# Patient Record
Sex: Male | Born: 1945
Health system: Southern US, Community
[De-identification: ages and names within clinical notes are randomized; demographics above are authoritative.]

## PROBLEM LIST (undated history)

## (undated) DIAGNOSIS — I499 Cardiac arrhythmia, unspecified: Secondary | ICD-10-CM

## (undated) DIAGNOSIS — C801 Malignant (primary) neoplasm, unspecified: Secondary | ICD-10-CM

## (undated) DIAGNOSIS — I350 Nonrheumatic aortic (valve) stenosis: Secondary | ICD-10-CM

## (undated) DIAGNOSIS — Z720 Tobacco use: Secondary | ICD-10-CM

## (undated) DIAGNOSIS — I739 Peripheral vascular disease, unspecified: Secondary | ICD-10-CM

## (undated) DIAGNOSIS — M1990 Unspecified osteoarthritis, unspecified site: Secondary | ICD-10-CM

## (undated) DIAGNOSIS — R011 Cardiac murmur, unspecified: Secondary | ICD-10-CM

## (undated) DIAGNOSIS — I714 Abdominal aortic aneurysm, without rupture, unspecified: Secondary | ICD-10-CM

## (undated) DIAGNOSIS — I480 Paroxysmal atrial fibrillation: Secondary | ICD-10-CM

## (undated) HISTORY — PX: CERVICAL SPINE SURGERY: SHX589

## (undated) HISTORY — DX: Tobacco use: Z72.0

## (undated) HISTORY — DX: Paroxysmal atrial fibrillation: I48.0

## (undated) HISTORY — PX: BACK SURGERY: SHX140

## (undated) HISTORY — PX: EYE SURGERY: SHX253

## (undated) HISTORY — DX: Nonrheumatic aortic (valve) stenosis: I35.0

## (undated) HISTORY — PX: RETINAL DETACHMENT SURGERY: SHX105

## (undated) HISTORY — DX: Peripheral vascular disease, unspecified: I73.9

## (undated) HISTORY — PX: LEG SURGERY: SHX1003

## (undated) HISTORY — DX: Abdominal aortic aneurysm, without rupture, unspecified: I71.40

## (undated) NOTE — *Deleted (*Deleted)
Up and walked and tolerated well 

---

## 2004-04-04 ENCOUNTER — Ambulatory Visit (HOSPITAL_COMMUNITY): Admission: RE | Admit: 2004-04-04 | Discharge: 2004-04-05 | Payer: Self-pay | Admitting: Ophthalmology

## 2004-05-10 ENCOUNTER — Ambulatory Visit (HOSPITAL_COMMUNITY): Admission: RE | Admit: 2004-05-10 | Discharge: 2004-05-10 | Payer: Self-pay | Admitting: Neurosurgery

## 2009-07-14 ENCOUNTER — Ambulatory Visit: Payer: Self-pay | Admitting: Family Medicine

## 2009-07-14 DIAGNOSIS — R9431 Abnormal electrocardiogram [ECG] [EKG]: Secondary | ICD-10-CM | POA: Insufficient documentation

## 2009-08-15 ENCOUNTER — Telehealth (INDEPENDENT_AMBULATORY_CARE_PROVIDER_SITE_OTHER): Payer: Self-pay | Admitting: *Deleted

## 2009-08-16 ENCOUNTER — Encounter (HOSPITAL_COMMUNITY): Admission: RE | Admit: 2009-08-16 | Discharge: 2009-10-10 | Payer: Self-pay | Admitting: Family Medicine

## 2009-08-16 ENCOUNTER — Ambulatory Visit: Payer: Self-pay | Admitting: Internal Medicine

## 2009-08-16 ENCOUNTER — Ambulatory Visit: Payer: Self-pay

## 2010-09-03 LAB — CONVERTED CEMR LAB
BUN: 10 mg/dL (ref 6–23)
CO2: 31 meq/L (ref 19–32)
Calcium: 9 mg/dL (ref 8.4–10.5)
Chloride: 107 meq/L (ref 96–112)
Cholesterol: 190 mg/dL (ref 0–200)
Creatinine, Ser: 1 mg/dL (ref 0.4–1.5)
GFR calc non Af Amer: 79.6 mL/min (ref >60)
Glucose, Bld: 104 mg/dL — ABNORMAL HIGH (ref 70–99)
HDL: 38.8 mg/dL — ABNORMAL LOW (ref >39.00)
LDL Cholesterol: 136 mg/dL — ABNORMAL HIGH (ref 0–99)
PSA: 0.97 ng/mL (ref 0.10–4.00)
Potassium: 4.9 meq/L (ref 3.5–5.1)
Sodium: 142 meq/L (ref 135–145)
Total CHOL/HDL Ratio: 5
Triglycerides: 74 mg/dL (ref 0.0–149.0)
VLDL: 14.8 mg/dL (ref 0.0–40.0)

## 2010-09-06 NOTE — Progress Notes (Signed)
Summary: Nuclear Pre-Procedure  Phone Note Outgoing Call Call back at [facility] Phone [phone]   Call placed by: Stanton Kidney, EMT-P,  August 15, 2009 2:17 PM Action Taken: Phone Call Completed Summary of Call: Left message with information on Myoview Information Sheet (see scanned document for details).      Nuclear Med Background Indications for Stress Test: Evaluation for Ischemia, Abnormal EKG        Nuclear Pre-Procedure Cardiac Risk Factors: Smoker Height (in): 74.34

## 2010-09-06 NOTE — Assessment & Plan Note (Signed)
Summary: Cardiology Nuclear Study  Nuclear Med Background Indications for Stress Test: Evaluation for Ischemia, Abnormal EKG     Symptoms: Chest Pressure, Palpitations  Symptoms Comments: Last Chest pressure yesterday.   Nuclear Pre-Procedure Cardiac Risk Factors: Smoker Caffeine/Decaff Intake: None NPO After: 12:00 AM Lungs: Clear IV 0.9% NS with Angio Cath: 20g     IV Site: (R) AC IV Started by: Irean Hong RN Chest Size (in) 44     Height (in): 75.5 Weight (lb): 218 BMI: 26.99  Nuclear Med Study 1 or 2 day study:  1 day     Stress Test Type:  Stress Reading MD:  Arvilla Meres, MD     Referring MD:  Evelena Peat Resting Radionuclide:  Technetium 82m Tetrofosmin     Resting Radionuclide Dose:  10.9 mCi  Stress Radionuclide:  Technetium 68m Tetrofosmin     Stress Radionuclide Dose:  33.0 mCi   Stress Protocol Exercise Time (min):  6:00 min     Max HR:  148 bpm     Predicted Max HR:  155 bpm  Max Systolic BP: 198 mm Hg     Percent Max HR:  95.48 %     METS: 7.0 Rate Pressure Product:  16109    Stress Test Technologist:  Irean Hong RN     Nuclear Technologist:  [name] CNMT  Rest Procedure  Myocardial perfusion imaging was performed at rest 45 minutes following the intravenous administration of Myoview Technetium 25m Tetrofosmin.  Stress Procedure  The patient exercised for six minutes.  The patient stopped due to DOE, marked fatigue, and denied any chest pain.  There were no significant ST-T wave changes, rare PVC,PJC.  Myoview was injected at peak exercise and myocardial perfusion imaging was performed after a brief delay.  QPS Raw Data Images:  Normal; no motion artifact; normal heart/lung ratio. Stress Images:  Mildly decreased uptake in inferior wall Rest Images:  Mildly decreased uptake in inferior wall Subtraction (SDS):  Mild fixed defect in inferior wall likely diaphragmatic attenutation Transient Ischemic Dilatation:  .92  (Normal <1.22)  Lung/Heart Ratio:  .31  (Normal <0.45)  Quantitative Gated Spect Images QGS EDV:  120 ml QGS ESV:  44 ml QGS EF:  63 % QGS cine images:  normal  Findings Normal nuclear study      Overall Impression  Exercise Capacity: Fair exercise capacity. BP Response: Hypertensive blood pressure response. Clinical Symptoms: There is dyspnea. ECG Impression: No significant ST segment change suggestive of ischemia. Overall Impression: Normal stress nuclear study. Overall Impression Comments: Mildly decreased uptake in inferior wall likely diaphragmatic attenuation. No ischemia.  Appended Document: Cardiology Nuclear Study pt notified of neg stress test.  He is doing well at this time and aymptomatic.

## 2010-12-22 NOTE — Op Note (Signed)
Cory [name], Cory [name]                 ACCOUNT NO.:  [id number]   MEDICAL RECORD NO.:  [id number]          PATIENT TYPE:  OIB   LOCATION:  2889                         FACILITY:  [facility]   PHYSICIAN:  Coletta Memos, M.D.     DATE OF BIRTH:  06-Feb-1946   DATE OF PROCEDURE:  05/10/2004  DATE OF DISCHARGE:                                 OPERATIVE REPORT   PREOPERATIVE DIAGNOSES:  1.  Cervical spondylosis, C5-6, C6-7.  2.  Cervical radiculopathy, C7, C6 left side.  3.  Neck pain.   POSTOPERATIVE DIAGNOSES:  1.  Cervical spondylosis, C5-6, C6-7.  2.  Cervical radiculopathy, C7, C6 left side.  3.  Neck pain.   OPERATION PERFORMED:  Anterior cervical decompression, C5-6, C6-7; anterior  arthrodesis, C5 to C7 with 6 mm allograft between C5 and C6, 8 mm allograft  between C6 and 7.  Anterior instrumentation, 34 mm Vectra plate.   SURGEON:  Coletta Memos, M.D.   ASSISTANT:  Clydene Fake, M.D.   ANESTHESIA:  General.   COMPLICATIONS:  None.   INDICATIONS FOR PROCEDURE:  Cory [name] is a 65 year old who presented with  weakness in the left triceps.  MRI showed significant foraminal stenosis on  the left side at 5-6 and 6-7.  I recommended that he undergo a two-level  anterior cervical decompression.  The patient agreed.   DESCRIPTION OF PROCEDURE:  Cory [name] was brought to the operating room,  intubated, and placed under general anesthetic without difficulty.  He was  positioned on a horseshoe head rest and 10 pounds of tractions applied via  chin strap.  His neck was prepped and he was draped in a sterile fashion.  I  infiltrated 2 mL 0.5% lidocaine, starting at the midline, extending to the  medial border of the left sternocleidomastoid just below the thyroid  cartilage.  I opened the skin with a #10 blade and I took this initial  incision down to the level of the platysma.  I dissected rostrally and  caudally above the platysma.  I opened the platysma in a horizontal fashion  and  then dissected caudally inferior to the platysma.  I cauterized and  ligated some veins which were overlying my exposure.  Then using a peanut  and hand held retractor, I was able to identify the anterior cervical spine.  He had an impressive large osteophyte at C5-6 which I did take down using a  Leksell rongeur.  I then reflected the longus coli muscles bilaterally from  C5 to C7.  I placed self-retaining Caspar retractor and then placed two  distraction pins, one in C5, the other in C6.  I used a 15 blade and opened  the disk space and then using curettes, pituitary rongeurs and Kerrison  punches, proceeded with a progressive diskectomy.  After the disk was  removed, I used a high speed drill to drill down what were fairly large  osteophytes on the right and left sides.  After I had fully decompressed  both nerve roots and sewed the takeoffs easily, I then placed a 6 mm piece  of bone into the disk space after shaping the edges of the disk space to fit  the bone.  I then removed the distraction pin from C5 and retractor blades.  I reset the apparatus to expose C6-7 and I then placed distraction pin in  C7.  I distracted the disk space and again using a 15 blade, opened the disk  space and removed some of the superficial disk material.  However, there was  significant osteophyte off of the inferior aspect of C6 and using a curette,  I was able to remove that, exposing more of the disk space as a whole.  I  did use the drill extensively at C6-7 level as there was a large amount of  bony encroachment on the neural foramen at 6-7 especially on the left side.  The right side it was present also but I was able to fully decompress that  nerve root as I did on the left side. I did leave overlying soft tissue as  it appeared to contain a fair number of epidural veins and I was not able to  detect any pressure on the nerve.  Dr. Phoebe Perch assisted with the  decompression.  I then placed an 8 mm piece  of bone between C6-7.  I removed  the distraction pins.  I then removed the weight from the neck.  A plate was  then sized and placed, two screws in C5, two in C6, two in C7 without  difficulty.  Each screw was placed first by drilling a hole and using self-  tapping screws.  I irrigated the wound.  Controlled some bleeding with  Gelfoam.  X-ray showed the plate, screws and plug to be in good position.  I  then closed the wound with Dr. Doreen Beam assistance.  I reapproximated the  platysma and subcutaneous tissues.  Dermabond was used for a sterile  dressing.  The patient tolerated the procedure well.       KC/MEDQ  D:  05/10/2004  T:  05/10/2004  Job:  [id number]

## 2013-04-01 ENCOUNTER — Ambulatory Visit (INDEPENDENT_AMBULATORY_CARE_PROVIDER_SITE_OTHER): Payer: Self-pay | Admitting: Family Medicine

## 2013-04-01 ENCOUNTER — Encounter: Payer: Self-pay | Admitting: Family Medicine

## 2013-04-01 VITALS — BP 142/82 | HR 81 | Temp 97.9°F | Wt 219.0 lb

## 2013-04-01 DIAGNOSIS — Z23 Encounter for immunization: Secondary | ICD-10-CM

## 2013-04-01 DIAGNOSIS — R0789 Other chest pain: Secondary | ICD-10-CM

## 2013-04-01 DIAGNOSIS — R1013 Epigastric pain: Secondary | ICD-10-CM

## 2013-04-01 LAB — CBC WITH DIFFERENTIAL/PLATELET
Basophils Absolute: 0 10*3/uL (ref 0.0–0.1)
Basophils Relative: 0.5 % (ref 0.0–3.0)
Eosinophils Absolute: 0.2 10*3/uL (ref 0.0–0.7)
Eosinophils Relative: 1.9 % (ref 0.0–5.0)
HCT: 48.1 % (ref 39.0–52.0)
Hemoglobin: 16.5 g/dL (ref 13.0–17.0)
Lymphocytes Relative: 26.9 % (ref 12.0–46.0)
Lymphs Abs: 2.5 10*3/uL (ref 0.7–4.0)
MCHC: 34.3 g/dL (ref 30.0–36.0)
MCV: 94 fl (ref 78.0–100.0)
Monocytes Absolute: 0.7 10*3/uL (ref 0.1–1.0)
Monocytes Relative: 7.5 % (ref 3.0–12.0)
Neutro Abs: 5.8 10*3/uL (ref 1.4–7.7)
Neutrophils Relative %: 63.2 % (ref 43.0–77.0)
Platelets: 216 10*3/uL (ref 150.0–400.0)
RBC: 5.12 Mil/uL (ref 4.22–5.81)
RDW: 13.5 % (ref 11.5–14.6)
WBC: 9.2 10*3/uL (ref 4.5–10.5)

## 2013-04-01 NOTE — Addendum Note (Signed)
Addended by: Thomasena Edis on: 04/01/2013 01:11 PM   Modules accepted: Orders

## 2013-04-01 NOTE — Progress Notes (Signed)
  Subjective:    Patient ID: Cory [name], male    DOB: 1945-09-12, 67 y.o.   MRN: [id number]  HPI Patient not been seen in several years. Is a one pack per day cigarette smoker seen with approximately 2 week history of some vague epigastric discomfort Symptoms are intermittent. He describes some intermittent gaseous discomfort and intermittent burping. No clear exacerbating factors. Not related activity. He has occasional chest pressure but not related to activity. Nuclear stress test 2011 which was normal. No cardiac history. No dysphagia  He relates normal appetite and no relation of symptoms to eating. No stool changes. No nausea or vomiting. No weight changes. Denies any typical GERD symptoms. No cough. No pleuritic pain.  Patient has history of low HDL. No history of diabetes. No history of hypertension. No family history of premature CAD.  No past medical history on file. No past surgical history on file.  reports that he has been smoking.  He does not have any smokeless tobacco history on file. He reports that  drinks alcohol. He reports that he does not use illicit drugs. family history is not on file. No Known Allergies    Review of Systems  Constitutional: Negative for fever, chills, appetite change, fatigue and unexpected weight change.  HENT: Negative for trouble swallowing.   Respiratory: Negative for cough and shortness of breath.   Cardiovascular: Negative for leg swelling.  Gastrointestinal: Positive for abdominal pain. Negative for nausea, vomiting, diarrhea and blood in stool.  Endocrine: Negative for polydipsia and polyuria.  Genitourinary: Negative for dysuria.  Neurological: Negative for dizziness.       Objective:   Physical Exam  Constitutional: He appears well-developed and well-nourished. No distress.  HENT:  Mouth/Throat: Oropharynx is clear and moist.  Cardiovascular: Normal rate and regular rhythm.   Pulmonary/Chest: Effort normal and breath sounds  normal. No respiratory distress. He has no wheezes. He has no rales.  Abdominal: Soft. Bowel sounds are normal. He exhibits no distension and no mass. There is no tenderness. There is no rebound and no guarding.  Genitourinary:  No inguinal hernia  Musculoskeletal: He exhibits no edema.          Assessment & Plan:  Patient presents with vague epigastric discomfort. No clear exacerbating factors. He has not had any dyspnea or exertional-type symptoms but at high risk for CAD. Nuclear stress test 2011 normal. Symptoms are not typical for GERD. Check EKG. Check lab work. Abdominal ultrasound. Consider upper GI if all above normal.  EKG reveals sinus rhythm with no acute changes

## 2013-04-01 NOTE — Patient Instructions (Addendum)
Smoking Cessation Quitting smoking is important to your health and has many advantages. However, it is not always easy to quit since nicotine is a very addictive drug. Often times, people try 3 times or more before being able to quit. This document explains the best ways for you to prepare to quit smoking. Quitting takes hard work and a lot of effort, but you can do it. ADVANTAGES OF QUITTING SMOKING  You will live longer, feel better, and live better.  Your body will feel the impact of quitting smoking almost immediately.  Within 20 minutes, blood pressure decreases. Your pulse returns to its normal level.  After 8 hours, carbon monoxide levels in the blood return to normal. Your oxygen level increases.  After 24 hours, the chance of having a heart attack starts to decrease. Your breath, hair, and body stop smelling like smoke.  After 48 hours, damaged nerve endings begin to recover. Your sense of taste and smell improve.  After 72 hours, the body is virtually free of nicotine. Your bronchial tubes relax and breathing becomes easier.  After 2 to 12 weeks, lungs can hold more air. Exercise becomes easier and circulation improves.  The risk of having a heart attack, stroke, cancer, or lung disease is greatly reduced.  After 1 year, the risk of coronary heart disease is cut in half.  After 5 years, the risk of stroke falls to the same as a nonsmoker.  After 10 years, the risk of lung cancer is cut in half and the risk of other cancers decreases significantly.  After 15 years, the risk of coronary heart disease drops, usually to the level of a nonsmoker.  If you are pregnant, quitting smoking will improve your chances of having a healthy baby.  The people you live with, especially any children, will be healthier.  You will have extra money to spend on things other than cigarettes. QUESTIONS TO THINK ABOUT BEFORE ATTEMPTING TO QUIT You may want to talk about your answers with  your caregiver.  Why do you want to quit?  If you tried to quit in the past, what helped and what did not?  What will be the most difficult situations for you after you quit? How will you plan to handle them?  Who can help you through the tough times? Your family? Friends? A caregiver?  What pleasures do you get from smoking? What ways can you still get pleasure if you quit? Here are some questions to ask your caregiver:  How can you help me to be successful at quitting?  What medicine do you think would be best for me and how should I take it?  What should I do if I need more help?  What is smoking withdrawal like? How can I get information on withdrawal? GET READY  Set a quit date.  Change your environment by getting rid of all cigarettes, ashtrays, matches, and lighters in your home, car, or work. Do not let people smoke in your home.  Review your past attempts to quit. Think about what worked and what did not. GET SUPPORT AND ENCOURAGEMENT You have a better chance of being successful if you have help. You can get support in many ways.  Tell your family, friends, and co-workers that you are going to quit and need their support. Ask them not to smoke around you.  Get individual, group, or telephone counseling and support. Programs are available at [employer] and health centers. Call your local health  department for information about programs in your area.  Spiritual beliefs and practices may help some smokers quit.  Download a "quit meter" on your computer to keep track of quit statistics, such as how long you have gone without smoking, cigarettes not smoked, and money saved.  Get a self-help book about quitting smoking and staying off of tobacco. LEARN NEW SKILLS AND BEHAVIORS  Distract yourself from urges to smoke. Talk to someone, go for a walk, or occupy your time with a task.  Change your normal routine. Take a different route to work. Drink tea instead of  coffee. Eat breakfast in a different place.  Reduce your stress. Take a hot bath, exercise, or read a book.  Plan something enjoyable to do every day. Reward yourself for not smoking.  Explore interactive web-based programs that specialize in helping you quit. GET MEDICINE AND USE IT CORRECTLY Medicines can help you stop smoking and decrease the urge to smoke. Combining medicine with the above behavioral methods and support can greatly increase your chances of successfully quitting smoking.  Nicotine replacement therapy helps deliver nicotine to your body without the negative effects and risks of smoking. Nicotine replacement therapy includes nicotine gum, lozenges, inhalers, nasal sprays, and skin patches. Some may be available over-the-counter and others require a prescription.  Antidepressant medicine helps people abstain from smoking, but how this works is unknown. This medicine is available by prescription.  Nicotinic receptor partial agonist medicine simulates the effect of nicotine in your brain. This medicine is available by prescription. Ask your caregiver for advice about which medicines to use and how to use them based on your health history. Your caregiver will tell you what side effects to look out for if you choose to be on a medicine or therapy. Carefully read the information on the package. Do not use any other product containing nicotine while using a nicotine replacement product.  RELAPSE OR DIFFICULT SITUATIONS Most relapses occur within the first 3 months after quitting. Do not be discouraged if you start smoking again. Remember, most people try several times before finally quitting. You may have symptoms of withdrawal because your body is used to nicotine. You may crave cigarettes, be irritable, feel very hungry, cough often, get headaches, or have difficulty concentrating. The withdrawal symptoms are only temporary. They are strongest when you first quit, but they will go away  within 10 14 days. To reduce the chances of relapse, try to:  Avoid drinking alcohol. Drinking lowers your chances of successfully quitting.  Reduce the amount of caffeine you consume. Once you quit smoking, the amount of caffeine in your body increases and can give you symptoms, such as a rapid heartbeat, sweating, and anxiety.  Avoid smokers because they can make you want to smoke.  Do not let weight gain distract you. Many smokers will gain weight when they quit, usually less than 10 pounds. Eat a healthy diet and stay active. You can always lose the weight gained after you quit.  Find ways to improve your mood other than smoking. FOR MORE INFORMATION  www.smokefree.gov  Document Released: 07/17/2001 Document Revised: 01/22/2012 Document Reviewed: 11/01/2011 [facility] Patient Information 2014 [place], [place].  Followup promptly for exertional chest pain, increased shortness of breath, or worsening abdominal pain

## 2013-04-02 LAB — HEPATIC FUNCTION PANEL
ALT: 18 U/L (ref 0–53)
AST: 17 U/L (ref 0–37)
Albumin: 3.9 g/dL (ref 3.5–5.2)
Alkaline Phosphatase: 39 U/L (ref 39–117)
Bilirubin, Direct: 0.1 mg/dL (ref 0.0–0.3)
Total Bilirubin: 0.5 mg/dL (ref 0.3–1.2)
Total Protein: 6.8 g/dL (ref 6.0–8.3)

## 2013-04-02 LAB — BASIC METABOLIC PANEL
BUN: 12 mg/dL (ref 6–23)
CO2: 25 mEq/L (ref 19–32)
Calcium: 8.7 mg/dL (ref 8.4–10.5)
Chloride: 104 mEq/L (ref 96–112)
Creatinine, Ser: 0.9 mg/dL (ref 0.4–1.5)
GFR: 87.77 mL/min (ref >60.00)
Glucose, Bld: 94 mg/dL (ref 70–99)
Potassium: 3.7 mEq/L (ref 3.5–5.1)
Sodium: 137 mEq/L (ref 135–145)

## 2013-04-02 LAB — LIPASE: Lipase: 28 U/L (ref 11.0–59.0)

## 2013-04-09 ENCOUNTER — Telehealth: Payer: Self-pay

## 2013-04-09 ENCOUNTER — Ambulatory Visit
Admission: RE | Admit: 2013-04-09 | Discharge: 2013-04-09 | Disposition: A | Discharge status: Home / Self Care | Payer: Medicare Other | Source: Ambulatory Visit | Attending: Family Medicine | Admitting: Family Medicine

## 2013-04-09 DIAGNOSIS — R1013 Epigastric pain: Secondary | ICD-10-CM

## 2013-04-09 NOTE — Telephone Encounter (Signed)
Pt stated that he is doing fine and he hasn't been having any pain.

## 2014-04-01 ENCOUNTER — Ambulatory Visit (INDEPENDENT_AMBULATORY_CARE_PROVIDER_SITE_OTHER): Payer: Commercial Managed Care - HMO | Admitting: Family Medicine

## 2014-04-01 ENCOUNTER — Encounter: Payer: Self-pay | Admitting: Family Medicine

## 2014-04-01 VITALS — BP 138/84 | HR 74 | Temp 98.0°F | Wt 224.0 lb

## 2014-04-01 DIAGNOSIS — M170 Bilateral primary osteoarthritis of knee: Secondary | ICD-10-CM

## 2014-04-01 DIAGNOSIS — M1611 Unilateral primary osteoarthritis, right hip: Secondary | ICD-10-CM

## 2014-04-01 DIAGNOSIS — M1710 Unilateral primary osteoarthritis, unspecified knee: Secondary | ICD-10-CM

## 2014-04-01 DIAGNOSIS — M1610 Unilateral primary osteoarthritis, unspecified hip: Secondary | ICD-10-CM

## 2014-04-01 MED ORDER — MELOXICAM 15 MG PO TABS: 15.0000 mg | ORAL_TABLET | Freq: Every day | ORAL | Status: DC

## 2014-04-01 NOTE — Progress Notes (Signed)
   Subjective:    Patient ID: Cory [name], male    DOB: 26-Sep-1945, 68 y.o.   MRN: [id number]  Hip Pain   Knee Pain    Progressive right hip and bilateral knee pains over the past several months. His pain is worse at night. He has early morning stiffness which improves through the day. He's taken Tylenol without much relief. He has recently been taking lots of ibuprofen which does help somewhat. No recent injury. He has not seen any knee effusions. No fevers or chills. No recent injury. Pain is moderate at night Denies any significant upper extremity arthritic pains  No past medical history on file. No past surgical history on file.  reports that he has been smoking.  He does not have any smokeless tobacco history on file. He reports that he drinks alcohol. He reports that he does not use illicit drugs. family history is not on file. No Known Allergies    Review of Systems  Constitutional: Negative for fever and chills.  Musculoskeletal: Positive for arthralgias. Negative for back pain and myalgias.  Skin: Negative for rash.  Hematological: Negative for adenopathy.       Objective:   Physical Exam  Constitutional: He appears well-developed and well-nourished. No distress.  Neck: Neck supple. No thyromegaly present.  Cardiovascular: Normal rate and regular rhythm.   Pulmonary/Chest: Effort normal and breath sounds normal. No respiratory distress. He has no wheezes. He has no rales.  Musculoskeletal: He exhibits no edema.  He has fairly good range of motion right hip as well as both knees. has mild crepitus with flexion and extension of both knees. No right lateral hip tenderness.          Assessment & Plan:  Probable osteoarthritis involving right hip and both knees. We discussed appropriate exercises as tolerated such as cycling. Cautious trial of meloxicam 15 mg once daily. We reviewed possible side effects. Consider x-rays right hip and knee if not improving over the  next few weeks. Also discussed other possible alternatives such as tramadol which he will consider if not getting adequate relief with meloxicam

## 2014-04-01 NOTE — Patient Instructions (Signed)

## 2014-04-01 NOTE — Progress Notes (Signed)
Pre visit review using our clinic review tool, if applicable. No additional management support is needed unless otherwise documented below in the visit note. 

## 2014-06-11 ENCOUNTER — Ambulatory Visit (INDEPENDENT_AMBULATORY_CARE_PROVIDER_SITE_OTHER): Payer: Commercial Managed Care - HMO | Admitting: Family Medicine

## 2014-06-11 ENCOUNTER — Encounter: Payer: Self-pay | Admitting: Family Medicine

## 2014-06-11 DIAGNOSIS — M7071 Other bursitis of hip, right hip: Secondary | ICD-10-CM

## 2014-06-11 MED ORDER — METHYLPREDNISOLONE ACETATE 40 MG/ML IJ SUSP
20.0000 mg | Freq: Once | INTRAMUSCULAR | Status: AC
  Administered 2014-06-11: 20 mg via INTRA_ARTICULAR

## 2014-06-11 NOTE — Progress Notes (Signed)
   Subjective:    Patient ID: Cory [name], male    DOB: [birth year], 68 y.o.   MRN: [id number]  HPI Patient seen with some persistent right lateral hip pains. No recent fall. Has been taking meloxicam past couple months without much improvement. Heat helps temporarily. He denies any lumbar back pain. Pain radiates slightly toward the mid aspect of the thigh laterally. Has never had any rashes. No numbness or weakness lower extremity. No right medial thigh pain.  No past medical history on file. No past surgical history on file.  reports that he has been smoking.  He does not have any smokeless tobacco history on file. He reports that he drinks alcohol. He reports that he does not use illicit drugs. family history is not on file. No Known Allergies    Review of Systems  Constitutional: Negative for fever and chills.       Objective:   Physical Exam  Constitutional: He appears well-developed and well-nourished.  Cardiovascular: Normal rate and regular rhythm.   Musculoskeletal:  He continues to have good range of motion right hip. There some tenderness localized over the greater trochanteric bursa region. Straight leg raise is negative. Deep tendon reflexes are 2+ knee and ankle bilaterally. No strength deficits lower extremity          Assessment & Plan:  Right greater trochanteric bursitis. He has not responded to non-steroidals. Recommend icing and discussed risk and benefits of corticosteroid injection and patient consented.  Right lateral hip prepped with betadine.  Using sterile technique, injected 40 mg depomedrol and 2 cc plain xylocaine using 25 gauge 1 inch needle into bursa region.  Pt tolerated well.

## 2014-06-11 NOTE — Patient Instructions (Signed)
Hip Bursitis Bursitis is a swelling and soreness (inflammation) of a fluid-filled sac (bursa). This sac overlies and protects the joints.  CAUSES   Injury.  Overuse of the muscles surrounding the joint.  Arthritis.  Gout.  Infection.  Cold weather.  Inadequate warm-up and conditioning prior to activities. The cause may not be known.  SYMPTOMS   Mild to severe irritation.  Tenderness and swelling over the outside of the hip.  Pain with motion of the hip.  If the bursa becomes infected, a fever may be present. Redness, tenderness, and warmth will develop over the hip. Symptoms usually lessen in 3 to 4 weeks with treatment, but can come back. TREATMENT If conservative treatment does not work, your caregiver may advise draining the bursa and injecting cortisone into the area. This may speed up the healing process. This may also be used as an initial treatment of choice. HOME CARE INSTRUCTIONS   Apply ice to the affected area for 15-20 minutes every 3 to 4 hours while awake for the first 2 days. Put the ice in a plastic bag and place a towel between the bag of ice and your skin.  Rest the painful joint as much as possible, but continue to put the joint through a normal range of motion at least 4 times per day. When the pain lessens, begin normal, slow movements and usual activities to help prevent stiffness of the hip.  Only take over-the-counter or prescription medicines for pain, discomfort, or fever as directed by your caregiver.  Use crutches to limit weight bearing on the hip joint, if advised.  Elevate your painful hip to reduce swelling. Use pillows for propping and cushioning your legs and hips.  Gentle massage may provide comfort and decrease swelling. SEEK IMMEDIATE MEDICAL CARE IF:   Your pain increases even during treatment, or you are not improving.  You have a fever.  You have heat and inflammation over the involved bursa.  You have any other questions or  concerns. MAKE SURE YOU:   Understand these instructions.  Will watch your condition.  Will get help right away if you are not doing well or get worse. Document Released: 01/12/2002 Document Revised: 10/15/2011 Document Reviewed: 08/11/2008 [employer] Patient Information 2015 [employer]. This information is not intended to replace advice given to you by your health care provider. Make sure you discuss any questions you have with your health care provider.  

## 2014-06-11 NOTE — Progress Notes (Signed)
Pre visit review using our clinic review tool, if applicable. No additional management support is needed unless otherwise documented below in the visit note. 

## 2014-06-14 ENCOUNTER — Telehealth: Payer: Self-pay | Admitting: Family Medicine

## 2014-06-14 NOTE — Telephone Encounter (Signed)
emmi mailed  °

## 2014-08-09 ENCOUNTER — Ambulatory Visit (INDEPENDENT_AMBULATORY_CARE_PROVIDER_SITE_OTHER): Payer: Commercial Managed Care - HMO | Admitting: Family Medicine

## 2014-08-09 ENCOUNTER — Encounter: Payer: Self-pay | Admitting: Family Medicine

## 2014-08-09 ENCOUNTER — Ambulatory Visit (INDEPENDENT_AMBULATORY_CARE_PROVIDER_SITE_OTHER): Payer: Commercial Managed Care - HMO

## 2014-08-09 VITALS — BP 142/90 | HR 88 | Temp 98.2°F | Wt 226.0 lb

## 2014-08-09 DIAGNOSIS — Z23 Encounter for immunization: Secondary | ICD-10-CM

## 2014-08-09 DIAGNOSIS — H6692 Otitis media, unspecified, left ear: Secondary | ICD-10-CM

## 2014-08-09 MED ORDER — AMOXICILLIN 875 MG PO TABS: 875.0000 mg | ORAL_TABLET | Freq: Two times a day (BID) | ORAL | Status: DC

## 2014-08-09 NOTE — Progress Notes (Signed)
Pre visit review using our clinic review tool, if applicable. No additional management support is needed unless otherwise documented below in the visit note. 

## 2014-08-09 NOTE — Patient Instructions (Signed)

## 2014-08-09 NOTE — Progress Notes (Signed)
   Subjective:    Patient ID: Cory [name], male    DOB: 1946-05-25, 69 y.o.   MRN: [id number]  HPI Patient seen with left earache. Started about yesterday. He has not had any fever or dizziness. No hearing changes. Slightly better today. He's had some sensation of pressure and fullness of left ear. No right ear symptoms. Denies any recent nasal congestive symptoms   Review of Systems  Constitutional: Negative for fever and chills.  HENT: Positive for ear pain. Negative for congestion.        Objective:   Physical Exam  Constitutional: He appears well-developed and well-nourished.  HENT:  Right Ear: External ear normal.  Mouth/Throat: Oropharynx is clear and moist.  Left eardrum is slightly retracted and moderately erythematous  Neck: Neck supple.  Cardiovascular: Normal rate and regular rhythm.   Pulmonary/Chest: Effort normal and breath sounds normal.  Lymphadenopathy:    He has no cervical adenopathy.          Assessment & Plan:  Left otalgia. Possible early left suppurative otitis. Amoxicillin 875 mg twice daily for 10 days. Follow-up as needed if symptoms persist

## 2015-01-10 ENCOUNTER — Telehealth: Payer: Self-pay | Admitting: Internal Medicine

## 2015-01-10 ENCOUNTER — Encounter: Payer: Self-pay | Admitting: Family Medicine

## 2015-01-10 ENCOUNTER — Ambulatory Visit (INDEPENDENT_AMBULATORY_CARE_PROVIDER_SITE_OTHER): Payer: Commercial Managed Care - HMO | Admitting: Family Medicine

## 2015-01-10 VITALS — BP 136/88 | HR 79 | Temp 98.2°F | Ht 75.5 in | Wt 224.8 lb

## 2015-01-10 DIAGNOSIS — R9431 Abnormal electrocardiogram [ECG] [EKG]: Secondary | ICD-10-CM

## 2015-01-10 DIAGNOSIS — R002 Palpitations: Secondary | ICD-10-CM | POA: Diagnosis not present

## 2015-01-10 DIAGNOSIS — J988 Other specified respiratory disorders: Secondary | ICD-10-CM

## 2015-01-10 MED ORDER — DOXYCYCLINE HYCLATE 100 MG PO TABS: 100.0000 mg | ORAL_TABLET | Freq: Two times a day (BID) | ORAL | Status: DC

## 2015-01-10 NOTE — Progress Notes (Signed)
HPI:  Racing heart: - episode of palpitations/felt heart racing 2 days ago - symptoms lasted for 10-15 minutes after working out in the heat ( without CP, chest pressure, nausea, jaw pain, arm pain or associated SOB ) but feels more fatigued since -no episodes of palpitations or heart racing since -hx of intermittent palpitations remotely -started a few weeks ago with a "cold" but has not gotten better -other symptoms: cough, congestion, drainage -denies: SOB, CP, DOE, fevers, wheezing, swelling of legs, travel, hx of COPD -smoker  ROS: See pertinent positives and negatives per HPI.   No past medical history on file.  No past surgical history on file.  No family history on file.  History   Social History  . Marital Status: Married    Spouse Name: N/A  . Number of Children: N/A  . Years of Education: N/A   Social History Main Topics  . Smoking status: Current Every Day Smoker  . Smokeless tobacco: Not on file  . Alcohol Use: Yes  . Drug Use: No  . Sexual Activity: Not on file   Other Topics Concern  . None   Social History Narrative     Current outpatient prescriptions:  .  doxycycline (VIBRA-TABS) 100 MG tablet, Take 1 tablet (100 mg total) by mouth 2 (two) times daily., Disp: 20 tablet, Rfl: 0 .  loratadine (CLARITIN) 10 MG tablet, Take 10 mg by mouth as needed for allergies., Disp: , Rfl:   EXAM:  Filed Vitals:   01/10/15 1549  BP: 136/88  Pulse: 79  Temp: 98.2 F (36.8 C)    Body mass index is 27.72 kg/(m^2).  GENERAL: vitals reviewed and listed above, alert, oriented, appears well hydrated and in no acute distress  HEENT: atraumatic, conjunttiva clear, no obvious abnormalities on inspection of external nose and ears, normal appearance of ear canals and TMs, thick nasal congestion, mild post oropharyngeal erythema with PND, no tonsillar edema or exudate, no sinus TTP  NECK: no obvious masses on inspection  LUNGS: rhoncherous breath sound L base, a  little better after coughing  CV: HRRR, I/VI SEM, no peripheral edema  MS: moves all extremities without noticeable abnormality  PSYCH: pleasant and cooperative, no obvious depression or anxiety  ASSESSMENT AND PLAN:  Discussed the following assessment and plan:  Respiratory infection - Plan: DG Chest 2 View  Palpitations - Plan: EKG 12-Lead  Abnormal EKG  -start doxy for the resp symptoms and concern for possibly resp infection, CXR pending -discussed his EKG and symptoms with Dr. Rayann Heman (cards on call doc) and he reviewed this and advised feels unlikely ischemic- he graciously has offered to get pt in to see one of their providers in the next week and said his staff will call pt but to have pt call them if has not received a call - relayed this to pt -return and ED precautions -follow up with PCP in 3-4 weeks -Patient advised to return or notify a doctor immediately if symptoms worsen or persist or new concerns arise.  Patient Instructions  BEFORE YOU LEAVE: -EKG -xray sheet -follow up with your doctor in 3-4 weeks  Go get the chest xray  Take the antibiotic as instructed  Please quit smoking  Follow up as instructed and sooner if worsening or new concerns or not improving  The cardiologist will be calling you about an appointment - if you have not heard from them in the next 4 days, please call their office per the number provided.  Cory [name] R.

## 2015-01-10 NOTE — Progress Notes (Signed)
Pre visit review using our clinic review tool, if applicable. No additional management support is needed unless otherwise documented below in the visit note. 

## 2015-01-10 NOTE — Patient Instructions (Addendum)
BEFORE YOU LEAVE: -EKG -xray sheet -follow up with your doctor in 3-4 weeks  Go get the chest xray  Take the antibiotic as instructed  Please quit smoking  Follow up as instructed and sooner if worsening or new concerns or not improving  The cardiologist will be calling you about an appointment - if you have not heard from them in the next 4 days, please call their office per the number provided.

## 2015-01-10 NOTE — Telephone Encounter (Signed)
Patient seen by PCP (Dr Maudie Mercury) for "pounding heart beat" and fatigue.   I have spoken with Dr Maudie Mercury who would like for him to be seen this week.  Please add to [facility] later this week.

## 2015-01-11 ENCOUNTER — Ambulatory Visit (INDEPENDENT_AMBULATORY_CARE_PROVIDER_SITE_OTHER)
Admission: RE | Admit: 2015-01-11 | Discharge: 2015-01-11 | Disposition: A | Discharge status: Home / Self Care | Payer: Commercial Managed Care - HMO | Source: Ambulatory Visit | Attending: Family Medicine | Admitting: Family Medicine

## 2015-01-11 DIAGNOSIS — R05 Cough: Secondary | ICD-10-CM | POA: Diagnosis not present

## 2015-01-11 DIAGNOSIS — J988 Other specified respiratory disorders: Secondary | ICD-10-CM | POA: Diagnosis not present

## 2015-01-14 ENCOUNTER — Encounter: Payer: Self-pay | Admitting: Cardiology

## 2015-01-14 ENCOUNTER — Ambulatory Visit (INDEPENDENT_AMBULATORY_CARE_PROVIDER_SITE_OTHER): Payer: Commercial Managed Care - HMO | Admitting: Cardiology

## 2015-01-14 VITALS — BP 146/100 | HR 69 | Ht 76.0 in | Wt 223.9 lb

## 2015-01-14 DIAGNOSIS — R9431 Abnormal electrocardiogram [ECG] [EKG]: Secondary | ICD-10-CM | POA: Diagnosis not present

## 2015-01-14 DIAGNOSIS — Z13220 Encounter for screening for lipoid disorders: Secondary | ICD-10-CM | POA: Diagnosis not present

## 2015-01-14 MED ORDER — VARENICLINE TARTRATE 0.5 MG X 11 & 1 MG X 42 PO MISC: ORAL | Status: DC

## 2015-01-14 NOTE — Progress Notes (Signed)
Cardiology Office Note   Date:  01/14/2015   ID:  Cory [name], DOB [birth year], MRN [id number]  PCP:  Cory Post, MD  Cardiologist:   Cory Breeding, MD   Chief Complaint  Patient presents with  . Palpitations      History of Present Illness: Cory [name] is a 69 y.o. male who presents for evaluation of an abnormal EKG and palpitations. He has had an abnormal EKG with poor R-wave progression in the past. I did see a previous stress test in 2011 demonstrated no evidence of ischemia or infarct. He had a preserved ejection fraction. He does have significant cardiovascular risk factors with tobacco abuse. He really came to see his primary provider because he had palpitations. This happened when he was working in the yard. His heart started racing and it lasted for about 15 minutes. He did not have any irregular beats. He did not have any presyncope or syncope. He has not had shortness of breath, PND or orthopnea. He has not had any chest pressure, neck or arm discomfort. He's had no weight gain or edema.   Past Medical History  Diagnosis Date  . Tobacco abuse     Past Surgical History  Procedure Laterality Date  . Retinal detachment surgery    . Cervical spine surgery    . Leg surgery      Trauma     Current Outpatient Prescriptions  Medication Sig Dispense Refill  . doxycycline (VIBRA-TABS) 100 MG tablet Take 1 tablet (100 mg total) by mouth 2 (two) times daily. 20 tablet 0  . loratadine (CLARITIN) 10 MG tablet Take 10 mg by mouth as needed for allergies.    Marland Kitchen varenicline (CHANTIX STARTING MONTH PAK) 0.5 MG X 11 & 1 MG X 42 tablet Take one 0.5 mg tablet by mouth once daily for 3 days, then increase to one 0.5 mg tablet twice daily for 4 days, then increase to one 1 mg tablet twice daily. 53 tablet 0   No current facility-administered medications for this visit.    Allergies:   Review of patient's allergies indicates no known allergies.    Social History:  The  patient  reports that he has been smoking Cigarettes.  He has a 50 pack-year smoking history. He does not have any smokeless tobacco history on file. He reports that he drinks alcohol. He reports that he does not use illicit drugs.   Family History:  The patient's family history includes Alcoholism in his father; Breast cancer in his sister; Ovarian cancer in his mother and sister.    ROS:  Please see the history of present illness.   Otherwise, review of systems are positive for none.   All other systems are reviewed and negative.    PHYSICAL EXAM: VS:  BP 146/100 mmHg  Pulse 69  Ht 6\' 4"  (1.93 m)  Wt 223 lb 14.4 oz (101.56 kg)  BMI 27.27 kg/m2 , BMI Body mass index is 27.27 kg/(m^2). GENERAL:  Well appearing HEENT:  Pupils equal round and reactive, fundi not visualized, oral mucosa unremarkable NECK:  No jugular venous distention, waveform within normal limits, carotid upstroke brisk and symmetric, no bruits, no thyromegaly LYMPHATICS:  No cervical, inguinal adenopathy LUNGS:  Clear to auscultation bilaterally BACK:  No CVA tenderness CHEST:  Unremarkable HEART:  PMI not displaced or sustained,S1 and S2 within normal limits, no S3, no S4, no clicks, no rubs, no murmurs ABD:  Flat, positive bowel sounds normal in frequency  in pitch, no bruits, no rebound, no guarding, no midline pulsatile mass, no hepatomegaly, no splenomegaly EXT:  2 plus pulses throughout, no edema, no cyanosis no clubbing SKIN:  No rashes no nodules NEURO:  Cranial nerves II through XII grossly intact, motor grossly intact throughout PSYCH:  Cognitively intact, oriented to person place and time    EKG:  EKG is ordered today. The ekg ordered today demonstrates sinus rhythm, rate 69, leftward axis, poor anterior R wave progression probable old anteroseptal infarct, no acute ST-T wave changes.   Recent Labs: No results found for requested labs within last 365 days.    Lipid Panel    Component Value Date/Time     CHOL 190 07/14/2009 0000   TRIG 74.0 07/14/2009 0000   HDL 38.80* 07/14/2009 0000   CHOLHDL 5 07/14/2009 0000   VLDL 14.8 07/14/2009 0000   LDLCALC 136* 07/14/2009 0000      Wt Readings from Last 3 Encounters:  01/14/15 223 lb 14.4 oz (101.56 kg)  01/10/15 224 lb 12.8 oz (101.969 kg)  08/09/14 226 lb (102.513 kg)      Other studies Reviewed: Additional studies/ records that were reviewed today include: Old EKG. Review of the above records demonstrates:  Please see elsewhere in the note.     ASSESSMENT AND PLAN:  ABNORMAL EKG:  The patient's EKG suggests an old anteroseptal MI. However, there is very poor R-wave progression previously. He does have significant risk factors however. He's not having any ongoing symptoms.I will bring the patient back for a POET (Plain Old Exercise Test). This will allow me to screen for obstructive coronary disease, risk stratify and very importantly provide a prescription for exercise.  TOBACCO:  He does want to try Chantix.  We discussed all of the potential side effects and in particular I reviewed the [employer].  He has no depression.  He understands and wishes to try this medication.  HTN: His blood pressure is elevated. He's not been told he had this before. He will likely need some therapy but I will assess this at the time of his treadmill.  RISK REDUCTION:  The patient will have a fasting lipid profile when he returns.     Current medicines are reviewed at length with the patient today.  The patient does not have concerns regarding medicines.  The following changes have been made:  no change  Labs/ tests ordered today include:   Orders Placed This Encounter  Procedures  . Lipid Profile  . Exercise Tolerance Test  . EKG 12-Lead     Disposition:   FU with me as needed    Signed, Cory Breeding, MD  01/14/2015 5:19 PM    [place]

## 2015-01-14 NOTE — Patient Instructions (Addendum)
Your physician recommends that you schedule a follow-up appointment As Needed  Your physician has requested that you have an exercise tolerance test. For further information please visit [email or web address]. Please also follow instruction sheet, as given.  Your physician has recommended you make the following change in your medication: Start Chantix Starter pack follow instructions  Your physician recommends that you return for lab work Fasting lipids

## 2015-02-10 ENCOUNTER — Telehealth (HOSPITAL_COMMUNITY): Payer: Self-pay

## 2015-02-10 NOTE — Telephone Encounter (Signed)
Encounter complete. 

## 2015-02-15 ENCOUNTER — Ambulatory Visit (HOSPITAL_COMMUNITY)
Admission: RE | Admit: 2015-02-15 | Discharge: 2015-02-15 | Disposition: A | Discharge status: Home / Self Care | Payer: Commercial Managed Care - HMO | Source: Ambulatory Visit | Attending: Cardiology | Admitting: Cardiology

## 2015-02-15 DIAGNOSIS — R9431 Abnormal electrocardiogram [ECG] [EKG]: Secondary | ICD-10-CM

## 2015-02-15 DIAGNOSIS — Z13220 Encounter for screening for lipoid disorders: Secondary | ICD-10-CM | POA: Diagnosis not present

## 2015-02-15 LAB — EXERCISE TOLERANCE TEST
Estimated workload: 7.5 METS
Exercise duration (min): 6 min
Exercise duration (sec): 21 s
MPHR: 150 {beats}/min
Peak HR: 131 {beats}/min
Percent HR: 87 %
RPE: 15
Rest HR: 65 {beats}/min

## 2015-02-16 LAB — LIPID PANEL
Cholesterol: 168 mg/dL (ref 0–200)
HDL: 43 mg/dL (ref >=40)
LDL Cholesterol: 104 mg/dL — ABNORMAL HIGH (ref 0–99)
Total CHOL/HDL Ratio: 3.9 Ratio
Triglycerides: 104 mg/dL (ref <150)
VLDL: 21 mg/dL (ref 0–40)

## 2015-03-07 ENCOUNTER — Telehealth: Payer: Self-pay

## 2015-03-07 NOTE — Telephone Encounter (Signed)
Pt walked in today c/o heart palpitations starting around 1pm, pt denies SOB, chest pain and dizziness. Per Dr. Elease Hashimoto ok for pt to make an appointment for tomorrow. Pt scheduled to see Dr. Elease Hashimoto at 9:30 in the morning.

## 2015-03-08 ENCOUNTER — Ambulatory Visit (INDEPENDENT_AMBULATORY_CARE_PROVIDER_SITE_OTHER): Payer: Commercial Managed Care - HMO | Admitting: Family Medicine

## 2015-03-08 ENCOUNTER — Encounter: Payer: Self-pay | Admitting: Family Medicine

## 2015-03-08 VITALS — BP 130/90 | HR 73 | Temp 97.5°F | Wt 217.0 lb

## 2015-03-08 DIAGNOSIS — R000 Tachycardia, unspecified: Secondary | ICD-10-CM | POA: Diagnosis not present

## 2015-03-08 LAB — TSH: TSH: 0.77 u[IU]/mL (ref 0.35–4.50)

## 2015-03-08 MED ORDER — METOPROLOL TARTRATE 25 MG PO TABS: ORAL_TABLET | ORAL | Status: DC

## 2015-03-08 NOTE — Progress Notes (Signed)
Pre visit review using our clinic review tool, if applicable. No additional management support is needed unless otherwise documented below in the visit note. 

## 2015-03-08 NOTE — Progress Notes (Signed)
   Subjective:    Patient ID: Cory [name], male    DOB: May 11, 1946, 69 y.o.   MRN: [id number]  HPI Patient seen with episode yesterday lasting about 2 hours of rapid heart rate associated with some lightheadedness but no syncope. No associated dyspnea. Recent history is that he just had exercise tolerance test in early July which was unremarkable. He's had somewhat frequent similar episodes in the past of presumed tachycardia usually last about 30 minutes but yesterday's episode was unusual in that it lasted up to 2 hours. No significant caffeine use. No clear triggers. No alleviating factors. Patient does smoke. No recent thyroid functions.  Past Medical History  Diagnosis Date  . Tobacco abuse    Past Surgical History  Procedure Laterality Date  . Retinal detachment surgery    . Cervical spine surgery    . Leg surgery      Trauma    reports that he has been smoking Cigarettes.  He has a 50 pack-year smoking history. He does not have any smokeless tobacco history on file. He reports that he drinks alcohol. He reports that he does not use illicit drugs. family history includes Alcoholism in his father; Breast cancer in his sister; Ovarian cancer in his mother and sister. No Known Allergies    Review of Systems  Constitutional: Negative for fever, chills and fatigue.  Eyes: Negative for visual disturbance.  Respiratory: Negative for cough, chest tightness and shortness of breath.   Cardiovascular: Positive for palpitations. Negative for chest pain and leg swelling.  Gastrointestinal: Negative for abdominal pain.  Neurological: Positive for light-headedness. Negative for dizziness, syncope, weakness and headaches.  Psychiatric/Behavioral: Negative for confusion.       Objective:   Physical Exam  Constitutional: He is oriented to person, place, and time. He appears well-developed and well-nourished.  HENT:  Right Ear: External ear normal.  Left Ear: External ear normal.    Mouth/Throat: Oropharynx is clear and moist.  Eyes: Pupils are equal, round, and reactive to light.  Neck: Neck supple. No thyromegaly present.  Cardiovascular: Normal rate and regular rhythm.  Exam reveals no gallop and no friction rub.   Pulmonary/Chest: Effort normal and breath sounds normal. No respiratory distress. He has no wheezes. He has no rales.  Musculoskeletal: He exhibits no edema.  Neurological: He is alert and oriented to person, place, and time.          Assessment & Plan:  Palpitations/tachycardia based on history- roughly 2 hour episode yesterday. ? Transient atrial fibrillation versus PSVT versus other. Repeat EKG today. Check TSH.  Set up event monitor. Consider low-dose beta blocker when necessary for recurrent episodes until further evaluation.  EKG sinus rhythm with rate around 57 with no sniffed changes compared with prior tracing in July. Set up evaluation as above. Wrote prescription for Lopressor 25 mg 1 every 12 hours as needed for any recurrent tachycardia

## 2015-03-08 NOTE — Patient Instructions (Signed)
Nonspecific Tachycardia  Tachycardia is a faster than normal heartbeat (more than 100 beats per minute). In adults, the heart normally beats between 60 and 100 times a minute. A fast heartbeat may be a normal response to exercise or stress. It does not necessarily mean that something is wrong. However, sometimes when your heart beats too fast it may not be able to pump enough blood to the rest of your body. This can result in chest pain, shortness of breath, dizziness, and even fainting. Nonspecific tachycardia means that the specific cause or pattern of your tachycardia is unknown.  CAUSES   Tachycardia may be harmless or it may be due to a more serious underlying cause. Possible causes of tachycardia include:  · Exercise or exertion.  · Fever.  · Pain or injury.  · Infection.  · Loss of body fluids (dehydration).  · Overactive thyroid.  · Lack of red blood cells (anemia).  · Anxiety and stress.  · Alcohol.  · Caffeine.  · Tobacco products.  · Diet pills.  · Illegal drugs.  · Heart disease.  SYMPTOMS  · Rapid or irregular heartbeat (palpitations).  · Suddenly feeling your heart beating (cardiac awareness).  · Dizziness.  · Tiredness (fatigue).  · Shortness of breath.  · Chest pain.  · Nausea.  · Fainting.  DIAGNOSIS   Your caregiver will perform a physical exam and take your medical history. In some cases, a heart specialist (cardiologist) may be consulted. Your caregiver may also order:  · Blood tests.  · Electrocardiography. This test records the electrical activity of your heart.  · A heart monitoring test.  TREATMENT   Treatment will depend on the likely cause of your tachycardia. The goal is to treat the underlying cause of your tachycardia. Treatment methods may include:  · Replacement of fluids or blood through an intravenous (IV) tube for moderate to severe dehydration or anemia.  · New medicines or changes in your current medicines.  · Diet and lifestyle changes.  · Treatment for certain  infections.  · Stress relief or relaxation methods.  HOME CARE INSTRUCTIONS   · Rest.  · Drink enough fluids to keep your urine clear or pale yellow.  · Do not smoke.  · Avoid:  ¨ Caffeine.  ¨ Tobacco.  ¨ Alcohol.  ¨ Chocolate.  ¨ Stimulants such as over-the-counter diet pills or pills that help you stay awake.  ¨ Situations that cause anxiety or stress.  ¨ Illegal drugs such as marijuana, phencyclidine (PCP), and cocaine.  · Only take medicine as directed by your caregiver.  · Keep all follow-up appointments as directed by your caregiver.  SEEK IMMEDIATE MEDICAL CARE IF:   · You have pain in your chest, upper arms, jaw, or neck.  · You become weak, dizzy, or feel faint.  · You have palpitations that will not go away.  · You vomit, have diarrhea, or pass blood in your stool.  · Your skin is cool, pale, and wet.  · You have a fever that will not go away with rest, fluids, and medicine.  MAKE SURE YOU:   · Understand these instructions.  · Will watch your condition.  · Will get help right away if you are not doing well or get worse.  Document Released: 08/30/2004 Document Revised: 10/15/2011 Document Reviewed: 07/03/2011  [employer]® Patient Information ©2015 [employer]. This information is not intended to replace advice given to you by your health care provider. Make sure you discuss any questions   you have with your health care provider.

## 2015-03-21 ENCOUNTER — Ambulatory Visit (INDEPENDENT_AMBULATORY_CARE_PROVIDER_SITE_OTHER): Payer: Commercial Managed Care - HMO

## 2015-03-21 DIAGNOSIS — R000 Tachycardia, unspecified: Secondary | ICD-10-CM

## 2015-04-13 ENCOUNTER — Telehealth: Payer: Self-pay | Admitting: Internal Medicine

## 2015-04-13 NOTE — Telephone Encounter (Signed)
[employer] called by monitoring company that [name] had new onset atrial fibrillation. They called him and he noted abnormal irregular rhythm. I called in follow-up and he said the symptoms lasted 3 hours. He is  Now in normal regular rhythm on his exam. He feels ok. We discussed that he will likely need a medication to help with rate control and likely to start an anticoagulant. He will call Dr. Rosezella [place] office in the morning and I will relay this message as well. I also discussed warning signs to call 911 or come to the ER( persistent tachycardia with lightheadedness, low blood pressure or any other concerns).   Jules Husbands, MD

## 2015-04-18 ENCOUNTER — Ambulatory Visit: Payer: Commercial Managed Care - HMO | Admitting: Nurse Practitioner

## 2015-04-21 ENCOUNTER — Telehealth: Payer: Self-pay | Admitting: Cardiology

## 2015-04-21 ENCOUNTER — Ambulatory Visit (INDEPENDENT_AMBULATORY_CARE_PROVIDER_SITE_OTHER): Payer: Commercial Managed Care - HMO | Admitting: Physician Assistant

## 2015-04-21 ENCOUNTER — Encounter: Payer: Self-pay | Admitting: Physician Assistant

## 2015-04-21 VITALS — BP 148/96 | HR 66 | Ht 76.0 in | Wt 219.4 lb

## 2015-04-21 DIAGNOSIS — R9431 Abnormal electrocardiogram [ECG] [EKG]: Secondary | ICD-10-CM

## 2015-04-21 DIAGNOSIS — I4891 Unspecified atrial fibrillation: Secondary | ICD-10-CM

## 2015-04-21 MED ORDER — NEBIVOLOL HCL 5 MG PO TABS: 5.0000 mg | ORAL_TABLET | Freq: Every day | ORAL | Status: DC

## 2015-04-21 MED ORDER — NICOTINE 14 MG/24HR TD PT24: 14.0000 mg | MEDICATED_PATCH | Freq: Every day | TRANSDERMAL | Status: DC

## 2015-04-21 NOTE — Patient Instructions (Addendum)
Medication Instructions:  Your physician has recommended you make the following change in your medication:  1.  START Bystolic 5 mg take 1 tablet every day. 2.  Nicoderm Patch has been called in. Use it as directed.   Labwork: None ordered  Testing/Procedures: Your physician has requested that you have an echocardiogram. Echocardiography is a painless test that uses sound waves to create images of your heart. It provides your doctor with information about the size and shape of your heart and how well your heart's chambers and valves are working. This procedure takes approximately one hour. There are no restrictions for this procedure.    Follow-Up: Your physician recommends that you schedule a follow-up appointment in [place] DR. [place].   Any Other Special Instructions Will Be Listed Below (If Applicable).  Echocardiogram An echocardiogram, or echocardiography, uses sound waves (ultrasound) to produce an image of your heart. The echocardiogram is simple, painless, obtained within a short period of time, and offers valuable information to your health care provider. The images from an echocardiogram can provide information such as:  Evidence of coronary artery disease (CAD).  Heart size.  Heart muscle function.  Heart valve function.  Aneurysm detection.  Evidence of a past heart attack.  Fluid buildup around the heart.  Heart muscle thickening.  Assess heart valve function. LET [facility] CARE PROVIDER KNOW ABOUT:  Any allergies you have.  All medicines you are taking, including vitamins, herbs, eye drops, creams, and over-the-counter medicines.  Previous problems you or members of your family have had with the use of anesthetics.  Any blood disorders you have.  Previous surgeries you have had.  Medical conditions you have.  Possibility of pregnancy, if this applies. BEFORE THE PROCEDURE  No special preparation is needed. Eat  and drink normally.  PROCEDURE   In order to produce an image of your heart, gel will be applied to your chest and a wand-like tool (transducer) will be moved over your chest. The gel will help transmit the sound waves from the transducer. The sound waves will harmlessly bounce off your heart to allow the heart images to be captured in real-time motion. These images will then be recorded.  You may need an IV to receive a medicine that improves the quality of the pictures. AFTER THE PROCEDURE You may return to your normal schedule including diet, activities, and medicines, unless your health care provider tells you otherwise. Document Released: 07/20/2000 Document Revised: 12/07/2013 Document Reviewed: 03/30/2013 [facility] Patient Information 2015 [place], [place]. This information is not intended to replace advice given to you by your health care provider. Make sure you discuss any questions you have with your health care provider.

## 2015-04-21 NOTE — Progress Notes (Signed)
Cardiology Office Note    Date:  04/21/2015   ID:  TAARIQ LEITZ, DOB 02-10-1946, MRN [id number]  PCP:  [name], MD  Cardiologist:  Dr. Percival Spanish      History of Present Illness: RODNEY YERA is a 69 y.o. male with a history of tobacco abuse and palpaitions. He was seen by Dr. Percival Spanish on 01/2015 for evaluation of palpiations and get was set up with a 30 day cardiac monitor. He returns to clinic today for follow up.  Review of cardiac monitor revealed that Mr. Fishbaugh has new onset atrial fibrillation. Only two episodes were recorded. Patient was called during these episodes and he did report sx of tachypalpitations.His longest episode lasted ~3 hours. He was given a Rx for metroprolol to take as needed for papliations which he did take one time and his symptoms resolved.  No CP or SOB. He did not have any presyncope or syncope. He has not had shortness of breath, PND or orthopnea. He has not had any chest pressure, neck or arm discomfort. He's had no weight gain or edema. Patient drinks about 2 alcoholic beverages a night about 4-5 times per week. He is still smoking. Of note he recently underwent exercise stress testing in 02/2015 which was negative for ischemia.    Studies:   - GXT (02/2015):  Negative exercise stress test.     Recent Labs/Images:   Recent Labs  02/15/15 0927 03/08/15 1024  TSH  --  0.77  LDLCALC 104*  --   HDL 43  --      No results found.   Wt Readings from Last 3 Encounters:  04/21/15 219 lb 6.4 oz (99.519 kg)  03/08/15 217 lb (98.431 kg)  01/14/15 223 lb 14.4 oz (101.56 kg)     Past Medical History  Diagnosis Date  . Tobacco abuse     Current Outpatient Prescriptions  Medication Sig Dispense Refill  . loratadine (CLARITIN) 10 MG tablet Take 10 mg by mouth as needed for allergies.    . metoprolol tartrate (LOPRESSOR) 25 MG tablet Take 25 mg by mouth as needed (Take as 1 tablet as needed for tachycardia).    . nebivolol (BYSTOLIC) 5  MG tablet Take 1 tablet (5 mg total) by mouth daily. 30 tablet 5  . nicotine (NICODERM CQ) 14 mg/24hr patch Place 1 patch (14 mg total) onto the skin daily. 28 patch 3   No current facility-administered medications for this visit.     Allergies:   Review of patient's allergies indicates no known allergies.   Social History:  The patient  reports that he has been smoking Cigarettes.  He has a 50 pack-year smoking history. He does not have any smokeless tobacco history on file. He reports that he drinks alcohol. He reports that he does not use illicit drugs.   Family History:  The patient's family history includes Alcoholism in his father; Breast cancer in his sister; Ovarian cancer in his mother and sister.   ROS:  Please see the history of present illness.   All other systems reviewed and negative.    PHYSICAL EXAM: VS:  BP 148/96 mmHg  Pulse 66  Ht 6\' 4"  (1.93 m)  Wt 219 lb 6.4 oz (99.519 kg)  BMI 26.72 kg/m2 Well nourished, well developed, in no acute distress HEENT: normal Neck: no JVD Cardiac:  normal S1, S2; RRR; no murmur Lungs:  clear to auscultation bilaterally, no wheezing, rhonchi or rales Abd: soft, nontender, no hepatomegaly  Ext: no edema Skin: warm and dry Neuro:  CNs 2-12 intact, no focal abnormalities noted  EKG:  HR 66 NSR.    ASSESSMENT AND PLAN:  SALEEM COCCIA is a 69 y.o. male with a history of tobacco abuse and palpaitions. He was seen by Dr. Percival Spanish on 01/2015 for evaluation of palpiations and get was set up with a 30 day cardiac monitor. He returns to clinic today for follow up.  New onset PAF- today he is in NSR.  -- Recent cardiac event monitor reveals that he has had two episodes of atrial fib with RVR. Patient was called and was symptomatic with tachypalpiations. He took a metoprolol during his last episode which helped.  -- CHADSVASC score of 1 for age. No HTN, DM, CHF, Vasc dz or previous stroke. Will not start long term AC now  -- Will order 2D  ECHO. Recent normal GXT.  -- Will start Bystolic 5mg  qd -- We discussed that drinking can cause or worsen afib. He will try to cut back.   Tobacco abuse- He got chantix from Dr. Percival Spanish last visit but when he went to pick up the Rx it was over 300$. He called his insurance company who said he can get it cheaper at [facility]. He plans to go pick this up soon. Currently smoking ~ 1PPD -- He is willing to try a nicotine patch. Will have this called in   Disposition:   FU with Dr. Percival Spanish in 6 weeks or first available    Signed, Joen Laura, MHS 04/21/2015 7:20 PM    [address], [place], [place]  [address] Phone: [phone]; Fax: [phone]

## 2015-04-21 NOTE — Telephone Encounter (Signed)
PT WAS IN TODAY AND WAS TO HAVE NEBIVOLOL AND A NICATINE PATCH CALLED IN, CHANGED ON FORM TODAY TO [facility]-WE CALLED IT INTO [facility], HE NEEDS IT CALLED TO [place], PT NUMBER IF ANY PROBLEM [phone]

## 2015-04-22 MED ORDER — NEBIVOLOL HCL 5 MG PO TABS: 5.0000 mg | ORAL_TABLET | Freq: Every day | ORAL | Status: DC

## 2015-04-22 MED ORDER — NICOTINE 14 MG/24HR TD PT24: 14.0000 mg | MEDICATED_PATCH | Freq: Every day | TRANSDERMAL | Status: DC

## 2015-04-22 NOTE — Telephone Encounter (Signed)
Follow up      Pt was seen yesterday.  New presc was called in to old pharmacy.  Please call in presc for bystolic and nicotine patch to [place] at [facility].  Pt states he called yesterday, but presc were not called in to new pharmacy

## 2015-04-22 NOTE — Telephone Encounter (Signed)
Medications have been refilled.  Patient notified.

## 2015-04-28 ENCOUNTER — Ambulatory Visit (HOSPITAL_COMMUNITY): Payer: Commercial Managed Care - HMO | Attending: Cardiology

## 2015-04-28 ENCOUNTER — Other Ambulatory Visit: Payer: Self-pay

## 2015-04-28 DIAGNOSIS — I517 Cardiomegaly: Secondary | ICD-10-CM | POA: Diagnosis not present

## 2015-04-28 DIAGNOSIS — I350 Nonrheumatic aortic (valve) stenosis: Secondary | ICD-10-CM | POA: Diagnosis not present

## 2015-04-28 DIAGNOSIS — F17200 Nicotine dependence, unspecified, uncomplicated: Secondary | ICD-10-CM | POA: Insufficient documentation

## 2015-04-28 DIAGNOSIS — I77810 Thoracic aortic ectasia: Secondary | ICD-10-CM | POA: Diagnosis not present

## 2015-04-28 DIAGNOSIS — I4891 Unspecified atrial fibrillation: Secondary | ICD-10-CM | POA: Diagnosis not present

## 2015-04-28 DIAGNOSIS — I059 Rheumatic mitral valve disease, unspecified: Secondary | ICD-10-CM | POA: Insufficient documentation

## 2015-04-29 ENCOUNTER — Encounter: Payer: Self-pay | Admitting: Physician Assistant

## 2015-04-29 ENCOUNTER — Telehealth: Payer: Self-pay | Admitting: Cardiology

## 2015-04-29 DIAGNOSIS — I350 Nonrheumatic aortic (valve) stenosis: Secondary | ICD-10-CM | POA: Insufficient documentation

## 2015-04-29 NOTE — Telephone Encounter (Signed)
New Message  Pt returning Pam's phone call concerning echo. Please call back and discuss.

## 2015-04-29 NOTE — Telephone Encounter (Signed)
Called patient back about his echo results. Patient verbalized understanding.  Notes Recorded by Charlie Pitter, PA-C on 04/29/2015 at 11:51 AM I am covering Katie Thompson's box for now. Please let patient know echo overall looked good - normal EF, normal RV as well. He had mild diasotlic dysfunction and mild aortic stenosis and this will be followed periodically by his primary cardiologist. Melina Copa PA-C

## 2015-05-03 ENCOUNTER — Encounter: Payer: Self-pay | Admitting: Cardiology

## 2015-06-07 ENCOUNTER — Encounter: Payer: Self-pay | Admitting: Cardiology

## 2015-06-07 ENCOUNTER — Ambulatory Visit (INDEPENDENT_AMBULATORY_CARE_PROVIDER_SITE_OTHER): Payer: Commercial Managed Care - HMO | Admitting: Cardiology

## 2015-06-07 VITALS — BP 144/98 | HR 58 | Ht 76.0 in | Wt 222.0 lb

## 2015-06-07 DIAGNOSIS — I4891 Unspecified atrial fibrillation: Secondary | ICD-10-CM

## 2015-06-07 DIAGNOSIS — I480 Paroxysmal atrial fibrillation: Secondary | ICD-10-CM

## 2015-06-07 MED ORDER — METOPROLOL SUCCINATE ER 25 MG PO TB24: 25.0000 mg | ORAL_TABLET | Freq: Every day | ORAL | Status: DC

## 2015-06-07 NOTE — Progress Notes (Signed)
Cardiology Office Note   Date:  06/07/2015   ID:  Cory [name], DOB 1945/11/01, MRN [id number]  PCP:  [name], MD  Cardiologist:   [name], MD   Chief Complaint  Patient presents with  . Follow-up    no dizziness  . Chest Pain    no chest pain  . Edema    pt states he has no swelling in legs   . Shortness of Breath    no SOB      History of Present Illness: Cory [name] is a 69 y.o. male who presents for evaluation of  Palpitations.  Stress testing suggested no ischemia. However, a monitor demonstrated paroxysmal atrial fibrillation. This was during the summer. He was seen back and started on a beta blocker. He had a low thromboembolic risk and was not started on anticoagulation. Of note he's not had further palpitations since that time. He did have an echo which demonstrated some mild diastolic dysfunction and some mild aortic stenosis. He's otherwise done well. He denies any palpitations, presyncope or syncope. He's had no shortness of breath, PND or orthopnea. He's had no chest pressure, neck or arm discomfort. He's had no weight gain or edema.   Past Medical History  Diagnosis Date  . Tobacco abuse   . Aortic stenosis     a. Mild by echo 04/2015.    Past Surgical History  Procedure Laterality Date  . Retinal detachment surgery    . Cervical spine surgery    . Leg surgery      Trauma     Current Outpatient Prescriptions  Medication Sig Dispense Refill  . loratadine (CLARITIN) 10 MG tablet Take 10 mg by mouth as needed for allergies.    . metoprolol tartrate (LOPRESSOR) 25 MG tablet Take 25 mg by mouth as needed (Take as 1 tablet as needed for tachycardia).    . nebivolol (BYSTOLIC) 5 MG tablet Take 1 tablet (5 mg total) by mouth daily. 30 tablet 5  . nicotine (NICODERM CQ) 14 mg/24hr patch Place 1 patch (14 mg total) onto the skin daily. 28 patch 3   No current facility-administered medications for this visit.    Allergies:   Review of  patient's allergies indicates no known allergies.    ROS:  Please see the history of present illness.   Otherwise, review of systems are positive for none.   All other systems are reviewed and negative.    PHYSICAL EXAM: VS:  BP 144/98 mmHg  Pulse 58  Ht 6\' 4"  (1.93 m)  Wt 222 lb (100.699 kg)  BMI 27.03 kg/m2 , BMI Body mass index is 27.03 kg/(m^2). GENERAL:  Well appearing NECK:  No jugular venous distention, waveform within normal limits, carotid upstroke brisk and symmetric, no bruits, no thyromegaly LUNGS:  Clear to auscultation bilaterally CHEST:  Unremarkable HEART:  PMI not displaced or sustained,S1 and S2 within normal limits, no S3, no S4, no clicks, no rubs, no murmurs ABD:  Flat, positive bowel sounds normal in frequency in pitch, no bruits, no rebound, no guarding, no midline pulsatile mass, no hepatomegaly, no splenomegaly EXT:  2 plus pulses throughout, no edema, no cyanosis no clubbing    EKG:  EKG is not ordered today.    Recent Labs: 03/08/2015: TSH 0.77    Lipid Panel    Component Value Date/Time   CHOL 168 02/15/2015 0927   TRIG 104 02/15/2015 0927   HDL 43 02/15/2015 0927   CHOLHDL 3.9  02/15/2015 0927   VLDL 21 02/15/2015 0927   LDLCALC 104* 02/15/2015 0927      Wt Readings from Last 3 Encounters:  06/07/15 222 lb (100.699 kg)  04/21/15 219 lb 6.4 oz (99.519 kg)  03/08/15 217 lb (98.431 kg)      Other studies Reviewed: Additional studies/ records that were reviewed today include: Old EKG. Review of the above records demonstrates:  Please see elsewhere in the note.     ASSESSMENT AND PLAN:  ATRIAL FIB:  He's having very few paroxysms. Cory [name] has a CHA2DS2 - VASc score of 1.  He does not need anticoagulation.  I will switch him to Toprol-XL for cost. He's not having paroxysms any longer. 11 over these increase  TOBACCO:  He could not afford the Chantix.  He will continue to work to try to get this.  He also understands the  association between alcohol and A. fib.  HTN: The blood pressure is at target. No change in medications is indicated. We will continue with therapeutic lifestyle changes (TLC).  AS:  This is very mild. We'll follow this clinically.    Current medicines are reviewed at length with the patient today.  The patient does not have concerns regarding medicines.  The following changes have been made:  no change  Labs/ tests ordered today include:   No orders of the defined types were placed in this encounter.     Disposition:   FU with me in six months.     Signed, [name], MD  06/07/2015 11:09 AM    [place]

## 2015-06-07 NOTE — Patient Instructions (Signed)
Your physician wants you to follow-up in: 6 Months. You will receive a reminder letter in the mail two months in advance. If you don't receive a letter, please call our office to schedule the follow-up appointment.  Your physician has recommended you make the following change in your medication: STOP Bystolic and START Toprol XL 25 mg daily

## 2015-06-09 ENCOUNTER — Ambulatory Visit: Payer: Commercial Managed Care - HMO | Admitting: Cardiology

## 2016-01-20 ENCOUNTER — Other Ambulatory Visit: Payer: Self-pay | Admitting: *Deleted

## 2016-01-20 MED ORDER — METOPROLOL SUCCINATE ER 25 MG PO TB24: 25.0000 mg | ORAL_TABLET | Freq: Every day | ORAL | Status: DC

## 2016-01-21 ENCOUNTER — Ambulatory Visit (INDEPENDENT_AMBULATORY_CARE_PROVIDER_SITE_OTHER): Payer: Commercial Managed Care - HMO | Admitting: Physician Assistant

## 2016-01-21 VITALS — BP 124/74 | HR 51 | Temp 98.2°F | Resp 15 | Ht 76.0 in | Wt 231.0 lb

## 2016-01-21 DIAGNOSIS — Z720 Tobacco use: Secondary | ICD-10-CM

## 2016-01-21 DIAGNOSIS — F17200 Nicotine dependence, unspecified, uncomplicated: Secondary | ICD-10-CM

## 2016-01-21 DIAGNOSIS — J209 Acute bronchitis, unspecified: Secondary | ICD-10-CM | POA: Diagnosis not present

## 2016-01-21 MED ORDER — GUAIFENESIN ER 1200 MG PO TB12: 1.0000 | ORAL_TABLET | Freq: Two times a day (BID) | ORAL | Status: DC

## 2016-01-21 MED ORDER — AZITHROMYCIN 250 MG PO TABS: ORAL_TABLET | ORAL | Status: DC

## 2016-01-21 NOTE — Patient Instructions (Signed)
     IF you received an x-ray today, you will receive an invoice from [facility]. Please contact [facility] at [phone] with questions or concerns regarding your invoice.   IF you received labwork today, you will receive an invoice from [facility]. Please contact [facility] at [phone] with questions or concerns regarding your invoice.   Our billing staff will not be able to assist you with questions regarding bills from these companies.  You will be contacted with the lab results as soon as they are available. The fastest way to get your results is to activate your My Chart account. Instructions are located on the last page of this paperwork. If you have not heard from us regarding the results in 2 weeks, please contact this office.      

## 2016-01-21 NOTE — Progress Notes (Signed)
   Cory [name]  MRN: [id number] DOB: [birth year]  Subjective:  Pt presents to clinic with cold symptoms for about 10 days - started in the chest and now it is also affecting his sinuses.  He has h/o allergies but is unsure if that is what this is.  He smokes about a ppd but has no known lung diseases.  He has clear sputum and clear rhinorrhea - he is having no SOB or wheezing.  Home treatment - Cold preps, flonase Wife was sick with similar symptoms but she is better  Patient Active Problem List   Diagnosis Date Noted  . Atrial fibrillation, transient ([place]) 06/07/2015  . Aortic stenosis   . ABNORMAL ELECTROCARDIOGRAM 07/14/2009    Current Outpatient Prescriptions on File Prior to Visit  Medication Sig Dispense Refill  . loratadine (CLARITIN) 10 MG tablet Take 10 mg by mouth as needed for allergies.    . metoprolol succinate (TOPROL XL) 25 MG 24 hr tablet Take 1 tablet (25 mg total) by mouth daily. 30 tablet 4  . nicotine (NICODERM CQ) 14 mg/24hr patch Place 1 patch (14 mg total) onto the skin daily. 28 patch 3   No current facility-administered medications on file prior to visit.    No Known Allergies  Review of Systems  Constitutional: Negative for fever and chills.  HENT: Positive for congestion, postnasal drip and rhinorrhea (clear).   Respiratory: Positive for cough. Negative for shortness of breath and wheezing.        No h/o asthma, smoke <1ppd  Gastrointestinal: Negative.   Musculoskeletal: Negative for myalgias.  Allergic/Immunologic: Positive for environmental allergies.   Objective:  BP 124/74 mmHg  Pulse 51  Temp(Src) 98.2 F (36.8 C) (Oral)  Resp 15  Ht 6\' 4"  (1.93 m)  Wt 231 lb (104.781 kg)  BMI 28.13 kg/m2  SpO2 95%  Physical Exam  Constitutional: He is oriented to person, place, and time and well-developed, well-nourished, and in no distress.  HENT:  Head: Normocephalic and atraumatic.  Right Ear: Hearing, tympanic membrane, external ear and ear  canal normal.  Left Ear: Hearing, tympanic membrane, external ear and ear canal normal.  Nose: Nose normal.  Mouth/Throat: Uvula is midline, oropharynx is clear and moist and mucous membranes are normal.  Eyes: Conjunctivae are normal.  Neck: Normal range of motion.  Cardiovascular: Normal rate, regular rhythm and normal heart sounds.   Pulmonary/Chest: Effort normal and breath sounds normal. He has no wheezes.  Lymphadenopathy:       Head (right side): No tonsillar adenopathy present.       Head (left side): No tonsillar adenopathy present.    He has no cervical adenopathy.       Right: No supraclavicular adenopathy present.       Left: No supraclavicular adenopathy present.  Neurological: He is alert and oriented to person, place, and time. Gait normal.  Skin: Skin is warm and dry.  Psychiatric: Mood, memory, affect and judgment normal.    Assessment and Plan :  Smoker  Acute bronchitis, unspecified organism - Plan: Guaifenesin (MUCINEX MAXIMUM STRENGTH) 1200 MG TB12, azithromycin (ZITHROMAX Z-PAK) 250 MG tablet   Pt should decrease smoking and try to quit we will cover for atypical bronchitis due to his smoking - there is some component of allergies that he will continue to treat with his Flonase.  Windell Hummingbird PA-C  [facility] 01/21/2016 1:16 PM

## 2016-01-25 ENCOUNTER — Encounter: Payer: Self-pay | Admitting: Family Medicine

## 2016-01-25 ENCOUNTER — Ambulatory Visit (INDEPENDENT_AMBULATORY_CARE_PROVIDER_SITE_OTHER): Payer: Commercial Managed Care - HMO | Admitting: Family Medicine

## 2016-01-25 VITALS — BP 140/90 | HR 67 | Temp 98.2°F | Ht 76.0 in | Wt 228.0 lb

## 2016-01-25 DIAGNOSIS — R229 Localized swelling, mass and lump, unspecified: Secondary | ICD-10-CM

## 2016-01-25 DIAGNOSIS — J069 Acute upper respiratory infection, unspecified: Secondary | ICD-10-CM

## 2016-01-25 DIAGNOSIS — B9789 Other viral agents as the cause of diseases classified elsewhere: Principal | ICD-10-CM

## 2016-01-25 DIAGNOSIS — L98491 Non-pressure chronic ulcer of skin of other sites limited to breakdown of skin: Secondary | ICD-10-CM | POA: Diagnosis not present

## 2016-01-25 NOTE — Progress Notes (Signed)
   Subjective:    Patient ID: Cory [name], male    DOB: 07-05-1946, 70 y.o.   MRN: [id number]  HPI Patient seen for the following issues  URI symptoms for the past 2 weeks. Initially developed sore throat which improved after a few days. Subsequently, developed clear nasal discharge and cough. No fevers or chills. No hemoptysis. Appetite stable. Went to urgent care this past Saturday and prescribed Zithromax Took all but one pill but had abdominal cramps and nausea. Overall, feels better. No fever. No dyspnea. Does still smoke  Skin nodules present for the past year or so. Location is right side of nose and large ulcerative and nodular lesion right anterior chest wall. Slowly growing in size. Denies past history of skin cancer.  Past Medical History  Diagnosis Date  . Tobacco abuse   . Aortic stenosis     a. Mild by echo 04/2015.   Past Surgical History  Procedure Laterality Date  . Retinal detachment surgery    . Cervical spine surgery    . Leg surgery      Trauma    reports that he has been smoking Cigarettes.  He has a 50 pack-year smoking history. He does not have any smokeless tobacco history on file. He reports that he drinks alcohol. He reports that he does not use illicit drugs. family history includes Alcoholism in his father; Breast cancer in his sister; Ovarian cancer in his mother and sister. Allergies  Allergen Reactions  . Azithromycin Diarrhea  . Mucinex [Guaifenesin Er] Diarrhea      Review of Systems  Constitutional: Negative for fever, chills, appetite change and unexpected weight change.  HENT: Positive for congestion and rhinorrhea. Negative for sore throat.   Respiratory: Positive for cough. Negative for shortness of breath and wheezing.   Cardiovascular: Negative for chest pain.  Gastrointestinal: Negative for abdominal pain.  Hematological: Negative for adenopathy.       Objective:   Physical Exam  Constitutional: He appears  well-developed and well-nourished.  HENT:  Right Ear: External ear normal.  Left Ear: External ear normal.  Mouth/Throat: Oropharynx is clear and moist.  Neck: Neck supple.  Cardiovascular: Normal rate and regular rhythm.   Pulmonary/Chest: Effort normal. No respiratory distress. He has no wheezes. He has no rales.  Somewhat diminished breath sounds throughout but clear  Lymphadenopathy:    He has no cervical adenopathy.  Skin:  Patient has nodular skin lesion right side of nose. Approximately 6-7 mm diameter. Superficial telangiectasias. Umbilication of the center.  Much larger 1.5 x 2 cm ulcerative lesion right anterior chest wall. He has some nodular border          Assessment & Plan:  #1 probable viral URI with cough. Recent treatment with Zithromax. No clear indication for additional antibiotics at this time. Continue over-the-counter Mucinex. Follow-up promptly for any fever or increased shortness of breath  #2 skin nodule right side of nose. Suspect basal cell carcinoma. Set up referral to dermatology  #3 ulcerative and nodular lesion right anterior chest wall. Differential is basal cell carcinoma versus squamous cell carcinoma. Set up dermatology referral  [name] MD [facility]

## 2016-01-25 NOTE — Patient Instructions (Signed)

## 2016-01-25 NOTE — Progress Notes (Signed)
Pre visit review using our clinic review tool, if applicable. No additional management support is needed unless otherwise documented below in the visit note. 

## 2016-03-04 ENCOUNTER — Encounter: Payer: Self-pay | Admitting: Cardiology

## 2016-03-04 NOTE — Progress Notes (Signed)
Cardiology Office Note   Date:  03/05/2016   ID:  ELIAKIM PUZON, DOB [birth year], MRN [id number]  PCP:  [name], MD  Cardiologist:   [name], MD   Chief Complaint  Patient presents with  . Atrial Fibrillation      History of Present Illness: Cory [name] is a 70 y.o. male who presents for evaluation of palpitations.  Stress testing suggested no ischemia. However, a monitor demonstrated paroxysmal atrial fibrillation. This was during the summer of last year. He was seen back and started on a beta blocker. He had a low thromboembolic risk and was not started on anticoagulation.  At the last office appt in Nov I switched to Toprol.  He returns for follow up.  Since I saw him he rarely has palpitations. These might last for about 5 seconds at a time. He's had no sustained episodes. He denies any other cardiovascular symptoms. He has no chest pressure, neck or arm discomfort. He has no shortness of breath, PND or orthopnea.   Past Medical History:  Diagnosis Date  . Aortic stenosis    a. Mild by echo 04/2015.  Marland Kitchen PAF (paroxysmal atrial fibrillation) ([place])   . Tobacco abuse     Past Surgical History:  Procedure Laterality Date  . CERVICAL SPINE SURGERY    . LEG SURGERY     Trauma  . RETINAL DETACHMENT SURGERY       Current Outpatient Prescriptions  Medication Sig Dispense Refill  . loratadine (CLARITIN) 10 MG tablet Take 10 mg by mouth as needed for allergies.    . metoprolol succinate (TOPROL XL) 25 MG 24 hr tablet Take 1 tablet (25 mg total) by mouth daily. 30 tablet 4   No current facility-administered medications for this visit.     Allergies:   Azithromycin and Mucinex [guaifenesin er]    ROS:  Please see the history of present illness.   Otherwise, review of systems are positive for none.   All other systems are reviewed and negative.    PHYSICAL EXAM: VS:  BP (!) 152/90   Pulse (!) 56   Ht 6\' 4"  (1.93 m)   Wt 230 lb 6.4 oz (104.5 kg)   BMI  28.05 kg/m  , BMI Body mass index is 28.05 kg/m. GENERAL:  Well appearing NECK:  No jugular venous distention, waveform within normal limits, carotid upstroke brisk and symmetric, no bruits, no thyromegaly LUNGS:  Clear to auscultation bilaterally CHEST:  Unremarkable HEART:  PMI not displaced or sustained,S1 and S2 within normal limits, no S3, no S4, no clicks, no rubs, no murmurs ABD:  Flat, positive bowel sounds normal in frequency in pitch, no bruits, no rebound, no guarding, no midline pulsatile mass, no hepatomegaly, no splenomegaly EXT:  2 plus pulses throughout, no edema, no cyanosis no clubbing    EKG:  EKG is ordered today. Sinus bradycardia, rate 56, axis within normal limits, intervals within normal limits, no acute ST-T wave changes.  Recent Labs: 03/08/2015: TSH 0.77    Lipid Panel    Component Value Date/Time   CHOL 168 02/15/2015 0927   TRIG 104 02/15/2015 0927   HDL 43 02/15/2015 0927   CHOLHDL 3.9 02/15/2015 0927   VLDL 21 02/15/2015 0927   LDLCALC 104 (H) 02/15/2015 0927      Wt Readings from Last 3 Encounters:  03/05/16 230 lb 6.4 oz (104.5 kg)  01/25/16 228 lb (103.4 kg)  01/21/16 231 lb (104.8 kg)  Other studies Reviewed: Additional studies/ records that were reviewed today include: Old EKG. Review of the above records demonstrates:  Please see elsewhere in the note.     ASSESSMENT AND PLAN:  ATRIAL FIB:  He's having very few paroxysms. Mr. RANNY HERTENSTEIN has a CHA2DS2 - VASc score of 1.  He does not need anticoagulation.  He will continue on the meds as listed.  TOBACCO:  We again talked about this.  He was unable to afford the Chantix.  We talked about alternative strategies.   HTN: The blood pressure is mildly elevated.  However, he does not have a diagnosis of this.  He will keep a BP diary. No change in medications is indicated. We will continue with therapeutic lifestyle changes (TLC).  AS:  This is very mild. We'll follow this  clinically.    Current medicines are reviewed at length with the patient today.  The patient does not have concerns regarding medicines.  The following changes have been made:  no change  Labs/ tests ordered today include:   Orders Placed This Encounter  Procedures  . EKG 12-Lead     Disposition:   FU with me in 12 months.     Signed, [name], MD  03/05/2016 11:28 AM    [facility]

## 2016-03-05 ENCOUNTER — Ambulatory Visit (INDEPENDENT_AMBULATORY_CARE_PROVIDER_SITE_OTHER): Payer: Commercial Managed Care - HMO | Admitting: Cardiology

## 2016-03-05 ENCOUNTER — Encounter: Payer: Self-pay | Admitting: Cardiology

## 2016-03-05 VITALS — BP 152/90 | HR 56 | Ht 76.0 in | Wt 230.4 lb

## 2016-03-05 DIAGNOSIS — I480 Paroxysmal atrial fibrillation: Secondary | ICD-10-CM | POA: Diagnosis not present

## 2016-03-05 DIAGNOSIS — I4891 Unspecified atrial fibrillation: Secondary | ICD-10-CM

## 2016-03-05 NOTE — Patient Instructions (Signed)
Medication Instructions:  Continue current medications  Labwork: NONE  Testing/Procedures: NONE  Follow-Up: Your physician wants you to follow-up in: 1 Year. You will receive a reminder letter in the mail two months in advance. If you don't receive a letter, please call our office to schedule the follow-up appointment.   Any Other Special Instructions Will Be Listed Below (If Applicable).   If you need a refill on your cardiac medications before your next appointment, please call your pharmacy.   

## 2016-03-07 DIAGNOSIS — C44519 Basal cell carcinoma of skin of other part of trunk: Secondary | ICD-10-CM | POA: Diagnosis not present

## 2016-03-07 DIAGNOSIS — L821 Other seborrheic keratosis: Secondary | ICD-10-CM | POA: Diagnosis not present

## 2016-03-07 DIAGNOSIS — L570 Actinic keratosis: Secondary | ICD-10-CM | POA: Diagnosis not present

## 2016-03-07 DIAGNOSIS — L814 Other melanin hyperpigmentation: Secondary | ICD-10-CM | POA: Diagnosis not present

## 2016-03-07 DIAGNOSIS — C4441 Basal cell carcinoma of skin of scalp and neck: Secondary | ICD-10-CM | POA: Diagnosis not present

## 2016-03-07 DIAGNOSIS — D485 Neoplasm of uncertain behavior of skin: Secondary | ICD-10-CM | POA: Diagnosis not present

## 2016-03-07 DIAGNOSIS — C44319 Basal cell carcinoma of skin of other parts of face: Secondary | ICD-10-CM | POA: Diagnosis not present

## 2016-03-26 ENCOUNTER — Encounter: Payer: Self-pay | Admitting: Family Medicine

## 2016-04-12 ENCOUNTER — Encounter: Payer: Self-pay | Admitting: Family Medicine

## 2016-04-12 ENCOUNTER — Ambulatory Visit (INDEPENDENT_AMBULATORY_CARE_PROVIDER_SITE_OTHER): Payer: Commercial Managed Care - HMO | Admitting: Family Medicine

## 2016-04-12 VITALS — BP 136/84 | HR 68 | Temp 98.3°F | Ht 76.0 in | Wt 217.0 lb

## 2016-04-12 DIAGNOSIS — J020 Streptococcal pharyngitis: Secondary | ICD-10-CM

## 2016-04-12 LAB — POCT RAPID STREP A (OFFICE): RAPID STREP A SCREEN: NEGATIVE

## 2016-04-12 NOTE — Progress Notes (Signed)
HPI:  URI: -started: yesterday, has grandson bday party tomorrow -symptoms:nasal congestion, sore throat, cough -denies:fever, SOB, NVD, tooth pain -has tried: nothing -sick contacts/travel/risks: no reported flu, strep or tick exposure -Hx of: allergies  ROS: See pertinent positives and negatives per HPI.  Past Medical History:  Diagnosis Date  . Aortic stenosis    a. Mild by echo 04/2015.  Marland Kitchen PAF (paroxysmal atrial fibrillation) ([place])   . Tobacco abuse     Past Surgical History:  Procedure Laterality Date  . CERVICAL SPINE SURGERY    . LEG SURGERY     Trauma  . RETINAL DETACHMENT SURGERY      Family History  Problem Relation Age of Onset  . Ovarian cancer Mother   . Alcoholism Father   . Breast cancer Sister   . Ovarian cancer Sister     Social History   Social History  . Marital status: Married    Spouse name: N/A  . Number of children: 5  . Years of education: N/A   Social History Main Topics  . Smoking status: Current Every Day Smoker    Packs/day: 1.00    Years: 50.00    Types: Cigarettes  . Smokeless tobacco: None  . Alcohol use 0.0 oz/week  . Drug use: No  . Sexual activity: Not Asked   Other Topics Concern  . None   Social History Narrative   Scores reading tests.  Lives with wife.       Current Outpatient Prescriptions:  .  loratadine (CLARITIN) 10 MG tablet, Take 10 mg by mouth as needed for allergies., Disp: , Rfl:  .  metoprolol succinate (TOPROL XL) 25 MG 24 hr tablet, Take 1 tablet (25 mg total) by mouth daily., Disp: 30 tablet, Rfl: 4  EXAM:  Vitals:   04/12/16 0937  BP: 136/84  Pulse: 68  Temp: 98.3 F (36.8 C)    Body mass index is 26.41 kg/m.  GENERAL: vitals reviewed and listed above, alert, oriented, appears well hydrated and in no acute distress  HEENT: atraumatic, conjunttiva clear, no obvious abnormalities on inspection of external nose and ears, normal appearance of ear canals and TMs, clear nasal congestion,  mild post oropharyngeal erythema with PND, no tonsillar edema or exudate, no sinus TTP  NECK: no obvious masses on inspection  LUNGS: clear to auscultation bilaterally, no wheezes, rales or rhonchi, good air movement  CV: HRRR, SEM, no peripheral edema  MS: moves all extremities without noticeable abnormality  PSYCH: pleasant and cooperative, no obvious depression or anxiety  ASSESSMENT AND PLAN:  Discussed the following assessment and plan:  Streptococcal sore throat - Plan: POC Rapid Strep A  -given HPI and exam findings today, a serious infection or illness is unlikely. We discussed potential etiologies, with VURI being most likely, and advised supportive care and monitoring. Rapid strep per his request - though strep unlikely per hx and exam findings.We discussed treatment side effects, likely course, antibiotic misuse, transmission, and signs of developing a serious illness. -BP better on recheck, advised to quit smoking, advised healthy diet and regular exercise and PCP follow up -of course, we advised to return or notify a doctor immediately if symptoms worsen or persist or new concerns arise.    Patient Instructions  INSTRUCTIONS FOR UPPER RESPIRATORY INFECTION:  -plenty of rest and fluids  -nasal saline wash 2-3 times daily (use prepackaged nasal saline or bottled/distilled water if making your own)   -can use AFRIN nasal spray for drainage and nasal  congestion - but do NOT use longer then 3-4 days  -can use tylenol (in no history of liver disease) or ibuprofen (if no history of kidney disease, bowel bleeding or significant heart disease) as directed for aches and sorethroat  -if you are taking a cough medication - use only as directed, may also try a teaspoon of honey to coat the throat and throat lozenges.  -for sore throat, salt water gargles can help  -follow up if you have fevers, facial pain, tooth pain, difficulty breathing or are worsening or symptoms persist  longer then expected  Upper Respiratory Infection, Adult An upper respiratory infection (URI) is also known as the common cold. It is often caused by a type of germ (virus). Colds are easily spread (contagious). You can pass it to others by kissing, coughing, sneezing, or drinking out of the same glass. Usually, you get better in 1 to 3  weeks.  However, the cough can last for even longer. HOME CARE   Only take medicine as told by your doctor. Follow instructions provided above.  Drink enough water and fluids to keep your pee (urine) clear or pale yellow.  Get plenty of rest.  Return to work when your temperature is < 100 for 24 hours or as told by your doctor. You may use a face mask and wash your hands to stop your cold from spreading. GET HELP RIGHT AWAY IF:   After the first few days, you feel you are getting worse.  You have questions about your medicine.  You have chills, shortness of breath, or red spit (mucus).  You have pain in the face for more then 1-2 days, especially when you bend forward.  You have a fever, puffy (swollen) neck, pain when you swallow, or white spots in the back of your throat.  You have a bad headache, ear pain, sinus pain, or chest pain.  You have a high-pitched whistling sound when you breathe in and out (wheezing).  You cough up blood.  You have sore muscles or a stiff neck. MAKE SURE YOU:   Understand these instructions.  Will watch your condition.  Will get help right away if you are not doing well or get worse. Document Released: 01/09/2008 Document Revised: 10/15/2011 Document Reviewed: 10/28/2013 [facility] Patient Information 2015 [place], [place]. This information is not intended to replace advice given to you by your health care provider. Make sure you discuss any questions you have with your health care provider.    Colin Benton R., DO

## 2016-04-12 NOTE — Patient Instructions (Addendum)
INSTRUCTIONS FOR UPPER RESPIRATORY INFECTION:  -plenty of rest and fluids  -nasal saline wash 2-3 times daily (use prepackaged nasal saline or bottled/distilled water if making your own)   -can use AFRIN nasal spray for drainage and nasal congestion - but do NOT use longer then 3-4 days  -can use tylenol (in no history of liver disease) or ibuprofen (if no history of kidney disease, bowel bleeding or significant heart disease) as directed for aches and sorethroat  -if you are taking a cough medication - use only as directed, may also try a teaspoon of honey to coat the throat and throat lozenges.  -for sore throat, salt water gargles can help  -follow up if you have fevers, facial pain, tooth pain, difficulty breathing or are worsening or symptoms persist longer then expected  Upper Respiratory Infection, Adult An upper respiratory infection (URI) is also known as the common cold. It is often caused by a type of germ (virus). Colds are easily spread (contagious). You can pass it to others by kissing, coughing, sneezing, or drinking out of the same glass. Usually, you get better in 1 to 3  weeks.  However, the cough can last for even longer. HOME CARE   Only take medicine as told by your doctor. Follow instructions provided above.  Drink enough water and fluids to keep your pee (urine) clear or pale yellow.  Get plenty of rest.  Return to work when your temperature is < 100 for 24 hours or as told by your doctor. You may use a face mask and wash your hands to stop your cold from spreading. GET HELP RIGHT AWAY IF:   After the first few days, you feel you are getting worse.  You have questions about your medicine.  You have chills, shortness of breath, or red spit (mucus).  You have pain in the face for more then 1-2 days, especially when you bend forward.  You have a fever, puffy (swollen) neck, pain when you swallow, or white spots in the back of your throat.  You have a bad  headache, ear pain, sinus pain, or chest pain.  You have a high-pitched whistling sound when you breathe in and out (wheezing).  You cough up blood.  You have sore muscles or a stiff neck. MAKE SURE YOU:   Understand these instructions.  Will watch your condition.  Will get help right away if you are not doing well or get worse. Document Released: 01/09/2008 Document Revised: 10/15/2011 Document Reviewed: 10/28/2013 [facility] Patient Information 2015 [place], [place]. This information is not intended to replace advice given to you by your health care provider. Make sure you discuss any questions you have with your health care provider.

## 2016-05-16 DIAGNOSIS — C44311 Basal cell carcinoma of skin of nose: Secondary | ICD-10-CM | POA: Diagnosis not present

## 2016-05-16 DIAGNOSIS — C44519 Basal cell carcinoma of skin of other part of trunk: Secondary | ICD-10-CM | POA: Diagnosis not present

## 2016-06-22 ENCOUNTER — Other Ambulatory Visit: Payer: Self-pay | Admitting: Cardiology

## 2016-06-22 NOTE — Telephone Encounter (Signed)
Rx(s) sent to pharmacy electronically.  

## 2016-08-08 DIAGNOSIS — C44519 Basal cell carcinoma of skin of other part of trunk: Secondary | ICD-10-CM | POA: Diagnosis not present

## 2016-08-16 ENCOUNTER — Ambulatory Visit (INDEPENDENT_AMBULATORY_CARE_PROVIDER_SITE_OTHER): Payer: Commercial Managed Care - HMO

## 2016-08-16 DIAGNOSIS — Z23 Encounter for immunization: Secondary | ICD-10-CM | POA: Diagnosis not present

## 2016-08-16 NOTE — Progress Notes (Signed)
Patient had his flu injection 08/16/2016 at 1.20pm given by Rosealee Albee

## 2016-09-11 DIAGNOSIS — L814 Other melanin hyperpigmentation: Secondary | ICD-10-CM | POA: Diagnosis not present

## 2016-09-11 DIAGNOSIS — D1801 Hemangioma of skin and subcutaneous tissue: Secondary | ICD-10-CM | POA: Diagnosis not present

## 2016-09-11 DIAGNOSIS — Z85828 Personal history of other malignant neoplasm of skin: Secondary | ICD-10-CM | POA: Diagnosis not present

## 2016-09-11 DIAGNOSIS — C44509 Unspecified malignant neoplasm of skin of other part of trunk: Secondary | ICD-10-CM | POA: Diagnosis not present

## 2016-09-11 DIAGNOSIS — L821 Other seborrheic keratosis: Secondary | ICD-10-CM | POA: Diagnosis not present

## 2016-09-11 DIAGNOSIS — L570 Actinic keratosis: Secondary | ICD-10-CM | POA: Diagnosis not present

## 2016-09-11 DIAGNOSIS — D171 Benign lipomatous neoplasm of skin and subcutaneous tissue of trunk: Secondary | ICD-10-CM | POA: Diagnosis not present

## 2016-09-11 DIAGNOSIS — D485 Neoplasm of uncertain behavior of skin: Secondary | ICD-10-CM | POA: Diagnosis not present

## 2016-12-10 ENCOUNTER — Ambulatory Visit (INDEPENDENT_AMBULATORY_CARE_PROVIDER_SITE_OTHER): Payer: Medicare HMO | Admitting: Urgent Care

## 2016-12-10 ENCOUNTER — Encounter: Payer: Self-pay | Admitting: Urgent Care

## 2016-12-10 VITALS — BP 160/80 | HR 56 | Temp 98.3°F | Resp 16 | Ht 75.0 in | Wt 225.8 lb

## 2016-12-10 DIAGNOSIS — I10 Essential (primary) hypertension: Secondary | ICD-10-CM

## 2016-12-10 DIAGNOSIS — L239 Allergic contact dermatitis, unspecified cause: Secondary | ICD-10-CM

## 2016-12-10 DIAGNOSIS — W57XXXA Bitten or stung by nonvenomous insect and other nonvenomous arthropods, initial encounter: Secondary | ICD-10-CM | POA: Diagnosis not present

## 2016-12-10 DIAGNOSIS — I480 Paroxysmal atrial fibrillation: Secondary | ICD-10-CM | POA: Diagnosis not present

## 2016-12-10 DIAGNOSIS — R030 Elevated blood-pressure reading, without diagnosis of hypertension: Secondary | ICD-10-CM | POA: Diagnosis not present

## 2016-12-10 DIAGNOSIS — Z9109 Other allergy status, other than to drugs and biological substances: Secondary | ICD-10-CM

## 2016-12-10 MED ORDER — CETIRIZINE HCL 10 MG PO TABS: 10.0000 mg | ORAL_TABLET | Freq: Every day | ORAL | 11 refills | Status: AC

## 2016-12-10 MED ORDER — RANITIDINE HCL 150 MG PO TABS: 150.0000 mg | ORAL_TABLET | Freq: Two times a day (BID) | ORAL | 0 refills | Status: DC

## 2016-12-10 MED ORDER — TRIAMCINOLONE ACETONIDE 0.1 % EX CREA: 1.0000 "application " | TOPICAL_CREAM | Freq: Two times a day (BID) | CUTANEOUS | 0 refills | Status: DC

## 2016-12-10 NOTE — Progress Notes (Signed)
  MRN: [id number] DOB: 12-31-1945  Subjective:   Cory [name] is a 71 y.o. male presenting for chief complaint of Tick bites (on back and thighs)  Reports 4 day history of suffering multiple tick bites while doing yardwork. Patient reports that he has removed them all, had several on his leg and back. All bites have been very itchy and red. Denies fever, headache, sore throat, cough, chest pain, heart racing, palpitations, n/v, abdominal pain, confusion. Regarding his BP, patient reports that he takes metoprolol but has not had consistent follow up for his BP. He also reports having had congestion, scratchy throat. He is not currently taking anything for allergies. Has longstanding history of smoking, 1ppd for ~50 years.  Cory [name] has a current medication list which includes the following prescription(s): loratadine and metoprolol succinate. Also is allergic to azithromycin and mucinex [guaifenesin er].  Cory [name]  has a past medical history of Aortic stenosis; PAF (paroxysmal atrial fibrillation) ([place]); and Tobacco abuse. Also  has a past surgical history that includes Retinal detachment surgery; Cervical spine surgery; and Leg Surgery.  Objective:   Vitals: BP (!) 160/80   Pulse (!) 56   Temp 98.3 F (36.8 C) (Oral)   Resp 16   Ht 6\' 3"  (1.905 m)   Wt 225 lb 12.8 oz (102.4 kg)   SpO2 95%   BMI 28.22 kg/m   BP Readings from Last 3 Encounters:  12/10/16 (!) 160/80  04/12/16 136/84  03/05/16 (!) 152/90   Physical Exam  Constitutional: He is oriented to person, place, and time. He appears well-developed and well-nourished.  HENT:  TM's intact bilaterally, no effusions or erythema. Nasal turbinates boggy and violaceous, nasal passages patent. No sinus tenderness. Moderate post-nasal drainage, mucous membranes moist.  Eyes: EOM are normal. Pupils are equal, round, and reactive to light. Right eye exhibits no discharge. Left eye exhibits no discharge. No scleral icterus.  Neck: Normal range  of motion. Neck supple.  Cardiovascular: Normal rate, regular rhythm and intact distal pulses.  Exam reveals no gallop and no friction rub.   No murmur heard. Pulmonary/Chest: No respiratory distress. He has no wheezes. He has no rales.  Musculoskeletal: He exhibits no edema.  Lymphadenopathy:    He has no cervical adenopathy.  Neurological: He is alert and oriented to person, place, and time.  Skin: Skin is warm and dry. Rash noted.     Psychiatric: He has a normal mood and affect.    Assessment and Plan :   1. Allergic dermatitis 2. Tick bite, initial encounter - Start Zyrtec with Zantac, use topical Kenalog as well. Return-to-clinic precautions discussed, patient verbalized understanding.   3. Environmental allergies - Start Zyrtec as above.   4. Elevated blood pressure reading 5. Essential hypertension - Recheck at home once itching from dermatitis is improved. RTC if BP remains >785'Y systolic.  6. PAF (paroxysmal atrial fibrillation) ([facility]) - Stable, monitor.  Cory Eagles, PA-C [facility] [phone] 12/10/2016  4:30 PM

## 2016-12-10 NOTE — Patient Instructions (Addendum)
Please make sure you check your blood pressure at home over the next week. I expect that once your itching and discomfort settles down, your blood pressure may improve. However, if the top number is consistently greater than 150, you need to come back for a recheck on your blood pressure.   Tick Bite Information, Adult Ticks are insects that draw blood for food. Most ticks live in shrubs and grassy areas. They climb onto people and animals that brush against the leaves and grasses that they rest on. Then they bite, attaching themselves to the skin. Most ticks are harmless, but some ticks carry germs that can spread to a person through a bite and cause a disease. To reduce your risk of getting a disease from a tick bite, it is important to take steps to prevent tick bites. It is also important to check for ticks after being outdoors. If you find that a tick has attached to you, watch for symptoms of disease. How can I prevent tick bites? Take these steps to help prevent tick bites when you are outdoors in an area where ticks are found:  Use insect repellent that has DEET (20% or higher), picaridin, or IR3535 in it. Use it on:  Skin that is showing.  The top of your boots.  Your pant legs.  Your sleeve cuffs.  For repellent products that contain permethrin, follow product instructions. Use these products on:  Clothing.  Gear.  Boots.  Tents.  Wear protective clothing. Long sleeves and long pants offer the best protection from ticks.  Wear light-colored clothing so you can see ticks more easily.  Tuck your pant legs into your socks.  If you go walking on a trail, stay in the middle of the trail so your skin, hair, and clothing do not touch the bushes.  Avoid walking through areas with long grass.  Check for ticks on your clothing, hair, and skin often while you are outside, and check again before you go inside. Make sure to check the places that ticks attach themselves most often.  These places include the scalp, neck, armpits, waist, groin, and joint areas. Ticks that carry a disease called Lyme disease have to be attached to the skin for 24-48 hours. Checking for ticks every day will lessen your risk of this and other diseases.  When you come indoors, wash your clothes and take a shower or a bath right away. Dry your clothes in a dryer on high heat for at least 60 minutes. This will kill any ticks in your clothes. What is the proper way to remove a tick? If you find a tick on your body, remove it as soon as possible. Removing a tick sooner rather than later can prevent germs from passing from the tick to your body. To remove a tick that is crawling on your skin but has not bitten:  Go outdoors and brush the tick off.  Remove the tick with tape or a lint roller. To remove a tick that is attached to your skin:  Wash your hands.  If you have latex gloves, put them on.  Use tweezers, curved forceps, or a tick-removal tool to gently grasp the tick as close to your skin and the tick's head as possible.  Gently pull with steady, upward pressure until the tick lets go. When removing the tick:  Take care to keep the tick's head attached to its body.  Do not twist or jerk the tick. This can make the tick's  head or mouth break off.  Do not squeeze or crush the tick's body. This could force disease-carrying fluids from the tick into your body. Do not try to remove a tick with heat, alcohol, petroleum jelly, or fingernail polish. Using these methods can cause the tick to salivate and regurgitate into your bloodstream, increasing your risk of getting a disease. What should I do after removing a tick?  Clean the bite area with soap and water, rubbing alcohol, or an iodine scrub.  If an antiseptic cream or ointment is available, apply a small amount to the bite site.  Wash and disinfect any instruments that you used to remove the tick. How should I dispose of a tick? To  dispose of a live tick, use one of these methods:  Place it in rubbing alcohol.  Place it in a sealed bag or container.  Wrap it tightly in tape.  Flush it down the toilet. Contact a health care provider if:  You have symptoms of a disease after a tick bite. Symptoms of a tick-borne disease can occur from moments after the tick bites to up to 30 days after a tick is removed. Symptoms include:  Muscle, joint, or bone pain.  Difficulty walking or moving your legs.  Numbness in the legs.  Paralysis.  Red rash around the tick bite area that is shaped like a target or a "bull's-eye."  Redness and swelling in the area of the tick bite.  Fever.  Repeated vomiting.  Diarrhea.  Weight loss.  Tender, swollen lymph glands.  Shortness of breath.  Cough.  Pain in the abdomen.  Headache.  Abnormal tiredness.  A change in your level of consciousness.  Confusion. Get help right away if:  You are not able to remove a tick.  A part of a tick breaks off and gets stuck in your skin.  Your symptoms get worse. Summary  Ticks may carry germs that can spread to a person through a bite and cause disease.  Wear protective clothing and use insect repellent to prevent tick bites. Follow product instructions.  If you find a tick on your body, remove it as soon as possible. If the tick is attached, do not try to remove with heat, alcohol, petroleum jelly, or fingernail polish.  Remove the attached tick using tweezers, curved forceps, or a tick-removal tool. Gently pull with steady, upward pressure until the tick lets go. Do not twist or jerk the tick. Do not squeeze or crush the tick's body.  If you have symptoms after being bitten by a tick, contact a health care provider. This information is not intended to replace advice given to you by your health care provider. Make sure you discuss any questions you have with your health care provider. Document Released: 07/20/2000 Document  Revised: 05/04/2016 Document Reviewed: 05/04/2016 Elsevier Interactive Patient Education  2017 [place] Dermatitis Dermatitis is redness, soreness, and swelling (inflammation) of the skin. Contact dermatitis is a reaction to certain substances that touch the skin. There are two types of contact dermatitis:  Irritant contact dermatitis. This type is caused by something that irritates your skin, such as dry hands from washing them too much. This type does not require previous exposure to the substance for a reaction to occur. This type is more common.  Allergic contact dermatitis. This type is caused by a substance that you are allergic to, such as a nickel allergy or poison ivy. This type only occurs if you  have been exposed to the substance (allergen) before. Upon a repeat exposure, your body reacts to the substance. This type is less common. What are the causes? Many different substances can cause contact dermatitis. Irritant contact dermatitis is most commonly caused by exposure to:  Makeup.  Soaps.  Detergents.  Bleaches.  Acids.  Metal salts, such as nickel. Allergic contact dermatitis is most commonly caused by exposure to:  Poisonous plants.  Chemicals.  Jewelry.  Latex.  Medicines.  Preservatives in products, such as clothing. What increases the risk? This condition is more likely to develop in:  People who have jobs that expose them to irritants or allergens.  People who have certain medical conditions, such as asthma or eczema. What are the signs or symptoms? Symptoms of this condition may occur anywhere on your body where the irritant has touched you or is touched by you. Symptoms include:  Dryness or flaking.  Redness.  Cracks.  Itching.  Pain or a burning feeling.  Blisters.  Drainage of small amounts of blood or clear fluid from skin cracks. With allergic contact dermatitis, there may also be swelling in areas such as the  eyelids, mouth, or genitals. How is this diagnosed? This condition is diagnosed with a medical history and physical exam. A patch skin test may be performed to help determine the cause. If the condition is related to your job, you may need to see an occupational medicine specialist. How is this treated? Treatment for this condition includes figuring out what caused the reaction and protecting your skin from further contact. Treatment may also include:  Steroid creams or ointments. Oral steroid medicines may be needed in more severe cases.  Antibiotics or antibacterial ointments, if a skin infection is present.  Antihistamine lotion or an antihistamine taken by mouth to ease itching.  A bandage (dressing). Follow these instructions at home: [place] your skin as needed.  Apply cool compresses to the affected areas.  Try taking a bath with:  Epsom salts. Follow the instructions on the packaging. You can get these at your local pharmacy or grocery store.  Baking soda. Pour a small amount into the bath as directed by your health care provider.  Colloidal oatmeal. Follow the instructions on the packaging. You can get this at your local pharmacy or grocery store.  Try applying baking soda paste to your skin. Stir water into baking soda until it reaches a paste-like consistency.  Do not scratch your skin.  Bathe less frequently, such as every other day.  Bathe in lukewarm water. Avoid using hot water. Medicines   Take or apply over-the-counter and prescription medicines only as told by your health care provider.  If you were prescribed an antibiotic medicine, take or apply your antibiotic as told by your health care provider. Do not stop using the antibiotic even if your condition starts to improve. General instructions   Keep all follow-up visits as told by your health care provider. This is important.  Avoid the substance that caused your reaction. If you do not  know what caused it, keep a journal to try to track what caused it. Write down:  What you eat.  What cosmetic products you use.  What you drink.  What you wear in the affected area. This includes jewelry.  If you were given a dressing, take care of it as told by your health care provider. This includes when to change and remove it. Contact a health care provider if:  Your condition does not improve with treatment.  Your condition gets worse.  You have signs of infection such as swelling, tenderness, redness, soreness, or warmth in the affected area.  You have a fever.  You have new symptoms. Get help right away if:  You have a severe headache, neck pain, or neck stiffness.  You vomit.  You feel very sleepy.  You notice red streaks coming from the affected area.  Your bone or joint underneath the affected area becomes painful after the skin has healed.  The affected area turns darker.  You have difficulty breathing. This information is not intended to replace advice given to you by your health care provider. Make sure you discuss any questions you have with your health care provider. Document Released: 07/20/2000 Document Revised: 12/29/2015 Document Reviewed: 12/08/2014 Elsevier Interactive Patient Education  2017 [employer].    IF you received an x-ray today, you will receive an invoice from [facility]. Please contact [facility] at [phone] with questions or concerns regarding your invoice.   IF you received labwork today, you will receive an invoice from [facility]. Please contact [facility] at [phone] with questions or concerns regarding your invoice.   Our billing staff will not be able to assist you with questions regarding bills from these companies.  You will be contacted with the lab results as soon as they are available. The fastest way to get your results is to activate your My Chart account. Instructions are located on the last  page of this paperwork. If you have not heard from [place] regarding the results in 2 weeks, please contact this office.

## 2016-12-21 DIAGNOSIS — R062 Wheezing: Secondary | ICD-10-CM | POA: Diagnosis not present

## 2016-12-21 DIAGNOSIS — J069 Acute upper respiratory infection, unspecified: Secondary | ICD-10-CM | POA: Diagnosis not present

## 2016-12-21 DIAGNOSIS — J208 Acute bronchitis due to other specified organisms: Secondary | ICD-10-CM | POA: Diagnosis not present

## 2016-12-21 DIAGNOSIS — R05 Cough: Secondary | ICD-10-CM | POA: Diagnosis not present

## 2017-02-01 DIAGNOSIS — C44529 Squamous cell carcinoma of skin of other part of trunk: Secondary | ICD-10-CM | POA: Diagnosis not present

## 2017-03-04 DIAGNOSIS — H2512 Age-related nuclear cataract, left eye: Secondary | ICD-10-CM | POA: Diagnosis not present

## 2017-03-04 DIAGNOSIS — H33001 Unspecified retinal detachment with retinal break, right eye: Secondary | ICD-10-CM | POA: Diagnosis not present

## 2017-03-04 DIAGNOSIS — H524 Presbyopia: Secondary | ICD-10-CM | POA: Diagnosis not present

## 2017-04-01 DIAGNOSIS — D485 Neoplasm of uncertain behavior of skin: Secondary | ICD-10-CM | POA: Diagnosis not present

## 2017-04-01 DIAGNOSIS — Z85828 Personal history of other malignant neoplasm of skin: Secondary | ICD-10-CM | POA: Diagnosis not present

## 2017-04-01 DIAGNOSIS — C44712 Basal cell carcinoma of skin of right lower limb, including hip: Secondary | ICD-10-CM | POA: Diagnosis not present

## 2017-04-01 DIAGNOSIS — D1801 Hemangioma of skin and subcutaneous tissue: Secondary | ICD-10-CM | POA: Diagnosis not present

## 2017-04-01 DIAGNOSIS — L814 Other melanin hyperpigmentation: Secondary | ICD-10-CM | POA: Diagnosis not present

## 2017-04-01 DIAGNOSIS — L821 Other seborrheic keratosis: Secondary | ICD-10-CM | POA: Diagnosis not present

## 2017-06-04 NOTE — Progress Notes (Signed)
Subjective:   Cory [name] is a 71 y.o. male who presents for Medicare Annual/Subsequent preventive examination.  The Patient was informed that the wellness visit is to identify future health risk and educate and initiate measures that can reduce risk for increased disease through the lifespan.    Annual Wellness Assessment  Reports health as  More grandchildren this year No 6 From over 1 year to 20   Has apt with Cardiology 1st of dec   Preventive Screening -Counseling & Management  Medicare Annual Preventive Care Visit - Subsequent Last OV / Needs to make an apt and schedule   Current every day smoker; w 50 pack years AAA completed 04/2013  Educated Regarding LDCT   ETOH; drinks more at the beach More in the summer'; Beer,wine and liquor For men ; recommendation for 2 per day Goes with ETOH   Tobacco Still smoke; .5 pack per day Trying to cut back;   PSA 07/2009 Health Maintenance Due  Topic Date Due  . Hepatitis C Screening  May 18, 1946  . COLONOSCOPY  02/12/1994  . TETANUS/TDAP  08/06/2013  . PNA vac Low Risk Adult (2 of 2 - PCV13) 04/01/2014  . INFLUENZA VACCINE  03/06/2017   Will consider cologuard  Will take the prevnar today  Will take flu vaccine today  Needs Tdap  And educated; will postpone    VS reviewed;   Diet  Generally eats 3 meals Breakfast usually has egg on a muffin with avocado  Lunch; usually eat light lunch/  Supper - had  Meat and couple of vegetables  Fish; tuna; grouper  BMI 28   Exercise Has a huge yard and gardens Counselling psychologist - no   Stressors: none Sleep patterns: sleeps so so due to joint pains  Functional changes from this year to last year, just can't do as much  Pain generalize OA Generally knees, shoulders and hips  Takes ibuprofen and tylenol    Cardiac Risk Factors Addressed Hyperlipidemia - chol/hdl 3.9, chol 160, hdl 43 ; trig 104 Diabetes- neg   Advanced Directives no and declines today    Patient Care Team: [name], MD as PCP - General   Cardiac Risk Factors include: advanced age (>10men, >41 women);male gender;smoking/ tobacco exposure     Objective:    Vitals: BP 134/84   Pulse 64   Ht 6\' 3"  (1.905 m)   Wt 226 lb (102.5 kg)   SpO2 98%   BMI 28.25 kg/m   Body mass index is 28.25 kg/m.  Tobacco History  Smoking Status  . Current Every Day Smoker  . Packs/day: 1.00  . Years: 50.00  . Types: Cigarettes  Smokeless Tobacco  . Never Used     Ready to quit: Not Answered Counseling given: Yes   Past Medical History:  Diagnosis Date  . Aortic stenosis    a. Mild by echo 04/2015.  Marland Kitchen PAF (paroxysmal atrial fibrillation) ([place])   . Tobacco abuse    Past Surgical History:  Procedure Laterality Date  . CERVICAL SPINE SURGERY    . LEG SURGERY     Trauma  . RETINAL DETACHMENT SURGERY     Family History  Problem Relation Age of Onset  . Ovarian cancer Mother   . Alcoholism Father   . Breast cancer Sister   . Ovarian cancer Sister    History  Sexual Activity  . Sexual activity: Not on file    Outpatient Encounter Prescriptions as of  06/05/2017  Medication Sig  . cetirizine (ZYRTEC) 10 MG tablet Take 1 tablet (10 mg total) by mouth daily.  Marland Kitchen loratadine (CLARITIN) 10 MG tablet Take 10 mg by mouth as needed for allergies.  . metoprolol succinate (TOPROL-XL) 25 MG 24 hr tablet Take 1 tablet (25 mg total) by mouth daily.  Marland Kitchen triamcinolone cream (KENALOG) 0.1 % Apply 1 application topically 2 (two) times daily.  . ranitidine (ZANTAC) 150 MG tablet Take 1 tablet (150 mg total) by mouth 2 (two) times daily. (Patient not taking: Reported on 06/05/2017)   No facility-administered encounter medications on file as of 06/05/2017.     Activities of Daily Living In your present state of health, do you have any difficulty performing the following activities: 06/05/2017  Hearing? N  Vision? N  Difficulty concentrating or making decisions? N  Walking  or climbing stairs? N  Dressing or bathing? N  Doing errands, shopping? N  Preparing Food and eating ? N  Using the Toilet? N  In the past six months, have you accidently leaked urine? N  Do you have problems with loss of bowel control? N  Managing your Medications? N  Managing your Finances? N  Housekeeping or managing your Housekeeping? N  Some recent data might be hidden    Patient Care Team: [name], MD as PCP - General   Assessment:     Exercise Activities and Dietary recommendations Current Exercise Habits: Home exercise routine, Time (Minutes): > 60, Frequency (Times/Week): 6, Weekly Exercise (Minutes/Week): 0, Intensity: Mild  Goals    . Quit smoking / using tobacco          Smoking;  Educated to avoid secondary smoke Smoking cessation in [place]: [place] at [phone] -day Smoking cessation in [place]: Evening; [phone] Smoking cessation at [facility]: [phone] Smoking cessation at [facility]: [phone] Classes offered a couple of times a month;  Will work with the patient as far as registration and location  Meds may help; chatix (Varenicline); Zyban (Bupropion SR); Nicotine Replacement (gum; lozenges; patches; etc.)   30 pack yr smoking hx: Educated regarding LDCT; To discuss with MD at next fup.  [place] helpline to help you quit  1-800-QUIT-NOW 619-592-3998).        Fall Risk Fall Risk  06/05/2017 12/10/2016 01/21/2016 06/11/2014  Falls in the past year? No No No No   Depression Screen PHQ 2/9 Scores 06/05/2017 12/10/2016 01/21/2016 06/11/2014  PHQ - 2 Score 0 0 0 0    Cognitive Function     Ad8 score reviewed for issues:  Issues making decisions:  Less interest in hobbies / activities:  Repeats questions, stories (family complaining):  Trouble using ordinary gadgets (microwave, computer, phone):  Forgets the month or year:   Mismanaging finances:   Remembering appts:  Daily problems with thinking and/or  memory: Ad8 score is=0       Immunization History  Administered Date(s) Administered  . Influenza Whole 07/14/2009  . Influenza, High Dose Seasonal PF 08/16/2016, 06/05/2017  . Influenza,inj,Quad PF,6+ Mos 08/09/2014  . Influenza-Unspecified 09/23/2015  . Pneumococcal Conjugate-13 06/05/2017  . Pneumococcal Polysaccharide-23 07/14/2009, 04/01/2013  . Td 08/07/2003   Screening Tests Health Maintenance  Topic Date Due  . Hepatitis C Screening  January 05, 1946  . COLONOSCOPY  02/12/1994  . TETANUS/TDAP  08/06/2013  . PNA vac Low Risk Adult (2 of 2 - PCV13) 04/01/2014  . INFLUENZA VACCINE  03/06/2017      Plan:     PCP Notes  Health Maintenance Flu vaccine given today Prevnar 13 given today  Will consider cologuard Will consider LDCT    Abnormal Screens  None   Referrals  none  Patient concerns; Still trying to quit smoking   Nurse Concerns; Limit ETOH to 2 drinks per day    Next PCP apt To fup with dr. Elease Hashimoto for annual PE and labs      I have personally reviewed and noted the following in the patient's chart:   . Medical and social history . Use of alcohol, tobacco or illicit drugs  . Current medications and supplements . Functional ability and status . Nutritional status . Physical activity . Advanced directives . List of other physicians . Hospitalizations, surgeries, and ER visits in previous 12 months . Vitals . Screenings to include cognitive, depression, and falls . Referrals and appointments  In addition, I have reviewed and discussed with patient certain preventive protocols, quality metrics, and best practice recommendations. A written personalized care plan for preventive services as well as general preventive health recommendations were provided to patient.     Wynetta Fines, RN  06/05/2017

## 2017-06-05 ENCOUNTER — Ambulatory Visit (INDEPENDENT_AMBULATORY_CARE_PROVIDER_SITE_OTHER): Payer: Medicare HMO

## 2017-06-05 VITALS — BP 134/84 | HR 64 | Ht 75.0 in | Wt 226.0 lb

## 2017-06-05 DIAGNOSIS — Z0000 Encounter for general adult medical examination without abnormal findings: Secondary | ICD-10-CM | POA: Diagnosis not present

## 2017-06-05 DIAGNOSIS — Z23 Encounter for immunization: Secondary | ICD-10-CM | POA: Diagnosis not present

## 2017-06-05 DIAGNOSIS — Z1159 Encounter for screening for other viral diseases: Secondary | ICD-10-CM

## 2017-06-05 NOTE — Patient Instructions (Addendum)
Mr. Beckmann , Thank you for taking time to come for your Medicare Wellness Visit. I appreciate your ongoing commitment to your health goals. Please review the following plan we discussed and let me know if I can assist you in the future.   To see Dr. Elease Hashimoto soon for annual labs and physical Can discuss the LDCT and the cologuard with him  Discussed LDCT and he will consider   MOHl procedure to be done Nov 7th; Good luck   Seeing cardiology Dec   Medicare now request all "baby boomers" test for possible exposure to Hepatitis C. Many may have been exposed due to dental work, tatoo's, vaccinations when young. The Hepatitis C virus is dormant for many years and then sometimes will cause liver cancer. If you gave blood in the past 15 years, you were most likely checked for Hep C. If you rec'd blood; you may want to consider testing or if you are high risk for any other reason.    Colonoscopy DUE  Medicare Part B will cover the CologuardTM test once every three years for beneficiaries who meet all of the following criteria:  Age 73 to 73 years,  Asymptomatic (no signs or symptoms of colorectal disease including but not limited to lower gastrointestinal pain, blood in stool, positive guaiac fecal occult blood test or fecal immunochemical test), and  At average risk of developing colorectal cancer (no personal history of adenomatous polyps, colorectal cancer, or inflammatory bowel disease, including Crohn's Disease and ulcerative colitis; no family history of colorectal cancers or adenomatous polyps, familial adenomatous polyposis, or hereditary nonpolyposis colorectal cancer).  A Tetanus is recommended every 10 years. Medicare covers a tetanus if you have a cut or wound; otherwise, there may be a charge. If you had not had a tetanus with pertusses, known as the Tdap, you can take this anytime.   Shingrix is a vaccine for the prevention of Shingles in Adults 50 and older.  If you are on  Medicare, you can request a prescription from your doctor to be filled at a pharmacy.  Please check with your benefits regarding applicable copays or out of pocket expenses.  The Shingrix is given in 2 vaccines approx 8 weeks apart. You must receive the 2nd dose prior to 6 months from receipt of the first.    These are the goals we discussed: Goals    . Quit smoking / using tobacco          Smoking;  Educated to avoid secondary smoke Smoking cessation in [place]: [place] at [phone] -[phone] Smoking cessation in [place]: Evening; [phone] Smoking cessation at [facility]: [phone] Smoking cessation at [facility]: [phone] Classes offered a couple of times a month;  Will work with the patient as far as registration and location  Meds may help; chatix (Varenicline); Zyban (Bupropion SR); Nicotine Replacement (gum; lozenges; patches; etc.)   30 pack yr smoking hx: Educated regarding LDCT; To discuss with MD at next fup.  [place] helpline to help you quit  1-800-QUIT-NOW (319)295-8552).         This is a list of the screening recommended for you and due dates:  Health Maintenance  Topic Date Due  .  Hepatitis C: One time screening is recommended by Center for Disease Control  (CDC) for  adults born from [birth year] through 1965.   [birth year]  . Colon Cancer Screening  07/73/1995  . Tetanus Vaccine  01/73/2015  . Pneumonia vaccines (2 of 2 - PCV13) 08/73/2015  .  Flu Shot  03/06/2017        Fall Prevention in the Home Falls can cause injuries. They can happen to people of all ages. There are many things you can do to make your home safe and to help prevent falls. What can I do on the outside of my home?  Regularly fix the edges of walkways and driveways and fix any cracks.  Remove anything that might make you trip as you walk through a door, such as a raised step or threshold.  Trim any bushes or trees on the path to your home.  Use bright outdoor  lighting.  Clear any walking paths of anything that might make someone trip, such as rocks or tools.  Regularly check to see if handrails are loose or broken. Make sure that both sides of any steps have handrails.  Any raised decks and porches should have guardrails on the edges.  Have any leaves, snow, or ice cleared regularly.  Use sand or salt on walking paths during winter.  Clean up any spills in your garage right away. This includes oil or grease spills. What can I do in the bathroom?  Use night lights.  Install grab bars by the toilet and in the tub and shower. Do not use towel bars as grab bars.  Use non-skid mats or decals in the tub or shower.  If you need to sit down in the shower, use a plastic, non-slip stool.  Keep the floor dry. Clean up any water that spills on the floor as soon as it happens.  Remove soap buildup in the tub or shower regularly.  Attach bath mats securely with double-sided non-slip rug tape.  Do not have throw rugs and other things on the floor that can make you trip. What can I do in the bedroom?  Use night lights.  Make sure that you have a light by your bed that is easy to reach.  Do not use any sheets or blankets that are too big for your bed. They should not hang down onto the floor.  Have a firm chair that has side arms. You can use this for support while you get dressed.  Do not have throw rugs and other things on the floor that can make you trip. What can I do in the kitchen?  Clean up any spills right away.  Avoid walking on wet floors.  Keep items that you use a lot in easy-to-reach places.  If you need to reach something above you, use a strong step stool that has a grab bar.  Keep electrical cords out of the way.  Do not use floor polish or wax that makes floors slippery. If you must use wax, use non-skid floor wax.  Do not have throw rugs and other things on the floor that can make you trip. What can I do with my  stairs?  Do not leave any items on the stairs.  Make sure that there are handrails on both sides of the stairs and use them. Fix handrails that are broken or loose. Make sure that handrails are as long as the stairways.  Check any carpeting to make sure that it is firmly attached to the stairs. Fix any carpet that is loose or worn.  Avoid having throw rugs at the top or bottom of the stairs. If you do have throw rugs, attach them to the floor with carpet tape.  Make sure that you have a light switch at the top  of the stairs and the bottom of the stairs. If you do not have them, ask someone to add them for you. What else can I do to help prevent falls?  Wear shoes that: ? Do not have high heels. ? Have rubber bottoms. ? Are comfortable and fit you well. ? Are closed at the toe. Do not wear sandals.  If you use a stepladder: ? Make sure that it is fully opened. Do not climb a closed stepladder. ? Make sure that both sides of the stepladder are locked into place. ? Ask someone to hold it for you, if possible.  Clearly mark and make sure that you can see: ? Any grab bars or handrails. ? First and last steps. ? Where the edge of each step is.  Use tools that help you move around (mobility aids) if they are needed. These include: ? Canes. ? Walkers. ? Scooters. ? Crutches.  Turn on the lights when you go into a dark area. Replace any light bulbs as soon as they burn out.  Set up your furniture so you have a clear path. Avoid moving your furniture around.  If any of your floors are uneven, fix them.  If there are any pets around you, be aware of where they are.  Review your medicines with your doctor. Some medicines can make you feel dizzy. This can increase your chance of falling. Ask your doctor what other things that you can do to help prevent falls. This information is not intended to replace advice given to you by your health care provider. Make sure you discuss any  questions you have with your health care provider. Document Released: 05/19/2009 Document Revised: 12/29/2015 Document Reviewed: 08/27/2014 Elsevier Interactive Patient Education  2018 [place] Maintenance, Male A healthy lifestyle and preventive care is important for your health and wellness. Ask your health care provider about what schedule of regular examinations is right for you. What should I know about weight and diet? Eat a Healthy Diet  Eat plenty of vegetables, fruits, whole grains, low-fat dairy products, and lean protein.  Do not eat a lot of foods high in solid fats, added sugars, or salt.  Maintain a Healthy Weight Regular exercise can help you achieve or maintain a healthy weight. You should:  Do at least 150 minutes of exercise each week. The exercise should increase your heart rate and make you sweat (moderate-intensity exercise).  Do strength-training exercises at least twice a week.  Watch Your Levels of Cholesterol and Blood Lipids  Have your blood tested for lipids and cholesterol every 5 years starting at 73 years of age. If you are at high risk for heart disease, you should start having your blood tested when you are 73 years old. You may need to have your cholesterol levels checked more often if: ? Your lipid or cholesterol levels are high. ? You are older than 73 years of age. ? You are at high risk for heart disease.  What should I know about cancer screening? Many types of cancers can be detected early and may often be prevented. Lung Cancer  You should be screened every year for lung cancer if: ? You are a current smoker who has smoked for at least 30 years. ? You are a former smoker who has quit within the past 15 years.  Talk to your health care provider about your screening options, when you should start screening, and how often you should be screened.  Colorectal Cancer  Routine colorectal cancer screening usually begins at 73  years of age and should be repeated every 5-10 years until you are 73 years old. You may need to be screened more often if early forms of precancerous polyps or small growths are found. Your health care provider may recommend screening at an earlier age if you have risk factors for colon cancer.  Your health care provider may recommend using home test kits to check for hidden blood in the stool.  A small camera at the end of a tube can be used to examine your colon (sigmoidoscopy or colonoscopy). This checks for the earliest forms of colorectal cancer.  Prostate and Testicular Cancer  Depending on your age and overall health, your health care provider may do certain tests to screen for prostate and testicular cancer.  Talk to your health care provider about any symptoms or concerns you have about testicular or prostate cancer.  Skin Cancer  Check your skin from head to toe regularly.  Tell your health care provider about any new moles or changes in moles, especially if: ? There is a change in a mole's size, shape, or color. ? You have a mole that is larger than a pencil eraser.  Always use sunscreen. Apply sunscreen liberally and repeat throughout the day.  Protect yourself by wearing long sleeves, pants, a wide-brimmed hat, and sunglasses when outside.  What should I know about heart disease, diabetes, and high blood pressure?  If you are 60-83 years of age, have your blood pressure checked every 3-5 years. If you are 56 years of age or older, have your blood pressure checked every year. You should have your blood pressure measured twice-once when you are at a hospital or clinic, and once when you are not at a hospital or clinic. Record the average of the two measurements. To check your blood pressure when you are not at a hospital or clinic, you can use: ? An automated blood pressure machine at a pharmacy. ? A home blood pressure monitor.  Talk to your health care provider about your  target blood pressure.  If you are between 29-51 years old, ask your health care provider if you should take aspirin to prevent heart disease.  Have regular diabetes screenings by checking your fasting blood sugar level. ? If you are at a normal weight and have a low risk for diabetes, have this test once every three years after the age of 3. ? If you are overweight and have a high risk for diabetes, consider being tested at a younger age or more often.  A one-time screening for abdominal aortic aneurysm (AAA) by ultrasound is recommended for men aged 63-75 years who are current or former smokers. What should I know about preventing infection? Hepatitis B If you have a higher risk for hepatitis B, you should be screened for this virus. Talk with your health care provider to find out if you are at risk for hepatitis B infection. Hepatitis C Blood testing is recommended for:  Everyone born from [birth year] through 1967.  Anyone with known risk factors for hepatitis C.  Sexually Transmitted Diseases (STDs)  You should be screened each year for STDs including gonorrhea and chlamydia if: ? You are sexually active and are younger than 73 years of age. ? You are older than 73 years of age and your health care provider tells you that you are at risk for this type of infection. ? Your sexual activity has  changed since you were last screened and you are at an increased risk for chlamydia or gonorrhea. Ask your health care provider if you are at risk.  Talk with your health care provider about whether you are at high risk of being infected with HIV. Your health care provider may recommend a prescription medicine to help prevent HIV infection.  What else can I do?  Schedule regular health, dental, and eye exams.  Stay current with your vaccines (immunizations).  Do not use any tobacco products, such as cigarettes, chewing tobacco, and e-cigarettes. If you need help quitting, ask your health care  provider.  Limit alcohol intake to no more than 2 drinks per day. One drink equals 12 ounces of beer, 5 ounces of wine, or 1 ounces of hard liquor.  Do not use street drugs.  Do not share needles.  Ask your health care provider for help if you need support or information about quitting drugs.  Tell your health care provider if you often feel depressed.  Tell your health care provider if you have ever been abused or do not feel safe at home. This information is not intended to replace advice given to you by your health care provider. Make sure you discuss any questions you have with your health care provider. Document Released: 01/19/2008 Document Revised: 03/21/2016 Document Reviewed: 04/26/2015 Elsevier Interactive Patient Education  2018 [employer].   Steps to Quit Smoking Smoking tobacco can be bad for your health. It can also affect almost every organ in your body. Smoking puts you and people around you at risk for many serious long-lasting (chronic) diseases. Quitting smoking is hard, but it is one of the best things that you can do for your health. It is never too late to quit. What are the benefits of quitting smoking? When you quit smoking, you lower your risk for getting serious diseases and conditions. They can include:  Lung cancer or lung disease.  Heart disease.  Stroke.  Heart attack.  Not being able to have children (infertility).  Weak bones (osteoporosis) and broken bones (fractures).  If you have coughing, wheezing, and shortness of breath, those symptoms may get better when you quit. You may also get sick less often. If you are pregnant, quitting smoking can help to lower your chances of having a baby of low birth weight. What can I do to help me quit smoking? Talk with your doctor about what can help you quit smoking. Some things you can do (strategies) include:  Quitting smoking totally, instead of slowly cutting back how much you smoke over a period of  time.  Going to in-person counseling. You are more likely to quit if you go to many counseling sessions.  Using resources and support systems, such as: ? Database administrator with a Social worker. ? Phone quitlines. ? Careers information officer. ? Support groups or group counseling. ? Text messaging programs. ? Mobile phone apps or applications.  Taking medicines. Some of these medicines may have nicotine in them. If you are pregnant or breastfeeding, do not take any medicines to quit smoking unless your doctor says it is okay. Talk with your doctor about counseling or other things that can help you.  Talk with your doctor about using more than one strategy at the same time, such as taking medicines while you are also going to in-person counseling. This can help make quitting easier. What things can I do to make it easier to quit? Quitting smoking might feel very hard  at first, but there is a lot that you can do to make it easier. Take these steps:  Talk to your family and friends. Ask them to support and encourage you.  Call phone quitlines, reach out to support groups, or work with a Social worker.  Ask people who smoke to not smoke around you.  Avoid places that make you want (trigger) to smoke, such as: ? Bars. ? Parties. ? Smoke-break areas at work.  Spend time with people who do not smoke.  Lower the stress in your life. Stress can make you want to smoke. Try these things to help your stress: ? Getting regular exercise. ? Deep-breathing exercises. ? Yoga. ? Meditating. ? Doing a body scan. To do this, close your eyes, focus on one area of your body at a time from head to toe, and notice which parts of your body are tense. Try to relax the muscles in those areas.  Download or buy apps on your mobile phone or tablet that can help you stick to your quit plan. There are many free apps, such as QuitGuide from the [employer] Office manager for Disease Control and Prevention). You can find more support from  smokefree.gov and other websites.  This information is not intended to replace advice given to you by your health care provider. Make sure you discuss any questions you have with your health care provider. Document Released: 05/19/2009 Document Revised: 03/20/2016 Document Reviewed: 12/07/2014 Elsevier Interactive Patient Education  2018 [employer].

## 2017-06-12 DIAGNOSIS — C44712 Basal cell carcinoma of skin of right lower limb, including hip: Secondary | ICD-10-CM | POA: Diagnosis not present

## 2017-06-18 ENCOUNTER — Other Ambulatory Visit: Payer: Self-pay | Admitting: Cardiology

## 2017-06-18 NOTE — Telephone Encounter (Signed)
REFILL 

## 2017-07-07 NOTE — Progress Notes (Signed)
Cardiology Office Note   Date:  07/08/2017   ID:  Cory [name], DOB June 13, 1946, MRN [id number]  PCP:  [name], MD  Cardiologist:   [name], MD    Chief Complaint  Patient presents with  . Atrial Fibrillation      History of Present Illness: Cory [name] is a 71 y.o. male who presents for evaluation of palpitations.  Stress testing suggested no ischemia. However, a monitor demonstrated paroxysmal atrial fibrillation. This was during the summer of 2016. He returns for follow up.  He has done well since I last saw him.  The patient denies any new symptoms such as chest discomfort, neck or arm discomfort. There has been no new shortness of breath, PND or orthopnea. There have been no reported palpitations, presyncope or syncope.  He is active in the yard and with tennis and walking.  He does not feel any atrial fib.  He still smokes.    Past Medical History:  Diagnosis Date  . Aortic stenosis    a. Mild by echo 04/2015.  Marland Kitchen PAF (paroxysmal atrial fibrillation) ([place])   . Tobacco abuse     Past Surgical History:  Procedure Laterality Date  . CERVICAL SPINE SURGERY    . LEG SURGERY     Trauma  . RETINAL DETACHMENT SURGERY       Current Outpatient Medications  Medication Sig Dispense Refill  . cetirizine (ZYRTEC) 10 MG tablet Take 1 tablet (10 mg total) by mouth daily. 30 tablet 11  . loratadine (CLARITIN) 10 MG tablet Take 10 mg by mouth as needed for allergies.    . metoprolol succinate (TOPROL-XL) 25 MG 24 hr tablet Take 1 tablet (25 mg total) daily by mouth. KEEP OV. 90 tablet 0  . ranitidine (ZANTAC) 150 MG tablet Take 1 tablet (150 mg total) by mouth 2 (two) times daily. 60 tablet 0  . triamcinolone cream (KENALOG) 0.1 % Apply 1 application topically 2 (two) times daily. 30 g 0  . varenicline (CHANTIX CONTINUING MONTH PAK) 1 MG tablet Take 1 tablet (1 mg total) by mouth 2 (two) times daily. 60 tablet 3  . varenicline (CHANTIX STARTING MONTH PAK) 0.5 MG  X 11 & 1 MG X 42 tablet Take one 0.5 mg tablet by mouth once daily for 3 days, then increase to one 0.5 mg tablet twice daily for 4 days, then increase to one 1 mg tablet twice daily. 53 tablet 0   No current facility-administered medications for this visit.     Allergies:   Azithromycin and Mucinex [guaifenesin er]    ROS:  Please see the history of present illness.   Otherwise, review of systems are positive for none.   All other systems are reviewed and negative.    PHYSICAL EXAM: VS:  BP (!) 177/89   Pulse (!) 49   Ht 6\' 4"  (1.93 m)   Wt 226 lb (102.5 kg)   BMI 27.51 kg/m  , BMI Body mass index is 27.51 kg/m.  GENERAL:  Well appearing NECK:  No jugular venous distention, waveform within normal limits, carotid upstroke brisk and symmetric, no bruits, no thyromegaly LUNGS:  Clear to auscultation bilaterally CHEST:  Unremarkable HEART:  PMI not displaced or sustained,S1 and S2 within normal limits, no S3, no S4, no clicks, no rubs, 2/6 apical systolic murmur radiating slightly at the aortic outflow tract, no diastolic murmurs ABD:  Flat, positive bowel sounds normal in frequency in pitch, no bruits, no  rebound, no guarding, no midline pulsatile mass, no hepatomegaly, no splenomegaly EXT:  2 plus pulses throughout, no edema, no cyanosis no clubbing  EKG:  EKG is  ordered today. Sinus bradycardia, rate 49, axis within normal limits, intervals within normal limits, no acute ST-T wave changes.  Recent Labs: No results found for requested labs within last 8760 hours.    Lipid Panel    Component Value Date/Time   CHOL 168 02/15/2015 0927   TRIG 104 02/15/2015 0927   HDL 43 02/15/2015 0927   CHOLHDL 3.9 02/15/2015 0927   VLDL 21 02/15/2015 0927   LDLCALC 104 (H) 02/15/2015 0927      Wt Readings from Last 3 Encounters:  07/08/17 226 lb (102.5 kg)  06/05/17 226 lb (102.5 kg)  12/10/16 225 lb 12.8 oz (102.4 kg)      Other studies Reviewed: Additional studies/ records that  were reviewed today include:   None Review of the above records demonstrates:      ASSESSMENT AND PLAN:  ATRIAL FIB:      He's having very few paroxysms. [name] has a CHA2DS2 - VASc score of 1.  He does understand that he is age 67 she has asked to we will talk about anticoagulation either before that if he has increasing evidence or at least after that if there is continued evidence of the fibrillation.  He prefer not to take anticoagulation until that time.  TOBACCO:  We again talked about this.  He agrees to try Chantix.  HTN: The blood pressure is elevated today but he says this is very unusual he is going to keep a 2-week blood pressure diary and we talked about specifics of this.  He understands that if he has a diagnosis of high blood pressure I will be suggesting an anticoagulant now as he is score would be 2.    AS:  This is again mild by exam.  No change in therapy.  No further imaging at this point.     Current medicines are reviewed at length with the patient today.  The patient does not have concerns regarding medicines.  The following changes have been made:  As above  Labs/ tests ordered today include:    Orders Placed This Encounter  Procedures  . EKG 12-Lead     Disposition:   FU with me in 6 months.   (Or sooner based on on the results of the BP diary.)     Signed, [name], MD  07/08/2017 9:46 AM    [place]

## 2017-07-08 ENCOUNTER — Ambulatory Visit: Payer: Medicare HMO | Admitting: Cardiology

## 2017-07-08 ENCOUNTER — Encounter: Payer: Self-pay | Admitting: Cardiology

## 2017-07-08 VITALS — BP 177/89 | HR 49 | Ht 76.0 in | Wt 226.0 lb

## 2017-07-08 DIAGNOSIS — I4891 Unspecified atrial fibrillation: Secondary | ICD-10-CM

## 2017-07-08 DIAGNOSIS — I10 Essential (primary) hypertension: Secondary | ICD-10-CM

## 2017-07-08 DIAGNOSIS — Z720 Tobacco use: Secondary | ICD-10-CM

## 2017-07-08 MED ORDER — VARENICLINE TARTRATE 1 MG PO TABS: 1.0000 mg | ORAL_TABLET | Freq: Two times a day (BID) | ORAL | 3 refills | Status: DC

## 2017-07-08 MED ORDER — VARENICLINE TARTRATE 0.5 MG X 11 & 1 MG X 42 PO MISC: ORAL | 0 refills | Status: DC

## 2017-07-08 NOTE — Patient Instructions (Signed)
Medication Instructions:  START- Chantix  If you need a refill on your cardiac medications before your next appointment, please call your pharmacy.  Labwork: None Ordered   Testing/Procedures: None Ordered  Keep a daily blood pressure diary  Follow-Up: Your physician wants you to follow-up in: 6 Months. You should receive a reminder letter in the mail two months in advance. If you do not receive a letter, please call our office [phone].    Thank you for choosing [facility] at [facility]!!

## 2017-08-02 ENCOUNTER — Encounter: Payer: Self-pay | Admitting: Family Medicine

## 2017-08-02 ENCOUNTER — Ambulatory Visit: Payer: Medicare HMO | Admitting: Family Medicine

## 2017-08-02 ENCOUNTER — Other Ambulatory Visit: Payer: Self-pay

## 2017-08-02 VITALS — BP 142/90 | HR 61 | Temp 98.1°F | Ht 77.0 in | Wt 226.0 lb

## 2017-08-02 DIAGNOSIS — J019 Acute sinusitis, unspecified: Secondary | ICD-10-CM | POA: Diagnosis not present

## 2017-08-02 MED ORDER — AMOXICILLIN-POT CLAVULANATE 875-125 MG PO TABS: 1.0000 | ORAL_TABLET | Freq: Two times a day (BID) | ORAL | 0 refills | Status: DC

## 2017-08-02 MED ORDER — FLUTICASONE PROPIONATE 50 MCG/ACT NA SUSP: 2.0000 | Freq: Every day | NASAL | 6 refills | Status: DC

## 2017-08-02 NOTE — Patient Instructions (Addendum)
   IF you received an x-ray today, you will receive an invoice from [facility]. Please contact  Radiology at [phone] with questions or concerns regarding your invoice.   IF you received labwork today, you will receive an invoice from [facility]. Please contact [facility] at [phone] with questions or concerns regarding your invoice.   Our billing staff will not be able to assist you with questions regarding bills from these companies.  You will be contacted with the lab results as soon as they are available. The fastest way to get your results is to activate your My Chart account. Instructions are located on the last page of this paperwork. If you have not heard from us regarding the results in 2 weeks, please contact this office.      Sinusitis, Adult Sinusitis is soreness and inflammation of your sinuses. Sinuses are hollow spaces in the bones around your face. Your sinuses are located:  Around your eyes.  In the middle of your forehead.  Behind your nose.  In your cheekbones.  Your sinuses and nasal passages are lined with a stringy fluid (mucus). Mucus normally drains out of your sinuses. When your nasal tissues become inflamed or swollen, the mucus can become trapped or blocked so air cannot flow through your sinuses. This allows bacteria, viruses, and funguses to grow, which leads to infection. Sinusitis can develop quickly and last for 7?10 days (acute) or for more than 12 weeks (chronic). Sinusitis often develops after a cold. What are the causes? This condition is caused by anything that creates swelling in the sinuses or stops mucus from draining, including:  Allergies.  Asthma.  Bacterial or viral infection.  Abnormally shaped bones between the nasal passages.  Nasal growths that contain mucus (nasal polyps).  Narrow sinus openings.  Pollutants, such as chemicals or irritants in the air.  A foreign object stuck in the nose.  A fungal  infection. This is rare.  What increases the risk? The following factors may make you more likely to develop this condition:  Having allergies or asthma.  Having had a recent cold or respiratory tract infection.  Having structural deformities or blockages in your nose or sinuses.  Having a weak immune system.  Doing a lot of swimming or diving.  Overusing nasal sprays.  Smoking.  What are the signs or symptoms? The main symptoms of this condition are pain and a feeling of pressure around the affected sinuses. Other symptoms include:  Upper toothache.  Earache.  Headache.  Bad breath.  Decreased sense of smell and taste.  A cough that may get worse at night.  Fatigue.  Fever.  Thick drainage from your nose. The drainage is often green and it may contain pus (purulent).  Stuffy nose or congestion.  Postnasal drip. This is when extra mucus collects in the throat or back of the nose.  Swelling and warmth over the affected sinuses.  Sore throat.  Sensitivity to light.  How is this diagnosed? This condition is diagnosed based on symptoms, a medical history, and a physical exam. To find out if your condition is acute or chronic, your health care provider may:  Look in your nose for signs of nasal polyps.  Tap over the affected sinus to check for signs of infection.  View the inside of your sinuses using an imaging device that has a light attached (endoscope).  If your health care provider suspects that you have chronic sinusitis, you may also:  Be tested for allergies.    Have a sample of mucus taken from your nose (nasal culture) and checked for bacteria.  Have a mucus sample examined to see if your sinusitis is related to an allergy.  If your sinusitis does not respond to treatment and it lasts longer than 8 weeks, you may have an MRI or CT scan to check your sinuses. These scans also help to determine how severe your infection is. In rare cases, a bone  biopsy may be done to rule out more serious types of fungal sinus disease. How is this treated? Treatment for sinusitis depends on the cause and whether your condition is chronic or acute. If a virus is causing your sinusitis, your symptoms will go away on their own within 10 days. You may be given medicines to relieve your symptoms, including:  Topical nasal decongestants. They shrink swollen nasal passages and let mucus drain from your sinuses.  Antihistamines. These drugs block inflammation that is triggered by allergies. This can help to ease swelling in your nose and sinuses.  Topical nasal corticosteroids. These are nasal sprays that ease inflammation and swelling in your nose and sinuses.  Nasal saline washes. These rinses can help to get rid of thick mucus in your nose.  If your condition is caused by bacteria, you will be given an antibiotic medicine. If your condition is caused by a fungus, you will be given an antifungal medicine. Surgery may be needed to correct underlying conditions, such as narrow nasal passages. Surgery may also be needed to remove polyps. Follow these instructions at home: Medicines  Take, use, or apply over-the-counter and prescription medicines only as told by your health care provider. These may include nasal sprays.  If you were prescribed an antibiotic medicine, take it as told by your health care provider. Do not stop taking the antibiotic even if you start to feel better. Hydrate and Humidify  Drink enough water to keep your urine clear or pale yellow. Staying hydrated will help to thin your mucus.  Use a cool mist humidifier to keep the humidity level in your home above 50%.  Inhale steam for 10-15 minutes, 3-4 times a day or as told by your health care provider. You can do this in the bathroom while a hot shower is running.  Limit your exposure to cool or dry air. Rest  Rest as much as possible.  Sleep with your head raised  (elevated).  Make sure to get enough sleep each night. General instructions  Apply a warm, moist washcloth to your face 3-4 times a day or as told by your health care provider. This will help with discomfort.  Wash your hands often with soap and water to reduce your exposure to viruses and other germs. If soap and water are not available, use hand sanitizer.  Do not smoke. Avoid being around people who are smoking (secondhand smoke).  Keep all follow-up visits as told by your health care provider. This is important. Contact a health care provider if:  You have a fever.  Your symptoms get worse.  Your symptoms do not improve within 10 days. Get help right away if:  You have a severe headache.  You have persistent vomiting.  You have pain or swelling around your face or eyes.  You have vision problems.  You develop confusion.  Your neck is stiff.  You have trouble breathing. This information is not intended to replace advice given to you by your health care provider. Make sure you discuss any questions you   have with your health care provider. Document Released: 07/23/2005 Document Revised: 03/18/2016 Document Reviewed: 05/18/2015 Elsevier Interactive Patient Education  2018 [employer]  

## 2017-08-02 NOTE — Progress Notes (Signed)
12/28/201812:23 PM  Cory [name] Apr 09, 1946, 71 y.o. male [id number]  Chief Complaint  Patient presents with  . URI    COUGHING, CONGESTION    HPI:   Patient is a 72 y.o. male who presents today for about a month of nasal congestion, sinus pressure, rhinorrhea, cough, that has not resolved with aggressive OTC treatment. Denies any fever or chills. Denies any sob. Patient is a current smoker.  Depression screen [facility] 2/9 08/02/2017 06/05/2017 12/10/2016  Decreased Interest 0 0 0  Down, Depressed, Hopeless 0 0 0  PHQ - 2 Score 0 0 0    Allergies  Allergen Reactions  . Azithromycin Diarrhea  . Mucinex [Guaifenesin Er]     TAKES MUCINEX WITH NO PROBLEMS     Prior to Admission medications   Medication Sig Start Date End Date Taking? Authorizing Provider  cetirizine (ZYRTEC) 10 MG tablet Take 1 tablet (10 mg total) by mouth daily. 12/10/16  Yes Jaynee Eagles, PA-C  metoprolol succinate (TOPROL-XL) 25 MG 24 hr tablet Take 1 tablet (25 mg total) daily by mouth. KEEP OV. 06/18/17  Yes [name], MD  varenicline (CHANTIX CONTINUING MONTH PAK) 1 MG tablet Take 1 tablet (1 mg total) by mouth 2 (two) times daily. 07/08/17  Yes [name], MD  varenicline (CHANTIX STARTING MONTH PAK) 0.5 MG X 11 & 1 MG X 42 tablet Take one 0.5 mg tablet by mouth once daily for 3 days, then increase to one 0.5 mg tablet twice daily for 4 days, then increase to one 1 mg tablet twice daily. 07/08/17  Yes [name], MD    Past Medical History:  Diagnosis Date  . Aortic stenosis    a. Mild by echo 04/2015.  Marland Kitchen PAF (paroxysmal atrial fibrillation) ([place])   . Tobacco abuse     Past Surgical History:  Procedure Laterality Date  . CERVICAL SPINE SURGERY    . LEG SURGERY     Trauma  . RETINAL DETACHMENT SURGERY      Social History   Tobacco Use  . Smoking status: Current Every Day Smoker    Packs/day: 1.00    Years: 50.00    Pack years: 50.00    Types: Cigarettes  . Smokeless tobacco:  Never Used  Substance Use Topics  . Alcohol use: Yes    Alcohol/week: 0.0 oz    Comment: 4 to 5 times a week; beer, wine and liquor     Family History  Problem Relation Age of Onset  . Ovarian cancer Mother   . Alcoholism Father   . Breast cancer Sister   . Ovarian cancer Sister     ROS Per hpi  OBJECTIVE:  Blood pressure (!) 142/90, pulse 61, temperature 98.1 F (36.7 C), temperature source Oral, height 6\' 5"  (1.956 m), weight 226 lb (102.5 kg), SpO2 95 %.  Physical Exam  Constitutional: He is oriented to person, place, and time and well-developed, well-nourished, and in no distress.  HENT:  Head: Normocephalic and atraumatic.  Right Ear: Hearing, tympanic membrane, external ear and ear canal normal.  Left Ear: Hearing, tympanic membrane, external ear and ear canal normal.  Nose: Right sinus exhibits maxillary sinus tenderness. Left sinus exhibits maxillary sinus tenderness.  Mouth/Throat: Oropharynx is clear and moist. No oropharyngeal exudate.  Eyes: Conjunctivae and EOM are normal. Pupils are equal, round, and reactive to light.  Neck: Neck supple.  Cardiovascular: Normal rate and regular rhythm. Exam reveals no gallop and no friction rub.  No murmur  heard. Pulmonary/Chest: Effort normal and breath sounds normal. He has no wheezes. He has no rales.  Lymphadenopathy:    He has no cervical adenopathy.  Neurological: He is alert and oriented to person, place, and time. Gait normal.  Skin: Skin is warm and dry.    ASSESSMENT and PLAN  1. Acute non-recurrent sinusitis, unspecified location Discussed supportive measures, new meds r/se/b and RTC precautions. Patient educational handout given. - amoxicillin-clavulanate (AUGMENTIN) 875-125 MG tablet; Take 1 tablet by mouth 2 (two) times daily. - fluticasone (FLONASE) 50 MCG/ACT nasal spray; Place 2 sprays into both nostrils daily.  Return if symptoms worsen or fail to improve.    Rutherford Guys, MD Primary Care at  [facility] [place], [place] [address] Ph.  [phone] Fax [phone]

## 2017-08-28 ENCOUNTER — Encounter: Payer: Self-pay | Admitting: Family Medicine

## 2017-08-28 ENCOUNTER — Ambulatory Visit (INDEPENDENT_AMBULATORY_CARE_PROVIDER_SITE_OTHER): Payer: Medicare HMO | Admitting: Family Medicine

## 2017-08-28 VITALS — BP 140/90 | HR 60 | Temp 98.5°F | Ht 77.0 in | Wt 227.0 lb

## 2017-08-28 DIAGNOSIS — J0191 Acute recurrent sinusitis, unspecified: Secondary | ICD-10-CM | POA: Diagnosis not present

## 2017-08-28 MED ORDER — PREDNISONE 10 MG PO TABS: ORAL_TABLET | ORAL | 0 refills | Status: DC

## 2017-08-28 MED ORDER — AMOXICILLIN-POT CLAVULANATE 875-125 MG PO TABS: 1.0000 | ORAL_TABLET | Freq: Two times a day (BID) | ORAL | 0 refills | Status: DC

## 2017-08-28 NOTE — Progress Notes (Signed)
Subjective:     Patient ID: Cory [name], male   DOB: 09-Oct-1945, 72 y.o.   MRN: [id number]  HPI Patient seen with some recurrent sinus congestion and cough over the past several weeks. He went to urgent care and was prescribed Augmentin and did have some initial improvement but symptoms seemed to relapse after he finished this. He's had some bloody nasal discharge from the left side. No headaches. No fever. Only minimal facial discomfort. Using Flonase regularly.  Also tried some saline irrigation  Past Medical History:  Diagnosis Date  . Aortic stenosis    a. Mild by echo 04/2015.  Marland Kitchen PAF (paroxysmal atrial fibrillation) ([place])   . Tobacco abuse    Past Surgical History:  Procedure Laterality Date  . CERVICAL SPINE SURGERY    . LEG SURGERY     Trauma  . RETINAL DETACHMENT SURGERY      reports that he has been smoking cigarettes.  He has a 50.00 pack-year smoking history. he has never used smokeless tobacco. He reports that he drinks alcohol. He reports that he does not use drugs. family history includes Alcoholism in his father; Breast cancer in his sister; Ovarian cancer in his mother and sister. Allergies  Allergen Reactions  . Azithromycin Diarrhea  . Mucinex [Guaifenesin Er]     TAKES MUCINEX WITH NO PROBLEMS      Review of Systems  Constitutional: Positive for fatigue. Negative for chills and fever.  HENT: Positive for congestion and sinus pressure. Negative for sore throat.   Respiratory: Positive for cough.        Objective:   Physical Exam  Constitutional: He appears well-developed and well-nourished.  HENT:  Right Ear: External ear normal.  Left Ear: External ear normal.  Mouth/Throat: Oropharynx is clear and moist.  Thick yellow nasal mucus left naris  Cardiovascular: Normal rate and regular rhythm.       Assessment:     Probable recurrent acute sinusitis. May have component of allergies as well    Plan:     -We decided on another go round with  Augmentin -Continue saline nasal irrigation Continue with Flonase- -Prednisone taper over the next week  -Consider limited maxillofacial CT of sinuses if not improved in 2 weeks   [name] MD [facility]

## 2017-08-28 NOTE — Patient Instructions (Signed)
Touch base in 2 weeks if no better Stay well hydrated Continue with Flonase and nasal saline for now.

## 2017-09-18 ENCOUNTER — Other Ambulatory Visit: Payer: Self-pay | Admitting: Cardiology

## 2017-10-11 ENCOUNTER — Ambulatory Visit (INDEPENDENT_AMBULATORY_CARE_PROVIDER_SITE_OTHER): Payer: Medicare HMO | Admitting: Family Medicine

## 2017-10-11 ENCOUNTER — Ambulatory Visit: Payer: Medicare HMO | Admitting: Physician Assistant

## 2017-10-11 ENCOUNTER — Encounter: Payer: Self-pay | Admitting: Family Medicine

## 2017-10-11 VITALS — BP 138/82 | HR 53 | Temp 98.0°F | Wt 234.0 lb

## 2017-10-11 DIAGNOSIS — R05 Cough: Secondary | ICD-10-CM

## 2017-10-11 DIAGNOSIS — J0101 Acute recurrent maxillary sinusitis: Secondary | ICD-10-CM

## 2017-10-11 DIAGNOSIS — R059 Cough, unspecified: Secondary | ICD-10-CM

## 2017-10-11 DIAGNOSIS — F17210 Nicotine dependence, cigarettes, uncomplicated: Secondary | ICD-10-CM

## 2017-10-11 MED ORDER — DOXYCYCLINE HYCLATE 100 MG PO TABS: 100.0000 mg | ORAL_TABLET | Freq: Two times a day (BID) | ORAL | 0 refills | Status: AC

## 2017-10-11 MED ORDER — PREDNISONE 10 MG PO TABS: ORAL_TABLET | ORAL | 0 refills | Status: DC

## 2017-10-11 MED ORDER — BENZONATATE 100 MG PO CAPS: 100.0000 mg | ORAL_CAPSULE | Freq: Two times a day (BID) | ORAL | 0 refills | Status: DC | PRN

## 2017-10-11 NOTE — Patient Instructions (Addendum)

## 2017-10-11 NOTE — Progress Notes (Signed)
Subjective:    Patient ID: Cory [name], male    DOB: Feb 22, 1946, 72 y.o.   MRN: [id number]  No chief complaint on file.   HPI Patient was seen today for ongoing concern.  Patient endorses chest congestion with increased sputum production, head congestion times 2 weeks as well as coughing at night, scratchy throat, and rhinorrhea.  Patient has tried Mucinex, Flonase, Zyrtec.  Patient endorses similar symptoms on 1/23 and on 08/02/17 when he was diagnosed with sinus infection.  Patient finished a course of Augmentin and a prednisone taper in January.  Patient was to notify clinic if no improvement in symptoms for possible limited maxillofacial CT of sinuses.  Patient endorses current everyday smoking, 1 pack/day.  Patient is thought about quitting.  Patient attempted to obtain Chantix, however is having difficulty getting his insurance to pay for it.  Past Medical History:  Diagnosis Date  . Aortic stenosis    a. Mild by echo 04/2015.  Marland Kitchen PAF (paroxysmal atrial fibrillation) ([place])   . Tobacco abuse     Allergies  Allergen Reactions  . Azithromycin Diarrhea  . Mucinex [Guaifenesin Er]     TAKES MUCINEX WITH NO PROBLEMS     ROS General: Denies fever, chills, night sweats, changes in weight, changes in appetite HEENT: Denies headaches, ear pain, changes in vision,  + head congestion, scratchy throat, rhinorrhea CV: Denies CP, palpitations, SOB, orthopnea Pulm: Denies SOB, cough, wheezing  + chest congestion GI: Denies abdominal pain, nausea, vomiting, diarrhea, constipation GU: Denies dysuria, hematuria, frequency, vaginal discharge Msk: Denies muscle cramps, joint pains Neuro: Denies weakness, numbness, tingling Skin: Denies rashes, bruising Psych: Denies depression, anxiety, hallucinations     Objective:    Blood pressure 138/82, pulse (!) 53, temperature 98 F (36.7 C), temperature source Oral, weight 234 lb (106.1 kg), SpO2 97 %.   Gen. Pleasant, well-nourished, in no  distress, normal affect   HEENT: [place]/AT, face symmetric, no scleral icterus, PERRLA, nares patent without drainage, pharynx without erythema or exudate.  Mild TTP of maxillary sinuses bilaterally.  TMs normal bilaterally.  No cervical lymphadenopathy. Lungs: no accessory muscle use, CTAB, no wheezes or rales Cardiovascular: RRR, no m/r/g, no peripheral edema Abdomen: BS present, soft, NT/ND Neuro:  A&Ox3, CN II-XII intact, normal gait   Wt Readings from Last 3 Encounters:  10/11/17 234 lb (106.1 kg)  08/28/17 227 lb (103 kg)  08/02/17 226 lb (102.5 kg)    Lab Results  Component Value Date   WBC 9.2 04/01/2013   HGB 16.5 04/01/2013   HCT 48.1 04/01/2013   PLT 216.0 04/01/2013   GLUCOSE 94 04/01/2013   CHOL 168 02/15/2015   TRIG 104 02/15/2015   HDL 43 02/15/2015   LDLCALC 104 (H) 02/15/2015   ALT 18 04/01/2013   AST 17 04/01/2013   NA 137 04/01/2013   K 3.7 04/01/2013   CL 104 04/01/2013   CREATININE 0.9 04/01/2013   BUN 12 04/01/2013   CO2 25 04/01/2013   TSH 0.77 03/08/2015   PSA 0.97 07/14/2009    Assessment/Plan:  Acute recurrent maxillary sinusitis  -given handout - Plan: doxycycline (VIBRA-TABS) 100 MG tablet, predniSONE (DELTASONE) 10 MG tablet, Ambulatory referral to ENT  Cough -Also likely component of COPD contributing to chest congestion symptoms. -Reviewed CXR from 01/11/15, mention of probable emphysema. -Doxycycline and prednisone. -Rx for Tessalon Perles sent to pharmacy given patient's prior history of retinal detachment status post surgery.  Nicotine dependence -Discussed smoking cessation > 3 min, <10  min. -pt has been trying to get chantix, but having difficulty with his insurance company. -Suggested cutting down -We will reassess at each visit  F/u prn  Grier Mitts, MD

## 2017-10-14 ENCOUNTER — Ambulatory Visit: Payer: Medicare HMO | Admitting: Family Medicine

## 2017-10-29 DIAGNOSIS — T7840XA Allergy, unspecified, initial encounter: Secondary | ICD-10-CM | POA: Insufficient documentation

## 2017-10-29 DIAGNOSIS — J0101 Acute recurrent maxillary sinusitis: Secondary | ICD-10-CM | POA: Diagnosis not present

## 2017-10-29 DIAGNOSIS — J329 Chronic sinusitis, unspecified: Secondary | ICD-10-CM | POA: Insufficient documentation

## 2017-11-04 DIAGNOSIS — J329 Chronic sinusitis, unspecified: Secondary | ICD-10-CM | POA: Diagnosis not present

## 2017-11-04 DIAGNOSIS — J0191 Acute recurrent sinusitis, unspecified: Secondary | ICD-10-CM | POA: Diagnosis not present

## 2017-12-12 ENCOUNTER — Other Ambulatory Visit: Payer: Self-pay | Admitting: Cardiology

## 2017-12-13 NOTE — Telephone Encounter (Signed)
Rx(s) sent to pharmacy electronically.  

## 2017-12-17 ENCOUNTER — Encounter: Payer: Self-pay | Admitting: Family Medicine

## 2017-12-17 ENCOUNTER — Ambulatory Visit (HOSPITAL_COMMUNITY)
Admission: RE | Admit: 2017-12-17 | Discharge: 2017-12-17 | Disposition: A | Discharge status: Home / Self Care | Payer: Medicare HMO | Source: Ambulatory Visit | Attending: Cardiovascular Disease | Admitting: Cardiovascular Disease

## 2017-12-17 ENCOUNTER — Ambulatory Visit (INDEPENDENT_AMBULATORY_CARE_PROVIDER_SITE_OTHER): Payer: Medicare HMO | Admitting: Family Medicine

## 2017-12-17 VITALS — BP 140/90 | HR 52 | Temp 97.9°F | Wt 230.3 lb

## 2017-12-17 DIAGNOSIS — M7061 Trochanteric bursitis, right hip: Secondary | ICD-10-CM | POA: Diagnosis not present

## 2017-12-17 DIAGNOSIS — R600 Localized edema: Secondary | ICD-10-CM | POA: Diagnosis not present

## 2017-12-17 DIAGNOSIS — R2242 Localized swelling, mass and lump, left lower limb: Secondary | ICD-10-CM

## 2017-12-17 MED ORDER — METHYLPREDNISOLONE ACETATE 40 MG/ML IJ SUSP
40.0000 mg | Freq: Once | INTRAMUSCULAR | Status: AC
  Administered 2017-12-17: 40 mg via INTRA_ARTICULAR

## 2017-12-17 NOTE — Patient Instructions (Signed)
Trochanteric Bursitis Trochanteric bursitis is a condition that causes hip pain. Trochanteric bursitis happens when fluid-filled sacs (bursae) in the hip get irritated. Normally these sacs absorb shock and help strong bands of tissue (tendons) in your hip glide smoothly over each other and over your hip bones. What are the causes? This condition results from increased friction between the hip bones and the tendons that go over them. This condition can happen if you:  Have weak hips.  Use your hip muscles too much (overuse).  Get hit in the hip.  What increases the risk? This condition is more likely to develop in:  Women.  Adults who are middle-aged or older.  People with arthritis or a spinal condition.  People with weak buttocks muscles (gluteal muscles).  People who have one leg that is shorter than the other.  People who participate in certain kinds of athletic activities, such as: ? Running sports, especially long-distance running. ? Contact sports, like football or martial arts. ? Sports in which falls may occur, like skiing.  What are the signs or symptoms? The main symptom of this condition is pain and tenderness over the point of your hip. The pain may be:  Sharp and intense.  Dull and achy.  Felt on the outside of your thigh.  It may increase when you:  Lie on your side.  Walk or run.  Go up on stairs.  Sit.  Stand up after sitting.  Stand for long periods of time.  How is this diagnosed? This condition may be diagnosed based on:  Your symptoms.  Your medical history.  A physical exam.  Imaging tests, such as: ? X-rays to check your bones. ? An MRI or ultrasound to check your tendons and muscles.  During your physical exam, your health care provider will check the movement and strength of your hip. He or she may press on the point of your hip to check for pain. How is this treated? This condition may be treated by:  Resting.  Reducing  your activity.  Avoiding activities that cause pain.  Using crutches, a cane, or a walker to decrease the strain on your hip.  Taking medicine to help with swelling.  Having medicine injected into the bursae to help with swelling.  Using ice, heat, and massage therapy for pain relief.  Physical therapy exercises for strength and flexibility.  Surgery (rare).  Follow these instructions at home: Activity  Rest.  Avoid activities that cause pain.  Return to your normal activities as told by your health care provider. Ask your health care provider what activities are safe for you. Managing pain, stiffness, and swelling  Take over-the-counter and prescription medicines only as told by your health care provider.  If directed, apply heat to the injured area as told by your health care provider. ? Place a towel between your skin and the heat source. ? Leave the heat on for 20-30 minutes. ? Remove the heat if your skin turns bright red. This is especially important if you are unable to feel pain, heat, or cold. You may have a greater risk of getting burned.  If directed, apply ice to the injured area: ? Put ice in a plastic bag. ? Place a towel between your skin and the bag. ? Leave the ice on for 20 minutes, 2-3 times a day. General instructions  If the affected leg is one that you use for driving, ask your health care provider when it is safe to drive.    Use crutches, a cane, or a walker as told by your health care provider.  If one of your legs is shorter than the other, get fitted for a shoe insert.  Lose weight if you are overweight. How is this prevented?  Wear supportive footwear that is appropriate for your sport.  If you have hip pain, start any new exercise or sport slowly.  Maintain physical fitness, including: ? Strength. ? Flexibility. Contact a health care provider if:  Your pain does not improve with 2-4 weeks. Get help right away if:  You develop  severe pain.  You have a fever.  You develop increased redness over your hip.  You have a change in your bowel function or bladder function.  You cannot control the muscles in your feet. This information is not intended to replace advice given to you by your health care provider. Make sure you discuss any questions you have with your health care provider. Document Released: 08/30/2004 Document Revised: 03/28/2016 Document Reviewed: 07/08/2015 Elsevier Interactive Patient Education  2018 [employer].  We are setting up venous doppler of leg. Go immediately for ER for any chest pain or sudden shortness of breath.

## 2017-12-17 NOTE — Progress Notes (Signed)
Subjective:     Patient ID: Cory [name], male   DOB: 1946/04/13, 72 y.o.   MRN: [id number]  HPI Patient seen for the following issues  New problem of left lower extremity swelling mostly foot and lower leg for the past week. Denies any injury. No associated pain. He denies any chest pain or shortness of breath. No history of DVT. No family history of DVT. Does still smoke about one half pack cigarettes per day. Edema is fairly continuous throughout the day. No pain with ambulation.  No claudication symptoms.  Chronic problems include history of aortic stenosis and atrial fibrillation.  Second problem is right lateral hip pain. He had flareup previously and had steroid injection for bursitis which helped tremendously. His recent pains are worse at night. Denies any low back pain. No medial or anterior hip pain. Denies lower extremity numbness or weakness.  Past Medical History:  Diagnosis Date  . Aortic stenosis    a. Mild by echo 04/2015.  Marland Kitchen PAF (paroxysmal atrial fibrillation) ([place])   . Tobacco abuse    Past Surgical History:  Procedure Laterality Date  . CERVICAL SPINE SURGERY    . LEG SURGERY     Trauma  . RETINAL DETACHMENT SURGERY      reports that he has been smoking cigarettes.  He has a 50.00 pack-year smoking history. He has never used smokeless tobacco. He reports that he drinks alcohol. He reports that he does not use drugs. family history includes Alcoholism in his father; Breast cancer in his sister; Ovarian cancer in his mother and sister. Allergies  Allergen Reactions  . Azithromycin Diarrhea  . Mucinex [Guaifenesin Er]     TAKES MUCINEX WITH NO PROBLEMS      Review of Systems  Constitutional: Negative for chills, fever and unexpected weight change.  Respiratory: Negative for cough and shortness of breath.   Cardiovascular: Positive for leg swelling. Negative for chest pain.  Musculoskeletal: Negative for back pain.  Neurological: Negative for weakness and  numbness.  Hematological: Negative for adenopathy.       Objective:   Physical Exam  Constitutional: He is oriented to person, place, and time. He appears well-developed and well-nourished.  Cardiovascular: Normal rate.  Pulmonary/Chest: Effort normal and breath sounds normal.  Musculoskeletal: He exhibits edema.  Full range of motion right hip. He has some tenderness over the greater trochanteric bursa on the right side. No right leg edema.  He has some mild edema involving left foot ankle and leg compared to the right side. Capillary refill slightly slower on the left compared to the right but both feet are warm to touch.  Neurological: He is alert and oriented to person, place, and time.       Assessment:     #1 asymmetric left lower extremity edema compared to the right. Rule out acute DVT  #2 right lateral hip pain. Suspect trochanteric bursitis    Plan:     -Setup venous Doppler left lower extremity to further assess. This was scheduled for 2:30 PM today -Go to ER immediately for any chest pain or sudden dyspnea -Patient requesting steroid injection right lateral hip which helped previously was bursa pain. We discussed risks and benefits include risk of bruising, infection and patient consented. Prepped right lateral hip region over the bursa with Betadine. Using 25-gauge 1 inch needle injected 40 mg of Depo-Medrol and 2 mL of plain Xylocaine and patient tolerated well  [name] MD [facility]

## 2017-12-17 NOTE — Addendum Note (Signed)
Addended by: Westley Hummer B on: 12/17/2017 12:15 PM   Modules accepted: Orders

## 2018-03-03 DIAGNOSIS — H33001 Unspecified retinal detachment with retinal break, right eye: Secondary | ICD-10-CM | POA: Diagnosis not present

## 2018-03-03 DIAGNOSIS — H5213 Myopia, bilateral: Secondary | ICD-10-CM | POA: Diagnosis not present

## 2018-03-03 DIAGNOSIS — H2512 Age-related nuclear cataract, left eye: Secondary | ICD-10-CM | POA: Diagnosis not present

## 2018-03-17 ENCOUNTER — Other Ambulatory Visit: Payer: Self-pay | Admitting: Cardiology

## 2018-03-20 ENCOUNTER — Other Ambulatory Visit: Payer: Self-pay | Admitting: Family Medicine

## 2018-04-29 ENCOUNTER — Ambulatory Visit (INDEPENDENT_AMBULATORY_CARE_PROVIDER_SITE_OTHER): Payer: Medicare HMO | Admitting: Family Medicine

## 2018-04-29 ENCOUNTER — Encounter: Payer: Self-pay | Admitting: Family Medicine

## 2018-04-29 ENCOUNTER — Ambulatory Visit (INDEPENDENT_AMBULATORY_CARE_PROVIDER_SITE_OTHER): Payer: Medicare HMO

## 2018-04-29 ENCOUNTER — Other Ambulatory Visit: Payer: Self-pay

## 2018-04-29 VITALS — BP 136/94 | HR 60 | Temp 98.3°F | Ht 77.0 in | Wt 231.0 lb

## 2018-04-29 DIAGNOSIS — Z23 Encounter for immunization: Secondary | ICD-10-CM

## 2018-04-29 DIAGNOSIS — E785 Hyperlipidemia, unspecified: Secondary | ICD-10-CM

## 2018-04-29 DIAGNOSIS — M25551 Pain in right hip: Secondary | ICD-10-CM | POA: Diagnosis not present

## 2018-04-29 DIAGNOSIS — M1611 Unilateral primary osteoarthritis, right hip: Secondary | ICD-10-CM | POA: Diagnosis not present

## 2018-04-29 LAB — LIPID PANEL
Cholesterol: 160 mg/dL (ref 0–200)
HDL: 43.6 mg/dL (ref >39.00)
LDL Cholesterol: 79 mg/dL (ref 0–99)
NONHDL: 116.47
Total CHOL/HDL Ratio: 4
Triglycerides: 189 mg/dL — ABNORMAL HIGH (ref 0.0–149.0)
VLDL: 37.8 mg/dL (ref 0.0–40.0)

## 2018-04-29 NOTE — Progress Notes (Signed)
  Subjective:     Patient ID: Cory [name], male   DOB: [birth year], 72 y.o.   MRN: [id number]  HPI Patient seen for the following issues  Progressive right hip pain for several years.  Pain especially worse at night and after prolonged periods of sitting.  He has some stiffness.  Pain is mostly anterior and medial hip.  No recent injury.  Denies left hip pain.  Pain starting to be more severe at night.  Minimal relief with over-the-counter medications. No low back pain.  Patient requesting lipid panel.  History of mild hyperlipidemia in the past.  Only current medication is low-dose metoprolol. Ongoing nicotine use.  No recent chest pains Past history of transient atrial fibrillation.  No recent palpitations.  Past Medical History:  Diagnosis Date  . Aortic stenosis    a. Mild by echo 04/2015.  Marland Kitchen PAF (paroxysmal atrial fibrillation) ([place])   . Tobacco abuse    Past Surgical History:  Procedure Laterality Date  . CERVICAL SPINE SURGERY    . LEG SURGERY     Trauma  . RETINAL DETACHMENT SURGERY      reports that he has been smoking cigarettes. He has a 50.00 pack-year smoking history. He has never used smokeless tobacco. He reports that he drinks alcohol. He reports that he does not use drugs. family history includes Alcoholism in his father; Breast cancer in his sister; Ovarian cancer in his mother and sister. Allergies  Allergen Reactions  . Azithromycin Diarrhea  . Mucinex [Guaifenesin Er]     TAKES MUCINEX WITH NO PROBLEMS      Review of Systems  Constitutional: Negative for appetite change, fatigue and unexpected weight change.  Eyes: Negative for visual disturbance.  Respiratory: Negative for cough, chest tightness and shortness of breath.   Cardiovascular: Negative for chest pain, palpitations and leg swelling.  Gastrointestinal: Negative for abdominal pain.  Musculoskeletal: Positive for arthralgias. Negative for back pain.  Neurological: Negative for dizziness,  syncope, weakness, light-headedness and headaches.       Objective:   Physical Exam  Constitutional: He is oriented to person, place, and time. He appears well-developed and well-nourished.  HENT:  Right Ear: External ear normal.  Left Ear: External ear normal.  Mouth/Throat: Oropharynx is clear and moist.  Eyes: Pupils are equal, round, and reactive to light.  Neck: Neck supple. No thyromegaly present.  Cardiovascular: Normal rate and regular rhythm.  Pulmonary/Chest: Effort normal and breath sounds normal. No respiratory distress. He has no wheezes. He has no rales.  Musculoskeletal: He exhibits no edema.  Patient has somewhat restricted range of motion right hip with internal and external rotation and pain with rotation in either direction.  No leg edema  Neurological: He is alert and oriented to person, place, and time.       Assessment:     #1 several year history of progressive right hip pain.  Suspect primary osteoarthritis  #2.  History of mild hyperlipidemia-requesting repeat lipids    Plan:     -X-ray of right hip- does show some degenerative changes - but not severe and very little change comparing right to left. -Check fasting lipids -Flu vaccine given -consider sports medicine referral -have recommended he set up CPE.  [name] MD  Primary Care at [facility]

## 2018-05-05 NOTE — Progress Notes (Signed)
[facility] [address] [place], [place] [address] Phone: [phone] Subjective:    I'm seeing this patient by the request  of:  [name], MD   CC: Right hip pain  HCW:[id number]  Cory [name] is a 72 y.o. male coming in with complaint of bilateral hip pain, right worse than left. Pain is over the greater trochanter and radiates into the right glute. Walking a lot or standing in one spot increases his pain. Does bother him at night. Has used ice and IBU. Pain is low he states on pain scale. Dull in nature but can be sharp. Pain can radiate down to his knee on the right side.    Patient did have x-rays of the hip done.  Was independently visualized by me.  Moderate degenerative changes noted of the hip bilaterally  Past Medical History:  Diagnosis Date  . Aortic stenosis    a. Mild by echo 04/2015.  Marland Kitchen PAF (paroxysmal atrial fibrillation) ([place])   . Tobacco abuse    Past Surgical History:  Procedure Laterality Date  . CERVICAL SPINE SURGERY    . LEG SURGERY     Trauma  . RETINAL DETACHMENT SURGERY     Social History   Socioeconomic History  . Marital status: Married    Spouse name: Not on file  . Number of children: 5  . Years of education: Not on file  . Highest education level: Not on file  Occupational History  . Not on file  Social Needs  . Financial resource strain: Not on file  . Food insecurity:    Worry: Not on file    Inability: Not on file  . Transportation needs:    Medical: Not on file    Non-medical: Not on file  Tobacco Use  . Smoking status: Current Every Day Smoker    Packs/day: 1.00    Years: 50.00    Pack years: 50.00    Types: Cigarettes  . Smokeless tobacco: Never Used  Substance and Sexual Activity  . Alcohol use: Yes    Alcohol/week: 0.0 standard drinks    Comment: 4 to 5 times a week; beer, wine and liquor   . Drug use: No  . Sexual activity: Not on file  Lifestyle  . Physical activity:   Days per week: Not on file    Minutes per session: Not on file  . Stress: Not on file  Relationships  . Social connections:    Talks on phone: Not on file    Gets together: Not on file    Attends religious service: Not on file    Active member of club or organization: Not on file    Attends meetings of clubs or organizations: Not on file    Relationship status: Not on file  Other Topics Concern  . Not on file  Social History Narrative   Scores reading tests.  Lives with wife.     Allergies  Allergen Reactions  . Azithromycin Diarrhea  . Mucinex [Guaifenesin Er]     TAKES MUCINEX WITH NO PROBLEMS    Family History  Problem Relation Age of Onset  . Ovarian cancer Mother   . Alcoholism Father   . Breast cancer Sister   . Ovarian cancer Sister      Current Outpatient Medications (Cardiovascular):  .  metoprolol succinate (TOPROL-XL) 25 MG 24 hr tablet, TAKE 1 TABLET BY MOUTH ONCE DAILY (PLEASE  MAKE  APPOINTMENT  FOR  REFILLS)  Current Outpatient Medications (Respiratory):  .  cetirizine (ZYRTEC) 10 MG tablet, Take 1 tablet (10 mg total) by mouth daily. .  fluticasone (FLONASE) 50 MCG/ACT nasal spray, Place 2 sprays into both nostrils daily.       Past medical history, social, surgical and family history all reviewed in electronic medical record.  No pertanent information unless stated regarding to the chief complaint.   Review of Systems:  No headache, visual changes, nausea, vomiting, diarrhea, constipation, dizziness, abdominal pain, skin rash, fevers, chills, night sweats, weight loss, swollen lymph nodes, body aches, joint swelling, muscle aches, chest pain, shortness of breath, mood changes.   Objective  Blood pressure (!) 132/98, pulse 64, height 6\' 5"  (1.956 m), weight 231 lb (104.8 kg), SpO2 96 %.   General: No apparent distress alert and oriented x3 mood and affect normal, dressed appropriately.  HEENT: Pupils equal, extraocular movements intact    Respiratory: Patient's speak in full sentences and does not appear short of breath  Cardiovascular: No lower extremity edema, non tender, no erythema  Skin: Warm dry intact with no signs of infection or rash on extremities or on axial skeleton.  Abdomen: Soft nontender  Neuro: Cranial nerves II through XII are intact, neurovascularly intact in all extremities with 2+ DTRs and 2+ pulses.  Lymph: No lymphadenopathy of posterior or anterior cervical chain or axillae bilaterally.  Gait normal with good balance and coordination.  MSK:  Non tender with full range of motion and good stability and symmetric strength and tone of shoulders, elbows, wrist, , knee and ankles bilaterally.  Mild arthritic changes of multiple joints   Back exam does show loss of lordosis.  Patient has some very minimal tenderness to palpation of the paraspinal musculature at L5-S1.  Severe tightness of the piriformis bilaterally with a positive Corky Sox on the right side.  Severe tenderness over the piriformis on the right side.  Involuntary guarding noted.  Negative straight leg test.  [id number]; 15 additional minutes spent for Therapeutic exercises as stated in above notes.  This included exercises focusing on stretching, strengthening, with significant focus on eccentric aspects.   Long term goals include an improvement in range of motion, strength, endurance as well as avoiding reinjury. Patient's frequency would include in 1-2 times a day, 3-5 times a week for a duration of 6-12 weeks. Low back exercises that included:  Pelvic tilt/bracing instruction to focus on control of the pelvic girdle and lower abdominal muscles  Glute strengthening exercises, focusing on proper firing of the glutes without engaging the low back muscles Proper stretching techniques for maximum relief for the hamstrings, hip flexors, low back and some rotation where tolerated   Proper technique shown and discussed handout in great detail with ATC.  All  questions were discussed and answered.      Impression and Recommendations:     This case required medical decision making of moderate complexity. The above documentation has been reviewed and is accurate and complete Lyndal Pulley, DO       Note: This dictation was prepared with Dragon dictation along with smaller phrase technology. Any transcriptional errors that result from this process are unintentional.

## 2018-05-06 ENCOUNTER — Ambulatory Visit: Payer: Medicare HMO | Admitting: Family Medicine

## 2018-05-06 ENCOUNTER — Encounter: Payer: Self-pay | Admitting: Family Medicine

## 2018-05-06 DIAGNOSIS — G5701 Lesion of sciatic nerve, right lower limb: Secondary | ICD-10-CM

## 2018-05-06 NOTE — Assessment & Plan Note (Signed)
Piriformis syndrome on the right side.  Discussed icing regimen and home exercise.  Discussed which activities of doing which wants to avoid.  Patient will do more manual massage.  Icing.  Patient declined medications.  Follow-up again in 4 weeks

## 2018-05-06 NOTE — Patient Instructions (Addendum)
Good to see you  Piriformis syndrome Ice 20 minutes 2 times daily. Usually after activity and before bed. Exercises 3 times a week.  Over the counter get  Vitamin D 2000 IU daily  Tart cherry extract any dose at night  See me again in 4-6 weeks

## 2018-05-15 NOTE — Progress Notes (Signed)
Cardiology Office Note   Date:  05/16/2018   ID:  [name], DOB [birth year], MRN [id number]  PCP:  [name], MD  Cardiologist:   [name], MD    Chief Complaint  Patient presents with  . Atrial Fibrillation      History of Present Illness: Cory [name] is a 72 y.o. male who presents for evaluation of PAF.  He is done well since I last saw him.  He does rarely have palpitations lasting for a few seconds and he thinks is probably his atrial fibrillation.  He stays active.  He is active in his yard and does a little tennis and does some walking. The patient denies any new symptoms such as chest discomfort, neck or arm discomfort. There has been no new shortness of breath, PND or orthopnea. There have been no reported palpitations, presyncope or syncope.   Past Medical History:  Diagnosis Date  . Aortic stenosis    a. Mild by echo 04/2015.  Marland Kitchen PAF (paroxysmal atrial fibrillation) ([place])   . Tobacco abuse     Past Surgical History:  Procedure Laterality Date  . CERVICAL SPINE SURGERY    . LEG SURGERY     Trauma  . RETINAL DETACHMENT SURGERY       Current Outpatient Medications  Medication Sig Dispense Refill  . cetirizine (ZYRTEC) 10 MG tablet Take 1 tablet (10 mg total) by mouth daily. 30 tablet 11  . fluticasone (FLONASE) 50 MCG/ACT nasal spray Place 2 sprays into both nostrils daily. 16 g 6  . diltiazem (CARDIZEM CD) 120 MG 24 hr capsule Take 1 capsule (120 mg total) by mouth daily. 90 capsule 3  . rivaroxaban (XARELTO) 20 MG TABS tablet Take 1 tablet (20 mg total) by mouth daily with supper. 30 tablet 11   No current facility-administered medications for this visit.     Allergies:   Azithromycin and Mucinex [guaifenesin er]    ROS:  Please see the history of present illness.   Otherwise, review of systems are positive for none.   All other systems are reviewed and negative.    PHYSICAL EXAM: VS:  BP 136/74   Pulse 73   Ht 6\' 5"  (1.956 m)    Wt 231 lb (104.8 kg)   SpO2 94%   BMI 27.39 kg/m  , BMI Body mass index is 27.39 kg/m.  GENERAL:  Well appearing NECK:  No jugular venous distention, waveform within normal limits, carotid upstroke brisk and symmetric, no bruits, no thyromegaly LUNGS:  Clear to auscultation bilaterally CHEST:  Unremarkable HEART:  PMI not displaced or sustained,S1 and S2 within normal limits, no S3, no S4, no clicks, no rubs, 2 out of 6 apical systolic murmur radiating slightly at the aortic outflow tract, no diastolic murmurs ABD:  Flat, positive bowel sounds normal in frequency in pitch, no bruits, no rebound, no guarding, no midline pulsatile mass, no hepatomegaly, no splenomegaly EXT:  2 plus pulses throughout, no edema, no cyanosis no clubbing   EKG:  EKG is  ordered today. Sinus bradycardia, rate 73, axis within normal limits, intervals within normal limits, no acute ST-T wave changes.  Recent Labs: No results found for requested labs within last 8760 hours.    Lipid Panel    Component Value Date/Time   CHOL 160 04/29/2018 1133   TRIG 189.0 (H) 04/29/2018 1133   HDL 43.60 04/29/2018 1133   CHOLHDL 4 04/29/2018 1133   VLDL 37.8 04/29/2018 1133  [place] 79 04/29/2018 1133      Wt Readings from Last 3 Encounters:  05/16/18 231 lb (104.8 kg)  05/06/18 231 lb (104.8 kg)  04/29/18 231 lb (104.8 kg)      Other studies Reviewed: Additional studies/ records that were reviewed today include:   None Review of the above records demonstrates:  None   ASSESSMENT AND PLAN:  ATRIAL FIB:      He's having very few paroxysms. [name] has a CHA2DS2 - VASc score of 3 almost.  I am going to see that he has hypertension which moves his score above 2 and next year it will be 3 as he increases his age.  Its time to start blood thinner I had a long conversation with him about this.  He has no contraindications to Xarelto.  I will check a CBC and basic metabolic profile.  He will start this  medication.   TOBACCO: We talked again about this.  He is cutting down.  He does not want to take Chantix.  HTN:    He is going to stop his metoprolol.  I am going to start Cardizem 120 mg daily.  This could control any atrial fibrillation or palpitations plus control his blood pressure.  I did review his blood pressure diary and he is typically above the 140s and in the 881 range systolic in the mornings.   AS: This was again mild by exam.  No change in therapy.  No further imaging.   Current medicines are reviewed at length with the patient today.  The patient does not have concerns regarding medicines.  The following changes have been made:  As above  Labs/ tests ordered today include:    Orders Placed This Encounter  Procedures  . CBC  . Basic Metabolic Panel (BMET)  . EKG 12-Lead     Disposition:   FU with me in six months.      Signed, [name], MD  05/16/2018 2:34 PM    [place]

## 2018-05-16 ENCOUNTER — Ambulatory Visit: Payer: Medicare HMO | Admitting: Cardiology

## 2018-05-16 ENCOUNTER — Encounter: Payer: Self-pay | Admitting: Cardiology

## 2018-05-16 VITALS — BP 136/74 | HR 73 | Ht 77.0 in | Wt 231.0 lb

## 2018-05-16 DIAGNOSIS — I4891 Unspecified atrial fibrillation: Secondary | ICD-10-CM | POA: Diagnosis not present

## 2018-05-16 DIAGNOSIS — Z79899 Other long term (current) drug therapy: Secondary | ICD-10-CM | POA: Diagnosis not present

## 2018-05-16 DIAGNOSIS — I10 Essential (primary) hypertension: Secondary | ICD-10-CM | POA: Diagnosis not present

## 2018-05-16 DIAGNOSIS — I350 Nonrheumatic aortic (valve) stenosis: Secondary | ICD-10-CM

## 2018-05-16 MED ORDER — DILTIAZEM HCL ER COATED BEADS 120 MG PO CP24: 120.0000 mg | ORAL_CAPSULE | Freq: Every day | ORAL | 3 refills | Status: DC

## 2018-05-16 MED ORDER — RIVAROXABAN 20 MG PO TABS: 20.0000 mg | ORAL_TABLET | Freq: Every day | ORAL | 11 refills | Status: DC

## 2018-05-16 NOTE — Patient Instructions (Signed)
Medication Instructions:  START- Xarelto 20 mg daily STOP- Metoprolol START- Cardizem 120 mg daily  If you need a refill on your cardiac medications before your next appointment, please call your pharmacy.  Labwork: CBC and BMP Today HERE IN OUR OFFICE AT [facility]  Take the provided lab slips with you to the lab for your blood draw.   You will NOT need to fast   If you have labs (blood work) drawn today and your tests are completely normal, you will receive your results only by: Marland Kitchen MyChart Message (if you have MyChart) OR . A paper copy in the mail If you have any lab test that is abnormal or we need to change your treatment, we will call you to review the results.  Testing/Procedures: None Ordered  Follow-Up: You will need a follow up appointment in 6 Months.  Please call our office 2 months in advance([phone]) to schedule the appointment.  You may see  DR Percival Spanish or one of the following Advanced Practice Providers on your designated Care Team:   . Rosaria Ferries, PA-C . Jory Sims, DNP, ANP  At [facility], you and your health needs are our priority.  As part of our continuing mission to provide you with exceptional heart care, we have created designated Provider Care Teams.  These Care Teams include your primary Cardiologist (physician) and Advanced Practice Providers (APPs -  Physician Assistants and Nurse Practitioners) who all work together to provide you with the care you need, when you need it.   Thank you for choosing [facility] at [facility]!!

## 2018-05-17 LAB — BASIC METABOLIC PANEL
BUN / CREAT RATIO: 14 (ref 10–24)
BUN: 13 mg/dL (ref 8–27)
CO2: 24 mmol/L (ref 20–29)
CREATININE: 0.92 mg/dL (ref 0.76–1.27)
Calcium: 9.3 mg/dL (ref 8.6–10.2)
Chloride: 104 mmol/L (ref 96–106)
GFR calc non Af Amer: 82 mL/min/{1.73_m2} (ref >59)
GFR, EST AFRICAN AMERICAN: 94 mL/min/{1.73_m2} (ref >59)
Glucose: 111 mg/dL — ABNORMAL HIGH (ref 65–99)
Potassium: 3.8 mmol/L (ref 3.5–5.2)
Sodium: 141 mmol/L (ref 134–144)

## 2018-05-17 LAB — CBC
Hematocrit: 44.8 % (ref 37.5–51.0)
Hemoglobin: 15.7 g/dL (ref 13.0–17.7)
MCH: 32.6 pg (ref 26.6–33.0)
MCHC: 35 g/dL (ref 31.5–35.7)
MCV: 93 fL (ref 79–97)
RBC: 4.82 x10E6/uL (ref 4.14–5.80)
RDW: 12 % — ABNORMAL LOW (ref 12.3–15.4)
WBC: 6.6 10*3/uL (ref 3.4–10.8)

## 2018-06-06 ENCOUNTER — Ambulatory Visit: Payer: Medicare HMO

## 2018-06-09 NOTE — Progress Notes (Signed)
[name] [place], [place] [address] Phone: [phone] Subjective:    I Cory [name] am serving as a Education administrator for Dr. Hulan Saas.   CC: Right hip pain follow-up  YQM:[id number]  [name] is a 72 y.o. male coming in with complaint of hip pain. States his hip is doing better. Patient was found to have piriformis syndrome.  States that he is 50 to 60% better.  Not as much severe spasm.  Feels that the exercises have been helpful.  Denies any new symptoms.     Past Medical History:  Diagnosis Date  . Aortic stenosis    a. Mild by echo 04/2015.  Marland Kitchen PAF (paroxysmal atrial fibrillation) ([place])   . Tobacco abuse    Past Surgical History:  Procedure Laterality Date  . CERVICAL SPINE SURGERY    . LEG SURGERY     Trauma  . RETINAL DETACHMENT SURGERY     Social History   Socioeconomic History  . Marital status: Married    Spouse name: Not on file  . Number of children: 5  . Years of education: Not on file  . Highest education level: Not on file  Occupational History  . Not on file  Social Needs  . Financial resource strain: Not on file  . Food insecurity:    Worry: Not on file    Inability: Not on file  . Transportation needs:    Medical: Not on file    Non-medical: Not on file  Tobacco Use  . Smoking status: Current Every Day Smoker    Packs/day: 1.00    Years: 50.00    Pack years: 50.00    Types: Cigarettes  . Smokeless tobacco: Never Used  Substance and Sexual Activity  . Alcohol use: Yes    Alcohol/week: 0.0 standard drinks    Comment: 4 to 5 times a week; beer, wine and liquor   . Drug use: No  . Sexual activity: Not on file  Lifestyle  . Physical activity:    Days per week: Not on file    Minutes per session: Not on file  . Stress: Not on file  Relationships  . Social connections:    Talks on phone: Not on file    Gets together: Not on file    Attends religious service: Not on file    Active member of  club or organization: Not on file    Attends meetings of clubs or organizations: Not on file    Relationship status: Not on file  Other Topics Concern  . Not on file  Social History Narrative   Scores reading tests.  Lives with wife.     Allergies  Allergen Reactions  . Azithromycin Diarrhea  . Mucinex [Guaifenesin Er]     TAKES MUCINEX WITH NO PROBLEMS    Family History  Problem Relation Age of Onset  . Ovarian cancer Mother   . Alcoholism Father   . Breast cancer Sister   . Ovarian cancer Sister      Current Outpatient Medications (Cardiovascular):  .  diltiazem (CARDIZEM CD) 120 MG 24 hr capsule, Take 1 capsule (120 mg total) by mouth daily.  Current Outpatient Medications (Respiratory):  .  cetirizine (ZYRTEC) 10 MG tablet, Take 1 tablet (10 mg total) by mouth daily. .  fluticasone (FLONASE) 50 MCG/ACT nasal spray, Place 2 sprays into both nostrils daily.   Current Outpatient Medications (Hematological):  .  rivaroxaban (XARELTO) 20 MG  TABS tablet, Take 1 tablet (20 mg total) by mouth daily with supper.     Past medical history, social, surgical and family history all reviewed in electronic medical record.  No pertanent information unless stated regarding to the chief complaint.   Review of Systems:  No headache, visual changes, nausea, vomiting, diarrhea, constipation, dizziness, abdominal pain, skin rash, fevers, chills, night sweats, weight loss, swollen lymph nodes, body aches, joint swelling,  chest pain, shortness of breath, mood changes.  Positive muscle aches  Objective  Blood pressure 140/82, pulse 70, height 6\' 5"  (1.956 m), weight 230 lb (104.3 kg), SpO2 96 %.   General: No apparent distress alert and oriented x3 mood and affect normal, dressed appropriately.  HEENT: Pupils equal, extraocular movements intact  Respiratory: Patient's speak in full sentences and does not appear short of breath  Cardiovascular: No lower extremity edema, non tender, no  erythema  Skin: Warm dry intact with no signs of infection or rash on extremities or on axial skeleton.  Abdomen: Soft nontender  Neuro: Cranial nerves II through XII are intact, neurovascularly intact in all extremities with 2+ DTRs and 2+ pulses.  Lymph: No lymphadenopathy of posterior or anterior cervical chain or axillae bilaterally.  Gait normal with good balance and coordination.  MSK: Mild tender with full range of motion and good stability and symmetric strength and tone of shoulders, elbows, wrist, , knee and ankles bilaterally.  Mild arthritic changes of multiple joints Right hip exam shows the patient still moderately tender to palpation in the piriformis area.  Significant tightness with Corky Sox but less radicular symptoms.  Patient has tightness of the hamstring but negative straight leg test.  Minimal discomfort in the paraspinal musculature lumbar spine.  Gait is fairly unremarkable.   Impression and Recommendations:      The above documentation has been reviewed and is accurate and complete Cory Pulley, DO       Note: This dictation was prepared with Dragon dictation along with smaller phrase technology. Any transcriptional errors that result from this process are unintentional.

## 2018-06-10 ENCOUNTER — Encounter: Payer: Self-pay | Admitting: Family Medicine

## 2018-06-10 ENCOUNTER — Ambulatory Visit: Payer: Medicare HMO | Admitting: Family Medicine

## 2018-06-10 DIAGNOSIS — G5701 Lesion of sciatic nerve, right lower limb: Secondary | ICD-10-CM | POA: Diagnosis not present

## 2018-06-10 NOTE — Patient Instructions (Signed)
You are doing great  Keep it up  Stay active Continue the exercises and the vitamins See me again in 6-8 weeks if not perfect

## 2018-06-10 NOTE — Assessment & Plan Note (Signed)
Piriformis syndrome is improving.  We discussed different things such as formal physical therapy, patient wants to continue the conservative therapy at this time and follow-up with me again in 6 to 8 weeks.

## 2018-07-23 ENCOUNTER — Ambulatory Visit: Payer: Medicare HMO | Admitting: Family Medicine

## 2018-11-21 ENCOUNTER — Telehealth: Payer: Self-pay | Admitting: Cardiology

## 2018-11-21 NOTE — Telephone Encounter (Signed)
lmsg for patient to call back.  He can schedule appt (6 month recall) as virtual next week or at his convenience or an office visit in August./dc/04.17.2020

## 2018-11-24 ENCOUNTER — Telehealth: Payer: Self-pay | Admitting: *Deleted

## 2018-11-24 NOTE — Telephone Encounter (Signed)
Message left to schedule awv

## 2018-12-01 ENCOUNTER — Telehealth: Payer: Self-pay | Admitting: *Deleted

## 2018-12-01 NOTE — Telephone Encounter (Signed)
12/01/18 LMOM @ 0856 am, re: appointment.

## 2018-12-12 ENCOUNTER — Telehealth: Payer: Self-pay | Admitting: Cardiology

## 2018-12-12 NOTE — Telephone Encounter (Signed)
LVM to call and schedule recall appt.

## 2019-02-25 DIAGNOSIS — L814 Other melanin hyperpigmentation: Secondary | ICD-10-CM | POA: Diagnosis not present

## 2019-02-25 DIAGNOSIS — L821 Other seborrheic keratosis: Secondary | ICD-10-CM | POA: Diagnosis not present

## 2019-02-25 DIAGNOSIS — C4441 Basal cell carcinoma of skin of scalp and neck: Secondary | ICD-10-CM | POA: Diagnosis not present

## 2019-02-25 DIAGNOSIS — D485 Neoplasm of uncertain behavior of skin: Secondary | ICD-10-CM | POA: Diagnosis not present

## 2019-02-25 DIAGNOSIS — Z85828 Personal history of other malignant neoplasm of skin: Secondary | ICD-10-CM | POA: Diagnosis not present

## 2019-02-25 DIAGNOSIS — D229 Melanocytic nevi, unspecified: Secondary | ICD-10-CM | POA: Diagnosis not present

## 2019-02-25 DIAGNOSIS — D1801 Hemangioma of skin and subcutaneous tissue: Secondary | ICD-10-CM | POA: Diagnosis not present

## 2019-02-25 DIAGNOSIS — L570 Actinic keratosis: Secondary | ICD-10-CM | POA: Diagnosis not present

## 2019-02-25 DIAGNOSIS — L819 Disorder of pigmentation, unspecified: Secondary | ICD-10-CM | POA: Diagnosis not present

## 2019-03-10 DIAGNOSIS — H524 Presbyopia: Secondary | ICD-10-CM | POA: Diagnosis not present

## 2019-03-10 DIAGNOSIS — H2512 Age-related nuclear cataract, left eye: Secondary | ICD-10-CM | POA: Diagnosis not present

## 2019-04-06 DIAGNOSIS — C4441 Basal cell carcinoma of skin of scalp and neck: Secondary | ICD-10-CM | POA: Diagnosis not present

## 2019-04-22 DIAGNOSIS — H25812 Combined forms of age-related cataract, left eye: Secondary | ICD-10-CM | POA: Diagnosis not present

## 2019-04-22 DIAGNOSIS — H2512 Age-related nuclear cataract, left eye: Secondary | ICD-10-CM | POA: Diagnosis not present

## 2019-05-07 NOTE — Progress Notes (Signed)
Cardiology Office Note   Date:  05/08/2019   ID:  Cory [name], DOB [birth year], MRN [id number]  PCP:  [name], MD  Cardiologist:   [name], MD    Chief Complaint  Patient presents with  . Atrial Fibrillation      History of Present Illness: Cory [name] is a 73 y.o. male who presents for evaluation of PAF. At the last office visit I started him on Xarelto.  I took him off of metoprolol and started him on Cardizem last year.  Since I last saw him he has done well.  He does feel palpitations.  I stopped his beta-blocker because I was trying to see if Cardizem could improve his blood pressure and control his atrial fib.  However, he says that he had about 3 episodes in the last month.  Last week he had atrial fibrillation lasting about an hour.  He felt fairly symptomatic with this.  He is not had any presyncope or syncope.  He has not had any chest pressure, neck or arm discomfort.  Is had no weight gain or edema.  He unfortunately is still smoking cigarettes.  He is very active still in his yard walking. .   Past Medical History:  Diagnosis Date  . Aortic stenosis    a. Mild by echo 04/2015.  Marland Kitchen PAF (paroxysmal atrial fibrillation) ([place])   . Tobacco abuse     Past Surgical History:  Procedure Laterality Date  . CERVICAL SPINE SURGERY    . LEG SURGERY     Trauma  . RETINAL DETACHMENT SURGERY       Current Outpatient Medications  Medication Sig Dispense Refill  . cetirizine (ZYRTEC) 10 MG tablet Take 1 tablet (10 mg total) by mouth daily. 30 tablet 11  . diltiazem (CARDIZEM CD) 120 MG 24 hr capsule Take 1 capsule (120 mg total) by mouth daily. 90 capsule 3  . fluticasone (FLONASE) 50 MCG/ACT nasal spray Place 2 sprays into both nostrils daily. 16 g 6  . metoprolol tartrate (LOPRESSOR) 25 MG tablet Take 1 tablet (25 mg total) by mouth 2 (two) times daily as needed. 30 tablet 11  . rivaroxaban (XARELTO) 20 MG TABS tablet Take 1 tablet (20 mg total) by mouth  daily with supper. 30 tablet 11   No current facility-administered medications for this visit.     Allergies:   Azithromycin; Mucinex [guaifenesin er]; and 2,4-d dimethylamine (amisol)    ROS:  Please see the history of present illness.   Otherwise, review of systems are positive for none.   All other systems are reviewed and negative.    PHYSICAL EXAM: VS:  BP (!) 151/95   Pulse 69   Temp (!) 97 F (36.1 C)   Ht 6\' 4"  (1.93 m)   Wt 223 lb 6.4 oz (101.3 kg)   SpO2 95%   BMI 27.19 kg/m  , BMI Body mass index is 27.19 kg/m.  GENERAL:  Well appearing NECK:  No jugular venous distention, waveform within normal limits, carotid upstroke brisk and symmetric, no bruits, no thyromegaly LUNGS:  Clear to auscultation bilaterally CHEST:  Unremarkable HEART:  PMI not displaced or sustained,S1 and S2 within normal limits, no S3, no S4, no clicks, no rubs, 3 out of 6 apical early peaking systolic murmur, no diastolic murmurs ABD:  Flat, positive bowel sounds normal in frequency in pitch, no bruits, no rebound, no guarding, no midline pulsatile mass, no hepatomegaly, no splenomegaly EXT:  2 plus pulses throughout, no edema, no cyanosis no clubbing   EKG:  EKG is  ordered today. Sinus bradycardia, rate 69, axis within normal limits, intervals within normal limits, no acute ST-T wave changes.  Recent Labs: 05/16/2018: BUN 13; Creatinine, Ser 0.92; Hemoglobin 15.7; Platelets CANCELED; Potassium 3.8; Sodium 141    Lipid Panel    Component Value Date/Time   CHOL 160 04/29/2018 1133   TRIG 189.0 (H) 04/29/2018 1133   HDL 43.60 04/29/2018 1133   CHOLHDL 4 04/29/2018 1133   VLDL 37.8 04/29/2018 1133   LDLCALC 79 04/29/2018 1133      Wt Readings from Last 3 Encounters:  05/08/19 223 lb 6.4 oz (101.3 kg)  06/10/18 230 lb (104.3 kg)  05/16/18 231 lb (104.8 kg)      Other studies Reviewed: Additional studies/ records that were reviewed today include:   Labs Review of the above records  demonstrates:  See below   ASSESSMENT AND PLAN:  ATRIAL FIB:      He's having very few paroxysms. Cory [name] has a CHA2DS2 - VASc score of 3 almost.  Because he is having some increased palpitations.  Metoprolol 25 mg to take as a pill in pocket approach.  We will continue with anticoagulation.  I will check a CBC.  TOBACCO: He has not wanted to take Chantix.  We talked about this again today.   HTN:    Blood pressure is slightly elevated but this is unusual.  He says is usually well controlled and he is keeping a blood pressure diary.  No change in therapy otherwise.   AS: This is mild by exam.  No change in therapy.  We will follow this clinically.   Current medicines are reviewed at length with the patient today.  The patient does not have concerns regarding medicines.  The following changes have been made:  As above  Labs/ tests ordered today include:    Orders Placed This Encounter  Procedures  . CBC  . EKG 12-Lead     Disposition:   FU with me in 12 months.      Signed, [name], MD  05/08/2019 11:20 AM    [facility]

## 2019-05-08 ENCOUNTER — Other Ambulatory Visit: Payer: Self-pay

## 2019-05-08 ENCOUNTER — Encounter: Payer: Self-pay | Admitting: Cardiology

## 2019-05-08 ENCOUNTER — Other Ambulatory Visit: Payer: Self-pay | Admitting: Cardiology

## 2019-05-08 ENCOUNTER — Ambulatory Visit (INDEPENDENT_AMBULATORY_CARE_PROVIDER_SITE_OTHER): Payer: Medicare HMO | Admitting: Cardiology

## 2019-05-08 VITALS — BP 151/95 | HR 69 | Temp 97.0°F | Ht 76.0 in | Wt 223.4 lb

## 2019-05-08 DIAGNOSIS — I350 Nonrheumatic aortic (valve) stenosis: Secondary | ICD-10-CM | POA: Diagnosis not present

## 2019-05-08 DIAGNOSIS — I10 Essential (primary) hypertension: Secondary | ICD-10-CM | POA: Diagnosis not present

## 2019-05-08 DIAGNOSIS — I480 Paroxysmal atrial fibrillation: Secondary | ICD-10-CM | POA: Diagnosis not present

## 2019-05-08 DIAGNOSIS — Z720 Tobacco use: Secondary | ICD-10-CM | POA: Diagnosis not present

## 2019-05-08 DIAGNOSIS — R002 Palpitations: Secondary | ICD-10-CM

## 2019-05-08 MED ORDER — METOPROLOL TARTRATE 25 MG PO TABS: 25.0000 mg | ORAL_TABLET | Freq: Two times a day (BID) | ORAL | 11 refills | Status: DC | PRN

## 2019-05-08 NOTE — Patient Instructions (Signed)
Medication Instructions:  Take 25mg  Metoprolol 2 times daily as needed  If you need a refill on your cardiac medications before your next appointment, please call your pharmacy.   Lab work: CBC If you have labs (blood work) drawn today and your tests are completely normal, you will receive your results only by: [place] (if you have MyChart) OR A paper copy in the mail If you have any lab test that is abnormal or we need to change your treatment, we will call you to review the results.  Testing/Procedures: NONE  Follow-Up: At [facility], you and your health needs are our priority.  As part of our continuing mission to provide you with exceptional heart care, we have created designated Provider Care Teams.  These Care Teams include your primary Cardiologist (physician) and Advanced Practice Providers (APPs -  Physician Assistants and Nurse Practitioners) who all work together to provide you with the care you need, when you need it. You will need a follow up appointment in 12 months.  Please call our office 2 months in advance to schedule this appointment.  You may see Dr. Percival Spanish or one of the following Advanced Practice Providers on your designated Care Team:   Rosaria Ferries, PA-C Jory Sims, DNP, ANP

## 2019-05-09 LAB — CBC
Hematocrit: 49.1 % (ref 37.5–51.0)
Hemoglobin: 16.2 g/dL (ref 13.0–17.7)
MCH: 32.2 pg (ref 26.6–33.0)
MCHC: 33 g/dL (ref 31.5–35.7)
MCV: 98 fL — ABNORMAL HIGH (ref 79–97)
RBC: 5.03 x10E6/uL (ref 4.14–5.80)
RDW: 12 % (ref 11.6–15.4)
WBC: 6.7 10*3/uL (ref 3.4–10.8)

## 2019-08-28 ENCOUNTER — Ambulatory Visit: Payer: Medicare HMO | Attending: Internal Medicine

## 2019-08-28 DIAGNOSIS — Z23 Encounter for immunization: Secondary | ICD-10-CM

## 2019-08-28 NOTE — Progress Notes (Signed)
   Covid-19 Vaccination Clinic  Name:  Cory [name]    MRN: [id number] DOB: 1946/01/07  08/28/2019  [name] was observed post Covid-19 immunization for 15 minutes without incidence. He was provided with Vaccine Information Sheet and instruction to access the V-Safe system.   [name] was instructed to call 911 with any severe reactions post vaccine: Marland Kitchen Difficulty breathing  . Swelling of your face and throat  . A fast heartbeat  . A bad rash all over your body  . Dizziness and weakness    Immunizations Administered    Name Date Dose VIS Date Route   Pfizer COVID-19 Vaccine 08/28/2019  8:41 AM 0.3 mL 07/17/2019 Intramuscular   Manufacturer: [place]   Lot: [id number]   [place]: [id number]

## 2019-09-18 ENCOUNTER — Ambulatory Visit: Payer: Medicare HMO | Attending: Internal Medicine

## 2019-09-18 DIAGNOSIS — Z23 Encounter for immunization: Secondary | ICD-10-CM

## 2019-09-18 NOTE — Progress Notes (Signed)
   Covid-19 Vaccination Clinic  Name:  Cory [name]    MRN: [id number] DOB: February 04, 1946  09/18/2019  [name] was observed post Covid-19 immunization for 15 minutes without incidence. He was provided with Vaccine Information Sheet and instruction to access the V-Safe system.   [name] was instructed to call 911 with any severe reactions post vaccine: Marland Kitchen Difficulty breathing  . Swelling of your face and throat  . A fast heartbeat  . A bad rash all over your body  . Dizziness and weakness    Immunizations Administered    Name Date Dose VIS Date Route   Pfizer COVID-19 Vaccine 09/18/2019  9:36 AM 0.3 mL 07/17/2019 Intramuscular   Manufacturer: [place]   Lot: [id number]   [place]: [id number]

## 2019-09-22 ENCOUNTER — Ambulatory Visit: Payer: Medicare HMO

## 2019-11-26 DIAGNOSIS — Z961 Presence of intraocular lens: Secondary | ICD-10-CM | POA: Diagnosis not present

## 2020-02-06 ENCOUNTER — Other Ambulatory Visit: Payer: Self-pay | Admitting: Cardiology

## 2020-02-17 ENCOUNTER — Ambulatory Visit (INDEPENDENT_AMBULATORY_CARE_PROVIDER_SITE_OTHER): Payer: Medicare HMO | Admitting: Family Medicine

## 2020-02-17 ENCOUNTER — Other Ambulatory Visit: Payer: Self-pay

## 2020-02-17 VITALS — BP 128/74 | HR 64 | Temp 98.0°F | Wt 221.0 lb

## 2020-02-17 DIAGNOSIS — I4891 Unspecified atrial fibrillation: Secondary | ICD-10-CM

## 2020-02-17 DIAGNOSIS — M79605 Pain in left leg: Secondary | ICD-10-CM

## 2020-02-17 NOTE — Progress Notes (Signed)
Established Patient Office Visit  Subjective:  Patient ID: Cory [name], male    DOB: [birth year]  Age: 74 y.o. MRN: [id number]  CC: No chief complaint on file.   HPI Cory [name] presents for left leg pain for the past 3 weeks.  He thinks he may have injured this with playing golf.  He noticed some soreness that day.  He has pain mostly left lateral leg along the mid to distal aspect and somewhat more laterally.  He does not recall specific injury.  He states his pain is worse after prolonged ambulation.  He does not have any known history of peripheral vascular disease but has longstanding history of nicotine use.  Has not noted any color changes of the leg or foot.  He has tried some over-the-counter things without much improvement.  He tried topical-capsaicin cream-without improvement  He has history of atrial fibrillation and hypertension.  He remains on diltiazem and has metoprolol to use as needed.  He is supposed to be on Xarelto but took himself off.  Denies recent chest pain.   Past Medical History:  Diagnosis Date  . Aortic stenosis    a. Mild by echo 04/2015.  Marland Kitchen PAF (paroxysmal atrial fibrillation) ([place])   . Tobacco abuse     Past Surgical History:  Procedure Laterality Date  . CERVICAL SPINE SURGERY    . LEG SURGERY     Trauma  . RETINAL DETACHMENT SURGERY      Family History  Problem Relation Age of Onset  . Ovarian cancer Mother   . Alcoholism Father   . Breast cancer Sister   . Ovarian cancer Sister     Social History   Socioeconomic History  . Marital status: Married    Spouse name: Not on file  . Number of children: 5  . Years of education: Not on file  . Highest education level: Not on file  Occupational History  . Not on file  Tobacco Use  . Smoking status: Current Every Day Smoker    Packs/day: 1.00    Years: 50.00    Pack years: 50.00    Types: Cigarettes  . Smokeless tobacco: Never Used  Vaping Use  . Vaping Use: Never used  Substance  and Sexual Activity  . Alcohol use: Yes    Alcohol/week: 0.0 standard drinks    Comment: 4 to 5 times a week; beer, wine and liquor   . Drug use: No  . Sexual activity: Not on file  Other Topics Concern  . Not on file  Social History Narrative   Scores reading tests.  Lives with wife.     Social Determinants of Health   Financial Resource Strain:   . Difficulty of Paying Living Expenses:   Food Insecurity:   . Worried About Charity fundraiser in the Last Year:   . Arboriculturist in the Last Year:   Transportation Needs:   . Film/video editor (Medical):   Marland Kitchen Lack of Transportation (Non-Medical):   Physical Activity:   . Days of Exercise per Week:   . Minutes of Exercise per Session:   Stress:   . Feeling of Stress :   Social Connections:   . Frequency of Communication with Friends and Family:   . Frequency of Social Gatherings with Friends and Family:   . Attends Religious Services:   . Active Member of Clubs or Organizations:   . Attends Archivist Meetings:   .  Marital Status:   Intimate Partner Violence:   . Fear of Current or Ex-Partner:   . Emotionally Abused:   Marland Kitchen Physically Abused:   . Sexually Abused:     Outpatient Medications Prior to Visit  Medication Sig Dispense Refill  . cetirizine (ZYRTEC) 10 MG tablet Take 1 tablet (10 mg total) by mouth daily. 30 tablet 11  . diltiazem (CARDIZEM CD) 120 MG 24 hr capsule Take 1 capsule (120 mg total) by mouth daily. 90 capsule 3  . diltiazem (TIAZAC) 120 MG 24 hr capsule Take 1 capsule by mouth once daily 90 capsule 1  . fluticasone (FLONASE) 50 MCG/ACT nasal spray Place 2 sprays into both nostrils daily. 16 g 6  . metoprolol tartrate (LOPRESSOR) 25 MG tablet Take 1 tablet (25 mg total) by mouth 2 (two) times daily as needed. 30 tablet 11  . rivaroxaban (XARELTO) 20 MG TABS tablet Take 1 tablet (20 mg total) by mouth daily with supper. 30 tablet 11   No facility-administered medications prior to visit.     Allergies  Allergen Reactions  . Azithromycin Diarrhea  . Mucinex [Guaifenesin Er]     TAKES MUCINEX WITH NO PROBLEMS   . 2,4-D Dimethylamine (Amisol) Other (See Comments) and Itching    ROS Review of Systems  Constitutional: Negative for fatigue.  Eyes: Negative for visual disturbance.  Respiratory: Negative for cough, chest tightness and shortness of breath.   Cardiovascular: Negative for chest pain, palpitations and leg swelling.  Endocrine: Negative for polydipsia and polyuria.  Neurological: Negative for dizziness, syncope, weakness, light-headedness and headaches.      Objective:    Physical Exam Constitutional:      Appearance: He is well-developed.  HENT:     Right Ear: External ear normal.     Left Ear: External ear normal.  Eyes:     Pupils: Pupils are equal, round, and reactive to light.  Neck:     Thyroid: No thyromegaly.  Cardiovascular:     Rate and Rhythm: Normal rate and regular rhythm.     Comments: Feet reveal palpable posterior tibial on the right but not on the left.  Cannot palpate dorsalis pedis on either foot.  He has delayed capillary refill left foot compared to the right.  He has full strength with dorsiflexion plantarflexion.  He has little bit of mild left lateral calf tenderness.  He has mild pain with dorsiflexion on the left against resistance but not plantarflexion.  No Achilles tenderness.  Mild retrocalcaneal tenderness. Pulmonary:     Effort: Pulmonary effort is normal. No respiratory distress.     Breath sounds: Normal breath sounds. No wheezing or rales.  Musculoskeletal:     Cervical back: Neck supple.  Neurological:     Mental Status: He is alert and oriented to person, place, and time.     BP 128/74 (BP Location: Left Arm, Patient Position: Sitting, Cuff Size: Normal)   Pulse 64   Temp 98 F (36.7 C) (Oral)   Wt 221 lb (100.2 kg)   SpO2 94%   BMI 26.90 kg/m  Wt Readings from Last 3 Encounters:  02/17/20 221 lb (100.2  kg)  05/08/19 223 lb 6.4 oz (101.3 kg)  06/10/18 230 lb (104.3 kg)     Health Maintenance Due  Topic Date Due  . Hepatitis C Screening  Never done  . TETANUS/TDAP  08/06/2013    There are no preventive care reminders to display for this patient.  Lab Results  Component Value  Date   TSH 0.77 03/08/2015   Lab Results  Component Value Date   WBC 6.7 05/08/2019   HGB 16.2 05/08/2019   HCT 49.1 05/08/2019   MCV 98 (H) 05/08/2019   PLT CANCELED 05/08/2019   Lab Results  Component Value Date   NA 141 05/16/2018   K 3.8 05/16/2018   CO2 24 05/16/2018   GLUCOSE 111 (H) 05/16/2018   BUN 13 05/16/2018   CREATININE 0.92 05/16/2018   BILITOT 0.5 04/01/2013   ALKPHOS 39 04/01/2013   AST 17 04/01/2013   ALT 18 04/01/2013   PROT 6.8 04/01/2013   ALBUMIN 3.9 04/01/2013   CALCIUM 9.3 05/16/2018   GFR 87.77 04/01/2013   Lab Results  Component Value Date   CHOL 160 04/29/2018   Lab Results  Component Value Date   HDL 43.60 04/29/2018   Lab Results  Component Value Date   LDLCALC 79 04/29/2018   Lab Results  Component Value Date   TRIG 189.0 (H) 04/29/2018   Lab Results  Component Value Date   CHOLHDL 4 04/29/2018   No results found for: HGBA1C    Assessment & Plan:   Leg/calf pain.  No clinical suspicion for DVT.  May have component of muscle strain but also concern for possible claudication.  He has some definite findings on exam that suggest some peripheral vascular disease left greater than right  -Set up ultrasound ABI lower extremities -Consider over-the-counter Voltaren gel to use 3-4 times daily to left calf muscle.  -Strongly encouraged to stop smoking -We discussed getting back on Xarelto but he is very reluctant.  He is aware of increased stroke risk with not taking anticoagulation.  Fortunately, he appears to be in sinus rhythm today. -Recommend setting up complete physical.  No orders of the defined types were placed in this  encounter.   Follow-up: No follow-ups on file.    Carolann Littler, MD

## 2020-02-17 NOTE — Patient Instructions (Signed)
Try over the counter Voltaren gel and use 3-4 times daily to left calf.  I will set up arterial doppler study of lower extremity.   Try to quit smoking.

## 2020-03-10 ENCOUNTER — Other Ambulatory Visit: Payer: Self-pay | Admitting: Family Medicine

## 2020-03-10 DIAGNOSIS — R0989 Other specified symptoms and signs involving the circulatory and respiratory systems: Secondary | ICD-10-CM

## 2020-03-10 DIAGNOSIS — I739 Peripheral vascular disease, unspecified: Secondary | ICD-10-CM

## 2020-03-10 DIAGNOSIS — M79605 Pain in left leg: Secondary | ICD-10-CM

## 2020-03-22 ENCOUNTER — Other Ambulatory Visit: Payer: Self-pay

## 2020-03-22 ENCOUNTER — Ambulatory Visit (HOSPITAL_COMMUNITY)
Admission: RE | Admit: 2020-03-22 | Discharge: 2020-03-22 | Disposition: A | Discharge status: Home / Self Care | Payer: Medicare HMO | Source: Ambulatory Visit | Attending: Cardiovascular Disease | Admitting: Cardiovascular Disease

## 2020-03-22 DIAGNOSIS — R0989 Other specified symptoms and signs involving the circulatory and respiratory systems: Secondary | ICD-10-CM | POA: Diagnosis not present

## 2020-03-22 DIAGNOSIS — M79605 Pain in left leg: Secondary | ICD-10-CM | POA: Diagnosis not present

## 2020-03-28 ENCOUNTER — Encounter: Payer: Self-pay | Admitting: Family Medicine

## 2020-04-01 ENCOUNTER — Other Ambulatory Visit: Payer: Self-pay

## 2020-04-01 ENCOUNTER — Ambulatory Visit (HOSPITAL_COMMUNITY)
Admission: RE | Admit: 2020-04-01 | Discharge: 2020-04-01 | Disposition: A | Discharge status: Home / Self Care | Payer: Medicare HMO | Source: Ambulatory Visit | Attending: Internal Medicine | Admitting: Internal Medicine

## 2020-04-01 DIAGNOSIS — R0989 Other specified symptoms and signs involving the circulatory and respiratory systems: Secondary | ICD-10-CM | POA: Diagnosis not present

## 2020-04-02 NOTE — Addendum Note (Signed)
Addended by: [name] on: 04/02/2020 01:54 PM   Modules accepted: Orders

## 2020-04-06 ENCOUNTER — Encounter: Payer: Self-pay | Admitting: Family Medicine

## 2020-04-13 ENCOUNTER — Encounter: Payer: Self-pay | Admitting: Family Medicine

## 2020-04-15 NOTE — Telephone Encounter (Signed)
Please advise 

## 2020-04-25 ENCOUNTER — Ambulatory Visit (INDEPENDENT_AMBULATORY_CARE_PROVIDER_SITE_OTHER): Payer: Medicare HMO | Admitting: Family Medicine

## 2020-04-25 ENCOUNTER — Other Ambulatory Visit: Payer: Self-pay

## 2020-04-25 ENCOUNTER — Encounter: Payer: Self-pay | Admitting: Family Medicine

## 2020-04-25 VITALS — BP 120/82 | HR 74 | Temp 98.2°F | Ht 76.0 in | Wt 223.0 lb

## 2020-04-25 DIAGNOSIS — S91302A Unspecified open wound, left foot, initial encounter: Secondary | ICD-10-CM

## 2020-04-25 DIAGNOSIS — I739 Peripheral vascular disease, unspecified: Secondary | ICD-10-CM | POA: Diagnosis not present

## 2020-04-25 DIAGNOSIS — I714 Abdominal aortic aneurysm, without rupture, unspecified: Secondary | ICD-10-CM | POA: Insufficient documentation

## 2020-04-25 MED ORDER — ROSUVASTATIN CALCIUM 10 MG PO TABS: 10.0000 mg | ORAL_TABLET | Freq: Every day | ORAL | 3 refills | Status: DC

## 2020-04-25 MED ORDER — CEPHALEXIN 500 MG PO CAPS: 500.0000 mg | ORAL_CAPSULE | Freq: Four times a day (QID) | ORAL | 0 refills | Status: DC

## 2020-04-25 NOTE — Patient Instructions (Signed)

## 2020-04-25 NOTE — Progress Notes (Signed)
Established Patient Office Visit  Subjective:  Patient ID: Cory [name], male    DOB: 12-29-1945  Age: 74 y.o. MRN: [id number]  CC:  Chief Complaint  Patient presents with  . Leg Pain    HPI Cory [name] presents for follow-up regarding recent left lower extremity claudication symptoms and abnormal Doppler studies. His left lower extremity claudication symptoms are unchanged.  He has some achy pain in the left calf with just ambulating about 25 yards.  His Doppler studies revealed 3.9 x 3.7 cm abdominal aortic aneurysm.  He had aneurysmal dilatation of the right common iliac artery 4.4 x 4.5 cm.  Recent eye ABI on the right 1.23 and 0.52 on the left.  Long history of smoking.  He has history of hypertension.  History of atrial fibrillation maintained on Xarelto.  Currently not on statin.  Most recent LDL on record 29  Recently noticed a small split in the skin left lateral heel region.  Mild surrounding erythema.  No drainage.  Has very dry skin.  No history of diabetes  Past Medical History:  Diagnosis Date  . Aortic stenosis    a. Mild by echo 04/2015.  Marland Kitchen PAF (paroxysmal atrial fibrillation) ([place])   . Tobacco abuse     Past Surgical History:  Procedure Laterality Date  . CERVICAL SPINE SURGERY    . LEG SURGERY     Trauma  . RETINAL DETACHMENT SURGERY      Family History  Problem Relation Age of Onset  . Ovarian cancer Mother   . Alcoholism Father   . Breast cancer Sister   . Ovarian cancer Sister     Social History   Socioeconomic History  . Marital status: Married    Spouse name: Not on file  . Number of children: 5  . Years of education: Not on file  . Highest education level: Not on file  Occupational History  . Not on file  Tobacco Use  . Smoking status: Current Every Day Smoker    Packs/day: 1.00    Years: 50.00    Pack years: 50.00    Types: Cigarettes  . Smokeless tobacco: Never Used  Vaping Use  . Vaping Use: Never used  Substance and Sexual  Activity  . Alcohol use: Yes    Alcohol/week: 0.0 standard drinks    Comment: 4 to 5 times a week; beer, wine and liquor   . Drug use: No  . Sexual activity: Not on file  Other Topics Concern  . Not on file  Social History Narrative   Scores reading tests.  Lives with wife.     Social Determinants of Health   Financial Resource Strain:   . Difficulty of Paying Living Expenses: Not on file  Food Insecurity:   . Worried About Charity fundraiser in the Last Year: Not on file  . Ran Out of Food in the Last Year: Not on file  Transportation Needs:   . Lack of Transportation (Medical): Not on file  . Lack of Transportation (Non-Medical): Not on file  Physical Activity:   . Days of Exercise per Week: Not on file  . Minutes of Exercise per Session: Not on file  Stress:   . Feeling of Stress : Not on file  Social Connections:   . Frequency of Communication with Friends and Family: Not on file  . Frequency of Social Gatherings with Friends and Family: Not on file  . Attends Religious Services: Not on file  .  Active Member of Clubs or Organizations: Not on file  . Attends Archivist Meetings: Not on file  . Marital Status: Not on file  Intimate Partner Violence:   . Fear of Current or Ex-Partner: Not on file  . Emotionally Abused: Not on file  . Physically Abused: Not on file  . Sexually Abused: Not on file    Outpatient Medications Prior to Visit  Medication Sig Dispense Refill  . cetirizine (ZYRTEC) 10 MG tablet Take 1 tablet (10 mg total) by mouth daily. 30 tablet 11  . diltiazem (TIAZAC) 120 MG 24 hr capsule Take 1 capsule by mouth once daily 90 capsule 1  . fluticasone (FLONASE) 50 MCG/ACT nasal spray Place 2 sprays into both nostrils daily. 16 g 6  . rivaroxaban (XARELTO) 20 MG TABS tablet Take 1 tablet (20 mg total) by mouth daily with supper. 30 tablet 11  . diltiazem (CARDIZEM CD) 120 MG 24 hr capsule Take 1 capsule (120 mg total) by mouth daily. 90 capsule 3    . metoprolol tartrate (LOPRESSOR) 25 MG tablet Take 1 tablet (25 mg total) by mouth 2 (two) times daily as needed. 30 tablet 11   No facility-administered medications prior to visit.    Allergies  Allergen Reactions  . Azithromycin Diarrhea  . Mucinex [Guaifenesin Er]     TAKES MUCINEX WITH NO PROBLEMS   . 2,4-D Dimethylamine (Amisol) Other (See Comments) and Itching    ROS Review of Systems  Constitutional: Negative for chills and fever.  Respiratory: Negative for cough.   Cardiovascular: Negative for chest pain.  Gastrointestinal: Negative for abdominal pain.  Neurological: Negative for dizziness.      Objective:    Physical Exam Vitals reviewed.  Constitutional:      Appearance: Normal appearance.  Cardiovascular:     Rate and Rhythm: Normal rate.  Skin:    Comments: Very small linear fissure left lateral heel.  No purulent drainage.  No necrosis.  This is very superficial.  He has a area about 1 cm diameter surrounding of erythema  Left foot is slightly cool to touch.  Slow capillary refill.  Difficulty palpating dorsalis pedis or posterior tibial pulse.  No necrosis.  Neurological:     Mental Status: He is alert.     BP 120/82   Pulse 74   Temp 98.2 F (36.8 C) (Oral)   Ht 6\' 4"  (1.93 m)   Wt 223 lb (101.2 kg)   SpO2 95%   BMI 27.14 kg/m  Wt Readings from Last 3 Encounters:  04/25/20 223 lb (101.2 kg)  02/17/20 221 lb (100.2 kg)  05/08/19 223 lb 6.4 oz (101.3 kg)     Health Maintenance Due  Topic Date Due  . Hepatitis C Screening  Never done  . TETANUS/TDAP  08/06/2013  . INFLUENZA VACCINE  03/06/2020    There are no preventive care reminders to display for this patient.  Lab Results  Component Value Date   TSH 0.77 03/08/2015   Lab Results  Component Value Date   WBC 6.7 05/08/2019   HGB 16.2 05/08/2019   HCT 49.1 05/08/2019   MCV 98 (H) 05/08/2019   PLT CANCELED 05/08/2019   Lab Results  Component Value Date   NA 141 05/16/2018    K 3.8 05/16/2018   CO2 24 05/16/2018   GLUCOSE 111 (H) 05/16/2018   BUN 13 05/16/2018   CREATININE 0.92 05/16/2018   BILITOT 0.5 04/01/2013   ALKPHOS 39 04/01/2013   AST  17 04/01/2013   ALT 18 04/01/2013   PROT 6.8 04/01/2013   ALBUMIN 3.9 04/01/2013   CALCIUM 9.3 05/16/2018   GFR 87.77 04/01/2013   Lab Results  Component Value Date   CHOL 160 04/29/2018   Lab Results  Component Value Date   HDL 43.60 04/29/2018   Lab Results  Component Value Date   LDLCALC 79 04/29/2018   Lab Results  Component Value Date   TRIG 189.0 (H) 04/29/2018   Lab Results  Component Value Date   CHOLHDL 4 04/29/2018   No results found for: HGBA1C    Assessment & Plan:   #1 peripheral vascular disease with involvement left lower extremity with pending vascular consultation.  Claudication symptoms on the left with minimal activity -Pending referral to vascular surgery -Strongly advised no smoking -Advised statin use with Crestor 10 mg daily  #2 very small superficial split in the skin left lateral heel with possibly very early cellulitis changes.  High risk for infection and poor healing with his poor vascular flow. -Start Keflex 500 mg 4 times daily for 7 days.  Watch for increased erythema or drainage  #3 abdominal aortic aneurysm and right common iliac artery aneurysm per recent lower extremity Doppler -Vascular consult as above  Meds ordered this encounter  Medications  . cephALEXin (KEFLEX) 500 MG capsule    Sig: Take 1 capsule (500 mg total) by mouth 4 (four) times daily.    Dispense:  28 capsule    Refill:  0  . rosuvastatin (CRESTOR) 10 MG tablet    Sig: Take 1 tablet (10 mg total) by mouth daily.    Dispense:  90 tablet    Refill:  3    Follow-up: Return in about 2 months (around 06/25/2020).    Carolann Littler, MD

## 2020-05-04 ENCOUNTER — Telehealth: Payer: Self-pay | Admitting: Family Medicine

## 2020-05-04 NOTE — Telephone Encounter (Signed)
Left message for patient to schedule Annual Wellness Visit.  Please schedule with Nurse Health Advisor Shannon Crews, RN at [facility]  

## 2020-05-12 ENCOUNTER — Telehealth: Payer: Self-pay | Admitting: Family Medicine

## 2020-05-12 DIAGNOSIS — Z720 Tobacco use: Secondary | ICD-10-CM | POA: Insufficient documentation

## 2020-05-12 DIAGNOSIS — Z7189 Other specified counseling: Secondary | ICD-10-CM | POA: Insufficient documentation

## 2020-05-12 NOTE — Progress Notes (Signed)
Cardiology Office Note   Date:  05/13/2020   ID:  Cory [name], DOB [birth year], MRN [id number]  PCP:  [name], MD  Cardiologist:   [name], MD    Chief Complaint  Patient presents with  . Leg Pain      History of Present Illness: Cory [name] is a 74 y.o. male who presents for evaluation of PAF.  Since I last saw him he has had some leg pain.  He had lower extremity Dopplers demonstrating reduced blood flow and aneurysmal dilatation and is having referral to [facility].  He has not had any cardiac complaints except one episode of 6 hours probable atrial fibrillation.  He took some metoprolol and it went away.  He did not present to the emergency room.  Otherwise been okay.  He still smoking cigarettes.  He denies any chest pressure, neck or arm discomfort.  He does not feel his atrial fibrillation.  He has had no palpitations, presyncope or syncope.  He has had no problems taking the blood thinner.    Past Medical History:  Diagnosis Date  . Aortic stenosis    a. Mild by echo 04/2015.  Marland Kitchen PAF (paroxysmal atrial fibrillation) ([place])   . Tobacco abuse     Past Surgical History:  Procedure Laterality Date  . CERVICAL SPINE SURGERY    . LEG SURGERY     Trauma  . RETINAL DETACHMENT SURGERY       Current Outpatient Medications  Medication Sig Dispense Refill  . cetirizine (ZYRTEC) 10 MG tablet Take 1 tablet (10 mg total) by mouth daily. 30 tablet 11  . diltiazem (TIAZAC) 120 MG 24 hr capsule Take 1 capsule by mouth once daily 90 capsule 1  . fluticasone (FLONASE) 50 MCG/ACT nasal spray Place 2 sprays into both nostrils daily. 16 g 6  . rivaroxaban (XARELTO) 20 MG TABS tablet Take 1 tablet (20 mg total) by mouth daily with supper. 30 tablet 11  . rosuvastatin (CRESTOR) 10 MG tablet Take 1 tablet (10 mg total) by mouth daily. 90 tablet 3  . diltiazem (CARDIZEM CD) 120 MG 24 hr capsule Take 1 capsule (120 mg total) by mouth daily. 90 capsule 3  . metoprolol tartrate  (LOPRESSOR) 25 MG tablet Take 1 tablet (25 mg total) by mouth 2 (two) times daily as needed. 30 tablet 11   No current facility-administered medications for this visit.    Allergies:   Azithromycin; Mucinex [guaifenesin er]; and 2,4-d dimethylamine (amisol)    ROS:  Please see the history of present illness.   Otherwise, review of systems are positive for none.   All other systems are reviewed and negative.    PHYSICAL EXAM: VS:  BP 130/81   Pulse 65   Temp (!) 97 F (36.1 C)   Ht 5\' 4"  (1.626 m)   Wt 223 lb 9.6 oz (101.4 kg)   SpO2 94%   BMI 38.38 kg/m  , BMI Body mass index is 38.38 kg/m.  GENERAL:  Well appearing NECK:  No jugular venous distention, waveform within normal limits, carotid upstroke brisk and symmetric, no bruits, no thyromegaly LUNGS:  Clear to auscultation bilaterally CHEST:  Unremarkable HEART:  PMI not displaced or sustained,S1 and S2 within normal limits, no S3, no S4, no clicks, no rubs, 3 out of 6 apical systolic murmur early to mid peaking and radiating out the aortic outflow tract, no diastolic murmurs ABD:  Flat, positive bowel sounds normal in frequency in  pitch, no bruits, no rebound, no guarding, no midline pulsatile mass, no hepatomegaly, no splenomegaly EXT:  2 plus pulses dorsalis pedis and posterior tibialis bilateral lower, no edema, no cyanosis no clubbing   EKG:  EKG is   ordered today. Sinus bradycardia, rate 65, left axis deviation, left anterior fascicular block, intervals within normal limits, no acute ST-T wave changes.  Recent Labs: No results found for requested labs within last 8760 hours.    Lipid Panel    Component Value Date/Time   CHOL 160 04/29/2018 1133   TRIG 189.0 (H) 04/29/2018 1133   HDL 43.60 04/29/2018 1133   CHOLHDL 4 04/29/2018 1133   VLDL 37.8 04/29/2018 1133   LDLCALC 79 04/29/2018 1133      Wt Readings from Last 3 Encounters:  05/13/20 223 lb 9.6 oz (101.4 kg)  04/25/20 223 lb (101.2 kg)  02/17/20 221  lb (100.2 kg)      Other studies Reviewed: Additional studies/ records that were reviewed today include:   Labs and PV studies Review of the above records demonstrates:  See below   ASSESSMENT AND PLAN:  ATRIAL FIB:      He's having very few paroxysms. Mr. IDUS RATHKE has a CHA2DS2 - VASc score of 3 almost.  He tolerates anticoagulation and has had paroxysms.  No change in therapy.   TOBACCO:  He has not wanted to take Chantix.  We talked again about the need to stop smoking.   PVD: He has an abdominal aortic aneurysm 3.9 x 3.7 cm.  There is right common iliac artery aneurysm 4.4 x 4.4.  He is having follow-up with [facility] I pointed out again the importance of stopping smoking  HTN:   Controlled.  He will continue the meds as listed.   AS:   It has been over 5 years since this was checked and I will repeat an echocardiogram.   DYSLIPIDEMIA:  Burchette, Alinda Sierras, MD just started a statin and I told him in the computer to have a lipid profile liver enzymes in 7 weeks.  The goal will be an LDL less than 70.  COVID EDUCATION: He has been vaccinated.  We talked about the booster.  Current medicines are reviewed at length with the patient today.  The patient does not have concerns regarding medicines.  The following changes have been made: None.  Labs/ tests ordered today include:    Orders Placed This Encounter  Procedures  . Lipid panel  . Hepatic function panel  . EKG 12-Lead  . ECHOCARDIOGRAM COMPLETE     Disposition:   FU with me in 12 months.      Signed, [name], MD  05/13/2020 11:36 AM    [facility]

## 2020-05-12 NOTE — Progress Notes (Signed)
  Chronic Care Management   Outreach Note  05/12/2020 Name: Cory [name] MRN: [id number] DOB: 12/24/1945  Referred by: [name], MD Reason for referral : No chief complaint on file.   An unsuccessful telephone outreach was attempted today. The patient was referred to the pharmacist for assistance with care management and care coordination.   Follow Up Plan:   Carley Perdue [employer] Scheduler

## 2020-05-13 ENCOUNTER — Other Ambulatory Visit: Payer: Self-pay

## 2020-05-13 ENCOUNTER — Ambulatory Visit: Payer: Medicare HMO | Admitting: Cardiology

## 2020-05-13 ENCOUNTER — Encounter: Payer: Self-pay | Admitting: Cardiology

## 2020-05-13 VITALS — BP 130/81 | HR 65 | Temp 97.0°F | Ht 64.0 in | Wt 223.6 lb

## 2020-05-13 DIAGNOSIS — Z720 Tobacco use: Secondary | ICD-10-CM | POA: Diagnosis not present

## 2020-05-13 DIAGNOSIS — I350 Nonrheumatic aortic (valve) stenosis: Secondary | ICD-10-CM | POA: Diagnosis not present

## 2020-05-13 DIAGNOSIS — Z7189 Other specified counseling: Secondary | ICD-10-CM | POA: Diagnosis not present

## 2020-05-13 DIAGNOSIS — I10 Essential (primary) hypertension: Secondary | ICD-10-CM

## 2020-05-13 DIAGNOSIS — I4891 Unspecified atrial fibrillation: Secondary | ICD-10-CM

## 2020-05-13 DIAGNOSIS — E785 Hyperlipidemia, unspecified: Secondary | ICD-10-CM | POA: Diagnosis not present

## 2020-05-13 MED ORDER — RIVAROXABAN 20 MG PO TABS
20.0000 mg | ORAL_TABLET | Freq: Every day | ORAL | 11 refills | Status: DC
Start: 2020-05-13 — End: 2021-05-30

## 2020-05-13 NOTE — Patient Instructions (Signed)
Medication Instructions:  Your physician recommends that you continue on your current medications as directed. Please refer to the Current Medication list given to you today.  Xarelto was refilled today  *If you need a refill on your cardiac medications before your next appointment, please call your pharmacy*   Lab Work: FASTING lipid panel/liver function test in 7 week (approx: Nov. 22 - Dec. 3) If you have labs (blood work) drawn today and your tests are completely normal, you will receive your results only by:  [place] (if you have MyChart) OR  A paper copy in the mail If you have any lab test that is abnormal or we need to change your treatment, we will call you to review the results.   Testing/Procedures: Echocardiogram @ [address]   Follow-Up: At [facility], you and your health needs are our priority.  As part of our continuing mission to provide you with exceptional heart care, we have created designated Provider Care Teams.  These Care Teams include your primary Cardiologist (physician) and Advanced Practice Providers (APPs -  Physician Assistants and Nurse Practitioners) who all work together to provide you with the care you need, when you need it.  We recommend signing up for the patient portal called "MyChart".  Sign up information is provided on this After Visit Summary.  MyChart is used to connect with patients for Virtual Visits (Telemedicine).  Patients are able to view lab/test results, encounter notes, upcoming appointments, etc.  Non-urgent messages can be sent to your provider as well.   To learn more about what you can do with MyChart, go to [email or web address].    Your next appointment:   12 month(s)  The format for your next appointment:   In Person  Provider:   You may see Dr. Percival Spanish or one of the following Advanced Practice Providers on your designated Care Team:    Rosaria Ferries, PA-C  Jory Sims, DNP,  ANP    Other Instructions

## 2020-05-18 ENCOUNTER — Telehealth: Payer: Self-pay | Admitting: Family Medicine

## 2020-05-18 NOTE — Progress Notes (Signed)
°  Chronic Care Management   Outreach Note  05/18/2020 Name: Cory [name] MRN: [id number] DOB: 04-Feb-1946  Referred by: [name], MD Reason for referral : No chief complaint on file.   A second unsuccessful telephone outreach was attempted today. The patient was referred to pharmacist for assistance with care management and care coordination.  Follow Up Plan:   Carley Perdue [employer] Scheduler

## 2020-05-26 ENCOUNTER — Other Ambulatory Visit: Payer: Self-pay

## 2020-05-26 ENCOUNTER — Ambulatory Visit (INDEPENDENT_AMBULATORY_CARE_PROVIDER_SITE_OTHER): Payer: Medicare HMO | Admitting: Vascular Surgery

## 2020-05-26 ENCOUNTER — Encounter: Payer: Self-pay | Admitting: Vascular Surgery

## 2020-05-26 VITALS — BP 132/82 | HR 67 | Temp 98.7°F | Resp 20 | Ht 76.0 in | Wt 222.0 lb

## 2020-05-26 DIAGNOSIS — I723 Aneurysm of iliac artery: Secondary | ICD-10-CM

## 2020-05-26 DIAGNOSIS — I714 Abdominal aortic aneurysm, without rupture, unspecified: Secondary | ICD-10-CM

## 2020-05-26 DIAGNOSIS — I724 Aneurysm of artery of lower extremity: Secondary | ICD-10-CM

## 2020-05-26 DIAGNOSIS — I739 Peripheral vascular disease, unspecified: Secondary | ICD-10-CM

## 2020-05-26 NOTE — Progress Notes (Signed)
He saw his primary care physician in July of this year for complaints of pain in his left leg with walking.  He gets a cramping sensation in her.  He was placed on Keflex about a month ago for some cellulitis in his left lateral heel over a crack.  This healed.  He has no abdominal or back pain.  He has no history of coronary artery disease.  He has no prior history of stroke.  He has no family history of aneurysm.  He is on Xarelto for atrial fibrillation.  He is also on a statin.  Current Outpatient Medications on File Prior to Visit  Medication Sig Dispense Refill  . cetirizine (ZYRTEC) 10 MG tablet Take 1 tablet (10 mg total) by mouth daily. 30 tablet 11  . diltiazem (TIAZAC) 120 MG 24 hr capsule Take 1 capsule by mouth once daily 90 capsule 1  . fluticasone (FLONASE) 50 MCG/ACT nasal spray Place 2 sprays into both nostrils daily. 16 g 6  . rivaroxaban (XARELTO) 20 MG TABS tablet Take 1 tablet (20 mg total) by mouth daily with supper. 30 tablet 11  . rosuvastatin (CRESTOR) 10 MG tablet Take 1 tablet (10 mg total) by mouth daily. 90 tablet 3  . diltiazem (CARDIZEM CD) 120 MG 24 hr capsule Take 1 capsule (120 mg total) by mouth daily. 90 capsule 3  . metoprolol tartrate (LOPRESSOR) 25 MG tablet Take 1 tablet (25 mg total) by mouth 2 (two) times daily as needed. 30 tablet 11   No current facility-administered medications on file prior to visit.   Past Medical History:  Diagnosis Date  . Aortic stenosis    a. Mild by echo 04/2015.  Marland Kitchen PAF (paroxysmal atrial fibrillation) ([place])   . Tobacco abuse     Past Surgical History:  Procedure Laterality Date  . CERVICAL SPINE SURGERY    . LEG SURGERY     Trauma  . RETINAL DETACHMENT SURGERY     Physical exam:  Vitals:   05/26/20 1238  BP: 132/82  Pulse: 67  Resp: 20  Temp: 98.7 F (37.1 C)  SpO2: 95%  Weight: 222 lb (100.7 kg)  Height: 6\' 4"  (1.93 m)    Extremities: 2-3+ bilateral femoral pulses absent popliteal 2+ right posterior  tibial absent dorsalis pedis bilaterally absent left posterior tibial  Skin: Intact no ulceration  Neuro: Symmetric upper extremity and lower extremity motor strength 5/5  Cardiac: Regular rate and rhythm  Abdomen: Soft nontender nondistended vaguely palpable aortic pulsation  Data: He had bilateral ABIs performed March 22, 2020.  I reviewed and interpreted those results today.  Right side was normal triphasic greater than 1.  Left side was 0.52 monophasic with a toe pressure of 50.  He also have lower extremity and aortic duplex performed on April 01, 2000.  This showed aortic aneurysm 3.9 cm diameter.  There was also a 4.5 cm right common iliac aneurysm.  There was a greater than 50% stenosis of the left external iliac artery and occlusion of the left popliteal artery which was also aneurysmal at 2.1 cm.  Of note he had an ultrasound of his abdomen performed in 2014 which showed no aneurysm.  Assessment: Aortic and iliac and left popliteal aneurysms with left popliteal artery occlusion and claudication symptoms.  Plan: Patient will be scheduled for a CT angiogram of the abdomen and pelvis with lower extremity runoff and see me back in 2 weeks.  We will make further decisions regarding his treatment plan  after the CT scan is performed.  [name], MD [facility] Office: [phone]

## 2020-05-31 ENCOUNTER — Ambulatory Visit (HOSPITAL_COMMUNITY): Payer: Medicare HMO | Attending: Cardiovascular Disease

## 2020-05-31 ENCOUNTER — Ambulatory Visit
Admission: RE | Admit: 2020-05-31 | Discharge: 2020-05-31 | Disposition: A | Discharge status: Home / Self Care | Payer: Medicare HMO | Source: Ambulatory Visit | Attending: Surgery | Admitting: Surgery

## 2020-05-31 ENCOUNTER — Other Ambulatory Visit: Payer: Self-pay

## 2020-05-31 DIAGNOSIS — I70202 Unspecified atherosclerosis of native arteries of extremities, left leg: Secondary | ICD-10-CM | POA: Diagnosis not present

## 2020-05-31 DIAGNOSIS — I714 Abdominal aortic aneurysm, without rupture, unspecified: Secondary | ICD-10-CM

## 2020-05-31 DIAGNOSIS — I4891 Unspecified atrial fibrillation: Secondary | ICD-10-CM | POA: Insufficient documentation

## 2020-05-31 DIAGNOSIS — N400 Enlarged prostate without lower urinary tract symptoms: Secondary | ICD-10-CM | POA: Diagnosis not present

## 2020-05-31 DIAGNOSIS — I350 Nonrheumatic aortic (valve) stenosis: Secondary | ICD-10-CM

## 2020-05-31 DIAGNOSIS — I723 Aneurysm of iliac artery: Secondary | ICD-10-CM | POA: Diagnosis not present

## 2020-05-31 LAB — ECHOCARDIOGRAM COMPLETE
AR max vel: 3.14 cm2
AV Area VTI: 3.79 cm2
AV Area mean vel: 2.84 cm2
AV Mean grad: 22.3 mmHg
AV Peak grad: 39.9 mmHg
Ao pk vel: 3.16 m/s
Area-P 1/2: 2.09 cm2
S' Lateral: 2.5 cm

## 2020-05-31 MED ORDER — IOPAMIDOL (ISOVUE-370) INJECTION 76%
125.0000 mL | Freq: Once | INTRAVENOUS | Status: AC | PRN
  Administered 2020-05-31: 125 mL via INTRAVENOUS

## 2020-06-01 ENCOUNTER — Other Ambulatory Visit: Payer: Self-pay | Admitting: Cardiology

## 2020-06-01 DIAGNOSIS — R002 Palpitations: Secondary | ICD-10-CM

## 2020-06-06 ENCOUNTER — Telehealth: Payer: Self-pay | Admitting: Cardiology

## 2020-06-06 DIAGNOSIS — I350 Nonrheumatic aortic (valve) stenosis: Secondary | ICD-10-CM

## 2020-06-06 NOTE — Telephone Encounter (Signed)
  Patient is calling to speak with the nurse regarding his echo results

## 2020-06-06 NOTE — Telephone Encounter (Signed)
Pt advised his Echo results and agreed to CT in one year. Will call back if he develops any further questions or problems.

## 2020-06-08 ENCOUNTER — Telehealth: Payer: Self-pay | Admitting: Family Medicine

## 2020-06-08 NOTE — Progress Notes (Signed)
°  Chronic Care Management   Outreach Note  06/08/2020 Name: Cory [name] MRN: [id number] DOB: 25-Apr-1946  Referred by: [name], MD Reason for referral : No chief complaint on file.   Third unsuccessful telephone outreach was attempted today. The patient was referred to the pharmacist for assistance with care management and care coordination.   Follow Up Plan:   Carley Perdue [employer] Scheduler

## 2020-06-09 ENCOUNTER — Other Ambulatory Visit: Payer: Self-pay

## 2020-06-09 ENCOUNTER — Ambulatory Visit: Payer: Medicare HMO | Admitting: Vascular Surgery

## 2020-06-09 ENCOUNTER — Telehealth: Payer: Self-pay | Admitting: Cardiology

## 2020-06-09 ENCOUNTER — Encounter: Payer: Self-pay | Admitting: Vascular Surgery

## 2020-06-09 VITALS — BP 132/85 | HR 67 | Temp 98.5°F | Resp 20 | Ht 76.0 in | Wt 224.0 lb

## 2020-06-09 DIAGNOSIS — I714 Abdominal aortic aneurysm, without rupture, unspecified: Secondary | ICD-10-CM

## 2020-06-09 DIAGNOSIS — I70212 Atherosclerosis of native arteries of extremities with intermittent claudication, left leg: Secondary | ICD-10-CM

## 2020-06-09 DIAGNOSIS — I723 Aneurysm of iliac artery: Secondary | ICD-10-CM | POA: Diagnosis not present

## 2020-06-09 NOTE — H&P (View-Only) (Signed)
Patient is a 74 year old male who returns for follow-up today.  He was last seen May 26, 2020.  At that time his primary complaint was left leg claudication.  His claudication occurs at approximately 1/2-1 block.  Left side ABI was 0.5.  Right side ABI was normal.  He also had a duplex exam which showed iliac and aortic aneurysms.  Ultrasound also showed occlusion of the left popliteal aneurysm.  He is on Xarelto for atrial fibrillation.  He is also on a statin.  Past Medical History:  Diagnosis Date  . Aortic stenosis    a. Mild by echo 04/2015.  Marland Kitchen PAF (paroxysmal atrial fibrillation) ([place])   . Tobacco abuse     Past Surgical History:  Procedure Laterality Date  . CERVICAL SPINE SURGERY    . LEG SURGERY     Trauma  . RETINAL DETACHMENT SURGERY     Social History   Socioeconomic History  . Marital status: Married    Spouse name: Not on file  . Number of children: 5  . Years of education: Not on file  . Highest education level: Not on file  Occupational History  . Not on file  Tobacco Use  . Smoking status: Current Every Day Smoker    Packs/day: 1.00    Years: 50.00    Pack years: 50.00    Types: Cigarettes  . Smokeless tobacco: Never Used  Vaping Use  . Vaping Use: Never used  Substance and Sexual Activity  . Alcohol use: Yes    Alcohol/week: 0.0 standard drinks    Comment: 4 to 5 times a week; beer, wine and liquor   . Drug use: No  . Sexual activity: Not on file  Other Topics Concern  . Not on file  Social History Narrative   Scores reading tests.  Lives with wife.     Social Determinants of Health   Financial Resource Strain:   . Difficulty of Paying Living Expenses: Not on file  Food Insecurity:   . Worried About Charity fundraiser in the Last Year: Not on file  . Ran Out of Food in the Last Year: Not on file  Transportation Needs:   . Lack of Transportation (Medical): Not on file  . Lack of Transportation (Non-Medical): Not on file  Physical Activity:    . Days of Exercise per Week: Not on file  . Minutes of Exercise per Session: Not on file  Stress:   . Feeling of Stress : Not on file  Social Connections:   . Frequency of Communication with Friends and Family: Not on file  . Frequency of Social Gatherings with Friends and Family: Not on file  . Attends Religious Services: Not on file  . Active Member of Clubs or Organizations: Not on file  . Attends Archivist Meetings: Not on file  . Marital Status: Not on file  Intimate Partner Violence:   . Fear of Current or Ex-Partner: Not on file  . Emotionally Abused: Not on file  . Physically Abused: Not on file  . Sexually Abused: Not on file    Review of systems: He has no shortness of breath.  He has no chest pain.  Physical exam:  Vitals:   06/09/20 1516  BP: 132/85  Pulse: 67  Resp: 20  Temp: 98.5 F (36.9 C)  SpO2: 94%  Weight: 224 lb (101.6 kg)  Height: 6\' 4"  (1.93 m)    Extremities: No open wounds, no pedal pulses left  foot 2+ right posterior tibial pulse.  Data: I reviewed the patient's recent CT angiogram of the abdomen and pelvis with runoff.  This shows a 3.8 cm abdominal aortic aneurysm.  There are 2 right renal arteries and a single left renal artery.  The aneurysm neck is about 2.8 cm diameter and extends over several centimeters.  There is a 4.5 cm right common iliac aneurysm and a 3.3 cm right internal iliac artery aneurysm.  Left iliac system is mild to moderately tortuous but patent with no evidence of aneurysm.  On the runoff the patient's right lower extremity is intact with posterior tibial peroneal runoff no popliteal aneurysm.  On the left side there is a 4 cm occlusion of the left popliteal artery with runoff via the posterior tibial and peroneal arteries.  Assessment: #1 right common and internal iliac artery aneurysm of size to warrant repair infrarenal abdominal aortic aneurysm 4 cm diameter would be repaired simultaneously.  This will require  staged repair with coil embolization of the right internal iliac artery followed by aneurysm stent graft repair.  We will make measurements of his CT and schedule this in the near future.  Risk benefits possible complications and procedure details were discussed the patient today he understands agrees to proceed.  We will schedule the coil embolization for November 19.  We will then proceed with a Gore Excluder stent graft repair on December 6.  We will touch base with Dr. Percival Spanish preoperatively to make sure he needs no further cardiac testing.  He saw him 2 weeks ago.  We will need to stop his Xarelto perioperatively.  2.  Left popliteal aneurysm occlusion.  This will not be amenable to percutaneous revascularization due to the lengthy occlusion and chances of distal embolization.  He will require a left above-knee to below-knee popliteal bypass and we will schedule this electively after he has recovered from his aneurysm repair.  Plan: See above  [name], MD [facility] [place] Office: [phone]

## 2020-06-09 NOTE — Progress Notes (Signed)
Patient is a 74 year old male who returns for follow-up today.  He was last seen May 26, 2020.  At that time his primary complaint was left leg claudication.  His claudication occurs at approximately 1/2-1 block.  Left side ABI was 0.5.  Right side ABI was normal.  He also had a duplex exam which showed iliac and aortic aneurysms.  Ultrasound also showed occlusion of the left popliteal aneurysm.  He is on Xarelto for atrial fibrillation.  He is also on a statin.  Past Medical History:  Diagnosis Date  . Aortic stenosis    a. Mild by echo 04/2015.  Marland Kitchen PAF (paroxysmal atrial fibrillation) ([place])   . Tobacco abuse     Past Surgical History:  Procedure Laterality Date  . CERVICAL SPINE SURGERY    . LEG SURGERY     Trauma  . RETINAL DETACHMENT SURGERY     Social History   Socioeconomic History  . Marital status: Married    Spouse name: Not on file  . Number of children: 5  . Years of education: Not on file  . Highest education level: Not on file  Occupational History  . Not on file  Tobacco Use  . Smoking status: Current Every Day Smoker    Packs/day: 1.00    Years: 50.00    Pack years: 50.00    Types: Cigarettes  . Smokeless tobacco: Never Used  Vaping Use  . Vaping Use: Never used  Substance and Sexual Activity  . Alcohol use: Yes    Alcohol/week: 0.0 standard drinks    Comment: 4 to 5 times a week; beer, wine and liquor   . Drug use: No  . Sexual activity: Not on file  Other Topics Concern  . Not on file  Social History Narrative   Scores reading tests.  Lives with wife.     Social Determinants of Health   Financial Resource Strain:   . Difficulty of Paying Living Expenses: Not on file  Food Insecurity:   . Worried About Charity fundraiser in the Last Year: Not on file  . Ran Out of Food in the Last Year: Not on file  Transportation Needs:   . Lack of Transportation (Medical): Not on file  . Lack of Transportation (Non-Medical): Not on file  Physical Activity:    . Days of Exercise per Week: Not on file  . Minutes of Exercise per Session: Not on file  Stress:   . Feeling of Stress : Not on file  Social Connections:   . Frequency of Communication with Friends and Family: Not on file  . Frequency of Social Gatherings with Friends and Family: Not on file  . Attends Religious Services: Not on file  . Active Member of Clubs or Organizations: Not on file  . Attends Archivist Meetings: Not on file  . Marital Status: Not on file  Intimate Partner Violence:   . Fear of Current or Ex-Partner: Not on file  . Emotionally Abused: Not on file  . Physically Abused: Not on file  . Sexually Abused: Not on file    Review of systems: He has no shortness of breath.  He has no chest pain.  Physical exam:  Vitals:   06/09/20 1516  BP: 132/85  Pulse: 67  Resp: 20  Temp: 98.5 F (36.9 C)  SpO2: 94%  Weight: 224 lb (101.6 kg)  Height: 6\' 4"  (1.93 m)    Extremities: No open wounds, no pedal pulses left  foot 2+ right posterior tibial pulse.  Data: I reviewed the patient's recent CT angiogram of the abdomen and pelvis with runoff.  This shows a 3.8 cm abdominal aortic aneurysm.  There are 2 right renal arteries and a single left renal artery.  The aneurysm neck is about 2.8 cm diameter and extends over several centimeters.  There is a 4.5 cm right common iliac aneurysm and a 3.3 cm right internal iliac artery aneurysm.  Left iliac system is mild to moderately tortuous but patent with no evidence of aneurysm.  On the runoff the patient's right lower extremity is intact with posterior tibial peroneal runoff no popliteal aneurysm.  On the left side there is a 4 cm occlusion of the left popliteal artery with runoff via the posterior tibial and peroneal arteries.  Assessment: #1 right common and internal iliac artery aneurysm of size to warrant repair infrarenal abdominal aortic aneurysm 4 cm diameter would be repaired simultaneously.  This will require  staged repair with coil embolization of the right internal iliac artery followed by aneurysm stent graft repair.  We will make measurements of his CT and schedule this in the near future.  Risk benefits possible complications and procedure details were discussed the patient today he understands agrees to proceed.  We will schedule the coil embolization for November 19.  We will then proceed with a Gore Excluder stent graft repair on December 6.  We will touch base with Dr. Percival Spanish preoperatively to make sure he needs no further cardiac testing.  He saw him 2 weeks ago.  We will need to stop his Xarelto perioperatively.  2.  Left popliteal aneurysm occlusion.  This will not be amenable to percutaneous revascularization due to the lengthy occlusion and chances of distal embolization.  He will require a left above-knee to below-knee popliteal bypass and we will schedule this electively after he has recovered from his aneurysm repair.  Plan: See above  [name], MD [facility] [employer] Office: [phone]

## 2020-06-09 NOTE — Telephone Encounter (Signed)
I do not suggest an ischemia work up and he can hold Xarelto for the procedure.

## 2020-06-09 NOTE — Telephone Encounter (Signed)
   [facility] Pre-operative Risk Assessment    Request for surgical clearance:  1. What type of surgery is being performed? Coil Embolization of the Right Internal IIliac &  EVAR  2. When is this surgery scheduled? 06/24/20   3. What type of clearance is required (medical clearance vs. Pharmacy clearance to hold med vs. Both)? Medical clearance  4. Are there any medications that need to be held prior to surgery and how long? Lyn with [facility] states she is unsure.  5. Practice name and name of physician performing surgery? [facility]  Dr. Oneida Alar   6. What is your office phone number? [phone]    7.   What is your office fax number? [phone] (ATTN: Kia)   8.   Anesthesia type (None, local, MAC, general) ? Not sure   Cory [name] 06/09/2020, 3:38 PM  _________________________________________________________________

## 2020-06-09 NOTE — Telephone Encounter (Signed)
Left a voicemail for the patient to call back and speak to the on-call preop APP of the day 

## 2020-06-09 NOTE — Telephone Encounter (Signed)
Dr. Percival Spanish to review.  Cory [name] is pending vascular surgery.  He denies any recent exertional chest pain or shortness of breath.  He frequently works in the yard without any issue.  He also can climb up 2 flight of stairs or walk 2 blocks away from his home and back without any exertional symptoms.  He is able to accomplish at least 4 METS of activity.  However given the high risk nature of the surgery, will defer to MD to see if any ischemic work-up is needed.  Dr. Percival Spanish, please send a response to P CV [facility] PREOP  Clinical pharmacist to review Xarelto

## 2020-06-09 NOTE — Telephone Encounter (Signed)
Patient with diagnosis of afib on Xarelto for anticoagulation.    Procedure: Coil Embolization of the Right Internal IIliac &  EVAR Date of procedure: 06/24/20    CHA2DS2-VASc Score = 4  This indicates a 4.8% annual risk of stroke. The patient's score is based upon: CHF History: 0 HTN History: 1 Diabetes History: 0 Stroke History: 0 Vascular Disease History: 1 Age Score: 2 Gender Score: 0      Patient has not had a scr drawn within the last year. Will need a BMP done prior to giving clearance as this will be used to determine how many days he needs to hold.

## 2020-06-10 ENCOUNTER — Other Ambulatory Visit: Payer: Self-pay

## 2020-06-10 ENCOUNTER — Telehealth: Payer: Self-pay

## 2020-06-10 DIAGNOSIS — I714 Abdominal aortic aneurysm, without rupture, unspecified: Secondary | ICD-10-CM

## 2020-06-10 NOTE — Telephone Encounter (Signed)
Pre-op covering staff, can you please let patient know that per Dr. Percival Spanish he is OK for surgery but he needs to come in for a BMET so that Pharmacy can give recommendations for how long he can hold Xarelto? Can you please help arrange BMET?   Thank you!

## 2020-06-10 NOTE — Telephone Encounter (Signed)
Update: Dr. Percival Spanish said OK to hold Xarelto so prep-op covering staff I think it is OK if we don't get BMET. I spoke with [name] in the cath lab who states they usually hold Xarelto for 48 hours for this procedure. We will just plan to check BMET in short stay.  Thank you!

## 2020-06-10 NOTE — Telephone Encounter (Signed)
   Primary Cardiologist: Dr. Percival Spanish  Chart reviewed as part of pre-operative protocol coverage. Patient was last seen by Dr. Percival Spanish on 05/13/2020 at which time he was doing well. Patient was contacted on 06/09/2020 for further pre-op evaluation. He was doing well at that time from a cardiac standpoint. He denied any exertional chest pain or shortness of breath and was able to complete >4.0 METS. Dr. Percival Spanish did not feel like any additional ischemic evaluation was needed piror to procedure.  Spoke with [name] in the cath lab who stated Xarelto is usually held for 48 hours prior to procedure. Per Dr. Percival Spanish, [place] to hold Xarelto. This should be restarted as soon as able following procedure.  I will route this recommendation to the requesting party via Epic fax function and remove from pre-op pool.  Please call with questions.  Darreld Mclean, PA-C 06/10/2020, 10:35 AM

## 2020-06-10 NOTE — Telephone Encounter (Signed)
Opened in error

## 2020-06-22 ENCOUNTER — Other Ambulatory Visit (HOSPITAL_COMMUNITY)
Admission: RE | Admit: 2020-06-22 | Discharge: 2020-06-22 | Disposition: A | Discharge status: Home / Self Care | Payer: Medicare HMO | Source: Ambulatory Visit | Attending: Vascular Surgery | Admitting: Vascular Surgery

## 2020-06-22 DIAGNOSIS — Z803 Family history of malignant neoplasm of breast: Secondary | ICD-10-CM | POA: Diagnosis not present

## 2020-06-22 DIAGNOSIS — R58 Hemorrhage, not elsewhere classified: Secondary | ICD-10-CM | POA: Diagnosis not present

## 2020-06-22 DIAGNOSIS — D62 Acute posthemorrhagic anemia: Secondary | ICD-10-CM | POA: Diagnosis not present

## 2020-06-22 DIAGNOSIS — S7011XA Contusion of right thigh, initial encounter: Secondary | ICD-10-CM | POA: Diagnosis not present

## 2020-06-22 DIAGNOSIS — Z881 Allergy status to other antibiotic agents status: Secondary | ICD-10-CM | POA: Diagnosis not present

## 2020-06-22 DIAGNOSIS — R52 Pain, unspecified: Secondary | ICD-10-CM | POA: Diagnosis not present

## 2020-06-22 DIAGNOSIS — I97638 Postprocedural hematoma of a circulatory system organ or structure following other circulatory system procedure: Secondary | ICD-10-CM | POA: Diagnosis not present

## 2020-06-22 DIAGNOSIS — Z23 Encounter for immunization: Secondary | ICD-10-CM | POA: Diagnosis present

## 2020-06-22 DIAGNOSIS — I739 Peripheral vascular disease, unspecified: Secondary | ICD-10-CM | POA: Diagnosis present

## 2020-06-22 DIAGNOSIS — I10 Essential (primary) hypertension: Secondary | ICD-10-CM | POA: Diagnosis not present

## 2020-06-22 DIAGNOSIS — Z811 Family history of alcohol abuse and dependence: Secondary | ICD-10-CM | POA: Diagnosis not present

## 2020-06-22 DIAGNOSIS — T82838A Hemorrhage of vascular prosthetic devices, implants and grafts, initial encounter: Secondary | ICD-10-CM | POA: Diagnosis not present

## 2020-06-22 DIAGNOSIS — Z20822 Contact with and (suspected) exposure to covid-19: Secondary | ICD-10-CM | POA: Diagnosis present

## 2020-06-22 DIAGNOSIS — Z8041 Family history of malignant neoplasm of ovary: Secondary | ICD-10-CM | POA: Diagnosis not present

## 2020-06-22 DIAGNOSIS — I723 Aneurysm of iliac artery: Secondary | ICD-10-CM | POA: Diagnosis present

## 2020-06-22 DIAGNOSIS — R609 Edema, unspecified: Secondary | ICD-10-CM | POA: Diagnosis not present

## 2020-06-22 DIAGNOSIS — Z7901 Long term (current) use of anticoagulants: Secondary | ICD-10-CM | POA: Diagnosis not present

## 2020-06-22 DIAGNOSIS — I959 Hypotension, unspecified: Secondary | ICD-10-CM | POA: Diagnosis not present

## 2020-06-22 DIAGNOSIS — Y838 Other surgical procedures as the cause of abnormal reaction of the patient, or of later complication, without mention of misadventure at the time of the procedure: Secondary | ICD-10-CM | POA: Diagnosis present

## 2020-06-22 DIAGNOSIS — Z01812 Encounter for preprocedural laboratory examination: Secondary | ICD-10-CM | POA: Insufficient documentation

## 2020-06-22 DIAGNOSIS — I350 Nonrheumatic aortic (valve) stenosis: Secondary | ICD-10-CM | POA: Diagnosis present

## 2020-06-22 DIAGNOSIS — I724 Aneurysm of artery of lower extremity: Secondary | ICD-10-CM | POA: Diagnosis not present

## 2020-06-22 DIAGNOSIS — Z79899 Other long term (current) drug therapy: Secondary | ICD-10-CM | POA: Diagnosis not present

## 2020-06-22 DIAGNOSIS — I97621 Postprocedural hematoma of a circulatory system organ or structure following other procedure: Secondary | ICD-10-CM | POA: Diagnosis present

## 2020-06-22 DIAGNOSIS — I97630 Postprocedural hematoma of a circulatory system organ or structure following a cardiac catheterization: Secondary | ICD-10-CM | POA: Diagnosis not present

## 2020-06-22 DIAGNOSIS — Z888 Allergy status to other drugs, medicaments and biological substances status: Secondary | ICD-10-CM | POA: Diagnosis not present

## 2020-06-22 DIAGNOSIS — F17210 Nicotine dependence, cigarettes, uncomplicated: Secondary | ICD-10-CM | POA: Diagnosis present

## 2020-06-22 DIAGNOSIS — I480 Paroxysmal atrial fibrillation: Secondary | ICD-10-CM | POA: Diagnosis present

## 2020-06-22 LAB — SARS CORONAVIRUS 2 (TAT 6-24 HRS): SARS Coronavirus 2: NEGATIVE

## 2020-06-24 ENCOUNTER — Ambulatory Visit (HOSPITAL_COMMUNITY)
Admission: RE | Admit: 2020-06-24 | Discharge: 2020-06-24 | Disposition: A | Discharge status: Home / Self Care | Payer: Medicare HMO | Source: Home / Self Care | Attending: Vascular Surgery | Admitting: Vascular Surgery

## 2020-06-24 ENCOUNTER — Other Ambulatory Visit: Payer: Self-pay | Admitting: *Deleted

## 2020-06-24 ENCOUNTER — Encounter (HOSPITAL_COMMUNITY)
Admission: RE | Disposition: A | Discharge status: Home / Self Care | Payer: Self-pay | Source: Home / Self Care | Attending: Vascular Surgery

## 2020-06-24 ENCOUNTER — Other Ambulatory Visit: Payer: Self-pay

## 2020-06-24 DIAGNOSIS — I4891 Unspecified atrial fibrillation: Secondary | ICD-10-CM | POA: Insufficient documentation

## 2020-06-24 DIAGNOSIS — I714 Abdominal aortic aneurysm, without rupture, unspecified: Secondary | ICD-10-CM

## 2020-06-24 DIAGNOSIS — I723 Aneurysm of iliac artery: Secondary | ICD-10-CM | POA: Insufficient documentation

## 2020-06-24 DIAGNOSIS — F17210 Nicotine dependence, cigarettes, uncomplicated: Secondary | ICD-10-CM | POA: Insufficient documentation

## 2020-06-24 DIAGNOSIS — Z7901 Long term (current) use of anticoagulants: Secondary | ICD-10-CM | POA: Insufficient documentation

## 2020-06-24 DIAGNOSIS — I724 Aneurysm of artery of lower extremity: Secondary | ICD-10-CM | POA: Diagnosis not present

## 2020-06-24 HISTORY — PX: ABDOMINAL AORTOGRAM W/LOWER EXTREMITY: CATH118223

## 2020-06-24 HISTORY — PX: EMBOLIZATION: CATH118239

## 2020-06-24 LAB — CBC
HCT: 48.1 % (ref 39.0–52.0)
Hemoglobin: 16 g/dL (ref 13.0–17.0)
MCH: 32.1 pg (ref 26.0–34.0)
MCHC: 33.3 g/dL (ref 30.0–36.0)
MCV: 96.6 fL (ref 80.0–100.0)
Platelets: 153 10*3/uL (ref 150–400)
RBC: 4.98 MIL/uL (ref 4.22–5.81)
RDW: 13 % (ref 11.5–15.5)
WBC: 8 10*3/uL (ref 4.0–10.5)
nRBC: 0 % (ref 0.0–0.2)

## 2020-06-24 LAB — BASIC METABOLIC PANEL
Anion gap: 7 (ref 5–15)
BUN: 11 mg/dL (ref 8–23)
CO2: 26 mmol/L (ref 22–32)
Calcium: 8.5 mg/dL — ABNORMAL LOW (ref 8.9–10.3)
Chloride: 106 mmol/L (ref 98–111)
Creatinine, Ser: 0.92 mg/dL (ref 0.61–1.24)
GFR, Estimated: >60 mL/min (ref >60)
Glucose, Bld: 101 mg/dL — ABNORMAL HIGH (ref 70–99)
Potassium: 4.2 mmol/L (ref 3.5–5.1)
Sodium: 139 mmol/L (ref 135–145)

## 2020-06-24 SURGERY — EMBOLIZATION
Anesthesia: LOCAL | Laterality: Right

## 2020-06-24 MED ORDER — ACETAMINOPHEN 325 MG PO TABS: 650.0000 mg | ORAL_TABLET | ORAL | Status: DC | PRN

## 2020-06-24 MED ORDER — LIDOCAINE HCL (PF) 1 % IJ SOLN
INTRAMUSCULAR | Status: AC
  Filled 2020-06-24: qty 30

## 2020-06-24 MED ORDER — SODIUM CHLORIDE 0.9 % IV SOLN: INTRAVENOUS | Status: DC

## 2020-06-24 MED ORDER — MORPHINE SULFATE (PF) 2 MG/ML IV SOLN: 2.0000 mg | INTRAVENOUS | Status: DC | PRN

## 2020-06-24 MED ORDER — SODIUM CHLORIDE 0.9% FLUSH: 3.0000 mL | INTRAVENOUS | Status: DC | PRN

## 2020-06-24 MED ORDER — ONDANSETRON HCL 4 MG/2ML IJ SOLN: 4.0000 mg | Freq: Four times a day (QID) | INTRAMUSCULAR | Status: DC | PRN

## 2020-06-24 MED ORDER — OXYCODONE HCL 5 MG PO TABS: 5.0000 mg | ORAL_TABLET | ORAL | Status: DC | PRN

## 2020-06-24 MED ORDER — HEPARIN (PORCINE) IN NACL 1000-0.9 UT/500ML-% IV SOLN
INTRAVENOUS | Status: DC | PRN
  Administered 2020-06-24 (×2): 500 mL

## 2020-06-24 MED ORDER — SODIUM CHLORIDE 0.9% FLUSH: 3.0000 mL | Freq: Two times a day (BID) | INTRAVENOUS | Status: DC

## 2020-06-24 MED ORDER — LIDOCAINE HCL (PF) 1 % IJ SOLN
INTRAMUSCULAR | Status: DC | PRN
  Administered 2020-06-24: 18 mL

## 2020-06-24 MED ORDER — IODIXANOL 320 MG/ML IV SOLN
INTRAVENOUS | Status: DC | PRN
  Administered 2020-06-24: 3 mL

## 2020-06-24 MED ORDER — SODIUM CHLORIDE 0.9 % IV SOLN: 250.0000 mL | INTRAVENOUS | Status: DC | PRN

## 2020-06-24 MED ORDER — LABETALOL HCL 5 MG/ML IV SOLN: 10.0000 mg | INTRAVENOUS | Status: DC | PRN

## 2020-06-24 MED ORDER — HYDRALAZINE HCL 20 MG/ML IJ SOLN: 5.0000 mg | INTRAMUSCULAR | Status: DC | PRN

## 2020-06-24 MED ORDER — HEPARIN (PORCINE) IN NACL 1000-0.9 UT/500ML-% IV SOLN
INTRAVENOUS | Status: AC
  Filled 2020-06-24: qty 1000

## 2020-06-24 SURGICAL SUPPLY — 17 items
CATH ANGIO 5F BER2 65CM (CATHETERS) ×3 IMPLANT
CATH ANGIO 5F PIGTAIL 65CM (CATHETERS) ×3 IMPLANT
CATH CROSS OVER TEMPO 5F (CATHETERS) ×3 IMPLANT
CATH STRAIGHT 5FR 65CM (CATHETERS) ×3 IMPLANT
COIL EMBL 14X103.2 LOOP (Embolic) ×24 IMPLANT
COIL NESTER 14X10 (Embolic) ×36 IMPLANT
COIL NESTER 14X12 (Embolic) ×21 IMPLANT
COIL NESTER 14X8 (Embolic) ×27 IMPLANT
GUIDEWIRE ANGLED .035X150CM (WIRE) ×3 IMPLANT
KIT PV (KITS) ×3 IMPLANT
SHEATH PINNACLE 6F 10CM (SHEATH) ×3 IMPLANT
SHEATH PINNACLE 7F 10CM (SHEATH) ×3 IMPLANT
SHEATH PROBE COVER 6X72 (BAG) ×3 IMPLANT
TRANSDUCER W/STOPCOCK (MISCELLANEOUS) ×3 IMPLANT
TRAY PV CATH (CUSTOM PROCEDURE TRAY) ×3 IMPLANT
WIRE BENTSON .035X145CM (WIRE) ×3 IMPLANT
WIRE HITORQ VERSACORE ST 145CM (WIRE) ×3 IMPLANT

## 2020-06-24 NOTE — Discharge Instructions (Signed)
MAY RESUME [place]; STOP XARELTO 3 DAYS BEFORE STENT GRAFT    Femoral Site Care This sheet gives you information about how to care for yourself after your procedure. Your health care provider may also give you more specific instructions. If you have problems or questions, contact your health care provider. What can I expect after the procedure? After the procedure, it is common to have:  Bruising that usually fades within 1-2 weeks.  Tenderness at the site. Follow these instructions at home: Wound care  Follow instructions from your health care provider about how to take care of your insertion site. Make sure you: ? Wash your hands with soap and water before you change your bandage (dressing). If soap and water are not available, use hand sanitizer. ? Change your dressing as told by your health care provider. ? Leave stitches (sutures), skin glue, or adhesive strips in place. These skin closures may need to stay in place for 2 weeks or longer. If adhesive strip edges start to loosen and curl up, you may trim the loose edges. Do not remove adhesive strips completely unless your health care provider tells you to do that.  Do not take baths, swim, or use a hot tub until your health care provider approves.  You may shower 24-48 hours after the procedure or as told by your health care provider. ? Gently wash the site with plain soap and water. ? Pat the area dry with a clean towel. ? Do not rub the site. This may cause bleeding.  Do not apply powder or lotion to the site. Keep the site clean and dry.  Check your femoral site every day for signs of infection. Check for: ? Redness, swelling, or pain. ? Fluid or blood. ? Warmth. ? Pus or a bad smell. Activity  For the first 2-3 days after your procedure, or as long as directed: ? Avoid climbing stairs as much as possible. ? Do not squat.  Do not lift anything that is heavier than 10 lb (4.5 kg), or the limit that you are  told, until your health care provider says that it is safe.  Rest as directed. ? Avoid sitting for a long time without moving. Get up to take short walks every 1-2 hours.  Do not drive for 24 hours if you were given a medicine to help you relax (sedative). General instructions  Take over-the-counter and prescription medicines only as told by your health care provider.  Keep all follow-up visits as told by your health care provider. This is important. Contact a health care provider if you have:  A fever or chills.  You have redness, swelling, or pain around your insertion site. Get help right away if:  The catheter insertion area swells very fast.  You pass out.  You suddenly start to sweat or your skin gets clammy.  The catheter insertion area is bleeding, and the bleeding does not stop when you hold steady pressure on the area.  The area near or just beyond the catheter insertion site becomes pale, cool, tingly, or numb. These symptoms may represent a serious problem that is an emergency. Do not wait to see if the symptoms will go away. Get medical help right away. Call your local emergency services (911 in the [place] Do not drive yourself to the hospital. Summary  After the procedure, it is common to have bruising that usually fades within 1-2 weeks.  Check your femoral site every day for signs of infection.  Do not lift anything that is heavier than 10 lb (4.5 kg), or the limit that you are told, until your health care provider says that it is safe. This information is not intended to replace advice given to you by your health care provider. Make sure you discuss any questions you have with your health care provider. Document Revised: 08/05/2017 Document Reviewed: 08/05/2017 Elsevier Patient Education  2020 [employer].

## 2020-06-24 NOTE — Progress Notes (Signed)
Called Dr Oneida Alar to ask about client resuming his xarelto and per Dr Oneida Alar client may resume xarelto tomorrow and advise client to stop xarelto 3 days before stent graft

## 2020-06-24 NOTE — Op Note (Addendum)
Procedure: Ultrasound right groin, first order catheterization right internal iliac artery, coil embolization of right internal iliac artery aneurysm  Preoperative diagnosis: Right internal iliac artery aneurysm  Postoperative diagnosis: Same  Anesthesia: Local  Operative findings: Coil embolization right internal iliac artery Nester coils 27 and a combination of 810 and 12's  Operative details: After team informed consent, patient was taken the [place] lab.  The patient was placed in supine position angio table.  Both groins were prepped and draped in usual sterile fashion.  Local anesthesia was infiltrated over the right common femoral artery.  Introducer needle was used to cannulate right common femoral artery under ultrasound guidance.  Care was taken to stick above the femoral bifurcation and below the inguinal ligament.  3 5 versa core wire was threaded up in the abdominal aorta and fluoroscopic guidance.  Next a 5 French sheath placed over guidewire in the right common femoral artery.  This was thoroughly flushed heparinized saline.  Contrast angiogram was then obtained retrograde through the right femoral sheath.  This was to determine the iliac bifurcation level.  This was marked for roadmapping.  Next an 035 90 degree Berenstein catheter was brought up on the field and advanced over the wire to selectively catheterize the right internal iliac artery.  I was able to get an 035 angled Glidewire to engage into the internal iliac artery on several occasions but could not get enough purchase to advance the catheter and sheath over this.  Therefore the 5 French sheath was swapped out over guidewire for a 6 and half [place] Oscor articulating sheath.  This was advanced up just above the iliac bifurcation and then articulated did engage the right internal iliac artery.  I was then able to advance an 035 angled Glidewire deep into the secondary branches of the internal iliac artery and then advance a 5 French  straight catheter over this.  I proceeded at this point to coil embolize the right internal iliac artery using a combination of 1210 and 8 Nester coils.  We filled this all the way up to within about a centimeter of the origin of the internal iliac artery.  Contrast angiogram showed minimal filling of the aneurysm and no filling of the distal branches at this portion of the case.  The Oscor sheath was straightened under fluoroscopy and removed and swapped out for a 7 [place] short sheath.  This was thoroughly flushed heparinized saline.  The patient tired procedure well and there were no complications.  Patient taken the holding area in stable condition.  Sheath will be removed when ACT is less than 175.  [name], MD [facility] [place] Office: [phone]

## 2020-06-24 NOTE — Interval H&P Note (Signed)
History and Physical Interval Note:  06/24/2020 1:15 PM  Cory [name]  has presented today for surgery, with the diagnosis of Iliac artery aneurysm, claudication.  The various methods of treatment have been discussed with the patient and family. After consideration of risks, benefits and other options for treatment, the patient has consented to  Procedure(s): EMBOLIZATION (Right) ABDOMINAL AORTOGRAM W/LOWER EXTREMITY (Bilateral) as a surgical intervention.  The patient's history has been reviewed, patient examined, no change in status, stable for surgery.  I have reviewed the patient's chart and labs.  Questions were answered to the patient's satisfaction.     [name]

## 2020-06-24 NOTE — Progress Notes (Signed)
61fr sheath removed from right femoral artery at 1523 hrs using manual pressure, held until hemostasis achieved at 1550 hrs.  Tegaderm and gauze dressing applied.  [facility] specialist RT-(R)

## 2020-06-25 ENCOUNTER — Encounter (HOSPITAL_COMMUNITY)
Admission: EM | Disposition: A | Discharge status: Home / Self Care | Payer: Self-pay | Source: Home / Self Care | Attending: Vascular Surgery

## 2020-06-25 ENCOUNTER — Inpatient Hospital Stay (HOSPITAL_COMMUNITY)
Admission: EM | Admit: 2020-06-25 | Discharge: 2020-06-28 | DRG: 271 | Disposition: A | Discharge status: Home / Self Care | Payer: Medicare HMO | Attending: Vascular Surgery | Admitting: Vascular Surgery

## 2020-06-25 ENCOUNTER — Emergency Department (HOSPITAL_COMMUNITY): Payer: Medicare HMO | Admitting: Certified Registered Nurse Anesthetist

## 2020-06-25 ENCOUNTER — Encounter (HOSPITAL_COMMUNITY): Payer: Self-pay

## 2020-06-25 ENCOUNTER — Other Ambulatory Visit: Payer: Self-pay

## 2020-06-25 DIAGNOSIS — T82838A Hemorrhage of vascular prosthetic devices, implants and grafts, initial encounter: Secondary | ICD-10-CM | POA: Diagnosis not present

## 2020-06-25 DIAGNOSIS — Z79899 Other long term (current) drug therapy: Secondary | ICD-10-CM

## 2020-06-25 DIAGNOSIS — F17210 Nicotine dependence, cigarettes, uncomplicated: Secondary | ICD-10-CM | POA: Diagnosis present

## 2020-06-25 DIAGNOSIS — I350 Nonrheumatic aortic (valve) stenosis: Secondary | ICD-10-CM | POA: Diagnosis present

## 2020-06-25 DIAGNOSIS — Z888 Allergy status to other drugs, medicaments and biological substances status: Secondary | ICD-10-CM

## 2020-06-25 DIAGNOSIS — I97638 Postprocedural hematoma of a circulatory system organ or structure following other circulatory system procedure: Secondary | ICD-10-CM | POA: Diagnosis not present

## 2020-06-25 DIAGNOSIS — I480 Paroxysmal atrial fibrillation: Secondary | ICD-10-CM | POA: Diagnosis present

## 2020-06-25 DIAGNOSIS — R58 Hemorrhage, not elsewhere classified: Secondary | ICD-10-CM | POA: Diagnosis not present

## 2020-06-25 DIAGNOSIS — Z8041 Family history of malignant neoplasm of ovary: Secondary | ICD-10-CM | POA: Diagnosis not present

## 2020-06-25 DIAGNOSIS — I959 Hypotension, unspecified: Secondary | ICD-10-CM | POA: Diagnosis not present

## 2020-06-25 DIAGNOSIS — S7011XA Contusion of right thigh, initial encounter: Secondary | ICD-10-CM

## 2020-06-25 DIAGNOSIS — R52 Pain, unspecified: Secondary | ICD-10-CM | POA: Diagnosis not present

## 2020-06-25 DIAGNOSIS — I739 Peripheral vascular disease, unspecified: Secondary | ICD-10-CM

## 2020-06-25 DIAGNOSIS — Y838 Other surgical procedures as the cause of abnormal reaction of the patient, or of later complication, without mention of misadventure at the time of the procedure: Secondary | ICD-10-CM | POA: Diagnosis present

## 2020-06-25 DIAGNOSIS — Z20822 Contact with and (suspected) exposure to covid-19: Secondary | ICD-10-CM | POA: Diagnosis present

## 2020-06-25 DIAGNOSIS — D62 Acute posthemorrhagic anemia: Secondary | ICD-10-CM | POA: Diagnosis not present

## 2020-06-25 DIAGNOSIS — Z803 Family history of malignant neoplasm of breast: Secondary | ICD-10-CM

## 2020-06-25 DIAGNOSIS — Z811 Family history of alcohol abuse and dependence: Secondary | ICD-10-CM

## 2020-06-25 DIAGNOSIS — Z881 Allergy status to other antibiotic agents status: Secondary | ICD-10-CM | POA: Diagnosis not present

## 2020-06-25 DIAGNOSIS — Z23 Encounter for immunization: Secondary | ICD-10-CM

## 2020-06-25 DIAGNOSIS — Z7901 Long term (current) use of anticoagulants: Secondary | ICD-10-CM | POA: Diagnosis not present

## 2020-06-25 DIAGNOSIS — R609 Edema, unspecified: Secondary | ICD-10-CM | POA: Diagnosis not present

## 2020-06-25 DIAGNOSIS — I97621 Postprocedural hematoma of a circulatory system organ or structure following other procedure: Secondary | ICD-10-CM | POA: Diagnosis present

## 2020-06-25 DIAGNOSIS — I723 Aneurysm of iliac artery: Principal | ICD-10-CM | POA: Diagnosis present

## 2020-06-25 DIAGNOSIS — I97630 Postprocedural hematoma of a circulatory system organ or structure following a cardiac catheterization: Secondary | ICD-10-CM | POA: Diagnosis not present

## 2020-06-25 DIAGNOSIS — I10 Essential (primary) hypertension: Secondary | ICD-10-CM | POA: Diagnosis not present

## 2020-06-25 HISTORY — PX: FEMORAL ARTERY EXPLORATION: SHX5160

## 2020-06-25 LAB — I-STAT CHEM 8, ED
BUN: 10 mg/dL (ref 8–23)
Calcium, Ion: 1.13 mmol/L — ABNORMAL LOW (ref 1.15–1.40)
Chloride: 104 mmol/L (ref 98–111)
Creatinine, Ser: 0.8 mg/dL (ref 0.61–1.24)
Glucose, Bld: 107 mg/dL — ABNORMAL HIGH (ref 70–99)
HCT: 33 % — ABNORMAL LOW (ref 39.0–52.0)
Hemoglobin: 11.2 g/dL — ABNORMAL LOW (ref 13.0–17.0)
Potassium: 3.6 mmol/L (ref 3.5–5.1)
Sodium: 139 mmol/L (ref 135–145)
TCO2: 25 mmol/L (ref 22–32)

## 2020-06-25 LAB — ABO/RH: ABO/RH(D): A POS

## 2020-06-25 LAB — PREPARE RBC (CROSSMATCH)

## 2020-06-25 LAB — CBC
HCT: 39.2 % (ref 39.0–52.0)
Hemoglobin: 12.7 g/dL — ABNORMAL LOW (ref 13.0–17.0)
MCH: 32.6 pg (ref 26.0–34.0)
MCHC: 32.4 g/dL (ref 30.0–36.0)
MCV: 100.8 fL — ABNORMAL HIGH (ref 80.0–100.0)
Platelets: 163 10*3/uL (ref 150–400)
RBC: 3.89 MIL/uL — ABNORMAL LOW (ref 4.22–5.81)
RDW: 13.1 % (ref 11.5–15.5)
WBC: 12 10*3/uL — ABNORMAL HIGH (ref 4.0–10.5)
nRBC: 0 % (ref 0.0–0.2)

## 2020-06-25 LAB — BASIC METABOLIC PANEL
Anion gap: 9 (ref 5–15)
BUN: 9 mg/dL (ref 8–23)
CO2: 25 mmol/L (ref 22–32)
Calcium: 8.1 mg/dL — ABNORMAL LOW (ref 8.9–10.3)
Chloride: 103 mmol/L (ref 98–111)
Creatinine, Ser: 1.09 mg/dL (ref 0.61–1.24)
GFR, Estimated: >60 mL/min (ref >60)
Glucose, Bld: 127 mg/dL — ABNORMAL HIGH (ref 70–99)
Potassium: 3.4 mmol/L — ABNORMAL LOW (ref 3.5–5.1)
Sodium: 137 mmol/L (ref 135–145)

## 2020-06-25 LAB — PROTIME-INR
INR: 1.1 (ref 0.8–1.2)
Prothrombin Time: 14.1 seconds (ref 11.4–15.2)

## 2020-06-25 LAB — APTT: aPTT: 29 seconds (ref 24–36)

## 2020-06-25 LAB — RESPIRATORY PANEL BY RT PCR (FLU A&B, COVID)
Influenza A by PCR: NEGATIVE
Influenza B by PCR: NEGATIVE
SARS Coronavirus 2 by RT PCR: NEGATIVE

## 2020-06-25 SURGERY — EXPLORATION, ARTERY, FEMORAL
Anesthesia: General | Site: Groin | Laterality: Right

## 2020-06-25 MED ORDER — ACETAMINOPHEN 325 MG PO TABS
325.0000 mg | ORAL_TABLET | ORAL | Status: DC | PRN
  Administered 2020-06-27 (×2): 650 mg via ORAL
  Filled 2020-06-25 (×2): qty 2

## 2020-06-25 MED ORDER — DOCUSATE SODIUM 100 MG PO CAPS
100.0000 mg | ORAL_CAPSULE | Freq: Every day | ORAL | Status: DC
  Administered 2020-06-26 – 2020-06-28 (×3): 100 mg via ORAL
  Filled 2020-06-25 (×3): qty 1

## 2020-06-25 MED ORDER — METOPROLOL TARTRATE 25 MG PO TABS: 25.0000 mg | ORAL_TABLET | Freq: Two times a day (BID) | ORAL | Status: DC | PRN

## 2020-06-25 MED ORDER — DEXAMETHASONE SODIUM PHOSPHATE 10 MG/ML IJ SOLN
INTRAMUSCULAR | Status: DC | PRN
  Administered 2020-06-25: 4 mg via INTRAVENOUS

## 2020-06-25 MED ORDER — METOPROLOL TARTRATE 5 MG/5ML IV SOLN: 2.0000 mg | INTRAVENOUS | Status: DC | PRN

## 2020-06-25 MED ORDER — FENTANYL CITRATE (PF) 100 MCG/2ML IJ SOLN: 25.0000 ug | INTRAMUSCULAR | Status: DC | PRN

## 2020-06-25 MED ORDER — OXYCODONE HCL 5 MG PO TABS: 5.0000 mg | ORAL_TABLET | Freq: Once | ORAL | Status: DC | PRN

## 2020-06-25 MED ORDER — ONDANSETRON HCL 4 MG/2ML IJ SOLN: 4.0000 mg | Freq: Four times a day (QID) | INTRAMUSCULAR | Status: DC | PRN

## 2020-06-25 MED ORDER — OXYCODONE-ACETAMINOPHEN 5-325 MG PO TABS
1.0000 | ORAL_TABLET | ORAL | Status: DC | PRN
  Administered 2020-06-25: 2 via ORAL
  Filled 2020-06-25: qty 2

## 2020-06-25 MED ORDER — SODIUM CHLORIDE 0.9 % IV SOLN: INTRAVENOUS | Status: DC

## 2020-06-25 MED ORDER — SODIUM CHLORIDE 0.9 % IV SOLN: 500.0000 mL | Freq: Once | INTRAVENOUS | Status: DC | PRN

## 2020-06-25 MED ORDER — PROTAMINE SULFATE 10 MG/ML IV SOLN
INTRAVENOUS | Status: DC | PRN
  Administered 2020-06-25: 40 mg via INTRAVENOUS

## 2020-06-25 MED ORDER — ALUM & MAG HYDROXIDE-SIMETH 200-200-20 MG/5ML PO SUSP: 15.0000 mL | ORAL | Status: DC | PRN

## 2020-06-25 MED ORDER — ROCURONIUM BROMIDE 10 MG/ML (PF) SYRINGE
PREFILLED_SYRINGE | INTRAVENOUS | Status: DC | PRN
  Administered 2020-06-25: 20 mg via INTRAVENOUS
  Administered 2020-06-25: 40 mg via INTRAVENOUS

## 2020-06-25 MED ORDER — FENTANYL CITRATE (PF) 250 MCG/5ML IJ SOLN
INTRAMUSCULAR | Status: AC
  Filled 2020-06-25: qty 5

## 2020-06-25 MED ORDER — MAGNESIUM SULFATE 2 GM/50ML IV SOLN: 2.0000 g | Freq: Every day | INTRAVENOUS | Status: DC | PRN

## 2020-06-25 MED ORDER — POLYETHYLENE GLYCOL 3350 17 G PO PACK: 17.0000 g | PACK | Freq: Every day | ORAL | Status: DC | PRN

## 2020-06-25 MED ORDER — ETOMIDATE 2 MG/ML IV SOLN
INTRAVENOUS | Status: DC | PRN
  Administered 2020-06-25: 20 mg via INTRAVENOUS

## 2020-06-25 MED ORDER — FENTANYL CITRATE (PF) 250 MCG/5ML IJ SOLN
INTRAMUSCULAR | Status: DC | PRN
  Administered 2020-06-25: 100 ug via INTRAVENOUS
  Administered 2020-06-25 (×2): 50 ug via INTRAVENOUS

## 2020-06-25 MED ORDER — POTASSIUM CHLORIDE CRYS ER 20 MEQ PO TBCR: 20.0000 meq | EXTENDED_RELEASE_TABLET | Freq: Every day | ORAL | Status: DC | PRN

## 2020-06-25 MED ORDER — LACTATED RINGERS IV SOLN: INTRAVENOUS | Status: DC | PRN

## 2020-06-25 MED ORDER — ACETAMINOPHEN 650 MG RE SUPP: 325.0000 mg | RECTAL | Status: DC | PRN

## 2020-06-25 MED ORDER — THROMBIN (RECOMBINANT) 20000 UNITS EX SOLR
CUTANEOUS | Status: AC
  Filled 2020-06-25: qty 20000

## 2020-06-25 MED ORDER — OXYCODONE HCL 5 MG/5ML PO SOLN: 5.0000 mg | Freq: Once | ORAL | Status: DC | PRN

## 2020-06-25 MED ORDER — GUAIFENESIN-DM 100-10 MG/5ML PO SYRP
15.0000 mL | ORAL_SOLUTION | ORAL | Status: DC | PRN
  Administered 2020-06-25: 15 mL via ORAL
  Filled 2020-06-25: qty 15

## 2020-06-25 MED ORDER — SODIUM CHLORIDE 0.9 % IV SOLN
INTRAVENOUS | Status: AC
  Filled 2020-06-25: qty 1.2

## 2020-06-25 MED ORDER — SODIUM CHLORIDE 0.9 % IV SOLN: Freq: Once | INTRAVENOUS | Status: AC

## 2020-06-25 MED ORDER — PANTOPRAZOLE SODIUM 40 MG PO TBEC
40.0000 mg | DELAYED_RELEASE_TABLET | Freq: Every day | ORAL | Status: DC
  Administered 2020-06-25 – 2020-06-28 (×4): 40 mg via ORAL
  Filled 2020-06-25 (×4): qty 1

## 2020-06-25 MED ORDER — PHENOL 1.4 % MT LIQD: 1.0000 | OROMUCOSAL | Status: DC | PRN

## 2020-06-25 MED ORDER — LORATADINE 10 MG PO TABS
10.0000 mg | ORAL_TABLET | Freq: Every day | ORAL | Status: DC
  Administered 2020-06-26 – 2020-06-28 (×3): 10 mg via ORAL
  Filled 2020-06-25 (×3): qty 1

## 2020-06-25 MED ORDER — VITAMIN D 25 MCG (1000 UNIT) PO TABS
1000.0000 [IU] | ORAL_TABLET | Freq: Every day | ORAL | Status: DC
  Administered 2020-06-26 – 2020-06-28 (×3): 1000 [IU] via ORAL
  Filled 2020-06-25 (×4): qty 1

## 2020-06-25 MED ORDER — LABETALOL HCL 5 MG/ML IV SOLN: 10.0000 mg | INTRAVENOUS | Status: DC | PRN

## 2020-06-25 MED ORDER — 0.9 % SODIUM CHLORIDE (POUR BTL) OPTIME
TOPICAL | Status: DC | PRN
  Administered 2020-06-25: 1000 mL

## 2020-06-25 MED ORDER — HEPARIN SODIUM (PORCINE) 1000 UNIT/ML IJ SOLN
INTRAMUSCULAR | Status: DC | PRN
  Administered 2020-06-25: 8000 [IU] via INTRAVENOUS

## 2020-06-25 MED ORDER — CEFAZOLIN SODIUM-DEXTROSE 2-4 GM/100ML-% IV SOLN
2.0000 g | INTRAVENOUS | Status: AC
  Administered 2020-06-25: 2 g via INTRAVENOUS
  Filled 2020-06-25: qty 100

## 2020-06-25 MED ORDER — FLUTICASONE PROPIONATE 50 MCG/ACT NA SUSP
2.0000 | Freq: Every day | NASAL | Status: DC | PRN
  Filled 2020-06-25: qty 16

## 2020-06-25 MED ORDER — SODIUM CHLORIDE 0.9 % IV SOLN
INTRAVENOUS | Status: DC | PRN
  Administered 2020-06-25: 500 mL

## 2020-06-25 MED ORDER — SUCCINYLCHOLINE CHLORIDE 200 MG/10ML IV SOSY
PREFILLED_SYRINGE | INTRAVENOUS | Status: DC | PRN
  Administered 2020-06-25: 140 mg via INTRAVENOUS

## 2020-06-25 MED ORDER — BISACODYL 10 MG RE SUPP: 10.0000 mg | Freq: Every day | RECTAL | Status: DC | PRN

## 2020-06-25 MED ORDER — MORPHINE SULFATE (PF) 2 MG/ML IV SOLN: 2.0000 mg | INTRAVENOUS | Status: DC | PRN

## 2020-06-25 MED ORDER — DILTIAZEM HCL ER COATED BEADS 120 MG PO CP24
120.0000 mg | ORAL_CAPSULE | Freq: Every day | ORAL | Status: DC
  Administered 2020-06-26 – 2020-06-28 (×3): 120 mg via ORAL
  Filled 2020-06-25 (×3): qty 1

## 2020-06-25 MED ORDER — ROSUVASTATIN CALCIUM 5 MG PO TABS
10.0000 mg | ORAL_TABLET | Freq: Every evening | ORAL | Status: DC
  Administered 2020-06-26: 10 mg via ORAL
  Filled 2020-06-25: qty 2

## 2020-06-25 MED ORDER — HYDRALAZINE HCL 20 MG/ML IJ SOLN: 5.0000 mg | INTRAMUSCULAR | Status: DC | PRN

## 2020-06-25 MED ORDER — PROPOFOL 10 MG/ML IV BOLUS
INTRAVENOUS | Status: DC | PRN
  Administered 2020-06-25: 40 mg via INTRAVENOUS

## 2020-06-25 MED ORDER — CEFAZOLIN SODIUM-DEXTROSE 2-4 GM/100ML-% IV SOLN
2.0000 g | Freq: Three times a day (TID) | INTRAVENOUS | Status: AC
  Administered 2020-06-25 – 2020-06-26 (×2): 2 g via INTRAVENOUS
  Filled 2020-06-25 (×2): qty 100

## 2020-06-25 MED ORDER — SODIUM CHLORIDE 0.9% IV SOLUTION: Freq: Once | INTRAVENOUS | Status: DC

## 2020-06-25 MED ORDER — PHENYLEPHRINE 40 MCG/ML (10ML) SYRINGE FOR IV PUSH (FOR BLOOD PRESSURE SUPPORT)
PREFILLED_SYRINGE | INTRAVENOUS | Status: DC | PRN
  Administered 2020-06-25 (×3): 40 ug via INTRAVENOUS

## 2020-06-25 MED ORDER — PAPAVERINE HCL 30 MG/ML IJ SOLN
INTRAMUSCULAR | Status: AC
  Filled 2020-06-25: qty 2

## 2020-06-25 MED ORDER — ONDANSETRON HCL 4 MG/2ML IJ SOLN
INTRAMUSCULAR | Status: DC | PRN
  Administered 2020-06-25: 4 mg via INTRAVENOUS

## 2020-06-25 MED ORDER — PHENYLEPHRINE HCL-NACL 10-0.9 MG/250ML-% IV SOLN
INTRAVENOUS | Status: DC | PRN
  Administered 2020-06-25: 15 ug/min via INTRAVENOUS

## 2020-06-25 SURGICAL SUPPLY — 62 items
BANDAGE ESMARK 6X9 LF (GAUZE/BANDAGES/DRESSINGS) IMPLANT
BNDG ELASTIC 4X5.8 VLCR STR LF (GAUZE/BANDAGES/DRESSINGS) IMPLANT
BNDG ESMARK 6X9 LF (GAUZE/BANDAGES/DRESSINGS)
CANISTER SUCT 3000ML PPV (MISCELLANEOUS) ×2 IMPLANT
CANNULA VESSEL 3MM 2 BLNT TIP (CANNULA) ×4 IMPLANT
CLIP VESOCCLUDE MED 24/CT (CLIP) ×4 IMPLANT
CLIP VESOCCLUDE SM WIDE 24/CT (CLIP) ×4 IMPLANT
COVER WAND RF STERILE (DRAPES) ×2 IMPLANT
CUFF TOURN SGL QUICK 24 (TOURNIQUET CUFF)
CUFF TOURN SGL QUICK 34 (TOURNIQUET CUFF)
CUFF TOURN SGL QUICK 42 (TOURNIQUET CUFF) IMPLANT
CUFF TRNQT CYL 24X4X16.5-23 (TOURNIQUET CUFF) IMPLANT
CUFF TRNQT CYL 34X4.125X (TOURNIQUET CUFF) IMPLANT
DERMABOND ADVANCED (GAUZE/BANDAGES/DRESSINGS) ×1
DERMABOND ADVANCED .7 DNX12 (GAUZE/BANDAGES/DRESSINGS) ×1 IMPLANT
DRAIN CHANNEL 15F RND FF W/TCR (WOUND CARE) IMPLANT
DRAIN PENROSE 3/4X12 (DRAIN) IMPLANT
DRAPE X-RAY CASS 24X20 (DRAPES) IMPLANT
ELECT REM PT RETURN 9FT ADLT (ELECTROSURGICAL) ×2
ELECTRODE REM PT RTRN 9FT ADLT (ELECTROSURGICAL) ×1 IMPLANT
EVACUATOR SILICONE 100CC (DRAIN) ×2 IMPLANT
GLOVE BIO SURGEON STRL SZ 6.5 (GLOVE) ×8 IMPLANT
GLOVE BIO SURGEON STRL SZ7.5 (GLOVE) ×2 IMPLANT
GLOVE BIOGEL PI IND STRL 6.5 (GLOVE) ×1 IMPLANT
GLOVE BIOGEL PI IND STRL 7.0 (GLOVE) ×1 IMPLANT
GLOVE BIOGEL PI IND STRL 7.5 (GLOVE) ×1 IMPLANT
GLOVE BIOGEL PI IND STRL 8 (GLOVE) ×1 IMPLANT
GLOVE BIOGEL PI INDICATOR 6.5 (GLOVE) ×1
GLOVE BIOGEL PI INDICATOR 7.0 (GLOVE) ×1
GLOVE BIOGEL PI INDICATOR 7.5 (GLOVE) ×1
GLOVE BIOGEL PI INDICATOR 8 (GLOVE) ×1
GOWN STRL REUS W/ TWL LRG LVL3 (GOWN DISPOSABLE) ×3 IMPLANT
GOWN STRL REUS W/TWL LRG LVL3 (GOWN DISPOSABLE) ×6
KIT BASIN OR (CUSTOM PROCEDURE TRAY) ×2 IMPLANT
KIT TURNOVER KIT B (KITS) ×2 IMPLANT
MARKER GRAFT CORONARY BYPASS (MISCELLANEOUS) IMPLANT
NS IRRIG 1000ML POUR BTL (IV SOLUTION) ×4 IMPLANT
PACK PERIPHERAL VASCULAR (CUSTOM PROCEDURE TRAY) ×2 IMPLANT
PAD ARMBOARD 7.5X6 YLW CONV (MISCELLANEOUS) ×4 IMPLANT
SET COLLECT BLD 21X3/4 12 (NEEDLE) IMPLANT
SPONGE GAUZE 2X2 8PLY STRL LF (GAUZE/BANDAGES/DRESSINGS) ×2 IMPLANT
SPONGE INTESTINAL PEANUT (DISPOSABLE) ×2 IMPLANT
SPONGE SURGIFOAM ABS GEL 100 (HEMOSTASIS) IMPLANT
STAPLER VISISTAT (STAPLE) IMPLANT
STOPCOCK 4 WAY LG BORE MALE ST (IV SETS) IMPLANT
SUT ETHILON 3 0 PS 1 (SUTURE) ×2 IMPLANT
SUT PROLENE 5 0 C 1 24 (SUTURE) ×8 IMPLANT
SUT PROLENE 6 0 BV (SUTURE) ×4 IMPLANT
SUT PROLENE 7 0 BV 1 (SUTURE) IMPLANT
SUT SILK 2 0 PERMA HAND 18 BK (SUTURE) ×2 IMPLANT
SUT SILK 3 0 (SUTURE)
SUT SILK 3-0 18XBRD TIE 12 (SUTURE) IMPLANT
SUT VIC AB 2-0 CTB1 (SUTURE) ×6 IMPLANT
SUT VIC AB 3-0 SH 27 (SUTURE) ×4
SUT VIC AB 3-0 SH 27X BRD (SUTURE) ×2 IMPLANT
SUT VICRYL 4-0 PS2 18IN ABS (SUTURE) ×4 IMPLANT
TAPE CLOTH SURG 4X10 WHT LF (GAUZE/BANDAGES/DRESSINGS) ×2 IMPLANT
TOWEL GREEN STERILE (TOWEL DISPOSABLE) ×2 IMPLANT
TRAY FOLEY MTR SLVR 16FR STAT (SET/KITS/TRAYS/PACK) ×2 IMPLANT
TUBING EXTENTION W/L.L. (IV SETS) IMPLANT
UNDERPAD 30X36 HEAVY ABSORB (UNDERPADS AND DIAPERS) ×2 IMPLANT
WATER STERILE IRR 1000ML POUR (IV SOLUTION) ×2 IMPLANT

## 2020-06-25 NOTE — Op Note (Signed)
    NAME: Cory [name]    MRN: [id number] DOB: [birth year]    DATE OF OPERATION: 06/25/2020  PREOP DIAGNOSIS:    Bleeding from right groin secondary to catheterization yesterday  POSTOP DIAGNOSIS:    Same  PROCEDURE:    Evacuation hematoma right groin and repair of right common femoral artery  SURGEON: Judeth Cornfield. Scot Dock, MD  ASSIST: Liana Crocker, PA  ANESTHESIA: General  EBL: Minimal  INDICATIONS:    [name] is a 74 y.o. male who underwent catheterization yesterday via right femoral approach for coil embolization of an internal iliac artery.  The patient went home yesterday.  This morning he heard a pop in his groin and developed a large hematoma.  He was brought by EMS to the hospital.  He was taken to the operating room for repair of the artery.  FINDINGS:   The artery had an anterior stick which was actively bleeding and this was repaired with 5-0 Prolene.  A large hematoma was evacuated.  TECHNIQUE:   The patient was taken to the operating room and received a general anesthetic.  The lower abdomen and groin were prepped and draped in usual sterile fashion.  A longitudinal incision was made in the right groin.  The dissection was carried down to the common femoral artery above the bleeding.  Once the bleeding was encountered digital pressure was maintained.  I was able to then get proximal control of the common femoral, superficial femoral, and deep femoral arteries.  Once this was controlled and the patient had been heparinized the vessels were clamped.  The area was clearly defined and repaired with a 5-0 Prolene.  A large hematoma was evacuated.  Jackson-Pratt drain was placed.  Irrigation was performed and hemostasis obtained.  The wound was closed with a deep layer of 2-0 Vicryl, 2 layers of 3-0 Vicryl, and a skin layer of 4-0 Vicryl.  Patient tolerated procedure well was transferred to recovery room in stable condition.  All needle and sponge counts were  correct.  Given the complexity of the case a first assistant was necessary in order to expedient the procedure and safely perform the technical aspects of the operation.  Deitra Mayo, MD, FACS [facility]  DATE OF DICTATION:   06/25/2020

## 2020-06-25 NOTE — Transfer of Care (Signed)
Immediate Anesthesia Transfer of Care Note  Patient: Cory [name]  Procedure(s) Performed: RIGHT GROIN EXPLORATION Evacuation of Hematoma  WITH REPAIR OF RIGHT Common FEMORAL ARTERY. (Right Groin)  Patient Location: PACU  Anesthesia Type:General  Level of Consciousness: awake, alert  and oriented  Airway & Oxygen Therapy: Patient Spontanous Breathing and Patient connected to nasal cannula oxygen  Post-op Assessment: Report given to RN and Post -op Vital signs reviewed and stable  Post vital signs: Reviewed and stable  Last Vitals:  Vitals Value Taken Time  BP 102/50   Temp    Pulse 81 06/25/20 1416  Resp 18 06/25/20 1416  SpO2 97 % 06/25/20 1416  Vitals shown include unvalidated device data.  Last Pain:  Vitals:   06/25/20 1117  TempSrc:   PainSc: 6          Complications: No complications documented.

## 2020-06-25 NOTE — ED Notes (Signed)
Procedural consent obtained from pt & witnessed by this RN, on paper form at bedside.

## 2020-06-25 NOTE — ED Triage Notes (Signed)
Pt BIB [facility] d/t Rt groin swelling in the same area he had vascular surgery yesterday for an embolization of the ileac artery (per pt). EMS reports pt was sitting at his desk & crossed his legs then felt a sudden sharp pain in his Rt groin then he saw his Rt groin start to swell. EMS arrived & said his groin was actively swelling & the first BP was 90/60 @ 0945, then 80/50 @ 1000, then after 250 ml of NS it was 100/60 @ 1010. Upon arrival to ED pt is A/Ox4, verbal-able to make needs known and his BP was 122/108 after 600 ml of NS.

## 2020-06-25 NOTE — ED Notes (Signed)
Ceftriaxone at bedside ready to be started.

## 2020-06-25 NOTE — Progress Notes (Signed)
Patient arrived to 4E from PACU, pt oriented to unit, call bell within reach, bed in lowest position.

## 2020-06-25 NOTE — ED Notes (Signed)
EDP at bedside  

## 2020-06-25 NOTE — Anesthesia Preprocedure Evaluation (Addendum)
Anesthesia Evaluation  Patient identified by MRN, date of birth, ID band Patient awake    Reviewed: Allergy & Precautions, H&P , NPO status , Patient's Chart, lab work & pertinent test results  Airway Mallampati: II   Neck ROM: full    Dental   Pulmonary Current Smoker,    breath sounds clear to auscultation       Cardiovascular hypertension, + Peripheral Vascular Disease  + dysrhythmias Atrial Fibrillation + Valvular Problems/Murmurs AS  Rhythm:regular Rate:Normal  TTE (05/2020): moderate AS. Mean PG 22 mmHg. Peak 48mmHg.   Neuro/Psych    GI/Hepatic   Endo/Other    Renal/GU      Musculoskeletal   Abdominal   Peds  Hematology   Anesthesia Other Findings   Reproductive/Obstetrics                            Anesthesia Physical Anesthesia Plan  ASA: III and emergent  Anesthesia Plan: General   Post-op Pain Management:    Induction: Intravenous, Cricoid pressure planned and Rapid sequence  PONV Risk Score and Plan: 1 and Ondansetron, Dexamethasone, Midazolam and Treatment may vary due to age or medical condition  Airway Management Planned: Oral ETT  Additional Equipment:   Intra-op Plan:   Post-operative Plan: Extubation in OR  Informed Consent: I have reviewed the patients History and Physical, chart, labs and discussed the procedure including the risks, benefits and alternatives for the proposed anesthesia with the patient or authorized representative who has indicated his/her understanding and acceptance.       Plan Discussed with: CRNA, Anesthesiologist and Surgeon  Anesthesia Plan Comments:         Anesthesia Quick Evaluation

## 2020-06-25 NOTE — ED Notes (Signed)
Pressure applied to Rt groin area, EDP using doppler to check bilateral lower extremity pulses.

## 2020-06-25 NOTE — ED Notes (Signed)
Vascular PA at bedside  

## 2020-06-25 NOTE — Consult Note (Addendum)
Hospital Consult    Reason for Consult:  Expanding hematoma right groin Requesting Physician:  ER MRN #:  [id number]  History of Present Illness: This is a 74 y.o. male who presents with expanding hematoma right groin.  He is s/p angiogram with coil embolization of the right internal iliac artery by Dr. Oneida Alar yesterday.  He states that this morning, he felt a pop in his right groin and felt pain and swelling in his groin.  He has presented to the ER for evaluation via EMS.  His blood pressures are soft with systolic in the 98'Y.  He does have an ace wrap in place around thigh hematoma.   He last ate at 0930.  He is on Xarelto but did not take his dose this am.  His last dose was on 11/15.    Past Medical History:  Diagnosis Date  . Aortic stenosis    a. Mild by echo 04/2015.  Marland Kitchen PAF (paroxysmal atrial fibrillation) ([place])   . Tobacco abuse     Past Surgical History:  Procedure Laterality Date  . CERVICAL SPINE SURGERY    . LEG SURGERY     Trauma  . RETINAL DETACHMENT SURGERY      Allergies  Allergen Reactions  . Azithromycin Diarrhea  . 2,4-D Dimethylamine (Amisol) Other (See Comments) and Itching    Prior to Admission medications   Medication Sig Start Date End Date Taking? Authorizing Provider  acetaminophen (TYLENOL) 500 MG tablet Take 500-1,000 mg by mouth every 6 (six) hours as needed (for pain.).    [provider]  cetirizine (ZYRTEC) 10 MG tablet Take 1 tablet (10 mg total) by mouth daily. Patient taking differently: Take 10 mg by mouth daily as needed for allergies.  12/10/16   Jaynee Eagles, PA-C  cholecalciferol (VITAMIN D) 25 MCG (1000 UNIT) tablet Take 1,000 Units by mouth daily.    [provider]  diltiazem (TIAZAC) 120 MG 24 hr capsule Take 1 capsule by mouth once daily Patient taking differently: Take 120 mg by mouth daily.  02/08/20   [name], MD  fluticasone (FLONASE) 50 MCG/ACT nasal spray Place 2 sprays into both nostrils  daily. Patient taking differently: Place 2 sprays into both nostrils daily as needed for allergies.  08/02/17   Rutherford Guys, MD  metoprolol tartrate (LOPRESSOR) 25 MG tablet Take 1 tablet by mouth twice daily as needed Patient taking differently: Take 25 mg by mouth 2 (two) times daily as needed (afib).  06/01/20   [name], MD  rivaroxaban (XARELTO) 20 MG TABS tablet Take 1 tablet (20 mg total) by mouth daily with supper. 05/13/20   [name], MD  rosuvastatin (CRESTOR) 10 MG tablet Take 1 tablet (10 mg total) by mouth daily. Patient taking differently: Take 10 mg by mouth every evening.  04/25/20   Burchette, Alinda Sierras, MD    Social History   Socioeconomic History  . Marital status: Married    Spouse name: Not on file  . Number of children: 5  . Years of education: Not on file  . Highest education level: Not on file  Occupational History  . Not on file  Tobacco Use  . Smoking status: Current Every Day Smoker    Packs/day: 1.00    Years: 50.00    Pack years: 50.00    Types: Cigarettes  . Smokeless tobacco: Never Used  Vaping Use  . Vaping Use: Never used  Substance and Sexual Activity  . Alcohol use: Yes  Alcohol/week: 0.0 standard drinks    Comment: 4 to 5 times a week; beer, wine and liquor   . Drug use: No  . Sexual activity: Not on file  Other Topics Concern  . Not on file  Social History Narrative   Scores reading tests.  Lives with wife.     Social Determinants of Health   Financial Resource Strain:   . Difficulty of Paying Living Expenses: Not on file  Food Insecurity:   . Worried About Charity fundraiser in the Last Year: Not on file  . Ran Out of Food in the Last Year: Not on file  Transportation Needs:   . Lack of Transportation (Medical): Not on file  . Lack of Transportation (Non-Medical): Not on file  Physical Activity:   . Days of Exercise per Week: Not on file  . Minutes of Exercise per Session: Not on file  Stress:   . Feeling of  Stress : Not on file  Social Connections:   . Frequency of Communication with Friends and Family: Not on file  . Frequency of Social Gatherings with Friends and Family: Not on file  . Attends Religious Services: Not on file  . Active Member of Clubs or Organizations: Not on file  . Attends Archivist Meetings: Not on file  . Marital Status: Not on file  Intimate Partner Violence:   . Fear of Current or Ex-Partner: Not on file  . Emotionally Abused: Not on file  . Physically Abused: Not on file  . Sexually Abused: Not on file     Family History  Problem Relation Age of Onset  . Ovarian cancer Mother   . Alcoholism Father   . Breast cancer Sister   . Ovarian cancer Sister     ROS:  Please see HPI  Physical Examination  Vitals:   06/25/20 1045 06/25/20 1050  BP: (!) 81/62 (!) 88/73  Pulse: 86 85  Resp: (!) 21 16  Temp:    SpO2: 96% 98%   Body mass index is 26.78 kg/m.  General:  WDWN in NAD Gait: Not observed HENT: WNL, normocephalic Pulmonary: normal non-labored breathing Cardiac: regular Abdomen:  soft, NT Skin: without rashes Vascular Exam/Pulses: + brisk right PT doppler signal is heard.  Extremities: without ischemic changes, without Gangrene , without cellulitis; without open wounds; right femoral pulse is palpated.  There is swelling in the right groin and thigh.   Musculoskeletal: no muscle wasting or atrophy  Neurologic: A&O X 3; speech is fluent/normal Psychiatric:  The pt has Normal affect.   CBC    Component Value Date/Time   WBC 8.0 06/24/2020 0849   RBC 4.98 06/24/2020 0849   HGB 16.0 06/24/2020 0849   HGB 16.2 05/08/2019 1115   HCT 48.1 06/24/2020 0849   HCT 49.1 05/08/2019 1115   PLT 153 06/24/2020 0849   PLT CANCELED 05/08/2019 1115   MCV 96.6 06/24/2020 0849   MCV 98 (H) 05/08/2019 1115   MCH 32.1 06/24/2020 0849   MCHC 33.3 06/24/2020 0849   RDW 13.0 06/24/2020 0849   RDW 12.0 05/08/2019 1115   LYMPHSABS 2.5 04/01/2013  1248   MONOABS 0.7 04/01/2013 1248   EOSABS 0.2 04/01/2013 1248   BASOSABS 0.0 04/01/2013 1248    BMET    Component Value Date/Time   NA 139 06/24/2020 1000   NA 141 05/16/2018 1425   K 4.2 06/24/2020 1000   CL 106 06/24/2020 1000   CO2 26 06/24/2020 1000  GLUCOSE 101 (H) 06/24/2020 1000   BUN 11 06/24/2020 1000   BUN 13 05/16/2018 1425   CREATININE 0.92 06/24/2020 1000   CALCIUM 8.5 (L) 06/24/2020 1000   GFRNONAA >60 06/24/2020 1000   GFRAA 94 05/16/2018 1425    COAGS: No results found for: INR, PROTIME   Non-Invasive Vascular Imaging:   None today   ASSESSMENT/PLAN: This is a 74 y.o. male s/p angiogram with coil embolization of the right internal iliac artery by Dr. Oneida Alar yesterday who presents with pain and swelling in right groin.   -will take emergently to the OR for right groin exploration and right femoral artery repair.  Pt has not had his Xarelto since 11/15.   -he has been typed and crossed and will have 2 units of PRBC's available.   -covid test pending.  -last ate at 0930-discussed with anesthesia and will proceed emergently.   Leontine Locket, PA-C [facility] [phone]  I have interviewed the patient and examined the patient. I agree with the findings by the PA.  Gae Gallop, MD [phone]

## 2020-06-25 NOTE — ED Notes (Signed)
Blood consent was obtained from pt & witnessed by this RN electronically.

## 2020-06-25 NOTE — Anesthesia Procedure Notes (Signed)
Arterial Line Insertion Start/End11/20/2021 12:56 PM, 06/25/2020 12:58 PM Performed by: Albertha Ghee, MD, anesthesiologist  Patient location: OR. Preanesthetic checklist: patient identified, IV checked, site marked, risks and benefits discussed, surgical consent, monitors and equipment checked, pre-op evaluation, timeout performed and anesthesia consent Patient sedated Left, radial was placed Catheter size: 20 G Hand hygiene performed , maximum sterile barriers used  and Seldinger technique used Allen's test indicative of satisfactory collateral circulation Attempts: 1 Procedure performed without using ultrasound guided technique. Following insertion, Biopatch and dressing applied. Post procedure assessment: normal  Patient tolerated the procedure well with no immediate complications.

## 2020-06-25 NOTE — ED Provider Notes (Signed)
[facility] EMERGENCY DEPARTMENT Provider Note   CSN: [id number] Arrival date & time: 06/25/20  1017     History Chief Complaint  Patient presents with  . Groin swelling  . Bleeding Aneursym    Cory [name] is a 74 y.o. male.  Patient has right-sided groin swelling and bruising that is getting worse.  Sudden onset this morning.  Recent endovascular repair of iliac aneurysm.  Hypotensive with EMS given IV fluids with mild response.  Patient was pale diaphoretic per EMS but that is also improved.  Patient is no longer anticoagulated since his surgery, he was told he could restart these but did not.  He has pain is moderate location the swelling.        Past Medical History:  Diagnosis Date  . Aortic stenosis    a. Mild by echo 04/2015.  Marland Kitchen PAF (paroxysmal atrial fibrillation) ([place])   . Tobacco abuse     Patient Active Problem List   Diagnosis Date Noted  . PAD (peripheral artery disease) ([place]) 06/25/2020  . Educated about COVID-19 virus infection 05/12/2020  . Tobacco abuse 05/12/2020  . Peripheral vascular disease ([place]) 04/25/2020  . Abdominal aortic aneurysm (AAA) ([place]) 04/25/2020  . Essential hypertension 05/16/2018  . Medication management 05/16/2018  . Piriformis syndrome of right side 05/06/2018  . Allergy 10/29/2017  . Recurrent sinusitis 10/29/2017  . Atrial fibrillation, transient ([place]) 06/07/2015  . Aortic stenosis   . ABNORMAL ELECTROCARDIOGRAM 07/14/2009    Past Surgical History:  Procedure Laterality Date  . CERVICAL SPINE SURGERY    . LEG SURGERY     Trauma  . RETINAL DETACHMENT SURGERY         Family History  Problem Relation Age of Onset  . Ovarian cancer Mother   . Alcoholism Father   . Breast cancer Sister   . Ovarian cancer Sister     Social History   Tobacco Use  . Smoking status: Current Every Day Smoker    Packs/day: 1.00    Years: 50.00    Pack years: 50.00    Types: Cigarettes  . Smokeless tobacco:  Never Used  Vaping Use  . Vaping Use: Never used  Substance Use Topics  . Alcohol use: Yes    Alcohol/week: 0.0 standard drinks    Comment: 4 to 5 times a week; beer, wine and liquor   . Drug use: No    Home Medications Prior to Admission medications   Medication Sig Start Date End Date Taking? Authorizing Provider  acetaminophen (TYLENOL) 500 MG tablet Take 500-1,000 mg by mouth every 6 (six) hours as needed (for pain.).   Yes [provider]  cetirizine (ZYRTEC) 10 MG tablet Take 1 tablet (10 mg total) by mouth daily. Patient taking differently: Take 10 mg by mouth daily as needed for allergies.  12/10/16  Yes Jaynee Eagles, PA-C  cholecalciferol (VITAMIN D) 25 MCG (1000 UNIT) tablet Take 1,000 Units by mouth daily.   Yes [provider]  diltiazem (TIAZAC) 120 MG 24 hr capsule Take 1 capsule by mouth once daily Patient taking differently: Take 120 mg by mouth daily.  02/08/20  Yes [name], MD  fluticasone (FLONASE) 50 MCG/ACT nasal spray Place 2 sprays into both nostrils daily. Patient taking differently: Place 2 sprays into both nostrils daily as needed for allergies.  08/02/17  Yes Rutherford Guys, MD  metoprolol tartrate (LOPRESSOR) 25 MG tablet Take 1 tablet by mouth twice daily as needed Patient taking differently:  Take 25 mg by mouth 2 (two) times daily as needed (afib).  06/01/20  Yes [name], MD  rivaroxaban (XARELTO) 20 MG TABS tablet Take 1 tablet (20 mg total) by mouth daily with supper. 05/13/20  Yes [name], MD  rosuvastatin (CRESTOR) 10 MG tablet Take 1 tablet (10 mg total) by mouth daily. Patient taking differently: Take 10 mg by mouth every evening.  04/25/20  Yes Burchette, Alinda Sierras, MD    Allergies    Azithromycin and 2,4-d dimethylamine (amisol)  Review of Systems   Review of Systems  Constitutional: Negative for chills and fever.  HENT: Negative for congestion and rhinorrhea.   Respiratory: Negative for cough and shortness of  breath.   Cardiovascular: Negative for chest pain and palpitations.  Gastrointestinal: Negative for diarrhea, nausea and vomiting.  Genitourinary: Negative for difficulty urinating and dysuria.  Musculoskeletal: Positive for myalgias. Negative for arthralgias and back pain.  Skin: Positive for color change and wound. Negative for rash.  Neurological: Negative for light-headedness and headaches.    Physical Exam Updated Vital Signs BP 108/61 (BP Location: Left Arm)   Pulse 65   Temp 97.7 F (36.5 C) (Oral)   Resp 12   Ht 6\' 4"  (1.93 m)   Wt 99.8 kg   SpO2 94%   BMI 26.78 kg/m   Physical Exam Vitals and nursing note reviewed. Exam conducted with a chaperone present.  Constitutional:      General: He is not in acute distress.    Appearance: Normal appearance.  HENT:     Head: Normocephalic and atraumatic.     Nose: No rhinorrhea.  Eyes:     General:        Right eye: No discharge.        Left eye: No discharge.     Conjunctiva/sclera: Conjunctivae normal.  Cardiovascular:     Rate and Rhythm: Normal rate and regular rhythm.  Pulmonary:     Effort: Pulmonary effort is normal.     Breath sounds: No stridor.  Abdominal:     General: Abdomen is flat. There is no distension.     Palpations: Abdomen is soft.  Musculoskeletal:        General: Swelling and tenderness present. No deformity or signs of injury.     Comments: Arterial puncture site clean dry and intact, this is in the right groin, large tender tense hematoma of the proximal medial thigh, nonpulsatile, dorsalis pedal unable to Doppler posterior tibialis able to Doppler with biphasic wave.  Skin:    General: Skin is warm and dry.  Neurological:     General: No focal deficit present.     Mental Status: He is alert. Mental status is at baseline.     Motor: No weakness.  Psychiatric:        Mood and Affect: Mood normal.        Behavior: Behavior normal.        Thought Content: Thought content normal.     ED  Results / Procedures / Treatments   Labs (all labs ordered are listed, but only abnormal results are displayed) Labs Reviewed  CBC - Abnormal; Notable for the following components:      Result Value   WBC 12.0 (*)    RBC 3.89 (*)    Hemoglobin 12.7 (*)    MCV 100.8 (*)    All other components within normal limits  BASIC METABOLIC PANEL - Abnormal; Notable for the following components:   Potassium 3.4 (*)  Glucose, Bld 127 (*)    Calcium 8.1 (*)    All other components within normal limits  CBC - Abnormal; Notable for the following components:   WBC 13.4 (*)    RBC 3.12 (*)    Hemoglobin 10.1 (*)    HCT 30.8 (*)    All other components within normal limits  I-STAT CHEM 8, ED - Abnormal; Notable for the following components:   Glucose, Bld 107 (*)    Calcium, Ion 1.13 (*)    Hemoglobin 11.2 (*)    HCT 33.0 (*)    All other components within normal limits  RESPIRATORY PANEL BY RT PCR (FLU A&B, COVID)  APTT  PROTIME-INR  BASIC METABOLIC PANEL  LIPID PANEL  HEPATIC FUNCTION PANEL  TYPE AND SCREEN  PREPARE RBC (CROSSMATCH)  ABO/RH    EKG None  Radiology PERIPHERAL VASCULAR CATHETERIZATION  Result Date: 06/24/2020 Procedure: Ultrasound right groin, first order catheterization right internal iliac artery, coil embolization of right internal iliac artery aneurysm Preoperative diagnosis: Right internal iliac artery aneurysm Postoperative diagnosis: Same Anesthesia: Local Operative findings: Coil embolization right internal iliac artery Nester coils 27 and a combination of 810 and 12's Operative details: After team informed consent, patient was taken the [place] lab.  The patient was placed in supine position angio table.  Both groins were prepped and draped in usual sterile fashion.  Local anesthesia was infiltrated over the right common femoral artery.  Introducer needle was used to cannulate right common femoral artery under ultrasound guidance.  Care was taken to stick above the  femoral bifurcation and below the inguinal ligament.  3 5 versa core wire was threaded up in the abdominal aorta and fluoroscopic guidance.  Next a 5 French sheath placed over guidewire in the right common femoral artery.  This was thoroughly flushed heparinized saline.  Contrast angiogram was then obtained retrograde through the right femoral sheath.  This was to determine the iliac bifurcation level.  This was marked for roadmapping.  Next an 035 90 degree Berenstein catheter was brought up on the field and advanced over the wire to selectively catheterize the right internal iliac artery.  I was able to get an 035 angled Glidewire to engage into the internal iliac artery on several occasions but could not get enough purchase to advance the catheter and sheath over this.  Therefore the 5 French sheath was swapped out over guidewire for a 6 and half [place] Oscor articulating sheath.  This was advanced up just above the iliac bifurcation and then articulated did engage the right internal iliac artery.  I was then able to advance an 035 angled Glidewire deep into the secondary branches of the internal iliac artery and then advance a 5 French straight catheter over this.  I proceeded at this point to coil embolize the right internal iliac artery using a combination of 1210 and 8 Nester coils.  We filled this all the way up to within about a centimeter of the origin of the internal iliac artery.  Contrast angiogram showed minimal filling of the aneurysm and no filling of the distal branches at this portion of the case.  The Oscor sheath was straightened under fluoroscopy and removed and swapped out for a 7 [place] short sheath.  This was thoroughly flushed heparinized saline.  The patient tired procedure well and there were no complications.  Patient taken the holding area in stable condition.  Sheath will be removed when ACT is less than 175. [name], MD  [facility] Office:  [phone]    Procedures .Critical Care Performed by: Breck Coons, MD Authorized by: Breck Coons, MD   Critical care provider statement:    Critical care time (minutes):  60   Critical care was necessary to treat or prevent imminent or life-threatening deterioration of the following conditions:  Circulatory failure   Critical care was time spent personally by me on the following activities:  Discussions with consultants, evaluation of patient's response to treatment, examination of patient, ordering and performing treatments and interventions, ordering and review of laboratory studies, ordering and review of radiographic studies, pulse oximetry, re-evaluation of patient's condition, obtaining history from patient or surrogate, review of old charts, blood draw for specimens and development of treatment plan with patient or surrogate   (including critical care time)  Medications Ordered in ED Medications  0.9 %  sodium chloride infusion (Manually program via Guardrails IV Fluids) ( Intravenous MAR Unhold 06/25/20 1538)  0.9 %  sodium chloride infusion ( Intravenous New Bag/Given 06/25/20 1950)  loratadine (CLARITIN) tablet 10 mg (has no administration in time range)  cholecalciferol (VITAMIN D3) tablet 1,000 Units (has no administration in time range)  diltiazem (CARDIZEM CD) 24 hr capsule 120 mg (has no administration in time range)  fluticasone (FLONASE) 50 MCG/ACT nasal spray 2 spray (has no administration in time range)  metoprolol tartrate (LOPRESSOR) tablet 25 mg (has no administration in time range)  rosuvastatin (CRESTOR) tablet 10 mg (has no administration in time range)  0.9 %  sodium chloride infusion (has no administration in time range)  magnesium sulfate IVPB 2 g 50 mL (has no administration in time range)  potassium chloride SA (KLOR-CON) CR tablet 20-40 mEq (has no administration in time range)  acetaminophen (TYLENOL) tablet 325-650 mg (has no administration in time  range)    Or  acetaminophen (TYLENOL) suppository 325-650 mg (has no administration in time range)  docusate sodium (COLACE) capsule 100 mg (has no administration in time range)  ondansetron (ZOFRAN) injection 4 mg (has no administration in time range)  alum & mag hydroxide-simeth (MAALOX/MYLANTA) 200-200-20 MG/5ML suspension 15-30 mL (has no administration in time range)  pantoprazole (PROTONIX) EC tablet 40 mg (40 mg Oral Given 06/25/20 1800)  labetalol (NORMODYNE) injection 10 mg (has no administration in time range)  hydrALAZINE (APRESOLINE) injection 5 mg (has no administration in time range)  guaiFENesin-dextromethorphan (ROBITUSSIN DM) 100-10 MG/5ML syrup 15 mL (15 mLs Oral Given 06/25/20 2208)  phenol (CHLORASEPTIC) mouth spray 1 spray (has no administration in time range)  oxyCODONE-acetaminophen (PERCOCET/ROXICET) 5-325 MG per tablet 1-2 tablet (2 tablets Oral Given 06/25/20 2208)  morphine 2 MG/ML injection 2 mg (has no administration in time range)  polyethylene glycol (MIRALAX / GLYCOLAX) packet 17 g (has no administration in time range)  bisacodyl (DULCOLAX) suppository 10 mg (has no administration in time range)  metoprolol tartrate (LOPRESSOR) injection 2-5 mg (has no administration in time range)  ceFAZolin (ANCEF) IVPB 2g/100 mL premix (2 g Intravenous New Bag/Given 06/25/20 1234)  0.9 %  sodium chloride infusion ( Intravenous Stopped 06/25/20 1137)  ceFAZolin (ANCEF) IVPB 2g/100 mL premix (2 g Intravenous New Bag/Given 06/26/20 0009)    ED Course  I have reviewed the triage vital signs and the nursing notes.  Pertinent labs & imaging results that were available during my care of the patient were reviewed by me and considered in my medical decision making (see chart for details).    MDM Rules/Calculators/A&P  Patient arrived via EMS with concern for arterial bleed into soft tissue of the thigh, initial BP 80/50.  Pressure dressing is immediately  applied by myself and team.  Vascular surgery is emergently consulted, they come to bedside recommend operative repair of likely complicated endovascular repair from a day or so ago.  He is on no anticoagulation needs no reversal.  Patient is typed and screened for blood as he was initially hypotensive but is remained stable with blood pressures in the 90s.  I asked the vascular surgery team if they would like me to transfuse blood at this point, they said no not in place he becomes hypotensive or symptomatic again.  Patient agrees this plan.  Laboratory studies sent and reviewed by myself show hemoglobin 12.7 mild leukocytosis, otherwise unremarkable.  The patient will be admitted to the vascular surgery team.  For the remainder this patient's care please see inpatient team notes.  I will intervene as needed while the patient remains in the emergency department.  CRITICAL CARE Performed by: Breck Coons   Total critical care time: 60 minutes  Critical care time was exclusive of separately billable procedures and treating other patients.  Critical care was necessary to treat or prevent imminent or life-threatening deterioration.  Critical care was time spent personally by me on the following activities: development of treatment plan with patient and/or surrogate as well as nursing, discussions with consultants, evaluation of patient's response to treatment, examination of patient, obtaining history from patient or surrogate, ordering and performing treatments and interventions, ordering and review of laboratory studies, ordering and review of radiographic studies, pulse oximetry and re-evaluation of patient's condition.   Final Clinical Impression(s) / ED Diagnoses Final diagnoses:  Thigh hematoma, right, initial encounter  Postoperative hematoma involving circulatory system following other circulatory system procedure    Rx / DC Orders ED Discharge Orders    None       Breck Coons,  MD 06/26/20 [phone]

## 2020-06-25 NOTE — ED Notes (Signed)
Dr Dixon at bedside.

## 2020-06-25 NOTE — Anesthesia Procedure Notes (Signed)
Procedure Name: Intubation Date/Time: 06/25/2020 12:51 PM Performed by: Wilburn Cornelia, CRNA Pre-anesthesia Checklist: Patient identified, Emergency Drugs available, Suction available, Patient being monitored and Timeout performed Patient Re-evaluated:Patient Re-evaluated prior to induction Oxygen Delivery Method: Circle system utilized Preoxygenation: Pre-oxygenation with 100% oxygen Induction Type: IV induction, Rapid sequence and Cricoid Pressure applied Laryngoscope Size: Mac and 4 Grade View: Grade I Tube type: Oral Tube size: 7.5 mm Number of attempts: 1 Airway Equipment and Method: Stylet Placement Confirmation: ETT inserted through vocal cords under direct vision,  positive ETCO2,  CO2 detector and breath sounds checked- equal and bilateral Secured at: 23 cm Tube secured with: Tape Dental Injury: Teeth and Oropharynx as per pre-operative assessment

## 2020-06-26 LAB — CBC
HCT: 30.8 % — ABNORMAL LOW (ref 39.0–52.0)
Hemoglobin: 10.1 g/dL — ABNORMAL LOW (ref 13.0–17.0)
MCH: 32.4 pg (ref 26.0–34.0)
MCHC: 32.8 g/dL (ref 30.0–36.0)
MCV: 98.7 fL (ref 80.0–100.0)
Platelets: 151 10*3/uL (ref 150–400)
RBC: 3.12 MIL/uL — ABNORMAL LOW (ref 4.22–5.81)
RDW: 13 % (ref 11.5–15.5)
WBC: 13.4 10*3/uL — ABNORMAL HIGH (ref 4.0–10.5)
nRBC: 0 % (ref 0.0–0.2)

## 2020-06-26 LAB — BASIC METABOLIC PANEL
Anion gap: 4 — ABNORMAL LOW (ref 5–15)
BUN: 7 mg/dL — ABNORMAL LOW (ref 8–23)
CO2: 24 mmol/L (ref 22–32)
Calcium: 7.9 mg/dL — ABNORMAL LOW (ref 8.9–10.3)
Chloride: 107 mmol/L (ref 98–111)
Creatinine, Ser: 0.78 mg/dL (ref 0.61–1.24)
GFR, Estimated: >60 mL/min (ref >60)
Glucose, Bld: 143 mg/dL — ABNORMAL HIGH (ref 70–99)
Potassium: 4.2 mmol/L (ref 3.5–5.1)
Sodium: 135 mmol/L (ref 135–145)

## 2020-06-26 LAB — POCT I-STAT 7, (LYTES, BLD GAS, ICA,H+H)
Acid-base deficit: 2 mmol/L (ref 0.0–2.0)
Bicarbonate: 24.2 mmol/L (ref 20.0–28.0)
Calcium, Ion: 1.22 mmol/L (ref 1.15–1.40)
HCT: 31 % — ABNORMAL LOW (ref 39.0–52.0)
Hemoglobin: 10.5 g/dL — ABNORMAL LOW (ref 13.0–17.0)
O2 Saturation: 96 %
Patient temperature: 36.9
Potassium: 4 mmol/L (ref 3.5–5.1)
Sodium: 139 mmol/L (ref 135–145)
TCO2: 26 mmol/L (ref 22–32)
pCO2 arterial: 47.9 mmHg (ref 32.0–48.0)
pH, Arterial: 7.31 — ABNORMAL LOW (ref 7.350–7.450)
pO2, Arterial: 87 mmHg (ref 83.0–108.0)

## 2020-06-26 LAB — LIPID PANEL
Cholesterol: 108 mg/dL (ref 0–200)
HDL: 49 mg/dL (ref >40)
LDL Cholesterol: 50 mg/dL (ref 0–99)
Total CHOL/HDL Ratio: 2.2 RATIO
Triglycerides: 44 mg/dL (ref <150)
VLDL: 9 mg/dL (ref 0–40)

## 2020-06-26 LAB — HEPATIC FUNCTION PANEL
ALT: 13 U/L (ref 0–44)
AST: 10 U/L — ABNORMAL LOW (ref 15–41)
Albumin: 2.4 g/dL — ABNORMAL LOW (ref 3.5–5.0)
Alkaline Phosphatase: 24 U/L — ABNORMAL LOW (ref 38–126)
Bilirubin, Direct: 0.1 mg/dL (ref 0.0–0.2)
Indirect Bilirubin: 0.2 mg/dL — ABNORMAL LOW (ref 0.3–0.9)
Total Bilirubin: 0.3 mg/dL (ref 0.3–1.2)
Total Protein: 5 g/dL — ABNORMAL LOW (ref 6.5–8.1)

## 2020-06-26 NOTE — Progress Notes (Addendum)
  Progress Note    06/26/2020 7:12 AM 1 Day Post-Op  Subjective:  Feels better.   Afebrile HR 60's-70's NSR 54'Y-503'T systolic 46% RA  Vitals:   06/26/20 0000 06/26/20 0400  BP: 100/73 108/61  Pulse: 89 65  Resp: 18 12  Temp: (!) 97.4 F (36.3 C) 97.7 F (36.5 C)  SpO2: 96% 94%    Physical Exam: Cardiac:  regular Lungs:  Non labored Incisions:  Right groin incision is clean and dry with ecchymosis down onto the right thigh.   Extremities:  Thigh must softer this morning.  +doppler signals right DP/PT/peroneal.    CBC    Component Value Date/Time   WBC 13.4 (H) 06/26/2020 0540   RBC 3.12 (L) 06/26/2020 0540   HGB 10.1 (L) 06/26/2020 0540   HGB 16.2 05/08/2019 1115   HCT 30.8 (L) 06/26/2020 0540   HCT 49.1 05/08/2019 1115   PLT 151 06/26/2020 0540   PLT CANCELED 05/08/2019 1115   MCV 98.7 06/26/2020 0540   MCV 98 (H) 05/08/2019 1115   MCH 32.4 06/26/2020 0540   MCHC 32.8 06/26/2020 0540   RDW 13.0 06/26/2020 0540   RDW 12.0 05/08/2019 1115   LYMPHSABS 2.5 04/01/2013 1248   MONOABS 0.7 04/01/2013 1248   EOSABS 0.2 04/01/2013 1248   BASOSABS 0.0 04/01/2013 1248    BMET    Component Value Date/Time   NA 135 06/26/2020 0540   NA 141 05/16/2018 1425   K 4.2 06/26/2020 0540   CL 107 06/26/2020 0540   CO2 24 06/26/2020 0540   GLUCOSE 143 (H) 06/26/2020 0540   BUN 7 (L) 06/26/2020 0540   BUN 13 05/16/2018 1425   CREATININE 0.78 06/26/2020 0540   CALCIUM 7.9 (L) 06/26/2020 0540   GFRNONAA >60 06/26/2020 0540   GFRAA 94 05/16/2018 1425    INR    Component Value Date/Time   INR 1.1 06/25/2020 1030     Intake/Output Summary (Last 24 hours) at 06/26/2020 5681 Last data filed at 06/26/2020 0438 Gross per 24 hour  Intake 2002.5 ml  Output 1860 ml  Net 142.5 ml     Assessment:  74 y.o. male is s/p:  Evacuation hematoma right groin and repair of right common femoral artery  1 Day Post-Op  Plan: -pt doing well this morning.  He has +doppler  signals right AT/PT/peroneal. -JP put out 110cc since surgery.  There is about 15cc in the drain this morning.  May need to leave another day but will discuss with Dr. Scot Dock.  -DVT prophylaxis:  Pt on Xarelto at home.  He has not had this since 11/15.  He has hx of PAF.  He is in NSR this morning.  May need to hold another day but I will also discuss this with Dr. Scot Dock.  SCD's for DVT prophylaxis for now.  -acute blood loss anemia is stable and pt is tolerating.  He did not require transfusion.   Leontine Locket, PA-C [facility] [phone] 06/26/2020 7:12 AM  I have interviewed the patient and examined the patient. I agree with the findings by the PA.  Hopefully home tomorrow if we can get the JP out.  The wife does not feel comfortable with him going home with the drain.  Gae Gallop, MD [phone]

## 2020-06-26 NOTE — Evaluation (Signed)
Physical Therapy Evaluation Patient Details Name: Cory [name] MRN: [id number] DOB: 11-Sep-1945 Today's Date: 06/26/2020   History of Present Illness  RAYNALD [name] is a 74 y.o. M who underwent catheterization 11/20 via right femoral approach for coil embolization of internal iliac artery. Discharged home and developed large hematoma. Readmitted for evacuation of right groin hematoma and repair of right common femoral artery 11/20. PMHx:HTN, Afib, PVD,   Clinical Impression  Prior to admission, pt lives with his spouse and is independent. Presents with decreased functional mobility secondary to pain and gait abnormalities. Pt able to participate in seated therapeutic exercises and ambulated 200 feet with no assistive device at a supervision level. Don't anticipate need for PT follow up. Will continue to follow acutely to promote mobility.    Follow Up Recommendations No PT follow up    Equipment Recommendations  None recommended by PT    Recommendations for Other Services       Precautions / Restrictions Precautions Precautions: Fall;Other (comment) Precaution Comments: drain Restrictions Weight Bearing Restrictions: No      Mobility  Bed Mobility               General bed mobility comments: in recliner pre and post session    Transfers Overall transfer level: Modified independent               General transfer comment: no physical assist required  Ambulation/Gait Ambulation/Gait assistance: Supervision Gait Distance (Feet): 200 Feet Assistive device: None Gait Pattern/deviations: Step-through pattern;Decreased stance time - right;Antalgic Gait velocity: decreased   General Gait Details: Mildly antalgic gait which increased with distance, supervision for safety, no gross imbalance  Stairs            Wheelchair Mobility    Modified Rankin (Stroke Patients Only)       Balance Overall balance assessment: Mild deficits observed, not formally  tested                                           Pertinent Vitals/Pain Pain Assessment: Faces Faces Pain Scale: Hurts little more Pain Location: Hip Pain Descriptors / Indicators: Grimacing;Guarding;Discomfort Pain Intervention(s): Limited activity within patient's tolerance;Monitored during session    Home Living Family/patient expects to be discharged to:: Private residence Living Arrangements: Spouse/significant other Available Help at Discharge: Family;Available 24 hours/day Type of Home: House Home Access: Stairs to enter   [employer] of Steps: 1 Home Layout: Two level;Full bath on main level Home Equipment: Grab bars - tub/shower      Prior Function Level of Independence: Independent               Hand Dominance   Dominant Hand: Right    Extremity/Trunk Assessment   Upper Extremity Assessment Upper Extremity Assessment: Overall WFL for tasks assessed    Lower Extremity Assessment Lower Extremity Assessment: RLE deficits/detail;LLE deficits/detail RLE Deficits / Details: s/p hematoma evac, at least 3/5 strength LLE Deficits / Details: 5/5 strength    Cervical / Trunk Assessment Cervical / Trunk Assessment: Normal  Communication   Communication: No difficulties  Cognition Arousal/Alertness: Awake/alert Behavior During Therapy: WFL for tasks assessed/performed Overall Cognitive Status: Within Functional Limits for tasks assessed  General Comments General comments (skin integrity, edema, etc.): VSS on RA    Exercises General Exercises - Lower Extremity Long Arc Quad: Both;10 reps;Seated Hip Flexion/Marching: Both;10 reps;Seated   Assessment/Plan    PT Assessment Patient needs continued PT services  PT Problem List Decreased strength;Decreased activity tolerance;Decreased balance;Decreased mobility;Pain       PT Treatment Interventions DME instruction;Gait  training;Stair training;Functional mobility training;Therapeutic activities;Therapeutic exercise;Balance training;Patient/family education    PT Goals (Current goals can be found in the Care Plan section)  Acute Rehab PT Goals Patient Stated Goal: to go home PT Goal Formulation: With patient Time For Goal Achievement: 07/10/20 Potential to Achieve Goals: Good    Frequency Min 3X/week   Barriers to discharge        Co-evaluation               AM-PAC PT "6 Clicks" Mobility  Outcome Measure Help needed turning from your back to your side while in a flat bed without using bedrails?: None Help needed moving from lying on your back to sitting on the side of a flat bed without using bedrails?: None Help needed moving to and from a bed to a chair (including a wheelchair)?: None Help needed standing up from a chair using your arms (e.g., wheelchair or bedside chair)?: None Help needed to walk in hospital room?: None Help needed climbing 3-5 steps with a railing? : A Little 6 Click Score: 23    End of Session   Activity Tolerance: Patient tolerated treatment well Patient left: in chair;with call bell/phone within reach Nurse Communication: Mobility status PT Visit Diagnosis: Pain;Difficulty in walking, not elsewhere classified (R26.2) Pain - Right/Left: Right Pain - part of body: Leg    Time: 7121-9758 PT Time Calculation (min) (ACUTE ONLY): 12 min   Charges:   PT Evaluation $PT Eval Moderate Complexity: 1 Mod          Wyona Almas, PT, DPT Acute Rehabilitation Services Pager [phone] Office [phone]   Deno Etienne 06/26/2020, 12:02 PM

## 2020-06-26 NOTE — Evaluation (Signed)
Occupational Therapy Evaluation Patient Details Name: Cory [name] MRN: [id number] DOB: 11-29-1945 Today's Date: 06/26/2020    History of Present Illness Cory [name] is a 74 y.o. male who underwent catheterization yesterday with bleeding after catheterization and emergency repair Evacuation hematoma right groin and repair of right common femoral artery PMHx:HTN, Afib, PVD,    Clinical Impression   Pt PTA: Pt recently admitted for cath and went home for a day and felt discomfort and pain in R groin and came to hospital. Pt reports independence prior  With ADL and mobility. Pt currently with pain in R groin area, but pt continues to be modified independence with ADL and mobility in room with no AD. No physical assist required today. HR 99 BPM with exertion; BP 103/64 after exertion. O2 >90% throughout session. Pt does not require continued OT skilled services. OT signing off. Thank you for this referral.    Follow Up Recommendations  No OT follow up    Equipment Recommendations  None recommended by OT    Recommendations for Other Services       Precautions / Restrictions Precautions Precautions: Fall;Other (comment) Precaution Comments: drain Restrictions Weight Bearing Restrictions: No      Mobility Bed Mobility               General bed mobility comments: in recliner pre and post session    Transfers Overall transfer level: Modified independent               General transfer comment: no physical assist required; managing IV pole well    Balance Overall balance assessment: Modified Independent                                         ADL either performed or assessed with clinical judgement   ADL Overall ADL's : Modified independent;At baseline                                             Vision Baseline Vision/History: Wears glasses Wears Glasses: At all times;Reading only Patient Visual Report: No change from  baseline Vision Assessment?: Yes;No apparent visual deficits     Perception     Praxis      Pertinent Vitals/Pain       Hand Dominance Right   Extremity/Trunk Assessment Upper Extremity Assessment Upper Extremity Assessment: Overall WFL for tasks assessed   Lower Extremity Assessment Lower Extremity Assessment: Generalized weakness;RLE deficits/detail RLE Deficits / Details: s/p hematoma evac   Cervical / Trunk Assessment Cervical / Trunk Assessment: Normal   Communication Communication Communication: No difficulties   Cognition Arousal/Alertness: Awake/alert Behavior During Therapy: WFL for tasks assessed/performed Overall Cognitive Status: Within Functional Limits for tasks assessed                                     General Comments  HR 99 BPM with exertion; BP 103/64 after exertion. O2 >90% throughout session    Exercises     Shoulder Instructions      Home Living Family/patient expects to be discharged to:: Private residence Living Arrangements: Spouse/significant other Available Help at Discharge: Family;Available 24 hours/day Type of Home: House Home Access: Stairs to enter  Entrance Stairs-Number of Steps: 1   Home Layout: Two level;Full bath on main level Alternate Level Stairs-Number of Steps: flight   Bathroom Shower/Tub: Occupational psychologist: Handicapped height     Home Equipment: Grab bars - tub/shower          Prior Functioning/Environment Level of Independence: Independent                 OT Problem List: Decreased activity tolerance;Decreased strength;Pain      OT Treatment/Interventions:      OT Goals(Current goals can be found in the care plan section) Acute Rehab OT Goals Patient Stated Goal: to go home OT Goal Formulation: All assessment and education complete, DC therapy Potential to Achieve Goals: Good  OT Frequency:     Barriers to D/C:            Co-evaluation               AM-PAC OT "6 Clicks" Daily Activity     Outcome Measure Help from another person eating meals?: None Help from another person taking care of personal grooming?: None Help from another person toileting, which includes using toliet, bedpan, or urinal?: None Help from another person bathing (including washing, rinsing, drying)?: None Help from another person to put on and taking off regular upper body clothing?: None Help from another person to put on and taking off regular lower body clothing?: None 6 Click Score: 24   End of Session Nurse Communication: Mobility status  Activity Tolerance: Patient tolerated treatment well;Patient limited by pain Patient left: in chair;with call bell/phone within reach  OT Visit Diagnosis: Pain;Muscle weakness (generalized) (M62.81) Pain - Right/Left: Right Pain - part of body: Leg                Time: 3785-8850 OT Time Calculation (min): 21 min Charges:  OT General Charges $OT Visit: 1 Visit OT Evaluation $OT Eval Moderate Complexity: 1 Mod  [name], OTR/L Acute Rehabilitation Services Pager: [phone] Office: [place] 06/26/2020, 10:56 AM

## 2020-06-26 NOTE — Progress Notes (Addendum)
Dressing to right thigh with moderate amount of drainage on gauze. Changed this AM. Assisted patient out of bed to chair.  Hurbert Duran, Bettina Gavia RN

## 2020-06-26 NOTE — Progress Notes (Signed)
PHARMACIST LIPID MONITORING   Cory [name] is a 74 y.o. male admitted on 06/25/2020 with bleeding after catheterization and emergency repair.  Pharmacy has been consulted to optimize lipid-lowering therapy with the indication of secondary prevention for clinical ASCVD.  Recent Labs:  Lipid Panel (last 6 months):   Lab Results  Component Value Date   CHOL 108 06/26/2020   TRIG 44 06/26/2020   HDL 49 06/26/2020   CHOLHDL 2.2 06/26/2020   VLDL 9 06/26/2020   LDLCALC 50 06/26/2020    Hepatic function panel (last 6 months):   Lab Results  Component Value Date   AST 10 (L) 06/26/2020   ALT 13 06/26/2020   ALKPHOS 24 (L) 06/26/2020   BILITOT 0.3 06/26/2020   BILIDIR 0.1 06/26/2020   IBILI 0.2 (L) 06/26/2020    SCr (since admission):   Serum creatinine: 0.78 mg/dL 06/26/20 0540 Estimated creatinine clearance: 96.4 mL/min  Current lipid-lowering therapy: rosuvastatin 10mg  once daily Previous lipid-lowering therapies (if applicable): N/a Documented or reported allergies or intolerances to lipid-lowering therapies (if applicable): N/a  Assessment: LDL 50, at goal.  Recommendation per protocol:  Continue current lipid-lowering therapy.  Follow-up with:  Primary care provider - [name], MD  Follow-up labs after discharge:    Liver function panel and lipid panel in 8-12 weeks then annually  Plan: Continue rosuvastatin 10mg  daily Follow-up with PCP after discharge  Mercy Riding, PharmD PGY1 [place] Resident Please refer to [facility] for unit-specific pharmacist

## 2020-06-26 NOTE — Progress Notes (Addendum)
Thigh dressing at JP drain moderately saturated with bloody drainage, dressing changed. JP drain remains sutured to skin. Emptied JP drain  67ml returned.  Total JP output for today since 7am- 134ml.  Will monitor patient. Avanelle Pixley, Bettina Gavia RN

## 2020-06-26 NOTE — Progress Notes (Signed)
Thigh dressing at JP drain changed. Moderate amount of drainage on dressing. JP drain sutured to skin. Jamerion Cabello, Bettina Gavia RN

## 2020-06-27 ENCOUNTER — Encounter (HOSPITAL_COMMUNITY): Payer: Self-pay | Admitting: Vascular Surgery

## 2020-06-27 MED ORDER — INFLUENZA VAC A&B SA ADJ QUAD 0.5 ML IM PRSY
0.5000 mL | PREFILLED_SYRINGE | Freq: Once | INTRAMUSCULAR | Status: AC
  Administered 2020-06-27: 0.5 mL via INTRAMUSCULAR
  Filled 2020-06-27: qty 0.5

## 2020-06-27 MED ORDER — ROSUVASTATIN CALCIUM 20 MG PO TABS
20.0000 mg | ORAL_TABLET | Freq: Every evening | ORAL | Status: DC
  Administered 2020-06-27: 20 mg via ORAL
  Filled 2020-06-27: qty 1

## 2020-06-27 NOTE — Progress Notes (Signed)
Physical Therapy Treatment Patient Details Name: Cory [name] MRN: [id number] DOB: Jun 14, 1946 Today's Date: 06/27/2020 Physical Therapy Discharge Patient Details Name: Cory [name] MRN: [id number] DOB: 05/23/1946 Today's Date: 06/27/2020 Time: 4975-3005 PT Time Calculation (min) (ACUTE ONLY): 11 min  Patient discharged from PT services secondary to goals met and no further PT needs identified.  Please see latest therapy progress note for current level of functioning and progress toward goals.    Progress and discharge plan discussed with patient and/or caregiver: Patient/Caregiver agrees with plan  GP     Cory [name] 06/27/2020, 3:37 PM    History of Present Illness Cory [name] is a 74 y.o. M who underwent catheterization 11/20 via right femoral approach for coil embolization of internal iliac artery. Discharged home and developed large hematoma. Readmitted for evacuation of right groin hematoma and repair of right common femoral artery 11/20. PMHx:HTN, Afib, PVD,     PT Comments    Pt appears back to baseline.  He has a mild antalgic pattern that he is able to self correct.  Pt denies need for stair training and has two rails at home so this should prove not to be an issue.  Pt performed LE exercises post session and educated to continue exercises 3x daily and ambulate the hall on RN station.  Will inform supervising PT of need for d/c from caseload as goals are met and patient no longer presents with a need for acute PT follow up.    Follow Up Recommendations  No PT follow up     Equipment Recommendations  None recommended by PT    Recommendations for Other Services       Precautions / Restrictions Precautions Precautions: Fall;Other (comment) Precaution Comments: drain Restrictions Weight Bearing Restrictions: No    Mobility  Bed Mobility Overal bed mobility: Modified Independent                Transfers Overall transfer level: Modified  independent Equipment used: None             General transfer comment: no physical assist required  Ambulation/Gait Ambulation/Gait assistance: Independent Gait Distance (Feet): 200 Feet Assistive device: None Gait Pattern/deviations: Step-through pattern;Antalgic Gait velocity: decreased   General Gait Details: Reports pain in L foot as gt progress but no gross LOB and self correct painful gt pattern.   Stairs Stairs:  (refused stair training but likely will be fine with negotiation as he has two rails to hold at home.)           Wheelchair Mobility    Modified Rankin (Stroke Patients Only)       Balance Overall balance assessment: Mild deficits observed, not formally tested                                          Cognition Arousal/Alertness: Awake/alert Behavior During Therapy: WFL for tasks assessed/performed Overall Cognitive Status: Within Functional Limits for tasks assessed                                        Exercises General Exercises - Lower Extremity Ankle Circles/Pumps: AROM;Both;10 reps;Supine Quad Sets: AROM;Both;10 reps;Supine Heel Slides: AROM;Both;10 reps;Supine Straight Leg Raises: Both;5 reps;Supine    General Comments        Pertinent Vitals/Pain  Pain Assessment: Faces Faces Pain Scale: Hurts a little bit Pain Location: all over from being in bed. Pain Descriptors / Indicators: Grimacing;Guarding;Discomfort Pain Intervention(s): Monitored during session;Repositioned    Home Living                      Prior Function            PT Goals (current goals can now be found in the care plan section) Acute Rehab PT Goals Patient Stated Goal: to go home Potential to Achieve Goals: Good Progress towards PT goals: Goals met/education completed, patient discharged from PT    Frequency    Min 3X/week      PT Plan Current plan remains appropriate    Co-evaluation               AM-PAC PT "6 Clicks" Mobility   Outcome Measure  Help needed turning from your back to your side while in a flat bed without using bedrails?: None Help needed moving from lying on your back to sitting on the side of a flat bed without using bedrails?: None Help needed moving to and from a bed to a chair (including a wheelchair)?: None Help needed standing up from a chair using your arms (e.g., wheelchair or bedside chair)?: None Help needed to walk in hospital room?: None Help needed climbing 3-5 steps with a railing? : None 6 Click Score: 24    End of Session   Activity Tolerance: Patient tolerated treatment well Patient left: in bed;with call bell/phone within reach Nurse Communication: Mobility status (informed nursing of d/c from caseload and for patient to ambulate halls.) PT Visit Diagnosis: Pain;Difficulty in walking, not elsewhere classified (R26.2) Pain - Right/Left: Right Pain - part of body: Leg     Time: 0012-3935 PT Time Calculation (min) (ACUTE ONLY): 11 min  Charges:  $Gait Training: 8-22 mins                     Erasmo Leventhal , PTA Acute Rehabilitation Services Pager [phone] Office [phone]     Cory [name] 06/27/2020, 3:35 PM

## 2020-06-27 NOTE — Progress Notes (Signed)
PHARMACIST LIPID MONITORING   Cory [name] is a 74 y.o. male admitted on 06/25/2020 with PVD.  Pharmacy has been consulted to optimize lipid-lowering therapy with the indication of secondary prevention for clinical ASCVD.  Recent Labs:  Lipid Panel (last 6 months):   Lab Results  Component Value Date   CHOL 108 06/26/2020   TRIG 44 06/26/2020   HDL 49 06/26/2020   CHOLHDL 2.2 06/26/2020   VLDL 9 06/26/2020   LDLCALC 50 06/26/2020    Hepatic function panel (last 6 months):   Lab Results  Component Value Date   AST 10 (L) 06/26/2020   ALT 13 06/26/2020   ALKPHOS 24 (L) 06/26/2020   BILITOT 0.3 06/26/2020   BILIDIR 0.1 06/26/2020   IBILI 0.2 (L) 06/26/2020    SCr (since admission):   Serum creatinine: 0.78 mg/dL 06/26/20 0540 Estimated creatinine clearance: 96.4 mL/min  Current lipid-lowering therapy: crestor 10mg /day Previous lipid-lowering therapies (if applicable): none noted Documented or reported allergies or intolerances to lipid-lowering therapies (if applicable): none  Assessment:  Patient agrees with changes to lipid-lowering therapy  Recommendation per protocol:  Increase intensity or dose of current statin.  Follow-up with:  Primary care provider - [name], MD  Follow-up labs after discharge:    Liver function panel and lipid panel in 8-12 weeks then annually  Plan: -Increase crestor to 20mg /d  Hildred Laser, PharmD Clinical Pharmacist **Pharmacist phone directory can now be found on [email or web address] ([email or web address]).  Listed under [place].

## 2020-06-27 NOTE — Progress Notes (Signed)
   VASCULAR SURGERY ASSESSMENT & PLAN:   POD 2 REPAIR OF RIGHT COMMON FEMORAL ARTERY AND EVACUATION OF LARGE HEMATOMA: The patient continues to have moderate output from the [place].  I think this is from that large pocket that was evacuated.  However I think we do need to continue the JP for now.  The wife does not feel comfortable with the patient going home with a JP so we will keep him another day.  Okay to mobilize.   SUBJECTIVE:   No specific complaints.  PHYSICAL EXAM:   Vitals:   06/26/20 1650 06/26/20 2026 06/27/20 0019 06/27/20 0408  BP: 111/67 117/73 139/80 121/81  Pulse: 71 71 75   Resp: 15 18 17 18   Temp: 98.2 F (36.8 C) 98.2 F (36.8 C) 97.6 F (36.4 C) 97.8 F (36.6 C)  TempSrc: Oral Oral Oral Oral  SpO2: 93% 94% 94% 95%  Weight:      Height:       His incision looks fine. 125 cc out his JP last shift.  LABS:   Lab Results  Component Value Date   WBC 13.4 (H) 06/26/2020   HGB 10.1 (L) 06/26/2020   HCT 30.8 (L) 06/26/2020   MCV 98.7 06/26/2020   PLT 151 06/26/2020   Lab Results  Component Value Date   CREATININE 0.78 06/26/2020   Lab Results  Component Value Date   INR 1.1 06/25/2020    PROBLEM LIST:    Active Problems:   PAD (peripheral artery disease) (HCC)   CURRENT MEDS:   . sodium chloride   Intravenous Once  . cholecalciferol  1,000 Units Oral Daily  . diltiazem  120 mg Oral Daily  . docusate sodium  100 mg Oral Daily  . loratadine  10 mg Oral Daily  . pantoprazole  40 mg Oral Daily  . rosuvastatin  10 mg Oral QPM    Deitra Mayo Office: [phone] 06/27/2020

## 2020-06-28 MED ORDER — OXYCODONE-ACETAMINOPHEN 5-325 MG PO TABS
1.0000 | ORAL_TABLET | ORAL | 0 refills | Status: DC | PRN
Start: 2020-06-28 — End: 2020-08-29

## 2020-06-28 MED ORDER — ROSUVASTATIN CALCIUM 20 MG PO TABS
20.0000 mg | ORAL_TABLET | Freq: Every evening | ORAL | Status: DC
Start: 2020-06-28 — End: 2020-11-11

## 2020-06-28 NOTE — Discharge Summary (Signed)
Discharge Summary  Patient ID: Cory [name] [id number] 74 y.o. Mar 24, 1946  Admit date: 06/25/2020  Discharge date and time: No discharge date for patient encounter.   Admitting Physician: Angelia Mould, MD   Discharge Physician: Deitra Mayo, MD  Admission Diagnoses: PAD (peripheral artery disease) [facility]) [I73.9]  Discharge Diagnoses: peripheral artery disease, hematoma of right groin  Admission Condition: fair  Discharged Condition: good  Indication for Admission: expanding hematoma of the right groin due to bleeding from right common femoral artery secondary to catheterization performed day prior for coil embolization of the right internal iliac artery  Hospital Course: 74 y.o. male who presented to the ED on 06/25/20 with expanding hematoma of the right groin. He was 1 day s/p angiogram with coil embolization of the right internal iliac artery by Dr. Oneida Alar. He had gone home and experienced a popping sensation in the groin and that's when he noticed increased swelling. He presented with hypotension. He was taken emergently to the OR for right groin exploration and right femoral artery repair by Dr. Scot Dock. He tolerated the procedure well and was taken to the PACU in stable condition. He remained stable in PACU and was transferred to the floor in stable condition  POD#1 he continued to do well post op. Right groin incision clean, dry and intact. Ecchymosis of the thigh present. Right lower extremity well perfused with AT/ PT doppler signals. JP drain with significant output. He did not require transfusion post operatively. Hemodynamically stable. Felt he would benefit from continued observation for another day or two due to significant drain output. Tolerating ambulation and diet  POD#2 pain well controlled. Right groin incision clean, dry and intact. Soft with ecchymosis present. No signs of bleeding. JP still with significant output. Encouraged continued  mobilization. Remained hemodynamically stable  POD#3 right lower extremity remained well perfused. Right groin incision intact. No swelling of right thigh. Ecchymosis stable. JP drain output greatly decreased so this was taken out. Patient tolerating ambulation. Patient otherwise stable for discharge home. PDMP was reviewed and pain medication was sent to patients pharmacy. His Crestor was increased from 10 mg to 20 mg per pharmacy recommendation and his new prescription was sent to his pharmacy. He will otherwise resume his normal home medications. He was instructed to re start his Xarelto 06/29/20. He has follow up with Dr. Oneida Alar in 1-2 weeks for incision check and to discuss further timing of patients EVAR  Consults: None  Treatments: surgery: Repair of right common femoral artery and evacuation of hematoma of right groin   Disposition: Home There are no questions and answers to display.        Patient Instructions:  Allergies as of 06/28/2020      Reactions   Azithromycin Diarrhea   2,4-d Dimethylamine (amisol) Other (See Comments), Itching      Medication List    TAKE these medications   acetaminophen 500 MG tablet Commonly known as: TYLENOL Take 500-1,000 mg by mouth every 6 (six) hours as needed (for pain.).   cetirizine 10 MG tablet Commonly known as: ZYRTEC Take 1 tablet (10 mg total) by mouth daily. What changed:   when to take this  reasons to take this   cholecalciferol 25 MCG (1000 UNIT) tablet Commonly known as: VITAMIN D Take 1,000 Units by mouth daily.   diltiazem 120 MG 24 hr capsule Commonly known as: TIAZAC Take 1 capsule by mouth once daily   fluticasone 50 MCG/ACT nasal spray Commonly known as: FLONASE Place 2  sprays into both nostrils daily. What changed:   when to take this  reasons to take this   metoprolol tartrate 25 MG tablet Commonly known as: LOPRESSOR Take 1 tablet by mouth twice daily as needed What changed: reasons to take  this   oxyCODONE-acetaminophen 5-325 MG tablet Commonly known as: PERCOCET/ROXICET Take 1-2 tablets by mouth every 4 (four) hours as needed for moderate pain.   rivaroxaban 20 MG Tabs tablet Commonly known as: Xarelto Take 1 tablet (20 mg total) by mouth daily with supper.   rosuvastatin 20 MG tablet Commonly known as: CRESTOR Take 1 tablet (20 mg total) by mouth every evening. What changed:   medication strength  how much to take  when to take this      Activity: activity as tolerated, no driving while on analgesics and no heavy lifting for 6 weeks Diet: regular diet Wound Care: keep wound clean and dry  Follow-up with Dr. Oneida Alar in 1-2 weeks.  SignedKaroline Caldwell, PA-C 06/28/2020 12:33 PM [facility] Office: [phone]

## 2020-06-28 NOTE — Anesthesia Postprocedure Evaluation (Signed)
Anesthesia Post Note  Patient: Cory [name]  Procedure(s) Performed: RIGHT GROIN EXPLORATION Evacuation of Hematoma  WITH REPAIR OF RIGHT Common FEMORAL ARTERY. (Right Groin)     Patient location during evaluation: PACU Anesthesia Type: General Level of consciousness: awake and alert Pain management: pain level controlled Vital Signs Assessment: post-procedure vital signs reviewed and stable Respiratory status: spontaneous breathing, nonlabored ventilation, respiratory function stable and patient connected to nasal cannula oxygen Cardiovascular status: blood pressure returned to baseline and stable Postop Assessment: no apparent nausea or vomiting Anesthetic complications: no   No complications documented.  Last Vitals:  Vitals:   06/28/20 0807 06/28/20 1100  BP: 137/70 (!) 110/57  Pulse: 74 79  Resp: 18 20  Temp: 36.6 C 36.4 C  SpO2: 95% 95%    Last Pain:  Vitals:   06/28/20 1100  TempSrc: Oral  PainSc:                  [place]

## 2020-06-28 NOTE — Progress Notes (Signed)
Discharge instructions given to patient. IV removed, clean and intact. JP drain removed per order. Medications and wound care reviewed. All questions answered. Pt escorted home with wife.  Arletta Bale, RN

## 2020-06-28 NOTE — Discharge Instructions (Signed)
[facility]  Discharge instructions  Lower Extremity Arterial Surgery  Please refer to the following instruction for your post-procedure care. Your surgeon or physician assistant will discuss any changes with you.  Activity  You are encouraged to walk as much as you can. You can slowly return to normal activities during the month after your surgery. Avoid strenuous activity and heavy lifting until your doctor tells you it's OK. Avoid activities such as vacuuming or swinging a golf club. Do not drive until your doctor give the OK and you are no longer taking prescription pain medications. It is also normal to have difficulty with sleep habits, eating and bowel movement after surgery. These will go away with time.  Bathing/Showering  Shower daily after you go home. Do not soak in a bathtub, hot tub, or swim until the incision heals completely.  Incision Care  Clean your incision with mild soap and water. Shower every day. Pat the area dry with a clean towel. You do not need a bandage unless otherwise instructed. Do not apply any ointments or creams to your incision. If you have open wounds you will be instructed how to care for them or a visiting nurse may be arranged for you. If you have staples or sutures along your incision they will be removed at your post-op appointment. You may have skin glue on your incision. Do not peel it off. It will come off on its own in about one week.  Wash the groin wound with soap and water daily and pat dry. (No tub bath-only shower)  Then put a dry gauze or washcloth in the groin to keep this area dry to help prevent wound infection.  Do this daily and as needed.  Do not use Vaseline or neosporin on your incisions.  Only use soap and water on your incisions and then protect and keep dry.  Diet  Resume your normal diet. There are no special food restrictions following this procedure. A low fat/ low cholesterol diet is  recommended for all patients with vascular disease. In order to heal from your surgery, it is CRITICAL to get adequate nutrition. Your body requires vitamins, minerals, and protein. Vegetables are the best source of vitamins and minerals. Vegetables also provide the perfect balance of protein. Processed food has little nutritional value, so try to avoid this.  Medications  Resume taking all your medications unless your doctor or physician assistant tells you not to. If your incision is causing pain, you may take over-the-counter pain relievers such as acetaminophen (Tylenol). If you were prescribed a stronger pain medication, please aware these medication can cause nausea and constipation. Prevent nausea by taking the medication with a snack or meal. Avoid constipation by drinking plenty of fluids and eating foods with high amount of fiber, such as fruits, vegetables, and grains. Take Colace 100 mg (an over-the-counter stool softener) twice a day as needed for constipation.   Do not take Tylenol if you are taking prescription pain medications.  Follow Up  Our office will schedule a follow up appointment 2-3 weeks following discharge.  Please call us immediately for any of the following conditions  Severe or worsening pain in your legs or feet while at rest or while walking Increase pain, redness, warmth, or drainage (pus) from your incision site(s)  Fever of 101 degree or higher  The swelling in your leg with the bypass suddenly worsens and becomes more painful than when you were in the hospital  If you have been instructed to feel your graft pulse then you should do so every day. If you can no longer feel this pulse, call the office immediately. Not all patients are given this instruction.   Leg swelling is common after leg bypass surgery.  The swelling should improve over a few months following surgery. To improve the swelling, you may elevate your legs above the level of your heart  while you are sitting or resting. Your surgeon or physician assistant may ask you to apply an ACE wrap or wear compression (TED) stockings to help to reduce swelling.  Reduce your risk of vascular disease  Stop smoking. If you would like help call [facility] at 1-800-QUIT-NOW 717-651-5042) or [facility] at (269)414-3897.   Manage your cholesterol  Maintain a desired weight  Control your diabetes weight  Control your diabetes  Keep your blood pressure down   If you have any questions, please call the office at [phone]

## 2020-06-28 NOTE — Progress Notes (Signed)
  Progress Note    06/28/2020 7:46 AM 3 Days Post-Op  Subjective:  No complaints this morning. Eager to go home   Vitals:   06/27/20 2331 06/28/20 0359  BP: 136/74 123/77  Pulse:    Resp: 19 18  Temp: 98.2 F (36.8 C) 98.2 F (36.8 C)  SpO2: 95% 94%   Physical Exam: Cardiac:  regular Lungs: non labored Incisions:  Right groin incision is clean dry and intact. Ecchymosis present around incision and extending into proximal right thigh and scrotum. Right thigh/groin soft. JP drain with <10 cc serosanguinous output Extremities:  Well perfused and warm. Doppler DP and PT signals bilaterally Neurologic: alert and oriented  CBC    Component Value Date/Time   WBC 13.4 (H) 06/26/2020 0540   RBC 3.12 (L) 06/26/2020 0540   HGB 10.1 (L) 06/26/2020 0540   HGB 16.2 05/08/2019 1115   HCT 30.8 (L) 06/26/2020 0540   HCT 49.1 05/08/2019 1115   PLT 151 06/26/2020 0540   PLT CANCELED 05/08/2019 1115   MCV 98.7 06/26/2020 0540   MCV 98 (H) 05/08/2019 1115   MCH 32.4 06/26/2020 0540   MCHC 32.8 06/26/2020 0540   RDW 13.0 06/26/2020 0540   RDW 12.0 05/08/2019 1115   LYMPHSABS 2.5 04/01/2013 1248   MONOABS 0.7 04/01/2013 1248   EOSABS 0.2 04/01/2013 1248   BASOSABS 0.0 04/01/2013 1248    BMET    Component Value Date/Time   NA 135 06/26/2020 0540   NA 141 05/16/2018 1425   K 4.2 06/26/2020 0540   CL 107 06/26/2020 0540   CO2 24 06/26/2020 0540   GLUCOSE 143 (H) 06/26/2020 0540   BUN 7 (L) 06/26/2020 0540   BUN 13 05/16/2018 1425   CREATININE 0.78 06/26/2020 0540   CALCIUM 7.9 (L) 06/26/2020 0540   GFRNONAA >60 06/26/2020 0540   GFRAA 94 05/16/2018 1425    INR    Component Value Date/Time   INR 1.1 06/25/2020 1030     Intake/Output Summary (Last 24 hours) at 06/28/2020 0746 Last data filed at 06/28/2020 0720 Gross per 24 hour  Intake 240 ml  Output 375 ml  Net -135 ml     Assessment/Plan:  74 y.o. male is s/p repair of right common femoral artery and  evacuation of large hematoma following catheterization for coil embolization of right internal iliac artery 3 Days Post-Op. JP output had significantly decreased. 30cc in 24 hours.Will discontinue JP drain. Mobilize this morning. Patient is tentatively scheduled for EVAR on 07/11/20. Will defer to Dr. Scot Dock for timing of this. He is otherwise stable for  discharge home later today  DVT prophylaxis: Heparin    Karoline Caldwell, PA-C [facility] [phone] 06/28/2020 7:46 AM

## 2020-06-29 LAB — TYPE AND SCREEN
ABO/RH(D): A POS
Antibody Screen: NEGATIVE
Unit division: 0
Unit division: 0

## 2020-06-29 LAB — BPAM RBC
Blood Product Expiration Date: 202112162359
Blood Product Expiration Date: 202112162359
ISSUE DATE / TIME: 202111201243
ISSUE DATE / TIME: 202111201243
Unit Type and Rh: 6200
Unit Type and Rh: 6200

## 2020-07-07 ENCOUNTER — Encounter: Payer: Self-pay | Admitting: Vascular Surgery

## 2020-07-07 ENCOUNTER — Ambulatory Visit (INDEPENDENT_AMBULATORY_CARE_PROVIDER_SITE_OTHER): Payer: Self-pay | Admitting: Vascular Surgery

## 2020-07-07 ENCOUNTER — Other Ambulatory Visit: Payer: Self-pay

## 2020-07-07 VITALS — BP 115/76 | HR 73 | Temp 98.4°F | Resp 20 | Ht 76.0 in | Wt 220.0 lb

## 2020-07-07 DIAGNOSIS — I714 Abdominal aortic aneurysm, without rupture, unspecified: Secondary | ICD-10-CM

## 2020-07-07 NOTE — Progress Notes (Signed)
Patient is a 74 year old male who returns for follow-up today.  He recently underwent coil embolization of the right internal iliac artery.  This was complicated by hematoma in his left groin postoperatively and required operative repair.  He returns today for further follow-up.  He has no incisional drainage.  He still has some ecchymosis down the right inner leg and in the right posterior medial thigh.  He also still has some swelling in the right leg.  Physical exam:  Vitals:   07/07/20 1533  BP: 115/76  Pulse: 73  Resp: 20  Temp: 98.4 F (36.9 C)  SpO2: 93%  Weight: 220 lb (99.8 kg)  Height: 6\' 4"  (1.93 m)    Extremities: Healing right groin incision, 2+ femoral pulse  Assessment: Continuing to heal from recent evacuation of hematoma right groin.  Patient was scheduled to have repair of his aortic and iliac aneurysms with stent graft on Monday.  We will delay this for 1 month to allow his groin to fully heal.  He will follow up with me in 1 month and we will reschedule his aneurysm repair after that.  Plan: See above  [name], MD [facility] [place] Office: [phone]

## 2020-07-08 ENCOUNTER — Other Ambulatory Visit (HOSPITAL_COMMUNITY): Payer: Medicare HMO

## 2020-07-11 ENCOUNTER — Encounter (HOSPITAL_COMMUNITY): Admission: RE | Payer: Self-pay | Source: Home / Self Care

## 2020-07-11 ENCOUNTER — Inpatient Hospital Stay (HOSPITAL_COMMUNITY): Admission: RE | Admit: 2020-07-11 | Payer: Medicare HMO | Source: Home / Self Care | Admitting: Vascular Surgery

## 2020-07-11 SURGERY — INSERTION, ENDOVASCULAR STENT GRAFT, AORTA, ABDOMINAL
Anesthesia: General

## 2020-08-04 ENCOUNTER — Encounter: Payer: Medicare HMO | Admitting: Vascular Surgery

## 2020-08-07 ENCOUNTER — Other Ambulatory Visit: Payer: Self-pay | Admitting: Cardiology

## 2020-08-11 ENCOUNTER — Other Ambulatory Visit: Payer: Self-pay

## 2020-08-11 ENCOUNTER — Ambulatory Visit: Payer: Self-pay | Admitting: Vascular Surgery

## 2020-08-11 ENCOUNTER — Ambulatory Visit (HOSPITAL_COMMUNITY)
Admission: RE | Admit: 2020-08-11 | Discharge: 2020-08-11 | Disposition: A | Discharge status: Home / Self Care | Payer: Medicare HMO | Source: Ambulatory Visit | Attending: Family Medicine | Admitting: Family Medicine

## 2020-08-11 ENCOUNTER — Encounter: Payer: Self-pay | Admitting: Vascular Surgery

## 2020-08-11 ENCOUNTER — Encounter: Payer: Self-pay | Admitting: *Deleted

## 2020-08-11 ENCOUNTER — Other Ambulatory Visit: Payer: Self-pay | Admitting: *Deleted

## 2020-08-11 VITALS — BP 156/92 | HR 77 | Temp 98.3°F | Resp 20 | Ht 76.0 in | Wt 228.0 lb

## 2020-08-11 DIAGNOSIS — M7989 Other specified soft tissue disorders: Secondary | ICD-10-CM

## 2020-08-11 NOTE — Progress Notes (Signed)
Patient is a 74-year-old male who returns for follow-up today.  He has known abdominal aortic aneurysm.  He underwent coil embolization of his right internal iliac artery approximately 1 month ago.  This was complicated by hematoma in his left groin which required operative repair.  He returns today for further follow-up.  Previous CT scan showed a 3.8 cm abdominal aortic aneurysm with 2 right renal arteries and a single left renal artery aneurysm neck is about 2.8 cm in diameter.  There was also a 4.5 cm right common iliac aneurysm and a 3.3 cm right internal iliac aneurysm which has now been embolized.  Left iliac system had mild to moderate tortuosity but was patent without aneurysm.  Right lower extremity had posterior tibial peroneal runoff with no popliteal aneurysm.  Left popliteal artery was occluded with runoff by the posterior tibial and peroneal arteries.  He also has a history of left leg claudication with an ABI on the left of 0.5.  He has a chronically occluded left popliteal aneurysm.  He is on Xarelto for atrial fibrillation.  He is also on a statin.   Physical exam:  Vitals:   08/11/20 1022  BP: (!) 156/92  Pulse: 77  Resp: 20  Temp: 98.3 F (36.8 C)  SpO2: 94%  Weight: 228 lb (103.4 kg)  Height: 6' 4" (1.93 m)   Extremities: 2+ femoral pulses bilaterally, diffuse edema right leg extending from the hip all the way to the foot, healed right groin incision Abdomen: Soft nontender  Data: Patient had duplex ultrasound of his right leg today which showed no evidence of DVT.  Assessment: Right groin incision now healed from recent hematoma.  We will proceed with aortic aneurysm and iliac aneurysm stent graft repair.  This is scheduled for August 29, 2020.  Plan: Aortic stent graft August 29, 2020.  Patient will wear compression stocking on his right leg to control edema symptoms.  Long-term after we have his aneurysm repair performed we will concentrate on the claudication  symptoms in his left leg related to his thrombosed popliteal aneurysm.  Latasha Buczkowski, MD [facility] [place] Office: [phone]    

## 2020-08-11 NOTE — H&P (View-Only) (Signed)
Cory [name] is a 75 year old male who returns for follow-up today.  He has known abdominal aortic aneurysm.  He underwent coil embolization of his right internal iliac artery approximately 1 month ago.  This was complicated by hematoma in his left groin which required operative repair.  He returns today for further follow-up.  Previous CT scan showed a 3.8 cm abdominal aortic aneurysm with 2 right renal arteries and a single left renal artery aneurysm neck is about 2.8 cm in diameter.  There was also a 4.5 cm right common iliac aneurysm and a 3.3 cm right internal iliac aneurysm which has now been embolized.  Left iliac system had mild to moderate tortuosity but was patent without aneurysm.  Right lower extremity had posterior tibial peroneal runoff with no popliteal aneurysm.  Left popliteal artery was occluded with runoff by the posterior tibial and peroneal arteries.  He also has a history of left leg claudication with an ABI on the left of 0.5.  He has a chronically occluded left popliteal aneurysm.  He is on Xarelto for atrial fibrillation.  He is also on a statin.   Physical exam:  Vitals:   08/11/20 1022  BP: (!) 156/92  Pulse: 77  Resp: 20  Temp: 98.3 F (36.8 C)  SpO2: 94%  Weight: 228 lb (103.4 kg)  Height: 6\' 4"  (1.93 m)   Extremities: 2+ femoral pulses bilaterally, diffuse edema right leg extending from the hip all the way to the foot, healed right groin incision Abdomen: Soft nontender  Data: Cory [name] had duplex ultrasound of his right leg today which showed no evidence of DVT.  Assessment: Right groin incision now healed from recent hematoma.  We will proceed with aortic aneurysm and iliac aneurysm stent graft repair.  This is scheduled for August 29, 2020.  Plan: Aortic stent graft August 29, 2020.  Cory [name] will wear compression stocking on his right leg to control edema symptoms.  Long-term after we have his aneurysm repair performed we will concentrate on the claudication  symptoms in his left leg related to his thrombosed popliteal aneurysm.  August 31, 2020, MD [facility] [place] Office: [phone]

## 2020-08-17 ENCOUNTER — Other Ambulatory Visit: Payer: Self-pay | Admitting: *Deleted

## 2020-08-17 DIAGNOSIS — I714 Abdominal aortic aneurysm, without rupture, unspecified: Secondary | ICD-10-CM

## 2020-08-23 NOTE — Progress Notes (Signed)
Your procedure is scheduled on Monday, January 24th.  Report to [facility] Main Entrance "A" at 5:30 A.M., and check in at the Admitting office.  Call this number if you have problems the morning of surgery:  [phone]  Call [phone] if you have any questions prior to your surgery date Monday-Friday 8am-4pm   Remember:  Do not eat after midnight the night before your surgery   Take these medicines the morning of surgery with A SIP OF WATER diltiazem (TIAZAC)  rosuvastatin (CRESTOR)   If needed - acetaminophen (TYLENOL), cetirizine (ZYRTEC), fluticasone (FLONASE)/nasal spray    Follow your surgeon's instructions on when to stop rivaroxaban (XARELTO).  If no instructions were given by your surgeon then you will need to call the office to get those instructions.    As of today, STOP taking any Aspirin (unless otherwise instructed by your surgeon) Aleve, Naproxen, Ibuprofen, Motrin, Advil, Goody's, BC's, all herbal medications, fish oil, and all vitamins.                     Do not wear jewelry.            Do not wear lotions, powders, colognes, or deodorant.            Men may shave face and neck.            Do not bring valuables to the hospital.            [facility] is not responsible for any belongings or valuables.  Do NOT Smoke (Tobacco/Vaping) or drink Alcohol 24 hours prior to your procedure If you use a CPAP at night, you may bring all equipment for your overnight stay.   Contacts, glasses, dentures or bridgework may not be worn into surgery.      For patients admitted to the hospital, discharge time will be determined by your treatment team.   Patients discharged the day of surgery will not be allowed to drive home, and someone needs to stay with them for 24 hours.  Special instructions:   [facility]- Preparing For Surgery  Before surgery, you can play an important role. Because skin is not sterile, your skin needs to be as free of germs as possible. You can  reduce the number of germs on your skin by washing with CHG (chlorahexidine gluconate) Soap before surgery.  CHG is an antiseptic cleaner which kills germs and bonds with the skin to continue killing germs even after washing.    Oral Hygiene is also important to reduce your risk of infection.  Remember - BRUSH YOUR TEETH THE MORNING OF SURGERY WITH YOUR REGULAR TOOTHPASTE  Please do not use if you have an allergy to CHG or antibacterial soaps. If your skin becomes reddened/irritated stop using the CHG.  Do not shave (including legs and underarms) for at least 48 hours prior to first CHG shower. It is OK to shave your face.  Please follow these instructions carefully.   1. Shower the NIGHT BEFORE SURGERY and the MORNING OF SURGERY with CHG Soap.   2. If you chose to wash your hair, wash your hair first as usual with your normal shampoo.  3. After you shampoo, rinse your hair and body thoroughly to remove the shampoo.  4. Use CHG as you would any other liquid soap. You can apply CHG directly to the skin and wash gently with a scrungie or a clean washcloth.   5. Apply the CHG Soap to your  body ONLY FROM THE NECK DOWN.  Do not use on open wounds or open sores. Avoid contact with your eyes, ears, mouth and genitals (private parts). Wash Face and genitals (private parts)  with your normal soap.   6. Wash thoroughly, paying special attention to the area where your surgery will be performed.  7. Thoroughly rinse your body with warm water from the neck down.  8. DO NOT shower/wash with your normal soap after using and rinsing off the CHG Soap.  9. Pat yourself dry with a CLEAN TOWEL.  10. Wear CLEAN PAJAMAS to bed the night before surgery  11. Place CLEAN SHEETS on your bed the night of your first shower and DO NOT SLEEP WITH PETS.  Day of Surgery: Wear Clean/Comfortable clothing the morning of surgery Do not apply any deodorants/lotions.   Remember to brush your teeth WITH YOUR REGULAR  TOOTHPASTE.   Please read over the following fact sheets that you were given.

## 2020-08-24 ENCOUNTER — Telehealth: Payer: Self-pay

## 2020-08-24 ENCOUNTER — Other Ambulatory Visit: Payer: Self-pay

## 2020-08-24 ENCOUNTER — Encounter (HOSPITAL_COMMUNITY)
Admission: RE | Admit: 2020-08-24 | Discharge: 2020-08-24 | Disposition: A | Discharge status: Home / Self Care | Payer: Medicare HMO | Source: Ambulatory Visit | Attending: Vascular Surgery | Admitting: Vascular Surgery

## 2020-08-24 ENCOUNTER — Encounter (HOSPITAL_COMMUNITY): Payer: Self-pay

## 2020-08-24 DIAGNOSIS — Z01812 Encounter for preprocedural laboratory examination: Secondary | ICD-10-CM | POA: Diagnosis not present

## 2020-08-24 HISTORY — DX: Unspecified osteoarthritis, unspecified site: M19.90

## 2020-08-24 LAB — APTT: aPTT: 33 seconds (ref 24–36)

## 2020-08-24 LAB — COMPREHENSIVE METABOLIC PANEL
ALT: 19 U/L (ref 0–44)
AST: 16 U/L (ref 15–41)
Albumin: 3.5 g/dL (ref 3.5–5.0)
Alkaline Phosphatase: 42 U/L (ref 38–126)
Anion gap: 9 (ref 5–15)
BUN: 13 mg/dL (ref 8–23)
CO2: 24 mmol/L (ref 22–32)
Calcium: 8.9 mg/dL (ref 8.9–10.3)
Chloride: 104 mmol/L (ref 98–111)
Creatinine, Ser: 1 mg/dL (ref 0.61–1.24)
GFR, Estimated: >60 mL/min (ref >60)
Glucose, Bld: 103 mg/dL — ABNORMAL HIGH (ref 70–99)
Potassium: 4 mmol/L (ref 3.5–5.1)
Sodium: 137 mmol/L (ref 135–145)
Total Bilirubin: 0.6 mg/dL (ref 0.3–1.2)
Total Protein: 6.8 g/dL (ref 6.5–8.1)

## 2020-08-24 LAB — URINALYSIS, ROUTINE W REFLEX MICROSCOPIC
Bilirubin Urine: NEGATIVE
Glucose, UA: NEGATIVE mg/dL
Ketones, ur: NEGATIVE mg/dL
Nitrite: NEGATIVE
Protein, ur: NEGATIVE mg/dL
Specific Gravity, Urine: 1.014 (ref 1.005–1.030)
pH: 7 (ref 5.0–8.0)

## 2020-08-24 LAB — TYPE AND SCREEN
ABO/RH(D): A POS
Antibody Screen: NEGATIVE

## 2020-08-24 LAB — CBC
HCT: 48.6 % (ref 39.0–52.0)
Hemoglobin: 15.7 g/dL (ref 13.0–17.0)
MCH: 31.3 pg (ref 26.0–34.0)
MCHC: 32.3 g/dL (ref 30.0–36.0)
MCV: 96.8 fL (ref 80.0–100.0)
Platelets: 231 10*3/uL (ref 150–400)
RBC: 5.02 MIL/uL (ref 4.22–5.81)
RDW: 13 % (ref 11.5–15.5)
WBC: 7 10*3/uL (ref 4.0–10.5)
nRBC: 0 % (ref 0.0–0.2)

## 2020-08-24 LAB — PROTIME-INR
INR: 1.1 (ref 0.8–1.2)
Prothrombin Time: 14 seconds (ref 11.4–15.2)

## 2020-08-24 LAB — SURGICAL PCR SCREEN
MRSA, PCR: NEGATIVE
Staphylococcus aureus: NEGATIVE

## 2020-08-24 MED ORDER — CIPROFLOXACIN HCL 500 MG PO TABS: 500.0000 mg | ORAL_TABLET | Freq: Two times a day (BID) | ORAL | 0 refills | Status: DC

## 2020-08-24 NOTE — Progress Notes (Signed)
Notified Dr. Oneida Alar surgery scheduler of Urinalysis results (moderate amount of leukocytes).

## 2020-08-24 NOTE — Progress Notes (Addendum)
Your procedure is scheduled on Monday, January 24th.  Report to [facility] Main Entrance "A" at 5:30 A.M., and check in at the Admitting office.  Call this number if you have problems the morning of surgery:  [phone]  Call [phone] if you have any questions prior to your surgery date Monday-Friday 8am-4pm   Remember:  Do not eat or drink after midnight the night before your surgery   Take these medicines the morning of surgery with A SIP OF WATER diltiazem (TIAZAC)  rosuvastatin (CRESTOR)   If needed - acetaminophen (TYLENOL), cetirizine (ZYRTEC), fluticasone (FLONASE)/nasal spray , metoprolol tartrate (LOPRESSOR).  Hold rivaroxaban (XARELTO) prior to surgery. Stop taking rivaroxaban (XARELTO) on 08/26/20.    As of today, STOP taking any Aspirin (unless otherwise instructed by your surgeon) Aleve, Naproxen, Ibuprofen, Motrin, Advil, Goody's, BC's, all herbal medications, fish oil, and all vitamins.                     Do not wear jewelry.            Do not wear lotions, powders, colognes, or deodorant.            Men may shave face and neck.            Do not bring valuables to the hospital.            [facility] is not responsible for any belongings or valuables.  Do NOT Smoke (Tobacco/Vaping) or drink Alcohol 24 hours prior to your procedure If you use a CPAP at night, you may bring all equipment for your overnight stay.   Contacts, glasses, dentures or bridgework may not be worn into surgery.      For patients admitted to the hospital, discharge time will be determined by your treatment team.   Patients discharged the day of surgery will not be allowed to drive home, and someone needs to stay with them for 24 hours.  Special instructions:   [facility]- Preparing For Surgery  Before surgery, you can play an important role. Because skin is not sterile, your skin needs to be as free of germs as possible. You can reduce the number of germs on your skin by washing with  CHG (chlorahexidine gluconate) Soap before surgery.  CHG is an antiseptic cleaner which kills germs and bonds with the skin to continue killing germs even after washing.    Oral Hygiene is also important to reduce your risk of infection.  Remember - BRUSH YOUR TEETH THE MORNING OF SURGERY WITH YOUR REGULAR TOOTHPASTE  Please do not use if you have an allergy to CHG or antibacterial soaps. If your skin becomes reddened/irritated stop using the CHG.  Do not shave (including legs and underarms) for at least 48 hours prior to first CHG shower. It is OK to shave your face.  Please follow these instructions carefully.   1. Shower the NIGHT BEFORE SURGERY and the MORNING OF SURGERY with CHG Soap.   2. If you chose to wash your hair, wash your hair first as usual with your normal shampoo.  3. After you shampoo, rinse your hair and body thoroughly to remove the shampoo.  4. Use CHG as you would any other liquid soap. You can apply CHG directly to the skin and wash gently with a scrungie or a clean washcloth.   5. Apply the CHG Soap to your body ONLY FROM THE NECK DOWN.  Do not use on open wounds or open  sores. Avoid contact with your eyes, ears, mouth and genitals (private parts). Wash Face and genitals (private parts)  with your normal soap.   6. Wash thoroughly, paying special attention to the area where your surgery will be performed.  7. Thoroughly rinse your body with warm water from the neck down.  8. DO NOT shower/wash with your normal soap after using and rinsing off the CHG Soap.  9. Pat yourself dry with a CLEAN TOWEL.  10. Wear CLEAN PAJAMAS to bed the night before surgery  11. Place CLEAN SHEETS on your bed the night of your first shower and DO NOT SLEEP WITH PETS.  Day of Surgery: Wear Clean/Comfortable clothing the morning of surgery Do not apply any deodorants/lotions.   Remember to brush your teeth WITH YOUR REGULAR TOOTHPASTE.   Please read over the following fact sheets  that you were given.

## 2020-08-24 NOTE — Progress Notes (Signed)
PCP - Carolann Littler Cardiologist - Dr. Percival Spanish  PPM/ICD - denies   Chest x-ray - n/a EKG - 05/13/2020 Stress Test - 2016 ECHO - 05/31/2020 Cardiac Cath - denies  Sleep Study - denies  Patient instructed to hold all Aspirin, NSAID's, herbal medications, fish oil and vitamins 7 days prior to surgery.   ERAS Protcol -no    COVID TEST- scheduled for 08/26/20   Anesthesia review: yes, cardiac history. Pt states he received clearance from Dr. Percival Spanish with his last surgery.   Patient denies shortness of breath, fever, cough and chest pain at PAT appointment   All instructions explained to the patient, with a verbal understanding of the material. Patient agrees to go over the instructions while at home for a better understanding. Patient also instructed to self quarantine after being tested for COVID-19. The opportunity to ask questions was provided.

## 2020-08-24 NOTE — Telephone Encounter (Signed)
Spoke with pt regarding pre-admission testing results:  U/A showed mod leukocytes. Per Dr. Oneida Alar, will send Rx for ciprofloxacin 500 mg PO bid x 7 days to pharmacy. Pt verbalized understanding.

## 2020-08-26 ENCOUNTER — Other Ambulatory Visit (HOSPITAL_COMMUNITY)
Admission: RE | Admit: 2020-08-26 | Discharge: 2020-08-26 | Disposition: A | Discharge status: Home / Self Care | Payer: Medicare HMO | Source: Ambulatory Visit | Attending: Vascular Surgery | Admitting: Vascular Surgery

## 2020-08-26 DIAGNOSIS — Z20822 Contact with and (suspected) exposure to covid-19: Secondary | ICD-10-CM | POA: Insufficient documentation

## 2020-08-26 DIAGNOSIS — Z7901 Long term (current) use of anticoagulants: Secondary | ICD-10-CM | POA: Diagnosis not present

## 2020-08-26 DIAGNOSIS — I350 Nonrheumatic aortic (valve) stenosis: Secondary | ICD-10-CM | POA: Diagnosis present

## 2020-08-26 DIAGNOSIS — Z79899 Other long term (current) drug therapy: Secondary | ICD-10-CM | POA: Diagnosis not present

## 2020-08-26 DIAGNOSIS — Z01812 Encounter for preprocedural laboratory examination: Secondary | ICD-10-CM | POA: Insufficient documentation

## 2020-08-26 DIAGNOSIS — I700 Atherosclerosis of aorta: Secondary | ICD-10-CM | POA: Diagnosis not present

## 2020-08-26 DIAGNOSIS — Z9889 Other specified postprocedural states: Secondary | ICD-10-CM | POA: Diagnosis not present

## 2020-08-26 DIAGNOSIS — I722 Aneurysm of renal artery: Secondary | ICD-10-CM | POA: Diagnosis present

## 2020-08-26 DIAGNOSIS — F17210 Nicotine dependence, cigarettes, uncomplicated: Secondary | ICD-10-CM | POA: Diagnosis present

## 2020-08-26 DIAGNOSIS — I7092 Chronic total occlusion of artery of the extremities: Secondary | ICD-10-CM | POA: Diagnosis present

## 2020-08-26 DIAGNOSIS — I10 Essential (primary) hypertension: Secondary | ICD-10-CM | POA: Diagnosis present

## 2020-08-26 DIAGNOSIS — I97648 Postprocedural seroma of a circulatory system organ or structure following other circulatory system procedure: Secondary | ICD-10-CM | POA: Diagnosis not present

## 2020-08-26 DIAGNOSIS — Y832 Surgical operation with anastomosis, bypass or graft as the cause of abnormal reaction of the patient, or of later complication, without mention of misadventure at the time of the procedure: Secondary | ICD-10-CM | POA: Diagnosis not present

## 2020-08-26 DIAGNOSIS — I743 Embolism and thrombosis of arteries of the lower extremities: Secondary | ICD-10-CM | POA: Diagnosis not present

## 2020-08-26 DIAGNOSIS — I70221 Atherosclerosis of native arteries of extremities with rest pain, right leg: Secondary | ICD-10-CM | POA: Diagnosis present

## 2020-08-26 DIAGNOSIS — M1611 Unilateral primary osteoarthritis, right hip: Secondary | ICD-10-CM | POA: Diagnosis present

## 2020-08-26 DIAGNOSIS — I959 Hypotension, unspecified: Secondary | ICD-10-CM | POA: Diagnosis not present

## 2020-08-26 DIAGNOSIS — I739 Peripheral vascular disease, unspecified: Secondary | ICD-10-CM | POA: Diagnosis not present

## 2020-08-26 DIAGNOSIS — Z881 Allergy status to other antibiotic agents status: Secondary | ICD-10-CM | POA: Diagnosis not present

## 2020-08-26 DIAGNOSIS — S301XXA Contusion of abdominal wall, initial encounter: Secondary | ICD-10-CM | POA: Diagnosis not present

## 2020-08-26 DIAGNOSIS — I714 Abdominal aortic aneurysm, without rupture: Secondary | ICD-10-CM | POA: Diagnosis present

## 2020-08-26 DIAGNOSIS — I517 Cardiomegaly: Secondary | ICD-10-CM | POA: Diagnosis not present

## 2020-08-26 DIAGNOSIS — D62 Acute posthemorrhagic anemia: Secondary | ICD-10-CM | POA: Diagnosis not present

## 2020-08-26 DIAGNOSIS — Z888 Allergy status to other drugs, medicaments and biological substances status: Secondary | ICD-10-CM | POA: Diagnosis not present

## 2020-08-26 DIAGNOSIS — I480 Paroxysmal atrial fibrillation: Secondary | ICD-10-CM | POA: Diagnosis present

## 2020-08-26 DIAGNOSIS — I723 Aneurysm of iliac artery: Secondary | ICD-10-CM | POA: Diagnosis present

## 2020-08-26 LAB — SARS CORONAVIRUS 2 (TAT 6-24 HRS): SARS Coronavirus 2: NEGATIVE

## 2020-08-28 NOTE — Anesthesia Preprocedure Evaluation (Addendum)
Anesthesia Evaluation  Patient identified by MRN, date of birth, ID band Patient awake    Reviewed: Allergy & Precautions, NPO status , Patient's Chart, lab work & pertinent test results  Airway Mallampati: II  TM Distance: >3 FB Neck ROM: Full    Dental  (+) Dental Advisory Given, Lower Dentures, Upper Dentures   Pulmonary Current Smoker and Patient abstained from smoking.,    Pulmonary exam normal breath sounds clear to auscultation       Cardiovascular hypertension, Pt. on medications (-) angina+ Peripheral Vascular Disease  Normal cardiovascular exam+ dysrhythmias Atrial Fibrillation + Valvular Problems/Murmurs AS  Rhythm:Regular Rate:Normal     Neuro/Psych negative neurological ROS     GI/Hepatic negative GI ROS, Neg liver ROS,   Endo/Other  negative endocrine ROS  Renal/GU negative Renal ROS     Musculoskeletal  (+) Arthritis ,   Abdominal   Peds  Hematology negative hematology ROS (+)   Anesthesia Other Findings   Reproductive/Obstetrics                            Anesthesia Physical Anesthesia Plan  ASA: III  Anesthesia Plan: General   Post-op Pain Management:    Induction: Intravenous  PONV Risk Score and Plan: 2 and Dexamethasone and Ondansetron  Airway Management Planned: Oral ETT  Additional Equipment: Arterial line  Intra-op Plan:   Post-operative Plan: Extubation in OR  Informed Consent: I have reviewed the patients History and Physical, chart, labs and discussed the procedure including the risks, benefits and alternatives for the proposed anesthesia with the patient or authorized representative who has indicated his/her understanding and acceptance.     Dental advisory given  Plan Discussed with: CRNA  Anesthesia Plan Comments: (2 large bore PIV)       Anesthesia Quick Evaluation

## 2020-08-29 ENCOUNTER — Inpatient Hospital Stay (HOSPITAL_COMMUNITY): Payer: Medicare HMO | Admitting: Physician Assistant

## 2020-08-29 ENCOUNTER — Inpatient Hospital Stay (HOSPITAL_COMMUNITY): Payer: Medicare HMO

## 2020-08-29 ENCOUNTER — Encounter (HOSPITAL_COMMUNITY): Payer: Self-pay | Admitting: Vascular Surgery

## 2020-08-29 ENCOUNTER — Encounter (HOSPITAL_COMMUNITY)
Admission: RE | Disposition: A | Discharge status: Home / Self Care | Payer: Self-pay | Source: Home / Self Care | Attending: Vascular Surgery

## 2020-08-29 ENCOUNTER — Inpatient Hospital Stay (HOSPITAL_COMMUNITY): Payer: Medicare HMO | Admitting: Anesthesiology

## 2020-08-29 ENCOUNTER — Other Ambulatory Visit: Payer: Self-pay

## 2020-08-29 ENCOUNTER — Inpatient Hospital Stay (HOSPITAL_COMMUNITY)
Admission: RE | Admit: 2020-08-29 | Discharge: 2020-09-02 | DRG: 269 | Disposition: A | Discharge status: Home / Self Care | Payer: Medicare HMO | Attending: Vascular Surgery | Admitting: Vascular Surgery

## 2020-08-29 DIAGNOSIS — Z7901 Long term (current) use of anticoagulants: Secondary | ICD-10-CM

## 2020-08-29 DIAGNOSIS — Z79899 Other long term (current) drug therapy: Secondary | ICD-10-CM | POA: Diagnosis not present

## 2020-08-29 DIAGNOSIS — I723 Aneurysm of iliac artery: Secondary | ICD-10-CM | POA: Diagnosis present

## 2020-08-29 DIAGNOSIS — I7092 Chronic total occlusion of artery of the extremities: Secondary | ICD-10-CM | POA: Diagnosis present

## 2020-08-29 DIAGNOSIS — Z881 Allergy status to other antibiotic agents status: Secondary | ICD-10-CM | POA: Diagnosis not present

## 2020-08-29 DIAGNOSIS — I350 Nonrheumatic aortic (valve) stenosis: Secondary | ICD-10-CM | POA: Diagnosis present

## 2020-08-29 DIAGNOSIS — I722 Aneurysm of renal artery: Secondary | ICD-10-CM | POA: Diagnosis present

## 2020-08-29 DIAGNOSIS — M1611 Unilateral primary osteoarthritis, right hip: Secondary | ICD-10-CM | POA: Diagnosis present

## 2020-08-29 DIAGNOSIS — I97648 Postprocedural seroma of a circulatory system organ or structure following other circulatory system procedure: Secondary | ICD-10-CM | POA: Diagnosis not present

## 2020-08-29 DIAGNOSIS — D62 Acute posthemorrhagic anemia: Secondary | ICD-10-CM | POA: Diagnosis not present

## 2020-08-29 DIAGNOSIS — Z888 Allergy status to other drugs, medicaments and biological substances status: Secondary | ICD-10-CM | POA: Diagnosis not present

## 2020-08-29 DIAGNOSIS — I739 Peripheral vascular disease, unspecified: Secondary | ICD-10-CM

## 2020-08-29 DIAGNOSIS — S301XXA Contusion of abdominal wall, initial encounter: Secondary | ICD-10-CM | POA: Diagnosis not present

## 2020-08-29 DIAGNOSIS — I959 Hypotension, unspecified: Secondary | ICD-10-CM | POA: Diagnosis not present

## 2020-08-29 DIAGNOSIS — I743 Embolism and thrombosis of arteries of the lower extremities: Secondary | ICD-10-CM | POA: Diagnosis not present

## 2020-08-29 DIAGNOSIS — I10 Essential (primary) hypertension: Secondary | ICD-10-CM | POA: Diagnosis present

## 2020-08-29 DIAGNOSIS — Y832 Surgical operation with anastomosis, bypass or graft as the cause of abnormal reaction of the patient, or of later complication, without mention of misadventure at the time of the procedure: Secondary | ICD-10-CM | POA: Diagnosis not present

## 2020-08-29 DIAGNOSIS — F17210 Nicotine dependence, cigarettes, uncomplicated: Secondary | ICD-10-CM | POA: Diagnosis not present

## 2020-08-29 DIAGNOSIS — I714 Abdominal aortic aneurysm, without rupture, unspecified: Secondary | ICD-10-CM | POA: Diagnosis present

## 2020-08-29 DIAGNOSIS — Z20822 Contact with and (suspected) exposure to covid-19: Secondary | ICD-10-CM | POA: Diagnosis not present

## 2020-08-29 DIAGNOSIS — I70221 Atherosclerosis of native arteries of extremities with rest pain, right leg: Secondary | ICD-10-CM | POA: Diagnosis present

## 2020-08-29 DIAGNOSIS — I480 Paroxysmal atrial fibrillation: Secondary | ICD-10-CM | POA: Diagnosis present

## 2020-08-29 HISTORY — PX: ENDARTERECTOMY FEMORAL: SHX5804

## 2020-08-29 HISTORY — PX: ABDOMINAL AORTIC ENDOVASCULAR STENT GRAFT: SHX5707

## 2020-08-29 HISTORY — PX: ULTRASOUND GUIDANCE FOR VASCULAR ACCESS: SHX6516

## 2020-08-29 HISTORY — PX: EMBOLECTOMY: SHX44

## 2020-08-29 HISTORY — PX: PATCH ANGIOPLASTY: SHX6230

## 2020-08-29 LAB — CBC
HCT: 40.7 % (ref 39.0–52.0)
HCT: 36.5 % — ABNORMAL LOW (ref 39.0–52.0)
HCT: 30.2 % — ABNORMAL LOW (ref 39.0–52.0)
Hemoglobin: 12.9 g/dL — ABNORMAL LOW (ref 13.0–17.0)
Hemoglobin: 11.7 g/dL — ABNORMAL LOW (ref 13.0–17.0)
Hemoglobin: 10 g/dL — ABNORMAL LOW (ref 13.0–17.0)
MCH: 31.3 pg (ref 26.0–34.0)
MCH: 31.4 pg (ref 26.0–34.0)
MCH: 31.9 pg (ref 26.0–34.0)
MCHC: 31.7 g/dL (ref 30.0–36.0)
MCHC: 32.1 g/dL (ref 30.0–36.0)
MCHC: 33.1 g/dL (ref 30.0–36.0)
MCV: 98.8 fL (ref 80.0–100.0)
MCV: 97.9 fL (ref 80.0–100.0)
MCV: 96.5 fL (ref 80.0–100.0)
Platelets: 171 10*3/uL (ref 150–400)
Platelets: 187 10*3/uL (ref 150–400)
Platelets: 154 10*3/uL (ref 150–400)
RBC: 4.12 MIL/uL — ABNORMAL LOW (ref 4.22–5.81)
RBC: 3.73 MIL/uL — ABNORMAL LOW (ref 4.22–5.81)
RBC: 3.13 MIL/uL — ABNORMAL LOW (ref 4.22–5.81)
RDW: 13.1 % (ref 11.5–15.5)
RDW: 13.1 % (ref 11.5–15.5)
RDW: 13.2 % (ref 11.5–15.5)
WBC: 7.7 10*3/uL (ref 4.0–10.5)
WBC: 10 10*3/uL (ref 4.0–10.5)
WBC: 10.3 10*3/uL (ref 4.0–10.5)
nRBC: 0 % (ref 0.0–0.2)
nRBC: 0 % (ref 0.0–0.2)
nRBC: 0 % (ref 0.0–0.2)

## 2020-08-29 LAB — APTT
aPTT: >200 seconds (ref 24–36)
aPTT: >200 seconds (ref 24–36)

## 2020-08-29 LAB — BASIC METABOLIC PANEL
Anion gap: 8 (ref 5–15)
BUN: 15 mg/dL (ref 8–23)
CO2: 21 mmol/L — ABNORMAL LOW (ref 22–32)
Calcium: 7.7 mg/dL — ABNORMAL LOW (ref 8.9–10.3)
Chloride: 108 mmol/L (ref 98–111)
Creatinine, Ser: 0.89 mg/dL (ref 0.61–1.24)
GFR, Estimated: >60 mL/min (ref >60)
Glucose, Bld: 177 mg/dL — ABNORMAL HIGH (ref 70–99)
Potassium: 4.2 mmol/L (ref 3.5–5.1)
Sodium: 137 mmol/L (ref 135–145)

## 2020-08-29 LAB — PROTIME-INR
INR: 1.3 — ABNORMAL HIGH (ref 0.8–1.2)
INR: 1.2 (ref 0.8–1.2)
Prothrombin Time: 15.5 seconds — ABNORMAL HIGH (ref 11.4–15.2)
Prothrombin Time: 14.9 seconds (ref 11.4–15.2)

## 2020-08-29 LAB — POCT ACTIVATED CLOTTING TIME: Activated Clotting Time: 392 seconds

## 2020-08-29 LAB — MAGNESIUM: Magnesium: 1.9 mg/dL (ref 1.7–2.4)

## 2020-08-29 SURGERY — INSERTION, ENDOVASCULAR STENT GRAFT, AORTA, ABDOMINAL
Anesthesia: General | Site: Leg Lower | Laterality: Right

## 2020-08-29 MED ORDER — CHLORHEXIDINE GLUCONATE CLOTH 2 % EX PADS
6.0000 | MEDICATED_PAD | Freq: Every day | CUTANEOUS | Status: DC
  Administered 2020-08-30: 6 via TOPICAL

## 2020-08-29 MED ORDER — CHLORHEXIDINE GLUCONATE CLOTH 2 % EX PADS: 6.0000 | MEDICATED_PAD | Freq: Once | CUTANEOUS | Status: DC

## 2020-08-29 MED ORDER — METOPROLOL TARTRATE 25 MG PO TABS: 25.0000 mg | ORAL_TABLET | Freq: Two times a day (BID) | ORAL | Status: DC | PRN

## 2020-08-29 MED ORDER — SODIUM CHLORIDE 0.9 % IV SOLN: 500.0000 mL | Freq: Once | INTRAVENOUS | Status: DC

## 2020-08-29 MED ORDER — ONDANSETRON HCL 4 MG/2ML IJ SOLN
INTRAMUSCULAR | Status: DC | PRN
  Administered 2020-08-29: 4 mg via INTRAVENOUS

## 2020-08-29 MED ORDER — 0.9 % SODIUM CHLORIDE (POUR BTL) OPTIME
TOPICAL | Status: DC | PRN
  Administered 2020-08-29: 1000 mL

## 2020-08-29 MED ORDER — PHENYLEPHRINE 40 MCG/ML (10ML) SYRINGE FOR IV PUSH (FOR BLOOD PRESSURE SUPPORT)
PREFILLED_SYRINGE | INTRAVENOUS | Status: DC | PRN
  Administered 2020-08-29: 80 ug via INTRAVENOUS
  Administered 2020-08-29: 40 ug via INTRAVENOUS

## 2020-08-29 MED ORDER — EPHEDRINE SULFATE-NACL 50-0.9 MG/10ML-% IV SOSY
PREFILLED_SYRINGE | INTRAVENOUS | Status: DC | PRN
  Administered 2020-08-29 (×4): 10 mg via INTRAVENOUS

## 2020-08-29 MED ORDER — ASPIRIN EC 81 MG PO TBEC
81.0000 mg | DELAYED_RELEASE_TABLET | Freq: Every day | ORAL | Status: DC
  Administered 2020-08-30 – 2020-09-02 (×4): 81 mg via ORAL
  Filled 2020-08-29 (×4): qty 1

## 2020-08-29 MED ORDER — PROPOFOL 10 MG/ML IV BOLUS
INTRAVENOUS | Status: DC | PRN
  Administered 2020-08-29: 200 mg via INTRAVENOUS

## 2020-08-29 MED ORDER — PHENYLEPHRINE HCL-NACL 10-0.9 MG/250ML-% IV SOLN
INTRAVENOUS | Status: DC | PRN
  Administered 2020-08-29: 30 ug/min via INTRAVENOUS

## 2020-08-29 MED ORDER — PHENOL 1.4 % MT LIQD: 1.0000 | OROMUCOSAL | Status: DC | PRN

## 2020-08-29 MED ORDER — ACETAMINOPHEN 325 MG PO TABS
325.0000 mg | ORAL_TABLET | ORAL | Status: DC | PRN
  Administered 2020-08-30 – 2020-09-02 (×5): 650 mg via ORAL
  Filled 2020-08-29 (×6): qty 2

## 2020-08-29 MED ORDER — CIPROFLOXACIN HCL 500 MG PO TABS
500.0000 mg | ORAL_TABLET | Freq: Two times a day (BID) | ORAL | Status: DC
Start: 2020-08-29 — End: 2020-08-29

## 2020-08-29 MED ORDER — ROCURONIUM BROMIDE 10 MG/ML (PF) SYRINGE
PREFILLED_SYRINGE | INTRAVENOUS | Status: AC
  Filled 2020-08-29: qty 10

## 2020-08-29 MED ORDER — ONDANSETRON HCL 4 MG/2ML IJ SOLN
INTRAMUSCULAR | Status: AC
  Filled 2020-08-29: qty 2

## 2020-08-29 MED ORDER — HEPARIN SODIUM (PORCINE) 1000 UNIT/ML IJ SOLN
INTRAMUSCULAR | Status: DC | PRN
  Administered 2020-08-29 (×4): 10000 [IU] via INTRAVENOUS

## 2020-08-29 MED ORDER — FENTANYL CITRATE (PF) 100 MCG/2ML IJ SOLN
25.0000 ug | INTRAMUSCULAR | Status: DC | PRN
  Administered 2020-08-29 (×2): 50 ug via INTRAVENOUS

## 2020-08-29 MED ORDER — CEFAZOLIN SODIUM-DEXTROSE 2-4 GM/100ML-% IV SOLN
2.0000 g | INTRAVENOUS | Status: AC
  Administered 2020-08-29 (×2): 2 g via INTRAVENOUS

## 2020-08-29 MED ORDER — SODIUM CHLORIDE 0.9 % IV SOLN: INTRAVENOUS | Status: DC

## 2020-08-29 MED ORDER — OXYCODONE-ACETAMINOPHEN 5-325 MG PO TABS
1.0000 | ORAL_TABLET | ORAL | Status: DC | PRN
Start: 2020-08-29 — End: 2020-09-02

## 2020-08-29 MED ORDER — LACTATED RINGERS IV SOLN: INTRAVENOUS | Status: DC

## 2020-08-29 MED ORDER — SODIUM CHLORIDE 0.9 % IV SOLN
500.0000 mL | Freq: Once | INTRAVENOUS | Status: AC | PRN
  Administered 2020-08-29 (×2): 500 mL via INTRAVENOUS

## 2020-08-29 MED ORDER — VITAMIN D 25 MCG (1000 UNIT) PO TABS
1000.0000 [IU] | ORAL_TABLET | Freq: Two times a day (BID) | ORAL | Status: DC
  Administered 2020-08-30 – 2020-09-02 (×7): 1000 [IU] via ORAL
  Filled 2020-08-29 (×9): qty 1

## 2020-08-29 MED ORDER — HYDRALAZINE HCL 20 MG/ML IJ SOLN: 5.0000 mg | INTRAMUSCULAR | Status: DC | PRN

## 2020-08-29 MED ORDER — ACETAMINOPHEN 650 MG RE SUPP: 325.0000 mg | RECTAL | Status: DC | PRN

## 2020-08-29 MED ORDER — ALUM & MAG HYDROXIDE-SIMETH 200-200-20 MG/5ML PO SUSP: 15.0000 mL | ORAL | Status: DC | PRN

## 2020-08-29 MED ORDER — PANTOPRAZOLE SODIUM 40 MG PO TBEC
40.0000 mg | DELAYED_RELEASE_TABLET | Freq: Every day | ORAL | Status: DC
  Administered 2020-08-29 – 2020-09-02 (×5): 40 mg via ORAL
  Filled 2020-08-29 (×6): qty 1

## 2020-08-29 MED ORDER — ONDANSETRON HCL 4 MG/2ML IJ SOLN: 4.0000 mg | Freq: Once | INTRAMUSCULAR | Status: DC | PRN

## 2020-08-29 MED ORDER — PROPOFOL 10 MG/ML IV BOLUS
INTRAVENOUS | Status: AC
  Filled 2020-08-29: qty 40

## 2020-08-29 MED ORDER — ORAL CARE MOUTH RINSE: 15.0000 mL | Freq: Once | OROMUCOSAL | Status: AC

## 2020-08-29 MED ORDER — METOPROLOL TARTRATE 5 MG/5ML IV SOLN: 2.0000 mg | INTRAVENOUS | Status: DC | PRN

## 2020-08-29 MED ORDER — IODIXANOL 320 MG/ML IV SOLN
INTRAVENOUS | Status: DC | PRN
  Administered 2020-08-29: 130 mL via INTRA_ARTERIAL

## 2020-08-29 MED ORDER — CHLORHEXIDINE GLUCONATE 0.12 % MT SOLN
OROMUCOSAL | Status: AC
  Administered 2020-08-29: 15 mL via OROMUCOSAL
  Filled 2020-08-29: qty 15

## 2020-08-29 MED ORDER — DOCUSATE SODIUM 100 MG PO CAPS
100.0000 mg | ORAL_CAPSULE | Freq: Every day | ORAL | Status: DC
  Administered 2020-08-30 – 2020-09-01 (×3): 100 mg via ORAL
  Filled 2020-08-29 (×5): qty 1

## 2020-08-29 MED ORDER — PROTAMINE SULFATE 10 MG/ML IV SOLN: 50.0000 mg | Freq: Once | INTRAVENOUS | Status: DC

## 2020-08-29 MED ORDER — PROTAMINE SULFATE 10 MG/ML IV SOLN
50.0000 mg | INTRAVENOUS | Status: AC
  Administered 2020-08-29: 50 mg via INTRAVENOUS
  Filled 2020-08-29: qty 5

## 2020-08-29 MED ORDER — CEFAZOLIN SODIUM-DEXTROSE 2-4 GM/100ML-% IV SOLN
2.0000 g | Freq: Three times a day (TID) | INTRAVENOUS | Status: AC
  Administered 2020-08-29 – 2020-08-30 (×2): 2 g via INTRAVENOUS
  Filled 2020-08-29 (×2): qty 100

## 2020-08-29 MED ORDER — CEFAZOLIN SODIUM-DEXTROSE 2-4 GM/100ML-% IV SOLN
INTRAVENOUS | Status: AC
  Filled 2020-08-29: qty 100

## 2020-08-29 MED ORDER — FENTANYL CITRATE (PF) 250 MCG/5ML IJ SOLN
INTRAMUSCULAR | Status: AC
  Filled 2020-08-29: qty 5

## 2020-08-29 MED ORDER — HEMOSTATIC AGENTS (NO CHARGE) OPTIME
TOPICAL | Status: DC | PRN
  Administered 2020-08-29: 1 via TOPICAL

## 2020-08-29 MED ORDER — GUAIFENESIN-DM 100-10 MG/5ML PO SYRP: 15.0000 mL | ORAL_SOLUTION | ORAL | Status: DC | PRN

## 2020-08-29 MED ORDER — SODIUM CHLORIDE 0.9 % IV SOLN
INTRAVENOUS | Status: DC | PRN
  Administered 2020-08-29 (×2): 500 mL

## 2020-08-29 MED ORDER — SODIUM CHLORIDE 0.9 % IV SOLN
INTRAVENOUS | Status: AC
  Filled 2020-08-29: qty 1.2

## 2020-08-29 MED ORDER — ALBUMIN HUMAN 5 % IV SOLN: INTRAVENOUS | Status: DC | PRN

## 2020-08-29 MED ORDER — SUGAMMADEX SODIUM 200 MG/2ML IV SOLN
INTRAVENOUS | Status: DC | PRN
  Administered 2020-08-29: 200 mg via INTRAVENOUS

## 2020-08-29 MED ORDER — FENTANYL CITRATE (PF) 100 MCG/2ML IJ SOLN
INTRAMUSCULAR | Status: AC
  Filled 2020-08-29: qty 2

## 2020-08-29 MED ORDER — DILTIAZEM HCL ER COATED BEADS 120 MG PO CP24
120.0000 mg | ORAL_CAPSULE | Freq: Every day | ORAL | Status: DC
  Administered 2020-08-30 – 2020-09-02 (×4): 120 mg via ORAL
  Filled 2020-08-29 (×6): qty 1

## 2020-08-29 MED ORDER — CHLORHEXIDINE GLUCONATE 0.12 % MT SOLN: 15.0000 mL | Freq: Once | OROMUCOSAL | Status: AC

## 2020-08-29 MED ORDER — SODIUM CHLORIDE 0.9 % IV BOLUS
1000.0000 mL | Freq: Once | INTRAVENOUS | Status: AC
  Administered 2020-08-29: 1000 mL via INTRAVENOUS

## 2020-08-29 MED ORDER — MIDAZOLAM HCL 2 MG/2ML IJ SOLN
INTRAMUSCULAR | Status: DC | PRN
  Administered 2020-08-29: 2 mg via INTRAVENOUS

## 2020-08-29 MED ORDER — ACETAMINOPHEN 500 MG PO TABS
ORAL_TABLET | ORAL | Status: AC
  Administered 2020-08-29: 1000 mg via ORAL
  Filled 2020-08-29: qty 2

## 2020-08-29 MED ORDER — ROSUVASTATIN CALCIUM 20 MG PO TABS
20.0000 mg | ORAL_TABLET | Freq: Every evening | ORAL | Status: DC
Start: 2020-08-29 — End: 2020-08-29

## 2020-08-29 MED ORDER — SODIUM CHLORIDE 0.9 % IV SOLN: Freq: Once | INTRAVENOUS | Status: AC

## 2020-08-29 MED ORDER — LABETALOL HCL 5 MG/ML IV SOLN: 10.0000 mg | INTRAVENOUS | Status: DC | PRN

## 2020-08-29 MED ORDER — ROSUVASTATIN CALCIUM 5 MG PO TABS
10.0000 mg | ORAL_TABLET | Freq: Every evening | ORAL | Status: DC
  Administered 2020-08-30 – 2020-09-01 (×3): 10 mg via ORAL
  Filled 2020-08-29 (×5): qty 2

## 2020-08-29 MED ORDER — LIDOCAINE 2% (20 MG/ML) 5 ML SYRINGE
INTRAMUSCULAR | Status: DC | PRN
  Administered 2020-08-29: 60 mg via INTRAVENOUS

## 2020-08-29 MED ORDER — ACETAMINOPHEN 500 MG PO TABS: 1000.0000 mg | ORAL_TABLET | Freq: Once | ORAL | Status: AC

## 2020-08-29 MED ORDER — DEXAMETHASONE SODIUM PHOSPHATE 10 MG/ML IJ SOLN
INTRAMUSCULAR | Status: DC | PRN
  Administered 2020-08-29: 10 mg via INTRAVENOUS

## 2020-08-29 MED ORDER — FENTANYL CITRATE (PF) 250 MCG/5ML IJ SOLN
INTRAMUSCULAR | Status: DC | PRN
  Administered 2020-08-29 (×5): 50 ug via INTRAVENOUS
  Administered 2020-08-29: 100 ug via INTRAVENOUS

## 2020-08-29 MED ORDER — MIDAZOLAM HCL 2 MG/2ML IJ SOLN
INTRAMUSCULAR | Status: AC
  Filled 2020-08-29: qty 2

## 2020-08-29 MED ORDER — MAGNESIUM SULFATE 2 GM/50ML IV SOLN: 2.0000 g | Freq: Every day | INTRAVENOUS | Status: DC | PRN

## 2020-08-29 MED ORDER — MORPHINE SULFATE (PF) 2 MG/ML IV SOLN
2.0000 mg | INTRAVENOUS | Status: DC | PRN
  Administered 2020-08-29 – 2020-08-30 (×2): 2 mg via INTRAVENOUS
  Filled 2020-08-29 (×2): qty 1

## 2020-08-29 MED ORDER — LORATADINE 10 MG PO TABS
10.0000 mg | ORAL_TABLET | Freq: Every day | ORAL | Status: DC
  Administered 2020-08-30 – 2020-09-02 (×4): 10 mg via ORAL
  Filled 2020-08-29 (×5): qty 1

## 2020-08-29 MED ORDER — POTASSIUM CHLORIDE CRYS ER 20 MEQ PO TBCR
20.0000 meq | EXTENDED_RELEASE_TABLET | Freq: Every day | ORAL | Status: AC | PRN
  Administered 2020-09-01: 40 meq via ORAL
  Filled 2020-08-29: qty 2

## 2020-08-29 MED ORDER — ROCURONIUM BROMIDE 10 MG/ML (PF) SYRINGE
PREFILLED_SYRINGE | INTRAVENOUS | Status: DC | PRN
  Administered 2020-08-29: 100 mg via INTRAVENOUS
  Administered 2020-08-29: 30 mg via INTRAVENOUS

## 2020-08-29 MED ORDER — DEXAMETHASONE SODIUM PHOSPHATE 10 MG/ML IJ SOLN
INTRAMUSCULAR | Status: AC
  Filled 2020-08-29: qty 1

## 2020-08-29 MED ORDER — ONDANSETRON HCL 4 MG/2ML IJ SOLN: 4.0000 mg | Freq: Four times a day (QID) | INTRAMUSCULAR | Status: DC | PRN

## 2020-08-29 SURGICAL SUPPLY — 82 items
CANISTER SUCT 3000ML PPV (MISCELLANEOUS) ×6 IMPLANT
CANNULA VESSEL 3MM 2 BLNT TIP (CANNULA) ×6 IMPLANT
CATH BEACON 5 .035 65 C2 TIP (CATHETERS) ×6 IMPLANT
CATH BEACON 5.038 65CM KMP-01 (CATHETERS) ×6 IMPLANT
CATH EMB 3FR 80CM (CATHETERS) ×6 IMPLANT
CATH EMB 4FR 80CM (CATHETERS) ×6 IMPLANT
CATH OMNI FLUSH .035X70CM (CATHETERS) ×6 IMPLANT
CLIP VESOCCLUDE MED 24/CT (CLIP) ×6 IMPLANT
DERMABOND ADVANCED (GAUZE/BANDAGES/DRESSINGS) ×6
DERMABOND ADVANCED .7 DNX12 (GAUZE/BANDAGES/DRESSINGS) ×12 IMPLANT
DEVICE CLOSURE PERCLS PRGLD 6F (VASCULAR PRODUCTS) ×28 IMPLANT
DEVICE TORQUE KENDALL .025-038 (MISCELLANEOUS) ×6 IMPLANT
DRAIN HEMOVAC 1/8 X 5 (WOUND CARE) ×6 IMPLANT
DRSG TEGADERM 2-3/8X2-3/4 SM (GAUZE/BANDAGES/DRESSINGS) ×12 IMPLANT
DRYSEAL FLEXSHEATH 14FR 33CM (SHEATH) ×2
DRYSEAL FLEXSHEATH 18FR 33CM (SHEATH) ×2
ELECT BLADE 4.0 EZ CLEAN MEGAD (MISCELLANEOUS) ×6
ELECT KIT SAFEOP EMG/NMJ (KITS) ×6
ELECT REM PT RETURN 9FT ADLT (ELECTROSURGICAL) ×6
ELECTRODE BLDE 4.0 EZ CLN MEGD (MISCELLANEOUS) ×4 IMPLANT
ELECTRODE REM PT RTRN 9FT ADLT (ELECTROSURGICAL) ×4 IMPLANT
EVACUATOR SILICONE 100CC (DRAIN) ×6 IMPLANT
EXCLUDER TNK LEG 35MX14X18 (Endovascular Graft) ×4 IMPLANT
EXCLUDER TRUNK LEG 35MX14X18 (Endovascular Graft) ×6 IMPLANT
GAUZE 4X4 16PLY RFD (DISPOSABLE) ×6 IMPLANT
GAUZE SPONGE 2X2 8PLY STRL LF (GAUZE/BANDAGES/DRESSINGS) ×4 IMPLANT
GLOVE BIO SURGEON STRL SZ7.5 (GLOVE) ×6 IMPLANT
GLOVE SURG SS PI 6.5 STRL IVOR (GLOVE) ×12 IMPLANT
GOWN STRL REUS W/ TWL LRG LVL3 (GOWN DISPOSABLE) ×16 IMPLANT
GOWN STRL REUS W/TWL LRG LVL3 (GOWN DISPOSABLE) ×24
GRAFT BALLN CATH 65CM (STENTS) ×4 IMPLANT
GUIDEWIRE ANGLED .035X150CM (WIRE) ×6 IMPLANT
HEMOSTAT SPONGE AVITENE ULTRA (HEMOSTASIS) ×6 IMPLANT
KIT BASIN OR (CUSTOM PROCEDURE TRAY) ×6 IMPLANT
KIT EMG SRFC ELECT (KITS) ×4 IMPLANT
KIT TURNOVER KIT B (KITS) ×6 IMPLANT
LEG CONTRALATERAL 16X12X12 (Vascular Products) ×6 IMPLANT
LEG CONTRALATERAL 23X12 (Endovascular Graft) ×6 IMPLANT
LOOP VESSEL MAXI BLUE (MISCELLANEOUS) ×6 IMPLANT
LOOP VESSEL MINI RED (MISCELLANEOUS) ×6 IMPLANT
NEEDLE PERC 18GX7CM (NEEDLE) ×6 IMPLANT
NS IRRIG 1000ML POUR BTL (IV SOLUTION) ×6 IMPLANT
PACK ENDOVASCULAR (PACKS) ×6 IMPLANT
PATCH VASC XENOSURE 1CMX6CM (Vascular Products) ×6 IMPLANT
PATCH VASC XENOSURE 1X6 (Vascular Products) ×4 IMPLANT
PERCLOSE PROGLIDE 6F (VASCULAR PRODUCTS) ×42
SET MICROPUNCTURE 5F STIFF (MISCELLANEOUS) ×6 IMPLANT
SHEATH BRITE TIP 8FR 23CM (SHEATH) ×6 IMPLANT
SHEATH DRYSEAL FLEX 14FR 33CM (SHEATH) ×4 IMPLANT
SHEATH DRYSEAL FLEX 18FR 33CM (SHEATH) ×4 IMPLANT
SHEATH PINNACLE 8F 10CM (SHEATH) ×6 IMPLANT
SPONGE GAUZE 2X2 STER 10/PKG (GAUZE/BANDAGES/DRESSINGS) ×2
SPONGE LAP 18X18 RF (DISPOSABLE) ×24 IMPLANT
STAPLER VISISTAT 35W (STAPLE) ×6 IMPLANT
STENT GRAFT BALLN CATH 65CM (STENTS) ×6
STOPCOCK 4 WAY LG BORE MALE ST (IV SETS) ×6 IMPLANT
STOPCOCK MORSE 400PSI 3WAY (MISCELLANEOUS) ×6 IMPLANT
SUT ETHILON 3 0 PS 1 (SUTURE) ×6 IMPLANT
SUT PROLENE 5 0 C 1 24 (SUTURE) ×24 IMPLANT
SUT PROLENE 6 0 BV (SUTURE) ×12 IMPLANT
SUT PROLENE 6 0 CC (SUTURE) ×18 IMPLANT
SUT PROLENE 7 0 BV1 MDA (SUTURE) ×6 IMPLANT
SUT SILK 2 0 (SUTURE) ×6
SUT SILK 2-0 18XBRD TIE 12 (SUTURE) ×4 IMPLANT
SUT SILK 3 0 (SUTURE) ×6
SUT SILK 3-0 18XBRD TIE 12 (SUTURE) ×4 IMPLANT
SUT SILK 4 0 (SUTURE) ×6
SUT SILK 4-0 18XBRD TIE 12 (SUTURE) ×4 IMPLANT
SUT VIC AB 2-0 SH 27 (SUTURE) ×12
SUT VIC AB 2-0 SH 27XBRD (SUTURE) ×8 IMPLANT
SUT VIC AB 3-0 SH 27 (SUTURE) ×18
SUT VIC AB 3-0 SH 27X BRD (SUTURE) ×12 IMPLANT
SUT VIC AB 4-0 PS2 18 (SUTURE) ×6 IMPLANT
SUT VICRYL 4-0 PS2 18IN ABS (SUTURE) ×12 IMPLANT
SYR 20ML LL LF (SYRINGE) ×6 IMPLANT
SYR 3ML LL SCALE MARK (SYRINGE) ×6 IMPLANT
TAPE CLOTH SURG 4X10 WHT LF (GAUZE/BANDAGES/DRESSINGS) ×6 IMPLANT
TOWEL GREEN STERILE (TOWEL DISPOSABLE) ×6 IMPLANT
TRAY FOLEY MTR SLVR 16FR STAT (SET/KITS/TRAYS/PACK) ×6 IMPLANT
TUBING HIGH PRESSURE 120CM (CONNECTOR) ×6 IMPLANT
WIRE AMPLATZ SS-J .035X180CM (WIRE) ×12 IMPLANT
WIRE BENTSON .035X145CM (WIRE) ×12 IMPLANT

## 2020-08-29 NOTE — Progress Notes (Signed)
Administered protamine 50 mg per stat orders and drained 20 cc of bloody drainage from JP drain.

## 2020-08-29 NOTE — Progress Notes (Signed)
Pt arrived to 4e from PACU. Pt oriented to room and staff. Telemetry box applied and CCMD notified. Vitals obtained and stable. Pt has foley and left radial a-line. Right leg has JP in place. 100cc bloody drainage emptied on arrival to room. Per PACU RN, Dr. Oneida Alar is aware of amount of drainage present and is to be expected post procedure. Right groin incision clean and dry with small edema present. Left groin level 0.

## 2020-08-29 NOTE — Progress Notes (Signed)
Pt noted to still having bloody drainage from JP drain. Blood is dark in character. He has good perfusion to the foot PTT is still greater than 200 Hgb 11.7 Was give a liter bolus of saline about one hour ago.  Will give 50 mg of protamine now to see if ongoing ooze will stop D/w pt and wife that he may need re exploration of below knee incision to stop bleeding.  Wife is currently very reluctant to proceed as she seems to have lost confidence.  She is agreeable to giving protamine.    Will keep NPO for now  Will recheck JP in the next hour to make decision after protamine  [name], MD [facility] Office: [phone]

## 2020-08-29 NOTE — Progress Notes (Signed)
Patient's wedding band and small luggage bag given to wife Dianne in the waiting room.

## 2020-08-29 NOTE — Progress Notes (Signed)
Patient awake but groggy in the PACU.  He has put out about 120 cc of dark blood from his Jackson-Pratt drain in the right leg.  The right leg overall is soft.  It is at baseline edema.  He does not have any pain over the anterior compartment.  He has minimal pain in the right leg.  He has a posterior tibial and peroneal Doppler signal in the right leg which is nearly biphasic.  He has a posterior tibial Doppler signal in the left leg.  Left groin is soft no hematoma  Vitals:   08/29/20 1456 08/29/20 1511 08/29/20 1526 08/29/20 1541  BP: 102/62 108/64 108/65 93/64  Pulse: 77 82 78 75  Resp: 11 13 13 10   Temp:      TempSrc:      SpO2: 97% 96% 97% 97%  Weight:      Height:       Overall stable in PACU.  We will continue to monitor JP output for now.  Keep in bed this evening.  Plan will be to start mobilizing him tomorrow if no further bleeding or problems with ischemia in the leg.  [name], MD [facility] [place] Office: [phone]

## 2020-08-29 NOTE — Progress Notes (Signed)
Pt Cory [name] appears clotted Overall right leg seems to be at baseline edema No real pain in leg BP 60V systolic but pt asymptomatic HR 70s conversant Will give and additional 1L saline bolus Updated wife at bedside that drain has clotted and hopefully no further bleeding  Will continue to follow closely Will repeat CBC and PTT at 11 pm Keep NPO for now  [name], MD [facility] [place]: [phone]

## 2020-08-29 NOTE — Progress Notes (Signed)
Pts JP drain has drained 520 ccbloody drainage since arrival to unit. BP dropped to 76P-95K systolic. Dr. Oneida Alar notified. Received verbal order for stat labs and 1 liter NS bolus.

## 2020-08-29 NOTE — Anesthesia Procedure Notes (Signed)
Arterial Line Insertion Start/End1/24/2022 7:00 AM, 08/29/2020 7:05 AM Performed by: Lance Coon, CRNA, CRNA  Patient location: Pre-op. Preanesthetic checklist: patient identified, IV checked, risks and benefits discussed, surgical consent, pre-op evaluation and timeout performed Lidocaine 1% used for infiltration radial was placed Catheter size: 18 G Hand hygiene performed  and maximum sterile barriers used   Attempts: 1 Procedure performed without using ultrasound guided technique. Following insertion, dressing applied and Biopatch. Post procedure assessment: normal  Patient tolerated the procedure well with no immediate complications. Additional procedure comments: Performed by brendan lawrence, SRNA.

## 2020-08-29 NOTE — Interval H&P Note (Signed)
History and Physical Interval Note:  08/29/2020 7:19 AM  Cory [name]  has presented today for surgery, with the diagnosis of AAA.  The various methods of treatment have been discussed with the patient and family. After consideration of risks, benefits and other options for treatment, the patient has consented to  Procedure(s): ABDOMINAL AORTIC ENDOVASCULAR STENT GRAFT (N/A) as a surgical intervention.  The patient's history has been reviewed, patient examined, no change in status, stable for surgery.  I have reviewed the patient's chart and labs.  Questions were answered to the patient's satisfaction.     [name]

## 2020-08-29 NOTE — Progress Notes (Signed)
Patient's family updated midway through operation but was very concerned at the time of operation.  I discussed with the wife post procedure the findings and the need for femoral endarterectomy and tibial embolectomy at the conclusion of the procedure.  She was apparently initially upset that she did not feel like she had been completely informed on exactly what was going on with her husband.  I apologized for this and discussed that we will try to have better communication going forward.  I did meet with her a second time with her daughter Elzie Rings on speaker phone to clarify exactly what was done during the operation and the final outcome.  They seem to be satisfied at this point.  I emphasized to them that certainly any communication going forward we will try to improve and make sure that everyone is on the same page.  They have my office number if they need to page me.  [name], MD [facility] [place] Office: [phone]

## 2020-08-29 NOTE — Op Note (Signed)
Procedure: 1.  Gore Excluder bifurcated stent graft for repair of common iliac aneurysm  2.  Right common femoral endarterectomy with bovine pericardial patch angioplasty right common femoral artery  3.  Right popliteal and tibial embolectomy with patch angioplasty of right below-knee popliteal artery with bovine pericardium  Preoperative diagnosis: Abdominal aortic and common iliac aneurysm  Postoperative diagnosis: Same  Anesthesia: General  Assistant: Risa Grill, PA-C for expediting procedure and assisting with exposure and creation of anastomosis  Operative findings:   1.  3.8 cm abdominal aortic aneurysm  2.  Right common iliac artery 4.4 cm.  3.  35 x 14 x 18 main body Gore Excluder graft delivered via right femoral system, 12 x 12 cm right external iliac extension, 18 French sheath  4.  Left limb 23 x 12 cm, 14 French sheath  Operative details: After informed consent, the patient was taken the operating.  The patient was placed in supine position operating table.  After induction general anesthesia and endotracheal ovation a Foley cath was placed.  Next patient was prepped and draped in usual sterile fashion from the nipple to the knees.  Ultrasound was used to identify the left common femoral artery and femoral bifurcation.  A micropuncture needle was used to cannulate the left common femoral artery and the microwire advanced into the left iliac system.  The micropuncture sheath was placed over this.  The dilator was then removed and the wire replaced with an 035 Bentson wire.  This was advanced up to the level of the abdominal aorta.  A 9 dilator was then used to dilate the tract.  2 separate Pro-glide devices were deployed at a 9:00 and 3 o'clock position.  A 9 French sheath was then placed over the guidewire up to the level of the aortic bifurcation.  Attention was then turned to the right side.  In similar fashion the right common femoral artery was identified as well as the  femoral bifurcation.  There was fairly dense scar tissue on the right groin from previous recent artery exposure.  Micropuncture needle was used to cannulate the right common femoral artery and the microwire advanced into the right iliac system without difficulty.  Micropuncture sheath was then placed over this.  This was then replaced with an 035 Bentson wire.  9 French dilator was used to dilate the tract.  Again there was some resistance due to scar tissue.  ProGlide was deployed in the 9:00 and 3 o'clock position.  A 5 French Omni Flush catheter was advanced over the Bentson wire through the left femoral system and an abdominal aortogram obtained in AP projection.  Should multiple renal arteries on the right side which were patent.  Left renal artery was the lowest.  It was also patent.  The infrarenal abdominal aorta left and right common iliac arteries and external iliac arteries were patent.  There was previous coil embolization of the right internal iliac artery which was successful with no flow seen into the right internal iliac artery system.  At this point an 035 Amplatz wire was advanced through the sheath on the right side all the way up to the descending thoracic aorta the 9 French short sheath was removed and replaced with an 18 French dry seal sheath.  This was advanced up to the level of the renal arteries.  The patient was given 10,000 units of intravenous heparin.  ACT was confirmed to be greater than 250.  A 14 French dry seal sheath was then  placed over an Amplatz wire in the left femoral system up to the level aortic bifurcation.  35 x 14 x 18 cm Gore excluder device was then advanced through the right femoral system up to level the renal arteries.  Contrast angiogram was performed to determine precise level of the renal arteries with a 17 degree cranial viewpoint.  Proximal end of the graft was then deployed.  Initially it was low but low so we reconstrained it and then deployed slightly  higher.  Repeat angiogram showed the renal arteries were above the level of the stent graft and the stent graft started just below the level left renal artery.  Deployed this all the way down to the level of the gate.  Contralateral gate was then cannulated using an 035 KMP catheter and an 035 angled Glidewire.  After we were successfully in the stent graft the 5 French Omni Flush catheter was placed back over the wire and this was tore up within the graft to make sure we were intraluminal.  I then proceeded to deploy a a 23 x 12 left iliac limb after doing a retrograde contrast angiogram to determine precise level of the left internal iliac artery.  We deployed the stent with full overlap of the long marker and then all the way down to the iliac bifurcation.  I then finished out the right leg.  Contrast angiogram was also performed to determine precise level of the origin of the external iliac artery.  We then selected a 12 x 12 cm limb for the right side and this was deployed and a mild balloon then used to tack up the proximal every joint site and every distal end site.  Please angiogram was then performed using the Omni Flush catheter there was no type I or type II endoleak.  Left and right renal arteries were patent.  The distal right external Ackerly was slightly kinked but once I removed the stiff wire and repeated a contrast angiogram this had a nice lay to it and the stent was well opposed to the wall.  At this point we removed the sheaths from the left groin and the Pro-glide devices were sutured down.  There was still bleeding so an additional Proglide was placed in an AP position and we got hemostasis.  Patient had good Doppler flow to the left foot.  The entry site was closed with a Vicryl stitch and Dermabond applied.  Attention was then turned to the right leg.  We removed the sheath over a wire.  There was still fairly significant bleeding despite this care occurring the first 2 ProGlide devices.  2  additional pro glides were fired and we achieved hemostasis.  However at this point there was no flow to the right foot.  Therefore at this point I have made a longitudinal incision in the right groin carried on the subcutaneous tissues down level right common femoral artery.  This did have a pulse within it but did lose the pulse distally.  The circumflex iliac branches had a vessel loop placed around each of them and as well as the distal right external iliac artery.  I then proceeded to dissected out a large branch that came off the medial midportion of the common femoral artery and placed a vessel loop around this as well as the proximal superficial and profunda femoris arteries.  The patient was given another 10,000 units of intravenous heparin.  The artery was then opened longitudinally and there was a large  calcified posterior flap that had raised to obstruct the artery.  A right common femoral endarterectomy was obtained and a good proximal and distal endpoint was obtained although there was a slight step-off distally and this was tacked with four 7-0 Prolene sutures.  Bovine pericardial patch was then brought up in the operative field and sewn as a patch angioplasty using a running 6-0 Prolene suture.  This prior to completion anastomosis it was for blood backbled and thoroughly flushed.  Anastomosis was secured clamps released there is a pulse in the common femoral profunda femoris and superficial femoral arteries immediately.  However there was no Doppler flow to the right foot.  At this point the right groin incision was closed over 2 Ray-Tec 4 x 4's.  The drapes were taken down and a new sterile prep and drape was performed of the entire right lower extremity down to the toes from the umbilicus.  I then proceeded to make a longitudinal incision on the medial aspect of the right leg carried on through the subcutaneous tissues reflecting the saphenous vein posteriorly.  The incision was deepened down at  the below-knee popliteal space.  Below-knee popliteal artery had a pulse within it.  It was dissected free circumferentially.  It was ectatic.  Vesseloops placed proximally.  I then proceeded to dissect out the origin of the anterior tibial and tibioperoneal trunk and Vesseloops were placed around these.  Patient was given an additional 10,000 units of heparin x2 during the course of this portion of the procedure.  The anterior tibial vein was divided between clips and silk ties.  Longitudinal opening was made in the popliteal artery and extended down to the origin of the anterior tibial artery and tibioperoneal trunk.  I tried to get a 3 Fogarty catheter passed down the anterior tibial artery.  This would not be an past the origin.  Previous arteriogram had shown that the anterior tibial artery was occluded in the distal leg so I did not try further attempts.  I then passed 3 Fogarty catheter down the tibioperoneal trunk multiple passes and a long amount of stringy thrombus was removed.  This was thoroughly irrigated with heparinized saline.  The artery was then reoccluded.  There was good backbleeding prior to this.  I then passed a 4 Fogarty multiple times popliteal artery to make sure there was no obstructive flow and no thrombus was obtained.  There was excellent arterial inflow.  A bovine pericardial patch was then sewn on as a patch angioplasty to the below-knee popliteal artery extending down to the origin the tibioperoneal trunk.  This was done with running 6-0 Prolene suture.  This prior to completion anastomosis it was for blood backbled and thoroughly flushed anastomosis was secured clamps released was pulsatile flow by Doppler and the origins of the anterior tibial and tibioperoneal trunk.  Patient had a brisk biphasic posterior tibial Doppler signal at this point.  He also had a monophasic anterior tibial signal at the ankle and a monophasic peroneal Doppler signal at the ankle.  Hemostasis was  obtained in the below-knee incision and a 10 flat Jackson-Pratt drain was brought out through separate stab incision inferiorly and sewn to the skin with a nylon stitch.  The below-knee popliteal incision was then closed in multiple layers of running 203 0 Vicryl suture and 4-0 Vicryl subcuticular stitch in the skin.  Dermabond was applied.  Then she was then turned to the right groin.  The pre-existing staples were removed all the  Raytek were removed.  Hemostasis was obtained and the previous patch angioplasty with 2 repair sutures with 6-0 Prolene.  After hemostasis was obtained the groin was then closed in multiple layers with running 2-0 and 3-0 Vicryl suture and a 4-0 Vicryl subcuticular stitch in the skin.  Dermabond was applied.  Patient had good Doppler flow in both feet at the conclusion of the case.  Patient was extubated in the operating room taken the recovery room in stable condition.  Patient's case was complicated by distal embolization and raising a posterior flap in the right common femoral artery requiring femoral endarterectomy.  These were all dealt with in the OR and no further complications were encountered.

## 2020-08-29 NOTE — Transfer of Care (Signed)
Immediate Anesthesia Transfer of Care Note  Patient: Cory [name]  Procedure(s) Performed: ABDOMINAL AORTIC ENDOVASCULAR STENT GRAFT (N/A Abdomen) ULTRASOUND GUIDANCE FOR VASCULAR ACCESS (Bilateral Groin) EMBOLECTOMY (Right Leg Lower) PATCH ANGIOPLASTY (Right Groin)  Patient Location: PACU  Anesthesia Type:General  Level of Consciousness: drowsy  Airway & Oxygen Therapy: Patient Spontanous Breathing and Patient connected to nasal cannula oxygen  Post-op Assessment: Report given to RN and Post -op Vital signs reviewed and stable  Post vital signs: Reviewed and stable  Last Vitals:  Vitals Value Taken Time  BP 101/59 08/29/20 1411  Temp    Pulse 93 08/29/20 1413  Resp 16 08/29/20 1413  SpO2 89 % 08/29/20 1413  Vitals shown include unvalidated device data.  Last Pain:  Vitals:   08/29/20 0613  TempSrc:   PainSc: 0-No pain      Patients Stated Pain Goal: 2 (02/33/43 5686)  Complications: No complications documented.

## 2020-08-29 NOTE — Anesthesia Procedure Notes (Addendum)
Procedure Name: Intubation Date/Time: 08/29/2020 7:46 AM Performed by: Lance Coon, CRNA Pre-anesthesia Checklist: Patient identified, Emergency Drugs available, Suction available, Patient being monitored and Timeout performed Patient Re-evaluated:Patient Re-evaluated prior to induction Oxygen Delivery Method: Circle system utilized Preoxygenation: Pre-oxygenation with 100% oxygen Induction Type: IV induction Ventilation: Oral airway inserted - appropriate to patient size and Mask ventilation without difficulty Laryngoscope Size: Mac and 4 Grade View: Grade I Tube type: Oral Tube size: 7.5 mm Number of attempts: 1 Airway Equipment and Method: Stylet Placement Confirmation: ETT inserted through vocal cords under direct vision,  positive ETCO2,  CO2 detector and breath sounds checked- equal and bilateral Secured at: 23 cm Tube secured with: Tape Dental Injury: Teeth and Oropharynx as per pre-operative assessment  Comments: Performed by Reyne Dumas, SRNA

## 2020-08-30 ENCOUNTER — Encounter (HOSPITAL_COMMUNITY): Payer: Self-pay | Admitting: Vascular Surgery

## 2020-08-30 LAB — BASIC METABOLIC PANEL
Anion gap: 7 (ref 5–15)
BUN: 12 mg/dL (ref 8–23)
CO2: 22 mmol/L (ref 22–32)
Calcium: 7.5 mg/dL — ABNORMAL LOW (ref 8.9–10.3)
Chloride: 104 mmol/L (ref 98–111)
Creatinine, Ser: 0.8 mg/dL (ref 0.61–1.24)
GFR, Estimated: >60 mL/min (ref >60)
Glucose, Bld: 144 mg/dL — ABNORMAL HIGH (ref 70–99)
Potassium: 3.9 mmol/L (ref 3.5–5.1)
Sodium: 133 mmol/L — ABNORMAL LOW (ref 135–145)

## 2020-08-30 LAB — CBC
HCT: 28.1 % — ABNORMAL LOW (ref 39.0–52.0)
Hemoglobin: 9.4 g/dL — ABNORMAL LOW (ref 13.0–17.0)
MCH: 32.4 pg (ref 26.0–34.0)
MCHC: 33.5 g/dL (ref 30.0–36.0)
MCV: 96.9 fL (ref 80.0–100.0)
Platelets: 143 10*3/uL — ABNORMAL LOW (ref 150–400)
RBC: 2.9 MIL/uL — ABNORMAL LOW (ref 4.22–5.81)
RDW: 13.1 % (ref 11.5–15.5)
WBC: 10.9 10*3/uL — ABNORMAL HIGH (ref 4.0–10.5)
nRBC: 0 % (ref 0.0–0.2)

## 2020-08-30 LAB — POCT ACTIVATED CLOTTING TIME: Activated Clotting Time: 273 seconds

## 2020-08-30 LAB — APTT: aPTT: 31 seconds (ref 24–36)

## 2020-08-30 NOTE — Progress Notes (Signed)
Mobility Specialist - Progress Note   08/30/20 1523  Mobility  Activity Transferred:  Chair to bed  Level of Assistance Standby assist, set-up cues, supervision of patient - no hands on  Assistive Device None  Distance Ambulated (ft) 4 ft  Mobility Response Tolerated well  Mobility performed by Mobility specialist  $Mobility charge 1 Mobility   Pre-mobility:72 HR, 92/61 BP, 96% SpO2 Post-mobility: 75 HR, 106/64 BP, 96% SpO2  Asx during transfer. He did not require any assistance to stand from chair or lay in bed. Pt left laying in bed, wife in room.   Pricilla Handler Mobility Specialist Mobility Specialist Phone: [phone]

## 2020-08-30 NOTE — Progress Notes (Addendum)
Progress Note    08/30/2020 7:11 AM 1 Day Post-Op  Subjective:  RLE sore but is better this morning. Hungry. Wife at bedside.  Notes reviewed. JP drain apparently clogged and he received saline boluses for soft BP. Vitals:   08/30/20 0311 08/30/20 0312  BP: (!) 95/54   Pulse: 72   Resp: 15 15  Temp: 98.7 F (37.1 C)   SpO2: 95%    UOP: 2085 cc Drain output: 1145 cc  Physical Exam: General appearance: Awake, alert in no apparent distress Cardiac: Heart rate and rhythm are regular Respirations: Nonlabored Abdomen: soft, +BS Incisions: Right groin, left groin and right lower leg incisions are all well approximated. Small hematoma of right groin. No significant drain output since approximately 10 pm.  RLE edema (acute on chronic). Calf is fairly soft and he has good ROM of foot and ankle. +Doppler signals of AT, Pt and peroneal. Sensation intact. Extremities: Both feet are warm with intact sensation and motor function.      CBC    Component Value Date/Time   WBC 10.9 (H) 08/30/2020 0520   RBC 2.90 (L) 08/30/2020 0520   HGB 9.4 (L) 08/30/2020 0520   HGB 16.2 05/08/2019 1115   HCT 28.1 (L) 08/30/2020 0520   HCT 49.1 05/08/2019 1115   PLT 143 (L) 08/30/2020 0520   PLT CANCELED 05/08/2019 1115   MCV 96.9 08/30/2020 0520   MCV 98 (H) 05/08/2019 1115   MCH 32.4 08/30/2020 0520   MCHC 33.5 08/30/2020 0520   RDW 13.1 08/30/2020 0520   RDW 12.0 05/08/2019 1115   LYMPHSABS 2.5 04/01/2013 1248   MONOABS 0.7 04/01/2013 1248   EOSABS 0.2 04/01/2013 1248   BASOSABS 0.0 04/01/2013 1248    BMET    Component Value Date/Time   NA 133 (L) 08/30/2020 0520   NA 141 05/16/2018 1425   K 3.9 08/30/2020 0520   CL 104 08/30/2020 0520   CO2 22 08/30/2020 0520   GLUCOSE 144 (H) 08/30/2020 0520   BUN 12 08/30/2020 0520   BUN 13 05/16/2018 1425   CREATININE 0.80 08/30/2020 0520   CALCIUM 7.5 (L) 08/30/2020 0520   GFRNONAA >60 08/30/2020 0520   GFRAA 94 05/16/2018 1425      Intake/Output Summary (Last 24 hours) at 08/30/2020 0711 Last data filed at 08/30/2020 0312 Gross per 24 hour  Intake 3588.14 ml  Output 3530 ml  Net 58.14 ml    HOSPITAL MEDICATIONS Scheduled Meds: . aspirin EC  81 mg Oral Q0600  . Chlorhexidine Gluconate Cloth  6 each Topical Q0600  . cholecalciferol  1,000 Units Oral BID  . diltiazem  120 mg Oral Daily  . docusate sodium  100 mg Oral Daily  . loratadine  10 mg Oral Daily  . pantoprazole  40 mg Oral Daily  . rosuvastatin  10 mg Oral QPM   Continuous Infusions: . sodium chloride 125 mL/hr at 08/30/20 0642  . magnesium sulfate bolus IVPB     PRN Meds:.acetaminophen **OR** acetaminophen, alum & mag hydroxide-simeth, guaiFENesin-dextromethorphan, hydrALAZINE, labetalol, magnesium sulfate bolus IVPB, metoprolol tartrate, metoprolol tartrate, morphine injection, ondansetron, oxyCODONE-acetaminophen, phenol, potassium chloride  Assessment and Plan: 76 yo  POD 1: Gore Excluder bifurcated stent graft for repair of common iliac aneurysm, right common femoral endarterectomy with bovine pericardial patch angioplasty right common femoral artery, right popliteal and tibial embolectomy with patch angioplasty of right below-knee popliteal artery with bovine pericardium. RLE well perfused Afebrile BP soft but stable Acute BLA: no indication for transfusion.  Monitor H and H OOB to chair only today -DVT prophylaxis:  SCDs   Risa Grill, PA-C [facility] [phone] 08/30/2020  7:11 AM   Agree with above Small hematoma right groin and probably pop space on right leg Good doppler signals Wife and pt reassured Will go to chair today but not ambulate Recheck hemoglobin later today  [name], MD [facility] [place] Office: [phone]

## 2020-08-30 NOTE — Anesthesia Postprocedure Evaluation (Signed)
Anesthesia Post Note  Patient: Cory [name]  Procedure(s) Performed: ABDOMINAL AORTIC ENDOVASCULAR STENT GRAFT (N/A Abdomen) ULTRASOUND GUIDANCE FOR VASCULAR ACCESS (Bilateral Groin) POPLITEAL AND TIBIAL EMBOLECTOMY (Right Leg Lower) PATCH ANGIOPLASTY RIGHT BELOW KNEE POPLITEAL ARTERY AND RIGHT COMMON FEMORAL ARTERY (Right Groin) ENDARTERECTOMY FEMORAL (Right Groin)     Patient location during evaluation: PACU Anesthesia Type: General Level of consciousness: awake and alert Pain management: pain level controlled Vital Signs Assessment: post-procedure vital signs reviewed and stable Respiratory status: spontaneous breathing, nonlabored ventilation and respiratory function stable Cardiovascular status: blood pressure returned to baseline and stable Postop Assessment: no apparent nausea or vomiting Anesthetic complications: no   No complications documented.  Last Vitals:  Vitals:   08/30/20 0312 08/30/20 0842  BP:  (!) 100/52  Pulse:  92  Resp: 15 19  Temp:  36.5 C  SpO2:  92%    Last Pain:  Vitals:   08/30/20 0842  TempSrc: Oral  PainSc:                  [name]

## 2020-08-31 LAB — CBC
HCT: 25.5 % — ABNORMAL LOW (ref 39.0–52.0)
Hemoglobin: 8.4 g/dL — ABNORMAL LOW (ref 13.0–17.0)
MCH: 31.9 pg (ref 26.0–34.0)
MCHC: 32.9 g/dL (ref 30.0–36.0)
MCV: 97 fL (ref 80.0–100.0)
Platelets: 137 10*3/uL — ABNORMAL LOW (ref 150–400)
RBC: 2.63 MIL/uL — ABNORMAL LOW (ref 4.22–5.81)
RDW: 13.2 % (ref 11.5–15.5)
WBC: 8.4 10*3/uL (ref 4.0–10.5)
nRBC: 0 % (ref 0.0–0.2)

## 2020-08-31 NOTE — Progress Notes (Signed)
Mobility Specialist - Progress Note   08/31/20 1519  Mobility  Activity Ambulated in hall  Level of Assistance Standby assist, set-up cues, supervision of patient - no hands on  Assistive Device None  Distance Ambulated (ft) 440 ft  Mobility Response Tolerated well  Mobility performed by Mobility specialist  $Mobility charge 1 Mobility   Pre-mobility: 100 HR During mobility: 104 HR Post-mobility: 97 HR  Pt required two brief rest breaks due to L LE fatigue. Otherwise asx. He states this was his 4th walk today. Pt back in bed after walk.   Pricilla Handler Mobility Specialist Mobility Specialist Phone: [phone]

## 2020-08-31 NOTE — Progress Notes (Signed)
Mobility Specialist - Progress Note   08/31/20 1112  Mobility  Activity Ambulated in hall  Level of Assistance Standby assist, set-up cues, supervision of patient - no hands on  Assistive Device None  Distance Ambulated (ft) 240 ft  Mobility Response Tolerated well  Mobility performed by Mobility specialist  $Mobility charge 1 Mobility   Pre-mobility: 82 HR Post-mobility: 109 HR, 137/75 BP  Distance limited by L LE fatigue, which he says fatigues quicker than his R LE. Pt sitting up in recliner after walk.  Pricilla Handler Mobility Specialist Mobility Specialist Phone: [phone]

## 2020-08-31 NOTE — Progress Notes (Addendum)
Pt called out due to RLE JP drain has come out.  Site looks clean, small amount of drainage.  Gauze compression dressing applied.

## 2020-08-31 NOTE — Progress Notes (Signed)
Mobility Specialist - Progress Note   08/31/20 1634  Mobility  Activity Ambulated in hall  Level of Assistance Standby assist, set-up cues, supervision of patient - no hands on  Assistive Device None  Distance Ambulated (ft) 440 ft  Mobility Response Tolerated well  Mobility performed by Mobility specialist  $Mobility charge 1 Mobility   Pre-mobility: 84 HR During mobility: 104 HR Post-mobility: 101 HR  Pt required one brief standing rest breaks due to L LE weakness. Pt to bathroom after walk, wife in room.   Pricilla Handler Mobility Specialist Mobility Specialist Phone: [phone]

## 2020-08-31 NOTE — Progress Notes (Signed)
[facility] [place]  Subjective  - feels ok, minimal leg pain   Objective 119/67 77 97.6 F (36.4 C) (Oral) 18 92%  Intake/Output Summary (Last 24 hours) at 08/31/2020 0743 Last data filed at 08/31/2020 0600 Gross per 24 hour  Intake 3091.63 ml  Output 4440 ml  Net -1348.37 ml   Right calf soft no real pain Right groin flat no hematoma left groin no hematoma Feet pink warm with doppler signals Abdomen soft  Assessment/Planning: POD #2 EVAR with right femoral endarterectomy and tibial embolectomy Overall improving hemoglobin still drifting but asymptomatic no hypotension or tachycardia overnight  Will saline lock IV Plan for d/c tomorrow if hemoglobin stable Wife updated by phone  [name] 08/31/2020 7:43 AM --  Laboratory Lab Results: Recent Labs    08/30/20 0520 08/31/20 0101  WBC 10.9* 8.4  HGB 9.4* 8.4*  HCT 28.1* 25.5*  PLT 143* 137*   BMET Recent Labs    08/29/20 1551 08/30/20 0520  NA 137 133*  K 4.2 3.9  CL 108 104  CO2 21* 22  GLUCOSE 177* 144*  BUN 15 12  CREATININE 0.89 0.80  CALCIUM 7.7* 7.5*    COAG Lab Results  Component Value Date   INR 1.2 08/29/2020   INR 1.3 (H) 08/29/2020   INR 1.1 08/24/2020   No results found for: PTT

## 2020-09-01 LAB — CBC
HCT: 26.7 % — ABNORMAL LOW (ref 39.0–52.0)
Hemoglobin: 9 g/dL — ABNORMAL LOW (ref 13.0–17.0)
MCH: 32 pg (ref 26.0–34.0)
MCHC: 33.7 g/dL (ref 30.0–36.0)
MCV: 95 fL (ref 80.0–100.0)
Platelets: 136 10*3/uL — ABNORMAL LOW (ref 150–400)
RBC: 2.81 MIL/uL — ABNORMAL LOW (ref 4.22–5.81)
RDW: 13.2 % (ref 11.5–15.5)
WBC: 7.7 10*3/uL (ref 4.0–10.5)
nRBC: 0 % (ref 0.0–0.2)

## 2020-09-01 LAB — BASIC METABOLIC PANEL
Anion gap: 7 (ref 5–15)
BUN: 5 mg/dL — ABNORMAL LOW (ref 8–23)
CO2: 26 mmol/L (ref 22–32)
Calcium: 8 mg/dL — ABNORMAL LOW (ref 8.9–10.3)
Chloride: 106 mmol/L (ref 98–111)
Creatinine, Ser: 0.74 mg/dL (ref 0.61–1.24)
GFR, Estimated: >60 mL/min (ref >60)
Glucose, Bld: 115 mg/dL — ABNORMAL HIGH (ref 70–99)
Potassium: 3.4 mmol/L — ABNORMAL LOW (ref 3.5–5.1)
Sodium: 139 mmol/L (ref 135–145)

## 2020-09-01 NOTE — Progress Notes (Signed)
Mobility Specialist: Progress Note   09/01/20 1116  Therapy Vitals  Temp (!) 97.3 F (36.3 C)  Temp Source Oral  Pulse Rate 79  Resp 20  BP 128/69  Patient Position (if appropriate) Sitting  Oxygen Therapy  SpO2 95 %  Mobility  Activity Ambulated in hall  Level of Assistance Independent  Assistive Device None  Distance Ambulated (ft) 470 ft  Mobility Response Tolerated well  Mobility performed by Mobility specialist  Bed Position Chair  $Mobility charge 1 Mobility   Pre-Mobility: 83 HR, 128/69 BP, 96% SpO2 Post-Mobility: 97 HR, 138/69 BP, 95% SpO2  Pt stopped to take a short standing rest break due to L LE feeling like it was cramping. Pt otherwise asx. Pt back to chair after walk.   [facility] Cory [name] Mobility Specialist

## 2020-09-01 NOTE — Progress Notes (Addendum)
  Progress Note    09/01/2020 7:40 AM 3 Days Post-Op  Subjective:  Says he rolled over and developed thin bloody drainage from right groin. No other complaints.   Vitals:   08/31/20 2357 09/01/20 0439  BP: 134/74 113/65  Pulse: 89 72  Resp: 17 11  Temp: 98.3 F (36.8 C) 98.2 F (36.8 C)  SpO2: 95%     Physical Exam: General appearance: Awake, alert in no apparent distress Cardiac: Heart rate and rhythm are regular Respirations: Nonlabored Incisions: Groin, thigh and lower leg incisions are all well approximated without bleeding or hematoma Extremities: Persistent, moderate edema of RLE. Calf is soft. Lower leg incision dry/intact. Right groin is draining serosanguineous fluid. Both feet are warm with intact sensation and motor function.  Brisk dorsalis pedis, posterior tibial  artery Doppler signals   CBC    Component Value Date/Time   WBC 7.7 09/01/2020 0247   RBC 2.81 (L) 09/01/2020 0247   HGB 9.0 (L) 09/01/2020 0247   HGB 16.2 05/08/2019 1115   HCT 26.7 (L) 09/01/2020 0247   HCT 49.1 05/08/2019 1115   PLT 136 (L) 09/01/2020 0247   PLT CANCELED 05/08/2019 1115   MCV 95.0 09/01/2020 0247   MCV 98 (H) 05/08/2019 1115   MCH 32.0 09/01/2020 0247   MCHC 33.7 09/01/2020 0247   RDW 13.2 09/01/2020 0247   RDW 12.0 05/08/2019 1115   LYMPHSABS 2.5 04/01/2013 1248   MONOABS 0.7 04/01/2013 1248   EOSABS 0.2 04/01/2013 1248   BASOSABS 0.0 04/01/2013 1248    BMET    Component Value Date/Time   NA 139 09/01/2020 0247   NA 141 05/16/2018 1425   K 3.4 (L) 09/01/2020 0247   CL 106 09/01/2020 0247   CO2 26 09/01/2020 0247   GLUCOSE 115 (H) 09/01/2020 0247   BUN 5 (L) 09/01/2020 0247   BUN 13 05/16/2018 1425   CREATININE 0.74 09/01/2020 0247   CALCIUM 8.0 (L) 09/01/2020 0247   GFRNONAA >60 09/01/2020 0247   GFRAA 94 05/16/2018 1425     Intake/Output Summary (Last 24 hours) at 09/01/2020 0740 Last data filed at 09/01/2020 0443 Gross per 24 hour  Intake -  Output  215 ml  Net -215 ml    HOSPITAL MEDICATIONS Scheduled Meds: . aspirin EC  81 mg Oral Q0600  . cholecalciferol  1,000 Units Oral BID  . diltiazem  120 mg Oral Daily  . docusate sodium  100 mg Oral Daily  . loratadine  10 mg Oral Daily  . pantoprazole  40 mg Oral Daily  . rosuvastatin  10 mg Oral QPM   Continuous Infusions: . magnesium sulfate bolus IVPB     PRN Meds:.acetaminophen **OR** acetaminophen, alum & mag hydroxide-simeth, guaiFENesin-dextromethorphan, hydrALAZINE, labetalol, magnesium sulfate bolus IVPB, metoprolol tartrate, metoprolol tartrate, morphine injection, ondansetron, oxyCODONE-acetaminophen, phenol, potassium chloride  Assessment and Plan: POD #3 EVAR with right femoral endarterectomy and tibial embolectomy. RLE well perfused. VSS. Afebrile Right groin is draining old blood. H and H stable. Await Dr. Oneida Alar' assessment of groin this am.  -DVT prophylaxis:  SCDs   Risa Grill, PA-C [facility] [phone] 09/01/2020  7:40 AM   Agree with above Some serosanguinous right groin drainage no focal hematoma Calf incision clean with soft compartments  Dry dressing for right groin Most likely home next 1-2 days No indication for antibiotics currently as afebrile no leukocytosis  Wife updated by phone  [name], MD [facility] [place] Office: [phone]

## 2020-09-01 NOTE — Progress Notes (Signed)
   Patient was noted to have a large seroma in R groin.  This was drained with manual compression at the bedside.  Drainage was serosanguinous.  Prevena vac was applied.  Patient has a palpable R PT pulse.  Dagoberto Ligas, PA-C [facility] [phone] 09/01/2020  3:21 PM

## 2020-09-01 NOTE — Progress Notes (Addendum)
R groin dressing saturated with moderate amount of blood. New dressing applied. Pedal pulses dopplable on both sites. Will continue to monitor the pt.   Lavenia Atlas, RN

## 2020-09-01 NOTE — Progress Notes (Signed)
Pt dressing on right groin covered with light pink serosaguenous drainage. Site was not actively bleeding. Dressing changed by RN. Patient denies pain. Will continue to monitor. Fuller Canada, RN

## 2020-09-01 NOTE — Progress Notes (Addendum)
Dressing on R groin site saturated with blood. Changed new dressing on R groin, circled hematoma site. Pt denied tingling or pain sensation. R Dorsal pedis pulse doppleable.  Pt appears asymptomatic. Page PA.

## 2020-09-02 MED ORDER — OXYCODONE-ACETAMINOPHEN 5-325 MG PO TABS: 1.0000 | ORAL_TABLET | ORAL | 0 refills | Status: DC | PRN

## 2020-09-02 NOTE — Plan of Care (Signed)
  Problem: Clinical Measurements: Goal: Respiratory complications will improve Outcome: Progressing Goal: Cardiovascular complication will be avoided Outcome: Progressing   Problem: Nutrition: Goal: Adequate nutrition will be maintained Outcome: Progressing   

## 2020-09-02 NOTE — Progress Notes (Signed)
Mobility Specialist: Progress Note   09/02/20 1110  Mobility  Activity Refused mobility   Pt refused mobility, no reason specified. Pt hopeful for discharge soon.   [facility] Demia Viera Mobility Specialist Mobility Specialist Phone: [phone]

## 2020-09-02 NOTE — Care Management Important Message (Signed)
Important Message  Patient Details  Name: Cory [name] MRN: [id number] Date of Birth: 1945/10/03   Medicare Important Message Given:  Yes     [name] 09/02/2020, 8:56 AM

## 2020-09-02 NOTE — Progress Notes (Signed)
D/C instructions given to patient and wife. Wound care and preevena vac instructions reviewed. Medications reviewed. All questions answered. IV removed, clean and intact. Wife to escort pt home.  Clyde Canterbury, RN

## 2020-09-02 NOTE — Discharge Summary (Signed)
EVAR Discharge Summary   Cory [name] 31-Oct-1945 75 y.o. male  MRN: [id number]  Admission Date: 08/29/2020  Discharge Date: 09/02/2020 Physician: Elam Dutch, MD  Admission Diagnosis: AAA (abdominal aortic aneurysm) [facility]) [I71.4]   HPI:   This is a 74 y.o. male who presented with Abdominal aortic and common iliac aneurysm   Hospital Course:  The patient was admitted to the hospital and taken to the operating room on 08/29/2020 and underwent: Gore Excluder bifurcated stent graft for repair of common iliac aneurysm, right common femoral endarterectomy with bovine pericardial patch angioplasty right common femoral artery, right popliteal and tibial embolectomy with patch angioplasty of right below-knee popliteal artery with bovine pericardium.     Findings: 1.  3.8 cm abdominal aortic aneurysm  2.  Right common iliac artery 4.4 cm.  3.  35 x 14 x 18 main body Gore Excluder graft delivered via right femoral system, 12 x 12 cm right external iliac extension, 18 French sheath  4.  Left limb 23 x 12 cm, 14 French sheath  The pt tolerated the procedure well and was transported to the PACU in excellent condition.   He wass noted to bloody JP output while recovering in PACU. This was closely monitored and he was administered protamine. This resolved later that evening. He was mildly hypotensive and was given NS bolus. His hemoglobin remained stable and did not require transfusion.   By POD 1, his vital signs were stable and he was out of bed and beginning to ambulate. He was voiding spontaneously and had excellent UOP.  His drain was removed. He had lower extremity edema which was present prior to admission. His calf was soft. On POD 3 he developed serosanguineous drainage from his right groin incision. A Prevena dressing was put in place. His RLE remained well perfused. He was tolerating his diet and bowel function was normal.  On POD 4 he was ready for discharge home. He  will resume his Xarelto on 09/03/2020. He will resume Crestor 20 mg as prescribed on discharge previously.  The remainder of the hospital course consisted of increasing mobilization and increasing intake of solids without difficulty.  CBC    Component Value Date/Time   WBC 7.7 09/01/2020 0247   RBC 2.81 (L) 09/01/2020 0247   HGB 9.0 (L) 09/01/2020 0247   HGB 16.2 05/08/2019 1115   HCT 26.7 (L) 09/01/2020 0247   HCT 49.1 05/08/2019 1115   PLT 136 (L) 09/01/2020 0247   PLT CANCELED 05/08/2019 1115   MCV 95.0 09/01/2020 0247   MCV 98 (H) 05/08/2019 1115   MCH 32.0 09/01/2020 0247   MCHC 33.7 09/01/2020 0247   RDW 13.2 09/01/2020 0247   RDW 12.0 05/08/2019 1115   LYMPHSABS 2.5 04/01/2013 1248   MONOABS 0.7 04/01/2013 1248   EOSABS 0.2 04/01/2013 1248   BASOSABS 0.0 04/01/2013 1248    BMET    Component Value Date/Time   NA 139 09/01/2020 0247   NA 141 05/16/2018 1425   K 3.4 (L) 09/01/2020 0247   CL 106 09/01/2020 0247   CO2 26 09/01/2020 0247   GLUCOSE 115 (H) 09/01/2020 0247   BUN 5 (L) 09/01/2020 0247   BUN 13 05/16/2018 1425   CREATININE 0.74 09/01/2020 0247   CALCIUM 8.0 (L) 09/01/2020 0247   GFRNONAA >60 09/01/2020 0247   GFRAA 94 05/16/2018 1425       Discharge Instructions    Discharge patient   Complete by: As directed  Discharge disposition: 01-Home or Self Care   Discharge patient date: 09/02/2020      Discharge Diagnosis:  AAA (abdominal aortic aneurysm) [facility]) [I71.4]  Secondary Diagnosis: Patient Active Problem List   Diagnosis Date Noted  . AAA (abdominal aortic aneurysm) ([place]) 08/29/2020  . PAD (peripheral artery disease) ([place]) 06/25/2020  . Educated about COVID-19 virus infection 05/12/2020  . Tobacco abuse 05/12/2020  . Peripheral vascular disease ([place]) 04/25/2020  . Abdominal aortic aneurysm (AAA) ([place]) 04/25/2020  . Essential hypertension 05/16/2018  . Medication management 05/16/2018  . Piriformis syndrome of right side 05/06/2018   . Allergy 10/29/2017  . Recurrent sinusitis 10/29/2017  . Atrial fibrillation, transient ([place]) 06/07/2015  . Aortic stenosis   . ABNORMAL ELECTROCARDIOGRAM 07/14/2009   Past Medical History:  Diagnosis Date  . Aortic stenosis    a. Mild by echo 04/2015.  . Arthritis    right hip  . PAF (paroxysmal atrial fibrillation) ([place])   . Peripheral arterial disease ([place])   . Tobacco abuse      Allergies as of 09/02/2020      Reactions   Azithromycin Diarrhea   2,4-d Dimethylamine (amisol) Other (See Comments), Itching      Medication List    STOP taking these medications   ciprofloxacin 500 MG tablet Commonly known as: CIPRO     TAKE these medications   acetaminophen 500 MG tablet Commonly known as: TYLENOL Take 500 mg by mouth every 6 (six) hours as needed (for pain.).   cetirizine 10 MG tablet Commonly known as: ZYRTEC Take 1 tablet (10 mg total) by mouth daily. What changed:   when to take this  reasons to take this   cholecalciferol 25 MCG (1000 UNIT) tablet Commonly known as: VITAMIN D Take 1,000 Units by mouth 2 (two) times daily.   diltiazem 120 MG 24 hr capsule Commonly known as: TIAZAC Take 1 capsule by mouth once daily   fluticasone 50 MCG/ACT nasal spray Commonly known as: FLONASE Place 2 sprays into both nostrils daily. What changed:   when to take this  reasons to take this   metoprolol tartrate 25 MG tablet Commonly known as: LOPRESSOR Take 1 tablet by mouth twice daily as needed What changed: reasons to take this   oxyCODONE-acetaminophen 5-325 MG tablet Commonly known as: PERCOCET/ROXICET Take 1 tablet by mouth every 4 (four) hours as needed for moderate pain.   rivaroxaban 20 MG Tabs tablet Commonly known as: Xarelto Take 1 tablet (20 mg total) by mouth daily with supper.   rosuvastatin 20 MG tablet Commonly known as: CRESTOR Take 1 tablet (20 mg total) by mouth every evening. What changed: Another medication with the same name was  removed. Continue taking this medication, and follow the directions you see here.       Discharge Instructions:  [facility]  Discharge Instructions Endovascular Aortic Aneurysm Repair  Please refer to the following instructions for your post-procedure care. Your surgeon or Physician Assistant will discuss any changes with you.  Activity  You are encouraged to walk as much as you can. You can slowly return to normal activities but must avoid strenuous activity and heavy lifting until your doctor tells you it's OK. Avoid activities such as vacuuming or swinging a gold club. It is normal to feel tired for several weeks after your surgery. Do not drive until your doctor gives the OK and you are no longer taking prescription pain medications. It is also normal to have difficulty  with sleep habits, eating, and bowel movements after surgery. These will go away with time.  Bathing/Showering  You may shower after you go home. If you have an incision, do not soak in a bathtub, hot tub, or swim until the incision heals completely.  Incision Care  Shower every day. Clean your incision with mild soap and water. Pat the area dry with a clean towel. You do not need a bandage unless otherwise instructed. Do not apply any ointments or creams to your incision. If you clothing is irritating, you may cover your incision with a dry gauze pad.  Diet  Resume your normal diet. There are no special food restrictions following this procedure. A low fat/low cholesterol diet is recommended for all patients with vascular disease. In order to heal from your surgery, it is CRITICAL to get adequate nutrition. Your body requires vitamins, minerals, and protein. Vegetables are the best source of vitamins and minerals. Vegetables also provide the perfect balance of protein. Processed food has little nutritional value, so try to avoid this.  Medications  Resume taking all of your  medications unless your doctor or Physician Assistnat tells you not to. If your incision is causing pain, you may take over-the-counter pain relievers such as acetaminophen (Tylenol). If you were prescribed a stronger pain medication, please be aware these medications can cause nausea and constipation. Prevent nausea by taking the medication with a snack or meal. Avoid constipation by drinking plenty of fluids and eating foods with a high amount of fiber, such as fruits, vegetables, and grains.  Do not take Tylenol if you are taking prescription pain medications.   Follow up  [place] office will schedule a follow-up appointment with a C.T. scan 3-4 weeks after your surgery.  Please call us immediately for any of the following conditions  . Severe or worsening pain in your legs or feet or in your abdomen back or chest. . Increased pain, redness, drainage (pus) from your incision sit. . Increased abdominal pain, bloating, nausea, vomiting or persistent diarrhea. . Fever of 101 degrees or higher. . Swelling in your leg (s), .  Reduce your risk of vascular disease  .Stop smoking. If you would like help call [facility] at 1-800-QUIT-NOW (418)043-1825) or [place] at ([phone])600-6215. .Manage your cholesterol .Maintain a desired weight .Control your diabetes .Keep your blood pressure down  If you have questions, please call the office at [phone].   Prescriptions given: 1.  Roxicet #20 No Refill   Disposition: home  Patient's condition: is Excellent  Follow up: 1. Dr. Oneida Alar in 2 weeks for groin incision check   Risa Grill, PA-C [facility] [phone] 09/02/2020  10:45 AM   - For VQI Registry use - Post-op:  Time to Extubation: [x]  In OR, [ ]  < 12 hrs, [ ]  12-24 hrs, [ ]  >=24 hrs Vasopressors Req. Post-op: No MI: No., [ ]  Troponin only, [ ]  EKG or Clinical New Arrhythmia: No CHF: No ICU Stay: 0 day in 0 Transfusion: No     If yes, 0 units  given  Complications: Resp failure: No., [ ]  Pneumonia, [ ]  Ventilator Chg in renal function: No., [ ]  Inc. Cr > 0.5, [ ]  Temp. Dialysis,  [ ]  Permanent dialysis Leg ischemia: No., no Surgery needed, [ ]  Yes, Surgery needed,  [ ]  Amputation Bowel ischemia: No., [ ]  Medical Rx, [ ]  Surgical Rx Wound complication: Yes.  , [x ] Superficial separation/infection, [ ]  Return to OR Return to  OR: No  Return to OR for bleeding: No Stroke: No., [ ]  Minor, [ ]  Major  Discharge medications: Statin use:  Yes  ASA use:  Yes  Plavix use:  No  Beta blocker use:  Yes  ARB use:  No ACEI use:  No CCB use:  Yes Xarelto

## 2020-09-02 NOTE — Discharge Instructions (Signed)
[facility]   Discharge Instructions  Endovascular Aortic Aneurysm Repair  Please refer to the following instructions for your post-procedure care. Your surgeon or Physician Assistant will discuss any changes with you.  Activity  You are encouraged to walk as much as you can. You can slowly return to normal activities but must avoid strenuous activity and heavy lifting until your doctor tells you it's OK. Avoid activities such as vacuuming or swinging a gold club. It is normal to feel tired for several weeks after your surgery. Do not drive until your doctor gives the OK and you are no longer taking prescription pain medications. It is also normal to have difficulty with sleep habits, eating, and bowel movements after surgery. These will go away with time.  Bathing/Showering  You may shower after you go home. If you have an incision, do not soak in a bathtub, hot tub, or swim until the incision heals completely.  Incision Care  Shower every day. Clean your incision with mild soap and water. Pat the area dry with a clean towel. You do not need a bandage unless otherwise instructed. Do not apply any ointments or creams to your incision. If you clothing is irritating, you may cover your incision with a dry gauze pad.  Diet  Resume your normal diet. There are no special food restrictions following this procedure. A low fat/low cholesterol diet is recommended for all patients with vascular disease. In order to heal from your surgery, it is CRITICAL to get adequate nutrition. Your body requires vitamins, minerals, and protein. Vegetables are the best source of vitamins and minerals. Vegetables also provide the perfect balance of protein. Processed food has little nutritional value, so try to avoid this.  Medications  Resume Xarelto TOMORROW 09/03/20  Resume taking all of your other medications unless your doctor or nurse practitioner tells you not to. If your  incision is causing pain, you may take over-the-counter pain relievers such as acetaminophen (Tylenol). If you were prescribed a stronger pain medication, please be aware these medications can cause nausea and constipation. Prevent nausea by taking the medication with a snack or meal. Avoid constipation by drinking plenty of fluids and eating foods with a high amount of fiber, such as fruits, vegetables, and grains. Do not take Tylenol if you are taking prescription pain medications.   Follow up  [place] office will schedule a follow-up appointment with a C.T. scan 3-4 weeks after your surgery.  Please call us immediately for any of the following conditions  Severe or worsening pain in your legs or feet or in your abdomen back or chest. Increased pain, redness, drainage (pus) from your incision sit. Increased abdominal pain, bloating, nausea, vomiting or persistent diarrhea. Fever of 101 degrees or higher. Swelling in your leg (s),  Reduce your risk of vascular disease  .Stop smoking. If you would like help call [facility] at 1-800-QUIT-NOW [phone]) or [place] at [phone]. .Manage your cholesterol .Maintain a desired weight .Control your diabetes .Keep your blood pressure down  If you have questions, please call the office at [phone].   Leave the sponge dressing in place on your groin for one week and then simply peel it off and discard the sponge and the pump.  If the battery dies you may use the charger that is with the pump or you may remove the dressing sooner.  If the sponge loses the suction or the pump quits working, you may remove the sponge.  Your incision is closed with suture that will eventually dissolve slowly over time.   Resume Xarelto tomorrow 09/03/2020  Resume Xarelto TOMORROW 09/03/20  Information on my medicine - XARELTO (Rivaroxaban)  This medication education was reviewed with me or my healthcare representative as part of my discharge  preparation.  The pharmacist that spoke with me during my hospital stay was:  Pat Patrick, [facility]  Why was Xarelto prescribed for you? Xarelto was prescribed for you to reduce the risk of a blood clot forming that can cause a stroke if you have a medical condition called atrial fibrillation (a type of irregular heartbeat).  What do you need to know about xarelto ? Take your Xarelto ONCE DAILY at the same time every day with your evening meal. If you have difficulty swallowing the tablet whole, you may crush it and mix in applesauce just prior to taking your dose.  Take Xarelto exactly as prescribed by your doctor and DO NOT stop taking Xarelto without talking to the doctor who prescribed the medication.  Stopping without other stroke prevention medication to take the place of Xarelto may increase your risk of developing a clot that causes a stroke.  Refill your prescription before you run out.  After discharge, you should have regular check-up appointments with your healthcare provider that is prescribing your Xarelto.  In the future your dose may need to be changed if your kidney function or weight changes by a significant amount.  What do you do if you miss a dose? If you are taking Xarelto ONCE DAILY and you miss a dose, take it as soon as you remember on the same day then continue your regularly scheduled once daily regimen the next day. Do not take two doses of Xarelto at the same time or on the same day.   Important Safety Information A possible side effect of Xarelto is bleeding. You should call your healthcare provider right away if you experience any of the following: ? Bleeding from an injury or your nose that does not stop. ? Unusual colored urine (red or dark brown) or unusual colored stools (red or black). ? Unusual bruising for unknown reasons. ? A serious fall or if you hit your head (even if there is no bleeding).  Some medicines may interact with Xarelto and  might increase your risk of bleeding while on Xarelto. To help avoid this, consult your healthcare provider or pharmacist prior to using any new prescription or non-prescription medications, including herbals, vitamins, non-steroidal anti-inflammatory drugs (NSAIDs) and supplements.  This website has more information on Xarelto: [email or web address]

## 2020-09-02 NOTE — Progress Notes (Addendum)
.    Progress Note    09/02/2020 7:43 AM 4 Days Post-Op  Subjective:  No complaints   Vitals:   09/02/20 0004 09/02/20 0416  BP: 118/68 119/68  Pulse: 71 69  Resp: 16 18  Temp: 97.9 F (36.6 C) 98 F (36.7 C)  SpO2: 96% 96%    Physical Exam: General appearance: Awake, alert in no apparent distress Cardiac: Heart rate and rhythm are regular Respirations: Nonlabored Incisions: Groin, thigh and lower leg incisions are all well approximated without bleeding or hematoma Extremities: Persistent, moderate edema of RLE. Calf is soft. Lower leg incision dry/intact. Right groin with Prevena in place and scant output. Right foot is warm with intact sensation and motor function.  Brisk dorsalis pedis, posterior tibial  artery Doppler signals   CBC    Component Value Date/Time   WBC 7.7 09/01/2020 0247   RBC 2.81 (L) 09/01/2020 0247   HGB 9.0 (L) 09/01/2020 0247   HGB 16.2 05/08/2019 1115   HCT 26.7 (L) 09/01/2020 0247   HCT 49.1 05/08/2019 1115   PLT 136 (L) 09/01/2020 0247   PLT CANCELED 05/08/2019 1115   MCV 95.0 09/01/2020 0247   MCV 98 (H) 05/08/2019 1115   MCH 32.0 09/01/2020 0247   MCHC 33.7 09/01/2020 0247   RDW 13.2 09/01/2020 0247   RDW 12.0 05/08/2019 1115   LYMPHSABS 2.5 04/01/2013 1248   MONOABS 0.7 04/01/2013 1248   EOSABS 0.2 04/01/2013 1248   BASOSABS 0.0 04/01/2013 1248    BMET    Component Value Date/Time   NA 139 09/01/2020 0247   NA 141 05/16/2018 1425   K 3.4 (L) 09/01/2020 0247   CL 106 09/01/2020 0247   CO2 26 09/01/2020 0247   GLUCOSE 115 (H) 09/01/2020 0247   BUN 5 (L) 09/01/2020 0247   BUN 13 05/16/2018 1425   CREATININE 0.74 09/01/2020 0247   CALCIUM 8.0 (L) 09/01/2020 0247   GFRNONAA >60 09/01/2020 0247   GFRAA 94 05/16/2018 1425     Intake/Output Summary (Last 24 hours) at 09/02/2020 0743 Last data filed at 09/02/2020 0120 Gross per 24 hour  Intake 0 ml  Output -  Net 0 ml    HOSPITAL MEDICATIONS Scheduled Meds: . aspirin  EC  81 mg Oral Q0600  . cholecalciferol  1,000 Units Oral BID  . diltiazem  120 mg Oral Daily  . docusate sodium  100 mg Oral Daily  . loratadine  10 mg Oral Daily  . pantoprazole  40 mg Oral Daily  . rosuvastatin  10 mg Oral QPM   Continuous Infusions: . magnesium sulfate bolus IVPB     PRN Meds:.acetaminophen **OR** acetaminophen, alum & mag hydroxide-simeth, guaiFENesin-dextromethorphan, hydrALAZINE, labetalol, magnesium sulfate bolus IVPB, metoprolol tartrate, metoprolol tartrate, morphine injection, ondansetron, oxyCODONE-acetaminophen, phenol  Assessment and Plan: POD #4 EVAR with right femoral endarterectomy and tibial embolectomy. RLE well perfused. VSS. Afebrile. T max 99.7 Tolerating diet. Right groin is drained SS fluid yesterday. Prevena in place. H and H stable. Discharge home. Continue statin and asa  -DVT prophylaxis:  SCDs    Risa Grill, PA-C [facility] [phone] 09/02/2020  7:43 AM   Minimal right groin drainage overnight Provena with good seal Below knee incision looks good Good doppler flow to both feet Will d/c home today Start Xarelto tomorrow  Will arrange follow up visit in 2 weeks Wife updated by phone  [name], MD [facility] [place] Office: [phone]

## 2020-09-05 ENCOUNTER — Telehealth: Payer: Self-pay

## 2020-09-05 NOTE — Telephone Encounter (Signed)
Patient called to report Cory [name] is nearly full after 4 days. He is not having much pain or swelling. Advised patient to leave Prevena on til it quit working then peel off and place dry gauze. Placed him on PA schedule this week for wound evaluation and left F/U appt with CEF in place. Patient verbalizes understanding.

## 2020-09-06 ENCOUNTER — Ambulatory Visit (INDEPENDENT_AMBULATORY_CARE_PROVIDER_SITE_OTHER): Payer: Medicare HMO | Admitting: Physician Assistant

## 2020-09-06 ENCOUNTER — Other Ambulatory Visit: Payer: Self-pay

## 2020-09-06 VITALS — BP 109/64 | HR 75 | Temp 98.3°F | Wt 224.9 lb

## 2020-09-06 DIAGNOSIS — L7634 Postprocedural seroma of skin and subcutaneous tissue following other procedure: Secondary | ICD-10-CM

## 2020-09-06 NOTE — Progress Notes (Signed)
POST OPERATIVE OFFICE NOTE    CC:  F/u for surgery 08/29/2020  HPI:  This is a 75 y.o. male who is s/p EVAR with Gore Excluder bifurcated stent graft for repair of common iliac aneurysm  2.  Right common femoral endarterectomy with bovine pericardial patch angioplasty right common femoral artery  3.  Right popliteal and tibial embolectomy with patch angioplasty of right below-knee popliteal artery with bovine pericardium  He developed drainage from right groin prior to discharge and a Prevena dressing was placed.  The reservoir filled with serosanguineous drainage and he removed the Prevena dressing yesterday.  He has persistent edema but overall feels well and denies fever or chills.   Allergies  Allergen Reactions  . Azithromycin Diarrhea  . 2,4-D Dimethylamine (Amisol) Other (See Comments) and Itching    Current Outpatient Medications  Medication Sig Dispense Refill  . acetaminophen (TYLENOL) 500 MG tablet Take 500 mg by mouth every 6 (six) hours as needed (for pain.).    Marland Kitchen cetirizine (ZYRTEC) 10 MG tablet Take 1 tablet (10 mg total) by mouth daily. (Patient taking differently: Take 10 mg by mouth daily as needed for allergies.) 30 tablet 11  . cholecalciferol (VITAMIN D) 25 MCG (1000 UNIT) tablet Take 1,000 Units by mouth 2 (two) times daily.    Marland Kitchen diltiazem (TIAZAC) 120 MG 24 hr capsule Take 1 capsule by mouth once daily (Patient taking differently: Take 120 mg by mouth daily.) 90 capsule 0  . fluticasone (FLONASE) 50 MCG/ACT nasal spray Place 2 sprays into both nostrils daily. (Patient taking differently: Place 2 sprays into both nostrils daily as needed for allergies.) 16 g 6  . metoprolol tartrate (LOPRESSOR) 25 MG tablet Take 1 tablet by mouth twice daily as needed (Patient taking differently: Take 25 mg by mouth 2 (two) times daily as needed (afib).) 30 tablet 0  . oxyCODONE-acetaminophen (PERCOCET/ROXICET) 5-325 MG tablet Take 1 tablet by mouth every 4 (four) hours as needed  for moderate pain. 15 tablet 0  . rivaroxaban (XARELTO) 20 MG TABS tablet Take 1 tablet (20 mg total) by mouth daily with supper. 30 tablet 11  . rosuvastatin (CRESTOR) 20 MG tablet Take 1 tablet (20 mg total) by mouth every evening. (Patient not taking: Reported on 08/19/2020)     No current facility-administered medications for this visit.     ROS:  See HPI  There were no vitals taken for this visit.  Physical Exam:  General appearance: Well-developed, well-nourished in no apparent distress Cardiac: Rate and rhythm are regular Respiratory: Nonlabored Incision: Right groin incision is well approximated, slightly macerated with serosanguineous drainage on gauze dressing.  There is firm edema superiorly of his groin incision. There is ecchymosis of both groins.  Left groin is soft.  Right lower leg incision is well approximated. Extremities: Moderate right lower extremity edema.  2+ right DP and PT palpable pulses Neuro: Alert and oriented x4      Assessment/Plan:  This is a 75 y.o. male who is s/p: Abdominal aortic stent graft with right common femoral endarterectomy with bovine patch angioplasty.  There is sanguineous drainage from right groin wound without signs of infection.  He had chronic right lower extremity edema prior to surgery.  Encouraged elevation. Advised he and his wife to keep dry gauze dressing to right groin at all times and change frequently. He has a follow-up appointment on February 17 with Dr. Oneida Alar and will obtain CTA prior to that visit.    Risa Grill, PA-C [facility] [phone]  Clinic MD:  Carlis Abbott

## 2020-09-07 ENCOUNTER — Ambulatory Visit: Payer: Medicare HMO

## 2020-09-14 ENCOUNTER — Telehealth: Payer: Self-pay

## 2020-09-14 NOTE — Telephone Encounter (Signed)
Patient called about his groin incision. Seen last week and has f/u in a week. Incision is draining less, but otherwise unchanged. He denies fever, chills, or prurient drainage. Advised him to keep follow up appt and call back sooner if s/s of infection develop. Patient verbalizes understanding.

## 2020-09-15 ENCOUNTER — Ambulatory Visit
Admission: RE | Admit: 2020-09-15 | Discharge: 2020-09-15 | Disposition: A | Discharge status: Home / Self Care | Payer: Medicare HMO | Source: Ambulatory Visit | Attending: Vascular Surgery | Admitting: Vascular Surgery

## 2020-09-15 ENCOUNTER — Other Ambulatory Visit: Payer: Self-pay

## 2020-09-15 DIAGNOSIS — I723 Aneurysm of iliac artery: Secondary | ICD-10-CM | POA: Diagnosis not present

## 2020-09-15 DIAGNOSIS — N281 Cyst of kidney, acquired: Secondary | ICD-10-CM | POA: Diagnosis not present

## 2020-09-15 DIAGNOSIS — I714 Abdominal aortic aneurysm, without rupture, unspecified: Secondary | ICD-10-CM

## 2020-09-15 DIAGNOSIS — I712 Thoracic aortic aneurysm, without rupture: Secondary | ICD-10-CM | POA: Diagnosis not present

## 2020-09-15 MED ORDER — IOPAMIDOL (ISOVUE-370) INJECTION 76%
75.0000 mL | Freq: Once | INTRAVENOUS | Status: AC | PRN
  Administered 2020-09-15: 75 mL via INTRAVENOUS

## 2020-09-16 ENCOUNTER — Other Ambulatory Visit: Payer: Self-pay

## 2020-09-16 ENCOUNTER — Ambulatory Visit (INDEPENDENT_AMBULATORY_CARE_PROVIDER_SITE_OTHER): Payer: Medicare HMO | Admitting: Physician Assistant

## 2020-09-16 ENCOUNTER — Telehealth: Payer: Self-pay

## 2020-09-16 VITALS — BP 108/64 | HR 72 | Temp 98.3°F | Resp 20 | Ht 76.0 in | Wt 221.2 lb

## 2020-09-16 DIAGNOSIS — Z8679 Personal history of other diseases of the circulatory system: Secondary | ICD-10-CM

## 2020-09-16 DIAGNOSIS — I723 Aneurysm of iliac artery: Secondary | ICD-10-CM

## 2020-09-16 DIAGNOSIS — I739 Peripheral vascular disease, unspecified: Secondary | ICD-10-CM

## 2020-09-16 DIAGNOSIS — L7634 Postprocedural seroma of skin and subcutaneous tissue following other procedure: Secondary | ICD-10-CM

## 2020-09-16 DIAGNOSIS — Z9889 Other specified postprocedural states: Secondary | ICD-10-CM

## 2020-09-16 MED ORDER — CIPROFLOXACIN HCL 500 MG PO TABS: 500.0000 mg | ORAL_TABLET | Freq: Two times a day (BID) | ORAL | 0 refills | Status: DC

## 2020-09-16 NOTE — Telephone Encounter (Signed)
Patient called to report incision is now red, painful and drainage is increasing. He ran a fever yesterday but has since been taking tylenol. Placed patient on PA schedule today for evaluation.

## 2020-09-16 NOTE — Progress Notes (Signed)
POST OPERATIVE OFFICE NOTE    CC:  F/u for surgery  HPI:  This is a 75 y.o. male who is s/p EVAR with Simeon Craft excluder bifurcated stent graft for repair of common iliac aneurysm, right common femoral endarterectomy with bovine pericardial patch angioplasty and right popliteal and tibial embolectomy with patch angioplasty of the right below knee popliteal artery with bovine pericardium on 08/29/20 by Dr. Oneida Alar. He was recently seen on 09/06/20 by Risa Grill PA-C, at which time he was doing well. He was having some sanguinous drainage from the right groin incision without any evidence of infection. She instructed him to keep area clean and dry and was schedule to keep his post op follow up on 09/22/20 with Dr. Oneida Alar.  He presents today with concerns of increased redness, pain, and fevers which started a day and half ago. He reports temperature of 99.8 yesterday. Improved with Tylenol. Wife says she has been trying very hard to keep groin clean and dry since he cant really see the area. She has noticed increased clear-yellow blood tinged drainage and skin around more red then several days ago when he was here. Are has some increased tenderness as well which the tylenol is also helping well. Otherwise he denies any pain in the right thigh or leg. No nausea or vomiting. No chills  Allergies  Allergen Reactions  . Azithromycin Diarrhea  . 2,4-D Dimethylamine (Amisol) Other (See Comments) and Itching    Current Outpatient Medications  Medication Sig Dispense Refill  . acetaminophen (TYLENOL) 500 MG tablet Take 500 mg by mouth every 6 (six) hours as needed (for pain.).    Marland Kitchen cetirizine (ZYRTEC) 10 MG tablet Take 1 tablet (10 mg total) by mouth daily. (Patient taking differently: Take 10 mg by mouth daily as needed for allergies.) 30 tablet 11  . cholecalciferol (VITAMIN D) 25 MCG (1000 UNIT) tablet Take 1,000 Units by mouth 2 (two) times daily.    . ciprofloxacin (CIPRO) 500 MG tablet Take 1 tablet (500  mg total) by mouth 2 (two) times daily. 20 tablet 0  . diltiazem (TIAZAC) 120 MG 24 hr capsule Take 1 capsule by mouth once daily (Patient taking differently: Take 120 mg by mouth daily.) 90 capsule 0  . fluticasone (FLONASE) 50 MCG/ACT nasal spray Place 2 sprays into both nostrils daily. (Patient taking differently: Place 2 sprays into both nostrils daily as needed for allergies.) 16 g 6  . metoprolol tartrate (LOPRESSOR) 25 MG tablet Take 1 tablet by mouth twice daily as needed (Patient taking differently: Take 25 mg by mouth 2 (two) times daily as needed (afib).) 30 tablet 0  . oxyCODONE-acetaminophen (PERCOCET/ROXICET) 5-325 MG tablet Take 1 tablet by mouth every 4 (four) hours as needed for moderate pain. 15 tablet 0  . rivaroxaban (XARELTO) 20 MG TABS tablet Take 1 tablet (20 mg total) by mouth daily with supper. 30 tablet 11  . rosuvastatin (CRESTOR) 20 MG tablet Take 1 tablet (20 mg total) by mouth every evening.     No current facility-administered medications for this visit.     ROS:  See HPI  Physical Exam:  Vitals:   09/16/20 1116  BP: 108/64  Pulse: 72  Resp: 20  Temp: 98.3 F (36.8 C)  TempSrc: Temporal  SpO2: 96%  Weight: 221 lb 3.2 oz (100.3 kg)  Height: 6\' 4"  (1.93 m)    Incision:right groin incision is intact with some maceration along incision line. Serosanguinous drainage from mid and proximal incision line. There  is area of firmness present superior to the incision which likely is a seroma. Otherwise area is soft. Mild erythema is present. Hard to tell if this is just irritant due to drainage and gauze vs infection. Not overtly infected appearing     Extremities:  Bilateral lower extremities are well perfused and warm Neuro: alert and oriented   Assessment/Plan:  This is a 75 y.o. male who is s/p EVAR with Simeon Craft excluder bifurcated stent graft for repair of common iliac aneurysm, right common femoral endarterectomy with bovine pericardial patch angioplasty and  right popliteal and tibial embolectomy with patch angioplasty of the right below knee popliteal artery with bovine pericardium on 08/29/20 by Dr. Oneida Alar. He presents today with concerns of increased redness, drainage, pain and fever. He does have serosanguinous drainage which has been present since time of surgery. This was initially slowing down but over past couple days seems to have increased. With having had multiple surgeries in this area explained that this is common to have serous drainage. Recommend continued observation of this area vs washout. Wound does not appear overtly infected but due to mild erythema and low grade fever I have prescribed him Cipro 500 mg BID x 10 days - I advised patient and his wife that if wound appears worse or increased redness, fevers, pain to call for earlier follow up - He will keep his follow up appointment on 2/17 with Dr. Jeralene Peters, PA-C [facility] [phone]  Clinic MD: Dr. Donzetta Matters

## 2020-09-19 ENCOUNTER — Telehealth: Payer: Self-pay | Admitting: Family Medicine

## 2020-09-19 NOTE — Telephone Encounter (Signed)
Left message for patient to call back and schedule Medicare Annual Wellness Visit (AWV) either virtually or in office. No detailed information left   Last AWV 06/05/17  please schedule at anytime with [facility] Nurse Health Advisor 1 or 2   This should be a 45 minute visit.

## 2020-09-22 ENCOUNTER — Ambulatory Visit (INDEPENDENT_AMBULATORY_CARE_PROVIDER_SITE_OTHER): Payer: Self-pay | Admitting: Vascular Surgery

## 2020-09-22 ENCOUNTER — Other Ambulatory Visit: Payer: Self-pay

## 2020-09-22 ENCOUNTER — Encounter: Payer: Self-pay | Admitting: Vascular Surgery

## 2020-09-22 VITALS — BP 130/82 | HR 81 | Temp 98.3°F | Resp 20 | Ht 76.0 in | Wt 226.0 lb

## 2020-09-22 DIAGNOSIS — Z8679 Personal history of other diseases of the circulatory system: Secondary | ICD-10-CM

## 2020-09-22 DIAGNOSIS — I724 Aneurysm of artery of lower extremity: Secondary | ICD-10-CM

## 2020-09-22 DIAGNOSIS — L7634 Postprocedural seroma of skin and subcutaneous tissue following other procedure: Secondary | ICD-10-CM

## 2020-09-22 DIAGNOSIS — M7989 Other specified soft tissue disorders: Secondary | ICD-10-CM

## 2020-09-22 DIAGNOSIS — I739 Peripheral vascular disease, unspecified: Secondary | ICD-10-CM

## 2020-09-22 DIAGNOSIS — I723 Aneurysm of iliac artery: Secondary | ICD-10-CM

## 2020-09-22 DIAGNOSIS — Z9889 Other specified postprocedural states: Secondary | ICD-10-CM

## 2020-09-22 NOTE — Progress Notes (Signed)
Patient is a 75 year old male who returns for follow-up today.  He recently underwent bifurcated aneurysm stent graft repair and coil embolization of the right internal iliac artery aneurysm.  This was complicated by requiring a right femoral endarterectomy and distal embolization in his right leg.  He underwent right femoral endarterectomy and then tibial embolectomy at the time of his procedure.  He had some post procedure hemorrhage but did not require return trip to the operating room.  This was mainly related to coagulopathy from heparinization.  Since that time he has had some drainage from his right groin.  At his first postoperative visit 1 week ago he was having to change his dressing almost continuously.  He is now only having to change it 4 times per day.  His below-knee incision is completely healed.  He has persistent edema in the right leg.  This has been present since exploration of a right groin hematoma after his coil embolization.  I discussed with him today that most likely this is related to lymphatic disruption.  Physical exam:  Vitals:   09/22/20 1018  BP: 130/82  Pulse: 81  Resp: 20  Temp: 98.3 F (36.8 C)  SpO2: 98%  Weight: 226 lb (102.5 kg)  Height: 6\' 4"  (1.93 m)    Extremities: Diffuse edema right leg extending from the knee all the way to the foot approximately 20% larger than the left leg, approximately 10 cc of amber serous fluid was evacuated from the right groin today.  There are 2 punctate holes in the incision where the drainage exits.  There is no surrounding erythema.  There is no purulent drainage.  His right below-knee incision is well-healed.  He does have some numbness and tingling over his anterior tibia.  Data: I reviewed his CT angiogram of the abdomen and pelvis from September 15, 2020.  This shows a 5 cm right groin seroma consistent with the drainage that he currently has.  The right common iliac and right internal iliac artery aneurysms are well  excluded with no endoleak.  The stent graft is in good position adjacent to the renal arteries and extending into the right external iliac and left common iliac arteries.  Common femoral arteries were patent bilaterally.  There was some enlargement of lymph nodes in the external iliac chain.  Assessment: Well excluded right common iliac and internal iliac artery aneurysm.  Persistent right lower extremity edema most likely secondary to lymphatic disruption from multiple previous groin incisions.  Patient was placed on 20 to 30 mm compression stocking knee-high today.  Discussed with the patient that hopefully this and the numbness area over his anterior tibia will continue to improve with time.  Occluded left popliteal aneurysm with short distance claudication.  Patient has a reasonable perfusion of his left foot and does not have limb threatening ischemia.  At some point we will consider above-knee to below-knee popliteal bypass but he will need to recover from all of his other incisions and previous procedures prior to this.  Plan: Patient will continue dry dressings in the right groin until the drainage has ceased.  Hopefully this will improve over the next few weeks.  He will return to see me in 4 to 6 weeks with repeat bilateral ABIs and consideration for above-knee to below-knee popliteal bypass.  He will need an aortoiliac ultrasound for follow-up of his iliac aneurysms in 6 months.  [name], MD [facility] [place] Office: [phone]

## 2020-09-28 ENCOUNTER — Other Ambulatory Visit: Payer: Self-pay

## 2020-09-28 DIAGNOSIS — I739 Peripheral vascular disease, unspecified: Secondary | ICD-10-CM

## 2020-09-29 ENCOUNTER — Encounter: Payer: Medicare HMO | Admitting: Vascular Surgery

## 2020-10-20 ENCOUNTER — Encounter: Payer: Self-pay | Admitting: Vascular Surgery

## 2020-10-20 ENCOUNTER — Other Ambulatory Visit: Payer: Self-pay

## 2020-10-20 ENCOUNTER — Ambulatory Visit: Payer: Medicare HMO | Admitting: Vascular Surgery

## 2020-10-20 ENCOUNTER — Ambulatory Visit (HOSPITAL_COMMUNITY)
Admission: RE | Admit: 2020-10-20 | Discharge: 2020-10-20 | Disposition: A | Discharge status: Home / Self Care | Payer: Medicare HMO | Source: Ambulatory Visit | Attending: Vascular Surgery | Admitting: Vascular Surgery

## 2020-10-20 VITALS — BP 126/84 | HR 73 | Temp 98.3°F | Resp 20 | Ht 76.0 in | Wt 227.0 lb

## 2020-10-20 DIAGNOSIS — I714 Abdominal aortic aneurysm, without rupture, unspecified: Secondary | ICD-10-CM

## 2020-10-20 DIAGNOSIS — I739 Peripheral vascular disease, unspecified: Secondary | ICD-10-CM | POA: Insufficient documentation

## 2020-10-20 DIAGNOSIS — I724 Aneurysm of artery of lower extremity: Secondary | ICD-10-CM

## 2020-10-20 DIAGNOSIS — I723 Aneurysm of iliac artery: Secondary | ICD-10-CM

## 2020-10-20 NOTE — Progress Notes (Signed)
Patient is a 75 year old male who returns for follow-up today.  He recently underwent bifurcated aneurysm stent graft repair of a 4.6 cm right common iliac aneurysm 3.8 cm infrarenal aneurysm and coil embolization of the 3.3 cm right internal iliac aneurysm as well as abdominal aortic aneurysm repair.  He also underwent right femoral endarterectomy and tibial embolectomy at the time of the procedure.  He had some difficulty healing up his right groin but this has ceased to drain and is now completely healed.  He still has chronic edema in his right lower extremity which has been present since his right femoral artery repair after his coil embolization.  He continues to wear compression garment on his right leg.  He has no swelling in the left leg.  His original complaint when we first met him October 2021 was for claudication in the left leg.  This was found to be secondary to a thrombosed popliteal aneurysm.  We discussed today whether or not to do a bypass on his left leg to improve his claudication symptoms.  He is still smoking and we discussed today that he would definitely benefit from quitting smoking to improve his life overall as well as increased durability of any repair we do for his popliteal aneurysm.  He is trying to quit.  He is on a statin.  He is also on Xarelto for paroxysmal atrial fibrillation.  Past Medical History:  Diagnosis Date  . Aortic stenosis    a. Mild by echo 04/2015.  . Arthritis    right hip  . PAF (paroxysmal atrial fibrillation) ([place])   . Peripheral arterial disease ([place])   . Tobacco abuse     Past Surgical History:  Procedure Laterality Date  . ABDOMINAL AORTIC ENDOVASCULAR STENT GRAFT N/A 08/29/2020   Procedure: ABDOMINAL AORTIC ENDOVASCULAR STENT GRAFT;  Surgeon: Elam Dutch, MD;  Location: [facility];  Service: Vascular;  Laterality: N/A;  . ABDOMINAL AORTOGRAM W/LOWER EXTREMITY Bilateral 06/24/2020   Procedure: ABDOMINAL AORTOGRAM W/LOWER EXTREMITY;   Surgeon: Elam Dutch, MD;  Location: [facility];  Service: Cardiovascular;  Laterality: Bilateral;  . BACK SURGERY     2005  . CERVICAL SPINE SURGERY    . EMBOLECTOMY Right 08/29/2020   Procedure: POPLITEAL AND TIBIAL EMBOLECTOMY;  Surgeon: Elam Dutch, MD;  Location: [facility];  Service: Vascular;  Laterality: Right;  . EMBOLIZATION Right 06/24/2020   Procedure: EMBOLIZATION;  Surgeon: Elam Dutch, MD;  Location: [facility];  Service: Cardiovascular;  Laterality: Right;  . ENDARTERECTOMY FEMORAL Right 08/29/2020   Procedure: ENDARTERECTOMY FEMORAL;  Surgeon: Elam Dutch, MD;  Location: [facility];  Service: Vascular;  Laterality: Right;  . EYE SURGERY    . FEMORAL ARTERY EXPLORATION Right 06/25/2020   Procedure: RIGHT GROIN EXPLORATION Evacuation of Hematoma  WITH REPAIR OF RIGHT Common FEMORAL ARTERY.;  Surgeon: Angelia Mould, MD;  Location: [facility];  Service: Vascular;  Laterality: Right;  . LEG SURGERY     Trauma  . PATCH ANGIOPLASTY Right 08/29/2020   Procedure: PATCH ANGIOPLASTY RIGHT BELOW KNEE POPLITEAL ARTERY AND RIGHT COMMON FEMORAL ARTERY;  Surgeon: Elam Dutch, MD;  Location: [place];  Service: Vascular;  Laterality: Right;  . RETINAL DETACHMENT SURGERY    . ULTRASOUND GUIDANCE FOR VASCULAR ACCESS Bilateral 08/29/2020   Procedure: ULTRASOUND GUIDANCE FOR VASCULAR ACCESS;  Surgeon: Elam Dutch, MD;  Location: [facility];  Service: Vascular;  Laterality: Bilateral;    Current Outpatient Medications on  File Prior to Visit  Medication Sig Dispense Refill  . acetaminophen (TYLENOL) 500 MG tablet Take 500 mg by mouth every 6 (six) hours as needed (for pain.).    Marland Kitchen cetirizine (ZYRTEC) 10 MG tablet Take 1 tablet (10 mg total) by mouth daily. (Patient taking differently: Take 10 mg by mouth daily as needed for allergies.) 30 tablet 11  . cholecalciferol (VITAMIN D) 25 MCG (1000 UNIT) tablet Take 1,000 Units by mouth 2 (two) times daily.    .  ciprofloxacin (CIPRO) 500 MG tablet Take 1 tablet (500 mg total) by mouth 2 (two) times daily. 20 tablet 0  . diltiazem (TIAZAC) 120 MG 24 hr capsule Take 1 capsule by mouth once daily (Patient taking differently: Take 120 mg by mouth daily.) 90 capsule 0  . fluticasone (FLONASE) 50 MCG/ACT nasal spray Place 2 sprays into both nostrils daily. (Patient taking differently: Place 2 sprays into both nostrils daily as needed for allergies.) 16 g 6  . metoprolol tartrate (LOPRESSOR) 25 MG tablet Take 1 tablet by mouth twice daily as needed (Patient taking differently: Take 25 mg by mouth 2 (two) times daily as needed (afib).) 30 tablet 0  . oxyCODONE-acetaminophen (PERCOCET/ROXICET) 5-325 MG tablet Take 1 tablet by mouth every 4 (four) hours as needed for moderate pain. 15 tablet 0  . rivaroxaban (XARELTO) 20 MG TABS tablet Take 1 tablet (20 mg total) by mouth daily with supper. 30 tablet 11  . rosuvastatin (CRESTOR) 20 MG tablet Take 1 tablet (20 mg total) by mouth every evening.     No current facility-administered medications on file prior to visit.   Physical exam:  Vitals:   10/20/20 1320  BP: 126/84  Pulse: 73  Resp: 20  Temp: 98.3 F (36.8 C)  SpO2: 92%  Weight: 227 lb (103 kg)  Height: 6' 4"  (1.93 m)    Extremities: Well-healed right groin incision both feet pink and warm nonpitting edema extending from the knee to the foot right leg  Data: Patient had bilateral ABIs performed today which were 0.63 on the left 1.22 on the right  Assessment: 1.  Aortic and iliac aneurysm now fully treated with stent graft repair and coil embolization healed right groin incision  2.  Thrombosed left popliteal aneurysm with claudication currently with reasonable ABI at 0.6  Plan: 1.  Patient needs follow-up aortic ultrasound in 1 year to reassess his stent graft  2.  Patient is considering whether or not he wants to have left leg bypass performed for his popliteal aneurysm.  He thinks he needs a few  more weeks to recover from the previous operations.  We will schedule him for a follow-up appointment in 4 to 6 weeks.  If at that time he wishes to proceed with popliteal aneurysm repair we will get a left leg vein mapping.  Patient was reassured today that he is not currently at risk of limb loss with an ABI of 0.6.  However, if he continues to smoke certainly his peripheral arterial disease could progress over time.  [name], MD [facility] [place] Office: [phone]

## 2020-11-09 ENCOUNTER — Other Ambulatory Visit: Payer: Self-pay | Admitting: Cardiology

## 2020-11-10 ENCOUNTER — Encounter: Payer: Self-pay | Admitting: Vascular Surgery

## 2020-11-10 ENCOUNTER — Ambulatory Visit (HOSPITAL_COMMUNITY)
Admission: RE | Admit: 2020-11-10 | Discharge: 2020-11-10 | Disposition: A | Discharge status: Home / Self Care | Payer: Medicare HMO | Source: Ambulatory Visit | Attending: Vascular Surgery | Admitting: Vascular Surgery

## 2020-11-10 ENCOUNTER — Other Ambulatory Visit: Payer: Self-pay

## 2020-11-10 ENCOUNTER — Ambulatory Visit: Payer: Medicare HMO | Admitting: Vascular Surgery

## 2020-11-10 VITALS — BP 129/82 | HR 76 | Temp 98.5°F | Resp 20 | Ht 76.0 in | Wt 226.0 lb

## 2020-11-10 DIAGNOSIS — I739 Peripheral vascular disease, unspecified: Secondary | ICD-10-CM | POA: Diagnosis not present

## 2020-11-10 NOTE — Progress Notes (Signed)
Patient is a 75 year old male who returns for follow-up today.  He was last seen October 20, 2020.  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  He also had coil embolization of a 3.3 cm right internal iliac aneurysm.  This was complicated by a hematoma in his right groin after his coil embolization.  At the time of his stent graft repair he also required right common femoral endarterectomy and popliteal and tibial embolectomy on the right side.  He has pretty much recovered from all of this at this point but still has some edema in his right leg which was present after the hematoma evacuation of his right leg.  The edema has improved significantly but his right leg is still approximate 25% larger than the left.  His original presenting symptom was claudication in the left leg.  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  He now feels like he is well enough to consider repair of this.  I reviewed his CT angiogram from November 2021 which shows occlusion of the popliteal artery and what appears to be two-vessel runoff.  However, this CT scan is quite dated and has had multiple interventions since then.  He does still have claudication in the left leg occurring at about 1 block.  He does not have rest pain.  Other chronic medical problems include aortic stenosis and paroxysmal atrial fibrillation.  He is on Xarelto for this.  Past Medical History:  Diagnosis Date  . Aortic stenosis    a. Mild by echo 04/2015.  . Arthritis    right hip  . PAF (paroxysmal atrial fibrillation) ([place])   . Peripheral arterial disease ([place])   . Tobacco abuse     Past Surgical History:  Procedure Laterality Date  . ABDOMINAL AORTIC ENDOVASCULAR STENT GRAFT N/A 08/29/2020   Procedure: ABDOMINAL AORTIC ENDOVASCULAR STENT GRAFT;  Surgeon: Elam Dutch, MD;  Location: [facility];  Service: Vascular;  Laterality: N/A;  .  ABDOMINAL AORTOGRAM W/LOWER EXTREMITY Bilateral 06/24/2020   Procedure: ABDOMINAL AORTOGRAM W/LOWER EXTREMITY;  Surgeon: Elam Dutch, MD;  Location: [facility];  Service: Cardiovascular;  Laterality: Bilateral;  . BACK SURGERY     2005  . CERVICAL SPINE SURGERY    . EMBOLECTOMY Right 08/29/2020   Procedure: POPLITEAL AND TIBIAL EMBOLECTOMY;  Surgeon: Elam Dutch, MD;  Location: [facility];  Service: Vascular;  Laterality: Right;  . EMBOLIZATION Right 06/24/2020   Procedure: EMBOLIZATION;  Surgeon: Elam Dutch, MD;  Location: [facility];  Service: Cardiovascular;  Laterality: Right;  . ENDARTERECTOMY FEMORAL Right 08/29/2020   Procedure: ENDARTERECTOMY FEMORAL;  Surgeon: Elam Dutch, MD;  Location: [facility] [place];  Service: Vascular;  Laterality: Right;  . EYE SURGERY    . FEMORAL ARTERY EXPLORATION Right 06/25/2020   Procedure: RIGHT GROIN EXPLORATION Evacuation of Hematoma  WITH REPAIR OF RIGHT Common FEMORAL ARTERY.;  Surgeon: Angelia Mould, MD;  Location: [facility];  Service: Vascular;  Laterality: Right;  . LEG SURGERY     Trauma  . PATCH ANGIOPLASTY Right 08/29/2020   Procedure: PATCH ANGIOPLASTY RIGHT BELOW KNEE POPLITEAL ARTERY AND RIGHT COMMON FEMORAL ARTERY;  Surgeon: Elam Dutch, MD;  Location: [facility];  Service: Vascular;  Laterality: Right;  . RETINAL DETACHMENT SURGERY    . ULTRASOUND GUIDANCE FOR VASCULAR ACCESS Bilateral 08/29/2020   Procedure: ULTRASOUND GUIDANCE FOR VASCULAR ACCESS;  Surgeon: Elam Dutch, MD;  [facility]: [facility] OR;  Service: Vascular;  Laterality: Bilateral;    Current Outpatient Medications on File Prior to Visit  Medication Sig Dispense Refill  . acetaminophen (TYLENOL) 500 MG tablet Take 500 mg by mouth every 6 (six) hours as needed (for pain.).    Marland Kitchen cetirizine (ZYRTEC) 10 MG tablet Take 1 tablet (10 mg total) by mouth daily. (Patient taking differently: Take 10 mg by mouth daily as needed for allergies.) 30 tablet 11  .  cholecalciferol (VITAMIN D) 25 MCG (1000 UNIT) tablet Take 1,000 Units by mouth 2 (two) times daily.    . ciprofloxacin (CIPRO) 500 MG tablet Take 1 tablet (500 mg total) by mouth 2 (two) times daily. 20 tablet 0  . diltiazem (TIAZAC) 120 MG 24 hr capsule Take 1 capsule by mouth once daily (Patient taking differently: Take 120 mg by mouth daily.) 90 capsule 0  . fluticasone (FLONASE) 50 MCG/ACT nasal spray Place 2 sprays into both nostrils daily. (Patient taking differently: Place 2 sprays into both nostrils daily as needed for allergies.) 16 g 6  . metoprolol tartrate (LOPRESSOR) 25 MG tablet Take 1 tablet by mouth twice daily as needed (Patient taking differently: Take 25 mg by mouth 2 (two) times daily as needed (afib).) 30 tablet 0  . oxyCODONE-acetaminophen (PERCOCET/ROXICET) 5-325 MG tablet Take 1 tablet by mouth every 4 (four) hours as needed for moderate pain. 15 tablet 0  . rivaroxaban (XARELTO) 20 MG TABS tablet Take 1 tablet (20 mg total) by mouth daily with supper. 30 tablet 11  . rosuvastatin (CRESTOR) 20 MG tablet Take 1 tablet (20 mg total) by mouth every evening.     No current facility-administered medications on file prior to visit.   Social History   Socioeconomic History  . Marital status: Married    Spouse name: Not on file  . Number of children: 5  . Years of education: Not on file  . Highest education level: Not on file  Occupational History  . Not on file  Tobacco Use  . Smoking status: Current Every Day Smoker    Packs/day: 0.50    Years: 50.00    Pack years: 25.00    Types: Cigarettes  . Smokeless tobacco: Never Used  Vaping Use  . Vaping Use: Never used  Substance and Sexual Activity  . Alcohol use: Yes    Alcohol/week: 0.0 standard drinks    Comment: 4 to 5 times a week; beer, wine and liquor   . Drug use: No  . Sexual activity: Not on file  Other Topics Concern  . Not on file  Social History Narrative   Scores reading tests.  Lives with wife.      Social Determinants of Health   Financial Resource Strain: Not on file  Food Insecurity: Not on file  Transportation Needs: Not on file  Physical Activity: Not on file  Stress: Not on file  Social Connections: Not on file  Intimate Partner Violence: Not on file    Review of systems: He gets shortness of breath with exertion.  He does not have chest pain.  Physical exam:  Vitals:   11/10/20 1513  BP: 129/82  Pulse: 76  Resp: 20  Temp: 98.5 F (36.9 C)  SpO2: 93%  Weight: 226 lb (102.5 kg)  Height: 6\' 4"  (1.93 m)    Extremities: Left foot is slightly more dusky than the right.  No palpable pedal pulses  Groin incision on the right  side is completely healed with no drainage left groin has no hematoma right leg is approximate 25% larger than the left extending from the thigh all the way to the foot  Abdomen: Soft nontender nondistended no pulsatile mass  Data: Patient had a vein mapping ultrasound today for operative planning purposes.  Patient had bilateral ABIs performed 17th 2022.  I reviewed this today.  Right side was 1.26 left side was 0.65  Patient had a vein mapping ultrasound today which shows a good quality saphenous vein in the thigh on both sides about 4 to 5 mm in diameter.  It does taper down in the mid calf to less than 3 mm both sides.    Assessment: Short distance claudication left leg lifestyle disabling  Plan: 1.  Patient will have a left lower extremity arteriogram for operative planning purposes by my partner Dr. Carlis Abbott on November 17, 2020.  We will avoid puncturing the right groin since he has had previous endarterectomy there and still has chronic swelling from his previous hematoma evacuation from his coil embolization peripheral procedure.  Previously he has not required bridging for his Xarelto.  We will stop this 3 days prior to his procedure.  Since his operation to repair his popliteal aneurysm is scheduled for the following Tuesday we will not  plan on restarting this.  He should be able to go home the same day after his diagnostic arteriogram.  He will be on bedrest for several hours post procedure.  2.  Patient will be scheduled for left above-knee to below-knee popliteal bypass on November 22, 2020.  Risk benefits possible complications of procedure details include not limited to bleeding infection graft thrombosis myocardial event all discussed with the patient today.  He understands and agrees to proceed.  Discussed with patient he will need hospitalization for probably 3 to 4 days postop with his bypass.    [name], MD [facility] [place] Office: [phone]

## 2020-11-10 NOTE — H&P (View-Only) (Signed)
Patient is a 75 year old male who returns for follow-up today.  He was last seen October 20, 2020.  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  He also had coil embolization of a 3.3 cm right internal iliac aneurysm.  This was complicated by a hematoma in his right groin after his coil embolization.  At the time of his stent graft repair he also required right common femoral endarterectomy and popliteal and tibial embolectomy on the right side.  He has pretty much recovered from all of this at this point but still has some edema in his right leg which was present after the hematoma evacuation of his right leg.  The edema has improved significantly but his right leg is still approximate 25% larger than the left.  His original presenting symptom was claudication in the left leg.  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  He now feels like he is well enough to consider repair of this.  I reviewed his CT angiogram from November 2021 which shows occlusion of the popliteal artery and what appears to be two-vessel runoff.  However, this CT scan is quite dated and has had multiple interventions since then.  He does still have claudication in the left leg occurring at about 1 block.  He does not have rest pain.  Other chronic medical problems include aortic stenosis and paroxysmal atrial fibrillation.  He is on Xarelto for this.  Past Medical History:  Diagnosis Date  . Aortic stenosis    a. Mild by echo 04/2015.  . Arthritis    right hip  . PAF (paroxysmal atrial fibrillation) ([place])   . Peripheral arterial disease ([place])   . Tobacco abuse     Past Surgical History:  Procedure Laterality Date  . ABDOMINAL AORTIC ENDOVASCULAR STENT GRAFT N/A 08/29/2020   Procedure: ABDOMINAL AORTIC ENDOVASCULAR STENT GRAFT;  Surgeon: Elam Dutch, MD;  Location: [facility];  Service: Vascular;  Laterality: N/A;  .  ABDOMINAL AORTOGRAM W/LOWER EXTREMITY Bilateral 06/24/2020   Procedure: ABDOMINAL AORTOGRAM W/LOWER EXTREMITY;  Surgeon: Elam Dutch, MD;  Location: [facility];  Service: Cardiovascular;  Laterality: Bilateral;  . BACK SURGERY     2005  . CERVICAL SPINE SURGERY    . EMBOLECTOMY Right 08/29/2020   Procedure: POPLITEAL AND TIBIAL EMBOLECTOMY;  Surgeon: Elam Dutch, MD;  Location: [facility];  Service: Vascular;  Laterality: Right;  . EMBOLIZATION Right 06/24/2020   Procedure: EMBOLIZATION;  Surgeon: Elam Dutch, MD;  Location: [facility];  Service: Cardiovascular;  Laterality: Right;  . ENDARTERECTOMY FEMORAL Right 08/29/2020   Procedure: ENDARTERECTOMY FEMORAL;  Surgeon: Elam Dutch, MD;  Location: [facility];  Service: Vascular;  Laterality: Right;  . EYE SURGERY    . FEMORAL ARTERY EXPLORATION Right 06/25/2020   Procedure: RIGHT GROIN EXPLORATION Evacuation of Hematoma  WITH REPAIR OF RIGHT Common FEMORAL ARTERY.;  Surgeon: Angelia Mould, MD;  Location: [facility];  Service: Vascular;  Laterality: Right;  . LEG SURGERY     Trauma  . PATCH ANGIOPLASTY Right 08/29/2020   Procedure: PATCH ANGIOPLASTY RIGHT BELOW KNEE POPLITEAL ARTERY AND RIGHT COMMON FEMORAL ARTERY;  Surgeon: Elam Dutch, MD;  Location: [facility];  Service: Vascular;  Laterality: Right;  . RETINAL DETACHMENT SURGERY    . ULTRASOUND GUIDANCE FOR VASCULAR ACCESS Bilateral 08/29/2020   Procedure: ULTRASOUND GUIDANCE FOR VASCULAR ACCESS;  Surgeon: Elam Dutch, MD;  Location: [facility] OR;  Service: Vascular;  Laterality: Bilateral;    Current Outpatient Medications on File Prior to Visit  Medication Sig Dispense Refill  . acetaminophen (TYLENOL) 500 MG tablet Take 500 mg by mouth every 6 (six) hours as needed (for pain.).    Marland Kitchen cetirizine (ZYRTEC) 10 MG tablet Take 1 tablet (10 mg total) by mouth daily. (Patient taking differently: Take 10 mg by mouth daily as needed for allergies.) 30 tablet 11  .  cholecalciferol (VITAMIN D) 25 MCG (1000 UNIT) tablet Take 1,000 Units by mouth 2 (two) times daily.    . ciprofloxacin (CIPRO) 500 MG tablet Take 1 tablet (500 mg total) by mouth 2 (two) times daily. 20 tablet 0  . diltiazem (TIAZAC) 120 MG 24 hr capsule Take 1 capsule by mouth once daily (Patient taking differently: Take 120 mg by mouth daily.) 90 capsule 0  . fluticasone (FLONASE) 50 MCG/ACT nasal spray Place 2 sprays into both nostrils daily. (Patient taking differently: Place 2 sprays into both nostrils daily as needed for allergies.) 16 g 6  . metoprolol tartrate (LOPRESSOR) 25 MG tablet Take 1 tablet by mouth twice daily as needed (Patient taking differently: Take 25 mg by mouth 2 (two) times daily as needed (afib).) 30 tablet 0  . oxyCODONE-acetaminophen (PERCOCET/ROXICET) 5-325 MG tablet Take 1 tablet by mouth every 4 (four) hours as needed for moderate pain. 15 tablet 0  . rivaroxaban (XARELTO) 20 MG TABS tablet Take 1 tablet (20 mg total) by mouth daily with supper. 30 tablet 11  . rosuvastatin (CRESTOR) 20 MG tablet Take 1 tablet (20 mg total) by mouth every evening.     No current facility-administered medications on file prior to visit.   Social History   Socioeconomic History  . Marital status: Married    Spouse name: Not on file  . Number of children: 5  . Years of education: Not on file  . Highest education level: Not on file  Occupational History  . Not on file  Tobacco Use  . Smoking status: Current Every Day Smoker    Packs/day: 0.50    Years: 50.00    Pack years: 25.00    Types: Cigarettes  . Smokeless tobacco: Never Used  Vaping Use  . Vaping Use: Never used  Substance and Sexual Activity  . Alcohol use: Yes    Alcohol/week: 0.0 standard drinks    Comment: 4 to 5 times a week; beer, wine and liquor   . Drug use: No  . Sexual activity: Not on file  Other Topics Concern  . Not on file  Social History Narrative   Scores reading tests.  Lives with wife.      Social Determinants of Health   Financial Resource Strain: Not on file  Food Insecurity: Not on file  Transportation Needs: Not on file  Physical Activity: Not on file  Stress: Not on file  Social Connections: Not on file  Intimate Partner Violence: Not on file    Review of systems: He gets shortness of breath with exertion.  He does not have chest pain.  Physical exam:  Vitals:   11/10/20 1513  BP: 129/82  Pulse: 76  Resp: 20  Temp: 98.5 F (36.9 C)  SpO2: 93%  Weight: 226 lb (102.5 kg)  Height: 6\' 4"  (1.93 m)    Extremities: Left foot is slightly more dusky than the right.  No palpable pedal pulses  Groin incision on the right  side is completely healed with no drainage left groin has no hematoma right leg is approximate 25% larger than the left extending from the thigh all the way to the foot  Abdomen: Soft nontender nondistended no pulsatile mass  Data: Patient had a vein mapping ultrasound today for operative planning purposes.  Patient had bilateral ABIs performed 17th 2022.  I reviewed this today.  Right side was 1.26 left side was 0.65  Patient had a vein mapping ultrasound today which shows a good quality saphenous vein in the thigh on both sides about 4 to 5 mm in diameter.  It does taper down in the mid calf to less than 3 mm both sides.    Assessment: Short distance claudication left leg lifestyle disabling  Plan: 1.  Patient will have a left lower extremity arteriogram for operative planning purposes by my partner Dr. Carlis Abbott on November 17, 2020.  We will avoid puncturing the right groin since he has had previous endarterectomy there and still has chronic swelling from his previous hematoma evacuation from his coil embolization peripheral procedure.  Previously he has not required bridging for his Xarelto.  We will stop this 3 days prior to his procedure.  Since his operation to repair his popliteal aneurysm is scheduled for the following Tuesday we will not  plan on restarting this.  He should be able to go home the same day after his diagnostic arteriogram.  He will be on bedrest for several hours post procedure.  2.  Patient will be scheduled for left above-knee to below-knee popliteal bypass on November 22, 2020.  Risk benefits possible complications of procedure details include not limited to bleeding infection graft thrombosis myocardial event all discussed with the patient today.  He understands and agrees to proceed.  Discussed with patient he will need hospitalization for probably 3 to 4 days postop with his bypass.    [name], MD [facility] Office: [phone]

## 2020-11-15 ENCOUNTER — Other Ambulatory Visit (HOSPITAL_COMMUNITY)
Admission: RE | Admit: 2020-11-15 | Discharge: 2020-11-15 | Disposition: A | Discharge status: Home / Self Care | Payer: Medicare HMO | Source: Ambulatory Visit | Attending: Vascular Surgery | Admitting: Vascular Surgery

## 2020-11-15 DIAGNOSIS — Z20822 Contact with and (suspected) exposure to covid-19: Secondary | ICD-10-CM | POA: Diagnosis not present

## 2020-11-15 DIAGNOSIS — Z01812 Encounter for preprocedural laboratory examination: Secondary | ICD-10-CM | POA: Diagnosis not present

## 2020-11-15 LAB — SARS CORONAVIRUS 2 (TAT 6-24 HRS): SARS Coronavirus 2: NEGATIVE

## 2020-11-17 ENCOUNTER — Ambulatory Visit: Payer: Medicare HMO | Admitting: Vascular Surgery

## 2020-11-17 ENCOUNTER — Encounter (HOSPITAL_COMMUNITY)
Admission: RE | Disposition: A | Discharge status: Home / Self Care | Payer: Self-pay | Source: Ambulatory Visit | Attending: Vascular Surgery

## 2020-11-17 ENCOUNTER — Encounter (HOSPITAL_COMMUNITY): Payer: Self-pay | Admitting: Vascular Surgery

## 2020-11-17 ENCOUNTER — Ambulatory Visit (HOSPITAL_COMMUNITY)
Admission: RE | Admit: 2020-11-17 | Discharge: 2020-11-17 | Disposition: A | Discharge status: Home / Self Care | Payer: Medicare HMO | Source: Ambulatory Visit | Attending: Vascular Surgery | Admitting: Vascular Surgery

## 2020-11-17 ENCOUNTER — Other Ambulatory Visit: Payer: Self-pay

## 2020-11-17 DIAGNOSIS — I350 Nonrheumatic aortic (valve) stenosis: Secondary | ICD-10-CM | POA: Diagnosis not present

## 2020-11-17 DIAGNOSIS — I480 Paroxysmal atrial fibrillation: Secondary | ICD-10-CM | POA: Diagnosis not present

## 2020-11-17 DIAGNOSIS — Z7901 Long term (current) use of anticoagulants: Secondary | ICD-10-CM | POA: Insufficient documentation

## 2020-11-17 DIAGNOSIS — Z79899 Other long term (current) drug therapy: Secondary | ICD-10-CM | POA: Insufficient documentation

## 2020-11-17 DIAGNOSIS — I724 Aneurysm of artery of lower extremity: Secondary | ICD-10-CM | POA: Insufficient documentation

## 2020-11-17 DIAGNOSIS — F17210 Nicotine dependence, cigarettes, uncomplicated: Secondary | ICD-10-CM | POA: Diagnosis not present

## 2020-11-17 DIAGNOSIS — I70212 Atherosclerosis of native arteries of extremities with intermittent claudication, left leg: Secondary | ICD-10-CM | POA: Insufficient documentation

## 2020-11-17 DIAGNOSIS — T82868A Thrombosis of vascular prosthetic devices, implants and grafts, initial encounter: Secondary | ICD-10-CM | POA: Diagnosis not present

## 2020-11-17 HISTORY — PX: LOWER EXTREMITY ANGIOGRAPHY: CATH118251

## 2020-11-17 LAB — POCT I-STAT, CHEM 8
BUN: 16 mg/dL (ref 8–23)
Calcium, Ion: 1.21 mmol/L (ref 1.15–1.40)
Chloride: 104 mmol/L (ref 98–111)
Creatinine, Ser: 0.8 mg/dL (ref 0.61–1.24)
Glucose, Bld: 108 mg/dL — ABNORMAL HIGH (ref 70–99)
HCT: 44 % (ref 39.0–52.0)
Hemoglobin: 15 g/dL (ref 13.0–17.0)
Potassium: 4.2 mmol/L (ref 3.5–5.1)
Sodium: 140 mmol/L (ref 135–145)
TCO2: 24 mmol/L (ref 22–32)

## 2020-11-17 SURGERY — LOWER EXTREMITY ANGIOGRAPHY
Anesthesia: LOCAL

## 2020-11-17 MED ORDER — LABETALOL HCL 5 MG/ML IV SOLN: 10.0000 mg | INTRAVENOUS | Status: DC | PRN

## 2020-11-17 MED ORDER — FENTANYL CITRATE (PF) 100 MCG/2ML IJ SOLN
INTRAMUSCULAR | Status: DC | PRN
  Administered 2020-11-17: 50 ug via INTRAVENOUS

## 2020-11-17 MED ORDER — SODIUM CHLORIDE 0.9 % IV SOLN: INTRAVENOUS | Status: DC

## 2020-11-17 MED ORDER — FENTANYL CITRATE (PF) 100 MCG/2ML IJ SOLN
INTRAMUSCULAR | Status: AC
  Filled 2020-11-17: qty 2

## 2020-11-17 MED ORDER — SODIUM CHLORIDE 0.9% FLUSH: 3.0000 mL | Freq: Two times a day (BID) | INTRAVENOUS | Status: DC

## 2020-11-17 MED ORDER — HEPARIN (PORCINE) IN NACL 1000-0.9 UT/500ML-% IV SOLN
INTRAVENOUS | Status: DC | PRN
  Administered 2020-11-17: 500 mL

## 2020-11-17 MED ORDER — ACETAMINOPHEN 325 MG PO TABS: 650.0000 mg | ORAL_TABLET | ORAL | Status: DC | PRN

## 2020-11-17 MED ORDER — SODIUM CHLORIDE 0.9 % IV SOLN: 250.0000 mL | INTRAVENOUS | Status: DC | PRN

## 2020-11-17 MED ORDER — MIDAZOLAM HCL 2 MG/2ML IJ SOLN
INTRAMUSCULAR | Status: DC | PRN
  Administered 2020-11-17: 1 mg via INTRAVENOUS

## 2020-11-17 MED ORDER — HYDRALAZINE HCL 20 MG/ML IJ SOLN: 5.0000 mg | INTRAMUSCULAR | Status: DC | PRN

## 2020-11-17 MED ORDER — HEPARIN (PORCINE) IN NACL 1000-0.9 UT/500ML-% IV SOLN
INTRAVENOUS | Status: AC
  Filled 2020-11-17: qty 1000

## 2020-11-17 MED ORDER — SODIUM CHLORIDE 0.9% FLUSH: 3.0000 mL | INTRAVENOUS | Status: DC | PRN

## 2020-11-17 MED ORDER — LIDOCAINE HCL (PF) 1 % IJ SOLN
INTRAMUSCULAR | Status: DC | PRN
  Administered 2020-11-17: 15 mL

## 2020-11-17 MED ORDER — ONDANSETRON HCL 4 MG/2ML IJ SOLN: 4.0000 mg | Freq: Four times a day (QID) | INTRAMUSCULAR | Status: DC | PRN

## 2020-11-17 MED ORDER — LIDOCAINE HCL (PF) 1 % IJ SOLN
INTRAMUSCULAR | Status: AC
  Filled 2020-11-17: qty 30

## 2020-11-17 MED ORDER — MIDAZOLAM HCL 2 MG/2ML IJ SOLN
INTRAMUSCULAR | Status: AC
  Filled 2020-11-17: qty 2

## 2020-11-17 SURGICAL SUPPLY — 10 items
CATH OMNI FLUSH 5F 65CM (CATHETERS) ×3 IMPLANT
DEVICE CLOSURE MYNXGRIP 5F (Vascular Products) ×3 IMPLANT
KIT MICROPUNCTURE NIT STIFF (SHEATH) ×6 IMPLANT
KIT PV (KITS) ×3 IMPLANT
SHEATH PINNACLE 5F 10CM (SHEATH) ×3 IMPLANT
SHEATH PROBE COVER 6X72 (BAG) ×3 IMPLANT
SYR MEDRAD MARK V 150ML (SYRINGE) ×3 IMPLANT
TRANSDUCER W/STOPCOCK (MISCELLANEOUS) ×3 IMPLANT
TRAY PV CATH (CUSTOM PROCEDURE TRAY) ×3 IMPLANT
WIRE BENTSON .035X145CM (WIRE) ×3 IMPLANT

## 2020-11-17 NOTE — H&P (Signed)
History and Physical Interval Note:  11/17/2020 11:30 AM  Cory [name]  has presented today for surgery, with the diagnosis of PAD w/claudication.  The various methods of treatment have been discussed with the patient and family. After consideration of risks, benefits and other options for treatment, the patient has consented to  Procedure(s): ABDOMINAL AORTOGRAM W/LOWER EXTREMITY (N/A) as a surgical intervention.  The patient's history has been reviewed, patient examined, no change in status, stable for surgery.  I have reviewed the patient's chart and labs.  Questions were answered to the patient's satisfaction.    Left lower extremity arteriogram - diagnostic.    Cory [name]  Patient is a 75 year old male who returns for follow-up today.  He was last seen October 20, 2020.  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  He also had coil embolization of a 3.3 cm right internal iliac aneurysm.  This was complicated by a hematoma in his right groin after his coil embolization.  At the time of his stent graft repair he also required right common femoral endarterectomy and popliteal and tibial embolectomy on the right side.  He has pretty much recovered from all of this at this point but still has some edema in his right leg which was present after the hematoma evacuation of his right leg.  The edema has improved significantly but his right leg is still approximate 25% larger than the left.  His original presenting symptom was claudication in the left leg.  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  He now feels like he is well enough to consider repair of this.  I reviewed his CT angiogram from November 2021 which shows occlusion of the popliteal artery and what appears to be two-vessel runoff.  However, this CT scan is quite dated and has had multiple interventions since then.  He does  still have claudication in the left leg occurring at about 1 block.  He does not have rest pain.  Other chronic medical problems include aortic stenosis and paroxysmal atrial fibrillation.  He is on Xarelto for this.      Past Medical History:  Diagnosis Date  . Aortic stenosis    a. Mild by echo 04/2015.  . Arthritis    right hip  . PAF (paroxysmal atrial fibrillation) ([place])   . Peripheral arterial disease ([place])   . Tobacco abuse          Past Surgical History:  Procedure Laterality Date  . ABDOMINAL AORTIC ENDOVASCULAR STENT GRAFT N/A 08/29/2020   Procedure: ABDOMINAL AORTIC ENDOVASCULAR STENT GRAFT;  Surgeon: Elam Dutch, MD;  Location: [facility];  Service: Vascular;  Laterality: N/A;  . ABDOMINAL AORTOGRAM W/LOWER EXTREMITY Bilateral 06/24/2020   Procedure: ABDOMINAL AORTOGRAM W/LOWER EXTREMITY;  Surgeon: Elam Dutch, MD;  Location: [facility];  Service: Cardiovascular;  Laterality: Bilateral;  . BACK SURGERY     2005  . CERVICAL SPINE SURGERY    . EMBOLECTOMY Right 08/29/2020   Procedure: POPLITEAL AND TIBIAL EMBOLECTOMY;  Surgeon: Elam Dutch, MD;  Location: [facility];  Service: Vascular;  Laterality: Right;  . EMBOLIZATION Right 06/24/2020   Procedure: EMBOLIZATION;  Surgeon: Elam Dutch, MD;  Location: [facility];  Service: Cardiovascular;  Laterality: Right;  . ENDARTERECTOMY FEMORAL Right 08/29/2020   Procedure: ENDARTERECTOMY FEMORAL;  Surgeon: Elam Dutch, MD;  Location: [facility]  OR;  Service: Vascular;  Laterality: Right;  . EYE SURGERY    . FEMORAL ARTERY EXPLORATION Right 06/25/2020   Procedure: RIGHT GROIN EXPLORATION Evacuation of Hematoma  WITH REPAIR OF RIGHT Common FEMORAL ARTERY.;  Surgeon: Angelia Mould, MD;  Location: [facility];  Service: Vascular;  Laterality: Right;  . LEG SURGERY     Trauma  . PATCH ANGIOPLASTY Right 08/29/2020   Procedure: PATCH ANGIOPLASTY RIGHT BELOW KNEE POPLITEAL ARTERY  AND RIGHT COMMON FEMORAL ARTERY;  Surgeon: Elam Dutch, MD;  Location: [facility];  Service: Vascular;  Laterality: Right;  . RETINAL DETACHMENT SURGERY    . ULTRASOUND GUIDANCE FOR VASCULAR ACCESS Bilateral 08/29/2020   Procedure: ULTRASOUND GUIDANCE FOR VASCULAR ACCESS;  Surgeon: Elam Dutch, MD;  Location: [facility] OR;  Service: Vascular;  Laterality: Bilateral;          Current Outpatient Medications on File Prior to Visit  Medication Sig Dispense Refill  . acetaminophen (TYLENOL) 500 MG tablet Take 500 mg by mouth every 6 (six) hours as needed (for pain.).    Marland Kitchen cetirizine (ZYRTEC) 10 MG tablet Take 1 tablet (10 mg total) by mouth daily. (Patient taking differently: Take 10 mg by mouth daily as needed for allergies.) 30 tablet 11  . cholecalciferol (VITAMIN D) 25 MCG (1000 UNIT) tablet Take 1,000 Units by mouth 2 (two) times daily.    . ciprofloxacin (CIPRO) 500 MG tablet Take 1 tablet (500 mg total) by mouth 2 (two) times daily. 20 tablet 0  . diltiazem (TIAZAC) 120 MG 24 hr capsule Take 1 capsule by mouth once daily (Patient taking differently: Take 120 mg by mouth daily.) 90 capsule 0  . fluticasone (FLONASE) 50 MCG/ACT nasal spray Place 2 sprays into both nostrils daily. (Patient taking differently: Place 2 sprays into both nostrils daily as needed for allergies.) 16 g 6  . metoprolol tartrate (LOPRESSOR) 25 MG tablet Take 1 tablet by mouth twice daily as needed (Patient taking differently: Take 25 mg by mouth 2 (two) times daily as needed (afib).) 30 tablet 0  . oxyCODONE-acetaminophen (PERCOCET/ROXICET) 5-325 MG tablet Take 1 tablet by mouth every 4 (four) hours as needed for moderate pain. 15 tablet 0  . rivaroxaban (XARELTO) 20 MG TABS tablet Take 1 tablet (20 mg total) by mouth daily with supper. 30 tablet 11  . rosuvastatin (CRESTOR) 20 MG tablet Take 1 tablet (20 mg total) by mouth every evening.     No current facility-administered medications on file prior to visit.    Social History        Socioeconomic History  . Marital status: Married    Spouse name: Not on file  . Number of children: 5  . Years of education: Not on file  . Highest education level: Not on file  Occupational History  . Not on file  Tobacco Use  . Smoking status: Current Every Day Smoker    Packs/day: 0.50    Years: 50.00    Pack years: 25.00    Types: Cigarettes  . Smokeless tobacco: Never Used  Vaping Use  . Vaping Use: Never used  Substance and Sexual Activity  . Alcohol use: Yes    Alcohol/week: 0.0 standard drinks    Comment: 4 to 5 times a week; beer, wine and liquor   . Drug use: No  . Sexual activity: Not on file  Other Topics Concern  . Not on file  Social History Narrative   Scores reading tests.  Lives with wife.  Social Determinants of Health   Financial Resource Strain: Not on file  Food Insecurity: Not on file  Transportation Needs: Not on file  Physical Activity: Not on file  Stress: Not on file  Social Connections: Not on file  Intimate Partner Violence: Not on file    Review of systems: He gets shortness of breath with exertion.  He does not have chest pain.  Physical exam:     Vitals:   11/10/20 1513  BP: 129/82  Pulse: 76  Resp: 20  Temp: 98.5 F (36.9 C)  SpO2: 93%  Weight: 226 lb (102.5 kg)  Height: 6\' 4"  (1.93 m)    Extremities: Left foot is slightly more dusky than the right.  No palpable pedal pulses  Groin incision on the right side is completely healed with no drainage left groin has no hematoma right leg is approximate 25% larger than the left extending from the thigh all the way to the foot  Abdomen: Soft nontender nondistended no pulsatile mass  Data: Patient had a vein mapping ultrasound today for operative planning purposes.  Patient had bilateral ABIs performed 17th 2022.  I reviewed this today.  Right side was 1.26 left side was 0.65  Patient had a vein mapping ultrasound  today which shows a good quality saphenous vein in the thigh on both sides about 4 to 5 mm in diameter.  It does taper down in the mid calf to less than 3 mm both sides.    Assessment: Short distance claudication left leg lifestyle disabling  Plan: 1.  Patient will have a left lower extremity arteriogram for operative planning purposes by my partner Dr. Carlis Abbott on November 17, 2020.  We will avoid puncturing the right groin since he has had previous endarterectomy there and still has chronic swelling from his previous hematoma evacuation from his coil embolization peripheral procedure.  Previously he has not required bridging for his Xarelto.  We will stop this 3 days prior to his procedure.  Since his operation to repair his popliteal aneurysm is scheduled for the following Tuesday we will not plan on restarting this.  He should be able to go home the same day after his diagnostic arteriogram.  He will be on bedrest for several hours post procedure.  2.  Patient will be scheduled for left above-knee to below-knee popliteal bypass on November 22, 2020.  Risk benefits possible complications of procedure details include not limited to bleeding infection graft thrombosis myocardial event all discussed with the patient today.  He understands and agrees to proceed.  Discussed with patient he will need hospitalization for probably 3 to 4 days postop with his bypass.    [name], MD [facility] Office: [phone]

## 2020-11-17 NOTE — Discharge Instructions (Signed)
Continue to hold Xarelto after your procedure today given bypass surgery next Tuesday with Dr. Oneida Alar.  Angiogram, Care After This sheet gives you information about how to care for yourself after your procedure. Your health care provider may also give you more specific instructions. If you have problems or questions, contact your health care provider. What can I expect after the procedure? After the procedure, it is common to have:  Bruising and tenderness at the catheter insertion area.  A collection of blood (hematoma) at the insertion area. This may feel like a small lump under the skin at the insertion site. Follow these instructions at home: Insertion site care  Follow instructions from your health care provider about how to take care of your insertion site. Make sure you: ? Wash your hands with soap and water before and after you change your bandage (dressing). If soap and water are not available, use hand sanitizer. ? Change your dressing as told by your health care provider.  Do not take baths, swim, or use a hot tub until your health care provider approves.  You may shower 24-48 hours after the procedure, or as told by your health care provider. To clean the insertion site: ? Gently wash the area with plain soap and water. ? Pat the area dry with a clean towel. ? Do not rub the site. This may cause bleeding.  Check your insertion site every day for signs of infection. Check for: ? Redness, swelling, or pain. ? Fluid or blood. ? Warmth. ? Pus or a bad smell.  Do not apply powder or lotion to the site. Keep the site clean and dry.   Activity  Do not drive for 24 hours if you were given a sedative during your procedure.  Rest as told by your health care provider, usually for 1-2 days.  Do not lift anything that is heavier than 10 lb (4.5 kg), or the limit that you are told, until your health care provider says that it is safe.  If the insertion site was in your leg, try  to avoid stairs for a few days.  Return to your normal activities as told by your health care provider, usually in about a week. Ask your health care provider what activities are safe for you. General instructions  If your insertion site starts bleeding, lie flat and put pressure on the site. If the bleeding does not stop, get help right away. This is a medical emergency.  Take over-the-counter and prescription medicines only as told by your health care provider.  Drink enough fluid to keep your urine pale yellow. This helps flush the contrast dye from your body.  Keep all follow-up visits as told by your health care provider. This is important.   Contact a health care provider if:  You have a fever or chills.  You have redness, swelling, or pain around your insertion site.  You have fluid or blood coming from your insertion site.  Your insertion site feels warm to the touch.  You have pus or a bad smell coming from your insertion site.  You have more bruising around the insertion site. Get help right away if you have:  A problem with the insertion area, such as: ? The area swells fast or bleeds even after you apply pressure. ? The area becomes pale, cool, tingly, or numb.  Chest pain.  Trouble breathing.  A rash.  Any symptoms of a stroke. "BE FAST" is an easy way to remember  the main warning signs of a stroke: ? B - Balance. Signs are dizziness, sudden trouble walking, or loss of balance. ? E - Eyes. Signs are trouble seeing or a sudden change in vision. ? F - Face. Signs are sudden weakness or loss of feeling of the face, or the face or eyelid drooping on one side. ? A - Arms. Signs are weakness or loss of feeling in an arm. This happens suddenly and usually on one side of the body. ? S - Speech. Signs are sudden trouble speaking, slurred speech, or trouble understanding what people say. ? T - Time. Time to call emergency services. Write down what time symptoms  started.  You have other signs of a stroke, such as: ? A sudden, severe headache with no known cause. ? Nausea or vomiting. ? Seizure. These symptoms may represent a serious problem that is an emergency. Do not wait to see if the symptoms will go away. Get medical help right away. Call your local emergency services (911 in the [place] Do not drive yourself to the hospital. Summary  It is common to have bruising and tenderness at the catheter insertion area.  Do not take baths, swim, or use a hot tub until your health care provider approves. You may shower 24-48 hours after the procedure or as told.  It is important to rest and drink plenty of fluids.  If the insertion site bleeds, lie flat and put pressure on the site. If the bleeding continues, get help right away. This is a medical emergency. This information is not intended to replace advice given to you by your health care provider. Make sure you discuss any questions you have with your health care provider. Document Revised: 05/27/2019 Document Reviewed: 05/27/2019 Elsevier Patient Education  [place].

## 2020-11-17 NOTE — Op Note (Signed)
    Patient name: Cory [name] MRN: [id number] DOB: 1945/09/15 Sex: male  11/17/2020 Pre-operative Diagnosis: Occluded left popliteal artery aneurysm Post-operative diagnosis:  Same Surgeon:  Marty Heck, MD Procedure Performed: 1.  Ultrasound guided access of left common femoral artery 2.  Left lower extremity arteriogram with runoff 3.  Mynx closure of the left common femoral artery 4.  20 minutes of monitored moderate conscious sedation time  Indications: 75 year old male who was been under the care of Dr. 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  Ultimately he has claudication symptoms in the left leg with a known popliteal aneurysm occlusion.  He presents today for left lower extremity arteriogram nad is scheduled for a popliteal bypass next week with Dr. Oneida Alar.  Risk benefits discussed.  Findings:   Ultrasound-guided access of left common femoral artery.  Runoff showed a patent common femoral, profunda, and SFA.  The SFA occludes at Hunter's canal and the above-knee popliteal and behind the knee popliteal artery are occluded.  He does reconstitute TP trunk with two-vessel runoff in the posterior tibial and peroneal artery.  Anterior tibial appears disased shortly after takeoff and eventually occludes.  There is some retrograde flow into the below-knee popliteal artery that looks very diseaed.   Procedure:  The patient was identified in the holding area and taken to room 8.  The patient was then placed supine on the table and prepped and draped in the usual sterile fashion.  A time out was called.  Ultrasound was used to evaluate the left common femoral artery.  It was patent .  A digital ultrasound image was acquired.  A micropuncture needle was used to access the left common femoral artery under ultrasound guidance.  An 018 wire was advanced without resistance and a micropuncture sheath was placed.   The 018 wire was removed and a benson wire was placed.  The micropuncture sheath was exchanged for a 5 french sheath.  Ultimately we used a 5 French sheath in the left common femoral artery to obtain left lower extremity runoff.  Pertinent findings are noted above but does appear to have occlusion of his above-knee popliteal artery and behind the knee popliteal artery with reconstitution of a TP trunk and two-vessel runoff in the peroneal and posterior tibial.  There is some retrograde flow in a diseased below-knee popliteal artery.  That point time wires and catheters were removed.  A mynx closure device was deployed in the left common femoral artery.  Taken to holding in stable condition to have his groin observed.    Plan: Patient is scheduled for left popliteal bypass next Tuesday with Dr. Oneida Alar for occluded left popliteal artery aneurysm.     Marty Heck, MD [facility] Office: [phone]

## 2020-11-18 ENCOUNTER — Encounter (HOSPITAL_COMMUNITY): Payer: Self-pay | Admitting: Vascular Surgery

## 2020-11-21 ENCOUNTER — Other Ambulatory Visit (HOSPITAL_COMMUNITY)
Admission: RE | Admit: 2020-11-21 | Discharge: 2020-11-21 | Disposition: A | Discharge status: Home / Self Care | Payer: Medicare HMO | Source: Ambulatory Visit | Attending: Vascular Surgery | Admitting: Vascular Surgery

## 2020-11-21 ENCOUNTER — Other Ambulatory Visit: Payer: Self-pay

## 2020-11-21 ENCOUNTER — Encounter (HOSPITAL_COMMUNITY): Payer: Self-pay | Admitting: Vascular Surgery

## 2020-11-21 DIAGNOSIS — I714 Abdominal aortic aneurysm, without rupture: Secondary | ICD-10-CM | POA: Diagnosis not present

## 2020-11-21 DIAGNOSIS — I480 Paroxysmal atrial fibrillation: Secondary | ICD-10-CM | POA: Diagnosis present

## 2020-11-21 DIAGNOSIS — F17210 Nicotine dependence, cigarettes, uncomplicated: Secondary | ICD-10-CM | POA: Diagnosis present

## 2020-11-21 DIAGNOSIS — I724 Aneurysm of artery of lower extremity: Secondary | ICD-10-CM | POA: Diagnosis not present

## 2020-11-21 DIAGNOSIS — I739 Peripheral vascular disease, unspecified: Secondary | ICD-10-CM | POA: Diagnosis not present

## 2020-11-21 DIAGNOSIS — I10 Essential (primary) hypertension: Secondary | ICD-10-CM | POA: Diagnosis not present

## 2020-11-21 DIAGNOSIS — Z01812 Encounter for preprocedural laboratory examination: Secondary | ICD-10-CM | POA: Insufficient documentation

## 2020-11-21 DIAGNOSIS — I70202 Unspecified atherosclerosis of native arteries of extremities, left leg: Secondary | ICD-10-CM | POA: Diagnosis not present

## 2020-11-21 DIAGNOSIS — I7092 Chronic total occlusion of artery of the extremities: Secondary | ICD-10-CM | POA: Diagnosis not present

## 2020-11-21 DIAGNOSIS — I70212 Atherosclerosis of native arteries of extremities with intermittent claudication, left leg: Secondary | ICD-10-CM | POA: Diagnosis present

## 2020-11-21 DIAGNOSIS — Z20822 Contact with and (suspected) exposure to covid-19: Secondary | ICD-10-CM | POA: Diagnosis present

## 2020-11-21 DIAGNOSIS — M1611 Unilateral primary osteoarthritis, right hip: Secondary | ICD-10-CM | POA: Diagnosis present

## 2020-11-21 DIAGNOSIS — T82868A Thrombosis of vascular prosthetic devices, implants and grafts, initial encounter: Secondary | ICD-10-CM | POA: Diagnosis not present

## 2020-11-21 DIAGNOSIS — Z79899 Other long term (current) drug therapy: Secondary | ICD-10-CM | POA: Diagnosis not present

## 2020-11-21 DIAGNOSIS — I350 Nonrheumatic aortic (valve) stenosis: Secondary | ICD-10-CM | POA: Diagnosis present

## 2020-11-21 DIAGNOSIS — Z7901 Long term (current) use of anticoagulants: Secondary | ICD-10-CM | POA: Diagnosis not present

## 2020-11-21 NOTE — Progress Notes (Signed)
PCP - Dr Carolann Littler Cardiologist - Dr [name]  Chest x-ray - 08/29/20 (1V) EKG - 05/13/20 Stress Test - 02/15/15 ECHO - 05/31/20 Cardiac Cath - n/a  Blood Thinner Instructions:  Last dose of xarelto was on 11/15/19.   Anesthesia review: Yes  STOP now taking any Aspirin (unless otherwise instructed by your surgeon), Aleve, Naproxen, Ibuprofen, Motrin, Advil, Goody's, BC's, all herbal medications, fish oil, and all vitamins.   Coronavirus Screening Covid test is scheduled for 11/21/20 Do you have any of the following symptoms:  Cough yes/no: No Fever (>100.61F)  yes/no: No Runny nose yes/no: No Sore throat yes/no: No Difficulty breathing/shortness of breath  yes/no: No  Have you traveled in the last 14 days and where? yes/no: No  Patient verbalized understanding of instructions that were given via phone.

## 2020-11-21 NOTE — Anesthesia Preprocedure Evaluation (Addendum)
Anesthesia Evaluation  Patient identified by MRN, date of birth, ID band Patient awake    Reviewed: Allergy & Precautions, NPO status , Patient's Chart, lab work & pertinent test results  Airway Mallampati: II  TM Distance: >3 FB     Dental   Pulmonary Current Smoker and Patient abstained from smoking.,    breath sounds clear to auscultation       Cardiovascular hypertension, + Peripheral Vascular Disease  + dysrhythmias  Rhythm:Regular Rate:Normal     Neuro/Psych  Neuromuscular disease negative neurological ROS     GI/Hepatic negative GI ROS, Neg liver ROS,   Endo/Other    Renal/GU negative Renal ROS     Musculoskeletal  (+) Arthritis ,   Abdominal   Peds  Hematology   Anesthesia Other Findings   Reproductive/Obstetrics                            Anesthesia Physical Anesthesia Plan  ASA: III  Anesthesia Plan: General   Post-op Pain Management:    Induction: Intravenous  PONV Risk Score and Plan: 2 and Ondansetron, Dexamethasone and Midazolam  Airway Management Planned: Oral ETT  Additional Equipment:   Intra-op Plan:   Post-operative Plan: Possible Post-op intubation/ventilation  Informed Consent: I have reviewed the patients History and Physical, chart, labs and discussed the procedure including the risks, benefits and alternatives for the proposed anesthesia with the patient or authorized representative who has indicated his/her understanding and acceptance.     Dental advisory given  Plan Discussed with: CRNA and Anesthesiologist  Anesthesia Plan Comments: (PAT note by Karoline Caldwell, PA-C: Follows with cardiology for history of paroxysmal A. fib and moderate aortic stenosis.  He is maintained on Xarelto.  He had a negative stress test 02/15/2015.  Most recent echo October 2021 showed EF 60 to 65%, grade 1 DD, moderate AS (mean gradient 22.3 mmHg), moderate dilation of the  ascending aorta (44 mm).  Previously cleared by cardiology in November 2021 to undergo vascular surgery. Per telephone encounter 06/10/2020, "Chart reviewed as part of pre-operative protocol coverage. Patient was last seen by Dr. Percival Spanish on 05/13/2020 at which time he was doing well. Patient was contacted on 06/09/2020 for further pre-op evaluation. He was doing well at that time from a cardiac standpoint. He denied any exertional chest pain or shortness of breath and was able to complete >4.0 METS. Dr. Percival Spanish did not feel like any additional ischemic evaluation was needed piror to procedure. Spoke with [name] in the cath lab who stated Xarelto is usually held for 48 hours prior to procedure. Per Dr. Percival Spanish, [place] to hold Xarelto. This should be restarted as soon as able following procedure."  Patient underwent abdominal aortic stent graft with right common femoral endarterectomy with bovine patch angioplasty 08/29/2020.  Patient will need day of surgery labs and evaluation.  EKG 05/13/2020: NSR.  Rate 65.  LAFB.  CTA abdomen pelvis 09/15/2020: IMPRESSION: VASCULAR  Status post AAA repair and right iliac aneurysm repair with a bifurcated endograft. Aneurysms appear excluded. Negative for endoleak.  No other acute intra-abdominal or pelvic vascular finding.  NON-VASCULAR  Postoperative right inguinal superficial 5.2 cm fluid collection over the femoral vessels may represent seroma, lymphocele, less likely hematoma or abscess.  No other acute intra-abdominopelvic finding  PORTABLE CHEST 1 VIEW 08/29/2020: COMPARISON:  January 11, 2015  FINDINGS: The lungs are clear. Heart is mildly enlarged with pulmonary vascularity normal. No adenopathy. There is aortic atherosclerosis.  There is postoperative change in the lower cervical region.  IMPRESSION: Lungs clear.  Heart slightly enlarged.  Aortic Atherosclerosis (ICD10-I70.0).  TTE 05/31/2020: 1. Technically difficult echo with poor  image quality.  2. Left ventricular ejection fraction, by estimation, is 60 to 65%. The  left ventricle has normal function. The left ventricle has no regional  wall motion abnormalities. Left ventricular diastolic parameters are  consistent with Grade I diastolic  dysfunction (impaired relaxation).  3. Right ventricular systolic function is normal. The right ventricular  size is normal. There is normal pulmonary artery systolic pressure.  4. Left atrial size was mildly dilated.  5. The mitral valve is grossly normal. No evidence of mitral valve  regurgitation.  6. The aortic valve is calcified. Aortic valve regurgitation is not  visualized. Moderate aortic valve stenosis.  7. Aortic dilatation noted. There is moderate dilatation of the ascending  aorta, measuring 44 mm.  )       Anesthesia Quick Evaluation

## 2020-11-21 NOTE — Progress Notes (Signed)
Anesthesia Chart Review: Same day workup  Follows with cardiology for history of paroxysmal A. fib and moderate aortic stenosis.  He is maintained on Xarelto.  He had a negative stress test 02/15/2015.  Most recent echo October 2021 showed EF 60 to 65%, grade 1 DD, moderate AS (mean gradient 22.3 mmHg), moderate dilation of the ascending aorta (44 mm).  Previously cleared by cardiology in November 2021 to undergo vascular surgery. Per telephone encounter 06/10/2020, "Chart reviewed as part of pre-operative protocol coverage. Patient was last seen by Dr. Percival Spanish on 05/13/2020 at which time he was doing well. Patient was contacted on 06/09/2020 for further pre-op evaluation. He was doing well at that time from a cardiac standpoint. He denied any exertional chest pain or shortness of breath and was able to complete >4.0 METS. Dr. Percival Spanish did not feel like any additional ischemic evaluation was needed piror to procedure. Spoke with [name] in the cath lab who stated Xarelto is usually held for 48 hours prior to procedure. Per Dr. Percival Spanish, [place] to hold Xarelto. This should be restarted as soon as able following procedure."  Patient underwent abdominal aortic stent graft with right common femoral endarterectomy with bovine patch angioplasty 08/29/2020.  Patient will need day of surgery labs and evaluation.  EKG 05/13/2020: NSR.  Rate 65.  LAFB.  CTA abdomen pelvis 09/15/2020: IMPRESSION: VASCULAR  Status post AAA repair and right iliac aneurysm repair with a bifurcated endograft. Aneurysms appear excluded. Negative for endoleak.  No other acute intra-abdominal or pelvic vascular finding.  NON-VASCULAR  Postoperative right inguinal superficial 5.2 cm fluid collection over the femoral vessels may represent seroma, lymphocele, less likely hematoma or abscess.  No other acute intra-abdominopelvic finding  PORTABLE CHEST 1 VIEW 08/29/2020: COMPARISON:  January 11, 2015  FINDINGS: The lungs are  clear. Heart is mildly enlarged with pulmonary vascularity normal. No adenopathy. There is aortic atherosclerosis. There is postoperative change in the lower cervical region.  IMPRESSION: Lungs clear.  Heart slightly enlarged.  Aortic Atherosclerosis (ICD10-I70.0).  TTE 05/31/2020: 1. Technically difficult echo with poor image quality.  2. Left ventricular ejection fraction, by estimation, is 60 to 65%. The  left ventricle has normal function. The left ventricle has no regional  wall motion abnormalities. Left ventricular diastolic parameters are  consistent with Grade I diastolic  dysfunction (impaired relaxation).  3. Right ventricular systolic function is normal. The right ventricular  size is normal. There is normal pulmonary artery systolic pressure.  4. Left atrial size was mildly dilated.  5. The mitral valve is grossly normal. No evidence of mitral valve  regurgitation.  6. The aortic valve is calcified. Aortic valve regurgitation is not  visualized. Moderate aortic valve stenosis.  7. Aortic dilatation noted. There is moderate dilatation of the ascending  aorta, measuring 44 mm.   Barett, [facility] Short Stay Center/Anesthesiology Phone [phone] 11/21/2020 1:37 PM

## 2020-11-22 ENCOUNTER — Inpatient Hospital Stay (HOSPITAL_COMMUNITY)
Admission: RE | Admit: 2020-11-22 | Discharge: 2020-11-24 | DRG: 254 | Disposition: A | Discharge status: Home / Self Care | Payer: Medicare HMO | Attending: Vascular Surgery | Admitting: Vascular Surgery

## 2020-11-22 ENCOUNTER — Other Ambulatory Visit: Payer: Self-pay

## 2020-11-22 ENCOUNTER — Inpatient Hospital Stay (HOSPITAL_COMMUNITY): Payer: Medicare HMO | Admitting: Physician Assistant

## 2020-11-22 ENCOUNTER — Encounter (HOSPITAL_COMMUNITY): Payer: Self-pay | Admitting: Vascular Surgery

## 2020-11-22 ENCOUNTER — Encounter (HOSPITAL_COMMUNITY)
Admission: RE | Disposition: A | Discharge status: Home / Self Care | Payer: Self-pay | Source: Home / Self Care | Attending: Vascular Surgery

## 2020-11-22 ENCOUNTER — Inpatient Hospital Stay (HOSPITAL_COMMUNITY): Payer: Medicare HMO

## 2020-11-22 DIAGNOSIS — I350 Nonrheumatic aortic (valve) stenosis: Secondary | ICD-10-CM | POA: Diagnosis not present

## 2020-11-22 DIAGNOSIS — Z79899 Other long term (current) drug therapy: Secondary | ICD-10-CM

## 2020-11-22 DIAGNOSIS — Z7901 Long term (current) use of anticoagulants: Secondary | ICD-10-CM

## 2020-11-22 DIAGNOSIS — F17210 Nicotine dependence, cigarettes, uncomplicated: Secondary | ICD-10-CM | POA: Diagnosis not present

## 2020-11-22 DIAGNOSIS — M1611 Unilateral primary osteoarthritis, right hip: Secondary | ICD-10-CM | POA: Diagnosis not present

## 2020-11-22 DIAGNOSIS — I724 Aneurysm of artery of lower extremity: Secondary | ICD-10-CM | POA: Diagnosis not present

## 2020-11-22 DIAGNOSIS — Z20822 Contact with and (suspected) exposure to covid-19: Secondary | ICD-10-CM | POA: Diagnosis not present

## 2020-11-22 DIAGNOSIS — I480 Paroxysmal atrial fibrillation: Secondary | ICD-10-CM | POA: Diagnosis present

## 2020-11-22 DIAGNOSIS — I70212 Atherosclerosis of native arteries of extremities with intermittent claudication, left leg: Secondary | ICD-10-CM | POA: Diagnosis not present

## 2020-11-22 DIAGNOSIS — T82868A Thrombosis of vascular prosthetic devices, implants and grafts, initial encounter: Secondary | ICD-10-CM | POA: Diagnosis not present

## 2020-11-22 DIAGNOSIS — Z419 Encounter for procedure for purposes other than remedying health state, unspecified: Secondary | ICD-10-CM

## 2020-11-22 DIAGNOSIS — I739 Peripheral vascular disease, unspecified: Secondary | ICD-10-CM | POA: Diagnosis present

## 2020-11-22 HISTORY — PX: FEMORAL-POPLITEAL BYPASS GRAFT: SHX937

## 2020-11-22 HISTORY — DX: Malignant (primary) neoplasm, unspecified: C80.1

## 2020-11-22 HISTORY — DX: Cardiac arrhythmia, unspecified: I49.9

## 2020-11-22 LAB — URINALYSIS, ROUTINE W REFLEX MICROSCOPIC
Bacteria, UA: NONE SEEN
Bilirubin Urine: NEGATIVE
Glucose, UA: NEGATIVE mg/dL
Ketones, ur: NEGATIVE mg/dL
Leukocytes,Ua: NEGATIVE
Nitrite: NEGATIVE
Protein, ur: NEGATIVE mg/dL
Specific Gravity, Urine: 1.016 (ref 1.005–1.030)
pH: 5 (ref 5.0–8.0)

## 2020-11-22 LAB — COMPREHENSIVE METABOLIC PANEL
ALT: 16 U/L (ref 0–44)
AST: 16 U/L (ref 15–41)
Albumin: 3.4 g/dL — ABNORMAL LOW (ref 3.5–5.0)
Alkaline Phosphatase: 43 U/L (ref 38–126)
Anion gap: 7 (ref 5–15)
BUN: 14 mg/dL (ref 8–23)
CO2: 24 mmol/L (ref 22–32)
Calcium: 8.5 mg/dL — ABNORMAL LOW (ref 8.9–10.3)
Chloride: 107 mmol/L (ref 98–111)
Creatinine, Ser: 0.95 mg/dL (ref 0.61–1.24)
GFR, Estimated: >60 mL/min (ref >60)
Glucose, Bld: 91 mg/dL (ref 70–99)
Potassium: 4 mmol/L (ref 3.5–5.1)
Sodium: 138 mmol/L (ref 135–145)
Total Bilirubin: 0.9 mg/dL (ref 0.3–1.2)
Total Protein: 6.1 g/dL — ABNORMAL LOW (ref 6.5–8.1)

## 2020-11-22 LAB — CBC
HCT: 44.8 % (ref 39.0–52.0)
Hemoglobin: 13.9 g/dL (ref 13.0–17.0)
MCH: 28.3 pg (ref 26.0–34.0)
MCHC: 31 g/dL (ref 30.0–36.0)
MCV: 91.1 fL (ref 80.0–100.0)
Platelets: 191 10*3/uL (ref 150–400)
RBC: 4.92 MIL/uL (ref 4.22–5.81)
RDW: 15.6 % — ABNORMAL HIGH (ref 11.5–15.5)
WBC: 5.5 10*3/uL (ref 4.0–10.5)
nRBC: 0 % (ref 0.0–0.2)

## 2020-11-22 LAB — SURGICAL PCR SCREEN
MRSA, PCR: NEGATIVE
Staphylococcus aureus: NEGATIVE

## 2020-11-22 LAB — PROTIME-INR
INR: 1 (ref 0.8–1.2)
Prothrombin Time: 12.7 seconds (ref 11.4–15.2)

## 2020-11-22 LAB — SARS CORONAVIRUS 2 (TAT 6-24 HRS): SARS Coronavirus 2: NEGATIVE

## 2020-11-22 LAB — APTT: aPTT: 26 seconds (ref 24–36)

## 2020-11-22 LAB — TYPE AND SCREEN
ABO/RH(D): A POS
Antibody Screen: NEGATIVE

## 2020-11-22 SURGERY — BYPASS GRAFT FEMORAL-POPLITEAL ARTERY
Anesthesia: General | Site: Leg Lower | Laterality: Left

## 2020-11-22 MED ORDER — 0.9 % SODIUM CHLORIDE (POUR BTL) OPTIME
TOPICAL | Status: DC | PRN
  Administered 2020-11-22: 2000 mL

## 2020-11-22 MED ORDER — FENTANYL CITRATE (PF) 100 MCG/2ML IJ SOLN
INTRAMUSCULAR | Status: AC
  Filled 2020-11-22: qty 2

## 2020-11-22 MED ORDER — ONDANSETRON HCL 4 MG/2ML IJ SOLN: 4.0000 mg | Freq: Four times a day (QID) | INTRAMUSCULAR | Status: DC | PRN

## 2020-11-22 MED ORDER — PROPOFOL 10 MG/ML IV BOLUS
INTRAVENOUS | Status: DC | PRN
  Administered 2020-11-22: 70 mg via INTRAVENOUS

## 2020-11-22 MED ORDER — PHENYLEPHRINE 40 MCG/ML (10ML) SYRINGE FOR IV PUSH (FOR BLOOD PRESSURE SUPPORT)
PREFILLED_SYRINGE | INTRAVENOUS | Status: AC
  Filled 2020-11-22: qty 10

## 2020-11-22 MED ORDER — ONDANSETRON HCL 4 MG/2ML IJ SOLN
INTRAMUSCULAR | Status: AC
  Filled 2020-11-22: qty 2

## 2020-11-22 MED ORDER — ETOMIDATE 2 MG/ML IV SOLN
INTRAVENOUS | Status: DC | PRN
  Administered 2020-11-22: 12 mg via INTRAVENOUS

## 2020-11-22 MED ORDER — ROCURONIUM BROMIDE 10 MG/ML (PF) SYRINGE
PREFILLED_SYRINGE | INTRAVENOUS | Status: AC
  Filled 2020-11-22: qty 10

## 2020-11-22 MED ORDER — HEPARIN SODIUM (PORCINE) 1000 UNIT/ML IJ SOLN
INTRAMUSCULAR | Status: AC
  Filled 2020-11-22: qty 2

## 2020-11-22 MED ORDER — MAGNESIUM SULFATE 2 GM/50ML IV SOLN: 2.0000 g | Freq: Every day | INTRAVENOUS | Status: DC | PRN

## 2020-11-22 MED ORDER — PROPOFOL 10 MG/ML IV BOLUS
INTRAVENOUS | Status: AC
  Filled 2020-11-22: qty 20

## 2020-11-22 MED ORDER — SODIUM CHLORIDE 0.9 % IV SOLN
INTRAVENOUS | Status: DC | PRN
  Administered 2020-11-22: 500 mL

## 2020-11-22 MED ORDER — SODIUM CHLORIDE 0.9 % IV SOLN: INTRAVENOUS | Status: AC

## 2020-11-22 MED ORDER — HYDRALAZINE HCL 20 MG/ML IJ SOLN: 5.0000 mg | INTRAMUSCULAR | Status: DC | PRN

## 2020-11-22 MED ORDER — PHENYLEPHRINE 40 MCG/ML (10ML) SYRINGE FOR IV PUSH (FOR BLOOD PRESSURE SUPPORT)
PREFILLED_SYRINGE | INTRAVENOUS | Status: DC | PRN
  Administered 2020-11-22: 50 ug via INTRAVENOUS

## 2020-11-22 MED ORDER — DEXAMETHASONE SODIUM PHOSPHATE 10 MG/ML IJ SOLN
INTRAMUSCULAR | Status: AC
  Filled 2020-11-22: qty 1

## 2020-11-22 MED ORDER — MORPHINE SULFATE (PF) 2 MG/ML IV SOLN
2.0000 mg | INTRAVENOUS | Status: DC | PRN
Start: 2020-11-22 — End: 2020-11-24
  Administered 2020-11-22: 2 mg via INTRAVENOUS

## 2020-11-22 MED ORDER — LIDOCAINE 2% (20 MG/ML) 5 ML SYRINGE
INTRAMUSCULAR | Status: DC | PRN
  Administered 2020-11-22: 100 mg via INTRAVENOUS

## 2020-11-22 MED ORDER — CHLORHEXIDINE GLUCONATE 0.12 % MT SOLN
15.0000 mL | Freq: Once | OROMUCOSAL | Status: AC
  Administered 2020-11-22: 15 mL via OROMUCOSAL
  Filled 2020-11-22: qty 15

## 2020-11-22 MED ORDER — IOHEXOL 300 MG/ML  SOLN
INTRAMUSCULAR | Status: DC | PRN
  Administered 2020-11-22: 28 mL via INTRA_ARTERIAL

## 2020-11-22 MED ORDER — DOCUSATE SODIUM 100 MG PO CAPS
100.0000 mg | ORAL_CAPSULE | Freq: Every day | ORAL | Status: DC
  Administered 2020-11-23 – 2020-11-24 (×2): 100 mg via ORAL
  Filled 2020-11-22 (×2): qty 1

## 2020-11-22 MED ORDER — LIDOCAINE 2% (20 MG/ML) 5 ML SYRINGE
INTRAMUSCULAR | Status: AC
  Filled 2020-11-22: qty 5

## 2020-11-22 MED ORDER — PHENYLEPHRINE HCL-NACL 10-0.9 MG/250ML-% IV SOLN
INTRAVENOUS | Status: DC | PRN
  Administered 2020-11-22: 40 ug/min via INTRAVENOUS

## 2020-11-22 MED ORDER — HEPARIN SODIUM (PORCINE) 1000 UNIT/ML IJ SOLN
INTRAMUSCULAR | Status: DC | PRN
  Administered 2020-11-22: 10000 [IU] via INTRAVENOUS
  Administered 2020-11-22: 5000 [IU] via INTRAVENOUS

## 2020-11-22 MED ORDER — SUGAMMADEX SODIUM 200 MG/2ML IV SOLN
INTRAVENOUS | Status: DC | PRN
  Administered 2020-11-22: 200 mg via INTRAVENOUS

## 2020-11-22 MED ORDER — SODIUM CHLORIDE 0.9 % IV SOLN: INTRAVENOUS | Status: DC

## 2020-11-22 MED ORDER — CHLORHEXIDINE GLUCONATE CLOTH 2 % EX PADS: 6.0000 | MEDICATED_PAD | Freq: Once | CUTANEOUS | Status: DC

## 2020-11-22 MED ORDER — DEXAMETHASONE SODIUM PHOSPHATE 10 MG/ML IJ SOLN
INTRAMUSCULAR | Status: DC | PRN
  Administered 2020-11-22: 5 mg via INTRAVENOUS

## 2020-11-22 MED ORDER — SODIUM CHLORIDE 0.9 % IV SOLN
INTRAVENOUS | Status: AC
  Filled 2020-11-22: qty 1.2

## 2020-11-22 MED ORDER — LABETALOL HCL 5 MG/ML IV SOLN: 10.0000 mg | INTRAVENOUS | Status: DC | PRN

## 2020-11-22 MED ORDER — ALUM & MAG HYDROXIDE-SIMETH 200-200-20 MG/5ML PO SUSP: 15.0000 mL | ORAL | Status: DC | PRN

## 2020-11-22 MED ORDER — ROSUVASTATIN CALCIUM 5 MG PO TABS
10.0000 mg | ORAL_TABLET | Freq: Every day | ORAL | Status: DC
  Administered 2020-11-23 – 2020-11-24 (×2): 10 mg via ORAL
  Filled 2020-11-22 (×2): qty 2

## 2020-11-22 MED ORDER — ACETAMINOPHEN 325 MG PO TABS
325.0000 mg | ORAL_TABLET | ORAL | Status: DC | PRN
Start: 2020-11-22 — End: 2020-11-24

## 2020-11-22 MED ORDER — FENTANYL CITRATE (PF) 250 MCG/5ML IJ SOLN
INTRAMUSCULAR | Status: DC | PRN
  Administered 2020-11-22: 50 ug via INTRAVENOUS
  Administered 2020-11-22: 150 ug via INTRAVENOUS
  Administered 2020-11-22: 50 ug via INTRAVENOUS

## 2020-11-22 MED ORDER — ALBUMIN HUMAN 5 % IV SOLN: INTRAVENOUS | Status: DC | PRN

## 2020-11-22 MED ORDER — SODIUM CHLORIDE 0.9 % IV SOLN: 500.0000 mL | Freq: Once | INTRAVENOUS | Status: DC | PRN

## 2020-11-22 MED ORDER — OXYCODONE-ACETAMINOPHEN 5-325 MG PO TABS
1.0000 | ORAL_TABLET | ORAL | Status: DC | PRN
  Administered 2020-11-22 – 2020-11-23 (×2): 1 via ORAL
  Administered 2020-11-23 (×3): 2 via ORAL
  Administered 2020-11-24 (×2): 1 via ORAL
  Filled 2020-11-22 (×4): qty 2
  Filled 2020-11-22 (×3): qty 1

## 2020-11-22 MED ORDER — HEPARIN SODIUM (PORCINE) 5000 UNIT/ML IJ SOLN
5000.0000 [IU] | Freq: Three times a day (TID) | INTRAMUSCULAR | Status: DC
  Administered 2020-11-23 – 2020-11-24 (×4): 5000 [IU] via SUBCUTANEOUS
  Filled 2020-11-22 (×4): qty 1

## 2020-11-22 MED ORDER — CEFAZOLIN SODIUM-DEXTROSE 2-4 GM/100ML-% IV SOLN
2.0000 g | INTRAVENOUS | Status: AC
  Administered 2020-11-22: 2 g via INTRAVENOUS
  Filled 2020-11-22: qty 100

## 2020-11-22 MED ORDER — LACTATED RINGERS IV SOLN: INTRAVENOUS | Status: DC

## 2020-11-22 MED ORDER — FENTANYL CITRATE (PF) 100 MCG/2ML IJ SOLN
25.0000 ug | INTRAMUSCULAR | Status: DC | PRN
  Administered 2020-11-22 (×3): 50 ug via INTRAVENOUS

## 2020-11-22 MED ORDER — GUAIFENESIN-DM 100-10 MG/5ML PO SYRP: 15.0000 mL | ORAL_SOLUTION | ORAL | Status: DC | PRN

## 2020-11-22 MED ORDER — CEFAZOLIN SODIUM-DEXTROSE 2-4 GM/100ML-% IV SOLN
2.0000 g | Freq: Three times a day (TID) | INTRAVENOUS | Status: AC
  Administered 2020-11-22 – 2020-11-23 (×2): 2 g via INTRAVENOUS
  Filled 2020-11-22 (×2): qty 100

## 2020-11-22 MED ORDER — GLYCOPYRROLATE PF 0.2 MG/ML IJ SOSY
PREFILLED_SYRINGE | INTRAMUSCULAR | Status: AC
  Filled 2020-11-22: qty 1

## 2020-11-22 MED ORDER — POTASSIUM CHLORIDE CRYS ER 20 MEQ PO TBCR: 20.0000 meq | EXTENDED_RELEASE_TABLET | Freq: Every day | ORAL | Status: DC | PRN

## 2020-11-22 MED ORDER — ETOMIDATE 2 MG/ML IV SOLN
INTRAVENOUS | Status: AC
  Filled 2020-11-22: qty 10

## 2020-11-22 MED ORDER — ORAL CARE MOUTH RINSE: 15.0000 mL | Freq: Once | OROMUCOSAL | Status: AC

## 2020-11-22 MED ORDER — ACETAMINOPHEN 650 MG RE SUPP
325.0000 mg | RECTAL | Status: DC | PRN
Start: 2020-11-22 — End: 2020-11-24

## 2020-11-22 MED ORDER — PANTOPRAZOLE SODIUM 40 MG PO TBEC
40.0000 mg | DELAYED_RELEASE_TABLET | Freq: Every day | ORAL | Status: DC
  Administered 2020-11-23 – 2020-11-24 (×2): 40 mg via ORAL
  Filled 2020-11-22 (×2): qty 1

## 2020-11-22 MED ORDER — PROTAMINE SULFATE 10 MG/ML IV SOLN
INTRAVENOUS | Status: DC | PRN
  Administered 2020-11-22: 50 mg via INTRAVENOUS

## 2020-11-22 MED ORDER — ONDANSETRON HCL 4 MG/2ML IJ SOLN
INTRAMUSCULAR | Status: DC | PRN
  Administered 2020-11-22: 4 mg via INTRAVENOUS

## 2020-11-22 MED ORDER — MORPHINE SULFATE (PF) 2 MG/ML IV SOLN
INTRAVENOUS | Status: AC
  Filled 2020-11-22: qty 1

## 2020-11-22 MED ORDER — ARTIFICIAL TEARS OPHTHALMIC OINT
TOPICAL_OINTMENT | OPHTHALMIC | Status: AC
  Filled 2020-11-22: qty 3.5

## 2020-11-22 MED ORDER — METOPROLOL TARTRATE 5 MG/5ML IV SOLN: 2.0000 mg | INTRAVENOUS | Status: DC | PRN

## 2020-11-22 MED ORDER — GLYCOPYRROLATE PF 0.2 MG/ML IJ SOSY
PREFILLED_SYRINGE | INTRAMUSCULAR | Status: DC | PRN
  Administered 2020-11-22: .2 mg via INTRAVENOUS

## 2020-11-22 MED ORDER — METOPROLOL TARTRATE 25 MG PO TABS: 25.0000 mg | ORAL_TABLET | Freq: Two times a day (BID) | ORAL | Status: DC | PRN

## 2020-11-22 MED ORDER — DILTIAZEM HCL ER COATED BEADS 120 MG PO CP24
120.0000 mg | ORAL_CAPSULE | Freq: Every day | ORAL | Status: DC
  Administered 2020-11-23 – 2020-11-24 (×2): 120 mg via ORAL
  Filled 2020-11-22 (×2): qty 1

## 2020-11-22 MED ORDER — PHENOL 1.4 % MT LIQD: 1.0000 | OROMUCOSAL | Status: DC | PRN

## 2020-11-22 MED ORDER — FENTANYL CITRATE (PF) 250 MCG/5ML IJ SOLN
INTRAMUSCULAR | Status: AC
  Filled 2020-11-22: qty 5

## 2020-11-22 MED ORDER — ROCURONIUM BROMIDE 10 MG/ML (PF) SYRINGE
PREFILLED_SYRINGE | INTRAVENOUS | Status: DC | PRN
  Administered 2020-11-22 (×2): 50 mg via INTRAVENOUS
  Administered 2020-11-22: 30 mg via INTRAVENOUS

## 2020-11-22 SURGICAL SUPPLY — 49 items
BANDAGE ESMARK 6X9 LF (GAUZE/BANDAGES/DRESSINGS) IMPLANT
BNDG ESMARK 6X9 LF (GAUZE/BANDAGES/DRESSINGS)
CANISTER SUCT 3000ML PPV (MISCELLANEOUS) ×2 IMPLANT
CANNULA VESSEL 3MM 2 BLNT TIP (CANNULA) ×4 IMPLANT
CLIP VESOCCLUDE MED 24/CT (CLIP) ×4 IMPLANT
CLIP VESOCCLUDE SM WIDE 24/CT (CLIP) ×2 IMPLANT
COVER PROBE W GEL 5X96 (DRAPES) ×2 IMPLANT
CUFF TOURN SGL QUICK 24 (TOURNIQUET CUFF)
CUFF TOURN SGL QUICK 34 (TOURNIQUET CUFF)
CUFF TOURN SGL QUICK 42 (TOURNIQUET CUFF) IMPLANT
CUFF TRNQT CYL 24X4X16.5-23 (TOURNIQUET CUFF) IMPLANT
CUFF TRNQT CYL 34X4.125X (TOURNIQUET CUFF) IMPLANT
DERMABOND ADVANCED (GAUZE/BANDAGES/DRESSINGS) ×4
DERMABOND ADVANCED .7 DNX12 (GAUZE/BANDAGES/DRESSINGS) ×4 IMPLANT
DRAIN HEMOVAC 1/8 X 5 (WOUND CARE) IMPLANT
DRAPE HALF SHEET 40X57 (DRAPES) IMPLANT
DRAPE X-RAY CASS 24X20 (DRAPES) IMPLANT
ELECT REM PT RETURN 9FT ADLT (ELECTROSURGICAL) ×2
ELECTRODE REM PT RTRN 9FT ADLT (ELECTROSURGICAL) ×1 IMPLANT
EVACUATOR SILICONE 100CC (DRAIN) IMPLANT
GOWN STRL REUS W/ TWL LRG LVL3 (GOWN DISPOSABLE) ×3 IMPLANT
GOWN STRL REUS W/TWL LRG LVL3 (GOWN DISPOSABLE) ×6
HEMOSTAT SPONGE AVITENE ULTRA (HEMOSTASIS) IMPLANT
KIT BASIN OR (CUSTOM PROCEDURE TRAY) ×2 IMPLANT
KIT TURNOVER KIT B (KITS) ×2 IMPLANT
NS IRRIG 1000ML POUR BTL (IV SOLUTION) ×4 IMPLANT
PACK PERIPHERAL VASCULAR (CUSTOM PROCEDURE TRAY) ×2 IMPLANT
PAD ARMBOARD 7.5X6 YLW CONV (MISCELLANEOUS) ×4 IMPLANT
SET COLLECT BLD 21X3/4 12 (NEEDLE) ×2 IMPLANT
STOPCOCK 4 WAY LG BORE MALE ST (IV SETS) ×2 IMPLANT
SUT PROLENE 5 0 C 1 24 (SUTURE) ×2 IMPLANT
SUT PROLENE 6 0 CC (SUTURE) ×6 IMPLANT
SUT PROLENE 7 0 BV 1 (SUTURE) ×8 IMPLANT
SUT PROLENE 7 0 BV1 MDA (SUTURE) IMPLANT
SUT SILK 2 0 SH (SUTURE) ×4 IMPLANT
SUT SILK 3 0 (SUTURE) ×2
SUT SILK 3-0 18XBRD TIE 12 (SUTURE) ×1 IMPLANT
SUT VIC AB 2-0 SH 27 (SUTURE) ×6
SUT VIC AB 2-0 SH 27XBRD (SUTURE) ×3 IMPLANT
SUT VIC AB 3-0 SH 27 (SUTURE) ×8
SUT VIC AB 3-0 SH 27X BRD (SUTURE) ×4 IMPLANT
SUT VIC AB 4-0 PS2 27 (SUTURE) ×6 IMPLANT
SYR 10ML LL (SYRINGE) ×4 IMPLANT
TAPE UMBILICAL COTTON 1/8X30 (MISCELLANEOUS) ×2 IMPLANT
TOWEL GREEN STERILE (TOWEL DISPOSABLE) ×2 IMPLANT
TRAY FOLEY MTR SLVR 16FR STAT (SET/KITS/TRAYS/PACK) ×2 IMPLANT
TUBING EXTENTION W/L.L. (IV SETS) ×2 IMPLANT
UNDERPAD 30X36 HEAVY ABSORB (UNDERPADS AND DIAPERS) ×2 IMPLANT
WATER STERILE IRR 1000ML POUR (IV SOLUTION) ×2 IMPLANT

## 2020-11-22 NOTE — Anesthesia Procedure Notes (Signed)
Arterial Line Insertion Start/End4/19/2022 1:05 AM, 11/22/2020 1:18 AM Performed by: Oletta Lamas, CRNA, CRNA  Preanesthetic checklist: patient identified, IV checked, site marked, risks and benefits discussed, surgical consent, monitors and equipment checked, pre-op evaluation, timeout performed and anesthesia consent Lidocaine 1% used for infiltration Left, radial was placed Catheter size: 20 G Hand hygiene performed , maximum sterile barriers used  and Seldinger technique used Allen's test indicative of satisfactory collateral circulation Attempts: 1 Procedure performed without using ultrasound guided technique. Following insertion, dressing applied and Biopatch. Post procedure assessment: normal  Patient tolerated the procedure well with no immediate complications.

## 2020-11-22 NOTE — Interval H&P Note (Signed)
History and Physical Interval Note:  11/22/2020 1:31 PM  Cory [name]  has presented today for surgery, with the diagnosis of PAD.  The various methods of treatment have been discussed with the patient and family. After consideration of risks, benefits and other options for treatment, the patient has consented to  Procedure(s): LEFT ABOVE KNEE-BELOW KNEE BYPASS (Left) as a surgical intervention.  The patient's history has been reviewed, patient examined, no change in status, stable for surgery.  I have reviewed the patient's chart and labs.  Questions were answered to the patient's satisfaction.     [name]

## 2020-11-22 NOTE — Transfer of Care (Signed)
Immediate Anesthesia Transfer of Care Note  Patient: Cory [name]  Procedure(s) Performed: LEFT ABOVE KNEE-BELOW KNEE BYPASS TIBIAL PERITONEAL TRUNK REVERSE IPSILATERAL GREATER SAPHENOUS VEIN (Left Leg Lower)  Patient Location: PACU  Anesthesia Type:General  Level of Consciousness: awake, alert  and oriented  Airway & Oxygen Therapy: Patient Spontanous Breathing and Patient connected to face mask oxygen  Post-op Assessment: Report given to RN and Post -op Vital signs reviewed and stable  Post vital signs: Reviewed and stable  Last Vitals:  Vitals Value Taken Time  BP 140/83 11/22/20 1830  Temp    Pulse 78 11/22/20 1833  Resp 11 11/22/20 1833  SpO2 99 % 11/22/20 1833  Vitals shown include unvalidated device data.  Last Pain:  Vitals:   11/22/20 1313  TempSrc:   PainSc: 0-No pain         Complications: No complications documented.

## 2020-11-22 NOTE — Anesthesia Procedure Notes (Signed)
Procedure Name: Intubation Date/Time: 11/22/2020 2:16 PM Performed by: Alain Marion, CRNA Pre-anesthesia Checklist: Patient identified, Emergency Drugs available, Suction available and Patient being monitored Patient Re-evaluated:Patient Re-evaluated prior to induction Oxygen Delivery Method: Circle System Utilized Preoxygenation: Pre-oxygenation with 100% oxygen Induction Type: IV induction Ventilation: Mask ventilation without difficulty Laryngoscope Size: Mac Grade View: Grade I Tube type: Oral Tube size: 7.5 mm Number of attempts: 1 Airway Equipment and Method: Stylet and Oral airway Placement Confirmation: ETT inserted through vocal cords under direct vision,  positive ETCO2 and breath sounds checked- equal and bilateral Secured at: 22 cm Tube secured with: Tape Dental Injury: Teeth and Oropharynx as per pre-operative assessment

## 2020-11-22 NOTE — Op Note (Signed)
Procedure: Left above-knee to below-knee popliteal/tibioperoneal trunk bypass with ipsilateral reversed left greater saphenous vein, intraoperative arteriogram  Preoperative diagnosis: Thrombosed popliteal aneurysm left leg  Postoperative diagnosis: Same  Anesthesia: General  Assistant: Curt Jews MD, Arlee Muslim, PA-C to expedite procedure and provide adequate exposure and harvest the saphenous vein  Operative findings: Good quality saphenous vein 4 mm diameter  Distal anastomosis to the popliteal below-knee artery right at the takeoff of all 3 tibial vessels extending with the toe onto the tibioperoneal trunk  Dominant runoff vessel is the posterior tibial artery, palpable posterior tibial pulse at conclusion of case  Operative details: After obtaining informed consent, the patient was taken the operating.  The patient was placed in supine position operating table.  After induction of general anesthesia and endotracheal ovation patient's left lower extremities prepped and draped in usual sterile fashion.  Ultrasound was used to identify the course of the left greater saphenous vein.  This was marked.  Next longitudinal incision was made just below the groin crease carried out through subcutaneous tissues down the level of left greater saphenous vein.  Saphenous vein was harvested from the groin crease all the way down to the knee joint.  It was of good quality about 4 mm in diameter.  It was dissected free circumferentially and small side branches ligated divided tween silk ties or clips.  This was done through 2 incisions one extending from the groin crease to the mid thigh and the other one extending from the mid thigh down to the knee joint region.  After the vein had been completely mobilized the incision at the above-knee popliteal space was deepened down to expose the above-knee popliteal artery.  It had a good quality pulse right at the adductor hiatus.  It was dissected free  circumferentially.  It was ectatic.  It was probably about 6 mm in diameter.  About 2 cm below this the artery had no palpable pulse and was presumably occluded.  Several geniculate branches were dissected free circumferentially and Vesseloops placed around these to preserve them.  Next a longitudinal incision was made below the knee on the medial aspect the leg carried on through subcutaneous tissues down the level greater saphenous vein.  This was reflected posteriorly.  A couple of branches were ligated divided tween silk ties or clips to mobilize this.  The incision was then deepened into the below-knee popliteal space.  The below-knee popliteal artery anterior tibial artery and tibioperoneal trunk were all dissected free circumferentially and Vesseloops placed around these.  A tunnel was then created between the heads of the gastrocnemius muscle up to the above-knee popliteal space.  An umbilical tape was placed through this.  The vein was transected about the level of the knee joint and at the saphenofemoral junction.  It was placed in reverse configuration.  It was gently distended to make sure it was hemostatic.  The patient was given 10,000 units of intravenous heparin.  He was given an additional 5000 units of heparin during the course of the case.  Next the above-knee popliteal artery was controlled proximally with a Henley clamp and distally with a peripheral DeBakey clamp.  Longitudinal opening was made in this.  It was moderately calcified and thickened.  The vein was placed in a reverse configuration and sewn in end of vein to side of artery using a running 5-0 Prolene suture.  Despite completion anastomosis it was for blood backbled and thoroughly flushed anastomosis was secured clamps released was pulsatile  flow in the graft immediately.  2 repair stitches were placed.  Graft was then marked for orientation and brought through the pre-existing tunnel.  The below-knee popliteal artery was controlled  with a Henley clamp.  The anterior tibial artery was controlled with a vessel loop.  The tibioperoneal trunk was controlled with a fine bulldog clamp.  Longitudinal opening was made in the very distal portion of the below-knee popliteal artery and extended past the origin of the anterior tibial artery and about 2 cm into the tibial peroneal trunk.  The vein was spatulated and sewn on as a long anastomosis incorporating the tibioperoneal trunk the anterior tibial artery and the distal below-knee popliteal artery.  There was a small amount of thrombus in the below-knee popliteal artery and this was removed under direct vision.  This prior to completion of anastomosis it was for blood backbled and thoroughly flushed reanastomosed was secured clamps released there is pulsatile flow in the tibioperoneal trunk and anterior tibial artery origin immediately.  The patient also had a palpable posterior tibial pulse.  There was good Doppler flow to the posterior tibial artery and through the vein graft as well.  The below-knee popliteal artery was then ligated about 3 cm above the anastomosis.  The above-knee popliteal artery was ligated about 2 cm below the proximal anastomosis.  This was done with a 2-0 silk tie in both locations.  An intraoperative arteriogram was then performed by introducing a 23-gauge bladder 5 needle into the proximal vein graft.  This was done with inflow occlusion.  This showed a patent distal anastomosis with two-vessel runoff as the posterior tibial artery was the dominant runoff vessel to the foot but this does fill across the plantar arch and fill the distal dorsalis pedis artery.  The needle was removed and the hole repaired with a Prolene stitch.  Hemostasis was obtained with direct pressure and 50 mg of protamine.  The deep layers of both the above and below-knee popliteal incisions were closed with multiple layers of running 203 0 Vicryl suture and a 4-0 Vicryl subcuticular stitch in the skin.   The saphenous vein harvest site in the higher thigh was closed with a running 3-0 Vicryl suture followed by 4-0 Vicryl subcuticular stitch in the skin.  Dermabond was applied to all incisions.  The patient tolerated the procedure well and there were no complications.  The instrument sponge and needle count was correct end of the case.  The patient was taken the recovery in stable condition.  [name], MD [facility] [place] Office: [phone]

## 2020-11-23 ENCOUNTER — Encounter (HOSPITAL_COMMUNITY): Payer: Self-pay | Admitting: Vascular Surgery

## 2020-11-23 DIAGNOSIS — Z95828 Presence of other vascular implants and grafts: Secondary | ICD-10-CM

## 2020-11-23 LAB — BASIC METABOLIC PANEL
Anion gap: 4 — ABNORMAL LOW (ref 5–15)
BUN: 10 mg/dL (ref 8–23)
CO2: 26 mmol/L (ref 22–32)
Calcium: 8.1 mg/dL — ABNORMAL LOW (ref 8.9–10.3)
Chloride: 104 mmol/L (ref 98–111)
Creatinine, Ser: 0.91 mg/dL (ref 0.61–1.24)
GFR, Estimated: >60 mL/min (ref >60)
Glucose, Bld: 155 mg/dL — ABNORMAL HIGH (ref 70–99)
Potassium: 4.2 mmol/L (ref 3.5–5.1)
Sodium: 134 mmol/L — ABNORMAL LOW (ref 135–145)

## 2020-11-23 LAB — CBC
HCT: 37.9 % — ABNORMAL LOW (ref 39.0–52.0)
Hemoglobin: 12.2 g/dL — ABNORMAL LOW (ref 13.0–17.0)
MCH: 28.6 pg (ref 26.0–34.0)
MCHC: 32.2 g/dL (ref 30.0–36.0)
MCV: 88.8 fL (ref 80.0–100.0)
Platelets: 202 10*3/uL (ref 150–400)
RBC: 4.27 MIL/uL (ref 4.22–5.81)
RDW: 15.7 % — ABNORMAL HIGH (ref 11.5–15.5)
WBC: 8.8 10*3/uL (ref 4.0–10.5)
nRBC: 0 % (ref 0.0–0.2)

## 2020-11-23 NOTE — Anesthesia Postprocedure Evaluation (Signed)
Anesthesia Post Note  Patient: Cory [name]  Procedure(s) Performed: LEFT ABOVE KNEE-BELOW KNEE BYPASS TIBIAL PERITONEAL TRUNK REVERSE IPSILATERAL GREATER SAPHENOUS VEIN (Left Leg Lower)     Patient location during evaluation: PACU Anesthesia Type: General Level of consciousness: awake Pain management: pain level controlled Vital Signs Assessment: post-procedure vital signs reviewed and stable Respiratory status: spontaneous breathing Cardiovascular status: stable Postop Assessment: no apparent nausea or vomiting Anesthetic complications: no   No complications documented.  Last Vitals:  Vitals:   11/23/20 1638 11/23/20 2025  BP: 107/67 (!) 114/59  Pulse: 75   Resp: 12 17  Temp: 36.4 C 36.4 C  SpO2: 98% 96%    Last Pain:  Vitals:   11/23/20 2025  TempSrc: Oral  PainSc: 0-No pain                 Khaniyah Bezek

## 2020-11-23 NOTE — Progress Notes (Signed)
PT Cancellation Note  Patient Details Name: Cory [name] MRN: [id number] DOB: Aug 18, 1945   Cancelled Treatment:    Reason Eval/Treat Not Completed: Pain limiting ability to participate - pt requesting pain medication prior to PT eval, will check back later this am per pt request.  Stacie Glaze, PT DPT Acute Rehabilitation Services Pager [phone]  Office [phone]    Louis Matte 11/23/2020, 8:32 AM

## 2020-11-23 NOTE — Progress Notes (Signed)
Client sitting up in chair upon in stable condition watching television.  Client has completed breakfast.  VSS, manual BP completed.  Client states pain level of 5 on 0-10 pain scale to left leg.  Given scheduled meds along with PRN pain medication by mouth.  Tolerated well.  Per PT, will return after client has had pain medication.  Will continue to follow doctors orders.  Call light within reach of client.

## 2020-11-23 NOTE — Progress Notes (Signed)
Mobility Specialist - Progress Note   11/23/20 1159  Mobility  Activity Ambulated in hall  Level of Assistance Standby assist, set-up cues, supervision of patient - no hands on  Assistive Device Front wheel walker  Distance Ambulated (ft) 190 ft  Mobility Response Tolerated well  Mobility performed by Mobility specialist  $Mobility charge 1 Mobility   Pre-mobility:70  HR, 98/58 BP Post-mobility: 73 HR, 99/62 BP  Pt states pain improved compared to his earlier walk. Pt back to recliner after walk.   Pricilla Handler Mobility Specialist Mobility Specialist Phone: [phone]

## 2020-11-23 NOTE — Progress Notes (Addendum)
[facility] [place]  Subjective  - left leg is sore   Objective 105/68 71 97.8 F (36.6 C) (Oral) 16 93%  Intake/Output Summary (Last 24 hours) at 11/23/2020 5436 Last data filed at 11/23/2020 0600 Gross per 24 hour  Intake 3015.13 ml  Output 2525 ml  Net 490.13 ml   Right leg edema at baseline 2+ PT pulse left foot  Incisions clean no hematoma  Assessment/Planning: Patent ak to bk pop vein bypass with palpable PT pulse Ambulate today D/c home when pain controlled Resume anticoagulation at home dose on d/c or Friday if pt stays inpt  [name] 11/23/2020 8:22 AM --  Laboratory Lab Results: Recent Labs    11/22/20 1240 11/23/20 0058  WBC 5.5 8.8  HGB 13.9 12.2*  HCT 44.8 37.9*  PLT 191 202   BMET Recent Labs    11/22/20 1240 11/23/20 0058  NA 138 134*  K 4.0 4.2  CL 107 104  CO2 24 26  GLUCOSE 91 155*  BUN 14 10  CREATININE 0.95 0.91  CALCIUM 8.5* 8.1*    COAG Lab Results  Component Value Date   INR 1.0 11/22/2020   INR 1.2 08/29/2020   INR 1.3 (H) 08/29/2020   No results found for: PTT

## 2020-11-23 NOTE — Evaluation (Signed)
Occupational Therapy Evaluation Patient Details Name: Cory [name] MRN: [id number] DOB: 07/04/1946 Today's Date: 11/23/2020    History of Present Illness Cory [name] is a 75 y.o. male s/p L above-knee to below-knee popliteal/tibioperoneal trunk bypass with ipsilateral reversed left greater saphenous vein, intraoperative arteriogram on 4/19. Pt with recent LLE arteriogram 4/14. PMHx:HTN, Afib, PVD, abdominal aortic endovascular stent graft 08/29/20, R patch angioplasty popliteal and common femoral artery 08/29/20, femoral endarterectomy 08/29/20.   Clinical Impression   Patient admitted for the diagnosis and procedure above.  PTA he was independent with all care and mobility and living with his spouse.  Patient is close to his baseline for ADL from a sit/stand level, and spouse can assist as needed at home.  He is walking in the halls with staff, and notes his leg is not as painful.  He is still reaching for objects in his environment, but no real balance deficits exist.  No further OT needs in the acute setting, and no post acute needs are anticipated.      Follow Up Recommendations  No OT follow up    Equipment Recommendations  None recommended by OT    Recommendations for Other Services       Precautions / Restrictions Precautions Precautions: None Precaution Comments: medial thigh and lower leg incisions Restrictions Weight Bearing Restrictions: No      Mobility Bed Mobility Overal bed mobility: Independent             General bed mobility comments: up in recliner    Transfers Overall transfer level: Modified independent Equipment used: None Transfers: Sit to/from Stand Sit to Stand: Supervision         General transfer comment: for safety, increased time to rise and reaching for environment to self-steady upon standing    Balance Overall balance assessment: Mild deficits observed, not formally tested                                          ADL either performed or assessed with clinical judgement   ADL Overall ADL's : At baseline                                       General ADL Comments: able to complete ADL from a sit/stand level     Vision Patient Visual Report: No change from baseline       Perception     Praxis      Pertinent Vitals/Pain Pain Assessment: 0-10 Pain Score: 2  Pain Location: LLE Pain Descriptors / Indicators: Sore;Discomfort;Grimacing Pain Intervention(s): Monitored during session     Hand Dominance Right   Extremity/Trunk Assessment Upper Extremity Assessment Upper Extremity Assessment: Overall WFL for tasks assessed   Lower Extremity Assessment Lower Extremity Assessment: Defer to PT evaluation RLE Deficits / Details: limited by pain; able to reach terminal knee extension with repeated LAQs. strength WFL, limited by pain   Cervical / Trunk Assessment Cervical / Trunk Assessment: Normal   Communication Communication Communication: No difficulties   Cognition Arousal/Alertness: Awake/alert Behavior During Therapy: WFL for tasks assessed/performed Overall Cognitive Status: Within Functional Limits for tasks assessed  General Comments       Exercises General Exercises - Lower Extremity Long Arc Quad: AROM;Left;5 reps;Seated   Shoulder Instructions      Home Living Family/patient expects to be discharged to:: Private residence Living Arrangements: Spouse/significant other Available Help at Discharge: Family;Available 24 hours/day Type of Home: House Home Access: Stairs to enter [employer] of Steps: 1   Home Layout: Two level;Full bath on main level Alternate Level Stairs-Number of Steps: flight   Bathroom Shower/Tub: Occupational psychologist: Handicapped height Bathroom Accessibility: No   Home Equipment: Grab bars - tub/shower          Prior Functioning/Environment Level  of Independence: Independent                 OT Problem List: Pain;Impaired balance (sitting and/or standing)      OT Treatment/Interventions:      OT Goals(Current goals can be found in the care plan section) Acute Rehab OT Goals Patient Stated Goal: Get back home and recover OT Goal Formulation: With patient Time For Goal Achievement: 11/23/20 Potential to Achieve Goals: Good  OT Frequency:     Barriers to D/C:  none noted          Co-evaluation              AM-PAC OT "6 Clicks" Daily Activity     Outcome Measure Help from another person eating meals?: None Help from another person taking care of personal grooming?: None Help from another person toileting, which includes using toliet, bedpan, or urinal?: None Help from another person bathing (including washing, rinsing, drying)?: None Help from another person to put on and taking off regular upper body clothing?: None Help from another person to put on and taking off regular lower body clothing?: None 6 Click Score: 24   End of Session Nurse Communication: Mobility status  Activity Tolerance: Patient tolerated treatment well Patient left: in bed;with call bell/phone within reach;with family/visitor present  OT Visit Diagnosis: Unsteadiness on feet (R26.81);Pain Pain - Right/Left: Left Pain - part of body: Leg                Time: 1694-5038 OT Time Calculation (min): 16 min Charges:  OT General Charges $OT Visit: 1 Visit OT Evaluation $OT Eval Moderate Complexity: 1 Mod  11/23/2020  Rich, OTR/L  Acute Rehabilitation Services  Office:  [phone]   Metta Clines 11/23/2020, 1:49 PM

## 2020-11-23 NOTE — Evaluation (Signed)
Physical Therapy Evaluation Patient Details Name: Cory [name] MRN: [id number] DOB: [birth year] Today's Date: 11/23/2020   History of Present Illness  Cory [name] is a 75 y.o. male s/p L above-knee to below-knee popliteal/tibioperoneal trunk bypass with ipsilateral reversed left greater saphenous vein, intraoperative arteriogram on 4/19. Pt with recent LLE arteriogram 4/14. PMHx:HTN, Afib, PVD, abdominal aortic endovascular stent graft 08/29/20, R patch angioplasty popliteal and common femoral artery 08/29/20, femoral endarterectomy 08/29/20.  Clinical Impression   Pt presents with moderate LLE post-surgical pain during mobility, impaired gait, decreased activity tolerance vs baseline. Pt to benefit from acute PT to address deficits. Pt ambulated hallway distance requiring close guard and use of RW for steadying and LLE offweighting, improving gait with further distance ambulated. PT expects pt to progress well while acute, no follow up needs anticipated. PT to progress mobility as tolerated, and will continue to follow acutely.      Follow Up Recommendations Follow surgeon's recommendation for DC plan and follow-up therapies;Supervision for mobility/OOB    Equipment Recommendations  None recommended by PT (pt declines RW)    Recommendations for Other Services       Precautions / Restrictions Precautions Precautions: Fall (moderate) Precaution Comments: medial thigh and lower leg incisions Restrictions Weight Bearing Restrictions: No      Mobility  Bed Mobility               General bed mobility comments: up in recliner    Transfers Overall transfer level: Needs assistance Equipment used: None Transfers: Sit to/from Stand Sit to Stand: Supervision         General transfer comment: for safety, increased time to rise and reaching for environment to self-steady upon standing  Ambulation/Gait Ambulation/Gait assistance: Min guard Gait Distance (Feet): 170  Feet Assistive device: Rolling walker (2 wheeled) Gait Pattern/deviations: Step-through pattern;Decreased stride length;Antalgic;Trunk flexed Gait velocity: decr   General Gait Details: min guard for safety, initially ambulatory with IV pole but unsteady and very antalgic gait, improved with use of RW. Verbal cuing for upright posture, placement inside RW.  Stairs            Wheelchair Mobility    Modified Rankin (Stroke Patients Only)       Balance Overall balance assessment: Mild deficits observed, not formally tested                                           Pertinent Vitals/Pain Pain Assessment: 0-10 Pain Score: 3  Pain Location: LLE Pain Descriptors / Indicators: Sore;Discomfort;Grimacing Pain Intervention(s): Limited activity within patient's tolerance;Monitored during session;Repositioned    Home Living Family/patient expects to be discharged to:: Private residence Living Arrangements: Spouse/significant other Available Help at Discharge: Family;Available 24 hours/day Type of Home: House Home Access: Stairs to enter   [employer] of Steps: 1 Home Layout: Two level;Full bath on main level Home Equipment: Grab bars - tub/shower      Prior Function Level of Independence: Independent               Hand Dominance   Dominant Hand: Right    Extremity/Trunk Assessment   Upper Extremity Assessment Upper Extremity Assessment: Defer to OT evaluation    Lower Extremity Assessment Lower Extremity Assessment: RLE deficits/detail RLE Deficits / Details: limited by pain; able to reach terminal knee extension with repeated LAQs. strength WFL, limited by pain  Cervical / Trunk Assessment Cervical / Trunk Assessment: Normal  Communication   Communication: No difficulties  Cognition Arousal/Alertness: Awake/alert Behavior During Therapy: WFL for tasks assessed/performed Overall Cognitive Status: Within Functional Limits for  tasks assessed                                        General Comments      Exercises General Exercises - Lower Extremity Long Arc Quad: AROM;Left;5 reps;Seated   Assessment/Plan    PT Assessment Patient needs continued PT services  PT Problem List Decreased mobility;Pain;Decreased balance;Decreased knowledge of use of DME;Decreased activity tolerance;Decreased range of motion       PT Treatment Interventions DME instruction;Therapeutic activities;Gait training;Therapeutic exercise;Patient/family education;Balance training;Stair training;Functional mobility training;Neuromuscular re-education    PT Goals (Current goals can be found in the Care Plan section)  Acute Rehab PT Goals Patient Stated Goal: decrease pain, return to independence PT Goal Formulation: With patient Time For Goal Achievement: 12/07/20 Potential to Achieve Goals: Good    Frequency Min 3X/week   Barriers to discharge        Co-evaluation               AM-PAC PT "6 Clicks" Mobility  Outcome Measure Help needed turning from your back to your side while in a flat bed without using bedrails?: None Help needed moving from lying on your back to sitting on the side of a flat bed without using bedrails?: None Help needed moving to and from a bed to a chair (including a wheelchair)?: A Little Help needed standing up from a chair using your arms (e.g., wheelchair or bedside chair)?: A Little Help needed to walk in hospital room?: A Little Help needed climbing 3-5 steps with a railing? : A Little 6 Click Score: 20    End of Session   Activity Tolerance: Patient tolerated treatment well Patient left: in chair;with call bell/phone within reach Nurse Communication: Mobility status PT Visit Diagnosis: Other abnormalities of gait and mobility (R26.89);Difficulty in walking, not elsewhere classified (R26.2)    Time: 1610-9604 PT Time Calculation (min) (ACUTE ONLY): 16 min   Charges:    PT Evaluation $PT Eval Low Complexity: 1 Low         Zyrion Coey S, PT DPT Acute Rehabilitation Services Pager [phone]  Office [phone]  Roxine Caddy E Ruffin Pyo 11/23/2020, 10:56 AM

## 2020-11-24 MED ORDER — OXYCODONE-ACETAMINOPHEN 5-325 MG PO TABS: 1.0000 | ORAL_TABLET | ORAL | 0 refills | Status: DC | PRN

## 2020-11-24 NOTE — Progress Notes (Signed)
Mobility Specialist: Progress Note   11/24/20 1135  Mobility  Activity Refused mobility   Pt refused mobility stating he has recently ambulated this morning and is hopeful for discharge soon.   [facility] Marda Breidenbach Mobility Specialist Mobility Specialist Phone: [phone]

## 2020-11-24 NOTE — Discharge Instructions (Signed)
[facility]  Discharge instructions  Lower Extremity Bypass Surgery  Please refer to the following instruction for your post-procedure care. Your surgeon or physician assistant will discuss any changes with you.  Activity  You are encouraged to walk as much as you can. You can slowly return to normal activities during the month after your surgery. Avoid strenuous activity and heavy lifting until your doctor tells you it's OK. Avoid activities such as vacuuming or swinging a golf club. Do not drive until your doctor give the OK and you are no longer taking prescription pain medications. It is also normal to have difficulty with sleep habits, eating and bowel movement after surgery. These will go away with time.  Bathing/Showering  Shower daily after you go home. Do not soak in a bathtub, hot tub, or swim until the incision heals completely.  Incision Care  Clean your incision with mild soap and water. Shower every day. Pat the area dry with a clean towel. You do not need a bandage unless otherwise instructed. Do not apply any ointments or creams to your incision. If you have open wounds you will be instructed how to care for them or a visiting nurse may be arranged for you. If you have staples or sutures along your incision they will be removed at your post-op appointment. You may have skin glue on your incision. Do not peel it off. It will come off on its own in about one week.  Wash the groin wound with soap and water daily and pat dry. (No tub bath-only shower)  Then put a dry gauze or washcloth in the groin to keep this area dry to help prevent wound infection.  Do this daily and as needed.  Do not use Vaseline or neosporin on your incisions.  Only use soap and water on your incisions and then protect and keep dry.  Diet  Resume your normal diet. There are no special food restrictions following this procedure. A low fat/ low cholesterol diet is  recommended for all patients with vascular disease. In order to heal from your surgery, it is CRITICAL to get adequate nutrition. Your body requires vitamins, minerals, and protein. Vegetables are the best source of vitamins and minerals. Vegetables also provide the perfect balance of protein. Processed food has little nutritional value, so try to avoid this.  Medications  Resume your home dose of Eliquis as prescribed starting 11/24/20  Resume taking all your medications unless your doctor or physician assistant tells you not to. If your incision is causing pain, you may take over-the-counter pain relievers such as acetaminophen (Tylenol). If you were prescribed a stronger pain medication, please aware these medication can cause nausea and constipation. Prevent nausea by taking the medication with a snack or meal. Avoid constipation by drinking plenty of fluids and eating foods with high amount of fiber, such as fruits, vegetables, and grains. Take Colace 100 mg (an over-the-counter stool softener) twice a day as needed for constipation.   Do not take Tylenol if you are taking prescription pain medications.  Follow Up  Our office will schedule a follow up appointment 2-3 weeks following discharge.  Please call us immediately for any of the following conditions  .Severe or worsening pain in your legs or feet while at rest or while walking .Increase pain, redness, warmth, or drainage (pus) from your incision site(s) . Fever of 101 degree or higher . The swelling in your leg with the bypass suddenly worsens  and becomes more painful than when you were in the hospital . If you have been instructed to feel your graft pulse then you should do so every day. If you can no longer feel this pulse, call the office immediately. Not all patients are given this instruction. .  Leg swelling is common after leg bypass surgery.  The swelling should improve over a few months following surgery. To improve the  swelling, you may elevate your legs above the level of your heart while you are sitting or resting. Your surgeon or physician assistant may ask you to apply an ACE wrap or wear compression (TED) stockings to help to reduce swelling.  Reduce your risk of vascular disease  Stop smoking. If you would like help call [facility] at 1-800-QUIT-NOW 316-453-3843) or [facility] at [phone].  . Manage your cholesterol . Maintain a desired weight . Control your diabetes weight . Control your diabetes . Keep your blood pressure down .  If you have any questions, please call the office at [phone]

## 2020-11-24 NOTE — Discharge Summary (Signed)
Bypass Discharge Summary Patient ID: Cory [name] [id number] 75 y.o. 10-19-1945  Admit date: 11/22/2020  Discharge date and time: No discharge date for patient encounter.   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 Oneida Alar. Operative report notes Distal anastomosis to the popliteal below-knee artery right at the takeoff of all 3 tibial vessels extending with the toe onto the tibioperoneal trunk. Dominant runoff vessel is the posterior tibial artery, palpable posterior tibial pulse at conclusion of case. Patient tolerated the procedure well and was taken to the recovery room in stable condition.   Patient did well overnight with just some surgical site pain that was manageable with oral pain medications. The left lower extremity bypass patent with palpable PT pulses. Incisions intact and well appearing. PT and OT evaluated patient with no recommended outpatient follow up. Patient tolerated working with mobility specialist. Tolerated diet and voiding without difficulty. Vitals stable.  POD#2 patient continued to do well. Left lower extremity bypass patent with palpable posterior tibial pulse. Pain well managed. Tolerating ambulation. Vitals stable. He remained stable for discharge. He will resume his home dose of Xarelto today. He will continue his other home medications as prescribed. PDMP was  reviewed and pain medication was sent to patients pharmacy. He will have follow up in 2-3 weeks   Consults: None  Treatments: surgery: Left above-knee to below-knee popliteal/tibioperoneal trunk bypass with ipsilateral reversed left greater saphenous vein, intraoperative arteriogram 11/22/20   Disposition: Discharge disposition: 01-Home or Self Care       - For [facility] use ---  Post-op:  Wound infection: No  Graft infection: No  Transfusion: No  If yes, 0 units given New Arrhythmia: No Patency judged by: [ ]  Dopper only, [ ]  Palpable graft pulse, [ ]  Palpable distal pulse, [ ]  ABI inc. > 0.15, [ ]  Duplex D/C Ambulatory Status: Ambulatory  Complications: MI: [ X] No, [ ]  Troponin only, [ ]  EKG or Clinical CHF: No Resp failure: Valu.Nieves ] none, [ ]  Pneumonia, [ ]  Ventilator Chg in renal function: [ X] none, [ ]  Inc. Cr > 0.5, [ ]  Temp. Dialysis, [ ]  Permanent dialysis Stroke: Valu.Nieves ] None, [ ]  Minor, [ ]  Major Return to OR: No  Reason for return to OR: [ ]  Bleeding, [ ]  Infection, [ ]  Thrombosis, [ ]  Revision  Discharge medications: Statin use:  Yes ASA use:  No  for medical reason high bleed risk Plavix use:  No  for medical reason high bleed risk, on Xarelto Beta blocker use: Yes Coumadin use: No  for medical reason high bleed risk, on xarelto    Patient Instructions:  Allergies as of 11/24/2020      Reactions   Azithromycin Diarrhea   2,4-d Dimethylamine (amisol) Other (See Comments), Itching      Medication List    STOP taking these medications   ciprofloxacin 500 MG tablet Commonly known as: CIPRO     TAKE these medications   acetaminophen 500 MG tablet Commonly known as:  TYLENOL Take 500 mg by mouth every 6 (six) hours as needed (for pain.).   cetirizine 10 MG tablet Commonly known as: ZYRTEC Take 1 tablet (10 mg total) by mouth daily. What changed:   when to take this  reasons to take this   cholecalciferol 25 MCG (1000 UNIT) tablet Commonly known  as: VITAMIN D Take 1,000 Units by mouth 2 (two) times daily.   diltiazem 120 MG 24 hr capsule Commonly known as: TIAZAC Take 1 capsule by mouth once daily   fluticasone 50 MCG/ACT nasal spray Commonly known as: FLONASE Place 2 sprays into both nostrils daily. What changed:   when to take this  reasons to take this   metoprolol tartrate 25 MG tablet Commonly known as: LOPRESSOR Take 1 tablet by mouth twice daily as needed What changed: reasons to take this   oxyCODONE-acetaminophen 5-325 MG tablet Commonly known as: PERCOCET/ROXICET Take 1 tablet by mouth every 4 (four) hours as needed for moderate pain.   rivaroxaban 20 MG Tabs tablet Commonly known as: Xarelto Take 1 tablet (20 mg total) by mouth daily with supper.   rosuvastatin 10 MG tablet Commonly known as: CRESTOR Take 10 mg by mouth daily.            Discharge Care Instructions  (From admission, onward)         Start     Ordered   11/24/20 0000  Discharge wound care:       Comments: Keep incisions clean and dry. Wash daily with mild soap and water. Pat dry. Do not soak in tub   11/24/20 0820         Activity: activity as tolerated, no lifting, driving, or strenuous exercise for 2 weeks and no driving while on analgesics Diet: low fat, low cholesterol diet Wound Care: keep wound clean and dry and Clean wound daily with soap and water, pat dry  Follow-up with Dr. Oneida Alar in 2-3 weeks.  Signed: Karoline Caldwell 11/24/2020 8:21 AM

## 2020-11-24 NOTE — Progress Notes (Signed)
[facility] [place]  Subjective  - less pain   Objective 113/70 65 98.4 F (36.9 C) (Oral) 17 94%  Intake/Output Summary (Last 24 hours) at 11/24/2020 0757 Last data filed at 11/23/2020 2329 Gross per 24 hour  Intake 1077.12 ml  Output 450 ml  Net 627.12 ml   Edema in left ankle but vaguely palpable PT Foot pink warm Able to walk with out pain Some incisional ecchymosis at posterior portion of thigh incisions  Assessment/Planning: POD #2 s/p above to below knee popliteal bypass D/c home today follow up in a few weeks Resume home xarelto dose on d/c  [name] 11/24/2020 7:57 AM --  Laboratory Lab Results: Recent Labs    11/22/20 1240 11/23/20 0058  WBC 5.5 8.8  HGB 13.9 12.2*  HCT 44.8 37.9*  PLT 191 202   BMET Recent Labs    11/22/20 1240 11/23/20 0058  NA 138 134*  K 4.0 4.2  CL 107 104  CO2 24 26  GLUCOSE 91 155*  BUN 14 10  CREATININE 0.95 0.91  CALCIUM 8.5* 8.1*    COAG Lab Results  Component Value Date   INR 1.0 11/22/2020   INR 1.2 08/29/2020   INR 1.3 (H) 08/29/2020   No results found for: PTT

## 2020-11-24 NOTE — Progress Notes (Signed)
Patient given discharge instructions. Telemetry box removed, CCMD notified. PIV removed. Personal belongings taken to vehicle with patient in wheelchair.  Daymon Larsen, RN

## 2020-11-24 NOTE — Progress Notes (Signed)
Physical Therapy Treatment Patient Details Name: QUATAVIOUS ROSSA MRN: [id number] DOB: 1945-09-30 Today's Date: 11/24/2020    History of Present Illness KEMARI NAREZ is a 75 y.o. male s/p L above-knee to below-knee popliteal/tibioperoneal trunk bypass with ipsilateral reversed left greater saphenous vein, intraoperative arteriogram on 4/19. Pt with recent LLE arteriogram 4/14. PMH: HTN, Afib, PVD, abdominal aortic endovascular stent graft 08/29/20, R patch angioplasty popliteal and common femoral artery 08/29/20, femoral endarterectomy 08/29/20.    PT Comments    Pt received in supine, agreeable to therapy session in room (planning to DC soon and pt defers hallway ambulation) and with good participation and tolerance for transfer, stairs and exercise training. Pt performed steps and transfers without physical assist needed and no LOB. Pt deferring to use RW for ambulation within room but recommended he consider it for pain reduction for first few days post-op due to pain/increased fall risk with antalgic and slow gait <0.4 m/s. Pt given HEP handout ([email or web address] Access Code: [email or web address]) and encouraged him to perform BID/TID as able for strengthening and activity progression. Pt continues to benefit from PT services to progress toward functional mobility goals. Anticipate pt safe to DC home with PRN supervision/assist once medically cleared.  Follow Up Recommendations  Follow surgeon's recommendation for DC plan and follow-up therapies;Supervision for mobility/OOB     Equipment Recommendations  None recommended by PT;Other (comment) (rec RW but pt may defer)    Recommendations for Other Services       Precautions / Restrictions Precautions Precautions: None Precaution Comments: medial thigh and lower leg incisions Restrictions Weight Bearing Restrictions: No    Mobility  Bed Mobility Overal bed mobility: Independent                  Transfers Overall transfer level:  Modified independent               General transfer comment: able to perform unassisted but did give him cues afterward to stagger feet for improved pain control  Ambulation/Gait Ambulation/Gait assistance: Supervision Gait Distance (Feet): 30 Feet Assistive device: None Gait Pattern/deviations: Step-through pattern;Decreased stride length;Antalgic;Trunk flexed Gait velocity: decr   General Gait Details: cues for using RW but pt defers for in-room ambulation, encouraged him to utilize it until pain improves; no overt LOB but somewhat antalgic gait   Stairs Stairs: Yes Stairs assistance: Supervision Stair Management: One rail Right;Step to pattern;Forwards Number of Stairs: 3 General stair comments: pt ascended/descended single 7" step x3 trials with good carryover of instruction for sequencing and minimal c/o pain, no LOB or buckling. reports no concerns, mostly for strengthening.   Wheelchair Mobility    Modified Rankin (Stroke Patients Only)       Balance Overall balance assessment: Mild deficits observed, not formally tested                                          Cognition Arousal/Alertness: Awake/alert Behavior During Therapy: WFL for tasks assessed/performed Overall Cognitive Status: Within Functional Limits for tasks assessed                                 General Comments: receptive, cooperative      Exercises General Exercises - Lower Extremity Long Arc Quad: AROM;Left;10 reps;Seated Other Exercises Other Exercises: pt performed STS x 3 reps for BLE strengthening  and serial STS exercise put on HEP list Other Exercises: verbal/visual review and instruction with HEP handout: supine SLR, Hip abd, ankle pumps; standing hamstring curls and serial STS Other Exercises: 7" Step-ups x3 reps for strengthening and technique    General Comments General comments (skin integrity, edema, etc.): BP 118/70 prior to mobility and no  dizziness reported, RR 20 rpm, HR 75-83 bpm, SpO2 WNL      Pertinent Vitals/Pain Pain Assessment: Faces Faces Pain Scale: Hurts a little bit Pain Location: LLE more in anterior tibial area Pain Descriptors / Indicators: Sore;Discomfort Pain Intervention(s): Monitored during session;Repositioned (pt deferring ice pack for soreness)    Home Living                      Prior Function            PT Goals (current goals can now be found in the care plan section) Acute Rehab PT Goals Patient Stated Goal: Get back home and recover PT Goal Formulation: With patient Time For Goal Achievement: 12/07/20 Potential to Achieve Goals: Good Progress towards PT goals: Progressing toward goals    Frequency    Min 3X/week      PT Plan Current plan remains appropriate    Co-evaluation              AM-PAC PT "6 Clicks" Mobility   Outcome Measure  Help needed turning from your back to your side while in a flat bed without using bedrails?: None Help needed moving from lying on your back to sitting on the side of a flat bed without using bedrails?: None Help needed moving to and from a bed to a chair (including a wheelchair)?: A Little Help needed standing up from a chair using your arms (e.g., wheelchair or bedside chair)?: None Help needed to walk in hospital room?: A Little Help needed climbing 3-5 steps with a railing? : A Little 6 Click Score: 21    End of Session   Activity Tolerance: Patient tolerated treatment well Patient left: in chair;with call bell/phone within reach Nurse Communication: Mobility status PT Visit Diagnosis: Other abnormalities of gait and mobility (R26.89);Difficulty in walking, not elsewhere classified (R26.2)     Time: 7711-6579 PT Time Calculation (min) (ACUTE ONLY): 13 min  Charges:  $Therapeutic Exercise: 8-22 mins                     Zulay Corrie P., PTA Acute Rehabilitation Services Pager: [phone] Office: [place] 11/24/2020, 10:29 AM

## 2020-11-25 ENCOUNTER — Telehealth: Payer: Self-pay

## 2020-11-25 NOTE — Telephone Encounter (Signed)
Transition Care Management Unsuccessful Follow-up Telephone Call  Date of discharge and from where:  11/24/2020 [facility]  Attempts:  1st Attempt  Reason for unsuccessful TCM follow-up call:  Left voice message

## 2020-12-01 ENCOUNTER — Telehealth: Payer: Self-pay

## 2020-12-01 ENCOUNTER — Other Ambulatory Visit: Payer: Self-pay | Admitting: Physician Assistant

## 2020-12-01 DIAGNOSIS — I739 Peripheral vascular disease, unspecified: Secondary | ICD-10-CM

## 2020-12-01 MED ORDER — OXYCODONE-ACETAMINOPHEN 5-325 MG PO TABS: 1.0000 | ORAL_TABLET | ORAL | 0 refills | Status: DC | PRN

## 2020-12-01 NOTE — Telephone Encounter (Signed)
Patient called to report swelling in his left leg s/p fem tib bypass. He is having a stabbing 7/10 pain in his ankle when he puts weight on it or walks. Does not bother him at rest. Does not have fever, chills, redness, temperature change and denies any other problems. Advised patient that swelling was normal and to elevate the leg. PA sent in another RX of pain medicine. He knows to call back before Thursday's follow up if he has further issues.

## 2020-12-07 NOTE — Progress Notes (Signed)
POST OPERATIVE OFFICE NOTE    CC:  F/u for surgery  HPI:  This is a 75 y.o. 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  He also had coil embolization of a 3.3 cm right internal iliac aneurysm.  This was complicated by a hematoma in his right groin after his coil embolization.  At the time of his stent graft repair he also required right common femoral endarterectomy and popliteal and tibial embolectomy on the right side.  When he was seen 11/22/2020, he had pretty much recovered from all of this but did still have some edema in the right leg.  It had significantly improved but was still ~ 25% larger than the left.  His original presenting symptom was claudication in the left leg.  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  He now feels like he is well enough to consider repair of this.  I reviewed his CT angiogram from November 2021 which shows occlusion of the popliteal artery and what appears to be two-vessel runoff.  However, this CT scan is quite dated and has had multiple interventions since then.  He does still have claudication in the left leg occurring at about 1 block.  He does not have rest pain.  Other chronic medical problems include aortic stenosis and paroxysmal atrial fibrillation.  He is on Xarelto for this.  On 11/22/2020, he underwent Left above-knee to below-knee popliteal/tibioperoneal trunk bypass with ipsilateral reversed left greater saphenous vein, intraoperative arteriogram by Dr. Oneida Alar.  He was discharged home on POD 2.    Pt returns today for follow up.  Pt states he has had some swelling in his leg since surgery.  He states he doesn't have any pain in his left foot.  He does have some numbness, tingling, pain on medial side of the left leg above the ankle.  He has a hematoma at the proximal vein harvest incision.  He inquires about wearing  compression.  Allergies  Allergen Reactions  . Azithromycin Diarrhea  . 2,4-D Dimethylamine (Amisol) Other (See Comments) and Itching    Current Outpatient Medications  Medication Sig Dispense Refill  . acetaminophen (TYLENOL) 500 MG tablet Take 500 mg by mouth every 6 (six) hours as needed (for pain.).    Marland Kitchen cetirizine (ZYRTEC) 10 MG tablet Take 1 tablet (10 mg total) by mouth daily. (Patient taking differently: Take 10 mg by mouth daily as needed for allergies.) 30 tablet 11  . cholecalciferol (VITAMIN D) 25 MCG (1000 UNIT) tablet Take 1,000 Units by mouth 2 (two) times daily.    Marland Kitchen diltiazem (TIAZAC) 120 MG 24 hr capsule Take 1 capsule by mouth once daily (Patient taking differently: Take 120 mg by mouth daily.) 90 capsule 1  . fluticasone (FLONASE) 50 MCG/ACT nasal spray Place 2 sprays into both nostrils daily. (Patient taking differently: Place 2 sprays into both nostrils daily as needed for allergies.) 16 g 6  . metoprolol tartrate (LOPRESSOR) 25 MG tablet Take 1 tablet by mouth twice daily as needed (Patient taking differently: Take 25 mg by mouth 2 (two) times daily as needed (afib).) 30 tablet 0  . oxyCODONE-acetaminophen (PERCOCET/ROXICET) 5-325 MG tablet Take 1 tablet by mouth every 4 (four) hours as needed for moderate pain. 30 tablet 0  . rivaroxaban (XARELTO) 20 MG TABS tablet Take 1 tablet (20 mg total) by mouth daily with supper. (Patient taking  differently: Take 20 mg by mouth daily with supper.) 30 tablet 11  . rosuvastatin (CRESTOR) 10 MG tablet Take 10 mg by mouth daily.     No current facility-administered medications for this visit.     ROS:  See HPI  Physical Exam:  Today's Vitals   12/08/20 1103  BP: 130/72  Pulse: 75  Resp: 20  Temp: 98.4 F (36.9 C)  TempSrc: Temporal  SpO2: 96%  Weight: 233 lb 12.8 oz (106.1 kg)  Height: 6\' 4"  (1.93 m)  PainSc: 6   PainLoc: Leg   Body mass index is 28.46 kg/m.   Incision:  All incisions are healing  nicely. Extremities:  Brisk biphasic doppler signals left DP/PT/peroneal; 2-3+ swelling LLE     Assessment/Plan:  This is a 75 y.o. 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  He also had coil embolization of a 3.3 cm right internal iliac aneurysm.  This was complicated by a hematoma in his right groin after his coil embolization.  At the time of his stent graft repair he also required right common femoral endarterectomy and popliteal and tibial embolectomy on the right side January 2022 by Dr. Oneida Alar and subsequently underwent Left above-knee to below-knee popliteal/tibioperoneal trunk bypass with ipsilateral reversed left greater saphenous vein, intraoperative arteriogram on 11/22/2020 also by Dr. Oneida Alar  -pt with brisk biphasic doppler signals left DP/PT/peroneal.  He does have some pain/tingling/numbness on the medial aspect of the left leg above the ankle and is most likely due to nerve irritation and should improve over time.   -pt with swelling of LLE, which is not unexpected after vein harvest for bypass.  Dr. Oneida Alar discussed with pt that he can wear compression.  He is measured today and getting a pair of 15-14mmHg knee high to ensure it fits.  Discussed with him putting it on in the morning before putting his foot on the floor and taking it off at night.  Also instructed pt elevate his legs at the end of the day with back flat and knees slightly bent with knees above groin and ankles above knees, which puts feet above the heart.  Doubt DVT given vein harvest and pt on anticoagulation.  He is already on Xarelto, which would be treatment for DVT-given this, will not get u/s as it would not change treatment.  -he will f/u in one month for incision/leg swelling check.  -he will f/u in 3 months with ABI and LLE arterial duplex. (follow up on a Thursday, which is Dr. Oneida Alar clinic day)  -he will need to f/u in 9  months with EVAR duplex  -pt will call sooner if he has any issues.   Leontine Locket, [facility] [phone]   Clinic MD:  Oneida Alar

## 2020-12-08 ENCOUNTER — Other Ambulatory Visit: Payer: Self-pay

## 2020-12-08 ENCOUNTER — Ambulatory Visit (INDEPENDENT_AMBULATORY_CARE_PROVIDER_SITE_OTHER): Payer: Medicare HMO | Admitting: Physician Assistant

## 2020-12-08 VITALS — BP 130/72 | HR 75 | Temp 98.4°F | Resp 20 | Ht 76.0 in | Wt 233.8 lb

## 2020-12-08 DIAGNOSIS — M7989 Other specified soft tissue disorders: Secondary | ICD-10-CM

## 2020-12-08 DIAGNOSIS — I724 Aneurysm of artery of lower extremity: Secondary | ICD-10-CM

## 2020-12-09 ENCOUNTER — Other Ambulatory Visit: Payer: Self-pay

## 2020-12-09 DIAGNOSIS — I739 Peripheral vascular disease, unspecified: Secondary | ICD-10-CM

## 2020-12-16 ENCOUNTER — Telehealth: Payer: Self-pay | Admitting: *Deleted

## 2020-12-16 NOTE — Telephone Encounter (Signed)
Pt called to discuss new red spot on lower leg incision. He reports wearing compression stockings and thinks they may be rubbing that spot. Incisions are closed and the red spot is slightly "crusty". Advised pt to pad the area when wearing stockings and see if the red spot improves. He is to call us back if there is no improvement. He also inquired about pain management, especially when he lays down to go to sleep. Told pt he can take tylenol. Advised pt on how to take tylenol safely. Pt verbalized understanding.

## 2020-12-20 ENCOUNTER — Telehealth: Payer: Self-pay | Admitting: Family Medicine

## 2020-12-20 NOTE — Telephone Encounter (Signed)
Left message for patient to call back and schedule Medicare Annual Wellness Visit (AWV) either virtually or in office.   Last AWV 06/05/17  please schedule at anytime with [facility] Nurse Health Advisor 1 or 2   This should be a 45 minute visit.  

## 2020-12-26 ENCOUNTER — Telehealth: Payer: Self-pay | Admitting: *Deleted

## 2020-12-26 NOTE — Telephone Encounter (Signed)
Patient called stating over the weekend his lower surgical incision on his left leg had drained some clear then pinkish fluid.  Denies odor or warmth.  The drainage subsided yesterday morning.  I instructed patient to wash incisions with antibacterial soap, elevate his leg when resting and walking when possible.  Patient is to notify office if symptoms should worsen.  He voiced understanding of the instructions.

## 2021-01-12 ENCOUNTER — Ambulatory Visit (INDEPENDENT_AMBULATORY_CARE_PROVIDER_SITE_OTHER): Payer: Medicare HMO | Admitting: Physician Assistant

## 2021-01-12 ENCOUNTER — Ambulatory Visit (HOSPITAL_COMMUNITY)
Admission: RE | Admit: 2021-01-12 | Discharge: 2021-01-12 | Disposition: A | Discharge status: Home / Self Care | Payer: Medicare HMO | Source: Ambulatory Visit | Attending: Vascular Surgery | Admitting: Vascular Surgery

## 2021-01-12 ENCOUNTER — Other Ambulatory Visit (HOSPITAL_COMMUNITY): Payer: Self-pay | Admitting: Physician Assistant

## 2021-01-12 ENCOUNTER — Other Ambulatory Visit: Payer: Self-pay

## 2021-01-12 VITALS — BP 121/71 | HR 72 | Temp 97.9°F | Resp 20 | Ht 76.0 in | Wt 230.8 lb

## 2021-01-12 DIAGNOSIS — M7989 Other specified soft tissue disorders: Secondary | ICD-10-CM

## 2021-01-12 DIAGNOSIS — I739 Peripheral vascular disease, unspecified: Secondary | ICD-10-CM

## 2021-01-12 NOTE — Progress Notes (Signed)
    Postoperative Visit   History of Present Illness   Cory [name] is a 75 y.o. year old male who presents for postoperative follow-up for left above-the-knee to below the knee popliteal artery bypass with vein due to a thrombosed popliteal aneurysm by Dr. Oneida Alar on 11/22/2020.  He denies any open wounds or rest pain in his left foot.  He was seen immediately postoperatively due to left lower extremity edema.  He believes the edema has slightly improved with knee-high compression stockings.  He has not made much effort to elevate his legs during the day.  He believes incisions are all healed.  Surgical history also significant for EVAR in January 2022 involving cutdown to repair right common femoral artery as well as right popliteal and tibial embolectomy with patch angioplasty of below the knee popliteal artery.   For VQI Use Only   PRE-ADM LIVING: Home  AMB STATUS: Ambulatory   Physical Examination   Vitals:   01/12/21 1042  BP: 121/71  Pulse: 72  Resp: 20  Temp: 97.9 F (36.6 C)  TempSrc: Temporal  SpO2: 96%  Weight: 230 lb 12.8 oz (104.7 kg)  Height: 6\' 4"  (1.93 m)    LLE: Incisions are healed, pitting edema to the mid thigh; palpable left PT pulse   Venous duplex negative for DVT LLE   Medical Decision Making   Cory [name] is a 75 y.o. year old male who presents s/p left above-knee to below-knee popliteal artery bypass due to occluded popliteal artery aneurysm.  Left foot is well-perfused with palpable PT pulse Given patient had pitting edema to the level of the mid to distal thigh we checked a venous duplex study which was negative for DVT.  Encourage patient to wear knee-high compression stockings daily.  We also discussed proper elevation of his legs periodically during the day All incisions of left lower extremity well-healed Patient has a follow-up at the end of August for formal bypass surveillance; he should also be in the system for EVAR duplex in the  fall   Dagoberto Ligas PA-C [facility] [place] Office: [facility] MD: Oneida Alar

## 2021-02-03 DIAGNOSIS — C44612 Basal cell carcinoma of skin of right upper limb, including shoulder: Secondary | ICD-10-CM | POA: Diagnosis not present

## 2021-02-03 DIAGNOSIS — Z85828 Personal history of other malignant neoplasm of skin: Secondary | ICD-10-CM | POA: Diagnosis not present

## 2021-02-03 DIAGNOSIS — L905 Scar conditions and fibrosis of skin: Secondary | ICD-10-CM | POA: Diagnosis not present

## 2021-02-03 DIAGNOSIS — D485 Neoplasm of uncertain behavior of skin: Secondary | ICD-10-CM | POA: Diagnosis not present

## 2021-02-03 DIAGNOSIS — C44319 Basal cell carcinoma of skin of other parts of face: Secondary | ICD-10-CM | POA: Diagnosis not present

## 2021-02-07 ENCOUNTER — Telehealth: Payer: Self-pay | Admitting: Family Medicine

## 2021-02-07 NOTE — Telephone Encounter (Signed)
Left message for patient to call back and schedule Medicare Annual Wellness Visit (AWV) either virtually or in office.   Last AWV 06/05/17  please schedule at anytime with [facility] Nurse Health Advisor 1 or 2   This should be a 45 minute visit.

## 2021-03-01 ENCOUNTER — Telehealth (INDEPENDENT_AMBULATORY_CARE_PROVIDER_SITE_OTHER): Payer: Medicare HMO | Admitting: Family Medicine

## 2021-03-01 VITALS — BP 130/80 | Ht 76.0 in | Wt 225.0 lb

## 2021-03-01 DIAGNOSIS — J029 Acute pharyngitis, unspecified: Secondary | ICD-10-CM

## 2021-03-01 DIAGNOSIS — H9202 Otalgia, left ear: Secondary | ICD-10-CM

## 2021-03-01 NOTE — Progress Notes (Signed)
Patient ID: Cory [name], male   DOB: November 22, 1945, 75 y.o.   MRN: [id number]  This visit type was conducted due to national recommendations for restrictions regarding the COVID-19 pandemic in an effort to limit this patient's exposure and mitigate transmission in our community.   Virtual Visit via Video Note  I connected with [name] on 03/01/21 at  9:45 AM EDT by a video enabled telemedicine application and verified that I am speaking with the correct person using two identifiers.  Location patient: home Location provider:work or home office Persons participating in the virtual visit: patient, provider  I discussed the limitations of evaluation and management by telemedicine and the availability of in person appointments. The patient expressed understanding and agreed to proceed.   HPI:  Mr. Chesebro states he was diagnosed with COVID back around 1 July.  His symptoms are relatively mild.  He had some congestion and fatigue.  Never had any fever.  Seem to be improving but he has had some sore throat symptoms intermittently since then with left earache as well.  He has not noted any adenopathy.  Good appetite.  No weight loss.  He has noted that acidic foods such as tomatoes seem to worsen his symptoms.  Denies any active reflux.  No significant cough.  No dysphagia.  Does have some mild postnasal drip.  Long history of smoking  Has peripheral vascular disease and has had multiple vascular procedures this past year.  ROS: See pertinent positives and negatives per HPI.  Past Medical History:  Diagnosis Date   Aortic stenosis    a. Moderate by echo 05/2020.   Arthritis    right hip   Cancer ([place])    skin cancer nose, right left, upper left basel cell   Dysrhythmia    a-fib   PAF (paroxysmal atrial fibrillation) (HCC)    Peripheral arterial disease (HCC)    Tobacco abuse     Past Surgical History:  Procedure Laterality Date   ABDOMINAL AORTIC ENDOVASCULAR STENT GRAFT N/A 08/29/2020    Procedure: ABDOMINAL AORTIC ENDOVASCULAR STENT GRAFT;  Surgeon: Elam Dutch, MD;  Location: [place];  Service: Vascular;  Laterality: N/A;   ABDOMINAL AORTOGRAM W/LOWER EXTREMITY Bilateral 06/24/2020   Procedure: ABDOMINAL AORTOGRAM W/LOWER EXTREMITY;  Surgeon: Elam Dutch, MD;  Location: [place] [facility];  Service: Cardiovascular;  Laterality: Bilateral;   BACK SURGERY     2005   CERVICAL SPINE SURGERY     EMBOLECTOMY Right 08/29/2020   Procedure: POPLITEAL AND TIBIAL EMBOLECTOMY;  Surgeon: Elam Dutch, MD;  Location: [place];  Service: Vascular;  Laterality: Right;   EMBOLIZATION Right 06/24/2020   Procedure: EMBOLIZATION;  Surgeon: Elam Dutch, MD;  Location: [place] [facility];  Service: Cardiovascular;  Laterality: Right;   ENDARTERECTOMY FEMORAL Right 08/29/2020   Procedure: ENDARTERECTOMY FEMORAL;  Surgeon: Elam Dutch, MD;  Location: [place];  Service: Vascular;  Laterality: Right;   EYE SURGERY     FEMORAL ARTERY EXPLORATION Right 06/25/2020   Procedure: RIGHT GROIN EXPLORATION Evacuation of Hematoma  WITH REPAIR OF RIGHT Common FEMORAL ARTERY.;  Surgeon: Angelia Mould, MD;  Location: [place];  Service: Vascular;  Laterality: Right;   FEMORAL-POPLITEAL BYPASS GRAFT Left 11/22/2020   Procedure: LEFT ABOVE KNEE-BELOW KNEE BYPASS TIBIAL PERITONEAL TRUNK REVERSE IPSILATERAL GREATER SAPHENOUS VEIN;  Surgeon: Elam Dutch, MD;  Location: [place];  Service: Vascular;  Laterality: Left;   LEG SURGERY     Trauma  LOWER EXTREMITY ANGIOGRAPHY Left 11/17/2020   Procedure: Lower Extremity Angiography;  Surgeon: Marty Heck, MD;  Location: [place] [facility];  Service: Cardiovascular;  Laterality: Left;   PATCH ANGIOPLASTY Right 08/29/2020   Procedure: PATCH ANGIOPLASTY RIGHT BELOW KNEE POPLITEAL ARTERY AND RIGHT COMMON FEMORAL ARTERY;  Surgeon: Elam Dutch, MD;  Location: [facility];  Service: Vascular;  Laterality: Right;   RETINAL DETACHMENT SURGERY      ULTRASOUND GUIDANCE FOR VASCULAR ACCESS Bilateral 08/29/2020   Procedure: ULTRASOUND GUIDANCE FOR VASCULAR ACCESS;  Surgeon: Elam Dutch, MD;  Location[facility];  Service: Vascular;  Laterality: Bilateral;    Family History  Problem Relation Age of Onset   Ovarian cancer Mother    Alcoholism Father    Breast cancer Sister    Ovarian cancer Sister     SOCIAL HX: Long history of smoking   Current Outpatient Medications:    acetaminophen (TYLENOL) 500 MG tablet, Take 500 mg by mouth every 6 (six) hours as needed (for pain.)., Disp: , Rfl:    cetirizine (ZYRTEC) 10 MG tablet, Take 1 tablet (10 mg total) by mouth daily. (Patient taking differently: Take 10 mg by mouth daily as needed for allergies.), Disp: 30 tablet, Rfl: 11   cholecalciferol (VITAMIN D) 25 MCG (1000 UNIT) tablet, Take 1,000 Units by mouth 2 (two) times daily., Disp: , Rfl:    diltiazem (TIAZAC) 120 MG 24 hr capsule, Take 1 capsule by mouth once daily (Patient taking differently: Take 120 mg by mouth daily.), Disp: 90 capsule, Rfl: 1   fluticasone (FLONASE) 50 MCG/ACT nasal spray, Place 2 sprays into both nostrils daily. (Patient taking differently: Place 2 sprays into both nostrils daily as needed for allergies.), Disp: 16 g, Rfl: 6   metoprolol tartrate (LOPRESSOR) 25 MG tablet, Take 1 tablet by mouth twice daily as needed (Patient taking differently: Take 25 mg by mouth 2 (two) times daily as needed (afib).), Disp: 30 tablet, Rfl: 0   rivaroxaban (XARELTO) 20 MG TABS tablet, Take 1 tablet (20 mg total) by mouth daily with supper. (Patient taking differently: Take 20 mg by mouth daily with supper.), Disp: 30 tablet, Rfl: 11   rosuvastatin (CRESTOR) 10 MG tablet, Take 10 mg by mouth daily., Disp: , Rfl:   EXAM:  VITALS per patient if applicable:  GENERAL: alert, oriented, appears well and in no acute distress  HEENT: atraumatic, conjunttiva clear, no obvious abnormalities on inspection of external nose and  ears  NECK: normal movements of the head and neck  LUNGS: on inspection no signs of respiratory distress, breathing rate appears normal, no obvious gross SOB, gasping or wheezing  CV: no obvious cyanosis  MS: moves all visible extremities without noticeable abnormality  PSYCH/NEURO: pleasant and cooperative, no obvious depression or anxiety, speech and thought processing grossly intact  ASSESSMENT AND PLAN:  Discussed the following assessment and plan:  Patient relates persistent sore throat symptoms and left earache really now for several weeks.  He does not describe any red flags such as weight loss or loss of appetite or any unilateral adenopathy.  He does report having COVID back around 1 July of his symptoms are relatively mild  -Given duration of symptoms we recommended in office follow-up to further assess his throat and ear -Continue over-the-counter antihistamines other than Benadryl as needed for postnasal drip symptoms.  20 minutes were spent discussing his symptoms and catching up on his recent past medical history and discussing conservative measures to try for his  current symptoms   I discussed the assessment and treatment plan with the patient. The patient was provided an opportunity to ask questions and all were answered. The patient agreed with the plan and demonstrated an understanding of the instructions.   The patient was advised to call back or seek an in-person evaluation if the symptoms worsen or if the condition fails to improve as anticipated.     Carolann Littler, MD

## 2021-03-01 NOTE — Progress Notes (Signed)
Pre visit review using our clinic review tool, if applicable. No additional management support is needed unless otherwise documented below in the visit note. 

## 2021-03-07 ENCOUNTER — Other Ambulatory Visit: Payer: Self-pay

## 2021-03-08 ENCOUNTER — Encounter: Payer: Self-pay | Admitting: Family Medicine

## 2021-03-08 ENCOUNTER — Ambulatory Visit (INDEPENDENT_AMBULATORY_CARE_PROVIDER_SITE_OTHER): Payer: Medicare HMO | Admitting: Family Medicine

## 2021-03-08 VITALS — BP 134/80 | HR 75 | Temp 97.6°F | Wt 225.1 lb

## 2021-03-08 DIAGNOSIS — I10 Essential (primary) hypertension: Secondary | ICD-10-CM | POA: Diagnosis not present

## 2021-03-08 DIAGNOSIS — H9202 Otalgia, left ear: Secondary | ICD-10-CM

## 2021-03-08 DIAGNOSIS — J029 Acute pharyngitis, unspecified: Secondary | ICD-10-CM | POA: Diagnosis not present

## 2021-03-08 DIAGNOSIS — I739 Peripheral vascular disease, unspecified: Secondary | ICD-10-CM | POA: Diagnosis not present

## 2021-03-08 DIAGNOSIS — Z720 Tobacco use: Secondary | ICD-10-CM

## 2021-03-08 NOTE — Progress Notes (Signed)
Established Patient Office Visit  Subjective:  Patient ID: Cory [name], male    DOB: 1945-08-31  Age: 75 y.o. MRN: [id number]  CC:  Chief Complaint  Patient presents with   Sore Throat    X 3 weeks, sore throat and left ear pain    HPI YER BERENDES presents for medical follow-up and also for complaint of intermittent sore throat and left earache past 3 weeks.  Symptoms are actually improved today.  We saw him last fall he had claudication symptoms left lower extremity and studies also revealed abdominal aortic aneurysm and right common iliac artery aneurysm.  He underwent Cole embolization of the right internal iliac artery complicated by hematoma of the groin requiring operative repair.  On April 19 of this year he underwent left above-knee to below-knee popliteal/tibioperoneal trunk bypass and has done reasonably well since that surgery.  Does have some left lower extremity edema since the surgery claudication symptoms much improved.  Can walk much further distances at this time without difficulty.  Regarding the sore throat and ear symptoms these have been intermittent for 3 weeks.  Denies any nasal congestion.  No fever.  No adenopathy.  We had recommended office evaluation because of duration.  Symptoms actually improved today.  He has peripheral vascular disease and we started Crestor last year and last lipids were about 8 months ago and much improved.  LDL cholesterol 50.  Blood pressure up today but consistently well controlled at home and also well controlled on multiple visits to vascular surgery recently.  He remains on diltiazem and metoprolol.  Also takes Xarelto regularly.  He has history of transient atrial fibrillation.  No recent chest pains.  Unfortunately, still smoking half pack cigarettes per day.  Past Medical History:  Diagnosis Date   Aortic stenosis    a. Moderate by echo 05/2020.   Arthritis    right hip   Cancer ([place])    skin cancer nose, right left, upper  left basel cell   Dysrhythmia    a-fib   PAF (paroxysmal atrial fibrillation) (HCC)    Peripheral arterial disease (HCC)    Tobacco abuse     Past Surgical History:  Procedure Laterality Date   ABDOMINAL AORTIC ENDOVASCULAR STENT GRAFT N/A 08/29/2020   Procedure: ABDOMINAL AORTIC ENDOVASCULAR STENT GRAFT;  Surgeon: Elam Dutch, MD;  Location: [place];  Service: Vascular;  Laterality: N/A;   ABDOMINAL AORTOGRAM W/LOWER EXTREMITY Bilateral 06/24/2020   Procedure: ABDOMINAL AORTOGRAM W/LOWER EXTREMITY;  Surgeon: Elam Dutch, MD;  Location: [place] [facility];  Service: Cardiovascular;  Laterality: Bilateral;   BACK SURGERY     2005   CERVICAL SPINE SURGERY     EMBOLECTOMY Right 08/29/2020   Procedure: POPLITEAL AND TIBIAL EMBOLECTOMY;  Surgeon: Elam Dutch, MD;  Location: [place];  Service: Vascular;  Laterality: Right;   EMBOLIZATION Right 06/24/2020   Procedure: EMBOLIZATION;  Surgeon: Elam Dutch, MD;  Location: [place] [facility];  Service: Cardiovascular;  Laterality: Right;   ENDARTERECTOMY FEMORAL Right 08/29/2020   Procedure: ENDARTERECTOMY FEMORAL;  Surgeon: Elam Dutch, MD;  Location: [place];  Service: Vascular;  Laterality: Right;   EYE SURGERY     FEMORAL ARTERY EXPLORATION Right 06/25/2020   Procedure: RIGHT GROIN EXPLORATION Evacuation of Hematoma  WITH REPAIR OF RIGHT Common FEMORAL ARTERY.;  Surgeon: Angelia Mould, MD;  Location: [place];  Service: Vascular;  Laterality: Right;   FEMORAL-POPLITEAL BYPASS GRAFT Left 11/22/2020  Procedure: LEFT ABOVE KNEE-BELOW KNEE BYPASS TIBIAL PERITONEAL TRUNK REVERSE IPSILATERAL GREATER SAPHENOUS VEIN;  Surgeon: Elam Dutch, MD;  Location: [place];  Service: Vascular;  Laterality: Left;   LEG SURGERY     Trauma   LOWER EXTREMITY ANGIOGRAPHY Left 11/17/2020   Procedure: Lower Extremity Angiography;  Surgeon: Marty Heck, MD;  Location: [place] [facility];  Service: Cardiovascular;  Laterality:  Left;   PATCH ANGIOPLASTY Right 08/29/2020   Procedure: PATCH ANGIOPLASTY RIGHT BELOW KNEE POPLITEAL ARTERY AND RIGHT COMMON FEMORAL ARTERY;  Surgeon: Elam Dutch, MD;  Location: [facility];  Service: Vascular;  Laterality: Right;   RETINAL DETACHMENT SURGERY     ULTRASOUND GUIDANCE FOR VASCULAR ACCESS Bilateral 08/29/2020   Procedure: ULTRASOUND GUIDANCE FOR VASCULAR ACCESS;  Surgeon: Elam Dutch, MD;  Location: [facility];  Service: Vascular;  Laterality: Bilateral;    Family History  Problem Relation Age of Onset   Ovarian cancer Mother    Alcoholism Father    Breast cancer Sister    Ovarian cancer Sister     Social History   Socioeconomic History   Marital status: Married    Spouse name: Not on file   Number of children: 5   Years of education: Not on file   Highest education level: Not on file  Occupational History   Not on file  Tobacco Use   Smoking status: Every Day    Packs/day: 0.50    Years: 50.00    Pack years: 25.00    Types: Cigarettes   Smokeless tobacco: Never  Vaping Use   Vaping Use: Never used  Substance and Sexual Activity   Alcohol use: Yes    Alcohol/week: 0.0 standard drinks    Comment: 4 to 5 times a week; beer, wine and liquor    Drug use: No   Sexual activity: Not on file  Other Topics Concern   Not on file  Social History Narrative   Scores reading tests.  Lives with wife.     Social Determinants of Health   Financial Resource Strain: Not on file  Food Insecurity: Not on file  Transportation Needs: Not on file  Physical Activity: Not on file  Stress: Not on file  Social Connections: Not on file  Intimate Partner Violence: Not on file    Outpatient Medications Prior to Visit  Medication Sig Dispense Refill   acetaminophen (TYLENOL) 500 MG tablet Take 500 mg by mouth every 6 (six) hours as needed (for pain.).     cetirizine (ZYRTEC) 10 MG tablet Take 1 tablet (10 mg total) by mouth daily. (Patient taking differently: Take 10 mg by  mouth daily as needed for allergies.) 30 tablet 11   cholecalciferol (VITAMIN D) 25 MCG (1000 UNIT) tablet Take 1,000 Units by mouth 2 (two) times daily.     diltiazem (TIAZAC) 120 MG 24 hr capsule Take 1 capsule by mouth once daily (Patient taking differently: Take 120 mg by mouth daily.) 90 capsule 1   fluticasone (FLONASE) 50 MCG/ACT nasal spray Place 2 sprays into both nostrils daily. (Patient taking differently: Place 2 sprays into both nostrils daily as needed for allergies.) 16 g 6   metoprolol tartrate (LOPRESSOR) 25 MG tablet Take 1 tablet by mouth twice daily as needed (Patient taking differently: Take 25 mg by mouth 2 (two) times daily as needed (afib).) 30 tablet 0   rivaroxaban (XARELTO) 20 MG TABS tablet Take 1 tablet (20 mg total) by mouth daily with supper. (Patient  taking differently: Take 20 mg by mouth daily with supper.) 30 tablet 11   rosuvastatin (CRESTOR) 10 MG tablet Take 10 mg by mouth daily.     No facility-administered medications prior to visit.    Allergies  Allergen Reactions   Azithromycin Diarrhea   2,4-D Dimethylamine (Amisol) Other (See Comments) and Itching    ROS Review of Systems  Constitutional:  Negative for fatigue and unexpected weight change.  HENT:  Positive for ear pain and sore throat. Negative for postnasal drip, sinus pressure, sinus pain and trouble swallowing.   Eyes:  Negative for visual disturbance.  Respiratory:  Negative for cough, chest tightness and shortness of breath.   Cardiovascular:  Negative for chest pain, palpitations and leg swelling.  Gastrointestinal:  Negative for abdominal pain.  Neurological:  Negative for dizziness, syncope, weakness, light-headedness and headaches.  Hematological:  Negative for adenopathy.     Objective:    Physical Exam Vitals reviewed.  Constitutional:      Appearance: He is well-developed.  HENT:     Right Ear: Tympanic membrane normal.     Left Ear: Tympanic membrane normal.      Mouth/Throat:     Mouth: Mucous membranes are moist. No oral lesions.     Pharynx: Oropharynx is clear. No oropharyngeal exudate or posterior oropharyngeal erythema.  Cardiovascular:     Rate and Rhythm: Normal rate and regular rhythm.  Pulmonary:     Effort: Pulmonary effort is normal.     Breath sounds: Normal breath sounds.  Musculoskeletal:     Cervical back: Neck supple.  Lymphadenopathy:     Cervical: No cervical adenopathy.  Neurological:     Mental Status: He is alert.    BP 134/80 (BP Location: Left Arm)   Pulse 75   Temp 97.6 F (36.4 C) (Oral)   Wt 225 lb 1.6 oz (102.1 kg)   SpO2 97%   BMI 27.40 kg/m  Wt Readings from Last 3 Encounters:  03/08/21 225 lb 1.6 oz (102.1 kg)  03/01/21 225 lb (102.1 kg)  01/12/21 230 lb 12.8 oz (104.7 kg)     Health Maintenance Due  Topic Date Due   Hepatitis C Screening  Never done   Zoster Vaccines- Shingrix (1 of 2) Never done   TETANUS/TDAP  08/06/2013   COVID-19 Vaccine (3 - Pfizer risk series) 10/16/2019   INFLUENZA VACCINE  03/06/2021    There are no preventive care reminders to display for this patient.  Lab Results  Component Value Date   TSH 0.77 03/08/2015   Lab Results  Component Value Date   WBC 8.8 11/23/2020   HGB 12.2 (L) 11/23/2020   HCT 37.9 (L) 11/23/2020   MCV 88.8 11/23/2020   PLT 202 11/23/2020   Lab Results  Component Value Date   NA 134 (L) 11/23/2020   K 4.2 11/23/2020   CO2 26 11/23/2020   GLUCOSE 155 (H) 11/23/2020   BUN 10 11/23/2020   CREATININE 0.91 11/23/2020   BILITOT 0.9 11/22/2020   ALKPHOS 43 11/22/2020   AST 16 11/22/2020   ALT 16 11/22/2020   PROT 6.1 (L) 11/22/2020   ALBUMIN 3.4 (L) 11/22/2020   CALCIUM 8.1 (L) 11/23/2020   ANIONGAP 4 (L) 11/23/2020   GFR 87.77 04/01/2013   Lab Results  Component Value Date   CHOL 108 06/26/2020   Lab Results  Component Value Date   HDL 49 06/26/2020   Lab Results  Component Value Date   LDLCALC 50 06/26/2020  Lab Results   Component Value Date   TRIG 44 06/26/2020   Lab Results  Component Value Date   CHOLHDL 2.2 06/26/2020   No results found for: HGBA1C    Assessment & Plan:   #1 intermittent sore throat and left earache past few weeks.  Normal exam today.  Does not have any red flags such as adenopathy or any obvious oropharyngeal lesions.  Symptoms actually improved at this time. -Observation and follow-up for recurrent symptoms  #2 hypertension.  Initial blood pressure elevation today but improved after rest.  Generally well controlled by home readings. -Continue diltiazem and Lopressor  #3 dyslipidemia.  Goal LDL less than 70. -Continue Crestor and repeat lipids at 27-monthfollow-up  #4 peripheral vascular disease.  Strongly advocate he quit smoking.  Current motivation relatively low.  Discussed importance of secondary prevention with good lipid management, tight blood pressure control, smoking cessation   No orders of the defined types were placed in this encounter.   Follow-up: Return in about 3 months (around 06/08/2021).    BCarolann Littler MD

## 2021-03-08 NOTE — Patient Instructions (Signed)
Try to quit smoking  Consider Shingrix vaccine and check in insurance coverage if interested  Let me know if sore throat recurs.

## 2021-03-16 ENCOUNTER — Ambulatory Visit (HOSPITAL_COMMUNITY)
Admission: RE | Admit: 2021-03-16 | Discharge: 2021-03-16 | Disposition: A | Discharge status: Home / Self Care | Payer: Medicare HMO | Source: Ambulatory Visit | Attending: Vascular Surgery | Admitting: Vascular Surgery

## 2021-03-16 ENCOUNTER — Other Ambulatory Visit: Payer: Self-pay

## 2021-03-16 ENCOUNTER — Ambulatory Visit (INDEPENDENT_AMBULATORY_CARE_PROVIDER_SITE_OTHER)
Admission: RE | Admit: 2021-03-16 | Discharge: 2021-03-16 | Disposition: A | Discharge status: Home / Self Care | Payer: Medicare HMO | Source: Ambulatory Visit | Attending: Vascular Surgery | Admitting: Vascular Surgery

## 2021-03-16 ENCOUNTER — Ambulatory Visit (INDEPENDENT_AMBULATORY_CARE_PROVIDER_SITE_OTHER): Payer: Medicare HMO | Admitting: Physician Assistant

## 2021-03-16 VITALS — BP 152/86 | HR 66 | Temp 97.9°F | Ht 76.0 in | Wt 225.3 lb

## 2021-03-16 DIAGNOSIS — I739 Peripheral vascular disease, unspecified: Secondary | ICD-10-CM | POA: Insufficient documentation

## 2021-03-16 NOTE — Progress Notes (Signed)
Peripheral Arterial Disease Follow-Up   VASCULAR SURGERY ASSESSMENT & PLAN:   Cory [name] is a 75 y.o. male who presents for routine surveillance of PAD.  He is s/p left above-knee to below-knee popliteal artery bypass due to occluded popliteal artery aneurysm. This was performed by Dr. Oneida Alar on 11/22/2020.  Surgical history also significant for EVAR in January 2022 involving cutdown to repair right common femoral artery as well as right popliteal and tibial embolectomy with patch angioplasty of below the knee popliteal artery.  He is also s/p embolization of right ICA aneurysm on 06/24/2020.  Stable peripheral arterial disease.  Graft in patent. Significant improvement in left ABI as compared to March 2022.  Patient is without claudication or rest pain. Continue optimal medical management of hypertension and follow-up with primary care physician. Encouraged complete smoking cessation. Continue the following medications: Xarelto, statin Follow-up in January 2023 with aorta duplex and RLE arterial duplex. He has CTA of chest/aorta coming up per Dr. Percival Spanish.  SUBJECTIVE:   The patient denies lower extremity pain with exercise or rest pain.  Denies skin loss or ulceration. He says his left lower leg edema is improved. Ambulates independently.  PHYSICAL EXAM:   Vitals:   03/16/21 1108  BP: (!) 152/86  Pulse: 66  Temp: 97.9 F (36.6 C)  SpO2: 96%     General appearance: Well-developed, well-nourished in no apparent distress Neurologic: Alert and oriented x 4. Cardiovascular: Heart rate and rhythm are regular.   Abdomen: No palpable pulsatile mass. Extremities: LLE edema confined to lower leg and and spares his foot. Skin intact. Both feet are warm and well perfused.  Motor function and sensation intact Pulse exam: 2+ femoral and radial pulses bilaterally. Pedal pulses not palpable.   NON-INVASIVE VASCULAR STUDIES   03/16/2021 ABIs ABI Findings:   +---------+------------------+-----+----------+--------+  Right    Rt Pressure (mmHg)IndexWaveform  Comment   +---------+------------------+-----+----------+--------+  Brachial 135                                        +---------+------------------+-----+----------+--------+  PTA      151               1.12 monophasic          +---------+------------------+-----+----------+--------+  DP       129               0.96 monophasic          +---------+------------------+-----+----------+--------+  Great Toe80                0.59                     +---------+------------------+-----+----------+--------+   +---------+------------------+-----+----------+-------+  Left     Lt Pressure (mmHg)IndexWaveform  Comment  +---------+------------------+-----+----------+-------+  Brachial 131                                       +---------+------------------+-----+----------+-------+  PTA      159               1.18 triphasic          +---------+------------------+-----+----------+-------+  DP       159               1.18 monophasic         +---------+------------------+-----+----------+-------+  Great Toe70                0.52                    +---------+------------------+-----+----------+-------+   +-------+-----------+-----------+------------+------------+  ABI/TBIToday's ABIToday's TBIPrevious ABIPrevious TBI  +-------+-----------+-----------+------------+------------+  Right  1.12       0.59       1.26        0.56          +-------+-----------+-----------+------------+------------+  Left   1.18       0.52       0.65        0.41          +-------+-----------+-----------+------------+------------+  Previous ABI on 10/20/20 pre op.     Summary:  Right: Resting right ankle-brachial index is within normal range. The  right toe-brachial index is abnormal.   Left: Resting left ankle-brachial index is within normal  range. The left  toe-brachial index is abnormal.    *See table(s) above for measurements and observations.      Preliminary    LLE arterial duplex: Summary:  Left: Patent left AK pop to TPT bypass graft with no evidence of stenosis. Monophasic waveforms     See table(s) above for measurements and observations.  Preliminary      PROBLEM LIST:    The patient's past medical history, past surgical history, family history, social history, allergy list and medication list are reviewed.   CURRENT MEDS:    Current Outpatient Medications:    acetaminophen (TYLENOL) 500 MG tablet, Take 500 mg by mouth every 6 (six) hours as needed (for pain.)., Disp: , Rfl:    cetirizine (ZYRTEC) 10 MG tablet, Take 1 tablet (10 mg total) by mouth daily. (Patient taking differently: Take 10 mg by mouth daily as needed for allergies.), Disp: 30 tablet, Rfl: 11   cholecalciferol (VITAMIN D) 25 MCG (1000 UNIT) tablet, Take 1,000 Units by mouth 2 (two) times daily., Disp: , Rfl:    diltiazem (TIAZAC) 120 MG 24 hr capsule, Take 1 capsule by mouth once daily (Patient taking differently: Take 120 mg by mouth daily.), Disp: 90 capsule, Rfl: 1   fluticasone (FLONASE) 50 MCG/ACT nasal spray, Place 2 sprays into both nostrils daily. (Patient taking differently: Place 2 sprays into both nostrils daily as needed for allergies.), Disp: 16 g, Rfl: 6   metoprolol tartrate (LOPRESSOR) 25 MG tablet, Take 1 tablet by mouth twice daily as needed (Patient taking differently: Take 25 mg by mouth 2 (two) times daily as needed (afib).), Disp: 30 tablet, Rfl: 0   rivaroxaban (XARELTO) 20 MG TABS tablet, Take 1 tablet (20 mg total) by mouth daily with supper. (Patient taking differently: Take 20 mg by mouth daily with supper.), Disp: 30 tablet, Rfl: 11   rosuvastatin (CRESTOR) 10 MG tablet, Take 10 mg by mouth daily., Disp: , Rfl:    REVIEW OF SYSTEMS:   '[X]'$  denotes positive finding, '[ ]'$  denotes negative finding Cardiac  Comments:   Chest pain or chest pressure:    Shortness of breath upon exertion:    Short of breath when lying flat:    Irregular heart rhythm:        Vascular    Pain in calf, thigh, or hip brought on by ambulation:    Pain in feet at night that wakes you up from your sleep:     Blood clot in your veins:    Leg swelling:  Pulmonary    Oxygen at home:    Productive cough:     Wheezing:         Neurologic    Sudden weakness in arms or legs:     Sudden numbness in arms or legs:     Sudden onset of difficulty speaking or slurred speech:    Temporary loss of vision in one eye:     Problems with dizziness:         Gastrointestinal    Blood in stool:     Vomited blood:         Genitourinary    Burning when urinating:     Blood in urine:        Psychiatric    Major depression:         Hematologic    Bleeding problems:    Problems with blood clotting too easily:        Skin    Rashes or ulcers:        Constitutional    Fever or chills:     Barbie Banner, PA-C  Office: [phone] 03/16/2021

## 2021-03-17 ENCOUNTER — Other Ambulatory Visit: Payer: Self-pay

## 2021-03-17 DIAGNOSIS — I739 Peripheral vascular disease, unspecified: Secondary | ICD-10-CM

## 2021-03-17 DIAGNOSIS — I714 Abdominal aortic aneurysm, without rupture, unspecified: Secondary | ICD-10-CM

## 2021-03-20 ENCOUNTER — Telehealth: Payer: Self-pay | Admitting: Family Medicine

## 2021-03-20 NOTE — Telephone Encounter (Signed)
Left message for patient to call back and schedule Medicare Annual Wellness Visit (AWV) either virtually or in office.  Left both  my jabber number [phone] and office number    Last AWV ;06/05/17 please schedule at anytime with [facility] Nurse Health Advisor 1 or 2   This should be a 45 minute visit.

## 2021-03-28 DIAGNOSIS — C44212 Basal cell carcinoma of skin of right ear and external auricular canal: Secondary | ICD-10-CM | POA: Diagnosis not present

## 2021-03-28 DIAGNOSIS — D485 Neoplasm of uncertain behavior of skin: Secondary | ICD-10-CM | POA: Diagnosis not present

## 2021-03-28 DIAGNOSIS — C44612 Basal cell carcinoma of skin of right upper limb, including shoulder: Secondary | ICD-10-CM | POA: Diagnosis not present

## 2021-03-28 DIAGNOSIS — C44622 Squamous cell carcinoma of skin of right upper limb, including shoulder: Secondary | ICD-10-CM | POA: Diagnosis not present

## 2021-04-11 DIAGNOSIS — C44212 Basal cell carcinoma of skin of right ear and external auricular canal: Secondary | ICD-10-CM | POA: Diagnosis not present

## 2021-04-11 DIAGNOSIS — D0461 Carcinoma in situ of skin of right upper limb, including shoulder: Secondary | ICD-10-CM | POA: Diagnosis not present

## 2021-04-26 ENCOUNTER — Other Ambulatory Visit: Payer: Self-pay

## 2021-04-26 ENCOUNTER — Encounter: Payer: Self-pay | Admitting: Family Medicine

## 2021-04-26 ENCOUNTER — Other Ambulatory Visit: Payer: Self-pay | Admitting: Family Medicine

## 2021-04-26 ENCOUNTER — Ambulatory Visit (INDEPENDENT_AMBULATORY_CARE_PROVIDER_SITE_OTHER): Payer: Medicare HMO | Admitting: Family Medicine

## 2021-04-26 VITALS — BP 112/68 | HR 80 | Temp 98.3°F | Ht 76.0 in | Wt 227.7 lb

## 2021-04-26 DIAGNOSIS — J029 Acute pharyngitis, unspecified: Secondary | ICD-10-CM

## 2021-04-26 DIAGNOSIS — Z23 Encounter for immunization: Secondary | ICD-10-CM

## 2021-04-26 MED ORDER — PREDNISONE 10 MG PO TABS: ORAL_TABLET | ORAL | 0 refills | Status: DC

## 2021-04-26 MED ORDER — ROSUVASTATIN CALCIUM 10 MG PO TABS: 10.0000 mg | ORAL_TABLET | Freq: Every day | ORAL | 3 refills | Status: DC

## 2021-04-26 NOTE — Progress Notes (Signed)
Established Patient Office Visit  Subjective:  Patient ID: Cory [name], male    DOB: [birth year]  Age: 75 y.o. MRN: [id number]  CC:  Chief Complaint  Patient presents with   Sore Throat    Patient complains of sore throat, x2 months,    HPI Cory [name] presents for intermittent sore throat for 2 months.  He is aware of frequent postnasal drainage.  He thinks he is having some allergy drainage.  Never had any bloody or purulent secretions.  Takes Zyrtec and Flonase intermittently.  Still has had some drainage even with those.  Denies any headaches or facial pain.  No recent lymphadenopathy.  Good appetite.  No weight loss.  No active GERD symptoms.  No dysphagia.  Overall feels well.  He is aware of some dry mouth when he wakes up in the morning and may be doing some mouth breathing at night but sore throat symptoms are intermittent throughout the day.  Past Medical History:  Diagnosis Date   Aortic stenosis    a. Moderate by echo 05/2020.   Arthritis    right hip   Cancer ([place])    skin cancer nose, right left, upper left basel cell   Dysrhythmia    a-fib   PAF (paroxysmal atrial fibrillation) (HCC)    Peripheral arterial disease (HCC)    Tobacco abuse     Past Surgical History:  Procedure Laterality Date   ABDOMINAL AORTIC ENDOVASCULAR STENT GRAFT N/A 08/29/2020   Procedure: ABDOMINAL AORTIC ENDOVASCULAR STENT GRAFT;  Surgeon: Elam Dutch, MD;  Location: [place];  Service: Vascular;  Laterality: N/A;   ABDOMINAL AORTOGRAM W/LOWER EXTREMITY Bilateral 06/24/2020   Procedure: ABDOMINAL AORTOGRAM W/LOWER EXTREMITY;  Surgeon: Elam Dutch, MD;  Location: [place] [facility];  Service: Cardiovascular;  Laterality: Bilateral;   BACK SURGERY     2005   CERVICAL SPINE SURGERY     EMBOLECTOMY Right 08/29/2020   Procedure: POPLITEAL AND TIBIAL EMBOLECTOMY;  Surgeon: Elam Dutch, MD;  Location: [place];  Service: Vascular;  Laterality: Right;   EMBOLIZATION Right  06/24/2020   Procedure: EMBOLIZATION;  Surgeon: Elam Dutch, MD;  Location: [place] [facility];  Service: Cardiovascular;  Laterality: Right;   ENDARTERECTOMY FEMORAL Right 08/29/2020   Procedure: ENDARTERECTOMY FEMORAL;  Surgeon: Elam Dutch, MD;  Location: [place];  Service: Vascular;  Laterality: Right;   EYE SURGERY     FEMORAL ARTERY EXPLORATION Right 06/25/2020   Procedure: RIGHT GROIN EXPLORATION Evacuation of Hematoma  WITH REPAIR OF RIGHT Common FEMORAL ARTERY.;  Surgeon: Angelia Mould, MD;  Location: [place];  Service: Vascular;  Laterality: Right;   FEMORAL-POPLITEAL BYPASS GRAFT Left 11/22/2020   Procedure: LEFT ABOVE KNEE-BELOW KNEE BYPASS TIBIAL PERITONEAL TRUNK REVERSE IPSILATERAL GREATER SAPHENOUS VEIN;  Surgeon: Elam Dutch, MD;  Location: [place];  Service: Vascular;  Laterality: Left;   LEG SURGERY     Trauma   LOWER EXTREMITY ANGIOGRAPHY Left 11/17/2020   Procedure: Lower Extremity Angiography;  Surgeon: Marty Heck, MD;  Location: [place] [facility];  Service: Cardiovascular;  Laterality: Left;   PATCH ANGIOPLASTY Right 08/29/2020   Procedure: PATCH ANGIOPLASTY RIGHT BELOW KNEE POPLITEAL ARTERY AND RIGHT COMMON FEMORAL ARTERY;  Surgeon: Elam Dutch, MD;  Location: [facility] OR;  Service: Vascular;  Laterality: Right;   RETINAL DETACHMENT SURGERY     ULTRASOUND GUIDANCE FOR VASCULAR ACCESS Bilateral 08/29/2020   Procedure: ULTRASOUND GUIDANCE FOR VASCULAR ACCESS;  Surgeon: Oneida Alar,  Jessy Oto, MD;  Location: [facility] OR;  Service: Vascular;  Laterality: Bilateral;    Family History  Problem Relation Age of Onset   Ovarian cancer Mother    Alcoholism Father    Breast cancer Sister    Ovarian cancer Sister     Social History   Socioeconomic History   Marital status: Married    Spouse name: Not on file   Number of children: 5   Years of education: Not on file   Highest education level: Not on file  Occupational History   Not on file  Tobacco Use    Smoking status: Every Day    Packs/day: 0.50    Years: 50.00    Pack years: 25.00    Types: Cigarettes   Smokeless tobacco: Never  Vaping Use   Vaping Use: Never used  Substance and Sexual Activity   Alcohol use: Yes    Alcohol/week: 0.0 standard drinks    Comment: 4 to 5 times a week; beer, wine and liquor    Drug use: No   Sexual activity: Not on file  Other Topics Concern   Not on file  Social History Narrative   Scores reading tests.  Lives with wife.     Social Determinants of Health   Financial Resource Strain: Not on file  Food Insecurity: Not on file  Transportation Needs: Not on file  Physical Activity: Not on file  Stress: Not on file  Social Connections: Not on file  Intimate Partner Violence: Not on file    Outpatient Medications Prior to Visit  Medication Sig Dispense Refill   acetaminophen (TYLENOL) 500 MG tablet Take 500 mg by mouth every 6 (six) hours as needed (for pain.).     cetirizine (ZYRTEC) 10 MG tablet Take 1 tablet (10 mg total) by mouth daily. (Patient taking differently: Take 10 mg by mouth daily as needed for allergies.) 30 tablet 11   cholecalciferol (VITAMIN D) 25 MCG (1000 UNIT) tablet Take 1,000 Units by mouth 2 (two) times daily.     diltiazem (TIAZAC) 120 MG 24 hr capsule Take 1 capsule by mouth once daily (Patient taking differently: Take 120 mg by mouth daily.) 90 capsule 1   fluticasone (FLONASE) 50 MCG/ACT nasal spray Place 2 sprays into both nostrils daily. (Patient taking differently: Place 2 sprays into both nostrils daily as needed for allergies.) 16 g 6   metoprolol tartrate (LOPRESSOR) 25 MG tablet Take 1 tablet by mouth twice daily as needed (Patient taking differently: Take 25 mg by mouth 2 (two) times daily as needed (afib).) 30 tablet 0   rivaroxaban (XARELTO) 20 MG TABS tablet Take 1 tablet (20 mg total) by mouth daily with supper. (Patient taking differently: Take 20 mg by mouth daily with supper.) 30 tablet 11   rosuvastatin  (CRESTOR) 10 MG tablet Take 10 mg by mouth daily.     No facility-administered medications prior to visit.    Allergies  Allergen Reactions   Azithromycin Diarrhea   2,4-D Dimethylamine (Amisol) Other (See Comments) and Itching    ROS Review of Systems  Constitutional:  Negative for appetite change, chills, fever and unexpected weight change.  HENT:  Positive for postnasal drip and sore throat.   Respiratory:  Negative for cough and shortness of breath.   Hematological:  Negative for adenopathy.     Objective:    Physical Exam Vitals reviewed.  Constitutional:      Appearance: He is well-developed.  HENT:  Right Ear: Tympanic membrane normal.     Left Ear: Tympanic membrane normal.     Mouth/Throat:     Mouth: Mucous membranes are moist. No oral lesions.     Pharynx: Oropharynx is clear. No posterior oropharyngeal erythema.     Tonsils: No tonsillar exudate.  Cardiovascular:     Rate and Rhythm: Normal rate.  Musculoskeletal:     Cervical back: Neck supple.  Lymphadenopathy:     Cervical: No cervical adenopathy.  Neurological:     Mental Status: He is alert.    BP 112/68 (BP Location: Left Arm, Patient Position: Sitting, Cuff Size: Normal)   Pulse 80   Temp 98.3 F (36.8 C) (Oral)   Ht 6\' 4"  (1.93 m)   Wt 227 lb 11.2 oz (103.3 kg)   SpO2 91%   BMI 27.72 kg/m  Wt Readings from Last 3 Encounters:  04/26/21 227 lb 11.2 oz (103.3 kg)  03/16/21 225 lb 4.8 oz (102.2 kg)  03/08/21 225 lb 1.6 oz (102.1 kg)     Health Maintenance Due  Topic Date Due   Hepatitis C Screening  Never done   Zoster Vaccines- Shingrix (1 of 2) Never done   TETANUS/TDAP  08/06/2013   COVID-19 Vaccine (3 - Pfizer risk series) 10/16/2019   INFLUENZA VACCINE  03/06/2021    There are no preventive care reminders to display for this patient.  Lab Results  Component Value Date   TSH 0.77 03/08/2015   Lab Results  Component Value Date   WBC 8.8 11/23/2020   HGB 12.2 (L)  11/23/2020   HCT 37.9 (L) 11/23/2020   MCV 88.8 11/23/2020   PLT 202 11/23/2020   Lab Results  Component Value Date   NA 134 (L) 11/23/2020   K 4.2 11/23/2020   CO2 26 11/23/2020   GLUCOSE 155 (H) 11/23/2020   BUN 10 11/23/2020   CREATININE 0.91 11/23/2020   BILITOT 0.9 11/22/2020   ALKPHOS 43 11/22/2020   AST 16 11/22/2020   ALT 16 11/22/2020   PROT 6.1 (L) 11/22/2020   ALBUMIN 3.4 (L) 11/22/2020   CALCIUM 8.1 (L) 11/23/2020   ANIONGAP 4 (L) 11/23/2020   GFR 87.77 04/01/2013   Lab Results  Component Value Date   CHOL 108 06/26/2020   Lab Results  Component Value Date   HDL 49 06/26/2020   Lab Results  Component Value Date   LDLCALC 50 06/26/2020   Lab Results  Component Value Date   TRIG 44 06/26/2020   Lab Results  Component Value Date   CHOLHDL 2.2 06/26/2020   No results found for: HGBA1C    Assessment & Plan:   Intermittent sore throat for couple months.  Normal exam.  No lesions noted.  No active reflux symptoms.  Does have frequent postnasal drip symptoms.  Does not describe any predictors of likely chronic sinusitis.  Denies any appetite change and has had no documented weight loss or lymphadenopathy.  Does have longstanding history of nicotine use.  -Trial of prednisone taper over the next week.  If symptoms not improved with reduction of postnasal drip on prednisone be in touch and we will set up ENT referral  Meds ordered this encounter  Medications   rosuvastatin (CRESTOR) 10 MG tablet    Sig: Take 1 tablet (10 mg total) by mouth daily.    Dispense:  90 tablet    Refill:  3   predniSONE (DELTASONE) 10 MG tablet    Sig: Taper as follows over  10 days: 4-4-4-3-3-3-2-2-1-1    Dispense:  27 tablet    Refill:  0    Follow-up: No follow-ups on file.    Carolann Littler, MD

## 2021-04-26 NOTE — Patient Instructions (Signed)
Let me know if sore throat not fully resolving with the prednisone.    I would consider ENT referral if not improved with that.

## 2021-04-26 NOTE — Telephone Encounter (Signed)
Last refill- 10/20/20 Appointment scheduled today 04/26/21 Last lipid labs-06/26/20  Can this patient receive a refill?

## 2021-04-28 ENCOUNTER — Other Ambulatory Visit: Payer: Self-pay

## 2021-04-28 MED ORDER — DILTIAZEM HCL ER BEADS 120 MG PO CP24: 120.0000 mg | ORAL_CAPSULE | Freq: Every day | ORAL | 0 refills | Status: DC

## 2021-05-29 ENCOUNTER — Encounter (HOSPITAL_COMMUNITY): Payer: Self-pay

## 2021-05-29 ENCOUNTER — Other Ambulatory Visit: Payer: Self-pay

## 2021-05-29 ENCOUNTER — Ambulatory Visit (HOSPITAL_COMMUNITY)
Admission: RE | Admit: 2021-05-29 | Discharge: 2021-05-29 | Disposition: A | Discharge status: Home / Self Care | Payer: Medicare HMO | Source: Ambulatory Visit | Attending: Cardiology | Admitting: Cardiology

## 2021-05-29 DIAGNOSIS — Z961 Presence of intraocular lens: Secondary | ICD-10-CM | POA: Diagnosis not present

## 2021-05-29 DIAGNOSIS — H524 Presbyopia: Secondary | ICD-10-CM | POA: Diagnosis not present

## 2021-05-29 DIAGNOSIS — R911 Solitary pulmonary nodule: Secondary | ICD-10-CM | POA: Diagnosis not present

## 2021-05-29 DIAGNOSIS — J432 Centrilobular emphysema: Secondary | ICD-10-CM | POA: Diagnosis not present

## 2021-05-29 DIAGNOSIS — I7121 Aneurysm of the ascending aorta, without rupture: Secondary | ICD-10-CM | POA: Diagnosis not present

## 2021-05-29 DIAGNOSIS — I350 Nonrheumatic aortic (valve) stenosis: Secondary | ICD-10-CM | POA: Diagnosis not present

## 2021-05-29 DIAGNOSIS — H10413 Chronic giant papillary conjunctivitis, bilateral: Secondary | ICD-10-CM | POA: Diagnosis not present

## 2021-05-29 LAB — POCT I-STAT CREATININE: Creatinine, Ser: 0.9 mg/dL (ref 0.61–1.24)

## 2021-05-29 MED ORDER — IOHEXOL 350 MG/ML SOLN
100.0000 mL | Freq: Once | INTRAVENOUS | Status: AC | PRN
  Administered 2021-05-29: 100 mL via INTRAVENOUS

## 2021-05-30 ENCOUNTER — Other Ambulatory Visit: Payer: Self-pay | Admitting: Cardiology

## 2021-05-30 NOTE — Telephone Encounter (Signed)
Prescription refill request for Xarelto received.  Indication:afib Last office visit:hochrein 05/13/20 overdue  MCNOBS:962.8ZM Age:39m Scr:0.9 05/29/21 CrCl:100.4

## 2021-06-07 ENCOUNTER — Ambulatory Visit (INDEPENDENT_AMBULATORY_CARE_PROVIDER_SITE_OTHER): Payer: Medicare HMO

## 2021-06-07 DIAGNOSIS — Z122 Encounter for screening for malignant neoplasm of respiratory organs: Secondary | ICD-10-CM

## 2021-06-07 DIAGNOSIS — Z0000 Encounter for general adult medical examination without abnormal findings: Secondary | ICD-10-CM | POA: Diagnosis not present

## 2021-06-07 NOTE — Patient Instructions (Signed)
[name] , Thank you for taking time to come for your Medicare Wellness Visit. I appreciate your ongoing commitment to your health goals. Please review the following plan we discussed and let me know if I can assist you in the future.   Screening recommendations/referrals: Colonoscopy: no longer required  Recommended yearly ophthalmology/optometry visit for glaucoma screening and checkup Recommended yearly dental visit for hygiene and checkup  Vaccinations: Influenza vaccine: completed  Pneumococcal vaccine: completed  Tdap vaccine: due with injury  Shingles vaccine: will consider     Advanced directives: none   Conditions/risks identified: none   Next appointment: none   Preventive Care 77 Years and Older, Male Preventive care refers to lifestyle choices and visits with your health care provider that can promote health and wellness. What does preventive care include? A yearly physical exam. This is also called an annual well check. Dental exams once or twice a year. Routine eye exams. Ask your health care provider how often you should have your eyes checked. Personal lifestyle choices, including: Daily care of your teeth and gums. Regular physical activity. Eating a healthy diet. Avoiding tobacco and drug use. Limiting alcohol use. Practicing safe sex. Taking low doses of aspirin every day. Taking vitamin and mineral supplements as recommended by your health care provider. What happens during an annual well check? The services and screenings done by your health care provider during your annual well check will depend on your age, overall health, lifestyle risk factors, and family history of disease. Counseling  Your health care provider may ask you questions about your: Alcohol use. Tobacco use. Drug use. Emotional well-being. Home and relationship well-being. Sexual activity. Eating habits. History of falls. Memory and ability to understand (cognition). Work and work  Statistician. Screening  You may have the following tests or measurements: Height, weight, and BMI. Blood pressure. Lipid and cholesterol levels. These may be checked every 5 years, or more frequently if you are over 52 years old. Skin check. Lung cancer screening. You may have this screening every year starting at age 16 if you have a 30-pack-year history of smoking and currently smoke or have quit within the past 15 years. Fecal occult blood test (FOBT) of the stool. You may have this test every year starting at age 30. Flexible sigmoidoscopy or colonoscopy. You may have a sigmoidoscopy every 5 years or a colonoscopy every 10 years starting at age 27. Prostate cancer screening. Recommendations will vary depending on your family history and other risks. Hepatitis C blood test. Hepatitis B blood test. Sexually transmitted disease (STD) testing. Diabetes screening. This is done by checking your blood sugar (glucose) after you have not eaten for a while (fasting). You may have this done every 1-3 years. Abdominal aortic aneurysm (AAA) screening. You may need this if you are a current or former smoker. Osteoporosis. You may be screened starting at age 40 if you are at high risk. Talk with your health care provider about your test results, treatment options, and if necessary, the need for more tests. Vaccines  Your health care provider may recommend certain vaccines, such as: Influenza vaccine. This is recommended every year. Tetanus, diphtheria, and acellular pertussis (Tdap, Td) vaccine. You may need a Td booster every 10 years. Zoster vaccine. You may need this after age 30. Pneumococcal 13-valent conjugate (PCV13) vaccine. One dose is recommended after age 27. Pneumococcal polysaccharide (PPSV23) vaccine. One dose is recommended after age 22. Talk to your health care provider about which screenings and vaccines  you need and how often you need them. This information is not intended to replace  advice given to you by your health care provider. Make sure you discuss any questions you have with your health care provider. Document Released: 08/19/2015 Document Revised: 04/11/2016 Document Reviewed: 05/24/2015 Elsevier Interactive Patient Education  2017 [employer] Prevention in the Home Falls can cause injuries. They can happen to people of all ages. There are many things you can do to make your home safe and to help prevent falls. What can I do on the outside of my home? Regularly fix the edges of walkways and driveways and fix any cracks. Remove anything that might make you trip as you walk through a door, such as a raised step or threshold. Trim any bushes or trees on the path to your home. Use bright outdoor lighting. Clear any walking paths of anything that might make someone trip, such as rocks or tools. Regularly check to see if handrails are loose or broken. Make sure that both sides of any steps have handrails. Any raised decks and porches should have guardrails on the edges. Have any leaves, snow, or ice cleared regularly. Use sand or salt on walking paths during winter. Clean up any spills in your garage right away. This includes oil or grease spills. What can I do in the bathroom? Use night lights. Install grab bars by the toilet and in the tub and shower. Do not use towel bars as grab bars. Use non-skid mats or decals in the tub or shower. If you need to sit down in the shower, use a plastic, non-slip stool. Keep the floor dry. Clean up any water that spills on the floor as soon as it happens. Remove soap buildup in the tub or shower regularly. Attach bath mats securely with double-sided non-slip rug tape. Do not have throw rugs and other things on the floor that can make you trip. What can I do in the bedroom? Use night lights. Make sure that you have a light by your bed that is easy to reach. Do not use any sheets or blankets that are too big for your bed.  They should not hang down onto the floor. Have a firm chair that has side arms. You can use this for support while you get dressed. Do not have throw rugs and other things on the floor that can make you trip. What can I do in the kitchen? Clean up any spills right away. Avoid walking on wet floors. Keep items that you use a lot in easy-to-reach places. If you need to reach something above you, use a strong step stool that has a grab bar. Keep electrical cords out of the way. Do not use floor polish or wax that makes floors slippery. If you must use wax, use non-skid floor wax. Do not have throw rugs and other things on the floor that can make you trip. What can I do with my stairs? Do not leave any items on the stairs. Make sure that there are handrails on both sides of the stairs and use them. Fix handrails that are broken or loose. Make sure that handrails are as long as the stairways. Check any carpeting to make sure that it is firmly attached to the stairs. Fix any carpet that is loose or worn. Avoid having throw rugs at the top or bottom of the stairs. If you do have throw rugs, attach them to the floor with carpet tape. Make sure  that you have a light switch at the top of the stairs and the bottom of the stairs. If you do not have them, ask someone to add them for you. What else can I do to help prevent falls? Wear shoes that: Do not have high heels. Have rubber bottoms. Are comfortable and fit you well. Are closed at the toe. Do not wear sandals. If you use a stepladder: Make sure that it is fully opened. Do not climb a closed stepladder. Make sure that both sides of the stepladder are locked into place. Ask someone to hold it for you, if possible. Clearly mark and make sure that you can see: Any grab bars or handrails. First and last steps. Where the edge of each step is. Use tools that help you move around (mobility aids) if they are needed. These  include: Canes. Walkers. Scooters. Crutches. Turn on the lights when you go into a dark area. Replace any light bulbs as soon as they burn out. Set up your furniture so you have a clear path. Avoid moving your furniture around. If any of your floors are uneven, fix them. If there are any pets around you, be aware of where they are. Review your medicines with your doctor. Some medicines can make you feel dizzy. This can increase your chance of falling. Ask your doctor what other things that you can do to help prevent falls. This information is not intended to replace advice given to you by your health care provider. Make sure you discuss any questions you have with your health care provider. Document Released: 05/19/2009 Document Revised: 12/29/2015 Document Reviewed: 08/27/2014 Elsevier Interactive Patient Education  2017 [employer].

## 2021-06-07 NOTE — Progress Notes (Signed)
Subjective:   Cory [name] is a 75 y.o. male who presents for an Subsequent Medicare Annual Wellness Visit.  I connected with Cory [name] today by telephone and verified that I am speaking with the correct person using two identifiers. Location patient: home Location provider: work Persons participating in the virtual visit: patient, provider.   I discussed the limitations, risks, security and privacy concerns of performing an evaluation and management service by telephone and the availability of in person appointments. I also discussed with the patient that there may be a patient responsible charge related to this service. The patient expressed understanding and verbally consented to this telephonic visit.    Interactive audio and video telecommunications were attempted between this provider and patient, however failed, due to patient having technical difficulties OR patient did not have access to video capability.  We continued and completed visit with audio only.    Review of Systems     Cardiac Risk Factors include: advanced age (>66men, >50 women);dyslipidemia;hypertension;male gender     Objective:    Today's Vitals   There is no height or weight on file to calculate BMI.  Advanced Directives 06/07/2021 11/22/2020 11/17/2020 08/24/2020 06/25/2020 06/25/2020 06/24/2020  Does Patient Have a Medical Advance Directive? No Yes Yes No No No No  Type of Advance Directive - - [place]  Does patient want to make changes to medical advance directive? - No - Patient declined No - Patient declined - - - -  Would patient like information on creating a medical advance directive? No - Patient declined - - No - Patient declined No - Patient declined No - Patient declined No - Patient declined    Current Medications (verified) Outpatient Encounter Medications as of 06/07/2021  Medication Sig   acetaminophen (TYLENOL) 500 MG tablet Take 500 mg by mouth every 6 (six)  hours as needed (for pain.).   cetirizine (ZYRTEC) 10 MG tablet Take 1 tablet (10 mg total) by mouth daily. (Patient taking differently: Take 10 mg by mouth daily as needed for allergies.)   cholecalciferol (VITAMIN D) 25 MCG (1000 UNIT) tablet Take 1,000 Units by mouth 2 (two) times daily.   diltiazem (TIAZAC) 120 MG 24 hr capsule Take 1 capsule (120 mg total) by mouth daily.   fluticasone (FLONASE) 50 MCG/ACT nasal spray Place 2 sprays into both nostrils daily. (Patient taking differently: Place 2 sprays into both nostrils daily as needed for allergies.)   metoprolol tartrate (LOPRESSOR) 25 MG tablet Take 1 tablet by mouth twice daily as needed (Patient taking differently: Take 25 mg by mouth 2 (two) times daily as needed (afib).)   rivaroxaban (XARELTO) 20 MG TABS tablet TAKE 1 TABLET BY MOUTH ONCE DAILY WITH SUPPER   rosuvastatin (CRESTOR) 10 MG tablet Take 1 tablet (10 mg total) by mouth daily.   predniSONE (DELTASONE) 10 MG tablet Taper as follows over 10 days: 4-4-4-3-3-3-2-2-1-1 (Patient not taking: Reported on 06/07/2021)   No facility-administered encounter medications on file as of 06/07/2021.    Allergies (verified) Azithromycin and 2,4-d dimethylamine (amisol)   History: Past Medical History:  Diagnosis Date   Aortic stenosis    a. Moderate by echo 05/2020.   Arthritis    right hip   Cancer ([place])    skin cancer nose, right left, upper left basel cell   Dysrhythmia    a-fib   PAF (paroxysmal atrial fibrillation) ([place])    Peripheral arterial disease ([place])  Tobacco abuse    Past Surgical History:  Procedure Laterality Date   ABDOMINAL AORTIC ENDOVASCULAR STENT GRAFT N/A 08/29/2020   Procedure: ABDOMINAL AORTIC ENDOVASCULAR STENT GRAFT;  Surgeon: Elam Dutch, MD;  Location: [place];  Service: Vascular;  Laterality: N/A;   ABDOMINAL AORTOGRAM W/LOWER EXTREMITY Bilateral 06/24/2020   Procedure: ABDOMINAL AORTOGRAM W/LOWER EXTREMITY;  Surgeon: Elam Dutch, MD;   Location: [place] [facility];  Service: Cardiovascular;  Laterality: Bilateral;   BACK SURGERY     2005   CERVICAL SPINE SURGERY     EMBOLECTOMY Right 08/29/2020   Procedure: POPLITEAL AND TIBIAL EMBOLECTOMY;  Surgeon: Elam Dutch, MD;  Location: [place];  Service: Vascular;  Laterality: Right;   EMBOLIZATION Right 06/24/2020   Procedure: EMBOLIZATION;  Surgeon: Elam Dutch, MD;  Location: [place] [facility];  Service: Cardiovascular;  Laterality: Right;   ENDARTERECTOMY FEMORAL Right 08/29/2020   Procedure: ENDARTERECTOMY FEMORAL;  Surgeon: Elam Dutch, MD;  Location: [place];  Service: Vascular;  Laterality: Right;   EYE SURGERY     FEMORAL ARTERY EXPLORATION Right 06/25/2020   Procedure: RIGHT GROIN EXPLORATION Evacuation of Hematoma  WITH REPAIR OF RIGHT Common FEMORAL ARTERY.;  Surgeon: Angelia Mould, MD;  Location: [place];  Service: Vascular;  Laterality: Right;   FEMORAL-POPLITEAL BYPASS GRAFT Left 11/22/2020   Procedure: LEFT ABOVE KNEE-BELOW KNEE BYPASS TIBIAL PERITONEAL TRUNK REVERSE IPSILATERAL GREATER SAPHENOUS VEIN;  Surgeon: Elam Dutch, MD;  Location: [place];  Service: Vascular;  Laterality: Left;   LEG SURGERY     Trauma   LOWER EXTREMITY ANGIOGRAPHY Left 11/17/2020   Procedure: Lower Extremity Angiography;  Surgeon: Marty Heck, MD;  Location: [place] [facility];  Service: Cardiovascular;  Laterality: Left;   PATCH ANGIOPLASTY Right 08/29/2020   Procedure: PATCH ANGIOPLASTY RIGHT BELOW KNEE POPLITEAL ARTERY AND RIGHT COMMON FEMORAL ARTERY;  Surgeon: Elam Dutch, MD;  Location: [facility] OR;  Service: Vascular;  Laterality: Right;   RETINAL DETACHMENT SURGERY     ULTRASOUND GUIDANCE FOR VASCULAR ACCESS Bilateral 08/29/2020   Procedure: ULTRASOUND GUIDANCE FOR VASCULAR ACCESS;  Surgeon: Elam Dutch, MD;  Location: [facility] OR;  Service: Vascular;  Laterality: Bilateral;   Family History  Problem Relation Age of Onset   Ovarian cancer Mother     Alcoholism Father    Breast cancer Sister    Ovarian cancer Sister    Social History   Socioeconomic History   Marital status: Married    Spouse name: Not on file   Number of children: 5   Years of education: Not on file   Highest education level: Not on file  Occupational History   Not on file  Tobacco Use   Smoking status: Every Day    Packs/day: 0.50    Years: 50.00    Pack years: 25.00    Types: Cigarettes   Smokeless tobacco: Never  Vaping Use   Vaping Use: Never used  Substance and Sexual Activity   Alcohol use: Yes    Alcohol/week: 0.0 standard drinks    Comment: 4 to 5 times a week; beer, wine and liquor    Drug use: No   Sexual activity: Not on file  Other Topics Concern   Not on file  Social History Narrative   Scores reading tests.  Lives with wife.     Social Determinants of Health   Financial Resource Strain: Low Risk    Difficulty of Paying Living Expenses: Not hard at  all  Food Insecurity: No Food Insecurity   Worried About Charity fundraiser in the Last Year: Never true   Ran Out of Food in the Last Year: Never true  Transportation Needs: No Transportation Needs   Lack of Transportation (Medical): No   Lack of Transportation (Non-Medical): No  Physical Activity: Insufficiently Active   Days of Exercise per Week: 5 days   Minutes of Exercise per Session: 20 min  Stress: No Stress Concern Present   Feeling of Stress : Not at all  Social Connections: Moderately Isolated   Frequency of Communication with Friends and Family: Twice a week   Frequency of Social Gatherings with Friends and Family: Twice a week   Attends Religious Services: Never   Printmaker: No   Attends Music therapist: Never   Marital Status: Married    Tobacco Counseling Ready to quit: Not Answered Counseling given: Not Answered   Clinical Intake:  Pre-visit preparation completed: Yes  Pain : No/denies pain     Nutritional  Risks: None Diabetes: No  How often do you need to have someone help you when you read instructions, pamphlets, or other written materials from your doctor or pharmacy?: 1 - Never What is the last grade level you completed in school?: master  Diabetic?no  Interpreter Needed?: No  Information entered by :: [place] of Daily Living In your present state of health, do you have any difficulty performing the following activities: 06/07/2021 11/22/2020  Hearing? N -  Vision? N -  Difficulty concentrating or making decisions? N -  Walking or climbing stairs? N -  Dressing or bathing? N -  Doing errands, shopping? N N  Preparing Food and eating ? N -  Using the Toilet? N -  In the past six months, have you accidently leaked urine? N -  Do you have problems with loss of bowel control? N -  Managing your Medications? N -  Managing your Finances? N -  Housekeeping or managing your Housekeeping? N -  Some recent data might be hidden    Patient Care Team: [name], MD as PCP - General  Indicate any recent Medical Services you may have received from other than [facility] providers in the past year (date may be approximate).     Assessment:   This is a routine wellness examination for [place].  Hearing/Vision screen Vision Screening - Comments:: Annual eye exams wears glasses   Dietary issues and exercise activities discussed: Current Exercise Habits: Home exercise routine, Type of exercise: walking, Time (Minutes): 30, Frequency (Times/Week): 5, Weekly Exercise (Minutes/Week): 150, Intensity: Mild, Exercise limited by: orthopedic condition(s)   Goals Addressed             This Visit's Progress    Quit smoking / using tobacco   On track    Smoking;  Educated to avoid secondary smoke Smoking cessation in [place]: [place] at [phone] -day Smoking cessation in [place]: Evening; [phone] Smoking cessation at [facility]: [phone] Smoking  cessation at [facility]: [phone] Classes offered a couple of times a month;  Will work with the patient as far as registration and location  Meds may help; chatix (Varenicline); Zyban (Bupropion SR); Nicotine Replacement (gum; lozenges; patches; etc.)   30 pack yr smoking hx: Educated regarding LDCT; To discuss with MD at next fup.  [place] helpline to help you quit  1-800-QUIT-NOW [phone]).         Depression  Screen PHQ 2/9 Scores 06/07/2021 06/07/2021 03/16/2021 03/01/2021 08/02/2017 06/05/2017 12/10/2016  PHQ - 2 Score 0 0 0 0 0 0 0    Fall Risk Fall Risk  06/07/2021 03/16/2021 03/01/2021 08/02/2017 06/05/2017  Falls in the past year? 0 0 0 No No  Number falls in past yr: 0 - 0 - -  Injury with Fall? 0 - 0 - -  Risk for fall due to : History of fall(s) - - - -  Follow up - - Falls evaluation completed - -    FALL RISK PREVENTION PERTAINING TO THE HOME:  Any stairs in or around the home? Yes  If so, are there any without handrails? Yes  Home free of loose throw rugs in walkways, pet beds, electrical cords, etc? Yes  Adequate lighting in your home to reduce risk of falls? Yes   ASSISTIVE DEVICES UTILIZED TO PREVENT FALLS:  Life alert? No  Use of a cane, walker or w/c? No  Grab bars in the bathroom? Yes  Shower chair or bench in shower? Yes  Elevated toilet seat or a handicapped toilet? Yes    Cognitive Function:  Normal cognitive status assessed by direct observation by this Nurse Health Advisor. No abnormalities found.        Immunizations Immunization History  Administered Date(s) Administered   Fluad Quad(high Dose 65+) 06/27/2020, 04/26/2021   Influenza Whole 07/14/2009   Influenza, High Dose Seasonal PF 08/16/2016, 06/05/2017   Influenza,inj,Quad PF,6+ Mos 08/09/2014, 04/29/2018   Influenza-Unspecified 09/23/2015   PFIZER(Purple Top)SARS-COV-2 Vaccination 08/28/2019, 09/18/2019   Pneumococcal Conjugate-13 06/05/2017   Pneumococcal Polysaccharide-23  07/14/2009, 04/01/2013   Td 08/07/2003    TDAP status: Due, Education has been provided regarding the importance of this vaccine. Advised may receive this vaccine at local pharmacy or Health Dept. Aware to provide a copy of the vaccination record if obtained from local pharmacy or Health Dept. Verbalized acceptance and understanding.  Flu Vaccine status: Up to date  Pneumococcal vaccine status: Up to date  Covid-19 vaccine status: Completed vaccines  Qualifies for Shingles Vaccine? Yes   Zostavax completed No   Shingrix Completed?: No.    Education has been provided regarding the importance of this vaccine. Patient has been advised to call insurance company to determine out of pocket expense if they have not yet received this vaccine. Advised may also receive vaccine at local pharmacy or Health Dept. Verbalized acceptance and understanding.  Screening Tests Health Maintenance  Topic Date Due   Hepatitis C Screening  Never done   Zoster Vaccines- Shingrix (1 of 2) Never done   TETANUS/TDAP  08/06/2013   COVID-19 Vaccine (3 - Pfizer risk series) 10/16/2019   Pneumonia Vaccine 77+ Years old  Completed   INFLUENZA VACCINE  Completed   HPV VACCINES  Aged Out    Health Maintenance  Health Maintenance Due  Topic Date Due   Hepatitis C Screening  Never done   Zoster Vaccines- Shingrix (1 of 2) Never done   TETANUS/TDAP  08/06/2013   COVID-19 Vaccine (3 - Pfizer risk series) 10/16/2019    Colorectal cancer screening: No longer required.   Lung Cancer Screening: (Low Dose CT Chest recommended if Age 75-80 years, 30 pack-year currently smoking OR have quit w/in 15years.) does qualify.   Lung Cancer Screening Referral: completed 06/07/2021 / Additional Screening:  Hepatitis C Screening: does qualify;   Vision Screening: Recommended annual ophthalmology exams for early detection of glaucoma and other disorders of the eye. Is the patient up  to date with their annual eye exam?  Yes   Who is the provider or what is the name of the office in which the patient attends annual eye exams? Dr.Bowman If pt is not established with a provider, would they like to be referred to a provider to establish care? No .   Dental Screening: Recommended annual dental exams for proper oral hygiene  Community Resource Referral / Chronic Care Management: CRR required this visit?  No   CCM required this visit?  No      Plan:     I have personally reviewed and noted the following in the patient's chart:   Medical and social history Use of alcohol, tobacco or illicit drugs  Current medications and supplements including opioid prescriptions. Patient is not currently taking opioid prescriptions. Functional ability and status Nutritional status Physical activity Advanced directives List of other physicians Hospitalizations, surgeries, and ER visits in previous 12 months Vitals Screenings to include cognitive, depression, and falls Referrals and appointments  In addition, I have reviewed and discussed with patient certain preventive protocols, quality metrics, and best practice recommendations. A written personalized care plan for preventive services as well as general preventive health recommendations were provided to patient.     Randel Pigg, LPN   [id number]   Nurse Notes: none

## 2021-06-19 ENCOUNTER — Telehealth: Payer: Self-pay | Admitting: Family Medicine

## 2021-06-19 DIAGNOSIS — J312 Chronic pharyngitis: Secondary | ICD-10-CM

## 2021-06-19 NOTE — Telephone Encounter (Signed)
Pt last seen dr Elease Hashimoto  for sore throat on 04-26-2021 and twice prior to last visit for sore throat  and per pt dr Elease Hashimoto told him he would refer him to ENT . Pt is calling to get a referral

## 2021-06-20 NOTE — Telephone Encounter (Signed)
Referral has been placed. Spoke with the patient. He is aware his referral has been placed.

## 2021-07-13 ENCOUNTER — Other Ambulatory Visit: Payer: Self-pay | Admitting: Cardiology

## 2021-07-17 DIAGNOSIS — E785 Hyperlipidemia, unspecified: Secondary | ICD-10-CM | POA: Insufficient documentation

## 2021-07-17 NOTE — Progress Notes (Signed)
Cardiology Office Note   Date:  07/18/2021   ID:  CARMINO OCAIN, DOB [birth year], MRN [id number]  PCP:  [name], MD  Cardiologist:   [name], MD    Chief Complaint  Patient presents with   Atrial Fibrillation       History of Present Illness: Cory [name] is a 75 y.o. male who presents for evaluation of PAF.  Since I last saw him he has had his lower extremity surgery.  We did well with this.  He relieved the left leg pain that he was having.  He still smoking a few cigarettes.  However, he is doing some walking and yard work.  He denies any cardiovascular symptoms with this.The patient denies any new symptoms such as chest discomfort, neck or arm discomfort. There has been no new shortness of breath, PND or orthopnea. There have been no reported palpitations, presyncope or syncope.    Past Medical History:  Diagnosis Date   Aortic stenosis    a. Moderate by echo 05/2020.   Arthritis    right hip   Cancer ([place])    skin cancer nose, right left, upper left basel cell   Dysrhythmia    a-fib   PAF (paroxysmal atrial fibrillation) ([facility])    Peripheral arterial disease ([facility])    Tobacco abuse     Past Surgical History:  Procedure Laterality Date   ABDOMINAL AORTIC ENDOVASCULAR STENT GRAFT N/A 08/29/2020   Procedure: ABDOMINAL AORTIC ENDOVASCULAR STENT GRAFT;  Surgeon: Elam Dutch, MD;  Location: [place];  Service: Vascular;  Laterality: N/A;   ABDOMINAL AORTOGRAM W/LOWER EXTREMITY Bilateral 06/24/2020   Procedure: ABDOMINAL AORTOGRAM W/LOWER EXTREMITY;  Surgeon: Elam Dutch, MD;  Location: [place] [facility];  Service: Cardiovascular;  Laterality: Bilateral;   BACK SURGERY     2005   CERVICAL SPINE SURGERY     EMBOLECTOMY Right 08/29/2020   Procedure: POPLITEAL AND TIBIAL EMBOLECTOMY;  Surgeon: Elam Dutch, MD;  Location: [place];  Service: Vascular;  Laterality: Right;   EMBOLIZATION Right 06/24/2020   Procedure: EMBOLIZATION;  Surgeon:  Elam Dutch, MD;  Location: [place] [facility];  Service: Cardiovascular;  Laterality: Right;   ENDARTERECTOMY FEMORAL Right 08/29/2020   Procedure: ENDARTERECTOMY FEMORAL;  Surgeon: Elam Dutch, MD;  Location: [place];  Service: Vascular;  Laterality: Right;   EYE SURGERY     FEMORAL ARTERY EXPLORATION Right 06/25/2020   Procedure: RIGHT GROIN EXPLORATION Evacuation of Hematoma  WITH REPAIR OF RIGHT Common FEMORAL ARTERY.;  Surgeon: Angelia Mould, MD;  Location: [place];  Service: Vascular;  Laterality: Right;   FEMORAL-POPLITEAL BYPASS GRAFT Left 11/22/2020   Procedure: LEFT ABOVE KNEE-BELOW KNEE BYPASS TIBIAL PERITONEAL TRUNK REVERSE IPSILATERAL GREATER SAPHENOUS VEIN;  Surgeon: Elam Dutch, MD;  Location: [place];  Service: Vascular;  Laterality: Left;   LEG SURGERY     Trauma   LOWER EXTREMITY ANGIOGRAPHY Left 11/17/2020   Procedure: Lower Extremity Angiography;  Surgeon: Marty Heck, MD;  Location: [place] [facility];  Service: Cardiovascular;  Laterality: Left;   PATCH ANGIOPLASTY Right 08/29/2020   Procedure: PATCH ANGIOPLASTY RIGHT BELOW KNEE POPLITEAL ARTERY AND RIGHT COMMON FEMORAL ARTERY;  Surgeon: Elam Dutch, MD;  Location: [facility];  Service: Vascular;  Laterality: Right;   RETINAL DETACHMENT SURGERY     ULTRASOUND GUIDANCE FOR VASCULAR ACCESS Bilateral 08/29/2020   Procedure: ULTRASOUND GUIDANCE FOR VASCULAR ACCESS;  Surgeon: Elam Dutch, MD;  Location: [facility];  Service: Vascular;  Laterality: Bilateral;     Current Outpatient Medications  Medication Sig Dispense Refill   acetaminophen (TYLENOL) 500 MG tablet Take 500 mg by mouth every 6 (six) hours as needed (for pain.).     cetirizine (ZYRTEC) 10 MG tablet Take 1 tablet (10 mg total) by mouth daily. (Patient taking differently: Take 10 mg by mouth daily as needed for allergies.) 30 tablet 11   cholecalciferol (VITAMIN D) 25 MCG (1000 UNIT) tablet Take 1,000 Units by mouth 2 (two) times daily.      diltiazem (TIADYLT ER) 120 MG 24 hr capsule Take 1 capsule (120 mg total) by mouth daily. SCHEDULE OFFICE VISIT FOR FUTURE REFILLS 30 capsule 0   fluticasone (FLONASE) 50 MCG/ACT nasal spray Place 2 sprays into both nostrils daily. (Patient taking differently: Place 2 sprays into both nostrils daily as needed for allergies.) 16 g 6   metoprolol tartrate (LOPRESSOR) 25 MG tablet Take 1 tablet by mouth twice daily as needed (Patient taking differently: Take 25 mg by mouth 2 (two) times daily as needed (afib).) 30 tablet 0   rivaroxaban (XARELTO) 20 MG TABS tablet TAKE 1 TABLET BY MOUTH ONCE DAILY WITH SUPPER 90 tablet 1   rosuvastatin (CRESTOR) 10 MG tablet Take 1 tablet (10 mg total) by mouth daily. 90 tablet 3   No current facility-administered medications for this visit.    Allergies:   Azithromycin and 2,4-d dimethylamine (amisol)    ROS:  Please see the history of present illness.   Otherwise, review of systems are positive for none.   All other systems are reviewed and negative.    PHYSICAL EXAM: VS:  BP (!) 148/84   Pulse 64   Ht 6\' 4"  (1.93 m)   Wt 229 lb (103.9 kg)   SpO2 96%   BMI 27.87 kg/m  , BMI Body mass index is 27.87 kg/m.  GENERAL:  Well appearing NECK:  No jugular venous distention, waveform within normal limits, carotid upstroke brisk and symmetric, no bruits, no thyromegaly LUNGS:  Clear to auscultation bilaterally CHEST:  Unremarkable HEART:  PMI not displaced or sustained,S1 and S2 within normal limits, no S3, no S4, no clicks, no rubs, 2 out of 6 apical systolic murmur early peaking, no diastolic murmurs murmurs ABD:  Flat, positive bowel sounds normal in frequency in pitch, no bruits, no rebound, no guarding, no midline pulsatile mass, no hepatomegaly, no splenomegaly EXT:  2 plus pulses upper pulses, absent dorsalis pedis and posterior tibialis, bilateral left greater than right lower extremity edema, no cyanosis no clubbing  EKG:  EKG is   ordered  today. Sinus bradycardia, rate 64, left axis deviation, left anterior fascicular block, intervals within normal limits, no acute ST-T wave changes.  Recent Labs: 08/29/2020: Magnesium 1.9 11/22/2020: ALT 16 11/23/2020: BUN 10; Hemoglobin 12.2; Platelets 202; Potassium 4.2; Sodium 134 05/29/2021: Creatinine, Ser 0.90    Lipid Panel    Component Value Date/Time   CHOL 108 06/26/2020 0540   TRIG 44 06/26/2020 0540   HDL 49 06/26/2020 0540   CHOLHDL 2.2 06/26/2020 0540   VLDL 9 06/26/2020 0540   LDLCALC 50 06/26/2020 0540      Wt Readings from Last 3 Encounters:  07/18/21 229 lb (103.9 kg)  04/26/21 227 lb 11.2 oz (103.3 kg)  03/16/21 225 lb 4.8 oz (102.2 kg)      Other studies Reviewed: Additional studies/ records that were reviewed today include:   Labs and PV studies  Review of the above records demonstrates:  See below   ASSESSMENT AND PLAN:  ATRIAL FIB:      He's having very few paroxysms. Mr. TAISHAUN LEVELS has a CHA2DS2 - VASc score of 3.  No change in therapy.   We will check a CBC.  TOBACCO:  He is still smoking 3 to 4 cigarettes/day and we talked about the need to quit smoking completely.  PVD:    He is status post left popliteal artery bypass.  We will continue with aggressive risk reduction.  HTN:    This is controlled.  No change in therapy.  He said it is in the 130s over 70s at home though slightly high today.   AS:    This was moderate in 2021.  I will check an echocardiogram in October.   DYSLIPIDEMIA:    His lipids are excellent last year but he is overdue and will get follow-up lipid profile.   Current medicines are reviewed at length with the patient today.  The patient does not have concerns regarding medicines.  The following changes have been made: None.  Labs/ tests ordered today include:  None  Orders Placed This Encounter  Procedures   Lipid panel   CBC with Differential/Platelet   EKG 12-Lead   ECHOCARDIOGRAM COMPLETE       Disposition:   FU with me in 12 months.      Signed, [name], MD  07/18/2021 4:52 PM    [facility]

## 2021-07-18 ENCOUNTER — Encounter: Payer: Self-pay | Admitting: Cardiology

## 2021-07-18 ENCOUNTER — Other Ambulatory Visit: Payer: Self-pay

## 2021-07-18 ENCOUNTER — Ambulatory Visit (INDEPENDENT_AMBULATORY_CARE_PROVIDER_SITE_OTHER): Payer: Medicare HMO | Admitting: Cardiology

## 2021-07-18 VITALS — BP 148/84 | HR 64 | Ht 76.0 in | Wt 229.0 lb

## 2021-07-18 DIAGNOSIS — I350 Nonrheumatic aortic (valve) stenosis: Secondary | ICD-10-CM

## 2021-07-18 DIAGNOSIS — Z720 Tobacco use: Secondary | ICD-10-CM | POA: Diagnosis not present

## 2021-07-18 DIAGNOSIS — I739 Peripheral vascular disease, unspecified: Secondary | ICD-10-CM | POA: Diagnosis not present

## 2021-07-18 DIAGNOSIS — E785 Hyperlipidemia, unspecified: Secondary | ICD-10-CM | POA: Diagnosis not present

## 2021-07-18 DIAGNOSIS — I10 Essential (primary) hypertension: Secondary | ICD-10-CM

## 2021-07-18 DIAGNOSIS — I480 Paroxysmal atrial fibrillation: Secondary | ICD-10-CM | POA: Diagnosis not present

## 2021-07-18 MED ORDER — RIVAROXABAN 20 MG PO TABS: 20.0000 mg | ORAL_TABLET | Freq: Every day | ORAL | 0 refills | Status: DC

## 2021-07-18 NOTE — Patient Instructions (Signed)
Medication Instructions:  Your physician recommends that you continue on your current medications as directed. Please refer to the Current Medication list given to you today.  *If you need a refill on your cardiac medications before your next appointment, please call your pharmacy*   Lab Work: Your physician recommends that you return for lab work in:  Come back: Lipids, CBC - please come fasting If you have labs (blood work) drawn today and your tests are completely normal, you will receive your results only by: MyChart Message (if you have MyChart) OR A paper copy in the mail If you have any lab test that is abnormal or we need to change your treatment, we will call you to review the results.   Testing/Procedures: Your physician has requested that you have an echocardiogram. Echocardiography is a painless test that uses sound waves to create images of your heart. It provides your doctor with information about the size and shape of your heart and how well your hearts chambers and valves are working. This procedure takes approximately one hour. There are no restrictions for this procedure.   Follow-Up: At [facility], you and your health needs are our priority.  As part of our continuing mission to provide you with exceptional heart care, we have created designated Provider Care Teams.  These Care Teams include your primary Cardiologist (physician) and Advanced Practice Providers (APPs -  Physician Assistants and Nurse Practitioners) who all work together to provide you with the care you need, when you need it.  We recommend signing up for the patient portal called "MyChart".  Sign up information is provided on this After Visit Summary.  MyChart is used to connect with patients for Virtual Visits (Telemedicine).  Patients are able to view lab/test results, encounter notes, upcoming appointments, etc.  Non-urgent messages can be sent to your provider as well.   To learn more about what you  can do with MyChart, go to [email or web address].    Your next appointment:   1 year(s)  The format for your next appointment:   In Person  Provider:   [name], MD    Other Instructions

## 2021-08-15 DIAGNOSIS — J312 Chronic pharyngitis: Secondary | ICD-10-CM | POA: Insufficient documentation

## 2021-08-27 IMAGING — CT CT CTA ABD/PEL W/CM AND/OR W/O CM
1 of 4 series · 12 of 32 positions shown, 17 images · IV contrast (APPLIED)
Comparison: 05/31/2020

CLINICAL DATA: AAA stent graft repair

EXAM:
CT ANGIOGRAPHY ABDOMEN AND PELVIS WITH CONTRAST AND WITHOUT CONTRAST
TECHNIQUE: Multidetector CT imaging of the abdomen and pelvis was performed
using the standard protocol during bolus administration of
intravenous contrast. Multiplanar reconstructed images and MIPs were
obtained and reviewed to evaluate the vascular anatomy.
CONTRAST:  75mL 7RDAW7-I05 IOPAMIDOL ([id number]-I05) INJECTION 76%

[Series 6: angio · axial · 0.82mm/px · z∈[-444,+4]mm · 12 of 254 slices shown, 17 images]
[im 15/254  soft-tissue]
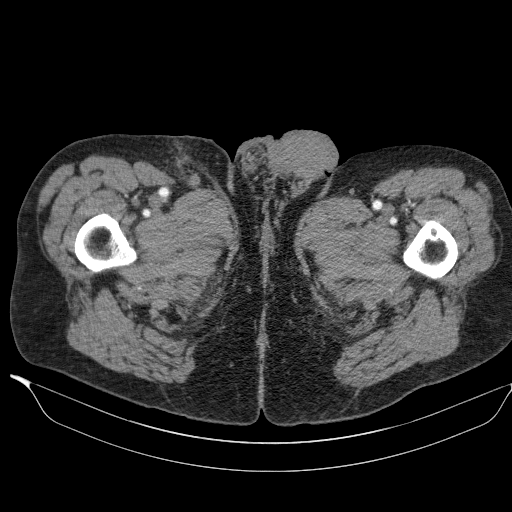
[im 15/254  bone]
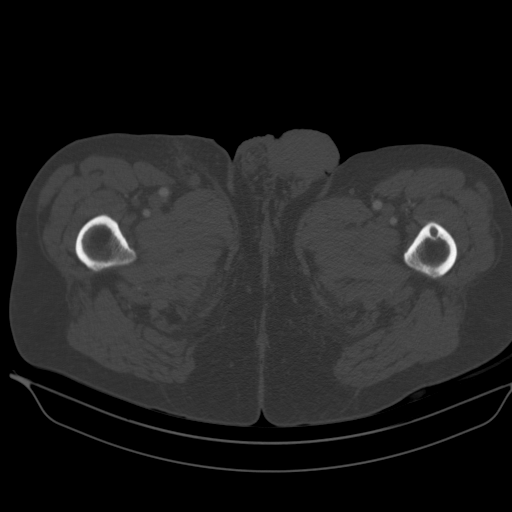
[im 43/254  soft-tissue]
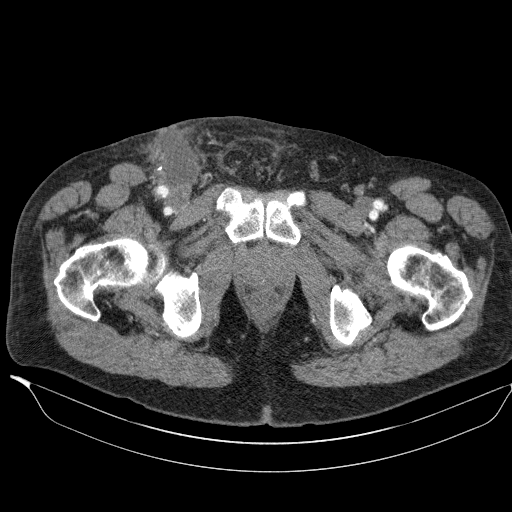
[im 57/254  soft-tissue]
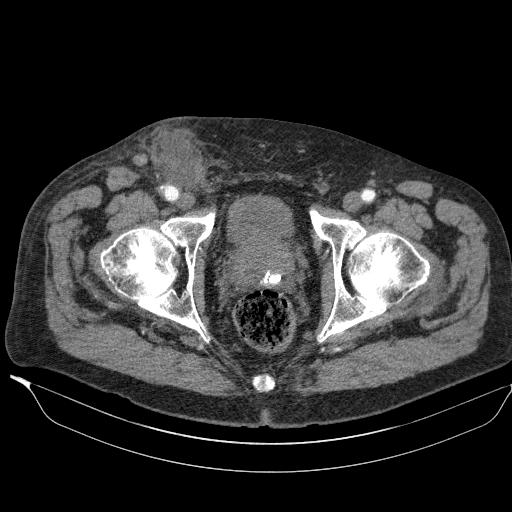
[im 85/254  soft-tissue]
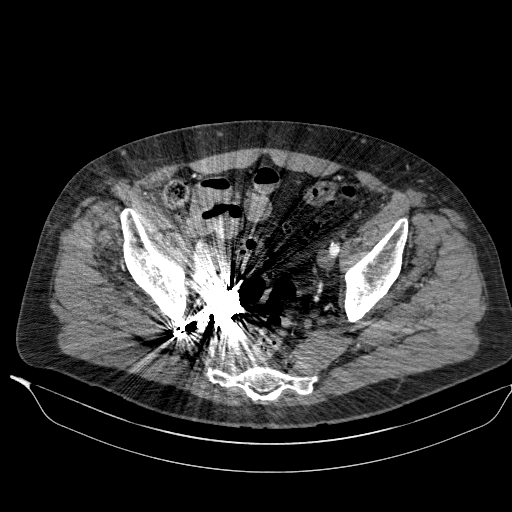
[im 99/254  soft-tissue]
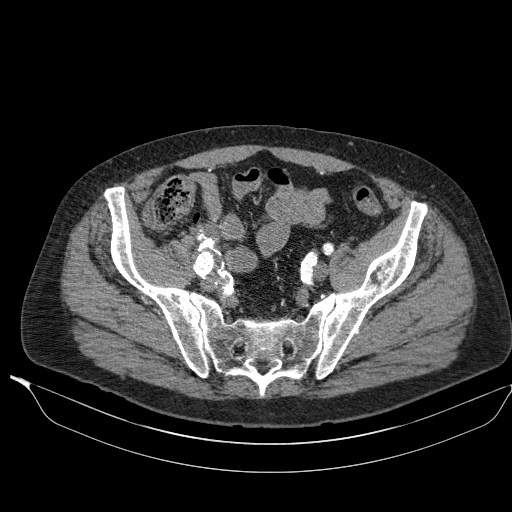
[im 127/254  soft-tissue]
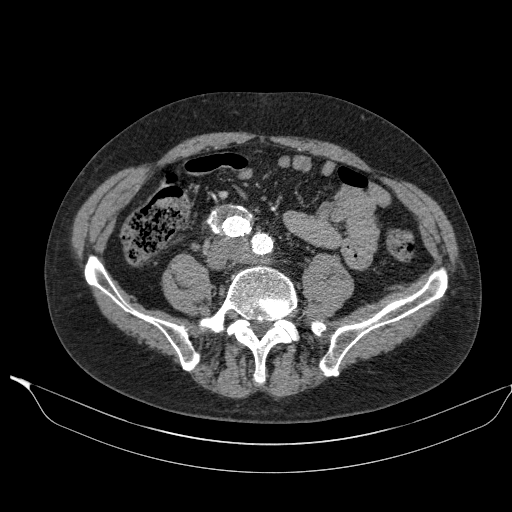
[im 155/254  soft-tissue]
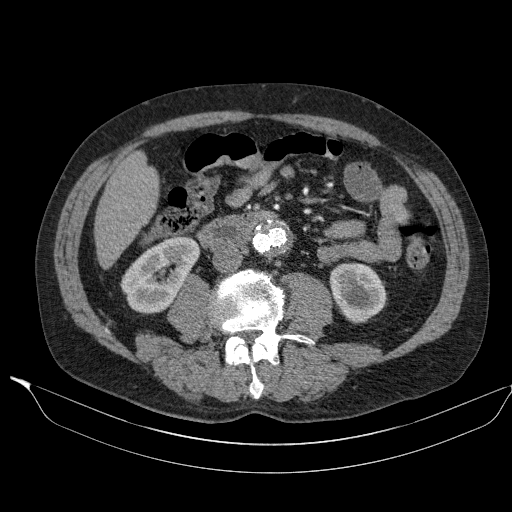
[im 169/254  soft-tissue]
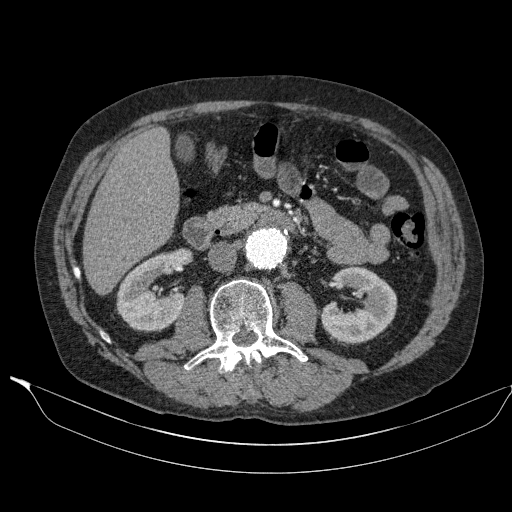
[im 197/254  soft-tissue]
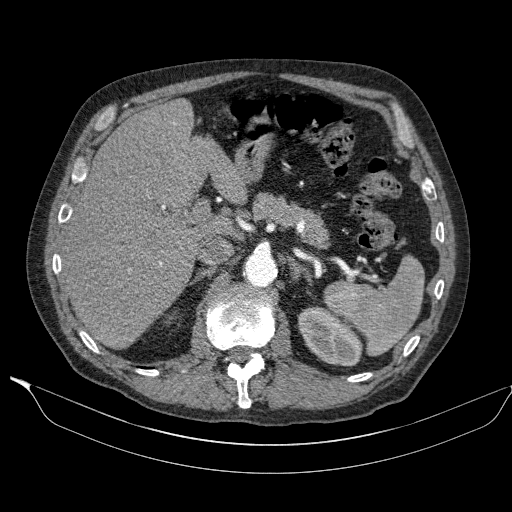
[im 197/254  lung]
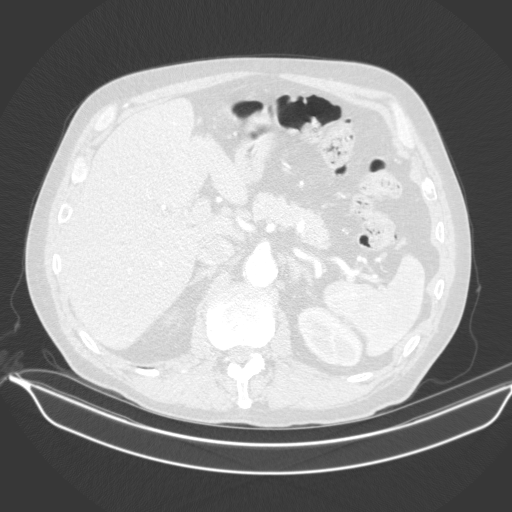
[im 197/254  bone]
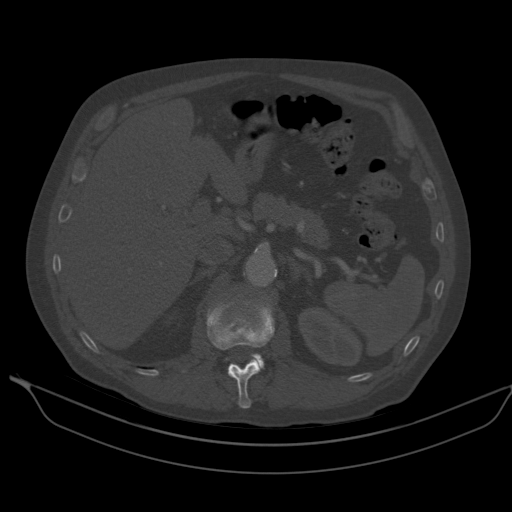
[im 211/254  soft-tissue]
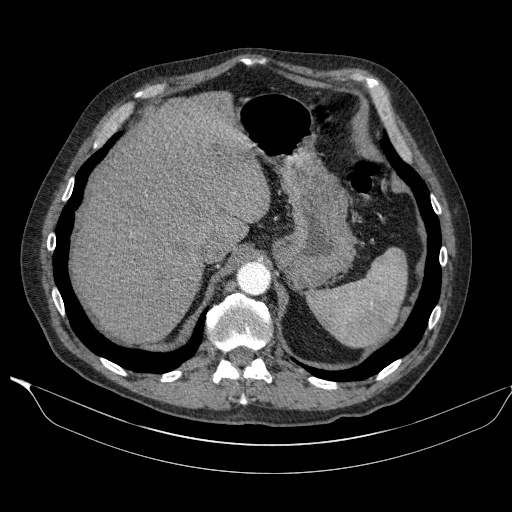
[im 211/254  lung]
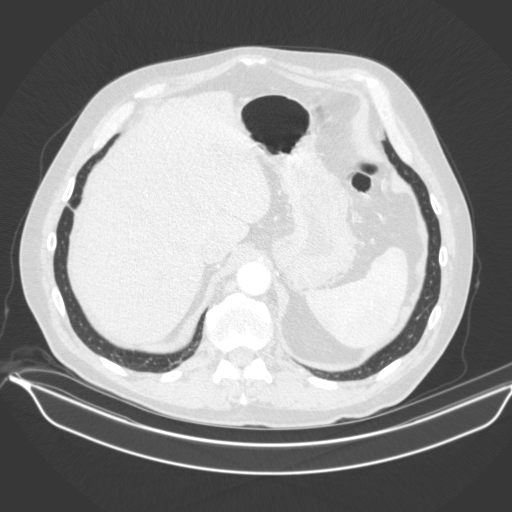
[im 225/254  lung]
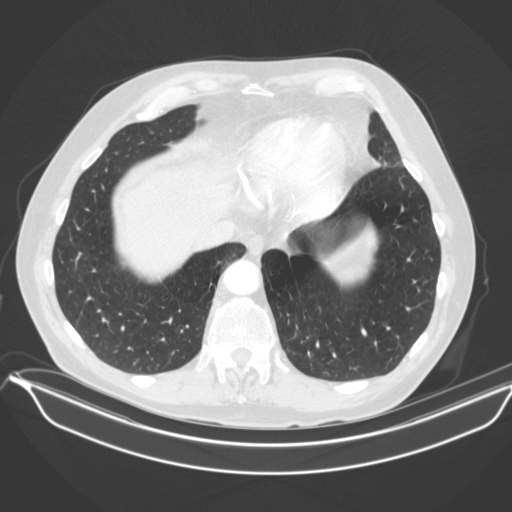
[im 239/254  soft-tissue]
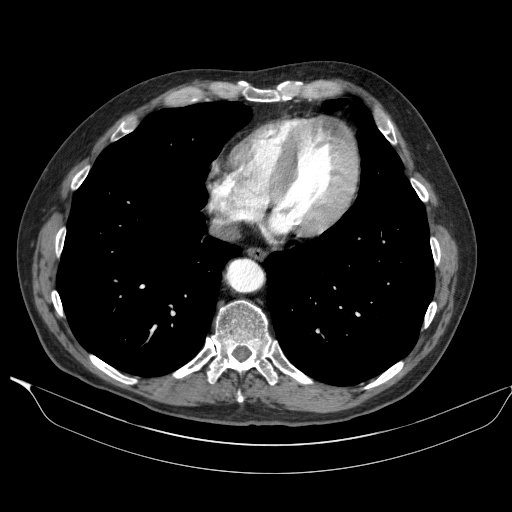
[im 239/254  lung]
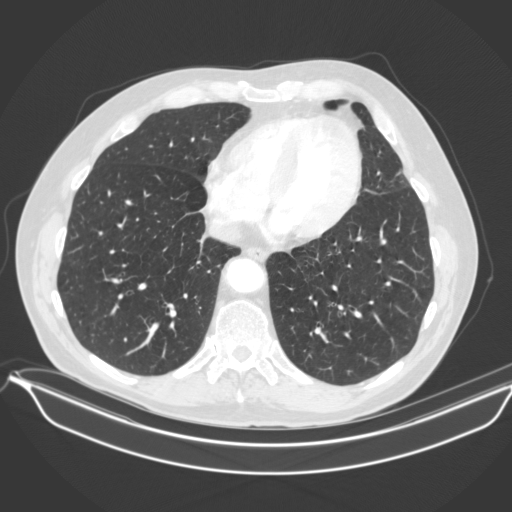

[12 of 32 positions shown; findings below may reference images not displayed]

FINDINGS: VASCULAR

Aorta: Patient is status post aortic aneurysm stent graft repair.
Stent graft extends below the renal arteries into the left common
iliac artery and the right external iliac artery. Bifurcated stent
graft is patent. Measuring at similar levels, native aneurysm sac
diameter is slightly smaller measuring 3.3 cm, previously 3.7 cm. No
visualized endoleak. No evidence of retroperitoneal hemorrhage or
hematoma.

Celiac: Remains patent including its branches

SMA: Remains patent including its branches replaced right hepatic
artery off the SMA proximally.

Renals: Main renal arteries and accessory right lower pole renal
artery all remain patent.

IMA: Appears chronically occluded

Inflow: Again, the bifurcated stent graft extends into the left
common iliac artery and the right external iliac artery. Right iliac
aneurysm measures 4.6 cm but appears excluded. No endoleak
appreciated within the native aneurysm sac. Embolization has been
performed of a right internal iliac aneurysm as well appearing
occluded. Left internal iliac artery remains patent.

No inflow disease or occlusion.

Proximal Outflow: Common femoral, proximal profunda femoral, and
proximal superficial femoral arteries are all visualized and patent.

Superficial right inguinal ill-defined superficial fluid collection
measures approximately 5.2 x 4.3 cm, image 208 series 6 suspicious
for postoperative seroma, hematoma, lymphocele. Less likely abscess.
No vascular enhancement to suggest pseudoaneurysm.

Veins: No central Oderfliw process.

Review of the MIP images confirms the above findings.

NON-VASCULAR

Lower chest: No acute finding.

Hepatobiliary: No focal liver abnormality is seen. No gallstones,
gallbladder wall thickening, or biliary dilatation.

Pancreas: Unremarkable. No pancreatic ductal dilatation or
surrounding inflammatory changes.

Spleen: Normal in size without focal abnormality.

Adrenals/Urinary Tract: Normal adrenal glands. Left kidney lower
pole hypodense cyst measures 2.5 cm. No renal obstruction or
hydronephrosis. Bladder collapsed.

Stomach/Bowel: Negative for bowel obstruction, significant
dilatation, ileus, or free air. Normal appendix visualized. Colonic
diverticulosis the sigmoid.

Free fluid, fluid collection, hemorrhage, hematoma abscess or
ascites.

Lymphatic: Prominent bilateral external iliac lymph nodes noted,
short axis measurements 12 mm on the right.

Reproductive: Prostate gland is enlarged with calcification.
Symmetric seminal vesicles.

Other: Intact abdominal.  No hernia.

Musculoskeletal: Degenerative changes of the spine. No acute osseous
finding. Benign left iliac crest bone lesion as before.
IMPRESSION: VASCULAR

Status post AAA repair and right iliac aneurysm repair with a
bifurcated endograft. Aneurysms appear excluded. Negative for
endoleak.

No other acute intra-abdominal or pelvic vascular finding.

NON-VASCULAR

Postoperative right inguinal superficial 5.2 cm fluid collection
over the femoral vessels may represent seroma, lymphocele, less
likely hematoma or abscess.

No other acute intra-abdominopelvic finding.

## 2021-09-07 ENCOUNTER — Other Ambulatory Visit: Payer: Self-pay | Admitting: Cardiology

## 2021-10-02 ENCOUNTER — Telehealth: Payer: Self-pay | Admitting: Family Medicine

## 2021-10-02 NOTE — Chronic Care Management (AMB) (Signed)
°  Chronic Care Management   Outreach Note  10/02/2021 Name: Cory [name] MRN: [id number] DOB: October 10, 1945  Referred by: [name], MD Reason for referral : No chief complaint on file.   An unsuccessful telephone outreach was attempted today. The patient was referred to the pharmacist for assistance with care management and care coordination.   Follow Up Plan:   Tatjana Dellinger Upstream Scheduler

## 2021-10-04 ENCOUNTER — Telehealth: Payer: Self-pay | Admitting: Family Medicine

## 2021-10-04 NOTE — Chronic Care Management (AMB) (Signed)
?  Chronic Care Management  ? ?Outreach Note ? ?10/04/2021 ?Name: MAJED PELLEGRIN MRN: [id number] DOB: 1946/02/08 ? ?Referred by: [name], MD ?Reason for referral : No chief complaint on file. ? ? ?A second unsuccessful telephone outreach was attempted today. The patient was referred to pharmacist for assistance with care management and care coordination. ? ?Follow Up Plan:  ? ?Tatjana Dellinger ?Upstream Scheduler  ?

## 2021-10-09 ENCOUNTER — Telehealth: Payer: Self-pay | Admitting: Family Medicine

## 2021-10-09 NOTE — Chronic Care Management (AMB) (Signed)
?  Chronic Care Management  ? ?Outreach Note ? ?10/09/2021 ?Name: Cory [name] MRN: [id number] DOB: [birth year] ? ?Referred by: [name], MD ?Reason for referral : No chief complaint on file. ? ? ?Third unsuccessful telephone outreach was attempted today. The patient was referred to the pharmacist for assistance with care management and care coordination.  ? ?Follow Up Plan:  ? ?Tatjana Dellinger ?Upstream Scheduler  ?

## 2021-10-12 DIAGNOSIS — F17210 Nicotine dependence, cigarettes, uncomplicated: Secondary | ICD-10-CM | POA: Diagnosis not present

## 2021-10-12 DIAGNOSIS — J312 Chronic pharyngitis: Secondary | ICD-10-CM | POA: Diagnosis not present

## 2021-10-12 DIAGNOSIS — J029 Acute pharyngitis, unspecified: Secondary | ICD-10-CM | POA: Diagnosis not present

## 2021-10-16 ENCOUNTER — Other Ambulatory Visit: Payer: Self-pay | Admitting: Otolaryngology

## 2021-10-16 DIAGNOSIS — J312 Chronic pharyngitis: Secondary | ICD-10-CM

## 2021-10-27 ENCOUNTER — Telehealth: Payer: Self-pay | Admitting: Family Medicine

## 2021-10-27 NOTE — Progress Notes (Signed)
?  Chronic Care Management  ? ?Note ? ?10/27/2021 ?Name: Cory [name] MRN: [id number] DOB: Dec 21, 1945 ? ?Cory [name] is a 76 y.o. year old male who is a primary care patient of Burchette, Alinda Sierras, MD. I reached out to [name] by phone today in response to a referral sent by [name] PCP, [name], MD.  ? ?[name] was given information about Chronic Care Management services today including:  ?CCM service includes personalized support from designated clinical staff supervised by his physician, including individualized plan of care and coordination with other care providers ?24/7 contact phone numbers for assistance for urgent and routine care needs. ?Service will only be billed when office clinical staff spend 20 minutes or more in a month to coordinate care. ?Only one practitioner may furnish and bill the service in a calendar month. ?The patient may stop CCM services at any time (effective at the end of the month) by phone call to the office staff. ? ? ?Patient agreed to services and verbal consent obtained.  ? ?Follow up plan: ? ? ?Tatjana Dellinger ?Upstream Scheduler  ?

## 2021-11-03 ENCOUNTER — Other Ambulatory Visit: Payer: Self-pay | Admitting: *Deleted

## 2021-11-03 DIAGNOSIS — Z8679 Personal history of other diseases of the circulatory system: Secondary | ICD-10-CM

## 2021-11-03 DIAGNOSIS — I739 Peripheral vascular disease, unspecified: Secondary | ICD-10-CM

## 2021-11-08 ENCOUNTER — Ambulatory Visit
Admission: RE | Admit: 2021-11-08 | Discharge: 2021-11-08 | Disposition: A | Discharge status: Home / Self Care | Payer: Medicare HMO | Source: Ambulatory Visit | Attending: Otolaryngology | Admitting: Otolaryngology

## 2021-11-08 DIAGNOSIS — J029 Acute pharyngitis, unspecified: Secondary | ICD-10-CM | POA: Diagnosis not present

## 2021-11-08 DIAGNOSIS — I6523 Occlusion and stenosis of bilateral carotid arteries: Secondary | ICD-10-CM | POA: Diagnosis not present

## 2021-11-08 DIAGNOSIS — J312 Chronic pharyngitis: Secondary | ICD-10-CM

## 2021-11-08 DIAGNOSIS — M47812 Spondylosis without myelopathy or radiculopathy, cervical region: Secondary | ICD-10-CM | POA: Diagnosis not present

## 2021-11-08 DIAGNOSIS — J432 Centrilobular emphysema: Secondary | ICD-10-CM | POA: Diagnosis not present

## 2021-11-08 MED ORDER — IOPAMIDOL (ISOVUE-300) INJECTION 61%
75.0000 mL | Freq: Once | INTRAVENOUS | Status: AC | PRN
Start: 2021-11-08 — End: 2021-11-08
  Administered 2021-11-08: 75 mL via INTRAVENOUS

## 2021-11-09 ENCOUNTER — Ambulatory Visit (HOSPITAL_COMMUNITY)
Admission: RE | Admit: 2021-11-09 | Discharge: 2021-11-09 | Disposition: A | Discharge status: Home / Self Care | Payer: Medicare HMO | Source: Ambulatory Visit | Attending: Physician Assistant | Admitting: Physician Assistant

## 2021-11-09 ENCOUNTER — Ambulatory Visit (INDEPENDENT_AMBULATORY_CARE_PROVIDER_SITE_OTHER)
Admission: RE | Admit: 2021-11-09 | Discharge: 2021-11-09 | Disposition: A | Discharge status: Home / Self Care | Payer: Medicare HMO | Source: Ambulatory Visit | Attending: Physician Assistant | Admitting: Physician Assistant

## 2021-11-09 ENCOUNTER — Ambulatory Visit: Payer: Medicare HMO | Admitting: Physician Assistant

## 2021-11-09 VITALS — BP 137/85 | HR 67 | Temp 98.4°F | Resp 20 | Ht 76.0 in | Wt 220.0 lb

## 2021-11-09 DIAGNOSIS — I739 Peripheral vascular disease, unspecified: Secondary | ICD-10-CM

## 2021-11-09 DIAGNOSIS — Z9889 Other specified postprocedural states: Secondary | ICD-10-CM | POA: Diagnosis not present

## 2021-11-09 DIAGNOSIS — Z8679 Personal history of other diseases of the circulatory system: Secondary | ICD-10-CM | POA: Diagnosis not present

## 2021-11-09 DIAGNOSIS — I723 Aneurysm of iliac artery: Secondary | ICD-10-CM

## 2021-11-09 NOTE — Progress Notes (Signed)
?Office Note  ? ? ? ?CC:  follow up ?Requesting Provider:  [name], MD ? ?HPI: Cory [name] is a 76 y.o. ([birth year]) male who presents for surveillance of EVAR and PAD.  He underwent left above-knee to below-knee popliteal artery bypass due to an occluded popliteal artery aneurysm by Dr. Oneida Alar on 11/22/2020.  He denies any claudication, rest pain, or nonhealing wounds of bilateral lower extremities. ? ?Surgical history also significant for EVAR for AAA and right common iliac artery aneurysm in January 2022 involving cutdown and repair of right common femoral artery as well as right popliteal and tibial embolectomy with patch angioplasty.  He denies any new or changing abdominal or back pain.  He is on Xarelto for atrial fibrillation. ? ?Past Medical History:  ?Diagnosis Date  ? Aortic stenosis   ? a. Moderate by echo 05/2020.  ? Arthritis   ? right hip  ? Cancer [facility])   ? skin cancer nose, right left, upper left basel cell  ? Dysrhythmia   ? a-fib  ? PAF (paroxysmal atrial fibrillation) ([place])   ? Peripheral arterial disease ([place])   ? Tobacco abuse   ? ? ?Past Surgical History:  ?Procedure Laterality Date  ? ABDOMINAL AORTIC ENDOVASCULAR STENT GRAFT N/A 08/29/2020  ? Procedure: ABDOMINAL AORTIC ENDOVASCULAR STENT GRAFT;  Surgeon: Elam Dutch, MD;  Location: [facility] OR;  Service: Vascular;  Laterality: N/A;  ? ABDOMINAL AORTOGRAM W/LOWER EXTREMITY Bilateral 06/24/2020  ? Procedure: ABDOMINAL AORTOGRAM W/LOWER EXTREMITY;  Surgeon: Elam Dutch, MD;  Location: [place] CV LAB;  Service: Cardiovascular;  Laterality: Bilateral;  ? BACK SURGERY    ? 2005  ? CERVICAL SPINE SURGERY    ? EMBOLECTOMY Right 08/29/2020  ? Procedure: POPLITEAL AND TIBIAL EMBOLECTOMY;  Surgeon: Elam Dutch, MD;  Location: [place];  Service: Vascular;  Laterality: Right;  ? EMBOLIZATION Right 06/24/2020  ? Procedure: EMBOLIZATION;  Surgeon: Elam Dutch, MD;  Location: [place] CV LAB;  Service: Cardiovascular;   Laterality: Right;  ? ENDARTERECTOMY FEMORAL Right 08/29/2020  ? Procedure: ENDARTERECTOMY FEMORAL;  Surgeon: Elam Dutch, MD;  Location: [place];  Service: Vascular;  Laterality: Right;  ? EYE SURGERY    ? FEMORAL ARTERY EXPLORATION Right 06/25/2020  ? Procedure: RIGHT GROIN EXPLORATION Evacuation of Hematoma  WITH REPAIR OF RIGHT Common FEMORAL ARTERY.;  Surgeon: Angelia Mould, MD;  Location: [place];  Service: Vascular;  Laterality: Right;  ? FEMORAL-POPLITEAL BYPASS GRAFT Left 11/22/2020  ? Procedure: LEFT ABOVE KNEE-BELOW KNEE BYPASS TIBIAL PERITONEAL TRUNK REVERSE IPSILATERAL GREATER SAPHENOUS VEIN;  Surgeon: Elam Dutch, MD;  Location: [place];  Service: Vascular;  Laterality: Left;  ? LEG SURGERY    ? Trauma  ? LOWER EXTREMITY ANGIOGRAPHY Left 11/17/2020  ? Procedure: Lower Extremity Angiography;  Surgeon: Marty Heck, MD;  Location: [place] CV LAB;  Service: Cardiovascular;  Laterality: Left;  ? PATCH ANGIOPLASTY Right 08/29/2020  ? Procedure: PATCH ANGIOPLASTY RIGHT BELOW KNEE POPLITEAL ARTERY AND RIGHT COMMON FEMORAL ARTERY;  Surgeon: Elam Dutch, MD;  Location: [facility] OR;  Service: Vascular;  Laterality: Right;  ? RETINAL DETACHMENT SURGERY    ? ULTRASOUND GUIDANCE FOR VASCULAR ACCESS Bilateral 08/29/2020  ? Procedure: ULTRASOUND GUIDANCE FOR VASCULAR ACCESS;  Surgeon: Elam Dutch, MD;  Location: [place];  Service: Vascular;  Laterality: Bilateral;  ? ? ?Social History  ? ?Socioeconomic History  ? Marital status: Married  ?  Spouse name: Not on file  ?  Number of children: 5  ? Years of education: Not on file  ? Highest education level: Not on file  ?Occupational History  ? Not on file  ?Tobacco Use  ? Smoking status: Every Day  ?  Packs/day: 0.50  ?  Years: 50.00  ?  Pack years: 25.00  ?  Types: Cigarettes  ?  Passive exposure: Never  ? Smokeless tobacco: Never  ?Vaping Use  ? Vaping Use: Never used  ?Substance and Sexual Activity  ? Alcohol use: Yes  ?  Alcohol/week: 0.0  standard drinks  ?  Comment: 4 to 5 times a week; beer, wine and liquor   ? Drug use: No  ? Sexual activity: Not on file  ?Other Topics Concern  ? Not on file  ?Social History Narrative  ? Scores reading tests.  Lives with wife.    ? ?Social Determinants of Health  ? ?Financial Resource Strain: Low Risk   ? Difficulty of Paying Living Expenses: Not hard at all  ?Food Insecurity: No Food Insecurity  ? Worried About Charity fundraiser in the Last Year: Never true  ? Ran Out of Food in the Last Year: Never true  ?Transportation Needs: No Transportation Needs  ? Lack of Transportation (Medical): No  ? Lack of Transportation (Non-Medical): No  ?Physical Activity: Insufficiently Active  ? Days of Exercise per Week: 5 days  ? Minutes of Exercise per Session: 20 min  ?Stress: No Stress Concern Present  ? Feeling of Stress : Not at all  ?Social Connections: Moderately Isolated  ? Frequency of Communication with Friends and Family: Twice a week  ? Frequency of Social Gatherings with Friends and Family: Twice a week  ? Attends Religious Services: Never  ? Active Member of Clubs or Organizations: No  ? Attends Archivist Meetings: Never  ? Marital Status: Married  ?Intimate Partner Violence: Not At Risk  ? Fear of Current or Ex-Partner: No  ? Emotionally Abused: No  ? Physically Abused: No  ? Sexually Abused: No  ? ? ?Family History  ?Problem Relation Age of Onset  ? Ovarian cancer Mother   ? Alcoholism Father   ? Breast cancer Sister   ? Ovarian cancer Sister   ? ? ?Current Outpatient Medications  ?Medication Sig Dispense Refill  ? acetaminophen (TYLENOL) 500 MG tablet Take 500 mg by mouth every 6 (six) hours as needed (for pain.).    ? cetirizine (ZYRTEC) 10 MG tablet Take 1 tablet (10 mg total) by mouth daily. (Patient taking differently: Take 10 mg by mouth daily as needed for allergies.) 30 tablet 11  ? cholecalciferol (VITAMIN D) 25 MCG (1000 UNIT) tablet Take 1,000 Units by mouth 2 (two) times daily.    ?  diltiazem (TIADYLT ER) 120 MG 24 hr capsule TAKE 1 CAPSULE EVERY DAY (SCHEDULE OFFICE VISIT FOR FUTURE REFILLS) 60 capsule 0  ? fluticasone (FLONASE) 50 MCG/ACT nasal spray Place 2 sprays into both nostrils daily. (Patient taking differently: Place 2 sprays into both nostrils daily as needed for allergies.) 16 g 6  ? metoprolol tartrate (LOPRESSOR) 25 MG tablet Take 1 tablet by mouth twice daily as needed (Patient taking differently: Take 25 mg by mouth 2 (two) times daily as needed (afib).) 30 tablet 0  ? omeprazole (PRILOSEC) 40 MG capsule Take by mouth.    ? rivaroxaban (XARELTO) 20 MG TABS tablet Take 1 tablet (20 mg total) by mouth daily with supper. 28 tablet 0  ? rosuvastatin (CRESTOR)  10 MG tablet Take 1 tablet (10 mg total) by mouth daily. 90 tablet 3  ? ?No current facility-administered medications for this visit.  ? ? ?Allergies  ?Allergen Reactions  ? Azithromycin Diarrhea  ? 2,4-D Dimethylamine Other (See Comments) and Itching  ? ? ? ?REVIEW OF SYSTEMS:  ? ?'[X]'$  denotes positive finding, '[ ]'$  denotes negative finding ?Cardiac  Comments:  ?Chest pain or chest pressure:    ?Shortness of breath upon exertion:    ?Short of breath when lying flat:    ?Irregular heart rhythm:    ?    ?Vascular    ?Pain in calf, thigh, or hip brought on by ambulation:    ?Pain in feet at night that wakes you up from your sleep:     ?Blood clot in your veins:    ?Leg swelling:     ?    ?Pulmonary    ?Oxygen at home:    ?Productive cough:     ?Wheezing:     ?    ?Neurologic    ?Sudden weakness in arms or legs:     ?Sudden numbness in arms or legs:     ?Sudden onset of difficulty speaking or slurred speech:    ?Temporary loss of vision in one eye:     ?Problems with dizziness:     ?    ?Gastrointestinal    ?Blood in stool:     ?Vomited blood:     ?    ?Genitourinary    ?Burning when urinating:     ?Blood in urine:    ?    ?Psychiatric    ?Major depression:     ?    ?Hematologic    ?Bleeding problems:    ?Problems with blood  clotting too easily:    ?    ?Skin    ?Rashes or ulcers:    ?    ?Constitutional    ?Fever or chills:    ? ? ?PHYSICAL EXAMINATION: ? ?Vitals:  ? 11/09/21 1044  ?BP: 137/85  ?Pulse: 67  ?Resp: 20  ?Temp: 98.4 ?F (36.9

## 2021-11-21 DIAGNOSIS — F17210 Nicotine dependence, cigarettes, uncomplicated: Secondary | ICD-10-CM | POA: Diagnosis not present

## 2021-11-21 DIAGNOSIS — R070 Pain in throat: Secondary | ICD-10-CM | POA: Diagnosis not present

## 2021-11-21 DIAGNOSIS — J384 Edema of larynx: Secondary | ICD-10-CM | POA: Diagnosis not present

## 2021-12-01 ENCOUNTER — Ambulatory Visit (INDEPENDENT_AMBULATORY_CARE_PROVIDER_SITE_OTHER): Payer: Medicare HMO

## 2021-12-01 ENCOUNTER — Encounter: Payer: Self-pay | Admitting: Internal Medicine

## 2021-12-01 ENCOUNTER — Ambulatory Visit (INDEPENDENT_AMBULATORY_CARE_PROVIDER_SITE_OTHER): Payer: Medicare HMO | Admitting: Internal Medicine

## 2021-12-01 VITALS — BP 138/76 | HR 63 | Temp 98.8°F | Ht 76.0 in | Wt 223.0 lb

## 2021-12-01 DIAGNOSIS — I10 Essential (primary) hypertension: Secondary | ICD-10-CM | POA: Diagnosis not present

## 2021-12-01 DIAGNOSIS — Z720 Tobacco use: Secondary | ICD-10-CM

## 2021-12-01 DIAGNOSIS — R0781 Pleurodynia: Secondary | ICD-10-CM

## 2021-12-01 DIAGNOSIS — Z043 Encounter for examination and observation following other accident: Secondary | ICD-10-CM | POA: Diagnosis not present

## 2021-12-01 MED ORDER — TRAMADOL HCL 50 MG PO TABS: 50.0000 mg | ORAL_TABLET | Freq: Four times a day (QID) | ORAL | 0 refills | Status: DC | PRN

## 2021-12-01 MED ORDER — HYDROCODONE BIT-HOMATROP MBR 5-1.5 MG/5ML PO SOLN: 5.0000 mL | Freq: Four times a day (QID) | ORAL | 0 refills | Status: AC | PRN

## 2021-12-01 NOTE — Assessment & Plan Note (Signed)
Sp milder accidental fall, no injury except for the left chest wall hematoma - for cxr, left rib films r/o fracture, pneumothorax,  And pain control ?

## 2021-12-01 NOTE — Assessment & Plan Note (Signed)
Pt counseled to quit, pt not ready 

## 2021-12-01 NOTE — Progress Notes (Signed)
Patient ID: Cory [name], male   DOB: 31-Mar-1946, 76 y.o.   MRN: [id number] ? ? ? ?    Chief Complaint: follow up left chest pain ? ?     HPI:  Cory [name] is a 76 y.o. male here after simply wrestling with the 80 lb wet dog trying to dry him off, got off balance and somewhat fell to the left and must have struck a hard edge of something to the left lateral chest wall as the arm and elbow were not affected it seems; Has tender swelling and bruising to the area, wondering about a rib fx.  Pt denies increased sob or doe, wheezing, orthopnea, PND, increased LE swelling, palpitations, dizziness or syncope.   Pt denies polydipsia, polyuria, or new focal neuro s/s.    Pt denies fever, wt loss, night sweats, loss of appetite, or other constitutional symptoms   ?      ?Wt Readings from Last 3 Encounters:  ?12/01/21 223 lb (101.2 kg)  ?11/09/21 220 lb (99.8 kg)  ?07/18/21 229 lb (103.9 kg)  ? ?BP Readings from Last 3 Encounters:  ?12/01/21 138/76  ?11/09/21 137/85  ?07/18/21 (!) 148/84  ? ?      ?Past Medical History:  ?Diagnosis Date  ? Aortic stenosis   ? a. Moderate by echo 05/2020.  ? Arthritis   ? right hip  ? Cancer [facility])   ? skin cancer nose, right left, upper left basel cell  ? Dysrhythmia   ? a-fib  ? PAF (paroxysmal atrial fibrillation) ([place])   ? Peripheral arterial disease ([place])   ? Tobacco abuse   ? ?Past Surgical History:  ?Procedure Laterality Date  ? ABDOMINAL AORTIC ENDOVASCULAR STENT GRAFT N/A 08/29/2020  ? Procedure: ABDOMINAL AORTIC ENDOVASCULAR STENT GRAFT;  Surgeon: Elam Dutch, MD;  Location: [facility] OR;  Service: Vascular;  Laterality: N/A;  ? ABDOMINAL AORTOGRAM W/LOWER EXTREMITY Bilateral 06/24/2020  ? Procedure: ABDOMINAL AORTOGRAM W/LOWER EXTREMITY;  Surgeon: Elam Dutch, MD;  Location: [place] CV LAB;  Service: Cardiovascular;  Laterality: Bilateral;  ? BACK SURGERY    ? 2005  ? CERVICAL SPINE SURGERY    ? EMBOLECTOMY Right 08/29/2020  ? Procedure: POPLITEAL AND TIBIAL EMBOLECTOMY;   Surgeon: Elam Dutch, MD;  Location: [place];  Service: Vascular;  Laterality: Right;  ? EMBOLIZATION Right 06/24/2020  ? Procedure: EMBOLIZATION;  Surgeon: Elam Dutch, MD;  Location: [place] CV LAB;  Service: Cardiovascular;  Laterality: Right;  ? ENDARTERECTOMY FEMORAL Right 08/29/2020  ? Procedure: ENDARTERECTOMY FEMORAL;  Surgeon: Elam Dutch, MD;  Location: [place];  Service: Vascular;  Laterality: Right;  ? EYE SURGERY    ? FEMORAL ARTERY EXPLORATION Right 06/25/2020  ? Procedure: RIGHT GROIN EXPLORATION Evacuation of Hematoma  WITH REPAIR OF RIGHT Common FEMORAL ARTERY.;  Surgeon: Angelia Mould, MD;  Location: [place];  Service: Vascular;  Laterality: Right;  ? FEMORAL-POPLITEAL BYPASS GRAFT Left 11/22/2020  ? Procedure: LEFT ABOVE KNEE-BELOW KNEE BYPASS TIBIAL PERITONEAL TRUNK REVERSE IPSILATERAL GREATER SAPHENOUS VEIN;  Surgeon: Elam Dutch, MD;  Location: [place];  Service: Vascular;  Laterality: Left;  ? LEG SURGERY    ? Trauma  ? LOWER EXTREMITY ANGIOGRAPHY Left 11/17/2020  ? Procedure: Lower Extremity Angiography;  Surgeon: Marty Heck, MD;  Location: [place] CV LAB;  Service: Cardiovascular;  Laterality: Left;  ? PATCH ANGIOPLASTY Right 08/29/2020  ? Procedure: PATCH ANGIOPLASTY RIGHT BELOW KNEE POPLITEAL ARTERY AND RIGHT COMMON FEMORAL ARTERY;  Surgeon: Elam Dutch, MD;  Location: [facility] OR;  Service: Vascular;  Laterality: Right;  ? RETINAL DETACHMENT SURGERY    ? ULTRASOUND GUIDANCE FOR VASCULAR ACCESS Bilateral 08/29/2020  ? Procedure: ULTRASOUND GUIDANCE FOR VASCULAR ACCESS;  Surgeon: Elam Dutch, MD;  Location: [place];  Service: Vascular;  Laterality: Bilateral;  ? ? reports that he has been smoking cigarettes. He has a 25.00 pack-year smoking history. He has never been exposed to tobacco smoke. He has never used smokeless tobacco. He reports current alcohol use. He reports that he does not use drugs. ?family history includes Alcoholism in his father;  Breast cancer in his sister; Ovarian cancer in his mother and sister. ?Allergies  ?Allergen Reactions  ? Azithromycin Diarrhea  ? 2,4-D Dimethylamine Other (See Comments) and Itching  ? ?Current Outpatient Medications on File Prior to Visit  ?Medication Sig Dispense Refill  ? acetaminophen (TYLENOL) 500 MG tablet Take 500 mg by mouth every 6 (six) hours as needed (for pain.).    ? cetirizine (ZYRTEC) 10 MG tablet Take 1 tablet (10 mg total) by mouth daily. (Patient taking differently: Take 10 mg by mouth daily as needed for allergies.) 30 tablet 11  ? cholecalciferol (VITAMIN D) 25 MCG (1000 UNIT) tablet Take 1,000 Units by mouth 2 (two) times daily.    ? diltiazem (TIADYLT ER) 120 MG 24 hr capsule TAKE 1 CAPSULE EVERY DAY (SCHEDULE OFFICE VISIT FOR FUTURE REFILLS) 60 capsule 0  ? fluticasone (FLONASE) 50 MCG/ACT nasal spray Place 2 sprays into both nostrils daily. (Patient taking differently: Place 2 sprays into both nostrils daily as needed for allergies.) 16 g 6  ? metoprolol tartrate (LOPRESSOR) 25 MG tablet Take 1 tablet by mouth twice daily as needed (Patient taking differently: Take 25 mg by mouth 2 (two) times daily as needed (afib).) 30 tablet 0  ? omeprazole (PRILOSEC) 40 MG capsule Take by mouth.    ? rivaroxaban (XARELTO) 20 MG TABS tablet Take 1 tablet (20 mg total) by mouth daily with supper. 28 tablet 0  ? rosuvastatin (CRESTOR) 10 MG tablet Take 1 tablet (10 mg total) by mouth daily. 90 tablet 3  ? ?No current facility-administered medications on file prior to visit.  ? ?     ROS:  All others reviewed and negative. ? ?Objective  ? ?     PE:  BP 138/76 (BP Location: Right Arm, Patient Position: Sitting, Cuff Size: Large)   Pulse 63   Temp 98.8 ?F (37.1 ?C) (Oral)   Ht '6\' 4"'$  (1.93 m)   Wt 223 lb (101.2 kg)   SpO2 95%   BMI 27.14 kg/m?  ? ?              Constitutional: Pt appears in NAD ?              HENT: Head: NCAT.  ?              Right Ear: External ear normal.   ?              Left Ear:  External ear normal.  ?              Eyes: . Pupils are equal, round, and reactive to light. Conjunctivae and EOM are normal ?              Nose: without d/c or deformity ?              Neck: Neck supple. Johney Maine  normal ROM ?              Cardiovascular: Normal rate and regular rhythm.   ?              Pulmonary/Chest: Effort normal and breath sounds without rales or wheezing. Left lateral chest wall with 4 cm oval area hematoma/bruising at approx anterior axillary line at t8-t10 ?              Abd:  Soft, NT, ND, + BS, no organomegaly ?              Neurological: Pt is alert. At baseline orientation, motor grossly intact ?              Skin: Skin is warm. No rashes, no other new lesions, LE edema - none ?              Psychiatric: Pt behavior is normal without agitation  ? ?Micro: none ? ?Cardiac tracings I have personally interpreted today:  none ? ?Pertinent Radiological findings (summarize): none  ? ?Lab Results  ?Component Value Date  ? WBC 8.8 11/23/2020  ? HGB 12.2 (L) 11/23/2020  ? HCT 37.9 (L) 11/23/2020  ? PLT 202 11/23/2020  ? GLUCOSE 155 (H) 11/23/2020  ? CHOL 108 06/26/2020  ? TRIG 44 06/26/2020  ? HDL 49 06/26/2020  ? [place] 50 06/26/2020  ? ALT 16 11/22/2020  ? AST 16 11/22/2020  ? NA 134 (L) 11/23/2020  ? K 4.2 11/23/2020  ? CL 104 11/23/2020  ? CREATININE 0.90 05/29/2021  ? BUN 10 11/23/2020  ? CO2 26 11/23/2020  ? TSH 0.77 03/08/2015  ? PSA 0.97 07/14/2009  ? INR 1.0 11/22/2020  ? ?Assessment/Plan:  ?HILLMAN ATTIG is a 76 y.o. White or Caucasian [1] male with  has a past medical history of Aortic stenosis, Arthritis, Cancer ([place]), Dysrhythmia, PAF (paroxysmal atrial fibrillation) ([place]), Peripheral arterial disease ([place]), and Tobacco abuse. ? ?Rib pain on left side ?Sp milder accidental fall, no injury except for the left chest wall hematoma - for cxr, left rib films r/o fracture, pneumothorax,  And pain control ? ?Tobacco abuse ?Pt counseled to quit, pt not ready ? ?Essential hypertension ?BP  Readings from Last 3 Encounters:  ?12/01/21 138/76  ?11/09/21 137/85  ?07/18/21 (!) 148/84  ? ?Stable, pt to continue medical treatment diltiazem, metoprolol ? ?Followup: Return if symptoms worsen or fail to improve. ? ?

## 2021-12-01 NOTE — Patient Instructions (Signed)
Please take all new medication as prescribed - the pain medication and cough med if needed ? ?Please continue all other medications as before, and refills have been done if requested. ? ?Please have the pharmacy call with any other refills you may need ? ?Please keep your appointments with your specialists as you may have planned ? ?Please go to the XRAY Department in the first floor for the x-ray testing ? ?You will be contacted by phone if any changes need to be made immediately.  Otherwise, you will receive a letter about your results with an explanation, but please check with MyChart first. ? ?Please remember to sign up for MyChart if you have not done so, as this will be important to you in the future with finding out test results, communicating by private email, and scheduling acute appointments online when needed. ? ? ? ? ?

## 2021-12-01 NOTE — Assessment & Plan Note (Signed)
BP Readings from Last 3 Encounters:  ?12/01/21 138/76  ?11/09/21 137/85  ?07/18/21 (!) 148/84  ? ?Stable, pt to continue medical treatment diltiazem, metoprolol ? ?

## 2021-12-07 ENCOUNTER — Telehealth: Payer: Self-pay | Admitting: Pharmacist

## 2021-12-07 NOTE — Chronic Care Management (AMB) (Signed)
? ? ?Chronic Care Management ?Pharmacy Assistant  ? ?Name: Cory [name]  MRN: [id number] DOB: May 05, 1946 ? ?Reason for Encounter: Chart Prep for Initial Encounter with Jeni Salles Clinical Pharmacist on 12/13/21 at 9 am via phone call. ?  ?Conditions to be addressed/monitored: ?Atrial Fibrillation, HTN, Tobacco Use, and PAD ? ?Recent office visits:  ?04/26/21 [name], MD - Patient presented for Sore throat. Prescribed Prednisone 10 mg. ? ?Recent consult visits:  ?12/01/21 Biagio Borg, MD (Internal Med) - Patient presented for Rib pain on left side and other concerns. Prescribed Hydrocodone PRN & Tramadol 50 mg PRN ? ?11/21/21 Hessie Knows, MD (ENT) - Patient presented for Chronic Sore Throat. No medication changes. ? ?11/09/21 Dagoberto Ligas, PA-C (Vasc Surg) - Patient presented for PAD and other concerns. No medication changes. ? ?08/15/21 Remus Loffler, MD (ENT) - Patient presented for Sore Throat. Prescribed Omeprazole 40 mg. ? ?07/18/21 [name], MD (Cardiology) - Patient presented for PAF and other concerns. Prescribed Rivaroxaban 20 mg. Stopped Prednisone 10 mg. ? ?Hospital visits:  ?None in previous 6 months ? ?Medications: ?Outpatient Encounter Medications as of 12/07/2021  ?Medication Sig  ? acetaminophen (TYLENOL) 500 MG tablet Take 500 mg by mouth every 6 (six) hours as needed (for pain.).  ? cetirizine (ZYRTEC) 10 MG tablet Take 1 tablet (10 mg total) by mouth daily. (Patient taking differently: Take 10 mg by mouth daily as needed for allergies.)  ? cholecalciferol (VITAMIN D) 25 MCG (1000 UNIT) tablet Take 1,000 Units by mouth 2 (two) times daily.  ? diltiazem (TIADYLT ER) 120 MG 24 hr capsule TAKE 1 CAPSULE EVERY DAY (SCHEDULE OFFICE VISIT FOR FUTURE REFILLS)  ? fluticasone (FLONASE) 50 MCG/ACT nasal spray Place 2 sprays into both nostrils daily. (Patient taking differently: Place 2 sprays into both nostrils daily as needed for allergies.)  ? HYDROcodone bit-homatropine (HYCODAN)  5-1.5 MG/5ML syrup Take 5 mLs by mouth every 6 (six) hours as needed for up to 10 days.  ? metoprolol tartrate (LOPRESSOR) 25 MG tablet Take 1 tablet by mouth twice daily as needed (Patient taking differently: Take 25 mg by mouth 2 (two) times daily as needed (afib).)  ? omeprazole (PRILOSEC) 40 MG capsule Take by mouth.  ? rivaroxaban (XARELTO) 20 MG TABS tablet Take 1 tablet (20 mg total) by mouth daily with supper.  ? rosuvastatin (CRESTOR) 10 MG tablet Take 1 tablet (10 mg total) by mouth daily.  ? traMADol (ULTRAM) 50 MG tablet Take 1 tablet (50 mg total) by mouth every 6 (six) hours as needed.  ? ?No facility-administered encounter medications on file as of 12/07/2021.  ?Fill History : ?Tiadylt ER 120 mg capsule,extended release 11/02/2021 60  ? ?METOPROLOL TARTRATE '25MG'$  TAB 06/01/2020 15  ? ?OMEPRAZOLE DR '40MG'$   CAP 11/19/2021 30  ? ?XARELTO '20MG'$         TAB 11/19/2021 30  ? ?ROSUVASTATIN '10MG'$  TAB 10/30/2021 90  ? ?TRAMADOL '50MG'$  TAB 12/01/2021 7  ? ?Have you seen any other providers since your last visit? Patient reports he has seen ENT and Cardiology recently. ? ?Any changes in your medications or health? Patient reports none. ? ?Any side effects from any medications? Patient reports none ? ?Do you have an symptoms or problems not managed by your medications? Patient reports none ? ?Any concerns about your health right now? Patient reports none. ? ?Has your provider asked that you check blood pressure, blood sugar, or follow special diet at home? Patient reports he does have a  blood pressure cuff will take a reading today before his appointment on tomorrow. ? ?Can you think of a goal you would like to reach for your health? Patient reports not at this time. ? ?Do you have any problems getting your medications? Patient reports he is happy as far as the pharmacy filling his med's in a timely manner but he does get in the donut hole towards the end of the year making his Xarelto very costly for him. ? ?Is there  anything that you would like to discuss during the appointment? Patient reports none ? ?Patient aware to take blood pressure and have available medications for call. He has confirmed his number to call for the appointment. ? ?Care Gaps: ?Hepatitis C Screening - Overdue ?Zoster Vaccine - Overdue ?TDAP - Overdue ?AWV - 11/22 ?BP- 138/76 (12/01/21) ? ?Star Rating Drugs: ?Rosuvastatin 10 mg - Last filled 10/30/21 90 DS at [place] ? ? ?Ned Clines CMA ?Clinical Pharmacist Assistant ?[phone] ? ?

## 2021-12-12 NOTE — Progress Notes (Signed)
? ?Chronic Care Management ?Pharmacy Note ? ?12/13/2021 ?Name:  Cory [name] MRN:  [id number] DOB:  [birth year] ? ?Summary: ?BP is mostly at goal < 130/80 ?LDL is at goal < 70 ?Pt did feel like omeprazole was helping so he stopped on his own ? ?Recommendations/Changes made from today's visit: ?-Recommended repeat lipid panel as pt is overdue ?-Recommended bringing BP cuff to office to ensure accuracy ? ?Plan: ?Scheduled PCP visit for cholesterol recheck ?Mail Xarelto PAP ?BP assessment in 3 months ?Follow up in 6 months ? ? ?Subjective: ?Cory [name] is an 76 y.o. year old male who is a primary patient of Burchette, Alinda Sierras, MD.  The CCM team was consulted for assistance with disease management and care coordination needs.   ? ?Engaged with patient by telephone for initial visit in response to provider referral for pharmacy case management and/or care coordination services.  ? ?Consent to Services:  ?The patient was given the following information about Chronic Care Management services today, agreed to services, and gave verbal consent: 1. CCM service includes personalized support from designated clinical staff supervised by the primary care provider, including individualized plan of care and coordination with other care providers 2. 24/7 contact phone numbers for assistance for urgent and routine care needs. 3. Service will only be billed when office clinical staff spend 20 minutes or more in a month to coordinate care. 4. Only one practitioner may furnish and bill the service in a calendar month. 5.The patient may stop CCM services at any time (effective at the end of the month) by phone call to the office staff. 6. The patient will be responsible for cost sharing (co-pay) of up to 20% of the service fee (after annual deductible is met). Patient agreed to services and consent obtained. ? ?Patient Care Team: ?[name], MD as PCP - General ?Viona Gilmore, [facility] as Pharmacist (Pharmacist) ? ?Recent office  visits: ?04/26/21 [name], MD - Patient presented for Sore throat. Prescribed Prednisone 10 mg. ? ?Recent consult visits: ?12/01/21 Biagio Borg, MD (Internal Med) - Patient presented for Rib pain on left side and other concerns. Prescribed Hydrocodone PRN & Tramadol 50 mg PRN ?  ?11/21/21 Hessie Knows, MD (ENT) - Patient presented for Chronic Sore Throat. No medication changes. ?  ?11/09/21 Dagoberto Ligas, PA-C (Vasc Surg) - Patient presented for PAD and other concerns. No medication changes. ?  ?08/15/21 Remus Loffler, MD (ENT) - Patient presented for Sore Throat. Prescribed Omeprazole 40 mg. ?  ?07/18/21 [name], MD (Cardiology) - Patient presented for PAF and other concerns. Prescribed Rivaroxaban 20 mg. Stopped Prednisone 10 mg. ?  ? ?Hospital visits: ?None in previous 6 months ? ? ?Objective: ? ?Lab Results  ?Component Value Date  ? CREATININE 0.90 05/29/2021  ? BUN 10 11/23/2020  ? GFR 87.77 04/01/2013  ? GFRNONAA >60 11/23/2020  ? GFRAA 94 05/16/2018  ? NA 134 (L) 11/23/2020  ? K 4.2 11/23/2020  ? CALCIUM 8.1 (L) 11/23/2020  ? CO2 26 11/23/2020  ? GLUCOSE 155 (H) 11/23/2020  ? ? ?Lab Results  ?Component Value Date/Time  ? GFR 87.77 04/01/2013 12:48 PM  ?  ?Last diabetic Eye exam: No results found for: HMDIABEYEEXA  ?Last diabetic Foot exam: No results found for: HMDIABFOOTEX  ? ?Lab Results  ?Component Value Date  ? CHOL 108 06/26/2020  ? HDL 49 06/26/2020  ? [place] 50 06/26/2020  ? TRIG 44 06/26/2020  ? CHOLHDL 2.2 06/26/2020  ? ? ? ?  Latest Ref Rng & Units 11/22/2020  ? 12:40 PM 08/24/2020  ? 11:38 AM 06/26/2020  ?  5:40 AM  ?Hepatic Function  ?Total Protein 6.5 - 8.1 g/dL 6.1   6.8   5.0    ?Albumin 3.5 - 5.0 g/dL 3.4   3.5   2.4    ?AST 15 - 41 U/L 16   16   10     ?ALT 0 - 44 U/L 16   19   13     ?Alk Phosphatase 38 - 126 U/L 43   42   24    ?Total Bilirubin 0.3 - 1.2 mg/dL 0.9   0.6   0.3    ?Bilirubin, Direct 0.0 - 0.2 mg/dL   0.1    ? ? ?Lab Results  ?Component Value Date/Time  ?  TSH 0.77 03/08/2015 10:24 AM  ? ? ? ?  Latest Ref Rng & Units 11/23/2020  ? 12:58 AM 11/22/2020  ? 12:40 PM 11/17/2020  ?  9:17 AM  ?CBC  ?WBC 4.0 - 10.5 K/uL 8.8   5.5     ?Hemoglobin 13.0 - 17.0 g/dL 12.2   13.9   15.0    ?Hematocrit 39.0 - 52.0 % 37.9   44.8   44.0    ?Platelets 150 - 400 K/uL 202   191     ? ? ?No results found for: VD25OH ? ?Clinical ASCVD: Yes  ?The ASCVD Risk score (Arnett DK, et al., 2019) failed to calculate for the following reasons: ?  The valid total cholesterol range is 130 to 320 mg/dL   ? ? ?  12/01/2021  ?  3:05 PM 06/07/2021  ?  1:12 PM 06/07/2021  ?  1:10 PM  ?Depression screen PHQ 2/9  ?Decreased Interest 0 0 0  ?Down, Depressed, Hopeless 0 0 0  ?PHQ - 2 Score 0 0 0  ?Altered sleeping 0    ?Tired, decreased energy 0    ?Change in appetite 0    ?Feeling bad or failure about yourself  0    ?Trouble concentrating 0    ?Moving slowly or fidgety/restless 0    ?Suicidal thoughts 0    ?PHQ-9 Score 0    ?  ? ?CHA2DS2/VAS Stroke Risk Points  Current as of yesterday  ?   3 >= 2 Points: High Risk  ?1 - 1.99 Points: Medium Risk  ?0 Points: Low Risk  ?  Last Change: N/A   ?  ? Details   ? This score determines the patient's risk of having a stroke if the  ?patient has atrial fibrillation.  ?  ?  ? Points Metrics  ?0 Has Congestive Heart Failure:  No   ? Current as of yesterday  ?0 Has Vascular Disease:  No   ? Current as of yesterday  ?1 Has Hypertension:  Yes   ? Current as of yesterday  ?2 Age:  76   ? Current as of yesterday  ?0 Has Diabetes:  No   ? Current as of yesterday  ?0 Had Stroke:  No  Had TIA:  No  Had Thromboembolism:  No   ? Current as of yesterday  ?0 Male:  No   ? Current as of yesterday  ?  ?  ? ?Social History  ? ?Tobacco Use  ?Smoking Status Every Day  ? Packs/day: 0.50  ? Years: 50.00  ? Pack years: 25.00  ? Types: Cigarettes  ? Passive exposure: Never  ?Smokeless Tobacco Never  ? ?  BP Readings from Last 3 Encounters:  ?12/01/21 138/76  ?11/09/21 137/85  ?07/18/21 (!) 148/84   ? ?Pulse Readings from Last 3 Encounters:  ?12/01/21 63  ?11/09/21 67  ?07/18/21 64  ? ?Wt Readings from Last 3 Encounters:  ?12/01/21 223 lb (101.2 kg)  ?11/09/21 220 lb (99.8 kg)  ?07/18/21 229 lb (103.9 kg)  ? ?BMI Readings from Last 3 Encounters:  ?12/01/21 27.14 kg/m?  ?11/09/21 26.78 kg/m?  ?07/18/21 27.87 kg/m?  ? ? ?Assessment/Interventions: Review of patient past medical history, allergies, medications, health status, including review of consultants reports, laboratory and other test data, was performed as part of comprehensive evaluation and provision of chronic care management services.  ? ?SDOH:  (Social Determinants of Health) assessments and interventions performed: Yes ?SDOH Interventions   ? ?Flowsheet Row Most Recent Value  ?SDOH Interventions   ?Financial Strain Interventions Intervention Not Indicated  ?Transportation Interventions Intervention Not Indicated  ? ?  ? ?SDOH Screenings  ? ?Alcohol Screen: Low Risk   ? Last Alcohol Screening Score (AUDIT): 2  ?Depression (PHQ2-9): Low Risk   ? PHQ-2 Score: 0  ?Financial Resource Strain: Low Risk   ? Difficulty of Paying Living Expenses: Not very hard  ?Food Insecurity: No Food Insecurity  ? Worried About Charity fundraiser in the Last Year: Never true  ? Ran Out of Food in the Last Year: Never true  ?Housing: Low Risk   ? Last Housing Risk Score: 0  ?Physical Activity: Insufficiently Active  ? Days of Exercise per Week: 5 days  ? Minutes of Exercise per Session: 20 min  ?Social Connections: Moderately Isolated  ? Frequency of Communication with Friends and Family: Twice a week  ? Frequency of Social Gatherings with Friends and Family: Twice a week  ? Attends Religious Services: Never  ? Active Member of Clubs or Organizations: No  ? Attends Archivist Meetings: Never  ? Marital Status: Married  ?Stress: No Stress Concern Present  ? Feeling of Stress : Not at all  ?Tobacco Use: High Risk  ? Smoking Tobacco Use: Every Day  ? Smokeless  Tobacco Use: Never  ? Passive Exposure: Never  ?Transportation Needs: No Transportation Needs  ? Lack of Transportation (Medical): No  ? Lack of Transportation (Non-Medical): No  ? ? ?Patient reports he does

## 2021-12-13 ENCOUNTER — Ambulatory Visit (INDEPENDENT_AMBULATORY_CARE_PROVIDER_SITE_OTHER): Payer: Medicare HMO | Admitting: Pharmacist

## 2021-12-13 DIAGNOSIS — I4891 Unspecified atrial fibrillation: Secondary | ICD-10-CM

## 2021-12-13 DIAGNOSIS — I10 Essential (primary) hypertension: Secondary | ICD-10-CM

## 2021-12-13 NOTE — Progress Notes (Signed)
Patient assistance application for Xarelto/Jansen filled out to be mailed to patient with instructions and forwarded for completion to :  [name], [address] [place], [place] [address]  Phone:  [place] [employer] Pharmacist Assistant [phone]

## 2021-12-13 NOTE — Patient Instructions (Signed)
Hi Cory [name], ? ?It was great to get to meet you over the telephone! Below is a summary of some of the topics we discussed.  ? ?Don't forget to look into getting your shingles and tetanus shots at the pharmacy as soon as you can! I also attached some more helpful tips when checking your blood pressure at home to keep in mind and please try to bring your cuff to your next office visit. ? ?Please reach out to me if you have any questions or need anything before our follow up! ? ?Best, ?Maddie ? ?Jeni Salles, PharmD, BCACP ?Clinical Pharmacist ?Therapist, music at [facility] ?[phone] ? ? Visit Information ? ? Goals Addressed   ?None ?  ? ?Patient Care Plan: [place]  ?  ? ?Problem Identified: Problem: Hypertension, Hyperlipidemia, Atrial Fibrillation, GERD, Tobacco use, Allergic Rhinitis, and PAD   ?  ? ?Long-Range Goal: Patient-Specific Goal   ?Start Date: 12/13/2021  ?Expected End Date: 12/14/2022  ?This Visit's Progress: On track  ?Priority: High  ?Note:   ?Current Barriers:  ?Unable to independently monitor therapeutic efficacy ? ?Pharmacist Clinical Goal(s):  ?Patient will achieve adherence to monitoring guidelines and medication adherence to achieve therapeutic efficacy through collaboration with PharmD and provider.  ? ?Interventions: ?1:1 collaboration with [name], MD regarding development and update of comprehensive plan of care as evidenced by provider attestation and co-signature ?Inter-disciplinary care team collaboration (see longitudinal plan of care) ?Comprehensive medication review performed; medication list updated in electronic medical record ? ?Hypertension (BP goal <130/80) ?-Not ideally controlled ?-Current treatment: ?Metoprolol tartrate 25 mg 1 tablet twice daily as needed  - Appropriate, Effective, Safe, Accessible ?Diltiazem 120 mg 1 capsule daily - Appropriate, Query effective, Safe, Accessible ?-Medications previously tried: n/a  ?-Current home readings: 128/74 -  usually in the 120s - 130s range, 70s (arm cuff- checks every other day) ?-Current dietary habits: never been much of a salt person; home cooking; canned foods not often ?-Current exercise habits: trying to walk daily ?-Denies hypotensive/hypertensive symptoms ?-Educated on BP goals and benefits of medications for prevention of heart attack, stroke and kidney damage; ?Importance of home blood pressure monitoring; ?Proper BP monitoring technique; ?-Counseled to monitor BP at home a couple times a week, document, and provide log at future appointments ?-Counseled on diet and exercise extensively ?Recommended to continue current medication ? ?Hyperlipidemia: (LDL goal < 70) ?-Controlled ?-Current treatment: ?Rosuvastatin 10 mg 1 tablet daily - Appropriate, Effective, Safe, Accessible ?-Medications previously tried: none  ?-Current dietary patterns: fried chicken every once in a while;  ?-Current exercise habits: trying to walk daily; doing stretches ?-Educated on Cholesterol goals;  ?Benefits of statin for ASCVD risk reduction; ?Importance of limiting foods high in cholesterol; ?-Counseled on diet and exercise extensively ?Recommended to continue current medication ?Recommended repeat lipid panel. ? ?Atrial Fibrillation (Goal: prevent stroke and major bleeding) ?-Controlled ?-CHADSVASC: 3 ?-Current treatment: ?Rate control: Metoprolol tartrate 25 mg 1 tablet twice daily as needed; Diltiazem 120 mg 1 capsule daily - Appropriate, Effective, Safe, Accessible ?Anticoagulation: Xarelto 20 mg 1 tablet daily with supper - Appropriate, Effective, Safe, Accessible ?-Medications previously tried: n/a ?-Home BP and HR readings: 68-80; checking BP and HR a few times a week ?-Counseled on increased risk of stroke due to Afib and benefits of anticoagulation for stroke prevention; ?bleeding risk associated with Xarelto and importance of self-monitoring for signs/symptoms of bleeding; ?avoidance of NSAIDs due to increased bleeding  risk with anticoagulants; ?-Counseled on diet and exercise extensively ?  Recommended to continue current medication ? ?Allergic rhinitis (Goal: minimize allergies symptoms) ?-Controlled ?-Current treatment  ?Cetirizine 10 mg 1 tablet daily - Appropriate, Effective, Safe, Accessible ?Saline nasal spray as needed - Appropriate, Effective, Safe, Accessible ?-Medications previously tried: Flonase nasal spray (ineffective)  ?-Counseled on diet and exercise extensively ?Recommended to continue current medication ? ?Pain (Goal: minimize pain) ?-Controlled ?-Current treatment  ?Acetaminophen 500 mg 1 tablet as needed - Appropriate, Effective, Safe, Accessible ?-Medications previously tried: Tramadol (side effects)  ?-Counseled on maximum of 3000 mg of Tylenol per day. ? ? ?Health Maintenance ?-Vaccine gaps: tetanus, shingrix ?-Current therapy:  ?Vitamin D 1000 units 1 tablet daily ?-Educated on Cost vs benefit of each product must be carefully weighed by individual consumer ?-Patient is satisfied with current therapy and denies issues ?-Recommended to continue current medication ? ?Patient Goals/Self-Care Activities ?Patient will:  ?- take medications as prescribed as evidenced by patient report and record review ?check blood pressure a few times a week, document, and provide at future appointments ?target a minimum of 150 minutes of moderate intensity exercise weekly ? ?Follow Up Plan: Telephone follow up appointment with care management team member scheduled for: 6 months ? ?  ? ? ?Cory [name] was given information about Chronic Care Management services today including:  ?CCM service includes personalized support from designated clinical staff supervised by his physician, including individualized plan of care and coordination with other care providers ?24/7 contact phone numbers for assistance for urgent and routine care needs. ?Standard insurance, coinsurance, copays and deductibles apply for chronic care management only  during months in which we provide at least 20 minutes of these services. Most insurances cover these services at 100%, however patients may be responsible for any copay, coinsurance and/or deductible if applicable. This service may help you avoid the need for more expensive face-to-face services. ?Only one practitioner may furnish and bill the service in a calendar month. ?The patient may stop CCM services at any time (effective at the end of the month) by phone call to the office staff. ? ?Patient agreed to services and verbal consent obtained.  ? ?Patient verbalizes understanding of instructions and care plan provided today and agrees to view in [place]. Active MyChart status confirmed with patient.   ?Telephone follow up appointment with pharmacy team member scheduled for: 6 months ? ?Viona Gilmore, RPH  ?

## 2021-12-24 ENCOUNTER — Other Ambulatory Visit: Payer: Self-pay | Admitting: Cardiology

## 2022-01-03 DIAGNOSIS — F17210 Nicotine dependence, cigarettes, uncomplicated: Secondary | ICD-10-CM

## 2022-01-03 DIAGNOSIS — I4891 Unspecified atrial fibrillation: Secondary | ICD-10-CM | POA: Diagnosis not present

## 2022-01-03 DIAGNOSIS — E785 Hyperlipidemia, unspecified: Secondary | ICD-10-CM

## 2022-01-03 DIAGNOSIS — I10 Essential (primary) hypertension: Secondary | ICD-10-CM | POA: Diagnosis not present

## 2022-01-09 ENCOUNTER — Encounter: Payer: Self-pay | Admitting: Family Medicine

## 2022-01-09 ENCOUNTER — Ambulatory Visit (INDEPENDENT_AMBULATORY_CARE_PROVIDER_SITE_OTHER): Payer: Medicare HMO | Admitting: Family Medicine

## 2022-01-09 VITALS — BP 140/88 | HR 76 | Temp 97.8°F | Ht 76.0 in | Wt 219.7 lb

## 2022-01-09 DIAGNOSIS — E785 Hyperlipidemia, unspecified: Secondary | ICD-10-CM

## 2022-01-09 DIAGNOSIS — Z79899 Other long term (current) drug therapy: Secondary | ICD-10-CM

## 2022-01-09 DIAGNOSIS — I4891 Unspecified atrial fibrillation: Secondary | ICD-10-CM | POA: Diagnosis not present

## 2022-01-09 DIAGNOSIS — R739 Hyperglycemia, unspecified: Secondary | ICD-10-CM | POA: Diagnosis not present

## 2022-01-09 DIAGNOSIS — I10 Essential (primary) hypertension: Secondary | ICD-10-CM

## 2022-01-09 LAB — COMPREHENSIVE METABOLIC PANEL
ALT: 19 U/L (ref 0–53)
AST: 16 U/L (ref 0–37)
Albumin: 3.8 g/dL (ref 3.5–5.2)
Alkaline Phosphatase: 43 U/L (ref 39–117)
BUN: 14 mg/dL (ref 6–23)
CO2: 24 mEq/L (ref 19–32)
Calcium: 8.8 mg/dL (ref 8.4–10.5)
Chloride: 104 mEq/L (ref 96–112)
Creatinine, Ser: 0.87 mg/dL (ref 0.40–1.50)
GFR: 83 mL/min (ref >60.00)
Glucose, Bld: 92 mg/dL (ref 70–99)
Potassium: 4.1 mEq/L (ref 3.5–5.1)
Sodium: 136 mEq/L (ref 135–145)
Total Bilirubin: 0.7 mg/dL (ref 0.2–1.2)
Total Protein: 6.7 g/dL (ref 6.0–8.3)

## 2022-01-09 LAB — LIPID PANEL
Cholesterol: 115 mg/dL (ref 0–200)
HDL: 58 mg/dL (ref >39.00)
LDL Cholesterol: 44 mg/dL (ref 0–99)
NonHDL: 56.55
Total CHOL/HDL Ratio: 2
Triglycerides: 65 mg/dL (ref 0.0–149.0)
VLDL: 13 mg/dL (ref 0.0–40.0)

## 2022-01-09 LAB — CBC WITH DIFFERENTIAL/PLATELET
Basophils Absolute: 0 10*3/uL (ref 0.0–0.1)
Basophils Relative: 0.7 % (ref 0.0–3.0)
Eosinophils Absolute: 0.2 10*3/uL (ref 0.0–0.7)
Eosinophils Relative: 3.2 % (ref 0.0–5.0)
HCT: 47.3 % (ref 39.0–52.0)
Hemoglobin: 15.8 g/dL (ref 13.0–17.0)
Lymphocytes Relative: 33.4 % (ref 12.0–46.0)
Lymphs Abs: 1.8 10*3/uL (ref 0.7–4.0)
MCHC: 33.3 g/dL (ref 30.0–36.0)
MCV: 97.3 fl (ref 78.0–100.0)
Monocytes Absolute: 0.6 10*3/uL (ref 0.1–1.0)
Monocytes Relative: 10.2 % (ref 3.0–12.0)
Neutro Abs: 2.9 10*3/uL (ref 1.4–7.7)
Neutrophils Relative %: 52.5 % (ref 43.0–77.0)
Platelets: 166 10*3/uL (ref 150.0–400.0)
RBC: 4.86 Mil/uL (ref 4.22–5.81)
RDW: 13.2 % (ref 11.5–15.5)
WBC: 5.5 10*3/uL (ref 4.0–10.5)

## 2022-01-09 LAB — HEMOGLOBIN A1C: Hgb A1c MFr Bld: 5.6 % (ref 4.6–6.5)

## 2022-01-09 NOTE — Progress Notes (Signed)
Established Patient Office Visit  Subjective   Patient ID: Cory [name], male    DOB: 1946/02/01  Age: 76 y.o. MRN: [id number]  Chief Complaint  Patient presents with   Follow-up    HPI   Seen for medical follow-up.  He has history of peripheral artery disease, hypertension, abdominal aortic aneurysm, dyslipidemia, ongoing nicotine use, aortic stenosis by previous echocardiogram, and atrial fibrillation.  Followed closely by cardiology and vascular surgery.  Denies any recent claudication symptoms.  He had recent ABIs which were stable.  He has scheduled follow-up echocardiogram in October.  He has some chronic dyspnea at baseline but unchanged.  No recent chest pains.  Still smoking but has scaled back to about 5 to 6 cigarettes/day.  Was seen here with chronic sore throat symptoms last fall.  Has had extensive work-up by ENT with no etiology obvious.  He had CT of the neck as well as nasolaryngoscopy.  He thinks some of this may be coming from mouth breathing and dryness at night.  Remains on Xarelto with prior history of A-fib.  Is on rosuvastatin 10 mg daily and diltiazem extended release 120 mg daily.  Home blood pressures consistently around 130/70.  He brought in his monitor today to compare with ours but unfortunately his battery went out during visit.  Past Medical History:  Diagnosis Date   Aortic stenosis    a. Moderate by echo 05/2020.   Arthritis    right hip   Cancer ([place])    skin cancer nose, right left, upper left basel cell   Dysrhythmia    a-fib   PAF (paroxysmal atrial fibrillation) (HCC)    Peripheral arterial disease (HCC)    Tobacco abuse    Past Surgical History:  Procedure Laterality Date   ABDOMINAL AORTIC ENDOVASCULAR STENT GRAFT N/A 08/29/2020   Procedure: ABDOMINAL AORTIC ENDOVASCULAR STENT GRAFT;  Surgeon: Elam Dutch, MD;  Location: [facility];  Service: Vascular;  Laterality: N/A;   ABDOMINAL AORTOGRAM W/LOWER EXTREMITY Bilateral 06/24/2020    Procedure: ABDOMINAL AORTOGRAM W/LOWER EXTREMITY;  Surgeon: Elam Dutch, MD;  Location: [facility];  Service: Cardiovascular;  Laterality: Bilateral;   BACK SURGERY     2005   CERVICAL SPINE SURGERY     EMBOLECTOMY Right 08/29/2020   Procedure: POPLITEAL AND TIBIAL EMBOLECTOMY;  Surgeon: Elam Dutch, MD;  Location: [facility];  Service: Vascular;  Laterality: Right;   EMBOLIZATION Right 06/24/2020   Procedure: EMBOLIZATION;  Surgeon: Elam Dutch, MD;  Location: [facility];  Service: Cardiovascular;  Laterality: Right;   ENDARTERECTOMY FEMORAL Right 08/29/2020   Procedure: ENDARTERECTOMY FEMORAL;  Surgeon: Elam Dutch, MD;  Location: [facility];  Service: Vascular;  Laterality: Right;   EYE SURGERY     FEMORAL ARTERY EXPLORATION Right 06/25/2020   Procedure: RIGHT GROIN EXPLORATION Evacuation of Hematoma  WITH REPAIR OF RIGHT Common FEMORAL ARTERY.;  Surgeon: Angelia Mould, MD;  Location: [facility];  Service: Vascular;  Laterality: Right;   FEMORAL-POPLITEAL BYPASS GRAFT Left 11/22/2020   Procedure: LEFT ABOVE KNEE-BELOW KNEE BYPASS TIBIAL PERITONEAL TRUNK REVERSE IPSILATERAL GREATER SAPHENOUS VEIN;  Surgeon: Elam Dutch, MD;  Location: [facility];  Service: Vascular;  Laterality: Left;   LEG SURGERY     Trauma   LOWER EXTREMITY ANGIOGRAPHY Left 11/17/2020   Procedure: Lower Extremity Angiography;  Surgeon: Marty Heck, MD;  Location: [facility];  Service: Cardiovascular;  Laterality: Left;   PATCH ANGIOPLASTY Right  08/29/2020   Procedure: PATCH ANGIOPLASTY RIGHT BELOW KNEE POPLITEAL ARTERY AND RIGHT COMMON FEMORAL ARTERY;  Surgeon: Elam Dutch, MD;  Location: [facility];  Service: Vascular;  Laterality: Right;   RETINAL DETACHMENT SURGERY     ULTRASOUND GUIDANCE FOR VASCULAR ACCESS Bilateral 08/29/2020   Procedure: ULTRASOUND GUIDANCE FOR VASCULAR ACCESS;  Surgeon: Elam Dutch, MD;  Location: [facility];  Service: Vascular;  Laterality:  Bilateral;    reports that he has been smoking cigarettes. He has a 25.00 pack-year smoking history. He has never been exposed to tobacco smoke. He has never used smokeless tobacco. He reports current alcohol use. He reports that he does not use drugs. family history includes Alcoholism in his father; Breast cancer in his sister; Ovarian cancer in his mother and sister. Allergies  Allergen Reactions   Azithromycin Diarrhea   2,4-D Dimethylamine Other (See Comments) and Itching    Review of Systems  Constitutional:  Negative for malaise/fatigue and weight loss.  Respiratory:  Positive for shortness of breath. Negative for cough and wheezing.   Cardiovascular:  Negative for chest pain, palpitations, claudication and leg swelling.  Genitourinary:  Negative for dysuria.     Objective:     BP 140/88 (BP Location: Left Arm, Patient Position: Sitting, Cuff Size: Normal)   Pulse 76   Temp 97.8 F (36.6 C) (Oral)   Ht '6\' 4"'$  (1.93 m)   Wt 219 lb 11.2 oz (99.7 kg)   SpO2 95%   BMI 26.74 kg/m    Physical Exam Constitutional:      Appearance: He is well-developed.  Eyes:     Pupils: Pupils are equal, round, and reactive to light.  Neck:     Thyroid: No thyromegaly.  Cardiovascular:     Rate and Rhythm: Normal rate and regular rhythm.     Heart sounds: Murmur heard.     Comments: He has 2/6 to 3/6 systolic murmur over right upper sternal border.  2/6 systolic murmur left axillary region Pulmonary:     Effort: Pulmonary effort is normal. No respiratory distress.     Breath sounds: Normal breath sounds. No wheezing or rales.  Musculoskeletal:     Cervical back: Neck supple.     Right lower leg: No edema.     Left lower leg: No edema.  Neurological:     Mental Status: He is alert and oriented to person, place, and time.     No results found for any visits on 01/09/22.    The ASCVD Risk score (Arnett DK, et al., 2019) failed to calculate for the following reasons:   The valid  total cholesterol range is 130 to 320 mg/dL    Assessment & Plan:   Problem List Items Addressed This Visit       Unprioritized   Essential hypertension   Relevant Orders   CMP   Dyslipidemia   Relevant Orders   Lipid panel   CMP   Atrial fibrillation, transient ([place]) - Primary   Other Visit Diagnoses     Hyperglycemia       Relevant Orders   Hemoglobin A1c   High risk medication use       Relevant Orders   CBC with Differential/Platelet     -Blood pressure slightly up today but home readings have been fairly well controlled.  Continue close monitoring and bring his cuff back to compare with ours at next visit  -Check labs as above.  We noted that he had several blood  sugars that have been above 126 but not clear if any of these were fasting.  Check A1c.  -Continue annual follow-up for aneurysm screening  -Continue close follow-up with cardiology regarding aortic stenosis and A-fib  -Strongly encouraged to stop smoking completely.  -Recommend annual flu vaccine  No follow-ups on file.    Carolann Littler, MD

## 2022-01-09 NOTE — Progress Notes (Signed)
a 

## 2022-01-25 ENCOUNTER — Other Ambulatory Visit: Payer: Self-pay | Admitting: Cardiology

## 2022-01-25 DIAGNOSIS — I4891 Unspecified atrial fibrillation: Secondary | ICD-10-CM

## 2022-01-25 NOTE — Telephone Encounter (Signed)
Xarelto '20mg'$  refill request received. Pt is 76 years old, weight-99.7kg, Crea-0.87 on 01/09/2022, last seen by Dr. Percival Spanish on 07/18/2021, Diagnosis-Afib, CrCl-100.20m/min; Dose is appropriate based on dosing criteria. Will send in refill to requested pharmacy.

## 2022-02-19 ENCOUNTER — Other Ambulatory Visit: Payer: Self-pay | Admitting: Otolaryngology

## 2022-02-21 ENCOUNTER — Ambulatory Visit (HOSPITAL_COMMUNITY): Payer: Medicare HMO | Admitting: Anesthesiology

## 2022-02-21 NOTE — Progress Notes (Signed)
Patient's chart reviewed and due to hx of mod aortic stenosis, patient does not meet [facility] Guidelines for outpatient surgery. He will need repeat ECHO before being schedules for surgery. Angie at Dr Redmond Baseman office made aware.

## 2022-03-09 ENCOUNTER — Telehealth: Payer: Self-pay | Admitting: Pharmacist

## 2022-03-09 ENCOUNTER — Other Ambulatory Visit: Payer: Self-pay | Admitting: Cardiology

## 2022-03-09 NOTE — Chronic Care Management (AMB) (Signed)
    Chronic Care Management Pharmacy Assistant   Name: Cory [name]  MRN: [id number] DOB: 1945-09-06  Reason for Encounter: Disease State   Conditions to be addressed/monitored: HTN  Recent office visits:  01/09/22 [name], MD - Patient presented for Atrial fibrillation and other concerns. No medication changes.  Recent consult visits:  None   Hospital visits:  None in previous 6 months  Medications: Outpatient Encounter Medications as of 03/09/2022  Medication Sig   acetaminophen (TYLENOL) 500 MG tablet Take 500 mg by mouth every 6 (six) hours as needed (for pain.).   cetirizine (ZYRTEC) 10 MG tablet Take 1 tablet (10 mg total) by mouth daily. (Patient taking differently: Take 10 mg by mouth daily as needed for allergies.)   cholecalciferol (VITAMIN D) 25 MCG (1000 UNIT) tablet Take 1,000 Units by mouth 2 (two) times daily.   diltiazem (TIADYLT ER) 120 MG 24 hr capsule TAKE 1 CAPSULE EVERY DAY (SCHEDULE OFFICE VISIT FOR FUTURE REFILLS)   metoprolol tartrate (LOPRESSOR) 25 MG tablet Take 1 tablet by mouth twice daily as needed (Patient taking differently: Take 25 mg by mouth 2 (two) times daily as needed (afib).)   rivaroxaban (XARELTO) 20 MG TABS tablet TAKE 1 TABLET BY MOUTH ONCE DAILY WITH SUPPER   rosuvastatin (CRESTOR) 10 MG tablet Take 1 tablet (10 mg total) by mouth daily.   sodium chloride (OCEAN) 0.65 % SOLN nasal spray Place 1 spray into both nostrils as needed for congestion.   No facility-administered encounter medications on file as of 03/09/2022.   Reviewed chart prior to disease state call. Spoke with patient regarding BP  Recent Office Vitals: BP Readings from Last 3 Encounters:  01/09/22 140/88  12/01/21 138/76  11/09/21 137/85   Pulse Readings from Last 3 Encounters:  01/09/22 76  12/01/21 63  11/09/21 67    Wt Readings from Last 3 Encounters:  01/09/22 219 lb 11.2 oz (99.7 kg)  12/01/21 223 lb (101.2 kg)  11/09/21 220 lb (99.8 kg)      Kidney Function Lab Results  Component Value Date/Time   CREATININE 0.87 01/09/2022 09:34 AM   CREATININE 0.90 05/29/2021 12:21 PM   GFR 83.00 01/09/2022 09:34 AM   GFRNONAA >60 11/23/2020 12:58 AM   GFRAA 94 05/16/2018 02:25 PM       Latest Ref Rng & Units 01/09/2022    9:34 AM 05/29/2021   12:21 PM 11/23/2020   12:58 AM  BMP  Glucose 70 - 99 mg/dL 92   155   BUN 6 - 23 mg/dL 14   10   Creatinine 0.40 - 1.50 mg/dL 0.87  0.90  0.91   Sodium 135 - 145 mEq/L 136   134   Potassium 3.5 - 5.1 mEq/L 4.1   4.2   Chloride 96 - 112 mEq/L 104   104   CO2 19 - 32 mEq/L 24   26   Calcium 8.4 - 10.5 mg/dL 8.8   8.1     Current antihypertensive regimen:  Metoprolol tartrate 25 mg 1 tablet twice daily as needed  - Appropriate, Effective, Safe, Accessible Diltiazem 120 mg 1 capsule daily - Appropriate, Query effective, Safe, Accessible Unable to reach  Care Gaps: Hepatitis C Screening - Overdue Zoster Vaccine - Overdue TDAP - Overdue Flu Vaccine - Overdue CCM- 11/23 BP- 140/88 01/09/22 AWV- 11/22  Star Rating Drugs: Rosuvastatin 10 mg - Last filled 01/24/22 90 DS at [facility] Pharmacist Assistant [phone]

## 2022-04-03 ENCOUNTER — Telehealth: Payer: Self-pay | Admitting: Cardiology

## 2022-04-03 ENCOUNTER — Other Ambulatory Visit: Payer: Self-pay

## 2022-04-03 NOTE — Anesthesia Preprocedure Evaluation (Signed)
Anesthesia Evaluation  Patient identified by MRN, date of birth, ID band Patient awake    Reviewed: Allergy & Precautions, NPO status , Patient's Chart, lab work & pertinent test results  Airway Mallampati: II  TM Distance: >3 FB Neck ROM: Full    Dental  (+) Dental Advisory Given, Teeth Intact   Pulmonary Current Smoker and Patient abstained from smoking.,    Pulmonary exam normal breath sounds clear to auscultation       Cardiovascular hypertension, Pt. on medications and Pt. on home beta blockers + Peripheral Vascular Disease  + dysrhythmias  Rhythm:Regular Rate:Normal + Systolic murmurs Echo 55/7322 1. Technically difficult echo with poor image quality.  2. Left ventricular ejection fraction, by estimation, is 60 to 65%. The left ventricle has normal function. The left ventricle has no regional wall motion abnormalities. Left ventricular diastolic parameters are consistent with Grade I diastolic dysfunction (impaired relaxation).  3. Right ventricular systolic function is normal. The right ventricular size is normal. There is normal pulmonary artery systolic pressure.  4. Left atrial size was mildly dilated.  5. The mitral valve is grossly normal. No evidence of mitral valve regurgitation.  6. The aortic valve is calcified. Aortic valve regurgitation is not visualized. Moderate aortic valve stenosis.  7. Aortic dilatation noted. There is moderate dilatation of the ascending aorta, measuring 44 mm.    Neuro/Psych  Neuromuscular disease negative neurological ROS     GI/Hepatic negative GI ROS, Neg liver ROS,   Endo/Other    Renal/GU negative Renal ROS     Musculoskeletal  (+) Arthritis ,   Abdominal   Peds  Hematology   Anesthesia Other Findings   Reproductive/Obstetrics                           Anesthesia Physical  Anesthesia Plan  ASA: 4  Anesthesia Plan: General   Post-op  Pain Management: Tylenol PO (pre-op)*   Induction: Intravenous  PONV Risk Score and Plan: 2 and Ondansetron, Dexamethasone and Midazolam  Airway Management Planned: Oral ETT  Additional Equipment: ClearSight  Intra-op Plan:   Post-operative Plan: Possible Post-op intubation/ventilation  Informed Consent: I have reviewed the patients History and Physical, chart, labs and discussed the procedure including the risks, benefits and alternatives for the proposed anesthesia with the patient or authorized representative who has indicated his/her understanding and acceptance.     Dental advisory given  Plan Discussed with: CRNA  Anesthesia Plan Comments: (Cardiology consulted for risk assessment. Unable to reach patient. Note says case on hold. Attempted to discuss with Dr. Redmond Baseman.    PAT note by Karoline Caldwell, PA-C: Follows with cardiology for history of paroxysmal A. fib and moderate aortic stenosis.  He is maintained on Xarelto.  He had a negative stress test 02/15/2015.  Most recent echo October 2021 showed EF 60 to 65%, grade 1 DD, moderate AS (mean gradient 22.3 mmHg), moderate dilation of the ascending aorta (44 mm).  Previously cleared by cardiology in November 2021 to undergo vascular surgery. Per telephone encounter 06/10/2020, "Chart reviewed as part of pre-operative protocol coverage. Patient was last seen by Dr. Percival Spanish on 05/13/2020 at which time he was doing well. Patient was contacted on 06/09/2020 for further pre-op evaluation. He was doing well at that time from a cardiac standpoint. He denied any exertional chest pain or shortness of breath and was able to complete >4.0 METS. Dr. Percival Spanish did not feel like any additional ischemic evaluation was needed  piror to procedure. Spoke with [name] in the cath lab who stated Xarelto is usually held for 48 hours prior to procedure. Per Dr. Percival Spanish, [place] to hold Xarelto. This should be restarted as soon as able following procedure."  Patient  underwent abdominal aortic stent graft with right common femoral endarterectomy with bovine patch angioplasty 08/29/2020.  Patient will need day of surgery labs and evaluation.  EKG 05/13/2020: NSR.  Rate 65.  LAFB.  CTA abdomen pelvis 09/15/2020: IMPRESSION: VASCULAR  Status post AAA repair and right iliac aneurysm repair with a bifurcated endograft. Aneurysms appear excluded. Negative for endoleak.  No other acute intra-abdominal or pelvic vascular finding.  NON-VASCULAR  Postoperative right inguinal superficial 5.2 cm fluid collection over the femoral vessels may represent seroma, lymphocele, less likely hematoma or abscess.  No other acute intra-abdominopelvic finding  PORTABLE CHEST 1 VIEW 08/29/2020: COMPARISON:  January 11, 2015  FINDINGS: The lungs are clear. Heart is mildly enlarged with pulmonary vascularity normal. No adenopathy. There is aortic atherosclerosis. There is postoperative change in the lower cervical region.  IMPRESSION: Lungs clear.  Heart slightly enlarged.  Aortic Atherosclerosis (ICD10-I70.0).  TTE 05/31/2020: 1. Technically difficult echo with poor image quality.  2. Left ventricular ejection fraction, by estimation, is 60 to 65%. The  left ventricle has normal function. The left ventricle has no regional  wall motion abnormalities. Left ventricular diastolic parameters are  consistent with Grade I diastolic  dysfunction (impaired relaxation).  3. Right ventricular systolic function is normal. The right ventricular  size is normal. There is normal pulmonary artery systolic pressure.  4. Left atrial size was mildly dilated.  5. The mitral valve is grossly normal. No evidence of mitral valve  regurgitation.  6. The aortic valve is calcified. Aortic valve regurgitation is not  visualized. Moderate aortic valve stenosis.  7. Aortic dilatation noted. There is moderate dilatation of the ascending  aorta, measuring 44 mm.  )       Anesthesia Quick Evaluation

## 2022-04-03 NOTE — Progress Notes (Signed)
Cory [name] denies chest pain or shortness of breath.  Patient denies having any s/s of Covid in his household, also denies any known exposure to Covid.   Cory [name] has PAF, patient stated that he has not had to take Metoprolol > 1 month.  Cory [name] PCP is Dr. Carolann Littler; Cardiologist is Dr. Percival Spanish.

## 2022-04-03 NOTE — Telephone Encounter (Signed)
Notes faxed to surgeon. Will leave in preop pool in case procedure is placed on hold until clearance given. Richardson Dopp, PA-C  04/03/2022 5:16 PM

## 2022-04-03 NOTE — Telephone Encounter (Signed)
   Pre-operative Risk Assessment    Patient Name: Cory [name]  DOB: 1946-04-14 MRN: [id number]      Request for Surgical Clearance    Procedure:   Direct Laryngoscopy with biopsy  Date of Surgery:  04-04-22                                 Surgeon:   Dr Melida Quitter Surgeon's Group or Practice Name:   Phone number:  [phone] Fax number:  [phone]   Type of Clearance Requested:  Medical    Type of Anesthesia:  General    Additional requests/questions:    Signed, Glyn Ade   04/03/2022, 1:44 PM

## 2022-04-03 NOTE — Telephone Encounter (Signed)
   Name: Cory [name] DOB: 1946/03/23  MRN: [id number]  Primary Cardiologist: [name], MD  Chart reviewed as part of pre-operative protocol coverage.   Request for surgical clearance was just received today. We have tried to reach this patient this evening to assess for surgical risk. We could not reach the patient. Procedure is scheduled for tomorrow.   Recommendations: Perioperative CV risk cannot be assessed at this time. According to ACC/AHA guidelines, if surgery is emergent, proceed at elevated risk. If procedure is elective, would recommend postponing procedure so that risk assessment can be made.    Please call with questions. Richardson Dopp, PA-C 04/03/2022, 5:14 PM

## 2022-04-04 ENCOUNTER — Encounter (HOSPITAL_COMMUNITY): Payer: Self-pay | Admitting: Otolaryngology

## 2022-04-04 ENCOUNTER — Ambulatory Visit (HOSPITAL_COMMUNITY)
Admission: RE | Admit: 2022-04-04 | Discharge: 2022-04-04 | Disposition: A | Discharge status: Home / Self Care | Payer: Medicare HMO | Attending: Otolaryngology | Admitting: Otolaryngology

## 2022-04-04 ENCOUNTER — Encounter (HOSPITAL_COMMUNITY)
Admission: RE | Disposition: A | Discharge status: Home / Self Care | Payer: Self-pay | Source: Home / Self Care | Attending: Otolaryngology

## 2022-04-04 DIAGNOSIS — Z539 Procedure and treatment not carried out, unspecified reason: Secondary | ICD-10-CM | POA: Diagnosis not present

## 2022-04-04 DIAGNOSIS — Z20822 Contact with and (suspected) exposure to covid-19: Secondary | ICD-10-CM | POA: Insufficient documentation

## 2022-04-04 LAB — BASIC METABOLIC PANEL
Anion gap: 11 (ref 5–15)
BUN: 13 mg/dL (ref 8–23)
CO2: 23 mmol/L (ref 22–32)
Calcium: 8.7 mg/dL — ABNORMAL LOW (ref 8.9–10.3)
Chloride: 103 mmol/L (ref 98–111)
Creatinine, Ser: 0.94 mg/dL (ref 0.61–1.24)
GFR, Estimated: >60 mL/min (ref >60)
Glucose, Bld: 96 mg/dL (ref 70–99)
Potassium: 4.1 mmol/L (ref 3.5–5.1)
Sodium: 137 mmol/L (ref 135–145)

## 2022-04-04 LAB — SARS CORONAVIRUS 2 BY RT PCR: SARS Coronavirus 2 by RT PCR: NEGATIVE

## 2022-04-04 LAB — CBC
HCT: 45.4 % (ref 39.0–52.0)
Hemoglobin: 15 g/dL (ref 13.0–17.0)
MCH: 32.9 pg (ref 26.0–34.0)
MCHC: 33 g/dL (ref 30.0–36.0)
MCV: 99.6 fL (ref 80.0–100.0)
Platelets: 176 10*3/uL (ref 150–400)
RBC: 4.56 MIL/uL (ref 4.22–5.81)
RDW: 12.9 % (ref 11.5–15.5)
WBC: 6 10*3/uL (ref 4.0–10.5)
nRBC: 0 % (ref 0.0–0.2)

## 2022-04-04 SURGERY — LARYNGOSCOPY, DIRECT
Anesthesia: General | Laterality: Bilateral

## 2022-04-04 MED ORDER — CHLORHEXIDINE GLUCONATE 0.12 % MT SOLN
15.0000 mL | OROMUCOSAL | Status: AC
  Administered 2022-04-04: 15 mL via OROMUCOSAL
  Filled 2022-04-04 (×2): qty 15

## 2022-04-04 MED ORDER — CEFAZOLIN SODIUM-DEXTROSE 2-4 GM/100ML-% IV SOLN
2.0000 g | INTRAVENOUS | Status: DC
  Filled 2022-04-04: qty 100

## 2022-04-04 MED ORDER — LACTATED RINGERS IV SOLN: INTRAVENOUS | Status: DC

## 2022-04-04 NOTE — Telephone Encounter (Signed)
Pt called back to reschedule his preop appt for 04/12/22 with Quentin Ore, [place].

## 2022-04-04 NOTE — Progress Notes (Signed)
Cardiology did not clear patient for surgery prior to DOS. Cardiology called this am and stated patient needed to be seen in office for testing and evaluation. Appointment made for Friday, April 06, 2022 at 0850. Patient and wife made aware. Patient dc'd to home with wife.

## 2022-04-04 NOTE — Telephone Encounter (Signed)
Pt has appt with Arnold Long, NP 04/06/22. Clearance can be provided at that visit. Please fwd to provider. Note will be removed from preop pool. Richardson Dopp, PA-C    04/04/2022 10:38 AM

## 2022-04-04 NOTE — Telephone Encounter (Signed)
Sent to requesting surgeon's office.

## 2022-04-06 ENCOUNTER — Ambulatory Visit: Payer: Medicare HMO | Admitting: Adult Health

## 2022-04-10 NOTE — Progress Notes (Signed)
Cardiology Office Note:    Date:  04/12/2022   ID:  Cory [name], DOB [birth year], MRN [id number]  PCP:  [name], MD [place] Cardiologist: [name], MD   Reason for visit: Preop eval Procedure:   Direct Laryngoscopy with biopsy Date of Surgery:  04-04-22  - rescheduled                        Surgeon:   Dr Melida Quitter Phone number:  [phone] Fax number:  [phone]  History of Present Illness:    Cory [name] is a 76 y.o. male with a hx of paroxysmal atrial fibrillation, tobacco use, aortic stenosis and PAD with abdominal aortic endovascular stent graft with Dr. Oneida Alar in January 2022 as well as extensive work-up to the lower extremities with Dr. Oneida Alar.  Last saw Dr. Percival Spanish in December 2022.  Patient was smoking 3 to 4 cigarettes a day.  Today, he states he feels well.  With his atrial fibrillation, he seldomly has palpitations - maybe every 3 months lasting 1 minute.  He takes metoprolol tartrate 25 mg as needed and maybe has taken 2 tablets over the past year.  He is taking Xarelto without bleeding issues.  He does mention that he is about to enter the donut hole and asked for any samples.  He continues to smoke half pack per day.  He denies chest pain, shortness of breath, PND, orthopnea, lightheadedness, syncope and claudication.  He has chronic very mild ankle edema.      Past Medical History:  Diagnosis Date   Aortic stenosis    a. Moderate by echo 05/2020.   Arthritis    right hip   Cancer ([facility])    skin cancer nose, right left, upper left basel cell   Dysrhythmia    a-fib   PAF (paroxysmal atrial fibrillation) (HCC)    Peripheral arterial disease (HCC)    Tobacco abuse     Past Surgical History:  Procedure Laterality Date   ABDOMINAL AORTIC ENDOVASCULAR STENT GRAFT N/A 08/29/2020   Procedure: ABDOMINAL AORTIC ENDOVASCULAR STENT GRAFT;  Surgeon: Elam Dutch, MD;  Location: [facility];  Service: Vascular;  Laterality: N/A;    ABDOMINAL AORTOGRAM W/LOWER EXTREMITY Bilateral 06/24/2020   Procedure: ABDOMINAL AORTOGRAM W/LOWER EXTREMITY;  Surgeon: Elam Dutch, MD;  Location: [facility];  Service: Cardiovascular;  Laterality: Bilateral;   BACK SURGERY     2005   CERVICAL SPINE SURGERY     EMBOLECTOMY Right 08/29/2020   Procedure: POPLITEAL AND TIBIAL EMBOLECTOMY;  Surgeon: Elam Dutch, MD;  Location: [facility];  Service: Vascular;  Laterality: Right;   EMBOLIZATION Right 06/24/2020   Procedure: EMBOLIZATION;  Surgeon: Elam Dutch, MD;  Location: [facility];  Service: Cardiovascular;  Laterality: Right;   ENDARTERECTOMY FEMORAL Right 08/29/2020   Procedure: ENDARTERECTOMY FEMORAL;  Surgeon: Elam Dutch, MD;  Location: [facility];  Service: Vascular;  Laterality: Right;   EYE SURGERY     FEMORAL ARTERY EXPLORATION Right 06/25/2020   Procedure: RIGHT GROIN EXPLORATION Evacuation of Hematoma  WITH REPAIR OF RIGHT Common FEMORAL ARTERY.;  Surgeon: Angelia Mould, MD;  Location: [facility];  Service: Vascular;  Laterality: Right;   FEMORAL-POPLITEAL BYPASS GRAFT Left 11/22/2020   Procedure: LEFT ABOVE KNEE-BELOW KNEE BYPASS TIBIAL PERITONEAL TRUNK REVERSE IPSILATERAL GREATER SAPHENOUS VEIN;  Surgeon: Elam Dutch, MD;  Location: [facility];  Service: Vascular;  Laterality: Left;  LEG SURGERY     Trauma   LOWER EXTREMITY ANGIOGRAPHY Left 11/17/2020   Procedure: Lower Extremity Angiography;  Surgeon: Marty Heck, MD;  Location: [facility];  Service: Cardiovascular;  Laterality: Left;   PATCH ANGIOPLASTY Right 08/29/2020   Procedure: PATCH ANGIOPLASTY RIGHT BELOW KNEE POPLITEAL ARTERY AND RIGHT COMMON FEMORAL ARTERY;  Surgeon: Elam Dutch, MD;  Location[facility];  Service: Vascular;  Laterality: Right;   RETINAL DETACHMENT SURGERY     ULTRASOUND GUIDANCE FOR VASCULAR ACCESS Bilateral 08/29/2020   Procedure: ULTRASOUND GUIDANCE FOR VASCULAR ACCESS;  Surgeon: Elam Dutch,  MD;  Location: [facility];  Service: Vascular;  Laterality: Bilateral;    Current Medications: Current Meds  Medication Sig   acetaminophen (TYLENOL) 500 MG tablet Take 500 mg by mouth every 6 (six) hours as needed (for pain.).   cetirizine (ZYRTEC) 10 MG tablet Take 1 tablet (10 mg total) by mouth daily. (Patient taking differently: Take 10 mg by mouth daily as needed for allergies.)   cholecalciferol (VITAMIN D) 25 MCG (1000 UNIT) tablet Take 1,000 Units by mouth 2 (two) times daily.   diltiazem (TIADYLT ER) 120 MG 24 hr capsule TAKE 1 CAPSULE EVERY DAY (SCHEDULE OFFICE VISIT FOR FUTURE REFILLS)   rivaroxaban (XARELTO) 20 MG TABS tablet TAKE 1 TABLET BY MOUTH ONCE DAILY WITH SUPPER   rosuvastatin (CRESTOR) 10 MG tablet Take 1 tablet (10 mg total) by mouth daily. (Patient taking differently: Take 10 mg by mouth every evening.)   [DISCONTINUED] metoprolol tartrate (LOPRESSOR) 25 MG tablet Take 1 tablet by mouth twice daily as needed (Patient taking differently: Take 25 mg by mouth 2 (two) times daily as needed (afib).)     Allergies:   Zithromax [azithromycin] and 2,4-d dimethylamine   Social History   Socioeconomic History   Marital status: Married    Spouse name: Not on file   Number of children: 5   Years of education: Not on file   Highest education level: Master's degree (e.g., MA, MS, MEng, MEd, MSW, MBA)  Occupational History   Not on file  Tobacco Use   Smoking status: Every Day    Packs/day: 0.50    Years: 50.00    Total pack years: 25.00    Types: Cigarettes    Passive exposure: Never   Smokeless tobacco: Never  Vaping Use   Vaping Use: Never used  Substance and Sexual Activity   Alcohol use: Yes    Alcohol/week: 0.0 standard drinks of alcohol    Comment: 4 to 5 times a week; beer, wine and liquor    Drug use: No   Sexual activity: Not on file  Other Topics Concern   Not on file  Social History Narrative   Scores reading tests.  Lives with wife.     Social  Determinants of Health   Financial Resource Strain: Low Risk  (01/08/2022)   Overall Financial Resource Strain (CARDIA)    Difficulty of Paying Living Expenses: Not hard at all  Food Insecurity: No Food Insecurity (01/08/2022)   Hunger Vital Sign    Worried About Running Out of Food in the Last Year: Never true    Ran Out of Food in the Last Year: Never true  Transportation Needs: No Transportation Needs (01/08/2022)   PRAPARE - Hydrologist (Medical): No    Lack of Transportation (Non-Medical): No  Physical Activity: Insufficiently Active (01/08/2022)   Exercise Vital Sign    Days of  Exercise per Week: 3 days    Minutes of Exercise per Session: 30 min  Stress: No Stress Concern Present (01/08/2022)   [place]    Feeling of Stress : Not at all  Social Connections: Moderately Isolated (01/08/2022)   Social Connection and Isolation Panel [NHANES]    Frequency of Communication with Friends and Family: More than three times a week    Frequency of Social Gatherings with Friends and Family: Three times a week    Attends Religious Services: Never    Active Member of Clubs or Organizations: No    Attends Archivist Meetings: Never    Marital Status: Married     Family History: The patient's family history includes Alcoholism in his father; Breast cancer in his sister; Ovarian cancer in his mother and sister.  ROS:   Please see the history of present illness.     EKGs/Labs/Other Studies Reviewed:    EKG:  The ekg ordered today demonstrates normal sinus rhythm, heart rate 71, left axis deviation.  Recent Labs: 01/09/2022: ALT 19 04/04/2022: BUN 13; Creatinine, Ser 0.94; Hemoglobin 15.0; Platelets 176; Potassium 4.1; Sodium 137   Recent Lipid Panel Lab Results  Component Value Date/Time   CHOL 115 01/09/2022 09:34 AM   TRIG 65.0 01/09/2022 09:34 AM   HDL 58.00 01/09/2022 09:34 AM    LDLCALC 44 01/09/2022 09:34 AM    Physical Exam:    VS:  BP (!) 142/80 (BP Location: Left Arm, Patient Position: Sitting, Cuff Size: Normal)   Pulse 71   Ht '6\' 4"'$  (1.93 m)   Wt 219 lb (99.3 kg)   BMI 26.66 kg/m    No data found.   Wt Readings from Last 3 Encounters:  04/12/22 219 lb (99.3 kg)  04/04/22 225 lb (102.1 kg)  01/09/22 219 lb 11.2 oz (99.7 kg)    GEN:  Well nourished, well developed in no acute distress HEENT: Normal NECK: No JVD; No carotid bruits CARDIAC: RRR, systolic murmur heard best at right upper sternal border RESPIRATORY:  Clear to auscultation without rales, wheezing or rhonchi  ABDOMEN: Soft, non-tender, non-distended MUSCULOSKELETAL: Trace ankle edema, superficial varicosities SKIN: Warm and dry NEUROLOGIC:  Alert and oriented PSYCHIATRIC:  Normal affect    ASSESSMENT AND PLAN   Preop [id number]} Mr. Rooke perioperative risk of a major cardiac event is 0.4% according to the Revised Cardiac Risk Index (RCRI).  Therefore, he is at low risk for perioperative complications.   His functional capacity is excellent at 7.59 METs according to the Duke Activity Status Index (DASI). Recommendations: According to ACC/AHA guidelines, no further cardiovascular testing needed.  The patient may proceed to surgery at acceptable risk.   Antiplatelet and/or Anticoagulation Recommendations: Xarelto (Rivaroxaban) can be held for 2 days prior to surgery.  Please resume post op when felt to be safe.  Can restart Xarelto 1 day postop at surgeon's discretion.   Paroxysmal atrial fibrillation -Rare palpitations -Continue diltiazem 120 mg daily -Continue metoprolol tartrate 25 mg as needed -refilled today. -Continue Xarelto 20 mg daily for stroke prevention --we will check for samples.  Advised him to follow-up on the patient assistance paperwork he submitted with his PCP. -Hold Xarelto 2 days prior to procedure.  Aortic stenosis -Moderate in 2021, normal  EF -Scheduled for 2D echo in October 2023 -Denies chest pain, shortness of breath, heart failure symptoms and syncope.  PAD -Followed by Dr. Oneida Alar -Denies claudication -Continue lipid therapy.  Hypertension, slightly elevated today -Continue diltiazem. -Goal BP is <130/80.  Recommend DASH diet (high in vegetables, fruits, low-fat dairy products, whole grains, poultry, fish, and nuts and low in sweets, sugar-sweetened beverages, and red meats), salt restriction and increase physical activity.  Hyperlipidemia with goal LDL <55, controlled -Lipids June 2023 with total cholesterol 115, triglycerides 65, HDL 58 and LDL 44. -Continue Crestor.  Ascending thoracic aortic dilation -4.1 cm on CTA in October 2022.  10m RUL pulm nodule noted.  Recommended to have repeat CTA in 1 year.  Tobacco use  -Recommend tobacco cessation.  Reviewed physiologic effects of nicotine and the immediate-eventual benefits of quitting including improvement in cough/breathing and reduction in cardiovascular events.  Discussed quitting tips such as removing triggers and getting support from family/friends and Quitline B and E. -USPSTF recommends one-time screening for abdominal aortic aneurysm (AAA) by ultrasound in men 671-764years old who have ever smoked.      Disposition - Follow-up echo in October and follow-up appointment with Dr. HPercival Spanishin December.  Medication Adjustments/Labs and Tests Ordered: Current medicines are reviewed at length with the patient today.  Concerns regarding medicines are outlined above.  Orders Placed This Encounter  Procedures   EKG 12-Lead   Meds ordered this encounter  Medications   metoprolol tartrate (LOPRESSOR) 25 MG tablet    Sig: Take 1 tablet (25 mg total) by mouth 2 (two) times daily as needed.    Dispense:  30 tablet    Refill:  1    Patient Instructions  Medication Instructions:  Your physician recommends that you continue on your current medications as directed.  Please refer to the Current Medication list given to you today.  Hold Xarelto for 2 days prior to procedure  *If you need a refill on your cardiac medications before your next appointment, please call your pharmacy*  Follow-Up: [facility] and your [facility] needs are our [facility].  As part of our continuing mission to provide you with exceptional heart care, we have created designated Provider Care Teams.  These Care Teams include your primary Cardiologist (physician) and Advanced Practice Providers (APPs -  Physician Assistants and Nurse Practitioners) who all work together to provide you with the care you need, when you need it.  We recommend signing up for the patient portal called "MyChart".  Sign up information is provided on this After Visit Summary.  MyChart is used to connect with patients for Virtual Visits (Telemedicine).  Patients are able to view lab/test results, encounter notes, upcoming appointments, etc.  Non-urgent messages can be sent to your provider as well.   To learn more about what you can do with MyChart, go to [email or web address]    Your next appointment:   December with Dr. HPercival Spanish Other Instructions Xarelto patient assistance #[phone]          Signed, JGaston Islam 04/12/2022 9:25 AM    [place] [place]

## 2022-04-11 ENCOUNTER — Ambulatory Visit: Admit: 2022-04-11 | Payer: Medicare HMO | Admitting: Otolaryngology

## 2022-04-11 SURGERY — LARYNGOSCOPY, DIRECT
Anesthesia: General | Laterality: Bilateral

## 2022-04-12 ENCOUNTER — Encounter: Payer: Self-pay | Admitting: Physician Assistant

## 2022-04-12 ENCOUNTER — Ambulatory Visit: Payer: Medicare HMO | Attending: Adult Health | Admitting: Physician Assistant

## 2022-04-12 VITALS — BP 142/80 | HR 71 | Ht 76.0 in | Wt 219.0 lb

## 2022-04-12 DIAGNOSIS — Z720 Tobacco use: Secondary | ICD-10-CM

## 2022-04-12 DIAGNOSIS — Z01818 Encounter for other preprocedural examination: Secondary | ICD-10-CM | POA: Diagnosis not present

## 2022-04-12 DIAGNOSIS — E785 Hyperlipidemia, unspecified: Secondary | ICD-10-CM | POA: Diagnosis not present

## 2022-04-12 DIAGNOSIS — I350 Nonrheumatic aortic (valve) stenosis: Secondary | ICD-10-CM | POA: Diagnosis not present

## 2022-04-12 DIAGNOSIS — I480 Paroxysmal atrial fibrillation: Secondary | ICD-10-CM | POA: Diagnosis not present

## 2022-04-12 DIAGNOSIS — I10 Essential (primary) hypertension: Secondary | ICD-10-CM | POA: Diagnosis not present

## 2022-04-12 MED ORDER — METOPROLOL TARTRATE 25 MG PO TABS: 25.0000 mg | ORAL_TABLET | Freq: Two times a day (BID) | ORAL | 1 refills | Status: DC | PRN

## 2022-04-12 NOTE — Patient Instructions (Signed)
Medication Instructions:  Your physician recommends that you continue on your current medications as directed. Please refer to the Current Medication list given to you today.  Hold Xarelto for 2 days prior to procedure  *If you need a refill on your cardiac medications before your next appointment, please call your pharmacy*  Follow-Up: At [facility], you and your health needs are our priority.  As part of our continuing mission to provide you with exceptional heart care, we have created designated Provider Care Teams.  These Care Teams include your primary Cardiologist (physician) and Advanced Practice Providers (APPs -  Physician Assistants and Nurse Practitioners) who all work together to provide you with the care you need, when you need it.  We recommend signing up for the patient portal called "MyChart".  Sign up information is provided on this After Visit Summary.  MyChart is used to connect with patients for Virtual Visits (Telemedicine).  Patients are able to view lab/test results, encounter notes, upcoming appointments, etc.  Non-urgent messages can be sent to your provider as well.   To learn more about what you can do with MyChart, go to [email or web address].    Your next appointment:   December with Dr. Percival Spanish  Other Instructions Xarelto patient assistance [phone]

## 2022-04-16 ENCOUNTER — Telehealth: Payer: Self-pay | Admitting: Pharmacist

## 2022-04-16 NOTE — Telephone Encounter (Signed)
Patient called as he never heard back about patient assistance for Xarelto. Patient said he mailed back his application but not sure to what office and unable to locate in his record. Will mail again and try to use a 30 day free trial card at the pharmacy as well.

## 2022-04-17 ENCOUNTER — Encounter (HOSPITAL_COMMUNITY): Payer: Self-pay | Admitting: Otolaryngology

## 2022-04-17 ENCOUNTER — Other Ambulatory Visit: Payer: Self-pay | Admitting: Otolaryngology

## 2022-04-17 ENCOUNTER — Other Ambulatory Visit: Payer: Self-pay

## 2022-04-17 NOTE — Progress Notes (Signed)
Spoke with pt for pre-op call. Pt was here on 04/04/22 for same surgery but it was cancelled because he needed cardiac clearance. Clearance done 04/12/22 - note in Epic. Pt states nothing has changed with his allergies, medications, medical or surgical history. Pt's last dose of Xarelto was 04/15/22 (evening dose).  Shower instructions given to pt and he voiced understanding.  See Jeneen Rinks' note

## 2022-04-17 NOTE — Progress Notes (Signed)
Anesthesia Chart Review:  Follows with cardiology for history of paroxysmal A. fib and moderate aortic stenosis.  He is maintained on Xarelto.  He had a negative stress test 02/15/2015.  Most recent echo October 2021 showed EF 60 to 65%, grade 1 DD, moderate AS (mean gradient 22.3 mmHg), moderate dilation of the ascending aorta (44 mm).  He was seen by [name], PA-C 04/12/2022 for preop evaluation.  Per note, "Cory [name] perioperative risk of a major cardiac event is 0.4% according to the Revised Cardiac Risk Index (RCRI).  Therefore, he is at low risk for perioperative complications.   His functional capacity is excellent at 7.59 METs according to the Duke Activity Status Index (DASI). Recommendations: According to ACC/AHA guidelines, no further cardiovascular testing needed.  The patient may proceed to surgery at acceptable risk. Antiplatelet and/or Anticoagulation Recommendations: Xarelto (Rivaroxaban) can be held for 2 days prior to surgery.  Please resume post op when felt to be safe.  Can restart Xarelto 1 day postop at surgeon's discretion."  Follows with vascular surgery for history of PAD s/p abdominal aortic stent graft with right common femoral endarterectomy with bovine patch angioplasty 08/29/2020, left above-knee to below-knee popliteal artery bypass due to an occluded popliteal artery aneurysm 11/22/2020.  Last seen 11/09/2021, per note EVAR duplex demonstrated a widely patent stent graft without any endoleaks, right iliac aneurysm continued to decrease in size, left leg bypass widely patent without any hemodynamically significant stenosis.  Recommended 1 year follow-up.  History of C5-C7 ACDF  BMP and CBC from 04/04/2022 reviewed, unremarkable.  EKG 04/12/2022: NSR.  Rate 71.  LAD.  Septal infarct, age undetermined.  CT soft tissue neck 11/08/2021: IMPRESSION: Subcentimeter focus of mucosal hyperenhancement along the posterior aspect of the uvula. Pharyngolaryngoscopy with direct  visualization recommended to exclude a mucosal lesion at this site. Additionally, there are a few tiny cystic-appearing foci along the left glossotonsillar sulcus. Direct visualization also recommended at this site.   Nonspecific mildly enlarged right level 2 lymph node (measuring 14 mm in short axis).   Multiple small calcific foci within the left palatine tonsil, which may reflect postinflammatory calcifications and/or tonsilloliths.   Prior C5-C7 ACDF.   Cervical spondylosis.  TTE 05/31/2020:  1. Technically difficult echo with poor image quality.   2. Left ventricular ejection fraction, by estimation, is 60 to 65%. The  left ventricle has normal function. The left ventricle has no regional  wall motion abnormalities. Left ventricular diastolic parameters are  consistent with Grade I diastolic  dysfunction (impaired relaxation).   3. Right ventricular systolic function is normal. The right ventricular  size is normal. There is normal pulmonary artery systolic pressure.   4. Left atrial size was mildly dilated.   5. The mitral valve is grossly normal. No evidence of mitral valve  regurgitation.   6. The aortic valve is calcified. Aortic valve regurgitation is not  visualized. Moderate aortic valve stenosis.   7. Aortic dilatation noted. There is moderate dilatation of the ascending  aorta, measuring 44 mm.  Exercise tolerance test 02/15/2015: There was no ST segment deviation noted during stress.    Chun, S[place] [facility]/Anesthesiology Phone [phone] 04/17/2022 2:19 PM

## 2022-04-17 NOTE — Anesthesia Preprocedure Evaluation (Signed)
Anesthesia Evaluation  Patient identified by MRN, date of birth, ID band Patient awake    Reviewed: Allergy & Precautions, NPO status , Patient's Chart, lab work & pertinent test results  Airway Mallampati: IV  TM Distance: >3 FB Neck ROM: Full    Dental  (+) Edentulous Upper, Edentulous Lower   Pulmonary Current Smoker and Patient abstained from smoking.,  10cig/d, 50 year history  No inhalers   Pulmonary exam normal breath sounds clear to auscultation       Cardiovascular hypertension (144/91 in preop, normally 130/80 per pt), Pt. on medications and Pt. on home beta blockers + Peripheral Vascular Disease  Normal cardiovascular exam+ dysrhythmias (xarelto, last dose 9/10) Atrial Fibrillation + Valvular Problems/Murmurs (mod AS on echo 2021) AS  Rhythm:Regular Rate:Normal  Echo 2021 1. Technically difficult echo with poor image quality.  2. Left ventricular ejection fraction, by estimation, is 60 to 65%. The  left ventricle has normal function. The left ventricle has no regional  wall motion abnormalities. Left ventricular diastolic parameters are  consistent with Grade I diastolic  dysfunction (impaired relaxation).  3. Right ventricular systolic function is normal. The right ventricular  size is normal. There is normal pulmonary artery systolic pressure.  4. Left atrial size was mildly dilated.  5. The mitral valve is grossly normal. No evidence of mitral valve  regurgitation.  6. The aortic valve is calcified. Aortic valve regurgitation is not  visualized. Moderate aortic valve stenosis.  7. Aortic dilatation noted. There is moderate dilatation of the ascending  aorta, measuring 44 mm.    Neuro/Psych negative psych ROS   GI/Hepatic negative GI ROS, Neg liver ROS,   Endo/Other  negative endocrine ROS  Renal/GU negative Renal ROS  negative genitourinary   Musculoskeletal  (+) Arthritis , Osteoarthritis,     Abdominal   Peds negative pediatric ROS (+)  Hematology negative hematology ROS (+)   Anesthesia Other Findings   Reproductive/Obstetrics negative OB ROS                            Anesthesia Physical Anesthesia Plan  ASA: 3  Anesthesia Plan: General   Post-op Pain Management: Tylenol PO (pre-op)*   Induction: Intravenous  PONV Risk Score and Plan: 1 and Ondansetron, Dexamethasone and Treatment may vary due to age or medical condition  Airway Management Planned: Oral ETT  Additional Equipment: None  Intra-op Plan:   Post-operative Plan: Extubation in OR  Informed Consent: I have reviewed the patients History and Physical, chart, labs and discussed the procedure including the risks, benefits and alternatives for the proposed anesthesia with the patient or authorized representative who has indicated his/her understanding and acceptance.     Dental advisory given  Plan Discussed with: CRNA  Anesthesia Plan Comments: ( )       Anesthesia Quick Evaluation

## 2022-04-17 NOTE — Progress Notes (Signed)
Revised Pre filled Patient assistance application on file to reflect current date to be mailed to patient with instructions for completion. Call to [place] to apply 30 Day voucher for Xarelto.

## 2022-04-17 NOTE — Chronic Care Management (AMB) (Signed)
Call to patient advised of the below, did not reach left a voicemail with my contact information.    [facility] Clinical Pharmacist Assistant [phone]

## 2022-04-18 ENCOUNTER — Ambulatory Visit (HOSPITAL_BASED_OUTPATIENT_CLINIC_OR_DEPARTMENT_OTHER): Payer: Medicare HMO | Admitting: Physician Assistant

## 2022-04-18 ENCOUNTER — Other Ambulatory Visit: Payer: Self-pay

## 2022-04-18 ENCOUNTER — Ambulatory Visit (HOSPITAL_COMMUNITY)
Admission: RE | Admit: 2022-04-18 | Discharge: 2022-04-18 | Disposition: A | Discharge status: Home / Self Care | Payer: Medicare HMO | Attending: Otolaryngology | Admitting: Otolaryngology

## 2022-04-18 ENCOUNTER — Encounter (HOSPITAL_COMMUNITY): Payer: Self-pay | Admitting: Otolaryngology

## 2022-04-18 ENCOUNTER — Ambulatory Visit (HOSPITAL_COMMUNITY): Payer: Medicare HMO | Admitting: Physician Assistant

## 2022-04-18 ENCOUNTER — Encounter (HOSPITAL_COMMUNITY)
Admission: RE | Disposition: A | Discharge status: Home / Self Care | Payer: Self-pay | Source: Home / Self Care | Attending: Otolaryngology

## 2022-04-18 DIAGNOSIS — F17210 Nicotine dependence, cigarettes, uncomplicated: Secondary | ICD-10-CM | POA: Diagnosis not present

## 2022-04-18 DIAGNOSIS — I10 Essential (primary) hypertension: Secondary | ICD-10-CM

## 2022-04-18 DIAGNOSIS — J312 Chronic pharyngitis: Secondary | ICD-10-CM

## 2022-04-18 DIAGNOSIS — C101 Malignant neoplasm of anterior surface of epiglottis: Secondary | ICD-10-CM | POA: Diagnosis not present

## 2022-04-18 DIAGNOSIS — C321 Malignant neoplasm of supraglottis: Secondary | ICD-10-CM | POA: Diagnosis not present

## 2022-04-18 DIAGNOSIS — M1990 Unspecified osteoarthritis, unspecified site: Secondary | ICD-10-CM

## 2022-04-18 DIAGNOSIS — J36 Peritonsillar abscess: Secondary | ICD-10-CM | POA: Diagnosis not present

## 2022-04-18 DIAGNOSIS — S1181XA Laceration without foreign body of other specified part of neck, initial encounter: Secondary | ICD-10-CM | POA: Diagnosis not present

## 2022-04-18 DIAGNOSIS — R070 Pain in throat: Secondary | ICD-10-CM | POA: Diagnosis present

## 2022-04-18 DIAGNOSIS — J358 Other chronic diseases of tonsils and adenoids: Secondary | ICD-10-CM | POA: Diagnosis not present

## 2022-04-18 DIAGNOSIS — I4891 Unspecified atrial fibrillation: Secondary | ICD-10-CM | POA: Diagnosis not present

## 2022-04-18 HISTORY — PX: TONSILLECTOMY: SHX5217

## 2022-04-18 HISTORY — PX: RIGID ESOPHAGOSCOPY: SHX5226

## 2022-04-18 HISTORY — PX: DIRECT LARYNGOSCOPY: SHX5326

## 2022-04-18 SURGERY — LARYNGOSCOPY, DIRECT
Anesthesia: General | Site: Throat | Laterality: Bilateral

## 2022-04-18 MED ORDER — EPINEPHRINE HCL (NASAL) 0.1 % NA SOLN
NASAL | Status: AC
  Filled 2022-04-18: qty 30

## 2022-04-18 MED ORDER — FENTANYL CITRATE (PF) 100 MCG/2ML IJ SOLN
INTRAMUSCULAR | Status: DC | PRN
  Administered 2022-04-18: 25 ug via INTRAVENOUS
  Administered 2022-04-18: 50 ug via INTRAVENOUS

## 2022-04-18 MED ORDER — ROCURONIUM BROMIDE 10 MG/ML (PF) SYRINGE
PREFILLED_SYRINGE | INTRAVENOUS | Status: AC
  Filled 2022-04-18: qty 10

## 2022-04-18 MED ORDER — OXYMETAZOLINE HCL 0.05 % NA SOLN
NASAL | Status: AC
  Filled 2022-04-18: qty 30

## 2022-04-18 MED ORDER — CEFAZOLIN SODIUM-DEXTROSE 2-4 GM/100ML-% IV SOLN
2.0000 g | INTRAVENOUS | Status: AC
  Administered 2022-04-18: 2 g via INTRAVENOUS

## 2022-04-18 MED ORDER — ORAL CARE MOUTH RINSE: 15.0000 mL | Freq: Once | OROMUCOSAL | Status: AC

## 2022-04-18 MED ORDER — LIDOCAINE 2% (20 MG/ML) 5 ML SYRINGE
INTRAMUSCULAR | Status: DC | PRN
  Administered 2022-04-18: 60 mg via INTRAVENOUS

## 2022-04-18 MED ORDER — EPHEDRINE 5 MG/ML INJ
INTRAVENOUS | Status: AC
  Filled 2022-04-18: qty 5

## 2022-04-18 MED ORDER — ONDANSETRON HCL 4 MG/2ML IJ SOLN: 4.0000 mg | Freq: Once | INTRAMUSCULAR | Status: DC | PRN

## 2022-04-18 MED ORDER — ONDANSETRON HCL 4 MG/2ML IJ SOLN
INTRAMUSCULAR | Status: DC | PRN
  Administered 2022-04-18: 4 mg via INTRAVENOUS

## 2022-04-18 MED ORDER — DEXAMETHASONE SODIUM PHOSPHATE 10 MG/ML IJ SOLN
INTRAMUSCULAR | Status: AC
  Filled 2022-04-18: qty 1

## 2022-04-18 MED ORDER — LIDOCAINE 2% (20 MG/ML) 5 ML SYRINGE
INTRAMUSCULAR | Status: AC
  Filled 2022-04-18: qty 5

## 2022-04-18 MED ORDER — AMISULPRIDE (ANTIEMETIC) 5 MG/2ML IV SOLN: 10.0000 mg | Freq: Once | INTRAVENOUS | Status: DC | PRN

## 2022-04-18 MED ORDER — DEXMEDETOMIDINE HCL IN NACL 80 MCG/20ML IV SOLN
INTRAVENOUS | Status: DC | PRN
  Administered 2022-04-18 (×3): 4 ug via BUCCAL

## 2022-04-18 MED ORDER — HYDROCODONE-ACETAMINOPHEN 7.5-325 MG/15ML PO SOLN: 15.0000 mL | Freq: Four times a day (QID) | ORAL | 0 refills | Status: DC | PRN

## 2022-04-18 MED ORDER — EPHEDRINE SULFATE-NACL 50-0.9 MG/10ML-% IV SOSY
PREFILLED_SYRINGE | INTRAVENOUS | Status: DC | PRN
  Administered 2022-04-18: 5 mg via INTRAVENOUS

## 2022-04-18 MED ORDER — LACTATED RINGERS IV SOLN: INTRAVENOUS | Status: DC

## 2022-04-18 MED ORDER — CHLORHEXIDINE GLUCONATE 0.12 % MT SOLN
OROMUCOSAL | Status: AC
  Administered 2022-04-18: 15 mL via OROMUCOSAL
  Filled 2022-04-18: qty 15

## 2022-04-18 MED ORDER — OXYCODONE HCL 5 MG PO TABS: 5.0000 mg | ORAL_TABLET | Freq: Once | ORAL | Status: DC | PRN

## 2022-04-18 MED ORDER — DEXAMETHASONE SODIUM PHOSPHATE 10 MG/ML IJ SOLN
INTRAMUSCULAR | Status: DC | PRN
  Administered 2022-04-18: 10 mg via INTRAVENOUS

## 2022-04-18 MED ORDER — ACETAMINOPHEN 10 MG/ML IV SOLN
INTRAVENOUS | Status: AC
  Filled 2022-04-18: qty 100

## 2022-04-18 MED ORDER — OXYCODONE HCL 5 MG/5ML PO SOLN: 5.0000 mg | Freq: Once | ORAL | Status: DC | PRN

## 2022-04-18 MED ORDER — CEFAZOLIN SODIUM-DEXTROSE 2-4 GM/100ML-% IV SOLN
INTRAVENOUS | Status: AC
  Filled 2022-04-18: qty 100

## 2022-04-18 MED ORDER — EPINEPHRINE HCL (NASAL) 0.1 % NA SOLN
NASAL | Status: DC | PRN
  Administered 2022-04-18: 10 mL via TOPICAL

## 2022-04-18 MED ORDER — SUGAMMADEX SODIUM 200 MG/2ML IV SOLN
INTRAVENOUS | Status: DC | PRN
  Administered 2022-04-18: 200 mg via INTRAVENOUS

## 2022-04-18 MED ORDER — ACETAMINOPHEN 10 MG/ML IV SOLN
INTRAVENOUS | Status: DC | PRN
  Administered 2022-04-18: 1000 mg via INTRAVENOUS

## 2022-04-18 MED ORDER — CHLORHEXIDINE GLUCONATE 0.12 % MT SOLN: 15.0000 mL | Freq: Once | OROMUCOSAL | Status: AC

## 2022-04-18 MED ORDER — MIDAZOLAM HCL 2 MG/2ML IJ SOLN
INTRAMUSCULAR | Status: AC
  Filled 2022-04-18: qty 2

## 2022-04-18 MED ORDER — ACETAMINOPHEN 500 MG PO TABS: 1000.0000 mg | ORAL_TABLET | Freq: Once | ORAL | Status: DC

## 2022-04-18 MED ORDER — ROCURONIUM BROMIDE 100 MG/10ML IV SOLN
INTRAVENOUS | Status: DC | PRN
  Administered 2022-04-18: 10 mg via INTRAVENOUS
  Administered 2022-04-18: 50 mg via INTRAVENOUS

## 2022-04-18 MED ORDER — PHENYLEPHRINE 80 MCG/ML (10ML) SYRINGE FOR IV PUSH (FOR BLOOD PRESSURE SUPPORT)
PREFILLED_SYRINGE | INTRAVENOUS | Status: DC | PRN
  Administered 2022-04-18: 80 ug via INTRAVENOUS
  Administered 2022-04-18 (×2): 160 ug via INTRAVENOUS
  Administered 2022-04-18: 80 ug via INTRAVENOUS

## 2022-04-18 MED ORDER — FENTANYL CITRATE (PF) 100 MCG/2ML IJ SOLN: 25.0000 ug | INTRAMUSCULAR | Status: DC | PRN

## 2022-04-18 MED ORDER — PROPOFOL 10 MG/ML IV BOLUS
INTRAVENOUS | Status: DC | PRN
  Administered 2022-04-18: 150 mg via INTRAVENOUS

## 2022-04-18 MED ORDER — FENTANYL CITRATE (PF) 250 MCG/5ML IJ SOLN
INTRAMUSCULAR | Status: AC
  Filled 2022-04-18: qty 5

## 2022-04-18 MED ORDER — ONDANSETRON HCL 4 MG/2ML IJ SOLN
INTRAMUSCULAR | Status: AC
  Filled 2022-04-18: qty 2

## 2022-04-18 MED ORDER — OXYMETAZOLINE HCL 0.05 % NA SOLN
NASAL | Status: DC | PRN
  Administered 2022-04-18: 1 via TOPICAL

## 2022-04-18 MED ORDER — GLYCOPYRROLATE 0.2 MG/ML IJ SOLN
INTRAMUSCULAR | Status: DC | PRN
  Administered 2022-04-18: .1 mg via INTRAVENOUS

## 2022-04-18 MED ORDER — MIDAZOLAM HCL 5 MG/5ML IJ SOLN
INTRAMUSCULAR | Status: DC | PRN
  Administered 2022-04-18: 2 mg via INTRAVENOUS

## 2022-04-18 SURGICAL SUPPLY — 38 items
BAG COUNTER SPONGE SURGICOUNT (BAG) ×2 IMPLANT
BALLN PULM 15 16.5 18X75 (BALLOONS)
BALLOON PULM 15 16.5 18X75 (BALLOONS) IMPLANT
BLADE SURG 15 STRL LF DISP TIS (BLADE) IMPLANT
BLADE SURG 15 STRL SS (BLADE)
CANISTER SUCT 3000ML PPV (MISCELLANEOUS) ×2 IMPLANT
CATH ROBINSON RED A/P 14FR (CATHETERS) IMPLANT
CNTNR URN SCR LID CUP LEK RST (MISCELLANEOUS) IMPLANT
COAGULATOR SUCT SWTCH 10FR 6 (ELECTROSURGICAL) IMPLANT
CONT SPEC 4OZ STRL OR WHT (MISCELLANEOUS)
COVER BACK TABLE 60X90IN (DRAPES) ×2 IMPLANT
COVER MAYO STAND STRL (DRAPES) ×2 IMPLANT
DRAPE HALF SHEET 40X57 (DRAPES) ×2 IMPLANT
GAUZE 4X4 16PLY ~~LOC~~+RFID DBL (SPONGE) ×2 IMPLANT
GAUZE SPONGE 4X4 12PLY STRL (GAUZE/BANDAGES/DRESSINGS) ×2 IMPLANT
GLOVE BIO SURGEON STRL SZ7.5 (GLOVE) ×2 IMPLANT
GOWN STRL REUS W/ TWL LRG LVL3 (GOWN DISPOSABLE) ×4 IMPLANT
GOWN STRL REUS W/TWL LRG LVL3 (GOWN DISPOSABLE) ×4
GUARD TEETH (MISCELLANEOUS) ×2 IMPLANT
KIT BASIN OR (CUSTOM PROCEDURE TRAY) ×2 IMPLANT
KIT TURNOVER KIT B (KITS) ×2 IMPLANT
NDL HYPO 25GX1X1/2 BEV (NEEDLE) IMPLANT
NDL TRANS ORAL INJECTION (NEEDLE) IMPLANT
NEEDLE HYPO 25GX1X1/2 BEV (NEEDLE) ×2 IMPLANT
NEEDLE TRANS ORAL INJECTION (NEEDLE) IMPLANT
NS IRRIG 1000ML POUR BTL (IV SOLUTION) ×2 IMPLANT
PAD ARMBOARD 7.5X6 YLW CONV (MISCELLANEOUS) ×4 IMPLANT
PATTIES SURGICAL .5 X3 (DISPOSABLE) IMPLANT
PENCIL FOOT CONTROL (ELECTRODE) IMPLANT
POSITIONER HEAD DONUT 9IN (MISCELLANEOUS) IMPLANT
SOL ANTI FOG 6CC (MISCELLANEOUS) ×2 IMPLANT
SOLUTION ANTI FOG 6CC (MISCELLANEOUS) ×2
SURGILUBE 2OZ TUBE FLIPTOP (MISCELLANEOUS) ×2 IMPLANT
SYR BULB EAR ULCER 3OZ GRN STR (SYRINGE) IMPLANT
SYR CONTROL 10ML LL (SYRINGE) IMPLANT
TOWEL GREEN STERILE FF (TOWEL DISPOSABLE) ×4 IMPLANT
TUBE CONNECTING 12X1/4 (SUCTIONS) ×2 IMPLANT
YANKAUER SUCT BULB TIP NO VENT (SUCTIONS) IMPLANT

## 2022-04-18 NOTE — Op Note (Signed)
Preop diagnosis: Throat pain Postop diagnosis: same Procedure: Direct laryngoscopy with biopsy, rigid esophagoscopy, left tonsillectomy Surgeon: Redmond Baseman Anesth: General endotracheal anesthesia Compl: None Indications: The patient is a 76 year old male with a history of left-sided throat pain without clear cause.  He is a long-time smoker.  A CT suggested a couple of abnormal areas.  He presents for surgical evaluation and biopsy. Findings: At the time of my laryngoscopy, there was a tear seen in the left laryngeal surface of the epiglottis that was biopsied.  The left lateral tongue base did not look abnormal but was biopsied.  The left tonsil did not appear abnormal but some tonsillith material within the crypts.  Upon removal, a small peritonsillar abscess was encountered and drained. Description:  After discussing risks, benefits, and alternatives, the patient was brought to the operating room and placed on the operative table in the supine position.  Anesthesia was induced and the patient was intubated by the anesthesia team without difficulty.  The eyes were taped closed and the bed was turned 90 degrees from anesthesia.  A damp gauze was placed over the upper gum.  A Storz laryngoscope was inserted and used to view the various areas of the pharynx and larynx including the endolarynx, post-cricoid area, pyriform sinuses, and vallecula.  Findings are noted above.  Biopsies were taken from the left laryngeal surface of the epiglottis and the left lateral tongue base.  After completion, the laryngoscope was removed and a rigid esophagoscope was inserted and passed down the esophagus keeping the lumen in view down to the distal esophagus.  The scope was then slowly backed out while evaluating the esophagus.  After completion, the esophagoscope was removed.  The oropharynx was exposed with a Crow-Davis retractor that was placed in suspension on the Mayo stand.   The left tonsil was grasped with a curved  Allis and retracted medially while a curvilinear incision was made with the Bovie electrocautery.  Dissection continued in the subcapsular plane until the tonsil was removed.  A pocket of pus was encountered inferiorly and drained with removal of the tonsil.  Bleeding was controlled using suction cautery.  A red rubber catheter was passed through the right nasal passage and pulled through the mouth to provide anterior retraction on the soft palate.  A laryngeal mirror was inserted to view the nasopharynx which was found to appear normal.  After this was completed, the red rubber catheter was removed and the mouth and nose were copiously irrigated with saline.  A flexible suction catheter was passed down the esophagus to suction out the stomach and esophagus.  The Crow-Davis retractor was taken out of suspension and removed from the patient's mouth.  He was then turned back to anesthesia for wake-up and was extubated and moved to the recovery room in stable condition.

## 2022-04-18 NOTE — H&P (Signed)
Cory [name] is an 76 y.o. male.   Chief Complaint: Throat pain HPI: 76 year old male with chronic throat pain radiating to the left ear and long-time smoker.  CT imaging suggested a couple of abnormal areas in the throat and he presents for laryngoscopy with biopsy.  Past Medical History:  Diagnosis Date   Aortic stenosis    a. Moderate by echo 05/2020.   Arthritis    right hip   Cancer ([place])    skin cancer nose, right left, upper left basel cell   Dysrhythmia    a-fib   PAF (paroxysmal atrial fibrillation) (HCC)    Peripheral arterial disease (HCC)    Tobacco abuse     Past Surgical History:  Procedure Laterality Date   ABDOMINAL AORTIC ENDOVASCULAR STENT GRAFT N/A 08/29/2020   Procedure: ABDOMINAL AORTIC ENDOVASCULAR STENT GRAFT;  Surgeon: Elam Dutch, MD;  Location: [place];  Service: Vascular;  Laterality: N/A;   ABDOMINAL AORTOGRAM W/LOWER EXTREMITY Bilateral 06/24/2020   Procedure: ABDOMINAL AORTOGRAM W/LOWER EXTREMITY;  Surgeon: Elam Dutch, MD;  Location: [place] [facility];  Service: Cardiovascular;  Laterality: Bilateral;   BACK SURGERY     2005   CERVICAL SPINE SURGERY     EMBOLECTOMY Right 08/29/2020   Procedure: POPLITEAL AND TIBIAL EMBOLECTOMY;  Surgeon: Elam Dutch, MD;  Location: [place];  Service: Vascular;  Laterality: Right;   EMBOLIZATION Right 06/24/2020   Procedure: EMBOLIZATION;  Surgeon: Elam Dutch, MD;  Location: [place] [facility];  Service: Cardiovascular;  Laterality: Right;   ENDARTERECTOMY FEMORAL Right 08/29/2020   Procedure: ENDARTERECTOMY FEMORAL;  Surgeon: Elam Dutch, MD;  Location: [place];  Service: Vascular;  Laterality: Right;   EYE SURGERY     FEMORAL ARTERY EXPLORATION Right 06/25/2020   Procedure: RIGHT GROIN EXPLORATION Evacuation of Hematoma  WITH REPAIR OF RIGHT Common FEMORAL ARTERY.;  Surgeon: Angelia Mould, MD;  Location: [place];  Service: Vascular;  Laterality: Right;   FEMORAL-POPLITEAL BYPASS GRAFT  Left 11/22/2020   Procedure: LEFT ABOVE KNEE-BELOW KNEE BYPASS TIBIAL PERITONEAL TRUNK REVERSE IPSILATERAL GREATER SAPHENOUS VEIN;  Surgeon: Elam Dutch, MD;  Location: [place];  Service: Vascular;  Laterality: Left;   LEG SURGERY     Trauma   LOWER EXTREMITY ANGIOGRAPHY Left 11/17/2020   Procedure: Lower Extremity Angiography;  Surgeon: Marty Heck, MD;  Location: [facility] [facility];  Service: Cardiovascular;  Laterality: Left;   PATCH ANGIOPLASTY Right 08/29/2020   Procedure: PATCH ANGIOPLASTY RIGHT BELOW KNEE POPLITEAL ARTERY AND RIGHT COMMON FEMORAL ARTERY;  Surgeon: Elam Dutch, MD;  Location: [facility] OR;  Service: Vascular;  Laterality: Right;   RETINAL DETACHMENT SURGERY     ULTRASOUND GUIDANCE FOR VASCULAR ACCESS Bilateral 08/29/2020   Procedure: ULTRASOUND GUIDANCE FOR VASCULAR ACCESS;  Surgeon: Elam Dutch, MD;  Location: [facility] OR;  Service: Vascular;  Laterality: Bilateral;    Family History  Problem Relation Age of Onset   Ovarian cancer Mother    Alcoholism Father    Breast cancer Sister    Ovarian cancer Sister    Social History:  reports that he has been smoking cigarettes. He has a 25.00 pack-year smoking history. He has never been exposed to tobacco smoke. He has never used smokeless tobacco. He reports current alcohol use. He reports that he does not use drugs.  Allergies:  Allergies  Allergen Reactions   Zithromax [Azithromycin] Diarrhea    Medications Prior to Admission  Medication Sig Dispense  Refill   acetaminophen (TYLENOL) 500 MG tablet Take 500 mg by mouth every 6 (six) hours as needed (for pain.).     cetirizine (ZYRTEC) 10 MG tablet Take 1 tablet (10 mg total) by mouth daily. 30 tablet 11   cholecalciferol (VITAMIN D) 25 MCG (1000 UNIT) tablet Take 1,000 Units by mouth 2 (two) times daily.     diltiazem (TIADYLT ER) 120 MG 24 hr capsule TAKE 1 CAPSULE EVERY DAY (SCHEDULE OFFICE VISIT FOR FUTURE REFILLS) 90 capsule 1   rivaroxaban (XARELTO) 20  MG TABS tablet TAKE 1 TABLET BY MOUTH ONCE DAILY WITH SUPPER 90 tablet 1   rosuvastatin (CRESTOR) 10 MG tablet Take 1 tablet (10 mg total) by mouth daily. (Patient taking differently: Take 10 mg by mouth every evening.) 90 tablet 3   metoprolol tartrate (LOPRESSOR) 25 MG tablet Take 1 tablet (25 mg total) by mouth 2 (two) times daily as needed. 30 tablet 1    No results found for this or any previous visit (from the past 48 hour(s)). No results found.  Review of Systems  HENT:  Positive for sore throat.   All other systems reviewed and are negative.   Blood pressure (!) 144/91, pulse 68, temperature 97.6 F (36.4 C), temperature source Oral, resp. rate 17, height '6\' 4"'$  (1.93 m), weight 102.1 kg, SpO2 96 %. Physical Exam Constitutional:      Appearance: Normal appearance. He is normal weight.  HENT:     Head: Normocephalic and atraumatic.     Right Ear: External ear normal.     Left Ear: External ear normal.     Nose: Nose normal.     Mouth/Throat:     Mouth: Mucous membranes are moist.     Pharynx: Oropharynx is clear.  Eyes:     Extraocular Movements: Extraocular movements intact.     Conjunctiva/sclera: Conjunctivae normal.     Pupils: Pupils are equal, round, and reactive to light.  Cardiovascular:     Rate and Rhythm: Normal rate.  Pulmonary:     Effort: Pulmonary effort is normal.  Musculoskeletal:     Cervical back: Normal range of motion.  Skin:    General: Skin is warm and dry.  Neurological:     General: No focal deficit present.     Mental Status: He is alert and oriented to person, place, and time.  Psychiatric:        Mood and Affect: Mood normal.        Behavior: Behavior normal.        Thought Content: Thought content normal.        Judgment: Judgment normal.      Assessment/Plan Throat pain  To OR for direct laryngoscopy with biopsy.  Melida Quitter, MD 04/18/2022, 9:22 AM

## 2022-04-18 NOTE — Brief Op Note (Signed)
04/18/2022  10:45 AM  PATIENT:  Cory [name]  76 y.o. male  PRE-OPERATIVE DIAGNOSIS:  CHRONIC SORE THROAT  POST-OPERATIVE DIAGNOSIS:  CHRONIC SORE THROAT  PROCEDURE:  Procedure(s): DIRECT LARYNGOSCOPY WITH BIOPSIES (Bilateral) RIGID ESOPHAGOSCOPY LEFT TONSILLECTOMY  SURGEON:  Surgeon(s) and Role:    Melida Quitter, MD - Primary  PHYSICIAN ASSISTANT:   ASSISTANTS: none   ANESTHESIA:   general  EBL:  Minimal   BLOOD ADMINISTERED:none  DRAINS: none   LOCAL MEDICATIONS USED:  NONE  SPECIMEN:  Source of Specimen:  Left laryngeal surface of epiglottis, left lateral tongue base, and left tonsil.  DISPOSITION OF SPECIMEN:  PATHOLOGY  COUNTS:  YES  TOURNIQUET:  * No tourniquets in log *  DICTATION: .Note written in [place]: Discharge to home after PACU  PATIENT DISPOSITION:  PACU - hemodynamically stable.   Delay start of Pharmacological VTE agent (>24hrs) due to surgical blood loss or risk of bleeding: yes

## 2022-04-18 NOTE — Transfer of Care (Signed)
Immediate Anesthesia Transfer of Care Note  Patient: Cory [name]  Procedure(s) Performed: DIRECT LARYNGOSCOPY WITH BIOPSIES (Bilateral: Throat) RIGID ESOPHAGOSCOPY (Throat) LEFT TONSILLECTOMY (Throat)  Patient Location: PACU  Anesthesia Type:General  Level of Consciousness: awake and patient cooperative  Airway & Oxygen Therapy: Patient Spontanous Breathing  Post-op Assessment: Report given to RN and Post -op Vital signs reviewed and stable  Post vital signs: Reviewed and stable  Last Vitals:  Vitals Value Taken Time  BP 121/64 04/18/22 1100  Temp 36.7 C 04/18/22 1055  Pulse 68 04/18/22 1100  Resp 8 04/18/22 1100  SpO2 92 % 04/18/22 1100  Vitals shown include unvalidated device data.  Last Pain:  Vitals:   04/18/22 1055  TempSrc:   PainSc: 0-No pain      Patients Stated Pain Goal: 3 (44/71/58 0638)  Complications: No notable events documented.

## 2022-04-18 NOTE — Anesthesia Procedure Notes (Signed)
Procedure Name: Intubation Date/Time: 04/18/2022 9:53 AM  Performed by: Gwyndolyn Saxon, CRNAPre-anesthesia Checklist: Patient identified, Emergency Drugs available, Suction available and Patient being monitored Patient Re-evaluated:Patient Re-evaluated prior to induction Oxygen Delivery Method: Circle system utilized Preoxygenation: Pre-oxygenation with 100% oxygen Induction Type: IV induction Ventilation: Mask ventilation without difficulty and Oral airway inserted - appropriate to patient size Laryngoscope Size: Sabra Heck and 2 Grade View: Grade I Tube type: Oral Tube size: 8.0 mm Number of attempts: 1 Airway Equipment and Method: Stylet Placement Confirmation: ETT inserted through vocal cords under direct vision, positive ETCO2 and breath sounds checked- equal and bilateral Secured at: 24 cm Tube secured with: Tape Dental Injury: Teeth and Oropharynx as per pre-operative assessment

## 2022-04-18 NOTE — Anesthesia Postprocedure Evaluation (Signed)
Anesthesia Post Note  Patient: Cory [name]  Procedure(s) Performed: DIRECT LARYNGOSCOPY WITH BIOPSIES (Bilateral: Throat) RIGID ESOPHAGOSCOPY (Throat) LEFT TONSILLECTOMY (Throat)     Patient location during evaluation: PACU Anesthesia Type: General Level of consciousness: awake and alert, oriented and patient cooperative Pain management: pain level controlled Vital Signs Assessment: post-procedure vital signs reviewed and stable Respiratory status: spontaneous breathing, nonlabored ventilation and respiratory function stable Cardiovascular status: blood pressure returned to baseline and stable Postop Assessment: no apparent nausea or vomiting Anesthetic complications: no   No notable events documented.  Last Vitals:  Vitals:   04/18/22 1115 04/18/22 1129  BP: 122/69 131/85  Pulse: 61 67  Resp: (!) 9 12  Temp:  36.6 C  SpO2: 92% 91%    Last Pain:  Vitals:   04/18/22 1129  TempSrc:   PainSc: 0-No pain                 Pervis Hocking

## 2022-04-19 ENCOUNTER — Encounter (HOSPITAL_COMMUNITY): Payer: Self-pay | Admitting: Otolaryngology

## 2022-04-20 LAB — SURGICAL PATHOLOGY

## 2022-04-26 ENCOUNTER — Telehealth: Payer: Self-pay | Admitting: Hematology and Oncology

## 2022-04-26 NOTE — Chronic Care Management (AMB) (Signed)
Call to patient due to schedule conflict for upcoming appointment with Jeni Salles offered an alternate date patient was in agreement also advised him of the CO-pay card on file with his pharmacy for the [place]. He reports he dropped off his paperwork to the prescriber on today.    [place] Clinical Pharmacist Assistant [phone]

## 2022-04-26 NOTE — Telephone Encounter (Signed)
Scheduled appt per 9/21 referral. Pt is aware of appt date and time. Pt is aware to arrive 15 mins prior to appt time and to bring and updated insurance card. Pt is aware of appt location.

## 2022-04-30 ENCOUNTER — Other Ambulatory Visit: Payer: Self-pay

## 2022-04-30 DIAGNOSIS — C329 Malignant neoplasm of larynx, unspecified: Secondary | ICD-10-CM

## 2022-04-30 NOTE — Progress Notes (Signed)
Oncology Nurse Navigator Documentation   Placed introductory call to new referral patient Cory [name].  Introduced myself as the H&N oncology nurse navigator that works with Dr. Isidore Moos and Dr. Chryl Heck to whom he has been referred by Dr. Redmond Baseman. He confirmed understanding of referral. Briefly explained my role as his navigator, provided my contact information.  Confirmed understanding of upcoming appts and [place] location, explained arrival and registration process. He knows to expect a call from our radiation oncology schedulers to see Dr. Isidore Moos next week.  I advised him of his scheduled PET scan on 10/2 at 3:00. He is aware that he needs to have nothing by mouth starting at 9:00.  I explained the purpose of a dental evaluation prior to starting RT, indicated he would be contacted by [facility] to arrange an appt. He informed me that he wears dentures.  I encouraged him to call with questions/concerns as he moves forward with appts and procedures.   He verbalized understanding of information provided, expressed appreciation for my call.   Navigator Initial Assessment Employment Status: he is retired Currently on [employer] / STD: na Living Situation: he lives with his wife Support System: wife, family PCP: Carolann Littler MD PCD: none Financial Concerns:no Transportation Needs: no Sensory Deficits:no Engineer, building services Needed:  no Ambulation Needs: no DME Used in Home: no Psychosocial Needs:  no Concerns/Needs Understanding Cancer:  addressed/answered by navigator to best of ability Self-Expressed Needs: no   Clinical biochemist, BSN, OCN Head & Neck Oncology Nurse [place] [facility] Phone # [phone]  Fax # [phone]

## 2022-05-01 NOTE — Progress Notes (Signed)
Head and Neck Cancer Location of Tumor / Histology  Cancer of supraglottis   PET Scan 05/07/2022 --IMPRESSION: Asymmetric Hypermetabolism in the left oropharynx and involving the left epiglottis. Imaging features compatible with primary neoplasm. Contralateral (right-sided) hypermetabolic level II cervical lymph nodes compatible with metastatic disease. No hypermetabolic lymphadenopathy in the left neck. No hypermetabolic lymphadenopathy in the chest. 1.2 cm short axis aortocaval lymph node in the upper abdomen is hypermetabolic. Metastatic disease is a concern. This lymph node has increased from 0.7 cm on a study from 2022. No evidence for hypermetabolic metastatic disease in the pelvis. Emphysema. ([id number]-T65.9) Aortic Atherosclerois (ICD10-170.0)    Biopsies revealed:  9/13/202 FINAL MICROSCOPIC DIAGNOSIS:  A. SOFT TISSUE, LEFT LARYNGEAL SURFACE OF EPIGLOTTIS, BIOPSY:  - Invasive moderately differentiated squamous cell carcinoma, see comment  B. TONGUE, LEFT LATERAL BASE, BIOPSY:  - Benign squamous mucosa with low to intermediate grade dysplasia  - Negative for carcinoma  C. TONSIL, LEFT, TONSILLECTOMY:  - Benign tonsil with purulent inflammation within the crypts  COMMENT:  A.  Dr. Donneta Romberg reviewed the case and concurs with the diagnosis.  Dr. Redmond Baseman was notified on 04/20/2022.   Nutrition Status Yes No Comments  Weight changes? '[x]'$  '[]'$  Wt Readings from Last 3 Encounters:  05/08/22 213 lb (96.6 kg)  05/07/22 220 lb (99.8 kg)  04/18/22 225 lb (102.1 kg)    Swallowing concerns? '[]'$  '[x]'$    PEG? '[]'$  '[x]'$     Referrals Yes No Comments  Social Work? '[x]'$  '[]'$    Dentistry? '[x]'$  '[]'$    Swallowing therapy? '[x]'$  '[]'$    Nutrition? '[x]'$  '[]'$    Med/Onc? '[x]'$  '[]'$     Safety Issues Yes No Comments  Prior radiation? '[]'$  '[x]'$    Pacemaker/ICD? '[]'$  '[x]'$    Possible current pregnancy? '[]'$  '[x]'$  N/A  Is the patient on methotrexate? '[]'$  '[x]'$     Tobacco/Marijuana/Snuff/ETOH use: Patient is a current every day smoker  (~0.5 pack/day) and drinks alcohol regularly. Denies any recreational drug use  Past/Anticipated interventions by otolaryngology, if any:  04/18/2022  Dr. Redmond Baseman DIRECT LARYNGOSCOPY WITH BIOPSIES Bilateral General  RIGID ESOPHAGOSCOPY  General  LEFT TONSILLECTOMY     Past/Anticipated interventions by medical oncology, if any:  Scheduled for consultation with Dr. Benay Pike later this morning   Current Complaints / other details:  Nothing else of note

## 2022-05-04 DIAGNOSIS — C321 Malignant neoplasm of supraglottis: Secondary | ICD-10-CM | POA: Insufficient documentation

## 2022-05-06 DIAGNOSIS — C321 Malignant neoplasm of supraglottis: Secondary | ICD-10-CM

## 2022-05-06 HISTORY — DX: Malignant neoplasm of supraglottis: C32.1

## 2022-05-07 ENCOUNTER — Encounter (HOSPITAL_COMMUNITY)
Admission: RE | Admit: 2022-05-07 | Discharge: 2022-05-07 | Disposition: A | Discharge status: Home / Self Care | Payer: Medicare HMO | Source: Ambulatory Visit | Attending: Radiation Oncology | Admitting: Radiation Oncology

## 2022-05-07 DIAGNOSIS — C760 Malignant neoplasm of head, face and neck: Secondary | ICD-10-CM | POA: Diagnosis not present

## 2022-05-07 DIAGNOSIS — C329 Malignant neoplasm of larynx, unspecified: Secondary | ICD-10-CM | POA: Insufficient documentation

## 2022-05-07 LAB — GLUCOSE, CAPILLARY: Glucose-Capillary: 108 mg/dL — ABNORMAL HIGH (ref 70–99)

## 2022-05-07 MED ORDER — FLUDEOXYGLUCOSE F - 18 (FDG) INJECTION
11.2000 | Freq: Once | INTRAVENOUS | Status: AC
  Administered 2022-05-07: 10.93 via INTRAVENOUS

## 2022-05-07 NOTE — Progress Notes (Signed)
Radiation Oncology         [phone] ________________________________  Initial Outpatient Consultation  Name: Cory [name] MRN: [id number]  Date: 05/08/2022  DOB: [birth year]  [id number], Alinda Sierras, MD  Melida Quitter, MD   REFERRING PHYSICIAN: Melida Quitter, MD  DIAGNOSIS:    ICD-10-CM   1. Cancer of supraglottis ([place])  C32.1     2. 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 male who initially presented to Dr. Redmond Baseman on 08/15/21 with the cc of persistent left -sided sore throat since August of 2022. During this initial visit, the patient detailed his throat pain as aggravated by acidic foods, and radiating to his left ear. He also reported that he was given prednisone for this several months prior which helped his pain slightly. Laryngoscopy performed during this visit was unremarkable, and the patient was advised on smoking cessation and given omeprazole for empiric treatment of potential acid reflux.  Soft tissue neck CT on 11/08/21 revealed a sub-centimeter focus of mucosal hyperenhancement along the posterior aspect of the uvula, and a few tiny cystic appearing foci along the left glosso-tonsillar sulcus. CT also showed a nonspecific mildly enlarged right  level 2 lymph node measuring 14 mm in the short axis, and multiple small calcific foci within the left palatine tonsil possibly reflective of postinflammatory calcifications and/or tonsilloliths.    Given findings on CT, Dr. Redmond Baseman performed a repeat laryngoscopy on 11/21/21 which was again unremarkable. The patient also reported persistent sore throat pain on that date. Given CT findings and persistent pain, Dr. Redmond Baseman advised proceeding with direct laryngoscopy and rigid esophagoscopy with biopsies.   Left laryngeal epiglottis tissue biopsies collected on 04/18/22 showed invasive moderately differentiated squamous cell carcinoma. Biopsies of the left lateral tongue base and left tonsil showed benign findings.  Per op note: " At the time of my laryngoscopy, there was a tear seen in the left laryngeal surface of the epiglottis that was biopsied.  The left lateral tongue base did not look abnormal but was biopsied.  The left tonsil did not appear abnormal but some tonsillith material within the crypts.  Upon removal, a small peritonsillar abscess was encountered and drained."  During a recent post-op visit on 05/04/22, Dr. Redmond Baseman discussed the potential surgical option of supraglottic laryngectomy followed by possible radiation.  Dr. Redmond Baseman wanted me to get my opinion regarding definitive radiation.     Pertinent imaging thus far includes a PET scan yesterday (05/07/22). Results demonstrate  - Asymmetric Hypermetabolism in the left oropharynx and involving the left epiglottis. Imaging features compatible with primary neoplasm. (I believe some of the oropharynx SUV uptake could be r/t abscess, recent biopsy) - Contralateral (right-sided) hypermetabolic level II cervical lymph nodes compatible with metastatic  disease. No hypermetabolic lymphadenopathy in the left neck. -1.2 cm short axis aortocaval lymph node in the upper abdomen is hypermetabolic. Metastatic disease is a concern. This lymph node has increased  from 0.7 cm on a study from 2022. (I've messaged GI providers to see if this could be bx'd by endoscopy)  Swallowing issues, if any: none  Weight Changes: lost ~5lbs per pt report Wt Readings from Last 3 Encounters:  05/08/22 214 lb 4.8 oz (97.2 kg)  05/08/22 213 lb (96.6 kg)  05/07/22 220 lb (99.8 kg)    Pain status: left sided sore throat pain for over 1 year   Other symptoms: the patient detailed his throat pain as aggravated by acidic foods, and radiating to his left ear  Tobacco history, if any: smoked ~1 pack a day for approximately 40 years, less more recently, but still at least 10 cigs/day  ETOH abuse, if any: reports drinking a "fair" amount of alcohol  Prior cancers, if any: none  PREVIOUS RADIATION THERAPY: No  PAST MEDICAL HISTORY:  has a past medical history of Aortic stenosis, Arthritis, Cancer ([place]), Dysrhythmia, PAF (paroxysmal atrial fibrillation) ([place]), Peripheral arterial disease ([place]), and Tobacco abuse.    PAST SURGICAL HISTORY: Past Surgical History:  Procedure Laterality Date   ABDOMINAL AORTIC ENDOVASCULAR STENT GRAFT N/A 08/29/2020   Procedure: ABDOMINAL AORTIC ENDOVASCULAR STENT GRAFT;  Surgeon: Elam Dutch, MD;  Location: [place] [facility];  Service: Vascular;  Laterality: N/A;   ABDOMINAL AORTOGRAM W/LOWER EXTREMITY Bilateral 06/24/2020   Procedure: ABDOMINAL AORTOGRAM W/LOWER EXTREMITY;  Surgeon: Elam Dutch, MD;  Location: [place] [facility];  Service: Cardiovascular;  Laterality: Bilateral;   BACK SURGERY     2005   CERVICAL SPINE SURGERY     DIRECT LARYNGOSCOPY Bilateral 04/18/2022   Procedure: DIRECT LARYNGOSCOPY WITH BIOPSIES;  Surgeon: Melida Quitter, MD;  Location: [place];  Service: ENT;  Laterality: Bilateral;   EMBOLECTOMY Right 08/29/2020   Procedure: POPLITEAL AND TIBIAL EMBOLECTOMY;  Surgeon: Elam Dutch, MD;  Location: [place];  Service: Vascular;  Laterality: Right;   EMBOLIZATION Right 06/24/2020   Procedure: EMBOLIZATION;  Surgeon:  Elam Dutch, MD;  Location: [place] [facility];  Service: Cardiovascular;  Laterality: Right;   ENDARTERECTOMY FEMORAL Right 08/29/2020   Procedure: ENDARTERECTOMY FEMORAL;  Surgeon: Elam Dutch, MD;  Location: [place];  Service: Vascular;  Laterality: Right;   EYE SURGERY     FEMORAL ARTERY EXPLORATION Right 06/25/2020   Procedure: RIGHT GROIN EXPLORATION Evacuation of Hematoma  WITH REPAIR OF RIGHT Common FEMORAL ARTERY.;  Surgeon: Angelia Mould, MD;  Location: [facility];  Service: Vascular;  Laterality: Right;   FEMORAL-POPLITEAL BYPASS GRAFT Left 11/22/2020   Procedure: LEFT ABOVE KNEE-BELOW KNEE BYPASS TIBIAL PERITONEAL TRUNK REVERSE IPSILATERAL GREATER SAPHENOUS VEIN;  Surgeon: Elam Dutch, MD;  Location: [place];  Service: Vascular;  Laterality: Left;   LEG SURGERY     Trauma   LOWER EXTREMITY ANGIOGRAPHY Left 11/17/2020   Procedure: Lower Extremity Angiography;  Surgeon: Marty Heck, MD;  Location: [place] [facility];  Service: Cardiovascular;  Laterality: Left;   PATCH ANGIOPLASTY Right 08/29/2020   Procedure: PATCH ANGIOPLASTY RIGHT BELOW KNEE POPLITEAL ARTERY AND RIGHT COMMON FEMORAL ARTERY;  Surgeon: Elam Dutch, MD;  Location: [place];  Service: Vascular;  Laterality: Right;   RETINAL DETACHMENT SURGERY     RIGID ESOPHAGOSCOPY  04/18/2022   Procedure: RIGID ESOPHAGOSCOPY;  Surgeon: Melida Quitter, MD;  Location: [place];  Service:  ENT;;   TONSILLECTOMY  04/18/2022   Procedure: LEFT TONSILLECTOMY;  Surgeon: Melida Quitter, MD;  Location: [place];  Service: ENT;;   ULTRASOUND GUIDANCE FOR VASCULAR ACCESS Bilateral 08/29/2020   Procedure: ULTRASOUND GUIDANCE FOR VASCULAR ACCESS;  Surgeon: Elam Dutch, MD;  Location: [facility];  Service: Vascular;  Laterality: Bilateral;    FAMILY HISTORY: family history includes Alcoholism in his father; Breast cancer in his sister; Ovarian cancer in his mother and sister.  SOCIAL HISTORY:  reports that he has been smoking  cigarettes. He has a 25.00 pack-year smoking history. He has never been exposed to tobacco smoke. He has never used smokeless tobacco. He reports current alcohol use. He reports that he does not use drugs.  ALLERGIES: Zithromax [azithromycin]  MEDICATIONS:  Current Outpatient Medications  Medication Sig Dispense Refill   nicotine (NICODERM CQ - DOSED IN MG/24 HOURS) 14 mg/24hr patch Place 1 patch (14 mg total) onto the skin daily. Apply 21 mg patch daily x 6 wk, then '14mg'$  patch daily x 2 wk, then 7 mg patch daily x 2 wk 14 patch 0   nicotine (NICODERM CQ - DOSED IN MG/24 HOURS) 21 mg/24hr patch Place 1 patch (21 mg total) onto the skin daily. Apply 21 mg patch daily x 6 wk, then '14mg'$  patch daily x 2 wk, then 7 mg patch daily x 2 wk 14 patch 2   nicotine (NICODERM CQ - DOSED IN MG/24 HR) 7 mg/24hr patch Place 1 patch (7 mg total) onto the skin daily. Apply 21 mg patch daily x 6 wk, then '14mg'$  patch daily x 2 wk, then 7 mg patch daily x 2 wk 14 patch 0   acetaminophen (TYLENOL) 500 MG tablet Take 500 mg by mouth every 6 (six) hours as needed (for pain.).     cetirizine (ZYRTEC) 10 MG tablet Take 1 tablet (10 mg total) by mouth daily. 30 tablet 11   cholecalciferol (VITAMIN D) 25 MCG (1000 UNIT) tablet Take 1,000 Units by mouth 2 (two) times daily.     diltiazem (TIADYLT ER) 120 MG 24 hr capsule TAKE 1 CAPSULE EVERY DAY (SCHEDULE OFFICE VISIT FOR FUTURE REFILLS) 90 capsule 1   HYDROcodone-acetaminophen (HYCET) 7.5-325 mg/15 ml solution Take 15 mLs by mouth every 6 (six) hours as needed for moderate pain. 250 mL 0   metoprolol tartrate (LOPRESSOR) 25 MG tablet Take 1 tablet (25 mg total) by mouth 2 (two) times daily as needed. 30 tablet 1   rivaroxaban (XARELTO) 20 MG TABS tablet TAKE 1 TABLET BY MOUTH ONCE DAILY WITH SUPPER 90 tablet 1   rosuvastatin (CRESTOR) 10 MG tablet Take 1 tablet (10 mg total) by mouth daily. (Patient taking differently: Take 10 mg by mouth every evening.) 90 tablet 3   No  current facility-administered medications for this encounter.    REVIEW OF SYSTEMS:  Notable for that above.   PHYSICAL EXAM:  height is '6\' 4"'$  (1.93 m) and weight is 213 lb (96.6 kg). His temperature is 97.8 F (36.6 C). His respiration is 18 and oxygen saturation is 100%.   General: Alert and oriented, in no acute distress HEENT: Head is normocephalic. Extraocular movements are intact. Oropharynx is notable for no obvious tumor but gag reflex is strong and it affects exam. Full dentures removed. Neck: Neck is notable for  no masses to palpable Heart: Regular in rate and rhythm with no murmurs, rubs, or gallops. Chest: Clear to auscultation bilaterally, with no rhonchi, wheezes, or rales. Abdomen: Soft,  nontender, nondistended, with no rigidity or guarding. Extremities: No cyanosis or edema. Lymphatics: see Neck Exam Skin: No concerning lesions. Musculoskeletal: symmetric strength and muscle tone throughout. Neurologic: Cranial nerves II through XII are grossly intact. No obvious focalities. Speech is fluent. Coordination is intact. Psychiatric: Judgment and insight are intact. Affect is appropriate.  PROCEDURE NOTE: After obtaining consent and spraying nasal cavity with topical oxymetazoline, the flexible endoscope was introduced and passed through the nasal cavity.  The nasopharynx, oropharynx, hypopharynx, and larynx  were then examined.  Nasopharynx, right, notable for: nonspecific sub centimeter patch of erythematous mucosa, no obvious tumor. Base of left epiglottis notable for rough, raised, whitish mucosa (could be partly r/t recent biopsy). The true cords were symmetrically mobile without nodularity.  No other notable findings   ECOG = 1  0 - Asymptomatic (Fully active, able to carry on all predisease activities without restriction)  1 - Symptomatic but completely ambulatory (Restricted in physically strenuous activity but ambulatory and able to carry out work of a light or  sedentary nature. For example, light housework, office work)  2 - Symptomatic, <50% in bed during the day (Ambulatory and capable of all self care but unable to carry out any work activities. Up and about more than 50% of waking hours)  3 - Symptomatic, >50% in bed, but not bedbound (Capable of only limited self-care, confined to bed or chair 50% or more of waking hours)  4 - Bedbound (Completely disabled. Cannot carry on any self-care. Totally confined to bed or chair)  5 - Death   Eustace Pen MM, Creech RH, Tormey DC, et al. [id number]). "Toxicity and response criteria of the [facility]". [place] Oncol. 5 (6): 649-55   LABORATORY DATA:  Lab Results  Component Value Date   WBC 6.2 05/08/2022   HGB 15.7 05/08/2022   HCT 45.8 05/08/2022   MCV 95.4 05/08/2022   PLT 203 05/08/2022   CMP     Component Value Date/Time   NA 137 04/04/2022 0720   NA 141 05/16/2018 1425   K 4.1 04/04/2022 0720   CL 103 04/04/2022 0720   CO2 23 04/04/2022 0720   GLUCOSE 96 04/04/2022 0720   BUN 13 04/04/2022 0720   BUN 13 05/16/2018 1425   CREATININE 0.94 04/04/2022 0720   CALCIUM 8.7 (L) 04/04/2022 0720   PROT 6.7 01/09/2022 0934   ALBUMIN 3.8 01/09/2022 0934   AST 16 01/09/2022 0934   ALT 19 01/09/2022 0934   ALKPHOS 43 01/09/2022 0934   BILITOT 0.7 01/09/2022 0934   GFRNONAA >60 04/04/2022 0720   GFRAA 94 05/16/2018 1425      Lab Results  Component Value Date   TSH 0.77 03/08/2015     RADIOGRAPHY: NM PET Image Initial (PI) Skull Base To Thigh  Result Date: 05/08/2022 CLINICAL DATA:  Subsequent treatment strategy for head and neck cancer. EXAM: NUCLEAR MEDICINE PET SKULL BASE TO THIGH TECHNIQUE: 10.9 mCi F-18 FDG was injected intravenously. Full-ring PET imaging was performed from the skull base to thigh after the radiotracer. CT data was obtained and used for attenuation correction and anatomic localization. Fasting blood glucose: 108 mg/dl COMPARISON:  CT a chest  05/29/2021. Neck CT 11/08/2021. CTA abdomen and pelvis 09/15/2020. FINDINGS: Mediastinal blood pool activity: SUV max 2.4 Liver activity: SUV max NA NECK: Asymmetric Hypermetabolism in the left oropharynx may be related to primary neoplasm (SUV max = 7.0). Hypermetabolism associated with the left epiglottis demonstrates SUV max = 11.9. Multiple right  level II cervical nodes identified measuring up to 1.2 cm short axis (39/5) with SUV max = 6.9. Incidental CT findings: None. CHEST: No hypermetabolic mediastinal or hilar nodes. No suspicious pulmonary nodules on the CT scan. Incidental CT findings: Coronary artery calcification is evident. Moderate atherosclerotic calcification is noted in the wall of the thoracic aorta. Centrilobular emphysema noted in the lungs bilaterally. ABDOMEN/PELVIS: 1.2 cm short axis aortocaval node in the abdomen (141/5) demonstrates SUV max = 5.0. This lymph node was 0.7 cm short axis on 09/15/2020. No other hypermetabolic lymphadenopathy in the abdomen. No hypermetabolic liver lesion. Incidental CT findings: Tiny layering calcified gallstones evident. Vascular calcification noted in the hilum of each kidney. Small cyst noted lower pole left kidney status post aorto iliac endograft placement. Advanced diverticular disease noted left colon without diverticulitis SKELETON: No worrisome lytic or sclerotic osseous abnormality. Benign appearing left iliac lesion is stable since the study from 09/15/2020 in shows no hypermetabolism. Incidental CT findings: None. IMPRESSION: 1. Asymmetric Hypermetabolism in the left oropharynx and involving the left epiglottis. Imaging features compatible with primary neoplasm. 2. Contralateral (right-sided) hypermetabolic level II cervical lymph nodes compatible with metastatic disease. No hypermetabolic lymphadenopathy in the left neck. 3. No hypermetabolic lymphadenopathy in the chest. 4. 1.2 cm short axis aortocaval lymph node in the upper abdomen is  hypermetabolic. Metastatic disease is a concern. This lymph node has increased from 0.7 cm on a study from 2022. 5. No evidence for hypermetabolic metastatic disease in the pelvis. 6.  Emphysema. (ICD10-J43.9) 7.  Aortic Atherosclerois (ICD10-170.0) Electronically Signed   By: Misty Stanley M.D.   On: 05/08/2022 07:59      IMPRESSION/PLAN:  This is a delightful patient with epiglottic head and neck cancer. He is not keen on surgery.   We reviewed his PET images together.  He knows he will discussed at tumor board tomorrow.  He understands bx of the abdominal node may be pursued but I don't think it should delay his treatment (I've messaged GI providers to see if this could be bx'd by endoscopy). He may need a neck bx (right) as well, though I'm confident the cervical nodes, contralaterally, are cancerous - to be discussed an ENT board.  He understands that given his imaging findings, I believe there is a good likelihood that concurrent chemotherapy and radiation over 7 weeks will be in his best interest.     We discussed the potential risks, benefits, and side effects of radiotherapy. We talked in detail about acute and late effects. We discussed that some of the most bothersome acute effects may be mucositis, dysgeusia, salivary changes, skin irritation, hair loss, dehydration, weight loss and fatigue. We talked about late effects which include but are not necessarily limited to dysphagia, hypothyroidism, nerve injury, vascular injury, spinal cord injury, xerostomia, trismus, neck edema, and potential injury to any of the tissues in the head and neck region. No guarantees of treatment were given. A consent form was signed and placed in the patient's medical record. The patient is enthusiastic about proceeding with treatment. I look forward to participating in the patient's care.    Simulation (treatment planning) will take place after final plan is established by team  We also discussed that the  treatment of head and neck cancer is a multidisciplinary process to maximize treatment outcomes and quality of life. For this reason the following referrals have been or will be made:   Medical oncology to discuss chemotherapy    Nutritionist for nutrition support during  and after treatment. (PEG tube discussed as possibility if he received ChRT concurrently)   Speech language pathology for swallowing and/or speech therapy.   Social work for social support.    Physical therapy due to risk of lymphedema in neck and deconditioning.   Baseline labs including TSH.  I asked the patient today about tobacco use. The patient uses tobacco.  I advised the patient to quit. Services were offered by me today including outpatient counseling and pharmacotherapy. I assessed for the willingness to attempt to quit and provided encouragement and demonstrated willingness to make referrals and/or prescriptions to help the patient attempt to quit. The patient has follow-up with the oncologic team to touch base on their tobacco use and /or cessation efforts.  Over 3 minutes were spent on this issue. Nicotine patches Rx'd. Quit date not yet established.   He states he will also cut back on ETOH, understanding that this, as well as smoking, likely contributed to causing his cancer.   On date of service, in total, I spent 65 minutes on this encounter. Patient was seen in person.  __________________________________________   [name], MD  This document serves as a record of services personally performed by [name], MD. It was created on her behalf by Roney Mans, a trained medical scribe. The creation of this record is based on the scribe's personal observations and the provider's statements to them. This document has been checked and approved by the attending provider.

## 2022-05-08 ENCOUNTER — Encounter: Payer: Self-pay | Admitting: Hematology and Oncology

## 2022-05-08 ENCOUNTER — Inpatient Hospital Stay: Payer: Medicare HMO | Attending: Hematology and Oncology | Admitting: Hematology and Oncology

## 2022-05-08 ENCOUNTER — Encounter: Payer: Self-pay | Admitting: Radiation Oncology

## 2022-05-08 ENCOUNTER — Inpatient Hospital Stay: Payer: Medicare HMO

## 2022-05-08 ENCOUNTER — Ambulatory Visit
Admission: RE | Admit: 2022-05-08 | Discharge: 2022-05-08 | Disposition: A | Discharge status: Home / Self Care | Payer: Medicare HMO | Source: Ambulatory Visit | Attending: Radiation Oncology | Admitting: Radiation Oncology

## 2022-05-08 ENCOUNTER — Other Ambulatory Visit: Payer: Self-pay

## 2022-05-08 VITALS — BP 131/87 | HR 57 | Temp 97.9°F | Resp 18 | Ht 76.0 in | Wt 214.3 lb

## 2022-05-08 VITALS — Temp 97.8°F | Resp 18 | Ht 76.0 in | Wt 213.0 lb

## 2022-05-08 DIAGNOSIS — C321 Malignant neoplasm of supraglottis: Secondary | ICD-10-CM | POA: Insufficient documentation

## 2022-05-08 DIAGNOSIS — Z79899 Other long term (current) drug therapy: Secondary | ICD-10-CM | POA: Diagnosis not present

## 2022-05-08 DIAGNOSIS — I350 Nonrheumatic aortic (valve) stenosis: Secondary | ICD-10-CM | POA: Insufficient documentation

## 2022-05-08 DIAGNOSIS — C320 Malignant neoplasm of glottis: Secondary | ICD-10-CM

## 2022-05-08 DIAGNOSIS — Z8041 Family history of malignant neoplasm of ovary: Secondary | ICD-10-CM | POA: Diagnosis not present

## 2022-05-08 DIAGNOSIS — C770 Secondary and unspecified malignant neoplasm of lymph nodes of head, face and neck: Secondary | ICD-10-CM | POA: Insufficient documentation

## 2022-05-08 DIAGNOSIS — Z803 Family history of malignant neoplasm of breast: Secondary | ICD-10-CM | POA: Insufficient documentation

## 2022-05-08 DIAGNOSIS — J029 Acute pharyngitis, unspecified: Secondary | ICD-10-CM | POA: Insufficient documentation

## 2022-05-08 DIAGNOSIS — F17210 Nicotine dependence, cigarettes, uncomplicated: Secondary | ICD-10-CM | POA: Diagnosis not present

## 2022-05-08 DIAGNOSIS — R590 Localized enlarged lymph nodes: Secondary | ICD-10-CM | POA: Insufficient documentation

## 2022-05-08 DIAGNOSIS — C329 Malignant neoplasm of larynx, unspecified: Secondary | ICD-10-CM

## 2022-05-08 LAB — COMPREHENSIVE METABOLIC PANEL
ALT: 17 U/L (ref 0–44)
AST: 14 U/L — ABNORMAL LOW (ref 15–41)
Albumin: 3.7 g/dL (ref 3.5–5.0)
Alkaline Phosphatase: 39 U/L (ref 38–126)
Anion gap: 3 — ABNORMAL LOW (ref 5–15)
BUN: 12 mg/dL (ref 8–23)
CO2: 28 mmol/L (ref 22–32)
Calcium: 9 mg/dL (ref 8.9–10.3)
Chloride: 107 mmol/L (ref 98–111)
Creatinine, Ser: 0.86 mg/dL (ref 0.61–1.24)
GFR, Estimated: >60 mL/min (ref >60)
Glucose, Bld: 105 mg/dL — ABNORMAL HIGH (ref 70–99)
Potassium: 4.3 mmol/L (ref 3.5–5.1)
Sodium: 138 mmol/L (ref 135–145)
Total Bilirubin: 0.4 mg/dL (ref 0.3–1.2)
Total Protein: 6.1 g/dL — ABNORMAL LOW (ref 6.5–8.1)

## 2022-05-08 LAB — CBC WITH DIFFERENTIAL/PLATELET
Abs Immature Granulocytes: 0.02 10*3/uL (ref 0.00–0.07)
Basophils Absolute: 0 10*3/uL (ref 0.0–0.1)
Basophils Relative: 1 %
Eosinophils Absolute: 0.1 10*3/uL (ref 0.0–0.5)
Eosinophils Relative: 2 %
HCT: 45.8 % (ref 39.0–52.0)
Hemoglobin: 15.7 g/dL (ref 13.0–17.0)
Immature Granulocytes: 0 %
Lymphocytes Relative: 27 %
Lymphs Abs: 1.7 10*3/uL (ref 0.7–4.0)
MCH: 32.7 pg (ref 26.0–34.0)
MCHC: 34.3 g/dL (ref 30.0–36.0)
MCV: 95.4 fL (ref 80.0–100.0)
Monocytes Absolute: 0.7 10*3/uL (ref 0.1–1.0)
Monocytes Relative: 10 %
Neutro Abs: 3.7 10*3/uL (ref 1.7–7.7)
Neutrophils Relative %: 60 %
Platelets: 203 10*3/uL (ref 150–400)
RBC: 4.8 MIL/uL (ref 4.22–5.81)
RDW: 12.6 % (ref 11.5–15.5)
WBC: 6.2 10*3/uL (ref 4.0–10.5)
nRBC: 0 % (ref 0.0–0.2)

## 2022-05-08 LAB — TSH: TSH: 0.798 u[IU]/mL (ref 0.350–4.500)

## 2022-05-08 MED ORDER — NICOTINE 14 MG/24HR TD PT24: 14.0000 mg | MEDICATED_PATCH | Freq: Every day | TRANSDERMAL | 0 refills | Status: DC

## 2022-05-08 MED ORDER — NICOTINE 21 MG/24HR TD PT24: 21.0000 mg | MEDICATED_PATCH | Freq: Every day | TRANSDERMAL | 2 refills | Status: DC

## 2022-05-08 MED ORDER — OXYMETAZOLINE HCL 0.05 % NA SOLN
2.0000 | Freq: Once | NASAL | Status: AC
  Administered 2022-05-08: 2 via NASAL
  Filled 2022-05-08: qty 30

## 2022-05-08 MED ORDER — NICOTINE 7 MG/24HR TD PT24: 7.0000 mg | MEDICATED_PATCH | Freq: Every day | TRANSDERMAL | 0 refills | Status: DC

## 2022-05-08 NOTE — Progress Notes (Signed)
[name] CONSULT NOTE  Patient Care Team: [name], MD as PCP - General [name], MD as PCP - Cardiology (Cardiology) Viona Gilmore, 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  He also saw Dr. Isidore Moos today for recommendations.  He complains of sore throat which radiates to his left ear, otherwise denies any new complaints.  At baseline he appears to have severe vascular issues with multiple procedures, moderate aortic stenosis (clinically asymptomatic) however he still stays active.  He is a retired Chartered certified accountant.  Physical examination today no palpable masses in the neck or lymphadenopathy.  We have reviewed PET/CT imaging and discussed the possible recommendations.  If this aortocaval lymph node is not considered secondary to supraglottic cancer then he may be considered for chemotherapy and radiation with weekly cisplatin.  We have discussed this in detail.  I have also discussed about adverse effects of cisplatin including but not limited to fatigue, nausea, increased risk of infections, neuropathy, ototoxicity and nephrotoxicity.  He understands that some of the side effects can be permanent.  Then, if he is not a candidate for concurrent chemoradiation given possibility of metastatic disease, we may start with systemic therapy alone for couple cycles and then reimage and if he has no other progression, we can consider concurrent chemoradiation to the neck as well as probably palliative radiation to the aortocaval lymph node.  We  will inform him of the treatment recommendations after tumor board tomorrow.  All his questions were answered to the best my knowledge.  Thank you for consulting [place] the care of this patient.  Please not hesitate contact us with any additional questions or concerns  Orders Placed This Encounter  Procedures   CBC with Differential/Platelet    Standing Status:   Standing    Number of Occurrences:   22    Standing Expiration Date:   05/09/2023   Comprehensive metabolic panel    Standing Status:   Standing    Number of Occurrences:   33    Standing Expiration Date:   05/09/2023     HISTORY OF PRESENTING ILLNESS:  Cory [name] 76 y.o. male is here because of cancer of supraglottis  This is a 76 year old male patient who initially presented to ENT with chronic left-sided sore throat for almost a year.  Initial evaluation did not show any possible causes for sore throat.  He was recommended PPI twice daily but went back to ENT with persistent sore throat he once again had a repeat fiberoptic exam which was stable.  CT of the neck was however ordered.  CT neck done on November 08, 2021 did show subcentimeter focus of mucosal hyperenhancement along the posterior aspect of the uvula [place] laryngoscopy with direct visualization recommended to exclude a mucosal lesion at the site.  Additionally few tiny cystic-appearing foci along the left glossotonsillar sulcus.  Direct visualization recommended.  Nonspecific mildly enlarged right level 2 lymph node.  Multiple small calcific foci in the left palatine tonsil. Repeat fiberoptic exam was unremarkable hence Dr. 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  Tongue biopsy showed benign squamous mucosa with low to intermediate grade dysplasia.  PET/CT showed asymmetric hypermetabolism  in the left oropharynx and involving the left epiglottis.  Contralateral hypermetabolic level 2 cervical lymph nodes compatible with metastatic disease.  No hypermetabolic adenopathy in the left neck or in the chest.  1.2 cm short axis aortocaval lymph node in the upper abdomen is hypermetabolic metastatic disease is a concern.  This lymph node has increased from 0.7 cm on the study from 2022.  No other metastatic disease.  He arrived to the appointment today by himself.   MEDICAL HISTORY:  Past Medical History:  Diagnosis Date   Aortic stenosis    a. Moderate by echo 05/2020.   Arthritis    right hip   Cancer ([place])    skin cancer nose, right left, upper left basel cell   Dysrhythmia    a-fib   PAF (paroxysmal atrial fibrillation) (HCC)    Peripheral arterial disease (HCC)    Tobacco abuse     SURGICAL HISTORY: Past Surgical History:  Procedure Laterality Date   ABDOMINAL AORTIC ENDOVASCULAR STENT GRAFT N/A 08/29/2020   Procedure: ABDOMINAL AORTIC ENDOVASCULAR STENT GRAFT;  Surgeon: Elam Dutch, MD;  Location: [place];  Service: Vascular;  Laterality: N/A;   ABDOMINAL AORTOGRAM W/LOWER EXTREMITY Bilateral 06/24/2020   Procedure: ABDOMINAL AORTOGRAM W/LOWER EXTREMITY;  Surgeon: Elam Dutch, MD;  Location: [place] [facility];  Service: Cardiovascular;  Laterality: Bilateral;   BACK SURGERY     2005   CERVICAL SPINE SURGERY     DIRECT LARYNGOSCOPY Bilateral 04/18/2022   Procedure: DIRECT LARYNGOSCOPY WITH BIOPSIES;  Surgeon: Melida Quitter, MD;  Location: [place];  Service: ENT;  Laterality: Bilateral;   EMBOLECTOMY Right 08/29/2020   Procedure: POPLITEAL AND TIBIAL EMBOLECTOMY;  Surgeon: Elam Dutch, MD;  Location: [place];  Service: Vascular;  Laterality: Right;   EMBOLIZATION Right 06/24/2020   Procedure: EMBOLIZATION;  Surgeon: Elam Dutch, MD;  Location: [place] [facility];  Service: Cardiovascular;  Laterality: Right;   ENDARTERECTOMY FEMORAL Right 08/29/2020    Procedure: ENDARTERECTOMY FEMORAL;  Surgeon: Elam Dutch, MD;  Location: [place];  Service: Vascular;  Laterality: Right;   EYE SURGERY     FEMORAL ARTERY EXPLORATION Right 06/25/2020   Procedure: RIGHT GROIN EXPLORATION Evacuation of Hematoma  WITH REPAIR OF RIGHT Common FEMORAL ARTERY.;  Surgeon: Angelia Mould, MD;  Location: [place];  Service: Vascular;  Laterality: Right;   FEMORAL-POPLITEAL BYPASS GRAFT Left 11/22/2020   Procedure: LEFT ABOVE KNEE-BELOW KNEE BYPASS TIBIAL PERITONEAL TRUNK REVERSE IPSILATERAL GREATER SAPHENOUS VEIN;  Surgeon: Elam Dutch, MD;  Location: [place];  Service: Vascular;  Laterality: Left;   LEG SURGERY     Trauma   LOWER EXTREMITY ANGIOGRAPHY Left 11/17/2020   Procedure: Lower Extremity Angiography;  Surgeon: Marty Heck, MD;  Location: [place] [facility];  Service: Cardiovascular;  Laterality: Left;   PATCH ANGIOPLASTY Right 08/29/2020   Procedure: PATCH ANGIOPLASTY RIGHT BELOW KNEE POPLITEAL ARTERY AND RIGHT COMMON FEMORAL ARTERY;  Surgeon: Elam Dutch, MD;  Location: [place];  Service: Vascular;  Laterality: Right;   RETINAL DETACHMENT SURGERY     RIGID ESOPHAGOSCOPY  04/18/2022   Procedure: RIGID ESOPHAGOSCOPY;  Surgeon: Melida Quitter, MD;  Location: [place];  Service: ENT;;   TONSILLECTOMY  04/18/2022   Procedure: LEFT TONSILLECTOMY;  Surgeon: Melida Quitter, MD;  Location: [facility];  Service: ENT;;   ULTRASOUND GUIDANCE FOR VASCULAR ACCESS Bilateral 08/29/2020   Procedure: ULTRASOUND GUIDANCE FOR VASCULAR ACCESS;  Surgeon: Elam Dutch, MD;  Location: [facility];  Service: Vascular;  Laterality: Bilateral;    SOCIAL HISTORY: Social History   Socioeconomic History   Marital status: Married    Spouse name: Not on file   Number of children: 5   Years of education: Not on file   Highest education level: Master's degree (e.g., MA, MS, MEng, MEd, MSW, MBA)  Occupational History   Not on file  Tobacco Use   Smoking status: Every Day     Packs/day: 0.50    Years: 50.00    Total pack years: 25.00    Types: Cigarettes    Passive exposure: Never   Smokeless tobacco: Never  Vaping Use   Vaping Use: Never used  Substance and Sexual Activity   Alcohol use: Yes    Alcohol/week: 0.0 standard drinks of alcohol    Comment: 4 to 5 times a week; beer, wine and liquor    Drug use: No   Sexual activity: Not Currently  Other Topics Concern   Not on file  Social History Narrative   Scores reading tests.  Lives with wife.     Social Determinants of Health   Financial Resource Strain: Low Risk  (01/08/2022)   Overall Financial Resource Strain (CARDIA)    Difficulty of Paying Living Expenses: Not hard at all  Food Insecurity: No Food Insecurity (01/08/2022)   Hunger Vital Sign    Worried About Running Out of Food in the Last Year: Never true    Ran Out of Food in the Last Year: Never true  Transportation Needs: No Transportation Needs (01/08/2022)   PRAPARE - Hydrologist (Medical): No    Lack of Transportation (Non-Medical): No  Physical Activity: Insufficiently Active (01/08/2022)   Exercise Vital Sign    Days of Exercise per Week: 3 days    Minutes of Exercise per Session: 30 min  Stress: No Stress Concern Present (01/08/2022)   [place]    Feeling of Stress : Not at all  Social Connections: Moderately Isolated (01/08/2022)   Social Connection and Isolation Panel [NHANES]    Frequency of Communication with Friends and Family: More than three times a week    Frequency of Social Gatherings with Friends and Family: Three times a week    Attends Religious Services: Never    Active Member of Clubs or Organizations: No    Attends Archivist Meetings: Never    Marital Status: Married  Human resources officer Violence: Not At Risk (06/07/2021)   Humiliation, Afraid, Rape, and Kick questionnaire    Fear of Current or Ex-Partner: No     Emotionally Abused: No    Physically Abused: No    Sexually Abused: No    FAMILY HISTORY: Family History  Problem Relation Age of Onset   Ovarian cancer Mother    Alcoholism Father    Breast cancer Sister    Ovarian cancer Sister     ALLERGIES:  is allergic to zithromax [azithromycin].  MEDICATIONS:  Current Outpatient Medications  Medication Sig Dispense Refill   acetaminophen (TYLENOL) 500 MG tablet Take 500 mg by mouth every 6 (six) hours as needed (for pain.).     cetirizine (ZYRTEC) 10 MG tablet Take 1 tablet (10 mg total) by mouth daily. 30 tablet 11  cholecalciferol (VITAMIN D) 25 MCG (1000 UNIT) tablet Take 1,000 Units by mouth 2 (two) times daily.     diltiazem (TIADYLT ER) 120 MG 24 hr capsule TAKE 1 CAPSULE EVERY DAY (SCHEDULE OFFICE VISIT FOR FUTURE REFILLS) 90 capsule 1   HYDROcodone-acetaminophen (HYCET) 7.5-325 mg/15 ml solution Take 15 mLs by mouth every 6 (six) hours as needed for moderate pain. 250 mL 0   metoprolol tartrate (LOPRESSOR) 25 MG tablet Take 1 tablet (25 mg total) by mouth 2 (two) times daily as needed. 30 tablet 1   nicotine (NICODERM CQ - DOSED IN MG/24 HOURS) 14 mg/24hr patch Place 1 patch (14 mg total) onto the skin daily. Apply 21 mg patch daily x 6 wk, then '14mg'$  patch daily x 2 wk, then 7 mg patch daily x 2 wk 14 patch 0   nicotine (NICODERM CQ - DOSED IN MG/24 HOURS) 21 mg/24hr patch Place 1 patch (21 mg total) onto the skin daily. Apply 21 mg patch daily x 6 wk, then '14mg'$  patch daily x 2 wk, then 7 mg patch daily x 2 wk 14 patch 2   nicotine (NICODERM CQ - DOSED IN MG/24 HR) 7 mg/24hr patch Place 1 patch (7 mg total) onto the skin daily. Apply 21 mg patch daily x 6 wk, then '14mg'$  patch daily x 2 wk, then 7 mg patch daily x 2 wk 14 patch 0   rivaroxaban (XARELTO) 20 MG TABS tablet TAKE 1 TABLET BY MOUTH ONCE DAILY WITH SUPPER 90 tablet 1   rosuvastatin (CRESTOR) 10 MG tablet Take 1 tablet (10 mg total) by mouth daily. (Patient taking differently:  Take 10 mg by mouth every evening.) 90 tablet 3   No current facility-administered medications for this visit.     PHYSICAL EXAMINATION: ECOG PERFORMANCE STATUS: 1 - Symptomatic but completely ambulatory  Vitals:   05/08/22 1034  BP: 131/87  Pulse: (!) 57  Resp: 18  Temp: 97.9 F (36.6 C)  SpO2: 100%   Filed Weights   05/08/22 1034  Weight: 214 lb 4.8 oz (97.2 kg)    GENERAL:alert, no distress and comfortable SKIN: skin color, texture, turgor are normal, no rashes or significant lesions EYES: normal, conjunctiva are pink and non-injected, sclera clear OROPHARYNX:no exudate, no erythema and lips, buccal mucosa, and tongue normal  NECK: supple, thyroid normal size, non-tender, without nodularity LYMPH:  no palpable lymphadenopathy in the cervical, axillary or inguinal LUNGS: clear to auscultation and percussion with normal breathing effort HEART: regular rate & rhythm and no murmurs and no lower extremity edema ABDOMEN:abdomen soft, non-tender and normal bowel sounds Musculoskeletal:no cyanosis of digits and no clubbing  PSYCH: alert & oriented x 3 with fluent speech NEURO: no focal motor/sensory deficits  LABORATORY DATA:  I have reviewed the data as listed Lab Results  Component Value Date   WBC 6.0 04/04/2022   HGB 15.0 04/04/2022   HCT 45.4 04/04/2022   MCV 99.6 04/04/2022   PLT 176 04/04/2022     Chemistry      Component Value Date/Time   NA 137 04/04/2022 0720   NA 141 05/16/2018 1425   K 4.1 04/04/2022 0720   CL 103 04/04/2022 0720   CO2 23 04/04/2022 0720   BUN 13 04/04/2022 0720   BUN 13 05/16/2018 1425   CREATININE 0.94 04/04/2022 0720      Component Value Date/Time   CALCIUM 8.7 (L) 04/04/2022 0720   ALKPHOS 43 01/09/2022 0934   AST 16 01/09/2022 0934   ALT  19 01/09/2022 0934   BILITOT 0.7 01/09/2022 0934       RADIOGRAPHIC STUDIES: I have personally reviewed the radiological images as listed and agreed with the findings in the report. NM  PET Image Initial (PI) Skull Base To Thigh  Result Date: 05/08/2022 CLINICAL DATA:  Subsequent treatment strategy for head and neck cancer. EXAM: NUCLEAR MEDICINE PET SKULL BASE TO THIGH TECHNIQUE: 10.9 mCi F-18 FDG was injected intravenously. Full-ring PET imaging was performed from the skull base to thigh after the radiotracer. CT data was obtained and used for attenuation correction and anatomic localization. Fasting blood glucose: 108 mg/dl COMPARISON:  CT a chest 05/29/2021. Neck CT 11/08/2021. CTA abdomen and pelvis 09/15/2020. FINDINGS: Mediastinal blood pool activity: SUV max 2.4 Liver activity: SUV max NA NECK: Asymmetric Hypermetabolism in the left oropharynx may be related to primary neoplasm (SUV max = 7.0). Hypermetabolism associated with the left epiglottis demonstrates SUV max = 11.9. Multiple right level II cervical nodes identified measuring up to 1.2 cm short axis (39/5) with SUV max = 6.9. Incidental CT findings: None. CHEST: No hypermetabolic mediastinal or hilar nodes. No suspicious pulmonary nodules on the CT scan. Incidental CT findings: Coronary artery calcification is evident. Moderate atherosclerotic calcification is noted in the wall of the thoracic aorta. Centrilobular emphysema noted in the lungs bilaterally. ABDOMEN/PELVIS: 1.2 cm short axis aortocaval node in the abdomen (141/5) demonstrates SUV max = 5.0. This lymph node was 0.7 cm short axis on 09/15/2020. No other hypermetabolic lymphadenopathy in the abdomen. No hypermetabolic liver lesion. Incidental CT findings: Tiny layering calcified gallstones evident. Vascular calcification noted in the hilum of each kidney. Small cyst noted lower pole left kidney status post aorto iliac endograft placement. Advanced diverticular disease noted left colon without diverticulitis SKELETON: No worrisome lytic or sclerotic osseous abnormality. Benign appearing left iliac lesion is stable since the study from 09/15/2020 in shows no  hypermetabolism. Incidental CT findings: None. IMPRESSION: 1. Asymmetric Hypermetabolism in the left oropharynx and involving the left epiglottis. Imaging features compatible with primary neoplasm. 2. Contralateral (right-sided) hypermetabolic level II cervical lymph nodes compatible with metastatic disease. No hypermetabolic lymphadenopathy in the left neck. 3. No hypermetabolic lymphadenopathy in the chest. 4. 1.2 cm short axis aortocaval lymph node in the upper abdomen is hypermetabolic. Metastatic disease is a concern. This lymph node has increased from 0.7 cm on a study from 2022. 5. No evidence for hypermetabolic metastatic disease in the pelvis. 6.  Emphysema. (ICD10-J43.9) 7.  Aortic Atherosclerois (ICD10-170.0) Electronically Signed   By: Misty Stanley M.D.   On: 05/08/2022 07:59    All questions were answered. The patient knows to call the clinic with any problems, questions or concerns. I spent 60 minutes in the care of this patient including H and P, review of records, counseling and coordination of care.     Benay Pike, MD 05/08/2022 11:07 AM

## 2022-05-08 NOTE — Assessment & Plan Note (Signed)
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  He also saw Dr. Isidore Moos today for recommendations.  He complains of sore throat which radiates to his left ear, otherwise denies any new complaints.  At baseline he appears to have severe vascular issues with multiple procedures, moderate aortic stenosis (clinically asymptomatic) however he still stays active.  He is a retired Chartered certified accountant.  Physical examination today no palpable masses in the neck or lymphadenopathy.  We have reviewed PET/CT imaging and discussed the possible recommendations.  If this aortocaval lymph node is not considered secondary to supraglottic cancer then he may be considered for chemotherapy and radiation with weekly cisplatin.  We have discussed this in detail.  I have also discussed about adverse effects of cisplatin including but not limited to fatigue, nausea, increased risk of infections, neuropathy, ototoxicity and nephrotoxicity.  He understands that some of the side effects can be permanent.  Then, if he is not a candidate for concurrent chemoradiation given possibility of metastatic disease, we may start with systemic therapy alone for couple cycles and then reimage and if he has no other progression, we can consider concurrent chemoradiation to the neck as well as probably palliative radiation to the aortocaval lymph node.  We will inform him of the treatment recommendations after tumor board tomorrow.  All his questions were answered to the best my knowledge.  Thank you for consulting [place] the care of this patient.  Please not hesitate contact us with any additional questions or concerns

## 2022-05-08 NOTE — Progress Notes (Signed)
Oncology Nurse Navigator Documentation   Met with patient during initial consult with Dr. Isidore Moos and Dr. Chryl Heck today.  Further introduced myself as his Navigator, explained my role as a member of the Care Team. Provided New Patient Information packet: Contact information for physician, this navigator, other members of the Care Team Advance Directive information ([place] blue pamphlet with LCSW insert); provided [facility] AD booklet at his request,  Fall Prevention Patient [place] Information sheet Symptom Management Clinic information [facility] campus map with highlight of [place] SLP Information sheet Provided and discussed educational handouts for PEG and PAC. Assisted with post-consult appt scheduling. I toured him to CT simulation and the [facility] treatment area, explained procedures for lobby registration, arrival to Radiation Waiting, and arrival to treatment area.  He voiced understanding.     He verbalized understanding of information provided. He knows that I will call him with updates regarding his plan of care after ENT conference tomorrow morning.  I encouraged him to call with questions/concerns moving forward.  [name], RN, BSN, OCN Head & Neck Oncology Nurse [place] at [place] [phone]

## 2022-05-09 ENCOUNTER — Other Ambulatory Visit: Payer: Self-pay

## 2022-05-09 ENCOUNTER — Telehealth: Payer: Self-pay

## 2022-05-09 DIAGNOSIS — C760 Malignant neoplasm of head, face and neck: Secondary | ICD-10-CM

## 2022-05-09 DIAGNOSIS — R599 Enlarged lymph nodes, unspecified: Secondary | ICD-10-CM

## 2022-05-09 NOTE — Telephone Encounter (Signed)
05/21/22 at 145 pm at [facility] with GM for EGD EUS   Left message on machine to call back   Will send letter to [facility] for [place] hold

## 2022-05-09 NOTE — Telephone Encounter (Signed)
[facility] Pre-operative Risk Assessment     Request for surgical clearance:     Endoscopy Procedure  What type of surgery is being performed?     EUS   When is this surgery scheduled?     05/21/22  What type of clearance is required ?   Pharmacy  Are there any medications that need to be held prior to surgery and how long? Xarelto   Practice name and name of physician performing surgery?      [facility]  What is your office phone and fax number?      Phone- [phone]  [phone]  Anesthesia type (None, local, MAC, general) ?       MAC

## 2022-05-09 NOTE — Telephone Encounter (Signed)
Patty, You should be able to sit that patient needed currently.  I had the Endo team adjust the prior colonoscopy to a 75-minute case rather than 90-minute case.  There now is a 15-minute turnaround time between the colonoscopy and should have a hour-long EUS slot.  Please let me know if there are any other issues. Thanks. GM

## 2022-05-09 NOTE — Telephone Encounter (Signed)
10/16 is full there is not enough time to add anymore cases.

## 2022-05-09 NOTE — Telephone Encounter (Signed)
Request routed to Pharm D for guidance on holding Xarelto. Patient was cleared for direct laryngoscopy with biopsy by [name], PA on 04/12/22. She recommended that he get an echocardiogram following surgery which is scheduled for 05/15/22. Would recommend a regular phone call just to assess current status pending no major concerns on echo.  Emmaline Life, NP-C  05/09/2022, 11:59 AM [address] Office [phone] Fax [phone]

## 2022-05-09 NOTE — Telephone Encounter (Signed)
Patty, Thanks for getting the slot scheduled. Hopefully the patient will be available for this because otherwise we do not have any other access or availability for EUS in that short time interval that was asked. Thanks for your help as always. GM  FYI team.

## 2022-05-09 NOTE — Telephone Encounter (Signed)
-----   Message from Irving Copas., MD sent at 05/09/2022  9:44 AM EDT ----- Things are quite tight currently. But, we will try to squeeze him in.  Adriana Quinby, Please try to get this patient scheduled for EGD/EUS in the next couple of weeks (October 16 should still have available time). Thanks. GM ----- Message ----- From: Ernst Spell, RN Sent: 05/09/2022   8:29 AM EDT To: [name], MD; Jerene Bears, MD; #  Good morning- We discussed this patient in our ENT Tumor Board this morning. The consensus of the group was that they would like the  EUS to be completed within the next 2 weeks because the results would change the treatment plan regarding his head and neck cancer.   Dr. Rush Landmark- Do you believe that an EUS could be completed in the next 2 weeks?   Thanks! [name] (head and neck navigator) ----- Message ----- From: [name], MD Sent: 05/08/2022   2:34 PM EDT To: Ernst Spell, RN; Jerene Bears, MD; #  Please proceed! Thanks! ----- Message ----- From: Irving Copas., MD Sent: 05/08/2022   1:39 PM EDT To: [name], MD; Ernst Spell, RN; #  Just give me the go and my team can work on arranging the EUS. Should not impact any of your ability to begin treatments as you would. GM ----- Message ----- From: [name], MD Sent: 05/08/2022   1:07 PM EDT To: Ernst Spell, RN; Jerene Bears, MD; #  Patient is aware that I got in touch w/ GI and that he may get a call.  Dr. Rush Landmark,  can you arrange consult/ bx as soon as it's realistic?  Thanks, SS  ----- Message ----- From: Benay Pike, MD Sent: 05/08/2022   1:03 PM EDT To: [name], MD; Ernst Spell, RN; #  Dr Rush Landmark,  It does change the plan for [place]. We will talk to him and send him your way if he is in agreement.  Thanks ----- Message ----- From: [name], MD Sent: 05/08/2022   9:13 AM EDT To: Ernst Spell, RN; Jerene Bears, MD;  #  GI team,  Hello! I am about to see this pt w/ H+N cancer.  He has an incidental 1.2 cm short axis aortocaval lymph node in the upper abdomen that looks suspicious on PET. And with interval growth. I will not necessarily let this delay our treatment planning to cure his head neck cancer (would be unusual for supraglottic cancer to metastasize there) but I am wondering if you think this would be accessible for biopsy by endoscopy in the near future?  Please let me know your thoughts.  Thanks, Judson Roch

## 2022-05-10 ENCOUNTER — Encounter (HOSPITAL_COMMUNITY): Payer: Self-pay | Admitting: Gastroenterology

## 2022-05-10 ENCOUNTER — Telehealth: Payer: Self-pay | Admitting: *Deleted

## 2022-05-10 NOTE — Telephone Encounter (Signed)
Pt agreeable to plan of care for tele pre op appt 05/17/22 @ 9:20. Med rec and consent are done.     Patient Consent for Virtual Visit        Cory [name] has provided verbal consent on 05/10/2022 for a virtual visit (video or telephone).   CONSENT FOR VIRTUAL VISIT FOR:  Cory [name]  By participating in this virtual visit I agree to the following:  I hereby voluntarily request, consent and authorize [facility] and its employed or contracted physicians, physician assistants, nurse practitioners or other licensed health care professionals (the Practitioner), to provide me with telemedicine health care services (the "Services") as deemed necessary by the treating Practitioner. I acknowledge and consent to receive the Services by the Practitioner via telemedicine. I understand that the telemedicine visit will involve communicating with the Practitioner through live audiovisual communication technology and the disclosure of certain medical information by electronic transmission. I acknowledge that I have been given the opportunity to request an in-person assessment or other available alternative prior to the telemedicine visit and am voluntarily participating in the telemedicine visit.  I understand that I have the right to withhold or withdraw my consent to the use of telemedicine in the course of my care at any time, without affecting my right to future care or treatment, and that the Practitioner or I may terminate the telemedicine visit at any time. I understand that I have the right to inspect all information obtained and/or recorded in the course of the telemedicine visit and may receive copies of available information for a reasonable fee.  I understand that some of the potential risks of receiving the Services via telemedicine include:  Delay or interruption in medical evaluation due to technological equipment failure or disruption; Information transmitted may not be sufficient  (e.g. poor resolution of images) to allow for appropriate medical decision making by the Practitioner; and/or  In rare instances, security protocols could fail, causing a breach of personal health information.  Furthermore, I acknowledge that it is my responsibility to provide information about my medical history, conditions and care that is complete and accurate to the best of my ability. I acknowledge that Practitioner's advice, recommendations, and/or decision may be based on factors not within their control, such as incomplete or inaccurate data provided by me or distortions of diagnostic images or specimens that may result from electronic transmissions. I understand that the practice of medicine is not an exact science and that Practitioner makes no warranties or guarantees regarding treatment outcomes. I acknowledge that a copy of this consent can be made available to me via my patient portal ([facility]), or I can request a printed copy by calling the office of [facility].    I understand that my insurance will be billed for this visit.   I have read or had this consent read to me. I understand the contents of this consent, which adequately explains the benefits and risks of the Services being provided via telemedicine.  I have been provided ample opportunity to ask questions regarding this consent and the Services and have had my questions answered to my satisfaction. I give my informed consent for the services to be provided through the use of telemedicine in my medical care

## 2022-05-10 NOTE — Telephone Encounter (Signed)
   Name: Cory [name]  DOB: [birth year]  MRN: [id number]  Primary Cardiologist: [name], MD   Preoperative team, please contact this patient and set up a phone call appointment for further preoperative risk assessment. Please obtain consent and complete medication review. Thank you for your help.  I confirm that guidance regarding antiplatelet and oral anticoagulation therapy has been completed and, if necessary, noted below.   Deberah Pelton, NP 05/10/2022, 10:01 AM [place]

## 2022-05-10 NOTE — Telephone Encounter (Signed)
EUS scheduled, pt instructed and medications reviewed.  Patient instructions mailed to home and sent to My Chart.  Patient to call with any questions or concerns.   The pt is aware to stop xarelto 2 days prior to his upcoming procedure appt.

## 2022-05-10 NOTE — Telephone Encounter (Signed)
Patient with diagnosis of A Fib on Xarelto for anticoagulation.    Procedure: EUS  Date of procedure: 05/21/22   CHA2DS2-VASc Score = 4  This indicates a 4.8% annual risk of stroke. The patient's score is based upon: CHF History: 0 HTN History: 1 Diabetes History: 0 Stroke History: 0 Vascular Disease History: 1 Age Score: 2 Gender Score: 0     CrCl 97 ml/min Platelet count 203K   Per office protocol, patient can hold Xarelto for 2 days prior to procedure.    **This guidance is not considered finalized until pre-operative APP has relayed final recommendations.**

## 2022-05-10 NOTE — Telephone Encounter (Signed)
Pt agreeable to plan of care for tele pre op appt 05/17/22 @ 9:20. Med rec and consent are done.

## 2022-05-11 ENCOUNTER — Encounter (HOSPITAL_COMMUNITY): Payer: Self-pay | Admitting: Gastroenterology

## 2022-05-15 ENCOUNTER — Encounter (HOSPITAL_COMMUNITY): Payer: Self-pay | Admitting: Gastroenterology

## 2022-05-15 ENCOUNTER — Ambulatory Visit (HOSPITAL_COMMUNITY): Payer: Medicare HMO | Attending: Cardiology

## 2022-05-15 DIAGNOSIS — I350 Nonrheumatic aortic (valve) stenosis: Secondary | ICD-10-CM | POA: Insufficient documentation

## 2022-05-15 LAB — ECHOCARDIOGRAM COMPLETE
AR max vel: 2.69 cm2
AV Area VTI: 2.93 cm2
AV Area mean vel: 2.84 cm2
AV Mean grad: 22.5 mmHg
AV Peak grad: 44.6 mmHg
Ao pk vel: 3.34 m/s
Area-P 1/2: 2.19 cm2
P 1/2 time: 489 msec
S' Lateral: 2.5 cm

## 2022-05-17 ENCOUNTER — Ambulatory Visit: Payer: Medicare HMO | Attending: Cardiology | Admitting: Physician Assistant

## 2022-05-17 ENCOUNTER — Other Ambulatory Visit: Payer: Self-pay

## 2022-05-17 DIAGNOSIS — Z01810 Encounter for preprocedural cardiovascular examination: Secondary | ICD-10-CM | POA: Diagnosis not present

## 2022-05-17 DIAGNOSIS — C321 Malignant neoplasm of supraglottis: Secondary | ICD-10-CM

## 2022-05-17 NOTE — Progress Notes (Addendum)
Virtual Visit via Telephone Note   Because of Jake Fuhrmann Donegan's co-morbid illnesses, he is at least at moderate risk for complications without adequate follow up.  This format is felt to be most appropriate for this patient at this time.  The patient did not have access to video technology/had technical difficulties with video requiring transitioning to audio format only (telephone).  All issues noted in this document were discussed and addressed.  No physical exam could be performed with this format.  Please refer to the patient's chart for his consent to telehealth for [facility]  Evaluation Performed:  Preoperative cardiovascular risk assessment _____________   Date:  05/17/2022   Patient ID:  Cory [name], DOB [birth year], MRN [id number] Patient Location:  Home Provider location:   Office  Primary Care Provider:  [name], MD Primary Cardiologist:  [name], MD  Chief Complaint / Patient Profile   76 y.o. y/o male with a h/o paroxysmal atrial fibrillation, tobacco use, aortic stenosis and PAD with abdominal aortic endovascular stent graft with Dr. Oneida Alar in January 2022 as well as extensive work-up to lower extremities with Dr. Oneida Alar who is pending EGD and presents today for telephonic preoperative cardiovascular risk assessment.  Past Medical History    Past Medical History:  Diagnosis Date   Aortic stenosis    a. Moderate by echo 05/2020.   Arthritis    right hip   Cancer ([place])    skin cancer nose, right left, upper left basel cell   Dysrhythmia    a-fib   PAF (paroxysmal atrial fibrillation) (HCC)    Peripheral arterial disease (HCC)    Tobacco abuse    Past Surgical History:  Procedure Laterality Date   ABDOMINAL AORTIC ENDOVASCULAR STENT GRAFT N/A 08/29/2020   Procedure: ABDOMINAL AORTIC ENDOVASCULAR STENT GRAFT;  Surgeon: Elam Dutch, MD;  Location: [place];  Service: Vascular;  Laterality: N/A;   ABDOMINAL AORTOGRAM W/LOWER  EXTREMITY Bilateral 06/24/2020   Procedure: ABDOMINAL AORTOGRAM W/LOWER EXTREMITY;  Surgeon: Elam Dutch, MD;  Location: [place] [place] [facility];  Service: Cardiovascular;  Laterality: Bilateral;   BACK SURGERY     2005   CERVICAL SPINE SURGERY     DIRECT LARYNGOSCOPY Bilateral 04/18/2022   Procedure: DIRECT LARYNGOSCOPY WITH BIOPSIES;  Surgeon: Melida Quitter, MD;  Location: [place];  Service: ENT;  Laterality: Bilateral;   EMBOLECTOMY Right 08/29/2020   Procedure: POPLITEAL AND TIBIAL EMBOLECTOMY;  Surgeon: Elam Dutch, MD;  Location: [facility];  Service: Vascular;  Laterality: Right;   EMBOLIZATION Right 06/24/2020   Procedure: EMBOLIZATION;  Surgeon: Elam Dutch, MD;  Location: [place];  Service: Cardiovascular;  Laterality: Right;   ENDARTERECTOMY FEMORAL Right 08/29/2020   Procedure: ENDARTERECTOMY FEMORAL;  Surgeon: Elam Dutch, MD;  Location: [facility];  Service: Vascular;  Laterality: Right;   EYE SURGERY     FEMORAL ARTERY EXPLORATION Right 06/25/2020   Procedure: RIGHT GROIN EXPLORATION Evacuation of Hematoma  WITH REPAIR OF RIGHT Common FEMORAL ARTERY.;  Surgeon: Angelia Mould, MD;  Location: [place];  Service: Vascular;  Laterality: Right;   FEMORAL-POPLITEAL BYPASS GRAFT Left 11/22/2020   Procedure: LEFT ABOVE KNEE-BELOW KNEE BYPASS TIBIAL PERITONEAL TRUNK REVERSE IPSILATERAL GREATER SAPHENOUS VEIN;  Surgeon: Elam Dutch, MD;  Location: [place];  Service: Vascular;  Laterality: Left;   LEG SURGERY     Trauma   LOWER EXTREMITY ANGIOGRAPHY Left 11/17/2020   Procedure: Lower Extremity Angiography;  Surgeon: Monica Martinez  J, MD;  Location: [place] [facility];  Service: Cardiovascular;  Laterality: Left;   PATCH ANGIOPLASTY Right 08/29/2020   Procedure: PATCH ANGIOPLASTY RIGHT BELOW KNEE POPLITEAL ARTERY AND RIGHT COMMON FEMORAL ARTERY;  Surgeon: Elam Dutch, MD;  Location: [facility];  Service: Vascular;  Laterality: Right;   RETINAL DETACHMENT SURGERY      RIGID ESOPHAGOSCOPY  04/18/2022   Procedure: RIGID ESOPHAGOSCOPY;  Surgeon: Melida Quitter, MD;  Location: [facility];  Service: ENT;;   TONSILLECTOMY  04/18/2022   Procedure: LEFT TONSILLECTOMY;  Surgeon: Melida Quitter, MD;  Location: [place];  Service: ENT;;   ULTRASOUND GUIDANCE FOR VASCULAR ACCESS Bilateral 08/29/2020   Procedure: ULTRASOUND GUIDANCE FOR VASCULAR ACCESS;  Surgeon: Elam Dutch, MD;  Location: [facility];  Service: Vascular;  Laterality: Bilateral;    Allergies  Allergies  Allergen Reactions   Zithromax [Azithromycin] Diarrhea    History of Present Illness    Cory [name] is a 76 y.o. male who presents via audio/video conferencing for a telehealth visit today.  Pt was last seen in cardiology clinic on 04/12/22 by Westley Foots, PA.  At that time TOBIN WITUCKI was doing well .  The patient is now pending procedure as outlined above. Since his last visit, he tells me he has been doing fine.  He has had very little episodes of atrial fibrillation.  He is symptomatic with these episodes.  He is protected on Xarelto.  He has not had any shortness of breath or chest pain.  He has no issues with walking, climbing stairs, or doing any indoor work as well as outdoor work.  For this reason he has scored a 6.36 on the DASI.  This exceeds the minimum of 4 METS.  Echocardiogram  05/16/31 reviewed and only significant finding was moderate aortic stenosis.  No previous echo to compare with.  Patient is asymptomatic at this time.  We discussed stopping his Xarelto 2 days prior to procedure.  Please restart medically safe to do so.  Home Medications    Prior to Admission medications   Medication Sig Start Date End Date Taking? Authorizing Provider  acetaminophen (TYLENOL) 500 MG tablet Take 500 mg by mouth every 6 (six) hours as needed (for pain.).    [provider]  cetirizine (ZYRTEC) 10 MG tablet Take 1 tablet (10 mg total) by mouth daily. Patient taking differently: Take 10  mg by mouth daily as needed for allergies. 12/10/16   Jaynee Eagles, PA-C  cholecalciferol (VITAMIN D) 25 MCG (1000 UNIT) tablet Take 1,000 Units by mouth 2 (two) times daily.    [provider]  diltiazem (TIADYLT ER) 120 MG 24 hr capsule TAKE 1 CAPSULE EVERY DAY (SCHEDULE OFFICE VISIT FOR FUTURE REFILLS) 03/09/22   [name], MD  HYDROcodone-acetaminophen (HYCET) 7.5-325 mg/15 ml solution Take 15 mLs by mouth every 6 (six) hours as needed for moderate pain. Patient not taking: Reported on 05/14/2022 04/18/22 04/18/23  Melida Quitter, MD  metoprolol tartrate (LOPRESSOR) 25 MG tablet Take 1 tablet (25 mg total) by mouth 2 (two) times daily as needed. 04/12/22   Warren Lacy, PA-C  nicotine (NICODERM CQ - DOSED IN MG/24 HOURS) 14 mg/24hr patch Place 1 patch (14 mg total) onto the skin daily. Apply 21 mg patch daily x 6 wk, then '14mg'$  patch daily x 2 wk, then 7 mg patch daily x 2 wk 05/08/22   [name], MD  nicotine (NICODERM CQ - DOSED IN MG/24 HOURS) 21 mg/24hr  patch Place 1 patch (21 mg total) onto the skin daily. Apply 21 mg patch daily x 6 wk, then '14mg'$  patch daily x 2 wk, then 7 mg patch daily x 2 wk 05/08/22   [name], MD  nicotine (NICODERM CQ - DOSED IN MG/24 HR) 7 mg/24hr patch Place 1 patch (7 mg total) onto the skin daily. Apply 21 mg patch daily x 6 wk, then '14mg'$  patch daily x 2 wk, then 7 mg patch daily x 2 wk 05/08/22   [name], MD  rivaroxaban (XARELTO) 20 MG TABS tablet TAKE 1 TABLET BY MOUTH ONCE DAILY WITH SUPPER 01/25/22   [name], MD  rosuvastatin (CRESTOR) 10 MG tablet Take 1 tablet (10 mg total) by mouth daily. Patient taking differently: Take 10 mg by mouth every evening. 04/26/21   Burchette, Alinda Sierras, MD  sodium chloride (OCEAN) 0.65 % SOLN nasal spray Place 1 spray into both nostrils as needed for congestion.    [provider]    Physical Exam    Vital Signs:  MURAD STAPLES does not have vital signs available for review today.  Given  telephonic nature of communication, physical exam is limited. AAOx3. NAD. Normal affect.  Speech and respirations are unlabored.  Accessory Clinical Findings    None  Assessment & Plan    1.  Preoperative Cardiovascular Risk Assessment:  Mr. Camerer perioperative risk of a major cardiac event is 0.4% according to the Revised Cardiac Risk Index (RCRI).  Therefore, he is at low risk for perioperative complications.   His functional capacity is good at 6.36 METs according to the [place] Activity Status Index (DASI). Recommendations: According to ACC/AHA guidelines, no further cardiovascular testing needed.  The patient may proceed to surgery at acceptable risk.   Antiplatelet and/or Anticoagulation Recommendations:  Xarelto (Rivaroxaban) can be held for 2 days prior to surgery.  Please resume post op when felt to be safe.     A copy of this note will be routed to requesting surgeon.  Time:   Today, I have spent 6 minutes with the patient with telehealth technology discussing medical history, symptoms, and management plan.     Elgie Collard, PA-C  05/17/2022, 9:22 AM

## 2022-05-18 ENCOUNTER — Telehealth: Payer: Self-pay | Admitting: Family Medicine

## 2022-05-18 DIAGNOSIS — C321 Malignant neoplasm of supraglottis: Secondary | ICD-10-CM | POA: Diagnosis not present

## 2022-05-18 MED ORDER — ROSUVASTATIN CALCIUM 10 MG PO TABS: 10.0000 mg | ORAL_TABLET | Freq: Every day | ORAL | 1 refills | Status: DC

## 2022-05-18 NOTE — Telephone Encounter (Signed)
Refill  rosuvastatin (CRESTOR) 10 MG tablet  pt not sure if provider wants him to continue with this medication

## 2022-05-18 NOTE — Telephone Encounter (Signed)
Rx sent 

## 2022-05-21 ENCOUNTER — Ambulatory Visit (HOSPITAL_BASED_OUTPATIENT_CLINIC_OR_DEPARTMENT_OTHER): Payer: Medicare HMO | Admitting: Anesthesiology

## 2022-05-21 ENCOUNTER — Encounter (HOSPITAL_COMMUNITY): Payer: Self-pay | Admitting: Gastroenterology

## 2022-05-21 ENCOUNTER — Ambulatory Visit (HOSPITAL_COMMUNITY): Payer: Medicare HMO | Admitting: Anesthesiology

## 2022-05-21 ENCOUNTER — Ambulatory Visit (HOSPITAL_COMMUNITY)
Admission: RE | Admit: 2022-05-21 | Discharge: 2022-05-21 | Disposition: A | Discharge status: Home / Self Care | Payer: Medicare HMO | Source: Ambulatory Visit | Attending: Gastroenterology | Admitting: Gastroenterology

## 2022-05-21 ENCOUNTER — Other Ambulatory Visit: Payer: Self-pay

## 2022-05-21 ENCOUNTER — Encounter (HOSPITAL_COMMUNITY)
Admission: RE | Disposition: A | Discharge status: Home / Self Care | Payer: Self-pay | Source: Ambulatory Visit | Attending: Gastroenterology

## 2022-05-21 DIAGNOSIS — K8689 Other specified diseases of pancreas: Secondary | ICD-10-CM | POA: Insufficient documentation

## 2022-05-21 DIAGNOSIS — M1990 Unspecified osteoarthritis, unspecified site: Secondary | ICD-10-CM

## 2022-05-21 DIAGNOSIS — R599 Enlarged lymph nodes, unspecified: Secondary | ICD-10-CM

## 2022-05-21 DIAGNOSIS — K209 Esophagitis, unspecified without bleeding: Secondary | ICD-10-CM | POA: Diagnosis not present

## 2022-05-21 DIAGNOSIS — F17210 Nicotine dependence, cigarettes, uncomplicated: Secondary | ICD-10-CM | POA: Diagnosis not present

## 2022-05-21 DIAGNOSIS — C321 Malignant neoplasm of supraglottis: Secondary | ICD-10-CM | POA: Diagnosis not present

## 2022-05-21 DIAGNOSIS — K3189 Other diseases of stomach and duodenum: Secondary | ICD-10-CM | POA: Diagnosis not present

## 2022-05-21 DIAGNOSIS — I714 Abdominal aortic aneurysm, without rupture, unspecified: Secondary | ICD-10-CM | POA: Diagnosis not present

## 2022-05-21 DIAGNOSIS — Z95820 Peripheral vascular angioplasty status with implants and grafts: Secondary | ICD-10-CM | POA: Diagnosis not present

## 2022-05-21 DIAGNOSIS — K2100 Gastro-esophageal reflux disease with esophagitis, without bleeding: Secondary | ICD-10-CM | POA: Diagnosis not present

## 2022-05-21 DIAGNOSIS — K319 Disease of stomach and duodenum, unspecified: Secondary | ICD-10-CM | POA: Diagnosis not present

## 2022-05-21 DIAGNOSIS — Z7901 Long term (current) use of anticoagulants: Secondary | ICD-10-CM | POA: Diagnosis not present

## 2022-05-21 DIAGNOSIS — Z79899 Other long term (current) drug therapy: Secondary | ICD-10-CM | POA: Insufficient documentation

## 2022-05-21 DIAGNOSIS — I888 Other nonspecific lymphadenitis: Secondary | ICD-10-CM | POA: Diagnosis not present

## 2022-05-21 DIAGNOSIS — I10 Essential (primary) hypertension: Secondary | ICD-10-CM | POA: Diagnosis not present

## 2022-05-21 DIAGNOSIS — K2970 Gastritis, unspecified, without bleeding: Secondary | ICD-10-CM | POA: Diagnosis not present

## 2022-05-21 DIAGNOSIS — K449 Diaphragmatic hernia without obstruction or gangrene: Secondary | ICD-10-CM

## 2022-05-21 DIAGNOSIS — R935 Abnormal findings on diagnostic imaging of other abdominal regions, including retroperitoneum: Secondary | ICD-10-CM | POA: Diagnosis not present

## 2022-05-21 DIAGNOSIS — R590 Localized enlarged lymph nodes: Secondary | ICD-10-CM | POA: Diagnosis not present

## 2022-05-21 DIAGNOSIS — K8020 Calculus of gallbladder without cholecystitis without obstruction: Secondary | ICD-10-CM | POA: Diagnosis not present

## 2022-05-21 DIAGNOSIS — I4891 Unspecified atrial fibrillation: Secondary | ICD-10-CM | POA: Diagnosis not present

## 2022-05-21 DIAGNOSIS — C760 Malignant neoplasm of head, face and neck: Secondary | ICD-10-CM

## 2022-05-21 DIAGNOSIS — K2289 Other specified disease of esophagus: Secondary | ICD-10-CM | POA: Diagnosis not present

## 2022-05-21 DIAGNOSIS — K31A19 Gastric intestinal metaplasia without dysplasia, unspecified site: Secondary | ICD-10-CM | POA: Diagnosis not present

## 2022-05-21 HISTORY — PX: FINE NEEDLE ASPIRATION: SHX5430

## 2022-05-21 HISTORY — PX: EUS: SHX5427

## 2022-05-21 HISTORY — PX: ESOPHAGOGASTRODUODENOSCOPY (EGD) WITH PROPOFOL: SHX5813

## 2022-05-21 HISTORY — PX: BIOPSY: SHX5522

## 2022-05-21 SURGERY — ESOPHAGOGASTRODUODENOSCOPY (EGD) WITH PROPOFOL
Anesthesia: Monitor Anesthesia Care

## 2022-05-21 MED ORDER — PROPOFOL 10 MG/ML IV BOLUS
INTRAVENOUS | Status: DC | PRN
  Administered 2022-05-21: 20 mg via INTRAVENOUS
  Administered 2022-05-21: 30 mg via INTRAVENOUS
  Administered 2022-05-21 (×2): 20 mg via INTRAVENOUS
  Administered 2022-05-21: 40 mg via INTRAVENOUS

## 2022-05-21 MED ORDER — OMEPRAZOLE 20 MG PO CPDR: 20.0000 mg | DELAYED_RELEASE_CAPSULE | Freq: Every day | ORAL | 6 refills | Status: DC

## 2022-05-21 MED ORDER — PROPOFOL 1000 MG/100ML IV EMUL
INTRAVENOUS | Status: AC
  Filled 2022-05-21: qty 100

## 2022-05-21 MED ORDER — OMEPRAZOLE 40 MG PO CPDR: 40.0000 mg | DELAYED_RELEASE_CAPSULE | Freq: Every day | ORAL | 6 refills | Status: DC

## 2022-05-21 MED ORDER — LIDOCAINE 2% (20 MG/ML) 5 ML SYRINGE
INTRAMUSCULAR | Status: DC | PRN
  Administered 2022-05-21: 100 mg via INTRAVENOUS

## 2022-05-21 MED ORDER — RIVAROXABAN 20 MG PO TABS: 20.0000 mg | ORAL_TABLET | Freq: Every day | ORAL | 1 refills | Status: DC

## 2022-05-21 MED ORDER — DEXMEDETOMIDINE HCL IN NACL 80 MCG/20ML IV SOLN
INTRAVENOUS | Status: DC | PRN
  Administered 2022-05-21 (×2): 4 ug via BUCCAL

## 2022-05-21 MED ORDER — PHENYLEPHRINE HCL-NACL 20-0.9 MG/250ML-% IV SOLN
INTRAVENOUS | Status: DC | PRN
  Administered 2022-05-21: 40 ug/min via INTRAVENOUS

## 2022-05-21 MED ORDER — LACTATED RINGERS IV SOLN
INTRAVENOUS | Status: AC | PRN
  Administered 2022-05-21: 1000 mL via INTRAVENOUS

## 2022-05-21 MED ORDER — PROPOFOL 500 MG/50ML IV EMUL
INTRAVENOUS | Status: DC | PRN
  Administered 2022-05-21: 75 ug/kg/min via INTRAVENOUS

## 2022-05-21 MED ORDER — PHENYLEPHRINE 80 MCG/ML (10ML) SYRINGE FOR IV PUSH (FOR BLOOD PRESSURE SUPPORT)
PREFILLED_SYRINGE | INTRAVENOUS | Status: DC | PRN
  Administered 2022-05-21: 40 ug via INTRAVENOUS

## 2022-05-21 MED ORDER — SODIUM CHLORIDE 0.9 % IV SOLN: INTRAVENOUS | Status: DC

## 2022-05-21 SURGICAL SUPPLY — 15 items

## 2022-05-21 NOTE — Anesthesia Procedure Notes (Signed)
Procedure Name: MAC Date/Time: 05/21/2022 1:46 PM  Performed by: Deliah Boston, CRNAPre-anesthesia Checklist: Patient identified, Emergency Drugs available, Suction available and Patient being monitored Patient Re-evaluated:Patient Re-evaluated prior to induction Oxygen Delivery Method: Simple face mask Preoxygenation: Pre-oxygenation with 100% oxygen Induction Type: IV induction Placement Confirmation: positive ETCO2 and breath sounds checked- equal and bilateral

## 2022-05-21 NOTE — Anesthesia Postprocedure Evaluation (Signed)
Anesthesia Post Note  Patient: Cory [name]  Procedure(s) Performed: ESOPHAGOGASTRODUODENOSCOPY (EGD) WITH PROPOFOL UPPER ENDOSCOPIC ULTRASOUND (EUS) RADIAL BIOPSY     Patient location during evaluation: Endoscopy Anesthesia Type: MAC Level of consciousness: awake and alert, patient cooperative and oriented Pain management: pain level controlled Vital Signs Assessment: post-procedure vital signs reviewed and stable Respiratory status: nonlabored ventilation, spontaneous breathing and respiratory function stable Cardiovascular status: blood pressure returned to baseline and stable Postop Assessment: no apparent nausea or vomiting Anesthetic complications: no   No notable events documented.  Last Vitals:  Vitals:   05/21/22 1451 05/21/22 1501  BP: (!) 126/55 127/87  Pulse: 60 (!) 59  Resp: 18 19  Temp: 36.9 C   SpO2: 95% 94%    Last Pain:  Vitals:   05/21/22 1501  TempSrc:   PainSc: 0-No pain                 Cashius Grandstaff,E. Caylah Plouff

## 2022-05-21 NOTE — Anesthesia Preprocedure Evaluation (Addendum)
Anesthesia Evaluation  Patient identified by MRN, date of birth, ID band Patient awake    Reviewed: Allergy & Precautions, NPO status , Patient's Chart, lab work & pertinent test results, reviewed documented beta blocker date and time   History of Anesthesia Complications Negative for: history of anesthetic complications  Airway Mallampati: II  TM Distance: >3 FB Neck ROM: Full    Dental  (+) Dental Advisory Given   Pulmonary Current SmokerPatient did not abstain from smoking.,    breath sounds clear to auscultation       Cardiovascular hypertension, Pt. on medications and Pt. on home beta blockers (-) angina+ Peripheral Vascular Disease (AAA stent graft, fem-pop)  + dysrhythmias Atrial Fibrillation + Valvular Problems/Murmurs AS  Rhythm:Irregular Rate:Normal + Systolic murmurs 70/62/3762 ECHO:EF 60-65%, normal LVF, mild LVH, grade 1 DD, normal RVF, mod AS with mean grad 22 mmHg, peal grad 44.6 mmHg, mod asc aortic dilation 45 mm   Neuro/Psych negative neurological ROS     GI/Hepatic negative GI ROS, Neg liver ROS,   Endo/Other  negative endocrine ROS  Renal/GU negative Renal ROS     Musculoskeletal  (+) Arthritis ,   Abdominal   Peds  Hematology Xarelto: last dose 10/13   Anesthesia Other Findings Head and neck cancer: supraglottic  Reproductive/Obstetrics                           Anesthesia Physical Anesthesia Plan  ASA: 4  Anesthesia Plan: MAC   Post-op Pain Management: Minimal or no pain anticipated   Induction:   PONV Risk Score and Plan: 0  Airway Management Planned: Nasal Cannula and Natural Airway  Additional Equipment: None  Intra-op Plan:   Post-operative Plan:   Informed Consent:     Dental advisory given  Plan Discussed with: CRNA and Surgeon  Anesthesia Plan Comments:        Anesthesia Quick Evaluation

## 2022-05-21 NOTE — Discharge Instructions (Signed)
YOU HAD AN ENDOSCOPIC PROCEDURE TODAY: Refer to the procedure report and other information in the discharge instructions given to you for any specific questions about what was found during the examination. If this information does not answer your questions, please call [facility] office at [phone] to clarify.  ° °YOU SHOULD EXPECT: Some feelings of bloating in the abdomen. Passage of more gas than usual. Walking can help get rid of the air that was put into your GI tract during the procedure and reduce the bloating. If you had a lower endoscopy (such as a colonoscopy or flexible sigmoidoscopy) you may notice spotting of blood in your stool or on the toilet paper. Some abdominal soreness may be present for a day or two, also. ° °DIET: Your first meal following the procedure should be a light meal and then it is ok to progress to your normal diet. A half-sandwich or bowl of soup is an example of a good first meal. Heavy or fried foods are harder to digest and may make you feel nauseous or bloated. Drink plenty of fluids but you should avoid alcoholic beverages for 24 hours. If you had a esophageal dilation, please see attached instructions for diet.   ° °ACTIVITY: Your care partner should take you home directly after the procedure. You should plan to take it easy, moving slowly for the rest of the day. You can resume normal activity the day after the procedure however YOU SHOULD NOT DRIVE, use power tools, machinery or perform tasks that involve climbing or major physical exertion for 24 hours (because of the sedation medicines used during the test).  ° °SYMPTOMS TO REPORT IMMEDIATELY: °A gastroenterologist can be reached at any hour. Please call [phone]  for any of the following symptoms:  °Following lower endoscopy (colonoscopy, flexible sigmoidoscopy) °Excessive amounts of blood in the stool  °Significant tenderness, worsening of abdominal pains  °Swelling of the abdomen that is new, acute  °Fever of 100° or  higher  °Following upper endoscopy (EGD, EUS, ERCP, esophageal dilation) °Vomiting of blood or coffee ground material  °New, significant abdominal pain  °New, significant chest pain or pain under the shoulder blades  °Painful or persistently difficult swallowing  °New shortness of breath  °Black, tarry-looking or red, bloody stools ° °FOLLOW UP:  °If any biopsies were taken you will be contacted by phone or by letter within the next 1-3 weeks. Call [phone]  if you have not heard about the biopsies in 3 weeks.  °Please also call with any specific questions about appointments or follow up tests. ° °

## 2022-05-21 NOTE — H&P (Signed)
GASTROENTEROLOGY PROCEDURE H&P NOTE   Primary Care Physician: [name], MD  HPI: Cory [name] is a 76 y.o. male who presents for EGD/EUS to try to evaluate and try to sample an aortocaval lymph node noted on imaging in setting of recently diagnosed head and neck cancer.  Past Medical History:  Diagnosis Date   Aortic stenosis    a. Moderate by echo 05/2020.   Arthritis    right hip   Cancer ([place])    skin cancer nose, right left, upper left basel cell   Dysrhythmia    a-fib   PAF (paroxysmal atrial fibrillation) (HCC)    Peripheral arterial disease (HCC)    Tobacco abuse    Past Surgical History:  Procedure Laterality Date   ABDOMINAL AORTIC ENDOVASCULAR STENT GRAFT N/A 08/29/2020   Procedure: ABDOMINAL AORTIC ENDOVASCULAR STENT GRAFT;  Surgeon: Elam Dutch, MD;  Location: [place];  Service: Vascular;  Laterality: N/A;   ABDOMINAL AORTOGRAM W/LOWER EXTREMITY Bilateral 06/24/2020   Procedure: ABDOMINAL AORTOGRAM W/LOWER EXTREMITY;  Surgeon: Elam Dutch, MD;  Location: [place] [facility];  Service: Cardiovascular;  Laterality: Bilateral;   BACK SURGERY     2005   CERVICAL SPINE SURGERY     DIRECT LARYNGOSCOPY Bilateral 04/18/2022   Procedure: DIRECT LARYNGOSCOPY WITH BIOPSIES;  Surgeon: Melida Quitter, MD;  Location: [place];  Service: ENT;  Laterality: Bilateral;   EMBOLECTOMY Right 08/29/2020   Procedure: POPLITEAL AND TIBIAL EMBOLECTOMY;  Surgeon: Elam Dutch, MD;  Location: [place];  Service: Vascular;  Laterality: Right;   EMBOLIZATION Right 06/24/2020   Procedure: EMBOLIZATION;  Surgeon: Elam Dutch, MD;  Location: [place] [facility];  Service: Cardiovascular;  Laterality: Right;   ENDARTERECTOMY FEMORAL Right 08/29/2020   Procedure: ENDARTERECTOMY FEMORAL;  Surgeon: Elam Dutch, MD;  Location: [place];  Service: Vascular;  Laterality: Right;   EYE SURGERY     FEMORAL ARTERY EXPLORATION Right 06/25/2020   Procedure: RIGHT GROIN EXPLORATION  Evacuation of Hematoma  WITH REPAIR OF RIGHT Common FEMORAL ARTERY.;  Surgeon: Angelia Mould, MD;  Location: [place];  Service: Vascular;  Laterality: Right;   FEMORAL-POPLITEAL BYPASS GRAFT Left 11/22/2020   Procedure: LEFT ABOVE KNEE-BELOW KNEE BYPASS TIBIAL PERITONEAL TRUNK REVERSE IPSILATERAL GREATER SAPHENOUS VEIN;  Surgeon: Elam Dutch, MD;  Location: [place];  Service: Vascular;  Laterality: Left;   LEG SURGERY     Trauma   LOWER EXTREMITY ANGIOGRAPHY Left 11/17/2020   Procedure: Lower Extremity Angiography;  Surgeon: Marty Heck, MD;  Location: [facility] [facility];  Service: Cardiovascular;  Laterality: Left;   PATCH ANGIOPLASTY Right 08/29/2020   Procedure: PATCH ANGIOPLASTY RIGHT BELOW KNEE POPLITEAL ARTERY AND RIGHT COMMON FEMORAL ARTERY;  Surgeon: Elam Dutch, MD;  Location: [facility];  Service: Vascular;  Laterality: Right;   RETINAL DETACHMENT SURGERY     RIGID ESOPHAGOSCOPY  04/18/2022   Procedure: RIGID ESOPHAGOSCOPY;  Surgeon: Melida Quitter, MD;  Location: [place];  Service: ENT;;   TONSILLECTOMY  04/18/2022   Procedure: LEFT TONSILLECTOMY;  Surgeon: Melida Quitter, MD;  Location: [place];  Service: ENT;;   ULTRASOUND GUIDANCE FOR VASCULAR ACCESS Bilateral 08/29/2020   Procedure: ULTRASOUND GUIDANCE FOR VASCULAR ACCESS;  Surgeon: Elam Dutch, MD;  Location: [place];  Service: Vascular;  Laterality: Bilateral;   Current Facility-Administered Medications  Medication Dose Route Frequency Provider Last Rate Last Admin   0.9 %  sodium chloride infusion   Intravenous Continuous Mansouraty, Valarie Merino  Brooke Bonito., MD        Current Facility-Administered Medications:    0.9 %  sodium chloride infusion, , Intravenous, Continuous, Mansouraty, Telford Nab., MD Allergies  Allergen Reactions   Zithromax [Azithromycin] Diarrhea   Family History  Problem Relation Age of Onset   Ovarian cancer Mother    Alcoholism Father    Breast cancer Sister    Ovarian cancer Sister    Social  History   Socioeconomic History   Marital status: Married    Spouse name: Not on file   Number of children: 5   Years of education: Not on file   Highest education level: Master's degree (e.g., MA, MS, MEng, MEd, MSW, MBA)  Occupational History   Not on file  Tobacco Use   Smoking status: Every Day    Packs/day: 0.50    Years: 50.00    Total pack years: 25.00    Types: Cigarettes    Passive exposure: Never   Smokeless tobacco: Never  Vaping Use   Vaping Use: Never used  Substance and Sexual Activity   Alcohol use: Yes    Alcohol/week: 0.0 standard drinks of alcohol    Comment: 4 to 5 times a week; beer, wine and liquor    Drug use: No   Sexual activity: Not Currently  Other Topics Concern   Not on file  Social History Narrative   Scores reading tests.  Lives with wife.     Social Determinants of Health   Financial Resource Strain: Low Risk  (01/08/2022)   Overall Financial Resource Strain (CARDIA)    Difficulty of Paying Living Expenses: Not hard at all  Food Insecurity: No Food Insecurity (01/08/2022)   Hunger Vital Sign    Worried About Running Out of Food in the Last Year: Never true    Ran Out of Food in the Last Year: Never true  Transportation Needs: No Transportation Needs (01/08/2022)   PRAPARE - Hydrologist (Medical): No    Lack of Transportation (Non-Medical): No  Physical Activity: Insufficiently Active (01/08/2022)   Exercise Vital Sign    Days of Exercise per Week: 3 days    Minutes of Exercise per Session: 30 min  Stress: No Stress Concern Present (01/08/2022)   [place]    Feeling of Stress : Not at all  Social Connections: Moderately Isolated (01/08/2022)   Social Connection and Isolation Panel [NHANES]    Frequency of Communication with Friends and Family: More than three times a week    Frequency of Social Gatherings with Friends and Family: Three times a week     Attends Religious Services: Never    Active Member of Clubs or Organizations: No    Attends Archivist Meetings: Never    Marital Status: Married  Human resources officer Violence: Not At Risk (06/07/2021)   Humiliation, Afraid, Rape, and Kick questionnaire    Fear of Current or Ex-Partner: No    Emotionally Abused: No    Physically Abused: No    Sexually Abused: No    Physical Exam: There were no vitals filed for this visit. There is no height or weight on file to calculate BMI. GEN: NAD EYE: Sclerae anicteric ENT: MMM CV: Non-tachycardic GI: Soft, NT/ND NEURO:  Alert & Oriented x 3  Lab Results: No results for input(s): "WBC", "HGB", "HCT", "PLT" in the last 72 hours. BMET No results for input(s): "NA", "K", "CL", "CO2", "GLUCOSE", "BUN", "  CREATININE", "CALCIUM" in the last 72 hours. LFT No results for input(s): "PROT", "ALBUMIN", "AST", "ALT", "ALKPHOS", "BILITOT", "BILIDIR", "IBILI" in the last 72 hours. PT/INR No results for input(s): "LABPROT", "INR" in the last 72 hours.   Impression / Plan: This is a 76 y.o.male who presents for EGD/EUS to try to evaluate and try to sample an aortocaval lymph node noted on imaging in setting of recently diagnosed head and neck cancer.  The risks of an EUS including intestinal perforation, bleeding, infection, aspiration, and medication effects were discussed as was the possibility it may not give a definitive diagnosis if a biopsy is performed.  When a biopsy of the pancreas is done as part of the EUS, there is an additional risk of pancreatitis at the rate of about 1-2%.  It was explained that procedure related pancreatitis is typically mild, although it can be severe and even life threatening, which is why we do not perform random pancreatic biopsies and only biopsy a lesion/area we feel is concerning enough to warrant the risk.   The risks and benefits of endoscopic evaluation/treatment were discussed with the patient and/or  family; these include but are not limited to the risk of perforation, infection, bleeding, missed lesions, lack of diagnosis, severe illness requiring hospitalization, as well as anesthesia and sedation related illnesses.  The patient's history has been reviewed, patient examined, no change in status, and deemed stable for procedure.  The patient and/or family is agreeable to proceed.    Justice Britain, MD [facility] Office # [phone]

## 2022-05-21 NOTE — Op Note (Signed)
[facility] Patient Name: Cory [name] Procedure Date: 05/21/2022 MRN: [id number] Attending MD: Justice Britain , MD Date of Birth: [birth year] CSN: [id number] Age: 76 Admit Type: Outpatient Procedure:                Upper EUS Indications:              Abnormal abdominal PET scan Providers:                Justice Britain, MD, Burtis Junes, RN, Cletis Athens,                            Technician Referring MD:             Benay Pike, Wyvonnia Lora, [name] Medicines:                Monitored Anesthesia Care Complications:            No immediate complications. Estimated Blood Loss:     Estimated blood loss was minimal. Procedure:                Pre-Anesthesia Assessment:                           - Prior to the procedure, a History and Physical                            was performed, and patient medications and                            allergies were reviewed. The patient's tolerance of                            previous anesthesia was also reviewed. The risks                            and benefits of the procedure and the sedation                            options and risks were discussed with the patient.                            All questions were answered, and informed consent                            was obtained. Prior Anticoagulants: The patient has                            taken Xarelto (rivaroxaban), last dose was 2 days                            prior to procedure. ASA Grade Assessment: III - A                            patient with severe systemic disease. After  reviewing the risks and benefits, the patient was                            deemed in satisfactory condition to undergo the                            procedure.                           After obtaining informed consent, the endoscope was                            passed under direct vision. Throughout the                             procedure, the patient's blood pressure, pulse, and                            oxygen saturations were monitored continuously. The                            [id number] ([id number] Olympus therapeutic endoscope                            was introduced through the mouth, and advanced to                            the second part of duodenum. The [id number]                            ([id number]) Olympus linear ultrasound scope was                            introduced through the mouth, and advanced to the                            duodenum for ultrasound examination from the                            stomach and duodenum. The upper EUS was                            accomplished without difficulty. The patient                            tolerated the procedure. Scope In: Scope Out: Findings:      ENDOSCOPIC FINDING: :      No gross lesions were noted in the majority of the esophagus.      LA Grade A (one or more mucosal breaks less than 5 mm, not extending       between tops of 2 mucosal folds) esophagitis with no bleeding was found       at the very distal esophagus and at the gastroesophageal junction.      A 1 cm hiatal hernia was present.        The Z-line was irregular and was found 44 cm from the incisors.      Patchy moderate inflammation characterized by erosions and granularity       was found in the entire examined stomach. Biopsies were taken with a       cold forceps for histology and Helicobacter pylori testing.      Patchy mildly erythematous mucosa without active bleeding and with no       stigmata of bleeding was found in the duodenal bulb, in the first       portion of the duodenum and in the second portion of the duodenum.      ENDOSONOGRAPHIC FINDING: :      One enlarged lymph node was visualized in the porta hepatis region. It       measured 14 mm by 12 mm in maximal cross-sectional diameter. The node       was rounded, had hypoechoic foci and had well defined margins.  This is       different than the previously noted aortocaval lymph node in the       inferior aspect of the pancreas (even in a third station of the       echoendoscope I could not visualize that particular lymph node). Fine       needle biopsy was performed of the periportal lymph node. Color Doppler       imaging was utilized prior to needle puncture to confirm a lack of       significant vascular structures within the needle path. Seven passes       were made with the acquire 22 gauge ultrasound core biopsy needle using       a transduodenal approach. Visible cores of tissue were obtained.       Preliminary cytologic examination and touch preps were performed. Final       cytology results are pending.      Pancreatic parenchymal abnormalities were noted in the entire pancreas.       These consisted of atrophy, lobularity without honeycombing and       hyperechoic strands. The pancreatic duct in the head measured 1.1 mm,       the pancreatic duct in the neck measured 1.8 mm, the pancreatic duct in       the body measured 2.0 mm, pancreatic duct in the tail measures 1.0 mm.      There was no sign of significant endosonographic abnormality in the       common bile duct and in the common hepatic duct.      A stone and significant sludge was visualized endosonographically in the       gallbladder. This was characterized by shadowing.      Endosonographic imaging in the visualized portion of the liver showed no       mass.      The celiac region was visualized. Impression:               EGD impression:                           - No gross lesions in majority of esophagus. LA                            Grade A esophagitis with no bleeding in the very                              distal esophagus and GE junction.                           - 1 cm hiatal hernia.                           - Z-line irregular, 44 cm from the incisors.                           - Gastritis. Biopsied.                            - Erythematous duodenopathy.                           EUS impression:                           - One enlarged lymph node was visualized in the                            porta hepatis region. Fine needle biopsy performed.                           - Attempted visualization of the aortocaval lymph                            node that was noted on the PET/CT was not possible                            even with good apposition in the third station of                            the echoendoscope procedure.                           - Pancreatic parenchymal abnormalities consisting                            of atrophy, lobularity and hyperechoic strands were                            noted in the entire pancreas. No pancreatic ductal                            abnormalities.                           - There was no sign of significant pathology in the                            common bile duct and in the common hepatic duct.                           - Stone and sludge was visualized                              endosonographically in the gallbladder. Moderate Sedation:      Not Applicable - Patient had care per Anesthesia. Recommendation:           - The patient will be observed post-procedure,                            until all discharge criteria are met.                           - Discharge patient to home.                           - Patient has a contact number available for                            emergencies. The signs and symptoms of potential                            delayed complications were discussed with the                            patient. Return to normal activities tomorrow.                            Written discharge instructions were provided to the                            patient.                           - Resume previous diet.                           - Observe patient's clinical course.                           - Await cytology results and  await path results.                           - May restart rivaroxaban in 48 hours (10/18 p.m.).                           - Initiate omeprazole 40 mg daily.                           - Return to oncology clinic as already scheduled.                           - The findings and recommendations were discussed                            with the patient.                           - The findings and recommendations were discussed                              with the patient's family.                           - The findings and recommendations were discussed                            with the referring physician. Procedure Code(s):        --- Professional ---                           43238, Esophagogastroduodenoscopy, flexible,                            transoral; with transendoscopic ultrasound-guided                            intramural or transmural fine needle                            aspiration/biopsy(s), (includes endoscopic                            ultrasound examination limited to the esophagus,                            stomach or duodenum, and adjacent structures) Diagnosis Code(s):        --- Professional ---                           K20.90, Esophagitis, unspecified without bleeding                           K44.9, Diaphragmatic hernia without obstruction or                            gangrene                           K22.8, Other specified diseases of esophagus                           K29.70, Gastritis, unspecified, without bleeding                           K31.89, Other diseases of stomach and duodenum                           R59.0, Localized enlarged lymph nodes                           K86.9, Disease of pancreas, unspecified                           K80.20, Calculus of gallbladder without                            cholecystitis without obstruction                             R93.5, Abnormal findings on diagnostic imaging of                            other  abdominal regions, including retroperitoneum CPT copyright 2019 [employer]. All rights reserved. The codes documented in this report are preliminary and upon coder review may  be revised to meet current compliance requirements. Justice Britain, MD 05/21/2022 3:00:25 PM Number of Addenda: 0

## 2022-05-21 NOTE — Transfer of Care (Signed)
Immediate Anesthesia Transfer of Care Note  Patient: Cory [name]  Procedure(s) Performed: ESOPHAGOGASTRODUODENOSCOPY (EGD) WITH PROPOFOL UPPER ENDOSCOPIC ULTRASOUND (EUS) RADIAL BIOPSY  Patient Location: PACU and Endoscopy Unit  Anesthesia Type:MAC  Level of Consciousness: awake  Airway & Oxygen Therapy: Patient Spontanous Breathing and Patient connected to face mask oxygen  Post-op Assessment: Report given to RN and Post -op Vital signs reviewed and stable  Post vital signs: Reviewed and stable  Last Vitals:  Vitals Value Taken Time  BP 126/55 05/21/22 1451  Temp 36.9 C 05/21/22 1451  Pulse 62 05/21/22 1453  Resp 18 05/21/22 1453  SpO2 94 % 05/21/22 1453  Vitals shown include unvalidated device data.  Last Pain:  Vitals:   05/21/22 1451  TempSrc: Tympanic  PainSc: Asleep         Complications: No notable events documented.

## 2022-05-22 LAB — SURGICAL PATHOLOGY

## 2022-05-23 ENCOUNTER — Encounter: Payer: Self-pay | Admitting: Gastroenterology

## 2022-05-23 ENCOUNTER — Other Ambulatory Visit: Payer: Self-pay

## 2022-05-23 ENCOUNTER — Ambulatory Visit
Admission: RE | Admit: 2022-05-23 | Discharge: 2022-05-23 | Disposition: A | Discharge status: Home / Self Care | Payer: Medicare HMO | Source: Ambulatory Visit | Attending: Radiation Oncology | Admitting: Radiation Oncology

## 2022-05-23 DIAGNOSIS — Z510 Encounter for antineoplastic radiation therapy: Secondary | ICD-10-CM | POA: Diagnosis not present

## 2022-05-23 DIAGNOSIS — C321 Malignant neoplasm of supraglottis: Secondary | ICD-10-CM | POA: Insufficient documentation

## 2022-05-23 DIAGNOSIS — C329 Malignant neoplasm of larynx, unspecified: Secondary | ICD-10-CM

## 2022-05-23 DIAGNOSIS — Z5111 Encounter for antineoplastic chemotherapy: Secondary | ICD-10-CM | POA: Insufficient documentation

## 2022-05-23 MED ORDER — SODIUM CHLORIDE 0.9% FLUSH: 10.0000 mL | INTRAVENOUS | Status: DC | PRN

## 2022-05-23 MED ORDER — SODIUM CHLORIDE 0.9% FLUSH
10.0000 mL | INTRAVENOUS | Status: DC | PRN
  Administered 2022-05-23: 10 mL via INTRAVENOUS

## 2022-05-23 NOTE — Therapy (Signed)
OUTPATIENT PHYSICAL THERAPY HEAD AND NECK BASELINE EVALUATION   Patient Name: Cory [name] MRN: [id number] DOB:Sep 03, 1945, 76 y.o., male Today's Date: 05/24/2022   PT End of Session - 05/24/22 0959     Visit Number 1    Number of Visits 2    Date for PT Re-Evaluation 08/02/22    PT Start Time 0935    PT Stop Time 0952    PT Time Calculation (min) 17 min    Activity Tolerance Patient tolerated treatment well    Behavior During Therapy [facility] for tasks assessed/performed             Past Medical History:  Diagnosis Date   Aortic stenosis    a. Moderate by echo 05/2020.   Arthritis    right hip   Cancer ([place])    skin cancer nose, right left, upper left basel cell   Dysrhythmia    a-fib   PAF (paroxysmal atrial fibrillation) (HCC)    Peripheral arterial disease (HCC)    Tobacco abuse    Past Surgical History:  Procedure Laterality Date   ABDOMINAL AORTIC ENDOVASCULAR STENT GRAFT N/A 08/29/2020   Procedure: ABDOMINAL AORTIC ENDOVASCULAR STENT GRAFT;  Surgeon: Elam Dutch, MD;  Location: [place];  Service: Vascular;  Laterality: N/A;   ABDOMINAL AORTOGRAM W/LOWER EXTREMITY Bilateral 06/24/2020   Procedure: ABDOMINAL AORTOGRAM W/LOWER EXTREMITY;  Surgeon: Elam Dutch, MD;  Location: [place] [facility];  Service: Cardiovascular;  Laterality: Bilateral;   BACK SURGERY     2005   CERVICAL SPINE SURGERY     DIRECT LARYNGOSCOPY Bilateral 04/18/2022   Procedure: DIRECT LARYNGOSCOPY WITH BIOPSIES;  Surgeon: Melida Quitter, MD;  Location: [place];  Service: ENT;  Laterality: Bilateral;   EMBOLECTOMY Right 08/29/2020   Procedure: POPLITEAL AND TIBIAL EMBOLECTOMY;  Surgeon: Elam Dutch, MD;  Location: [place];  Service: Vascular;  Laterality: Right;   EMBOLIZATION Right 06/24/2020   Procedure: EMBOLIZATION;  Surgeon: Elam Dutch, MD;  Location: [place] [facility];  Service: Cardiovascular;  Laterality: Right;   ENDARTERECTOMY FEMORAL Right 08/29/2020   Procedure:  ENDARTERECTOMY FEMORAL;  Surgeon: Elam Dutch, MD;  Location: [place];  Service: Vascular;  Laterality: Right;   EYE SURGERY     FEMORAL ARTERY EXPLORATION Right 06/25/2020   Procedure: RIGHT GROIN EXPLORATION Evacuation of Hematoma  WITH REPAIR OF RIGHT Common FEMORAL ARTERY.;  Surgeon: Angelia Mould, MD;  Location: [place];  Service: Vascular;  Laterality: Right;   FEMORAL-POPLITEAL BYPASS GRAFT Left 11/22/2020   Procedure: LEFT ABOVE KNEE-BELOW KNEE BYPASS TIBIAL PERITONEAL TRUNK REVERSE IPSILATERAL GREATER SAPHENOUS VEIN;  Surgeon: Elam Dutch, MD;  Location: [place];  Service: Vascular;  Laterality: Left;   LEG SURGERY     Trauma   LOWER EXTREMITY ANGIOGRAPHY Left 11/17/2020   Procedure: Lower Extremity Angiography;  Surgeon: Marty Heck, MD;  Location: [place] [facility];  Service: Cardiovascular;  Laterality: Left;   PATCH ANGIOPLASTY Right 08/29/2020   Procedure: PATCH ANGIOPLASTY RIGHT BELOW KNEE POPLITEAL ARTERY AND RIGHT COMMON FEMORAL ARTERY;  Surgeon: Elam Dutch, MD;  Location: [place];  Service: Vascular;  Laterality: Right;   RETINAL DETACHMENT SURGERY     RIGID ESOPHAGOSCOPY  04/18/2022   Procedure: RIGID ESOPHAGOSCOPY;  Surgeon: Melida Quitter, MD;  Location: [place];  Service: ENT;;   TONSILLECTOMY  04/18/2022   Procedure: LEFT TONSILLECTOMY;  Surgeon: Melida Quitter, MD;  Location: [place];  Service: ENT;;   ULTRASOUND GUIDANCE FOR VASCULAR  ACCESS Bilateral 08/29/2020   Procedure: ULTRASOUND GUIDANCE FOR VASCULAR ACCESS;  Surgeon: Elam Dutch, MD;  Location: [facility];  Service: Vascular;  Laterality: Bilateral;   Patient Active Problem List   Diagnosis Date Noted   Malignant neoplasm of supraglottis ([place]) 05/08/2022   Cancer of supraglottis ([place]) 05/04/2022   Rib pain on left side 12/01/2021   Chronic sore throat 08/15/2021   Dyslipidemia 07/17/2021   AAA (abdominal aortic aneurysm) ([place]) 08/29/2020   PAD (peripheral artery disease) ([place]) 06/25/2020    Educated about COVID-19 virus infection 05/12/2020   Tobacco abuse 05/12/2020   Peripheral vascular disease ([place]) 04/25/2020   Abdominal aortic aneurysm (AAA) ([place]) 04/25/2020   Essential hypertension 05/16/2018   Medication management 05/16/2018   Piriformis syndrome of right side 05/06/2018   Allergy 10/29/2017   Recurrent sinusitis 10/29/2017   Atrial fibrillation, transient ([place]) 06/07/2015   Aortic stenosis    ABNORMAL ELECTROCARDIOGRAM 07/14/2009    PCP: Carolann Littler, MD  REFERRING PROVIDER: [name], MD   REFERRING DIAG: C32.1 (ICD-10-CM) - Malignant neoplasm of supraglottis ([place])   THERAPY DIAG:  Abnormal posture - Plan: PT plan of care cert/re-cert  Malignant neoplasm of supraglottis ([place]) - Plan: PT plan of care cert/re-cert  Rationale for Evaluation and Treatment Rehabilitation  ONSET DATE: 11/08/21  SUBJECTIVE    SUBJECTIVE STATEMENT: Patient reports they are here today to be seen by their medical team for newly diagnosed cancer of supraglottis.    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 He presented to Dr. Redmond Baseman on 08/15/21 with c/o persistent left sided sore throat since August 2022. Laryngoscopy completed at this time was unremarkable. 11/08/21 CT neck revealed a sub-centimeter focus of mucosal hyperenhancement along the posterior aspect of the uvula, and a few tiny cystic appearing foci along the left glosso-tonsillar sulcus. CT also showed a nonspecific mildly enlarged right level 2 lymph node measuring 14 mm in the short axis, and multiple small calcific foci within the left palatine tonsil possibly reflective of postinflammatory calcifications and/or tonsilloliths. 11/21/21 repeat Laryngoscopy by Dr. Redmond Baseman again was unremarkable. Due to persistent throat pain Dr. Redmond Baseman advised proceeding with a direct larynscopy and rigid esophagosopy with biopsies.  04/18/22 Left laryngeal biopsies showed invasive  moderately differentiated SCC. 05/07/22 PET completed showing Asymmetric Hypermetabolism in the left oropharynx and involving the left epiglottis. Imaging features compatible with primary neoplasm. (I believe some of the oropharynx SUV uptake could be r/t abscess, recent biopsy), Contralateral (right-sided) hypermetabolic level II cervical lymph nodes compatible with metastatic disease. No hypermetabolic lymphadenopathy in the left neck, and 1.2 cm short axis aortocaval lymph node in the upper abdomen is hypermetabolic. Metastatic disease is a concern. This lymph node has increased from 0.7 cm on a study from 2022. 05/21/22 Biopsy of the lymph node in his abdomen with CT simulation planned for 05/23/22. Treatment plan will depend on results of the biopsy on 10/16 but will most likely be weekly chemo and 35 fractions of radiation. He will receive 35 fractions of radiation and weekly chemotherapy to his larynx and bilateral neck.  He is scheduled to start on 05/30/22 and complete 07/19/22 but this may change depending on when chemotherapy can start.   PATIENT GOALS   to be educated about the signs and symptoms of lymphedema and learn post op HEP.   PAIN:  Are you having pain? No pt reports no pain just a sore throat   PRECAUTIONS: Active CA  WEIGHT BEARING RESTRICTIONS  No  FALLS:  Has patient fallen in last 6 months? No Does the patient have a fear of falling that limits activity? 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  LEISURE: pt reports he walks, does lawn work, gardens  PRIOR LEVEL OF FUNCTION: Independent   OBJECTIVE  COGNITION:           Overall cognitive status: Within functional limits for tasks assessed                  POSTURE:  Forward head and rounded  shoulders posture   30 SEC SIT TO STAND: 12 reps in 30 sec without use of UEs which is  Average for patient's age   SHOULDER AROM:   WFL    CERVICAL AROM:   Percent limited  Flexion WFL  Extension WFL  Right lateral flexion WFL  Left lateral flexion WFL  Right rotation 25% limited  Left rotation 25% limited    (Blank rows=not tested)   GAIT:  Assessed: Yes Assistance needed: Independent  Ambulation Distance: 10 feet  Assistive Device: none Gait pattern: WFL Ambulation surface: Level  PATIENT EDUCATION:  Education details: Neck ROM, importance of posture when sitting, standing and lying down, deep breathing, walking program and importance of staying active throughout treatment, CURE article on staying active, "Why exercise?" flyer, lymphedema and PT info Person educated: Patient Education method: Explanation, Demonstration, Handout Education comprehension: Patient verbalized understanding and returned demonstration   HOME EXERCISE PROGRAM: Patient was instructed today in a home exercise program today for head and neck range of motion exercises. These included active cervical flexion, active cervical extension, active cervical rotation to each direction, upper trap stretch, and shoulder retraction. Patient was encouraged to do these 2-3 times a day, holding for 5 sec each and completing for 5 reps. Pt was educated that once this becomes easier then hold the stretches for 30-60 seconds.    ASSESSMENT:  CLINICAL IMPRESSION: Pt arrives to PT with recently diagnosed malignant neoplasm of supraglottis. Pt will undergo chemo and radiation. Pt will start treatment on 05/30/22 and complete on 07/19/22.  Pt's cervical ROM was [facility] for all directions except for bilateral rotation which was 25%. Educated pt about signs and symptoms of lymphedema as well as anatomy and physiology of lymphatic system. Educated pt in importance of staying as active as possible throughout treatment to  decrease fatigue as well as head and neck ROM exercises to decrease loss of ROM. Will see pt after completion of radiation to reassess ROM and assess for lymphedema and to determine therapy needs at that time.  Pt will benefit from skilled therapeutic intervention to improve on the following deficits: Decreased knowledge of precautions and postural dysfunction.   PT treatment/interventions: ADL/self-care home management, pt/family education, therapeutic exercise. Other interventions   REHAB POTENTIAL: Good  CLINICAL DECISION MAKING: Stable/uncomplicated  EVALUATION COMPLEXITY: Low   GOALS: Goals reviewed with patient? YES  LONG TERM GOALS: (STG=LTG)   Name Target Date  Goal status  1 Patient will be able to verbalize understanding of a home exercise program for cervical range of motion, posture, and walking.   Baseline:  No knowledge 05/24/2022 Achieved at eval  2 Patient will be able to verbalize understanding of proper sitting and standing posture. Baseline:  No  knowledge 05/24/2022 Achieved at eval  3 Patient will be able to verbalize understanding of lymphedema risk and availability of treatment for this condition Baseline:  No knowledge 05/24/2022 Achieved at eval  4 Pt will demonstrate a return to full cervical ROM and function post operatively compared to baselines and not demonstrate any signs or symptoms of lymphedema.  Baseline: See objective measurements taken today. 08/02/2022 New     PLAN: PT FREQUENCY/DURATION: EVAL and 1 follow up appointment.   PLAN FOR NEXT SESSION: will reassess 2 weeks after completion of radiation to determine needs.  Patient will follow up at outpatient cancer rehab 2 weeks after completion of radiation.  If the patient requires physical therapy at that time, a specific plan will be dictated and sent to the referring physician for approval. The patient was educated today on appropriate basic range of motion exercises to begin now and continue  throughout radiation and educated on the signs and symptoms of lymphedema. Patient verbalized good understanding.     Physical Therapy Information for During and After Head/Neck Cancer Treatment: Lymphedema is a swelling condition that you may be at risk for in your neck and/or face if you have radiation treatment to the area and/or if you have surgery that includes removing lymph nodes.  There is treatment available for this condition and it is not life-threatening.  Contact your physician or physical therapist with concerns. An excellent resource for those seeking information on lymphedema is the National Lymphedema Network's website.  It can be accessed at [place].org If you notice swelling in your neck or face at any time following surgery (even if it is many years from now), please contact your doctor or physical therapist to discuss this.  Lymphedema can be treated at any time but it is easier for you if it is treated early on. If you have had surgery to your neck, please check with your surgeon about how soon to start doing neck range of motion exercises.  If you are not having surgery, I encourage you to start doing neck range of motion exercises today and continue these while undergoing treatment, UNLESS you have irritation of your skin or soft tissue that is aggravated by doing them.  These exercises are intended to help you prevent loss of range of motion and/or to gain range of motion in your neck (which can be limited by tightening effects of radiation), and NOT to aggravate these tissues if they develop sensitivities from treatment. Neck range of motion exercises should be done to the point of feeling a GENTLE, TOLERABLE stretch only.  You are encouraged to start a walking or other exercise program tomorrow and continue this as much as you are able through and after treatment.  Please feel free to call me with any questions. Manus Gunning, PT, CLT Physical Therapist and  Certified Lymphedema Therapist [facility] [address], [place], [place] [address] [phone] [email or web address]  WALKING  Walking is a great form of exercise to increase your strength, endurance and overall fitness.  A walking program can help you start slowly and gradually build endurance as you go.  Everyone's ability is different, so each person's starting point will be different.  You do not have to follow them exactly.  The are just samples. You should simply find out what's right for you and stick to that program.   In the beginning, you'll start off walking 2-3 times a day for short distances.  As you get stronger,  you'll be walking further at just 1-2 times per day.  A. You Can Walk For A Certain Length Of Time Each Day    Walk 5 minutes 3 times per day.  Increase 2 minutes every 2 days (3 times per day).  Work up to 25-30 minutes (1-2 times per day).   Example:   Day 1-2 5 minutes 3 times per day   Day 7-8 12 minutes 2-3 times per day   Day 13-14 25 minutes 1-2 times per day  B. You Can Walk For a Certain Distance Each Day     Distance can be substituted for time.    Example:   3 trips to mailbox (at road)   3 trips to corner of block   3 trips around the block  C. Go to local high school and use the track.    Walk for distance ____ around track  Or time ____ minutes  D. Walk ____ Jog ____ Run ___   Why exercise?  So many benefits! Here are SOME of them: Heart health, including raising your good cholesterol level and reducing heart rate and blood pressure Lung health, including improved lung capacity It burns fats, and most of [place] can stand to be leaner, whether or not we are overweight. It increases the body's natural painkillers and mood elevators, so makes you feel better. Not only makes you feel better, but look better too Improves sleep Takes a bite out of stress May decrease your risk of many types of  cancer If you are currently undergoing cancer treatment, exercise may improve your ability to tolerate treatments including chemotherapy. For everybody, it can improve your energy level. Those with cancer-related fatigue report a 40-50% reduction in this symptom when exercising regularly. If you are a survivor of breast, colon, or prostate cancer, it may decrease your risk of a recurrence. (This may hold for other cancers too, but so far we have data just for these three types.)  How to exercise: Get your doctor's okay. Pick something you enjoy doing, like walking, Zumba, biking, swimming, or whatever. Start at low intensity and time, then gradually increase.  (See walking program handout.) Set a goal to achieve over time.  The American Cancer Society, American Heart Association, and U.S. Dept. of Health and Human Services recommend 150 minutes of moderate exercise, 75 minutes of vigorous exercise, or a combination of both per week. This should be done in episodes at least 10 minutes long, spread throughout the week.  Need help being motivated? Pick something you enjoy doing, because you'll be more inclined to stick with that activity than something that feels like a chore. Do it with a friend so that you are accountable to each other. Schedule it into your day. Place it on your calendar and keep that appointment just like you do any appointment that you make. Join an exercise group that meets at a specific time.  That way, you have to show up on time, and that makes it harder to procrastinate about doing your workout.  It also keeps you accountable--people begin to expect you to be there. Join a gym where you feel comfortable and not intimidated, at the right cost. Sign up for something that you'll need to be in shape for on a specific date, like a 1K or a 5K to walk or run, a 20 or 30 mile bike ride, a mud run or something like that. If the date is looming, you know you'll need to train  to be  ready for it.  An added benefit is that many of these are fundraisers for good causes. If you've already paid for a gym membership, group exercise class or event, you might as well work out, so you haven't wasted your money!    [employer], PT 05/24/2022, 10:21 AM

## 2022-05-23 NOTE — Progress Notes (Signed)
Has armband been applied?  Yes.    Does patient have an allergy to IV contrast dye?: No.   Has patient ever received premedication for IV contrast dye?: No.   Date of lab work: 05/08/2022 BUN: 12 CR: 0.86 eGFR: >60  Does patient take metformin?: No.  Is eGFR >60?: Yes.   If no, when can patient resume? (Must be 48 hrs AFTER they receive IV contrast):    IV site: antecubital right, condition patent and no redness  Has IV site been added to flowsheet?  Yes.    There were no vitals taken for this visit.

## 2022-05-23 NOTE — Progress Notes (Signed)
Oncology Nurse Navigator Documentation   To provide support, encouragement and care continuity, met with Cory [name] before his CT SIM.  He tolerated procedure without difficulty, denied questions/concerns.    He will be here tomorrow for Head and Neck MDC. He is aware that we are still waiting on his lymph node biopsy to determine the exact treatment plan.   He knows to call me if he has any questions.  Jennifer Malmfelt RN, BSN, OCN Head & Neck Oncology Nurse Navigator  [facility] Phone # [phone]  Fax # [phone]  

## 2022-05-24 ENCOUNTER — Telehealth: Payer: Self-pay | Admitting: Dietician

## 2022-05-24 ENCOUNTER — Other Ambulatory Visit: Payer: Self-pay

## 2022-05-24 ENCOUNTER — Ambulatory Visit: Payer: Medicare HMO | Admitting: Physical Therapy

## 2022-05-24 ENCOUNTER — Inpatient Hospital Stay: Payer: Medicare HMO | Admitting: Licensed Clinical Social Worker

## 2022-05-24 ENCOUNTER — Encounter: Payer: Self-pay | Admitting: Physical Therapy

## 2022-05-24 ENCOUNTER — Ambulatory Visit: Payer: Medicare HMO | Attending: Neurology

## 2022-05-24 DIAGNOSIS — R1310 Dysphagia, unspecified: Secondary | ICD-10-CM | POA: Diagnosis not present

## 2022-05-24 DIAGNOSIS — R293 Abnormal posture: Secondary | ICD-10-CM | POA: Diagnosis not present

## 2022-05-24 DIAGNOSIS — C321 Malignant neoplasm of supraglottis: Secondary | ICD-10-CM | POA: Insufficient documentation

## 2022-05-24 NOTE — Progress Notes (Signed)
Oncology Nurse Navigator Documentation   I met [name] before his appointments in Head and Neck clinic today. He is doing well and ready to start treatment. I told him that I would be in contact regarding his recent biopsy results and his upcoming schedule. He has my direct contact information to call for any needs.   [name] RN, BSN, OCN Head & Neck Oncology Nurse [place] [facility] Phone # [phone]  Fax # [phone]

## 2022-05-24 NOTE — Telephone Encounter (Signed)
Scheduled appt per 10/18 sch msg. Pt aware.

## 2022-05-24 NOTE — Therapy (Signed)
OUTPATIENT SPEECH LANGUAGE PATHOLOGY ONCOLOGY EVALUATION   Patient Name: Cory [name] MRN: [id number] DOB:1946-01-03, 76 y.o., male Today's Date: 05/24/2022  PCP: Carolann Littler, MD REFERRING PROVIDER: [name], MD   End of Session - 05/24/22 1317     Visit Number 1    Number of Visits 7    Date for SLP Re-Evaluation 08/22/22    SLP Start Time 31    SLP Stop Time  1100    SLP Time Calculation (min) 38 min    Activity Tolerance Patient tolerated treatment well             Past Medical History:  Diagnosis Date   Aortic stenosis    a. Moderate by echo 05/2020.   Arthritis    right hip   Cancer ([place])    skin cancer nose, right left, upper left basel cell   Dysrhythmia    a-fib   PAF (paroxysmal atrial fibrillation) (HCC)    Peripheral arterial disease (HCC)    Tobacco abuse    Past Surgical History:  Procedure Laterality Date   ABDOMINAL AORTIC ENDOVASCULAR STENT GRAFT N/A 08/29/2020   Procedure: ABDOMINAL AORTIC ENDOVASCULAR STENT GRAFT;  Surgeon: Elam Dutch, MD;  Location: [facility];  Service: Vascular;  Laterality: N/A;   ABDOMINAL AORTOGRAM W/LOWER EXTREMITY Bilateral 06/24/2020   Procedure: ABDOMINAL AORTOGRAM W/LOWER EXTREMITY;  Surgeon: Elam Dutch, MD;  Location: [facility];  Service: Cardiovascular;  Laterality: Bilateral;   BACK SURGERY     2005   CERVICAL SPINE SURGERY     DIRECT LARYNGOSCOPY Bilateral 04/18/2022   Procedure: DIRECT LARYNGOSCOPY WITH BIOPSIES;  Surgeon: Melida Quitter, MD;  Location: [facility];  Service: ENT;  Laterality: Bilateral;   EMBOLECTOMY Right 08/29/2020   Procedure: POPLITEAL AND TIBIAL EMBOLECTOMY;  Surgeon: Elam Dutch, MD;  Location: [facility];  Service: Vascular;  Laterality: Right;   EMBOLIZATION Right 06/24/2020   Procedure: EMBOLIZATION;  Surgeon: Elam Dutch, MD;  Location: [facility];  Service: Cardiovascular;  Laterality: Right;   ENDARTERECTOMY FEMORAL Right 08/29/2020   Procedure:  ENDARTERECTOMY FEMORAL;  Surgeon: Elam Dutch, MD;  Location: [facility];  Service: Vascular;  Laterality: Right;   EYE SURGERY     FEMORAL ARTERY EXPLORATION Right 06/25/2020   Procedure: RIGHT GROIN EXPLORATION Evacuation of Hematoma  WITH REPAIR OF RIGHT Common FEMORAL ARTERY.;  Surgeon: Angelia Mould, MD;  Location: [facility];  Service: Vascular;  Laterality: Right;   FEMORAL-POPLITEAL BYPASS GRAFT Left 11/22/2020   Procedure: LEFT ABOVE KNEE-BELOW KNEE BYPASS TIBIAL PERITONEAL TRUNK REVERSE IPSILATERAL GREATER SAPHENOUS VEIN;  Surgeon: Elam Dutch, MD;  Location: [facility];  Service: Vascular;  Laterality: Left;   LEG SURGERY     Trauma   LOWER EXTREMITY ANGIOGRAPHY Left 11/17/2020   Procedure: Lower Extremity Angiography;  Surgeon: Marty Heck, MD;  Location: [facility];  Service: Cardiovascular;  Laterality: Left;   PATCH ANGIOPLASTY Right 08/29/2020   Procedure: PATCH ANGIOPLASTY RIGHT BELOW KNEE POPLITEAL ARTERY AND RIGHT COMMON FEMORAL ARTERY;  Surgeon: Elam Dutch, MD;  Location: [facility];  Service: Vascular;  Laterality: Right;   RETINAL DETACHMENT SURGERY     RIGID ESOPHAGOSCOPY  04/18/2022   Procedure: RIGID ESOPHAGOSCOPY;  Surgeon: Melida Quitter, MD;  Location: [facility];  Service: ENT;;   TONSILLECTOMY  04/18/2022   Procedure: LEFT TONSILLECTOMY;  Surgeon: Melida Quitter, MD;  Location: [facility];  Service: ENT;;   ULTRASOUND GUIDANCE FOR VASCULAR ACCESS Bilateral  08/29/2020   Procedure: ULTRASOUND GUIDANCE FOR VASCULAR ACCESS;  Surgeon: Elam Dutch, MD;  Location: [facility];  Service: Vascular;  Laterality: Bilateral;   Patient Active Problem List   Diagnosis Date Noted   Malignant neoplasm of supraglottis ([place]) 05/08/2022   Cancer of supraglottis ([place]) 05/04/2022   Rib pain on left side 12/01/2021   Chronic sore throat 08/15/2021   Dyslipidemia 07/17/2021   AAA (abdominal aortic aneurysm) ([place]) 08/29/2020   PAD (peripheral artery disease) ([place]) 06/25/2020    Educated about COVID-19 virus infection 05/12/2020   Tobacco abuse 05/12/2020   Peripheral vascular disease ([place]) 04/25/2020   Abdominal aortic aneurysm (AAA) ([place]) 04/25/2020   Essential hypertension 05/16/2018   Medication management 05/16/2018   Piriformis syndrome of right side 05/06/2018   Allergy 10/29/2017   Recurrent sinusitis 10/29/2017   Atrial fibrillation, transient ([place]) 06/07/2015   Aortic stenosis    ABNORMAL ELECTROCARDIOGRAM 07/14/2009    ONSET DATE: See "pertinent history"   REFERRING DIAG: Malignant neoplasm of supraglottis, Squamous cell, Stage IVA   THERAPY DIAG:  Dysphagia, unspecified type  Rationale for Evaluation and Treatment Rehabilitation  SUBJECTIVE:   SUBJECTIVE STATEMENT: DEnies current dysphagia with POs. Pt accompanied by: self  PERTINENT HISTORY:  He presented to Dr. Redmond Baseman on 08/15/21 with c/o persistent left sided sore throat since August 2022. Laryngoscopy completed at this time was unremarkable. 11/08/21 CT neck revealed a sub-centimeter focus of mucosal hyperenhancement along the posterior aspect of the uvula, and a few tiny cystic appearing foci along the left glosso-tonsillar sulcus. CT also showed a nonspecific mildly enlarged right level 2 lymph node measuring 14 mm in the short axis, and multiple small calcific foci within the left palatine tonsil possibly reflective of post-inflammatory calcifications and/or tonsilloliths. 11/21/21 repeat Laryngoscopy by Dr. Redmond Baseman again was unremarkable. Due to persistent throat pain Dr. Redmond Baseman advised proceeding with a direct laryngoscopy and rigid esophagosopy with biopsies. 04/18/22 Left laryngeal biopsies showed invasive moderately differentiated SCC.  05/07/22 PET completed showing Asymmetric Hypermetabolism in the left oropharynx and involving the left epiglottis. Imaging features compatible with primary neoplasm. (I believe some of the oropharynx SUV uptake could be r/t abscess, recent biopsy),  Contralateral (right-sided) hypermetabolic level II cervical lymph nodes compatible with metastatic disease. No hypermetabolic lymphadenopathy in the left neck, and 1.2 cm short axis aortocaval lymph node in the upper abdomen is hypermetabolic. Metastatic disease is a concern. This lymph node has increased from 0.7 cm on a study from 2022. (I've messaged GI providers to see if this could be bx'd by endoscopy).  05/21/22 Biopsy of the lymph node in his abdomen  with CT simulation planned for 05/23/22. Treatment plan will depend on results of the biopsy on 10/16 but will most likely be weekly chemo and 35 fractions of radiation. Treatment plan:  He will receive 35 fractions of radiation and weekly chemotherapy to his larynx and bilateral neck.  He is scheduled to start on 05/30/22 and complete 07/19/22. PEG/PAC pending.  PAIN:  Are you having pain? No If swallows, soreness is in throat 4/10.  FALLS: Has patient fallen in last 6 months?  No  LIVING ENVIRONMENT: Lives with: lives with their spouse Lives in: House/apartment  PLOF:  Level of assistance: Independent with ADLs Employment: Retired  PATIENT GOALS: Maintain WNL swallowing  OBJECTIVE:   COGNITION: Overall cognitive status: Within functional limits for tasks assessed  LANGUAGE: Receptive and Expressive language appeared WNL  ORAL MOTOR EXAMINATION: Overall status: WFL  CLINICAL SWALLOW ASSESSMENT:  Current diet: regular and thin liquids Dentition:  dentures (upper and lower) Patient directly observed with POs: Yes: regular and thin liquids  Feeding: able to feed self Liquids provided by: cup Oral phase signs and symptoms:  None today Pharyngeal phase signs and symptoms:  None today  TODAY'S TREATMENT: Research states the risk for dysphagia increases due to radiation and/or chemotherapy treatment due to a variety of factors, so SLP educated the pt about the possibility of reduced/limited ability for PO intake during rad tx.  SLP also educated pt regarding possible changes to swallowing musculature after rad tx, and why adherence to dysphagia HEP provided today and PO consumption was necessary to inhibit muscle fibrosis following rad tx and to mitigate muscle disuse atrophy. SLP informed pt why this would be detrimental to their swallowing status and to their pulmonary health. Pt demonstrated understanding of these things to SLP. SLP encouraged pt to safely eat and drink as deep into their radiation/chemotherapy as possible to provide the best possible long-term swallowing outcome for pt.    SLP then developed an individualized HEP for pt involving oral and pharyngeal and vocal  strengthening and ROM and pt was instructed how to perform these exercises, including SLP demonstration. After SLP demonstration, pt return demonstrated each exercise. SLP ensured pt performance was correct prior to educating pt on next exercise. Pt required min cues faded to modified independent to perform HEP. Pt was instructed to complete this program 6-7 days/week, at least 2 times a day until 6 months after his or her last day of rad tx, and then x2 a week after that, indefinitely. Modifications for days when pt cannot perform HEP as prescribed include cycle through the full program of exercises instead of fatiguing on one of the swallowing exercises and being unable to perform the others. SLP instructed that swallowing exercises should then be increased back as pt is able to do so. Secondly, pt was told that former patients have told SLP that during their course of radiation therapy, taking prescribed pain medication just prior to performing HEP (and eating/drinking) has proven helpful in completing HEP (and eating and drinking) more regularly when going through their course of radiation treatment.   PATIENT EDUCATION: Education details: late effects head/neck radiation on swallow function, HEP procedure, and modification to HEP when difficulty  experienced with swallowing during and after radiation course Person educated: Patient Education method: Explanation, Demonstration, Verbal cues, and Handouts Education comprehension: verbalized understanding, returned demonstration, verbal cues required, and needs further education   ASSESSMENT:  CLINICAL IMPRESSION: Patient is a 76 y.o. male who was seen today for assessment of swallowing as they undergo radiation/chemoradiation therapy. Today pt ate items from regular diet and drank thin liquids. POs included [place] sandwich and drank thin liquids and were without overt s/s oral or pharyngeal difficulty. At this time pt swallowing is deemed WNL/WFL with these POs. No oral or overt s/sx pharyngeal deficits, including aspiration were observed. There are no overt s/s aspiration PNA observed by SLP nor any reported by pt at this time. Data indicate that pt's swallow ability will likely decrease over the course of radiation/chemoradiation therapy and could very well decline over time following the conclusion of that therapy due to muscle disuse atrophy and/or muscle fibrosis. Pt will cont to need to be seen by SLP in order to assess safety of PO intake, assess the need for recommending any objective swallow assessment, and ensuring pt is correctly completing the individualized HEP.  OBJECTIVE IMPAIRMENTS: include dysphagia.  These impairments are limiting patient from safety when swallowing. Factors affecting potential to achieve goals and functional outcome are  none . Patient will benefit from skilled SLP services to address above impairments and improve overall function.  REHAB POTENTIAL: Good    GOALS: Goals reviewed with patient? No   SHORT TERM GOALS: Target date:  2 therapy visits (visit #3)       pt will complete HEP with rare min A  Baseline: Goal status: Initial   2.  pt will tell SLP why pt is completing HEP with modified independence Baseline:  Goal status: Initial   3.  pt  will describe 3 overt s/s aspiration PNA with modified independence Baseline:  Goal status: Initial     LONG TERM GOALS: Target date:  6 therapy visits (visit #7)      pt will complete HEP with modified independnence in 2 sessions Baseline:  Goal status: Initial   2.  pt will describe how to modify HEP over time, and the timeline associated with reduction in HEP frequency with modified independence over two sessions Baseline:  Goal status: Initial     PLAN: SLP FREQUENCY:  once every approx 4 weeks   SLP DURATION:  7 total sessions   PLANNED INTERVENTIONS: Aspiration precaution training, Pharyngeal strengthening exercises, Diet toleration management , Trials of upgraded texture/liquids, Internal/external aids, SLP instruction and feedback, Compensatory strategies, and Patient/family education     [facility] [place] 05/24/2022, 1:18 PM

## 2022-05-24 NOTE — Patient Instructions (Signed)
SWALLOWING EXERCISES Do these until 6 months after your last day of radiation, then 2-3 times per week afterwards  Effortful Swallows - Press your tongue against the roof of your mouth for 3 seconds, then squeeze the muscles in your neck while you swallow your saliva or a sip of water - Repeat 10-15 times, 2-3 times a day, and use whenever you eat or drink  [place] Swallow - swallow with your tongue sticking out - Stick tongue out past your lips and gently bite tongue with your teeth - Swallow, while holding your tongue with your teeth - Repeat 10-15 times, 2-3 times a day *use a wet spoon if your mouth gets dry*  Shaker Exercise - head lift - Lie flat on your back in your bed or on a couch without pillows - Raise your head and look at your feet - KEEP YOUR SHOULDERS DOWN - HOLD FOR 45-60 SECONDS, then lower your head back down - Repeat 3 times, 2-3 times a day  [employer] - "half swallow" exercise - Start to swallow, and keep your Adam's apple up by squeezing hard with the muscles of the throat - Hold the squeeze for 5-7 seconds and then relax - Repeat 10-15 times, 2-3 times a day *use a wet spoon if your mouth gets dry*      5.  Siren exercise  - say "ooooo" at as low of a pitch as you can and glide up to the highest pitch you can  - repeat 10x, 2-3 times a day

## 2022-05-24 NOTE — Progress Notes (Signed)
[place] [facility] Work  Initial Assessment   Cory [name] "Cory [name]" is a 76 y.o. year old male presenting alone. Clinical Social Work was referred by  [facility] MDC/ nurse navigator  for assessment of psychosocial needs.   SDOH (Social Determinants of Health) assessments performed: Yes SDOH Interventions    Flowsheet Row Clinical Support from 05/24/2022 in [facility] from 12/13/2021 in [facility] at [place] from 06/07/2021 in [facility] at [place] Interventions Intervention Not Indicated -- Intervention Not Indicated  Housing Interventions Intervention Not Indicated -- Intervention Not Indicated  Transportation Interventions Intervention Not Indicated Intervention Not Indicated Intervention Not Indicated  Utilities Interventions Intervention Not Indicated -- --  Financial Strain Interventions Intervention Not Indicated Intervention Not Indicated Intervention Not Indicated  Physical Activity Interventions -- -- Intervention Not Indicated  Stress Interventions -- -- Intervention Not Indicated  Social Connections Interventions -- -- Intervention Not Indicated       SDOH Screenings   Food Insecurity: No Food Insecurity (05/24/2022)  Housing: Low Risk  (05/24/2022)  Transportation Needs: No Transportation Needs (05/24/2022)  Utilities: Not At Risk (05/24/2022)  Alcohol Screen: Medium Risk (01/08/2022)  Depression (PHQ2-9): Low Risk  (12/01/2021)  Financial Resource Strain: Low Risk  (05/24/2022)  Physical Activity: Insufficiently Active (01/08/2022)  Social Connections: Moderately Isolated (01/08/2022)  Stress: No Stress Concern Present (01/08/2022)  Tobacco Use: High Risk (05/24/2022)     Distress Screen completed: No     No data to display            Family/Social Information:  Housing Arrangement: patient lives with wife, Cory [name] Family members/support  persons in your life? Family- wife, kids, grandkids Transportation concerns: no  Employment: Retired .  Income source: retirement income Financial concerns: No Type of concern: None Food access concerns: no Religious or spiritual practice: Not known Services Currently in place:  n/a  Coping/ Adjustment to diagnosis: Patient understands treatment plan and what happens next? yes Concerns about diagnosis and/or treatment: I'm not especially worried about anything Patient reported stressors:  no major stressors noted Current coping skills/ strengths: Capable of independent living , Communication skills , Motivation for treatment/growth , and Supportive family/friends     SUMMARY: Current SDOH Barriers:  None noted today  Clinical Social Work Clinical Goal(s):  No clinical social work goals at this time  Interventions: Discussed common feeling and emotions when being diagnosed with cancer, and the importance of support during treatment Informed patient of the support team roles and support services at [facility] Provided [facility] contact information and encouraged patient to call with any questions or concerns   Follow Up Plan: Patient will contact CSW with any support or resource needs Patient verbalizes understanding of plan: Yes    Merik Mignano E Finnis Colee, LCSW

## 2022-05-25 ENCOUNTER — Other Ambulatory Visit: Payer: Self-pay | Admitting: Hematology and Oncology

## 2022-05-25 DIAGNOSIS — C321 Malignant neoplasm of supraglottis: Secondary | ICD-10-CM

## 2022-05-25 LAB — CYTOLOGY - NON PAP

## 2022-05-25 MED ORDER — DEXAMETHASONE 4 MG PO TABS: ORAL_TABLET | ORAL | 1 refills | Status: DC

## 2022-05-25 MED ORDER — LIDOCAINE-PRILOCAINE 2.5-2.5 % EX CREA: TOPICAL_CREAM | CUTANEOUS | 3 refills | Status: DC

## 2022-05-25 MED ORDER — ONDANSETRON HCL 8 MG PO TABS: 8.0000 mg | ORAL_TABLET | Freq: Three times a day (TID) | ORAL | 1 refills | Status: DC | PRN

## 2022-05-25 MED ORDER — PROCHLORPERAZINE MALEATE 10 MG PO TABS: 10.0000 mg | ORAL_TABLET | Freq: Four times a day (QID) | ORAL | 1 refills | Status: DC | PRN

## 2022-05-25 NOTE — Progress Notes (Signed)
I called the patient and discussed cytology findings. We will need another biopsy of the aortocaval LN. In the interim, we discussed about proceeding with chemoradiation for the supraglottic primary.  Cory [name]

## 2022-05-25 NOTE — Progress Notes (Signed)
START ON PATHWAY REGIMEN - Head and Neck     A cycle is every 7 days:     Cisplatin   **Always confirm dose/schedule in your pharmacy ordering system**  Patient Characteristics: Larynx, Preoperative or Nonsurgical Candidate, Stage III - IVB Disease Classification: Larynx AJCC T Category: T4 AJCC 8 Stage Grouping: Unknown Therapeutic Status: Preoperative or Nonsurgical Candidate AJCC N Category: cN1 AJCC M Category: M0 Intent of Therapy: Curative Intent, Discussed with Patient

## 2022-05-26 ENCOUNTER — Encounter (HOSPITAL_COMMUNITY): Payer: Self-pay | Admitting: Gastroenterology

## 2022-05-26 ENCOUNTER — Other Ambulatory Visit: Payer: Self-pay

## 2022-05-28 ENCOUNTER — Telehealth: Payer: Self-pay | Admitting: Hematology and Oncology

## 2022-05-28 NOTE — Telephone Encounter (Signed)
Scheduled appointments per WQ. Patient is aware. 

## 2022-05-29 ENCOUNTER — Telehealth (HOSPITAL_COMMUNITY): Payer: Self-pay

## 2022-05-29 ENCOUNTER — Other Ambulatory Visit: Payer: Self-pay

## 2022-05-29 ENCOUNTER — Telehealth: Payer: Self-pay | Admitting: Pharmacist

## 2022-05-29 ENCOUNTER — Telehealth: Payer: Self-pay | Admitting: Hematology and Oncology

## 2022-05-29 ENCOUNTER — Telehealth: Payer: Self-pay | Admitting: Cardiology

## 2022-05-29 DIAGNOSIS — C321 Malignant neoplasm of supraglottis: Secondary | ICD-10-CM | POA: Diagnosis not present

## 2022-05-29 DIAGNOSIS — Z510 Encounter for antineoplastic radiation therapy: Secondary | ICD-10-CM | POA: Diagnosis not present

## 2022-05-29 DIAGNOSIS — Z5111 Encounter for antineoplastic chemotherapy: Secondary | ICD-10-CM | POA: Diagnosis not present

## 2022-05-29 NOTE — Telephone Encounter (Signed)
-----   Message from Suzette Battiest, MD sent at 05/29/2022  9:20 AM EDT ----- Regarding: RE: peg/port Approved for both.  Marginal window for gastrostomy, but transverse colon like to displace inferiorly with gastric insufflation.  Please make sure pt gets contrast PO 1 day prior to procedure for visualization of transverse colon.  Dylan ----- Message ----- From: Danielle Dess Sent: 05/29/2022   9:13 AM EDT To: [facility] Procedure Requests Subject: peg/port                                       Procedure: Peg and port placement  Dx: malignant neoplasm of supraglottis   Ordering: Dr. Chryl Heck [phone]  Imaging: NM Pet in epic   Please review.   Thanks, Lia Foyer

## 2022-05-29 NOTE — Telephone Encounter (Addendum)
Called and spoke with Samara Deist- who states that patient has sent in forms multiple times to office but has not heard back regarding his Xarelto patient assistance.   Called and spoke with patient who states that he physically walked the paper work in the office and handed it to the front desk staff in an envelope with Dr. Rosezella [place] name on it. Advised patient that upon searching I can not find any paper work. Advised patient to please re fill this out while he is at the cancer center and see if they can fax them back over to the office. Patient aware and verbalized understanding.   Returned call to [place] and left message to fax forms once filled out over to the office at [phone].

## 2022-05-29 NOTE — Telephone Encounter (Signed)
[facility] called wanting to know if forms for patient assistance for Xarelto were received for this patient.  They say patient mailed them to [place] back in September, this was the second time he mailed them to [place].

## 2022-05-29 NOTE — Telephone Encounter (Signed)
Attempted to return call to [facility], left message to call back.

## 2022-05-29 NOTE — Telephone Encounter (Signed)
   [facility] returning call, she said, she wanted to know if we got the pr's pt assistance application form. She said, she can see on epic if it can be noted here if she unable to answer her phone

## 2022-05-29 NOTE — Telephone Encounter (Signed)
Contacted patient to scheduled appointments. Patient is aware of appointments that are scheduled.   

## 2022-05-29 NOTE — Chronic Care Management (AMB) (Unsigned)
Chronic Care Management Pharmacy Assistant   Name: Cory [name]  MRN: [id number] DOB: 1946-04-19  Reason for Encounter: Xarelto Patient Assistance Follow up   Recent office visits:  None  Recent consult visits:  05/23/22 Patient presented to [facility] for [place].  05/21/22 Patient presented to [facility] for ESOPHAGOGASTRODUODENOSCOPY (EGD) WITH PROPOFOL. Changed Xarelto.  05/17/22 Miguel Aschoff (Cardiology) - Patient presented for Pre-op Cardiovascular exam. Instructed to hold Xarelto 2 days prior to procedure.  05/08/22 Benay Pike, MD (Hematology & Oncology) - Patient presented for Malignant neoplasm of supraglottis. No medication changes.  05/08/22 [name] MD (Rad Oncology) - Patient presented for Cancer of supraglottis and other concerns. Prescribed Nicotine Patches.  05/07/22 Patient presented to [facility] for [place] Pet Scan.  05/04/22 Hessie Knows, MD (ENT) - Patient presented for Post op exam and other concerns. No medication changes.  04/12/22 Warren Lacy PO-C ( Internal Med) - Patient presented for Preoperative clearance and other concerns. No medication change  Hospital visits:  Medication Reconciliation was completed by comparing discharge summary, patient's EMR and Pharmacy list, and upon discussion with patient.  Patient presented to [facility] on 04/18/22 due to Direct Laryngoscopy with biopsies.Patient was present for 5 hours.  New?Medications Started at [facility] Discharge:?? -started  HYDROcodone-acetaminophen (HYCET)  Medication Changes at Hospital Discharge: none  Medications Discontinued at Hospital Discharge: none  Medications that remain the same after Hospital Discharge:??  -All other medications will remain the same.    Medications: Outpatient Encounter Medications as of 05/29/2022  Medication Sig Note   acetaminophen (TYLENOL) 500 MG tablet Take 500 mg  by mouth every 6 (six) hours as needed (for pain.).    cetirizine (ZYRTEC) 10 MG tablet Take 1 tablet (10 mg total) by mouth daily. (Patient taking differently: Take 10 mg by mouth daily as needed for allergies.)    cholecalciferol (VITAMIN D) 25 MCG (1000 UNIT) tablet Take 1,000 Units by mouth 2 (two) times daily.    dexamethasone (DECADRON) 4 MG tablet Take 2 tablets daily x 3 days starting the day after cisplatin chemotherapy. Take with food.    diltiazem (TIADYLT ER) 120 MG 24 hr capsule TAKE 1 CAPSULE EVERY DAY (SCHEDULE OFFICE VISIT FOR FUTURE REFILLS)    HYDROcodone-acetaminophen (HYCET) 7.5-325 mg/15 ml solution Take 15 mLs by mouth every 6 (six) hours as needed for moderate pain.    lidocaine-prilocaine (EMLA) cream Apply to affected area once    metoprolol tartrate (LOPRESSOR) 25 MG tablet Take 1 tablet (25 mg total) by mouth 2 (two) times daily as needed.    nicotine (NICODERM CQ - DOSED IN MG/24 HOURS) 14 mg/24hr patch Place 1 patch (14 mg total) onto the skin daily. Apply 21 mg patch daily x 6 wk, then '14mg'$  patch daily x 2 wk, then 7 mg patch daily x 2 wk 05/14/2022: Hasnt started   nicotine (NICODERM CQ - DOSED IN MG/24 HOURS) 21 mg/24hr patch Place 1 patch (21 mg total) onto the skin daily. Apply 21 mg patch daily x 6 wk, then '14mg'$  patch daily x 2 wk, then 7 mg patch daily x 2 wk 05/14/2022: Hasnt started   nicotine (NICODERM CQ - DOSED IN MG/24 HR) 7 mg/24hr patch Place 1 patch (7 mg total) onto the skin daily. Apply 21 mg patch daily x 6 wk, then '14mg'$  patch daily x 2 wk, then 7 mg patch daily x 2 wk 05/14/2022:  Hasnt started   omeprazole (PRILOSEC) 40 MG capsule Take 1 capsule (40 mg total) by mouth daily.    ondansetron (ZOFRAN) 8 MG tablet Take 1 tablet (8 mg total) by mouth every 8 (eight) hours as needed for nausea or vomiting. Start on the third day after cisplatin.    prochlorperazine (COMPAZINE) 10 MG tablet Take 1 tablet (10 mg total) by mouth every 6 (six) hours as needed (Nausea  or vomiting).    rivaroxaban (XARELTO) 20 MG TABS tablet Take 1 tablet (20 mg total) by mouth daily with supper.    rosuvastatin (CRESTOR) 10 MG tablet Take 1 tablet (10 mg total) by mouth daily.    sodium chloride (OCEAN) 0.65 % SOLN nasal spray Place 1 spray into both nostrils as needed for congestion.    No facility-administered encounter medications on file as of 05/29/2022.   Notes: Received phone call from patient he reports after mailing his second application for patient assistance to Dr [place] office for [place] in September he has still not heard back from office or manufacturer. Call over to [place] to check status they report no application is on file for this patient. Call to Dr Hocherein's office to confirm receipt of application was advised a nurse would call me back.  Returned call to Dr [facility] nurse did not reach re-emphasized that I was calling to confirm receipt of application for assistance in second return message.  Spoke to [employer] she reports they are unable to locate pt's application for assistance at this time, advised patient I would mail him a new one since he does not have any upcoming appointments in the office soon and that he may return to [place] attention Jeni Salles so that we can fax it over to Dr [facility] office on his behalf and also scan in a completed copy for his chart, he was in agreement.  Care Gaps: Hepatitis C Screening - Overdue Zoster Vaccine - Overdue TDAP - Overdue Flu Vaccine - Overdue Lung Cancer Screen - Overdue CCM -11/23 BP- 146/88 05/21/22  Star Rating Drugs: Rosuvastatin 10 mg - Last filled 04/29/22 90 DS at [place]   Patient Assistance: [name] - Xarelto 2023 in process  [place] Pharmacist Assistant [phone]

## 2022-05-30 ENCOUNTER — Other Ambulatory Visit: Payer: Self-pay

## 2022-05-30 ENCOUNTER — Ambulatory Visit
Admission: RE | Admit: 2022-05-30 | Discharge: 2022-05-30 | Disposition: A | Discharge status: Home / Self Care | Payer: Medicare HMO | Source: Ambulatory Visit | Attending: Radiation Oncology | Admitting: Radiation Oncology

## 2022-05-30 ENCOUNTER — Inpatient Hospital Stay: Payer: Medicare HMO

## 2022-05-30 ENCOUNTER — Telehealth: Payer: Self-pay | Admitting: Dietician

## 2022-05-30 DIAGNOSIS — Z510 Encounter for antineoplastic radiation therapy: Secondary | ICD-10-CM | POA: Diagnosis not present

## 2022-05-30 DIAGNOSIS — C321 Malignant neoplasm of supraglottis: Secondary | ICD-10-CM | POA: Diagnosis not present

## 2022-05-30 DIAGNOSIS — Z5111 Encounter for antineoplastic chemotherapy: Secondary | ICD-10-CM | POA: Diagnosis not present

## 2022-05-30 LAB — RAD ONC ARIA SESSION SUMMARY
Course Elapsed Days: 0
Plan Fractions Treated to Date: 1
Plan Prescribed Dose Per Fraction: 2 Gy
Plan Total Fractions Prescribed: 35
Plan Total Prescribed Dose: 70 Gy
Reference Point Dosage Given to Date: 2 Gy
Reference Point Session Dosage Given: 2 Gy
Session Number: 1

## 2022-05-30 NOTE — Telephone Encounter (Signed)
R/s pt's nutrition appt per 10/25 secure chat from [facility]. Called pt, no answer. Left msg with updated appt date/time.

## 2022-05-30 NOTE — Progress Notes (Signed)
Oncology Nurse Navigator Documentation   To provide support, encouragement and care continuity, met with Cory [name] after his initial RT.   Mr. Cassis completed treatment without difficulty, denied questions/concerns. I went over his upcoming schedule with him. I plan to meet with him on Friday during infusion to provide PEG education.  I reviewed the registration/arrival procedure for subsequent treatments. I encouraged him to call me with questions/concerns as tmts proceed.   [name] RN, BSN, OCN Head & Neck Oncology Nurse [place] at [facility] [address] Phone # [phone]  Fax # [phone]

## 2022-05-31 ENCOUNTER — Inpatient Hospital Stay: Payer: Medicare HMO | Admitting: Dietician

## 2022-05-31 ENCOUNTER — Inpatient Hospital Stay (HOSPITAL_BASED_OUTPATIENT_CLINIC_OR_DEPARTMENT_OTHER): Payer: Medicare HMO | Admitting: Hematology and Oncology

## 2022-05-31 ENCOUNTER — Telehealth: Payer: Self-pay | Admitting: Gastroenterology

## 2022-05-31 ENCOUNTER — Inpatient Hospital Stay: Payer: Medicare HMO

## 2022-05-31 ENCOUNTER — Other Ambulatory Visit: Payer: Self-pay

## 2022-05-31 ENCOUNTER — Encounter: Payer: Self-pay | Admitting: Hematology and Oncology

## 2022-05-31 ENCOUNTER — Ambulatory Visit
Admission: RE | Admit: 2022-05-31 | Discharge: 2022-05-31 | Disposition: A | Discharge status: Home / Self Care | Payer: Medicare HMO | Source: Ambulatory Visit | Attending: Radiation Oncology | Admitting: Radiation Oncology

## 2022-05-31 VITALS — BP 129/80 | HR 70 | Temp 98.4°F | Resp 16 | Ht 76.0 in | Wt 220.9 lb

## 2022-05-31 DIAGNOSIS — C321 Malignant neoplasm of supraglottis: Secondary | ICD-10-CM | POA: Diagnosis not present

## 2022-05-31 DIAGNOSIS — Z510 Encounter for antineoplastic radiation therapy: Secondary | ICD-10-CM | POA: Diagnosis not present

## 2022-05-31 DIAGNOSIS — R599 Enlarged lymph nodes, unspecified: Secondary | ICD-10-CM

## 2022-05-31 DIAGNOSIS — Z5111 Encounter for antineoplastic chemotherapy: Secondary | ICD-10-CM | POA: Diagnosis not present

## 2022-05-31 LAB — CBC WITH DIFFERENTIAL (CANCER CENTER ONLY)
Abs Immature Granulocytes: 0.03 10*3/uL (ref 0.00–0.07)
Basophils Absolute: 0.1 10*3/uL (ref 0.0–0.1)
Basophils Relative: 1 %
Eosinophils Absolute: 0.2 10*3/uL (ref 0.0–0.5)
Eosinophils Relative: 2 %
HCT: 44.6 % (ref 39.0–52.0)
Hemoglobin: 15.3 g/dL (ref 13.0–17.0)
Immature Granulocytes: 0 %
Lymphocytes Relative: 26 %
Lymphs Abs: 2.2 10*3/uL (ref 0.7–4.0)
MCH: 32.4 pg (ref 26.0–34.0)
MCHC: 34.3 g/dL (ref 30.0–36.0)
MCV: 94.5 fL (ref 80.0–100.0)
Monocytes Absolute: 0.8 10*3/uL (ref 0.1–1.0)
Monocytes Relative: 10 %
Neutro Abs: 5.2 10*3/uL (ref 1.7–7.7)
Neutrophils Relative %: 61 %
Platelet Count: 203 10*3/uL (ref 150–400)
RBC: 4.72 MIL/uL (ref 4.22–5.81)
RDW: 13 % (ref 11.5–15.5)
WBC Count: 8.5 10*3/uL (ref 4.0–10.5)
nRBC: 0 % (ref 0.0–0.2)

## 2022-05-31 LAB — BASIC METABOLIC PANEL - CANCER CENTER ONLY
Anion gap: 5 (ref 5–15)
BUN: 18 mg/dL (ref 8–23)
CO2: 26 mmol/L (ref 22–32)
Calcium: 8.5 mg/dL — ABNORMAL LOW (ref 8.9–10.3)
Chloride: 106 mmol/L (ref 98–111)
Creatinine: 1.03 mg/dL (ref 0.61–1.24)
GFR, Estimated: >60 mL/min (ref >60)
Glucose, Bld: 96 mg/dL (ref 70–99)
Potassium: 4.1 mmol/L (ref 3.5–5.1)
Sodium: 137 mmol/L (ref 135–145)

## 2022-05-31 LAB — RAD ONC ARIA SESSION SUMMARY
Course Elapsed Days: 1
Plan Fractions Treated to Date: 2
Plan Prescribed Dose Per Fraction: 2 Gy
Plan Total Fractions Prescribed: 35
Plan Total Prescribed Dose: 70 Gy
Reference Point Dosage Given to Date: 4 Gy
Reference Point Session Dosage Given: 2 Gy
Session Number: 2

## 2022-05-31 LAB — MAGNESIUM: Magnesium: 2 mg/dL (ref 1.7–2.4)

## 2022-05-31 MED FILL — Dexamethasone Sodium Phosphate Inj 100 MG/10ML: INTRAMUSCULAR | Qty: 1 | Status: AC

## 2022-05-31 MED FILL — Fosaprepitant Dimeglumine For IV Infusion 150 MG (Base Eq): INTRAVENOUS | Qty: 5 | Status: AC

## 2022-05-31 NOTE — Telephone Encounter (Signed)
See alternate note  

## 2022-05-31 NOTE — Telephone Encounter (Signed)
Patient is returning your call.  

## 2022-05-31 NOTE — Progress Notes (Signed)
[name] CONSULT NOTE  Patient Care Team: [name], MD as PCP - General [name], MD as PCP - Cardiology (Cardiology) Viona Gilmore, [facility] as Pharmacist (Pharmacist) Malmfelt, Stephani Police, RN as Oncology Nurse Navigator [name], MD as Consulting Physician (Radiation Oncology) Benay Pike, MD as Consulting Physician (Hematology and Oncology)  CHIEF COMPLAINTS/PURPOSE OF CONSULTATION:  SCC supraglottis  ASSESSMENT & PLAN:  Malignant neoplasm of supraglottis [facility]) This is a very pleasant 76 year old male patient with history of smoking now 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   Further investigation of the aortocaval lymph node showed atypical lymphocytes suggestive of lymphoproliferative disorder.  However upon further review, it appears that the lymph node that was biopsied is not the lymph node that was desired to be biopsied.although this was abnormal.  We have requested a second biopsy for better characterization of the diagnosis.  Okay to proceed with cisplatin tomorrow, labs reviewed and satisfactory.  We will review pathology results from the biopsy scheduled for later November and discussed about treatment plan for lymphoma if this is indeed the diagnosis.  He currently remains asymptomatic from the possible lymphoma standpoint.  He understands adverse effects and the plan so far.  Return to clinic in 1 week or sooner as needed.     HISTORY OF PRESENTING ILLNESS:  Cory [name] 76 y.o. male is here because of cancer of supraglottis  This is a 76 year old male patient who initially presented to ENT with chronic left-sided sore throat for almost a year.  Initial evaluation did not show any possible causes for sore throat.  He was  recommended PPI twice daily but went back to ENT with persistent sore throat he once again had a repeat fiberoptic exam which was stable.  CT of the neck was however ordered.  CT neck done on November 08, 2021 did show subcentimeter focus of mucosal hyperenhancement along the posterior aspect of the uvula  laryngoscopy with direct visualization recommended to exclude a mucosal lesion at the site.  Additionally few tiny cystic-appearing foci along the left glossotonsillar sulcus.  Direct visualization recommended.  Nonspecific mildly enlarged right level 2 lymph node.  Multiple small calcific foci in the left palatine tonsil. Repeat fiberoptic exam was unremarkable hence Dr. 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  Tongue biopsy showed benign squamous mucosa with low to intermediate grade dysplasia.  PET/CT showed asymmetric hypermetabolism in the left oropharynx and involving the left epiglottis.  Contralateral hypermetabolic level 2 cervical lymph nodes compatible with metastatic disease.  No hypermetabolic adenopathy in the left neck or in the chest.  1.2 cm short axis aortocaval lymph node in the upper abdomen is hypermetabolic metastatic disease is a concern.  This lymph node has increased from 0.7 cm on the study from 2022.  No other metastatic disease.   He is anticipated to start chemoradiation tomorrow and he is here for follow-up before planned cycle 1 of chemotherapy He is complaining of ongoing sore throat, able to eat everything, no weight loss. Pain radiates to the left ear. No other complaints.  He understands that the lymph node biopsy showed some atypical lymphocytes and will need further investigation.  Rest of the pertinent  10 point ROS reviewed and negative  MEDICAL HISTORY:  Past Medical History:   Diagnosis Date   Aortic stenosis    a. Moderate by echo 05/2020.   Arthritis    right hip   Cancer ([place])    skin cancer nose, right left, upper left basel cell   Dysrhythmia    a-fib   PAF (paroxysmal atrial fibrillation) (HCC)    Peripheral arterial disease (HCC)    Tobacco abuse     SURGICAL HISTORY: Past Surgical History:  Procedure Laterality Date   ABDOMINAL AORTIC ENDOVASCULAR STENT GRAFT N/A 08/29/2020   Procedure: ABDOMINAL AORTIC ENDOVASCULAR STENT GRAFT;  Surgeon: Elam Dutch, MD;  Location: [place];  Service: Vascular;  Laterality: N/A;   ABDOMINAL AORTOGRAM W/LOWER EXTREMITY Bilateral 06/24/2020   Procedure: ABDOMINAL AORTOGRAM W/LOWER EXTREMITY;  Surgeon: Elam Dutch, MD;  Location: [place] [facility];  Service: Cardiovascular;  Laterality: Bilateral;   BACK SURGERY     2005   BIOPSY  05/21/2022   Procedure: BIOPSY;  Surgeon: Rush Landmark Telford Nab., MD;  Location: [facility];  Service: Gastroenterology;;   CERVICAL SPINE SURGERY     DIRECT LARYNGOSCOPY Bilateral 04/18/2022   Procedure: DIRECT LARYNGOSCOPY WITH BIOPSIES;  Surgeon: Melida Quitter, MD;  Location: [place];  Service: ENT;  Laterality: Bilateral;   EMBOLECTOMY Right 08/29/2020   Procedure: POPLITEAL AND TIBIAL EMBOLECTOMY;  Surgeon: Elam Dutch, MD;  Location: [place];  Service: Vascular;  Laterality: Right;   EMBOLIZATION Right 06/24/2020   Procedure: EMBOLIZATION;  Surgeon: Elam Dutch, MD;  Location: [place] [facility];  Service: Cardiovascular;  Laterality: Right;   ENDARTERECTOMY FEMORAL Right 08/29/2020   Procedure: ENDARTERECTOMY FEMORAL;  Surgeon: Elam Dutch, MD;  Location: [facility] OR;  Service: Vascular;  Laterality: Right;   ESOPHAGOGASTRODUODENOSCOPY (EGD) WITH PROPOFOL N/A 05/21/2022   Procedure: ESOPHAGOGASTRODUODENOSCOPY (EGD) WITH PROPOFOL;  Surgeon: Irving Copas., MD;  Location: [facility];  Service: Gastroenterology;  Laterality: N/A;   EUS N/A 05/21/2022    Procedure: UPPER ENDOSCOPIC ULTRASOUND (EUS) RADIAL;  Surgeon: Irving Copas., MD;  Location: [facility];  Service: Gastroenterology;  Laterality: N/A;   EYE SURGERY     FEMORAL ARTERY EXPLORATION Right 06/25/2020   Procedure: RIGHT GROIN EXPLORATION Evacuation of Hematoma  WITH REPAIR OF RIGHT Common FEMORAL ARTERY.;  Surgeon: Angelia Mould, MD;  Location: [place];  Service: Vascular;  Laterality: Right;   FEMORAL-POPLITEAL BYPASS GRAFT Left 11/22/2020   Procedure: LEFT ABOVE KNEE-BELOW KNEE BYPASS TIBIAL PERITONEAL TRUNK REVERSE IPSILATERAL GREATER SAPHENOUS VEIN;  Surgeon: Elam Dutch, MD;  [facility];  Service: Vascular;  Laterality: Left;   FINE NEEDLE ASPIRATION N/A 05/21/2022   Procedure: FINE NEEDLE ASPIRATION (FNA) LINEAR;  Surgeon: Irving Copas., MD;  Location: [facility];  Service: Gastroenterology;  Laterality: N/A;   LEG SURGERY     Trauma   LOWER EXTREMITY ANGIOGRAPHY Left 11/17/2020   Procedure: Lower Extremity Angiography;  Surgeon: Marty Heck, MD;  Location: [place] [facility];  Service: Cardiovascular;  Laterality: Left;   PATCH ANGIOPLASTY Right 08/29/2020   Procedure: PATCH ANGIOPLASTY RIGHT BELOW KNEE POPLITEAL ARTERY AND RIGHT COMMON FEMORAL ARTERY;  Surgeon: Elam Dutch, MD;  Location: [place];  Service: Vascular;  Laterality: Right;   RETINAL DETACHMENT SURGERY     RIGID ESOPHAGOSCOPY  04/18/2022   Procedure: RIGID ESOPHAGOSCOPY;  Surgeon: Melida Quitter, MD;  Location: [place];  Service: ENT;;   TONSILLECTOMY  04/18/2022   Procedure: LEFT TONSILLECTOMY;  Surgeon: Melida Quitter, MD;  Location: [place];  Service: ENT;;   ULTRASOUND GUIDANCE FOR VASCULAR ACCESS Bilateral 08/29/2020   Procedure: ULTRASOUND GUIDANCE FOR VASCULAR ACCESS;  Surgeon: Elam Dutch, MD;  Location: [facility] OR;  Service: Vascular;  Laterality: Bilateral;    SOCIAL HISTORY: Social History   Socioeconomic History   Marital status: Married    Spouse  name: Not on file   Number of children: 5   Years of education: Not on file   Highest education level: Master's degree (e.g., MA, MS, MEng, MEd, MSW, MBA)  Occupational History   Not on file  Tobacco Use   Smoking status: Every Day    Packs/day: 0.50    Years: 50.00    Total pack years: 25.00    Types: Cigarettes    Passive exposure: Never   Smokeless tobacco: Never  Vaping Use   Vaping Use: Never used  Substance and Sexual Activity   Alcohol use: Yes    Alcohol/week: 0.0 standard drinks of alcohol    Comment: 4 to 5 times a week; beer, wine and liquor    Drug use: No   Sexual activity: Not Currently  Other Topics Concern   Not on file  Social History Narrative   Scores reading tests.  Lives with wife.     Social Determinants of Health   Financial Resource Strain: Low Risk  (05/24/2022)   Overall Financial Resource Strain (CARDIA)    Difficulty of Paying Living Expenses: Not hard at all  Food Insecurity: No Food Insecurity (05/24/2022)   Hunger Vital Sign    Worried About Running Out of Food in the Last Year: Never true    Ran Out of Food in the Last Year: Never true  Transportation Needs: No Transportation Needs (05/24/2022)   PRAPARE - Hydrologist (Medical): No    Lack of Transportation (Non-Medical): No  Physical Activity: Insufficiently Active (01/08/2022)   Exercise Vital Sign    Days of Exercise per Week: 3 days    Minutes of Exercise per Session: 30 min  Stress: No Stress Concern Present (01/08/2022)   [place]    Feeling of Stress : Not at all  Social Connections: Moderately Isolated (01/08/2022)   Social Connection and Isolation Panel [NHANES]    Frequency of Communication with Friends and Family: More than three times a week    Frequency of Social Gatherings with Friends and Family: Three times a week    Attends Religious Services: Never    Active Member of Clubs or  Organizations: No    Attends Archivist Meetings: Never    Marital Status: Married  Human resources officer Violence: Not At Risk (06/07/2021)   Humiliation, Afraid, Rape, and Kick questionnaire    Fear of Current or Ex-Partner: No    Emotionally Abused: No    Physically Abused: No    Sexually Abused: No    FAMILY HISTORY: Family History  Problem Relation Age of Onset   Ovarian cancer Mother    Alcoholism Father    Breast cancer Sister    Ovarian cancer Sister     ALLERGIES:  is allergic to zithromax [azithromycin].  MEDICATIONS:  Current Outpatient Medications  Medication Sig Dispense Refill   acetaminophen (TYLENOL) 500 MG tablet Take 500 mg by mouth every 6 (six) hours as needed (for pain.).     cetirizine (ZYRTEC) 10 MG tablet Take 1 tablet (10 mg total) by  mouth daily. (Patient taking differently: Take 10 mg by mouth daily as needed for allergies.) 30 tablet 11   cholecalciferol (VITAMIN D) 25 MCG (1000 UNIT) tablet Take 1,000 Units by mouth 2 (two) times daily.     dexamethasone (DECADRON) 4 MG tablet Take 2 tablets daily x 3 days starting the day after cisplatin chemotherapy. Take with food. 30 tablet 1   diltiazem (TIADYLT ER) 120 MG 24 hr capsule TAKE 1 CAPSULE EVERY DAY (SCHEDULE OFFICE VISIT FOR FUTURE REFILLS) 90 capsule 1   HYDROcodone-acetaminophen (HYCET) 7.5-325 mg/15 ml solution Take 15 mLs by mouth every 6 (six) hours as needed for moderate pain. 250 mL 0   lidocaine-prilocaine (EMLA) cream Apply to affected area once 30 g 3   metoprolol tartrate (LOPRESSOR) 25 MG tablet Take 1 tablet (25 mg total) by mouth 2 (two) times daily as needed. 30 tablet 1   nicotine (NICODERM CQ - DOSED IN MG/24 HOURS) 14 mg/24hr patch Place 1 patch (14 mg total) onto the skin daily. Apply 21 mg patch daily x 6 wk, then '14mg'$  patch daily x 2 wk, then 7 mg patch daily x 2 wk 14 patch 0   nicotine (NICODERM CQ - DOSED IN MG/24 HOURS) 21 mg/24hr patch Place 1 patch (21 mg total) onto the  skin daily. Apply 21 mg patch daily x 6 wk, then '14mg'$  patch daily x 2 wk, then 7 mg patch daily x 2 wk 14 patch 2   nicotine (NICODERM CQ - DOSED IN MG/24 HR) 7 mg/24hr patch Place 1 patch (7 mg total) onto the skin daily. Apply 21 mg patch daily x 6 wk, then '14mg'$  patch daily x 2 wk, then 7 mg patch daily x 2 wk 14 patch 0   omeprazole (PRILOSEC) 40 MG capsule Take 1 capsule (40 mg total) by mouth daily. 30 capsule 6   ondansetron (ZOFRAN) 8 MG tablet Take 1 tablet (8 mg total) by mouth every 8 (eight) hours as needed for nausea or vomiting. Start on the third day after cisplatin. 30 tablet 1   prochlorperazine (COMPAZINE) 10 MG tablet Take 1 tablet (10 mg total) by mouth every 6 (six) hours as needed (Nausea or vomiting). 30 tablet 1   rivaroxaban (XARELTO) 20 MG TABS tablet Take 1 tablet (20 mg total) by mouth daily with supper. 90 tablet 1   rosuvastatin (CRESTOR) 10 MG tablet Take 1 tablet (10 mg total) by mouth daily. 90 tablet 1   sodium chloride (OCEAN) 0.65 % SOLN nasal spray Place 1 spray into both nostrils as needed for congestion.     No current facility-administered medications for this visit.     PHYSICAL EXAMINATION: ECOG PERFORMANCE STATUS: 1 - Symptomatic but completely ambulatory  Vitals:   05/31/22 1445  BP: 129/80  Pulse: 70  Resp: 16  Temp: 98.4 F (36.9 C)  SpO2: 95%   Filed Weights   05/31/22 1445  Weight: 220 lb 14.4 oz (100.2 kg)    GENERAL:alert, no distress and comfortable SKIN: skin color, texture, turgor are normal, no rashes or significant lesions EYES: normal, conjunctiva are pink and non-injected, sclera clear OROPHARYNX:no exudate, no erythema and lips, buccal mucosa, and tongue normal  NECK: supple, thyroid normal size, non-tender, without nodularity LYMPH: Palpable left cervical lymph adenopathy LUNGS: clear to auscultation and percussion with normal breathing effort HEART: regular rate & rhythm and no murmurs and no lower extremity  edema ABDOMEN:abdomen soft, non-tender and normal bowel sounds Musculoskeletal:no cyanosis of digits and no  clubbing  PSYCH: alert & oriented x 3 with fluent speech NEURO: no focal motor/sensory deficits  LABORATORY DATA:  I have reviewed the data as listed Lab Results  Component Value Date   WBC 8.5 05/31/2022   HGB 15.3 05/31/2022   HCT 44.6 05/31/2022   MCV 94.5 05/31/2022   PLT 203 05/31/2022     Chemistry      Component Value Date/Time   NA 137 05/31/2022 1420   NA 141 05/16/2018 1425   K 4.1 05/31/2022 1420   CL 106 05/31/2022 1420   CO2 26 05/31/2022 1420   BUN 18 05/31/2022 1420   BUN 13 05/16/2018 1425   CREATININE 1.03 05/31/2022 1420      Component Value Date/Time   CALCIUM 8.5 (L) 05/31/2022 1420   ALKPHOS 39 05/08/2022 1126   AST 14 (L) 05/08/2022 1126   ALT 17 05/08/2022 1126   BILITOT 0.4 05/08/2022 1126       RADIOGRAPHIC STUDIES: I have personally reviewed the radiological images as listed and agreed with the findings in the report. ECHOCARDIOGRAM COMPLETE  Result Date: 05/15/2022    ECHOCARDIOGRAM REPORT   Patient Name:   Cory [name] Date of Exam: 05/15/2022 Medical Rec #:  [id number]     Height:       76.0 in Accession #:    [id number]    Weight:       214.3 lb Date of Birth:  1946/04/04     BSA:          2.281 m Patient Age:    76 years      BP:           131/87 mmHg Patient Gender: M             HR:           63 bpm. Exam Location:  [place] Procedure: 2D Echo, Cardiac Doppler and Color Doppler Indications:    I35.0 AS  History:        Patient has prior history of Echocardiogram examinations, most                 recent 05/31/2020. Dilated aortic root, AS, Arrythmias:Atrial                 Fibrillation; Risk Factors:Current Smoker.  Sonographer:    Coralyn Helling RDCS Referring Phys: [place]  1. Left ventricular ejection fraction, by estimation, is 60 to 65%. The left ventricle has normal function. The left ventricle has  no regional wall motion abnormalities. There is mild left ventricular hypertrophy. Left ventricular diastolic parameters are consistent with Grade I diastolic dysfunction (impaired relaxation). Elevated left atrial pressure.  2. Right ventricular systolic function is normal. The right ventricular size is normal.  3. The mitral valve is normal in structure. No evidence of mitral valve regurgitation. No evidence of mitral stenosis. Moderate mitral annular calcification.  4. The aortic valve is calcified. Aortic valve regurgitation is mild. Moderate aortic valve stenosis.  5. Aortic dilatation noted. There is mild dilatation of the aortic root, measuring 43 mm. There is moderate dilatation of the ascending aorta, measuring 45 mm. FINDINGS  Left Ventricle: Left ventricular ejection fraction, by estimation, is 60 to 65%. The left ventricle has normal function. The left ventricle has no regional wall motion abnormalities. The left ventricular internal cavity size was normal in size. There is  mild left ventricular hypertrophy. Left ventricular diastolic parameters are consistent with Grade I diastolic dysfunction (  impaired relaxation). Elevated left atrial pressure. Right Ventricle: The right ventricular size is normal. Right ventricular systolic function is normal. Left Atrium: Left atrial size was normal in size. Right Atrium: Right atrial size was normal in size. Pericardium: There is no evidence of pericardial effusion. Mitral Valve: The mitral valve is normal in structure. Moderate mitral annular calcification. No evidence of mitral valve regurgitation. No evidence of mitral valve stenosis. Tricuspid Valve: The tricuspid valve is normal in structure. Tricuspid valve regurgitation is trivial. No evidence of tricuspid stenosis. Aortic Valve: The aortic valve is calcified. Aortic valve regurgitation is mild. Aortic regurgitation PHT measures 489 msec. Moderate aortic stenosis is present. Aortic valve mean gradient  measures 22.5 mmHg. Aortic valve peak gradient measures 44.6 mmHg. Aortic valve area, by VTI measures 2.93 cm. Pulmonic Valve: The pulmonic valve was not well visualized. Pulmonic valve regurgitation is not visualized. No evidence of pulmonic stenosis. Aorta: Aortic dilatation noted. There is mild dilatation of the aortic root, measuring 43 mm. There is moderate dilatation of the ascending aorta, measuring 45 mm. Venous: The inferior vena cava was not well visualized. IAS/Shunts: The interatrial septum was not well visualized.  LEFT VENTRICLE PLAX 2D LVIDd:         3.60 cm   Diastology LVIDs:         2.50 cm   LV e' medial:    6.42 cm/s LV PW:         1.40 cm   LV E/e' medial:  20.2 LV IVS:        1.50 cm   LV e' lateral:   10.10 cm/s LVOT diam:     2.80 cm   LV E/e' lateral: 12.9 LV SV:         203 LV SV Index:   89 LVOT Area:     6.16 cm  RIGHT VENTRICLE RV S prime:     20.70 cm/s TAPSE (M-mode): 2.2 cm RVSP:           36.6 mmHg LEFT ATRIUM             Index        RIGHT ATRIUM           Index LA diam:        3.40 cm 1.49 cm/m   RA Pressure: 3.00 mmHg LA Vol (A2C):   79.2 ml 34.72 ml/m  RA Area:     16.80 cm LA Vol (A4C):   44.0 ml 19.29 ml/m  RA Volume:   46.70 ml  20.47 ml/m LA Biplane Vol: 62.4 ml 27.35 ml/m  AORTIC VALVE AV Area (Vmax):    2.69 cm AV Area (Vmean):   2.84 cm AV Area (VTI):     2.93 cm AV Vmax:           334.00 cm/s AV Vmean:          216.500 cm/s AV VTI:            0.694 m AV Peak Grad:      44.6 mmHg AV Mean Grad:      22.5 mmHg LVOT Vmax:         146.00 cm/s LVOT Vmean:        100.000 cm/s LVOT VTI:          0.330 m LVOT/AV VTI ratio: 0.48 AI PHT:            489 msec  AORTA Ao Root diam: 4.30 cm Ao Asc diam:  4.50  cm MITRAL VALVE                TRICUSPID VALVE MV Area (PHT): 2.19 cm     TR Peak grad:   33.6 mmHg MV Decel Time: 347 msec     TR Vmax:        290.00 cm/s MV E velocity: 130.00 cm/s  Estimated RAP:  3.00 mmHg MV A velocity: 152.00 cm/s  RVSP:           36.6 mmHg MV E/A  ratio:  0.86                             SHUNTS                             Systemic VTI:  0.33 m                             Systemic Diam: 2.80 cm Kirk Ruths MD Electronically signed by Kirk Ruths MD Signature Date/Time: 05/15/2022/1:32:57 PM    Final    NM PET Image Initial (PI) Skull Base To Thigh  Result Date: 05/08/2022 CLINICAL DATA:  Subsequent treatment strategy for head and neck cancer. EXAM: NUCLEAR MEDICINE PET SKULL BASE TO THIGH TECHNIQUE: 10.9 mCi F-18 FDG was injected intravenously. Full-ring PET imaging was performed from the skull base to thigh after the radiotracer. CT data was obtained and used for attenuation correction and anatomic localization. Fasting blood glucose: 108 mg/dl COMPARISON:  CT a chest 05/29/2021. Neck CT 11/08/2021. CTA abdomen and pelvis 09/15/2020. FINDINGS: Mediastinal blood pool activity: SUV max 2.4 Liver activity: SUV max NA NECK: Asymmetric Hypermetabolism in the left oropharynx may be related to primary neoplasm (SUV max = 7.0). Hypermetabolism associated with the left epiglottis demonstrates SUV max = 11.9. Multiple right level II cervical nodes identified measuring up to 1.2 cm short axis (39/5) with SUV max = 6.9. Incidental CT findings: None. CHEST: No hypermetabolic mediastinal or hilar nodes. No suspicious pulmonary nodules on the CT scan. Incidental CT findings: Coronary artery calcification is evident. Moderate atherosclerotic calcification is noted in the wall of the thoracic aorta. Centrilobular emphysema noted in the lungs bilaterally. ABDOMEN/PELVIS: 1.2 cm short axis aortocaval node in the abdomen (141/5) demonstrates SUV max = 5.0. This lymph node was 0.7 cm short axis on 09/15/2020. No other hypermetabolic lymphadenopathy in the abdomen. No hypermetabolic liver lesion. Incidental CT findings: Tiny layering calcified gallstones evident. Vascular calcification noted in the hilum of each kidney. Small cyst noted lower pole left kidney status post  aorto iliac endograft placement. Advanced diverticular disease noted left colon without diverticulitis SKELETON: No worrisome lytic or sclerotic osseous abnormality. Benign appearing left iliac lesion is stable since the study from 09/15/2020 in shows no hypermetabolism. Incidental CT findings: None. IMPRESSION: 1. Asymmetric Hypermetabolism in the left oropharynx and involving the left epiglottis. Imaging features compatible with primary neoplasm. 2. Contralateral (right-sided) hypermetabolic level II cervical lymph nodes compatible with metastatic disease. No hypermetabolic lymphadenopathy in the left neck. 3. No hypermetabolic lymphadenopathy in the chest. 4. 1.2 cm short axis aortocaval lymph node in the upper abdomen is hypermetabolic. Metastatic disease is a concern. This lymph node has increased from 0.7 cm on a study from 2022. 5. No evidence for hypermetabolic metastatic disease in the pelvis. 6.  Emphysema. (ICD10-J43.9) 7.  Aortic Atherosclerois (ICD10-170.0) Electronically  Signed   By: Misty Stanley M.D.   On: 05/08/2022 07:59    All questions were answered. The patient knows to call the clinic with any problems, questions or concerns. I spent 30 minutes in the care of this patient including H and P, review of records, counseling and coordination of care.     Benay Pike, MD 05/31/2022 6:07 PM

## 2022-05-31 NOTE — Assessment & Plan Note (Signed)
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   Further investigation of the aortocaval lymph node showed atypical lymphocytes suggestive of lymphoproliferative disorder.  However upon further review, it appears that the lymph node that was biopsied is not the lymph node that was desired to be biopsied.although this was abnormal.  We have requested a second biopsy for better characterization of the diagnosis.  Okay to proceed with cisplatin tomorrow, labs reviewed and satisfactory.  We will review pathology results from the biopsy scheduled for later November and discussed about treatment plan for lymphoma if this is indeed the diagnosis.  He currently remains asymptomatic from the possible lymphoma standpoint.  He understands adverse effects and the plan so far.  Return to clinic in 1 week or sooner as needed.

## 2022-06-01 ENCOUNTER — Other Ambulatory Visit: Payer: Self-pay

## 2022-06-01 ENCOUNTER — Inpatient Hospital Stay: Payer: Medicare HMO

## 2022-06-01 ENCOUNTER — Inpatient Hospital Stay: Payer: Medicare HMO | Admitting: Dietician

## 2022-06-01 ENCOUNTER — Ambulatory Visit
Admission: RE | Admit: 2022-06-01 | Discharge: 2022-06-01 | Disposition: A | Discharge status: Home / Self Care | Payer: Medicare HMO | Source: Ambulatory Visit | Attending: Radiation Oncology | Admitting: Radiation Oncology

## 2022-06-01 VITALS — BP 148/81 | HR 90 | Temp 97.7°F | Resp 18

## 2022-06-01 DIAGNOSIS — C321 Malignant neoplasm of supraglottis: Secondary | ICD-10-CM

## 2022-06-01 DIAGNOSIS — Z510 Encounter for antineoplastic radiation therapy: Secondary | ICD-10-CM | POA: Diagnosis not present

## 2022-06-01 DIAGNOSIS — Z5111 Encounter for antineoplastic chemotherapy: Secondary | ICD-10-CM | POA: Diagnosis not present

## 2022-06-01 LAB — RAD ONC ARIA SESSION SUMMARY
Course Elapsed Days: 2
Plan Fractions Treated to Date: 3
Plan Prescribed Dose Per Fraction: 2 Gy
Plan Total Fractions Prescribed: 35
Plan Total Prescribed Dose: 70 Gy
Reference Point Dosage Given to Date: 6 Gy
Reference Point Session Dosage Given: 2 Gy
Session Number: 3

## 2022-06-01 MED ORDER — PALONOSETRON HCL INJECTION 0.25 MG/5ML
0.2500 mg | Freq: Once | INTRAVENOUS | Status: AC
  Administered 2022-06-01: 0.25 mg via INTRAVENOUS
  Filled 2022-06-01: qty 5

## 2022-06-01 MED ORDER — SODIUM CHLORIDE 0.9 % IV SOLN
40.0000 mg/m2 | Freq: Once | INTRAVENOUS | Status: AC
  Administered 2022-06-01: 91 mg via INTRAVENOUS
  Filled 2022-06-01: qty 91

## 2022-06-01 MED ORDER — SODIUM CHLORIDE 0.9 % IV SOLN: Freq: Once | INTRAVENOUS | Status: AC

## 2022-06-01 MED ORDER — MAGNESIUM SULFATE 2 GM/50ML IV SOLN
2.0000 g | Freq: Once | INTRAVENOUS | Status: AC
  Administered 2022-06-01: 2 g via INTRAVENOUS
  Filled 2022-06-01: qty 50

## 2022-06-01 MED ORDER — SODIUM CHLORIDE 0.9 % IV SOLN
10.0000 mg | Freq: Once | INTRAVENOUS | Status: AC
  Administered 2022-06-01: 10 mg via INTRAVENOUS
  Filled 2022-06-01: qty 10

## 2022-06-01 MED ORDER — SODIUM CHLORIDE 0.9 % IV SOLN
150.0000 mg | Freq: Once | INTRAVENOUS | Status: AC
  Administered 2022-06-01: 150 mg via INTRAVENOUS
  Filled 2022-06-01: qty 150

## 2022-06-01 MED ORDER — POTASSIUM CHLORIDE IN NACL 20-0.9 MEQ/L-% IV SOLN
Freq: Once | INTRAVENOUS | Status: AC
  Filled 2022-06-01: qty 1000

## 2022-06-01 NOTE — Progress Notes (Signed)
Oncology Nurse Navigator Documentation   Met with Mr. Schnitzler and his wife Levander Campion to provide PEG education prior to                 placement.   Using  PEG teaching device   and Teach Back, provided education for PEG use and care, including: hand hygiene, gravity bolus administration of daily water flushes and nutritional supplement, fluids and medications; care of tube insertion site including daily dressing change and cleaning; S&S of infection.   Mr. Guiffre and Levander Campion correctly verbalized/ demonstrated procedures for and provided correct return demonstration of gravity administration of water, dressing change and site care.  I provided written instructions for PEG flushing/dressing change in support of verbal instruction.   I provided/described contents of Start of Care Bolus Feeding Kit (3 60 cc syringes, 1 box 4x4 drainage sponges, 1 package mesh briefs, 1 roll paper tape, 1 case Osmolite 1.5).  He voiced understanding he is to start using Osmolite per guidance of Nutrition. He understands I will be available for ongoing PEG support. Provided barium sulfate prep which I obtained from [facility] and reviewed instructions.    [name] RN, BSN, OCN Head & Neck Oncology Nurse [place] at [facility] Phone # [phone]  Fax # [phone]

## 2022-06-01 NOTE — Progress Notes (Signed)
Nutrition Assessment   Reason for Assessment: HNC   ASSESSMENT: 76  year old male with newly diagnosed SCC of supraglottis. Lymph node biopsy revealing atypical lymphocytes concerning for metastatic disease under workup. He is receiving concurrent chemoradiation with weekly cisplatin (first treatment 10/27). Patient is under the care of Dr. Chryl Heck and Dr. Isidore Moos.  PEG planned 11/1  Past medical history includes aortic stenosis s/p abdominal aortic stent 08/29/20, arthritis, atrial fibrillation, PAD   Met with patient and wife in infusion. Nurse navigator completing PEG care education at visit. Patient reports having a good appetite and eating well. He denies swallowing difficulty. Patient recalls 3 meals + snacks at baseline. Breakfast is english muffin, avocado, egg, sometimes bacon/sausage, glass of 2% milk. He typically eats leftovers for lunch. Yesterday was a hamburger all the way with a few fries. He had grilled chicken salad with bleu cheese dressing for dinner followed by slice of apple bread/butter for dessert. Patient snacks on peanut butter crackers or nuts in between meals. He is drinking ~1 liter of water daily. Patient denies nausea, vomiting, diarrhea, constipation.   Nutrition Focused Physical Exam: deferred    Medications: diltiazem, hycet, lopressor, nicoderm, prilosec, zofran, compazine, xarelto, crestor   Labs: reviewed    Anthropometrics:   Height: 6'4" Weight: 220 lb 14.4 oz  UBW: 220-225 lb  BMI: 26.89   Estimated Energy Needs  Kcals: 2500-3000 Protein: 220-225 lb (per pt) Fluid: >2.5 L   NUTRITION DIAGNOSIS: Predicted suboptimal intake related to newly diagnosed HNC as evidenced by likely side effects of therapy (difficulty/pain with swallowing, altered taste, dry mouth) affecting ability to maintain adequate nutrition/hydration during treatment   INTERVENTION:  Pending PEG placement 06/06/22 - will defer bolus feeding ed Educated on importance of  adequate calorie/protein energy intake to maintain strength/weights throughout treatment Continue eating 3 meals with snacks in between and at bedtime - handout with ideas provided  Encouraged high calorie, high protein foods for weight maintenance - high calorie shake recipes given Discussed ways of adding protein/calories to foods and easy to swallow textures - handout provided Suggested drinking ONS for added calories/protein - samples of Ensure/Boost/CIB provided for pt to try Educated on importance of oral care, encouraged baking soda salt water rinses several times daily - handout + recipe provided Contact information given   MONITORING, EVALUATION, GOAL: Patient will tolerate increased calories and protein to minimize weight loss during treatment   Next Visit: weekly with treatment

## 2022-06-01 NOTE — Patient Instructions (Addendum)
[facility]  Discharge Instructions: Thank you for choosing [facility] to provide your oncology and hematology care.   If you have a lab appointment with the [facility], please go directly to the [facility] and check in at the registration area.   Wear comfortable clothing and clothing appropriate for easy access to any Portacath or PICC line.   We strive to give you quality time with your provider. You may need to reschedule your appointment if you arrive late (15 or more minutes).  Arriving late affects you and other patients whose appointments are after yours.  Also, if you miss three or more appointments without notifying the office, you may be dismissed from the clinic at the provider's discretion.      For prescription refill requests, have your pharmacy contact our office and allow 72 hours for refills to be completed.    Today you received the following chemotherapy and/or immunotherapy agents: Cisplatin        To help prevent nausea and vomiting after your treatment, we encourage you to take your nausea medication as directed.  BELOW ARE SYMPTOMS THAT SHOULD BE REPORTED IMMEDIATELY: *FEVER GREATER THAN 100.4 F (38 C) OR HIGHER *CHILLS OR SWEATING *NAUSEA AND VOMITING THAT IS NOT CONTROLLED WITH YOUR NAUSEA MEDICATION *UNUSUAL SHORTNESS OF BREATH *UNUSUAL BRUISING OR BLEEDING *URINARY PROBLEMS (pain or burning when urinating, or frequent urination) *BOWEL PROBLEMS (unusual diarrhea, constipation, pain near the anus) TENDERNESS IN MOUTH AND THROAT WITH OR WITHOUT PRESENCE OF ULCERS (sore throat, sores in mouth, or a toothache) UNUSUAL RASH, SWELLING OR PAIN  UNUSUAL VAGINAL DISCHARGE OR ITCHING   Items with * indicate a potential emergency and should be followed up as soon as possible or go to the Emergency Department if any problems should occur.  Please show the CHEMOTHERAPY ALERT CARD or IMMUNOTHERAPY ALERT CARD at check-in  to the Emergency Department and triage nurse.  Should you have questions after your visit or need to cancel or reschedule your appointment, please contact [facility]  Dept: [phone]  and follow the prompts.  Office hours are 8:00 a.m. to 4:30 p.m. Monday - Friday. Please note that voicemails left after 4:00 p.m. may not be returned until the following business day.  We are closed weekends and major holidays. You have access to a nurse at all times for urgent questions. Please call the main number to the clinic Dept: [phone] and follow the prompts.   For any non-urgent questions, you may also contact your provider using MyChart. We now offer e-Visits for anyone 78 and older to request care online for non-urgent symptoms. For details visit [email or web address].   Also download the MyChart app! Go to the app store, search "MyChart", open the app, select [facility], and log in with your MyChart username and password.  Masks are optional in the cancer centers. If you would like for your care team to wear a mask while they are taking care of you, please let them know. You may have one support person who is at least 78 years old accompany you for your appointments. Cisplatin Injection What is this medication? CISPLATIN (SIS pla tin) treats some types of cancer. It works by slowing down the growth of cancer cells. This medicine may be used for other purposes; ask your health care provider or pharmacist if you have questions. COMMON BRAND NAME(S): Platinol, Platinol -AQ What should I tell my care team before I take  this medication? They need to know if you have any of these conditions: Eye disease, vision problems Hearing problems Kidney disease Low blood counts, such as low white cells, platelets, or red blood cells Tingling of the fingers or toes, or other nerve disorder An unusual or allergic reaction to cisplatin, carboplatin, oxaliplatin, other  medications, foods, dyes, or preservatives If you or your partner are pregnant or trying to get pregnant Breast-feeding How should I use this medication? This medication is injected into a vein. It is given by your care team in a hospital or clinic setting. Talk to your care team about the use of this medication in children. Special care may be needed. Overdosage: If you think you have taken too much of this medicine contact a poison control center or emergency room at once. NOTE: This medicine is only for you. Do not share this medicine with others. What if I miss a dose? Keep appointments for follow-up doses. It is important not to miss your dose. Call your care team if you are unable to keep an appointment. What may interact with this medication? Do not take this medication with any of the following: Live virus vaccines This medication may also interact with the following: Certain antibiotics, such as amikacin, gentamicin, neomycin, polymyxin B, streptomycin, tobramycin, vancomycin Foscarnet This list may not describe all possible interactions. Give your health care provider a list of all the medicines, herbs, non-prescription drugs, or dietary supplements you use. Also tell them if you smoke, drink alcohol, or use illegal drugs. Some items may interact with your medicine. What should I watch for while using this medication? Your condition will be monitored carefully while you are receiving this medication. You may need blood work done while taking this medication. This medication may make you feel generally unwell. This is not uncommon, as chemotherapy can affect healthy cells as well as cancer cells. Report any side effects. Continue your course of treatment even though you feel ill unless your care team tells you to stop. This medication may increase your risk of getting an infection. Call your care team for advice if you get a fever, chills, sore throat, or other symptoms of a cold or flu.  Do not treat yourself. Try to avoid being around people who are sick. Avoid taking medications that contain aspirin, acetaminophen, ibuprofen, naproxen, or ketoprofen unless instructed by your care team. These medications may hide a fever. This medication may increase your risk to bruise or bleed. Call your care team if you notice any unusual bleeding. Be careful brushing or flossing your teeth or using a toothpick because you may get an infection or bleed more easily. If you have any dental work done, tell your dentist you are receiving this medication. Drink fluids as directed while you are taking this medication. This will help protect your kidneys. Call your care team if you get diarrhea. Do not treat yourself. Talk to your care team if you or your partner wish to become pregnant or think you might be pregnant. This medication can cause serious birth defects if taken during pregnancy and for 14 months after the last dose. A negative pregnancy test is required before starting this medication. A reliable form of contraception is recommended while taking this medication and for 14 months after the last dose. Talk to your care team about effective forms of contraception. Do not father a child while taking this medication and for 11 months after the last dose. Use a condom during sex during  this time period. Do not breast-feed while taking this medication. This medication may cause infertility. Talk to your care team if you are concerned about your fertility. What side effects may I notice from receiving this medication? Side effects that you should report to your care team as soon as possible: Allergic reactions--skin rash, itching, hives, swelling of the face, lips, tongue, or throat Eye pain, change in vision, vision loss Hearing loss, ringing in ears Infection--fever, chills, cough, sore throat, wounds that don't heal, pain or trouble when passing urine, general feeling of discomfort or being  unwell Kidney injury--decrease in the amount of urine, swelling of the ankles, hands, or feet Low red blood cell level--unusual weakness or fatigue, dizziness, headache, trouble breathing Painful swelling, warmth, or redness of the skin, blisters or sores at the infusion site Pain, tingling, or numbness in the hands or feet Unusual bruising or bleeding Side effects that usually do not require medical attention (report to your care team if they continue or are bothersome): Hair loss Nausea Vomiting This list may not describe all possible side effects. Call your doctor for medical advice about side effects. You may report side effects to FDA at 1-800-FDA-1088. Where should I keep my medication? This medication is given in a hospital or clinic. It will not be stored at home. NOTE: This sheet is a summary. It may not cover all possible information. If you have questions about this medicine, talk to your doctor, pharmacist, or health care provider.  2023 Elsevier/Gold Standard (2021-11-17 00:00:00)

## 2022-06-04 ENCOUNTER — Other Ambulatory Visit: Payer: Self-pay

## 2022-06-04 ENCOUNTER — Ambulatory Visit
Admission: RE | Admit: 2022-06-04 | Discharge: 2022-06-04 | Disposition: A | Discharge status: Home / Self Care | Payer: Medicare HMO | Source: Ambulatory Visit | Attending: Radiation Oncology | Admitting: Radiation Oncology

## 2022-06-04 ENCOUNTER — Telehealth: Payer: Self-pay | Admitting: *Deleted

## 2022-06-04 ENCOUNTER — Other Ambulatory Visit: Payer: Self-pay | Admitting: Radiation Oncology

## 2022-06-04 DIAGNOSIS — Z510 Encounter for antineoplastic radiation therapy: Secondary | ICD-10-CM | POA: Diagnosis not present

## 2022-06-04 DIAGNOSIS — C321 Malignant neoplasm of supraglottis: Secondary | ICD-10-CM | POA: Diagnosis not present

## 2022-06-04 DIAGNOSIS — Z5111 Encounter for antineoplastic chemotherapy: Secondary | ICD-10-CM | POA: Diagnosis not present

## 2022-06-04 LAB — RAD ONC ARIA SESSION SUMMARY
Course Elapsed Days: 5
Plan Fractions Treated to Date: 4
Plan Prescribed Dose Per Fraction: 2 Gy
Plan Total Fractions Prescribed: 35
Plan Total Prescribed Dose: 70 Gy
Reference Point Dosage Given to Date: 8 Gy
Reference Point Session Dosage Given: 2 Gy
Session Number: 4

## 2022-06-04 MED ORDER — SONAFINE EX EMUL: 1.0000 | Freq: Two times a day (BID) | CUTANEOUS | Status: DC

## 2022-06-04 MED ORDER — LIDOCAINE VISCOUS HCL 2 % MT SOLN: OROMUCOSAL | 3 refills | Status: DC

## 2022-06-04 NOTE — Telephone Encounter (Signed)
LM for patient regarding chemo follow up. To call back if he has any questions or concerns

## 2022-06-04 NOTE — Progress Notes (Signed)
Pt here for patient teaching.    Pt given Radiation and You booklet, skin care instructions, and Sonafine.    Reviewed areas of pertinence such as fatigue, hair loss in treatment field, mouth changes, skin changes, throat changes, and taste changes .   Pt able to give teach back of drink plenty of water,apply Sonafine bid and avoid applying anything to skin within 4 hours of treatment.   Pt verbalizes understanding of information given and will contact nursing with any questions or concerns.    [email or web address]

## 2022-06-04 NOTE — Telephone Encounter (Signed)
-----   Message from Charleston Poot, RN sent at 06/01/2022 12:32 PM EDT ----- Regarding: First time/ Cisplatin/ Dr Chryl Heck pt Hello,  Pt had first time cisplatin today. Tolerated well  Thanks, Libbie K

## 2022-06-05 ENCOUNTER — Other Ambulatory Visit: Payer: Self-pay | Admitting: Radiology

## 2022-06-05 ENCOUNTER — Other Ambulatory Visit: Payer: Self-pay

## 2022-06-05 ENCOUNTER — Ambulatory Visit
Admission: RE | Admit: 2022-06-05 | Discharge: 2022-06-05 | Disposition: A | Discharge status: Home / Self Care | Payer: Medicare HMO | Source: Ambulatory Visit | Attending: Radiation Oncology | Admitting: Radiation Oncology

## 2022-06-05 DIAGNOSIS — C321 Malignant neoplasm of supraglottis: Secondary | ICD-10-CM | POA: Diagnosis not present

## 2022-06-05 DIAGNOSIS — Z510 Encounter for antineoplastic radiation therapy: Secondary | ICD-10-CM | POA: Diagnosis not present

## 2022-06-05 DIAGNOSIS — Z5111 Encounter for antineoplastic chemotherapy: Secondary | ICD-10-CM | POA: Diagnosis not present

## 2022-06-05 DIAGNOSIS — R1310 Dysphagia, unspecified: Secondary | ICD-10-CM

## 2022-06-05 LAB — RAD ONC ARIA SESSION SUMMARY
Course Elapsed Days: 6
Plan Fractions Treated to Date: 5
Plan Prescribed Dose Per Fraction: 2 Gy
Plan Total Fractions Prescribed: 35
Plan Total Prescribed Dose: 70 Gy
Reference Point Dosage Given to Date: 10 Gy
Reference Point Session Dosage Given: 2 Gy
Session Number: 5

## 2022-06-06 ENCOUNTER — Ambulatory Visit (HOSPITAL_COMMUNITY): Payer: Medicare HMO

## 2022-06-06 ENCOUNTER — Ambulatory Visit: Payer: Medicare HMO | Admitting: Physician Assistant

## 2022-06-06 ENCOUNTER — Other Ambulatory Visit: Payer: Self-pay

## 2022-06-06 ENCOUNTER — Ambulatory Visit
Admission: RE | Admit: 2022-06-06 | Discharge: 2022-06-06 | Disposition: A | Discharge status: Home / Self Care | Payer: Medicare HMO | Source: Ambulatory Visit | Attending: Radiation Oncology | Admitting: Radiation Oncology

## 2022-06-06 ENCOUNTER — Ambulatory Visit (HOSPITAL_COMMUNITY)
Admission: RE | Admit: 2022-06-06 | Discharge: 2022-06-06 | Disposition: A | Discharge status: Home / Self Care | Payer: Medicare HMO | Source: Ambulatory Visit | Attending: Hematology and Oncology | Admitting: Hematology and Oncology

## 2022-06-06 ENCOUNTER — Other Ambulatory Visit: Payer: Medicare HMO

## 2022-06-06 ENCOUNTER — Telehealth: Payer: Self-pay | Admitting: Pharmacist

## 2022-06-06 ENCOUNTER — Telehealth: Payer: Self-pay | Admitting: Family Medicine

## 2022-06-06 DIAGNOSIS — C321 Malignant neoplasm of supraglottis: Secondary | ICD-10-CM | POA: Diagnosis not present

## 2022-06-06 DIAGNOSIS — Z510 Encounter for antineoplastic radiation therapy: Secondary | ICD-10-CM | POA: Diagnosis not present

## 2022-06-06 LAB — RAD ONC ARIA SESSION SUMMARY
Course Elapsed Days: 7
Plan Fractions Treated to Date: 6
Plan Prescribed Dose Per Fraction: 2 Gy
Plan Total Fractions Prescribed: 35
Plan Total Prescribed Dose: 70 Gy
Reference Point Dosage Given to Date: 12 Gy
Reference Point Session Dosage Given: 2 Gy
Session Number: 6

## 2022-06-06 NOTE — Telephone Encounter (Signed)
Spoke with patient to schedule Medicare Annual Wellness Visit (AWV) either virtually or in office.  Patient stated he was going through cancer treatment and wanted a call back mid dec   Last AWV  06/07/21 please schedule with Nurse Health Adviser   45 min for awv-i and in office appointments 30 min for awv-s  phone/virtual appointments

## 2022-06-06 NOTE — Telephone Encounter (Signed)
Xarelto patient assistance application faxed to company

## 2022-06-07 ENCOUNTER — Ambulatory Visit
Admission: RE | Admit: 2022-06-07 | Discharge: 2022-06-07 | Disposition: A | Discharge status: Home / Self Care | Payer: Medicare HMO | Source: Ambulatory Visit | Attending: Radiation Oncology | Admitting: Radiation Oncology

## 2022-06-07 ENCOUNTER — Other Ambulatory Visit: Payer: Self-pay

## 2022-06-07 ENCOUNTER — Inpatient Hospital Stay: Payer: Medicare HMO

## 2022-06-07 ENCOUNTER — Ambulatory Visit: Payer: Medicare HMO | Admitting: Adult Health

## 2022-06-07 ENCOUNTER — Inpatient Hospital Stay (HOSPITAL_BASED_OUTPATIENT_CLINIC_OR_DEPARTMENT_OTHER): Payer: Medicare HMO | Admitting: Physician Assistant

## 2022-06-07 VITALS — BP 121/72 | HR 74 | Temp 97.6°F | Resp 18 | Wt 221.4 lb

## 2022-06-07 DIAGNOSIS — Z510 Encounter for antineoplastic radiation therapy: Secondary | ICD-10-CM | POA: Diagnosis not present

## 2022-06-07 DIAGNOSIS — D709 Neutropenia, unspecified: Secondary | ICD-10-CM | POA: Insufficient documentation

## 2022-06-07 DIAGNOSIS — C321 Malignant neoplasm of supraglottis: Secondary | ICD-10-CM | POA: Insufficient documentation

## 2022-06-07 DIAGNOSIS — R1310 Dysphagia, unspecified: Secondary | ICD-10-CM | POA: Insufficient documentation

## 2022-06-07 DIAGNOSIS — Z452 Encounter for adjustment and management of vascular access device: Secondary | ICD-10-CM | POA: Insufficient documentation

## 2022-06-07 DIAGNOSIS — R5383 Other fatigue: Secondary | ICD-10-CM | POA: Insufficient documentation

## 2022-06-07 DIAGNOSIS — Z5111 Encounter for antineoplastic chemotherapy: Secondary | ICD-10-CM | POA: Insufficient documentation

## 2022-06-07 DIAGNOSIS — R590 Localized enlarged lymph nodes: Secondary | ICD-10-CM | POA: Insufficient documentation

## 2022-06-07 DIAGNOSIS — D72819 Decreased white blood cell count, unspecified: Secondary | ICD-10-CM | POA: Insufficient documentation

## 2022-06-07 DIAGNOSIS — J029 Acute pharyngitis, unspecified: Secondary | ICD-10-CM | POA: Insufficient documentation

## 2022-06-07 DIAGNOSIS — R634 Abnormal weight loss: Secondary | ICD-10-CM | POA: Insufficient documentation

## 2022-06-07 DIAGNOSIS — R630 Anorexia: Secondary | ICD-10-CM | POA: Insufficient documentation

## 2022-06-07 DIAGNOSIS — K5900 Constipation, unspecified: Secondary | ICD-10-CM | POA: Insufficient documentation

## 2022-06-07 DIAGNOSIS — D696 Thrombocytopenia, unspecified: Secondary | ICD-10-CM | POA: Insufficient documentation

## 2022-06-07 LAB — RAD ONC ARIA SESSION SUMMARY
Course Elapsed Days: 8
Plan Fractions Treated to Date: 7
Plan Prescribed Dose Per Fraction: 2 Gy
Plan Total Fractions Prescribed: 35
Plan Total Prescribed Dose: 70 Gy
Reference Point Dosage Given to Date: 14 Gy
Reference Point Session Dosage Given: 2 Gy
Session Number: 7

## 2022-06-07 LAB — BASIC METABOLIC PANEL - CANCER CENTER ONLY
Anion gap: 4 — ABNORMAL LOW (ref 5–15)
BUN: 17 mg/dL (ref 8–23)
CO2: 33 mmol/L — ABNORMAL HIGH (ref 22–32)
Calcium: 9.3 mg/dL (ref 8.9–10.3)
Chloride: 102 mmol/L (ref 98–111)
Creatinine: 0.95 mg/dL (ref 0.61–1.24)
GFR, Estimated: >60 mL/min (ref >60)
Glucose, Bld: 103 mg/dL — ABNORMAL HIGH (ref 70–99)
Potassium: 4 mmol/L (ref 3.5–5.1)
Sodium: 139 mmol/L (ref 135–145)

## 2022-06-07 LAB — CBC WITH DIFFERENTIAL (CANCER CENTER ONLY)
Abs Immature Granulocytes: 0.08 10*3/uL — ABNORMAL HIGH (ref 0.00–0.07)
Basophils Absolute: 0 10*3/uL (ref 0.0–0.1)
Basophils Relative: 1 %
Eosinophils Absolute: 0.2 10*3/uL (ref 0.0–0.5)
Eosinophils Relative: 3 %
HCT: 47 % (ref 39.0–52.0)
Hemoglobin: 16.2 g/dL (ref 13.0–17.0)
Immature Granulocytes: 1 %
Lymphocytes Relative: 20 %
Lymphs Abs: 1.2 10*3/uL (ref 0.7–4.0)
MCH: 32.9 pg (ref 26.0–34.0)
MCHC: 34.5 g/dL (ref 30.0–36.0)
MCV: 95.3 fL (ref 80.0–100.0)
Monocytes Absolute: 0.5 10*3/uL (ref 0.1–1.0)
Monocytes Relative: 8 %
Neutro Abs: 4.1 10*3/uL (ref 1.7–7.7)
Neutrophils Relative %: 67 %
Platelet Count: 185 10*3/uL (ref 150–400)
RBC: 4.93 MIL/uL (ref 4.22–5.81)
RDW: 13 % (ref 11.5–15.5)
WBC Count: 6.1 10*3/uL (ref 4.0–10.5)
nRBC: 0 % (ref 0.0–0.2)

## 2022-06-07 LAB — MAGNESIUM: Magnesium: 2.1 mg/dL (ref 1.7–2.4)

## 2022-06-07 MED FILL — Fosaprepitant Dimeglumine For IV Infusion 150 MG (Base Eq): INTRAVENOUS | Qty: 5 | Status: AC

## 2022-06-07 MED FILL — Dexamethasone Sodium Phosphate Inj 100 MG/10ML: INTRAMUSCULAR | Qty: 1 | Status: AC

## 2022-06-07 NOTE — Progress Notes (Signed)
[place] NOTE  Patient Care Team: [name], MD as PCP - General [name], MD as PCP - Cardiology (Cardiology) Viona Gilmore, [facility] as Pharmacist (Pharmacist) Malmfelt, Stephani Police, RN as Oncology Nurse Navigator [name], MD as Consulting Physician (Radiation Oncology) Benay Pike, MD as Consulting Physician (Hematology and Oncology)  CHIEF COMPLAINTS/PURPOSE OF CONSULTATION:  SCC supraglottis  ONCOLOGIC HISTORY: Initially presented to ENT with chronic left-sided sore throat for almost a year.  Initial evaluation did not show any possible causes for sore throat.  He was recommended PPI twice daily but went back to ENT with persistent sore throat he once again had a repeat fiberoptic exam which was stable.   CT neck done on November 08, 2021 did show subcentimeter focus of mucosal hyperenhancement along the posterior aspect of the uvula  laryngoscopy with direct visualization recommended to exclude a mucosal lesion at the site.  Additionally few tiny cystic-appearing foci along the left glossotonsillar sulcus.  Direct visualization recommended.  Nonspecific mildly enlarged right level 2 lymph node.  Multiple small calcific foci in the left palatine tonsil.Repeat fiberoptic exam was unremarkable hence Dr. 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  Tongue biopsy showed benign squamous mucosa with low to intermediate grade dysplasia. 05/07/2022: PET/CT showed asymmetric hypermetabolism in the left oropharynx and involving the left epiglottis.  Contralateral hypermetabolic level 2 cervical lymph nodes compatible with metastatic disease.  No hypermetabolic adenopathy in the left neck or in the chest.  1.2 cm short axis aortocaval lymph node in the upper abdomen is  hypermetabolic metastatic disease is a concern.  This lymph node has increased from 0.7 cm on the study from 2022.  No other metastatic disease. Started radiation on 05/30/2022 and weekly cisplatin on 06/01/2022.   INTERIM HISTORY: Cory [name] returns for a toxicity check before his Cycle 2 weekly cisplatin infusion scheduled for tomorrow. Cory [name] reports continued compliance with dysphagia and a sore throat with certain foods such as apples.  He denies regurgitation at this time. Patient is awaiting to get his  lidocaine suspension filled by pharmacy to help with the sore throat.  He denies nausea, vomiting or abdominal. He denies easy bruising or signs of bleeding. Patient denies fevers, chills, night sweats, shortness of breath, chest pain or cough. He as no other complaints. Rest of the pertinent 10 point ROS reviewed and negative  MEDICAL HISTORY:  Past Medical History:  Diagnosis Date   Aortic stenosis    a. Moderate by echo 05/2020.   Arthritis    right hip   Cancer ([place])    skin cancer nose, right left, upper left basel cell   Dysrhythmia    a-fib   PAF (paroxysmal atrial fibrillation) (HCC)    Peripheral arterial disease (HCC)    Tobacco abuse     SURGICAL HISTORY: Past Surgical History:  Procedure Laterality Date   ABDOMINAL AORTIC ENDOVASCULAR STENT GRAFT N/A 08/29/2020   Procedure: ABDOMINAL AORTIC ENDOVASCULAR STENT GRAFT;  Surgeon: Elam Dutch, MD;  Location: [place];  Service: Vascular;  Laterality: N/A;   ABDOMINAL AORTOGRAM W/LOWER EXTREMITY Bilateral 06/24/2020   Procedure: ABDOMINAL AORTOGRAM W/LOWER EXTREMITY;  Surgeon: Elam Dutch, MD;  Location: [place] [facility];  Service: Cardiovascular;  Laterality: Bilateral;   BACK SURGERY     2005   BIOPSY  05/21/2022  Procedure: BIOPSY;  Surgeon: Irving Copas., MD;  Location: [facility];  Service: Gastroenterology;;   CERVICAL SPINE SURGERY     DIRECT LARYNGOSCOPY Bilateral 04/18/2022    Procedure: DIRECT LARYNGOSCOPY WITH BIOPSIES;  Surgeon: Melida Quitter, MD;  Location: [place];  Service: ENT;  Laterality: Bilateral;   EMBOLECTOMY Right 08/29/2020   Procedure: POPLITEAL AND TIBIAL EMBOLECTOMY;  Surgeon: Elam Dutch, MD;  Location: [place];  Service: Vascular;  Laterality: Right;   EMBOLIZATION Right 06/24/2020   Procedure: EMBOLIZATION;  Surgeon: Elam Dutch, MD;  Location: [place] [facility];  Service: Cardiovascular;  Laterality: Right;   ENDARTERECTOMY FEMORAL Right 08/29/2020   Procedure: ENDARTERECTOMY FEMORAL;  Surgeon: Elam Dutch, MD;  Location: [facility];  Service: Vascular;  Laterality: Right;   ESOPHAGOGASTRODUODENOSCOPY (EGD) WITH PROPOFOL N/A 05/21/2022   Procedure: ESOPHAGOGASTRODUODENOSCOPY (EGD) WITH PROPOFOL;  Surgeon: Irving Copas., MD;  Location: [facility];  Service: Gastroenterology;  Laterality: N/A;   EUS N/A 05/21/2022   Procedure: UPPER ENDOSCOPIC ULTRASOUND (EUS) RADIAL;  Surgeon: Irving Copas., MD;  Location: [facility];  Service: Gastroenterology;  Laterality: N/A;   EYE SURGERY     FEMORAL ARTERY EXPLORATION Right 06/25/2020   Procedure: RIGHT GROIN EXPLORATION Evacuation of Hematoma  WITH REPAIR OF RIGHT Common FEMORAL ARTERY.;  Surgeon: Angelia Mould, MD;  Location: [place];  Service: Vascular;  Laterality: Right;   FEMORAL-POPLITEAL BYPASS GRAFT Left 11/22/2020   Procedure: LEFT ABOVE KNEE-BELOW KNEE BYPASS TIBIAL PERITONEAL TRUNK REVERSE IPSILATERAL GREATER SAPHENOUS VEIN;  Surgeon: Elam Dutch, MD;  Location: [facility] OR;  Service: Vascular;  Laterality: Left;   FINE NEEDLE ASPIRATION N/A 05/21/2022   Procedure: FINE NEEDLE ASPIRATION (FNA) LINEAR;  Surgeon: Irving Copas., MD;  Location: [facility];  Service: Gastroenterology;  Laterality: N/A;   LEG SURGERY     Trauma   LOWER EXTREMITY ANGIOGRAPHY Left 11/17/2020   Procedure: Lower Extremity Angiography;  Surgeon: Marty Heck, MD;   Location: [place] [facility];  Service: Cardiovascular;  Laterality: Left;   PATCH ANGIOPLASTY Right 08/29/2020   Procedure: PATCH ANGIOPLASTY RIGHT BELOW KNEE POPLITEAL ARTERY AND RIGHT COMMON FEMORAL ARTERY;  Surgeon: Elam Dutch, MD;  Location: [place];  Service: Vascular;  Laterality: Right;   RETINAL DETACHMENT SURGERY     RIGID ESOPHAGOSCOPY  04/18/2022   Procedure: RIGID ESOPHAGOSCOPY;  Surgeon: Melida Quitter, MD;  Location: [place];  Service: ENT;;   TONSILLECTOMY  04/18/2022   Procedure: LEFT TONSILLECTOMY;  Surgeon: Melida Quitter, MD;  Location: [facility] OR;  Service: ENT;;   ULTRASOUND GUIDANCE FOR VASCULAR ACCESS Bilateral 08/29/2020   Procedure: ULTRASOUND GUIDANCE FOR VASCULAR ACCESS;  Surgeon: Elam Dutch, MD;  Location: [facility] OR;  Service: Vascular;  Laterality: Bilateral;    SOCIAL HISTORY: Social History   Socioeconomic History   Marital status: Married    Spouse name: Not on file   Number of children: 5   Years of education: Not on file   Highest education level: Master's degree (e.g., MA, MS, MEng, MEd, MSW, MBA)  Occupational History   Not on file  Tobacco Use   Smoking status: Every Day    Packs/day: 0.50    Years: 50.00    Total pack years: 25.00    Types: Cigarettes    Passive exposure: Never   Smokeless tobacco: Never  Vaping Use   Vaping Use: Never used  Substance and Sexual Activity   Alcohol use: Yes    Alcohol/week: 0.0 standard drinks  of alcohol    Comment: 4 to 5 times a week; beer, wine and liquor    Drug use: No   Sexual activity: Not Currently  Other Topics Concern   Not on file  Social History Narrative   Scores reading tests.  Lives with wife.     Social Determinants of Health   Financial Resource Strain: Low Risk  (05/24/2022)   Overall Financial Resource Strain (CARDIA)    Difficulty of Paying Living Expenses: Not hard at all  Food Insecurity: No Food Insecurity (05/24/2022)   Hunger Vital Sign    Worried About Running Out of Food in  the Last Year: Never true    Ran Out of Food in the Last Year: Never true  Transportation Needs: No Transportation Needs (05/24/2022)   PRAPARE - Hydrologist (Medical): No    Lack of Transportation (Non-Medical): No  Physical Activity: Insufficiently Active (01/08/2022)   Exercise Vital Sign    Days of Exercise per Week: 3 days    Minutes of Exercise per Session: 30 min  Stress: No Stress Concern Present (01/08/2022)   [place]    Feeling of Stress : Not at all  Social Connections: Moderately Isolated (01/08/2022)   Social Connection and Isolation Panel [NHANES]    Frequency of Communication with Friends and Family: More than three times a week    Frequency of Social Gatherings with Friends and Family: Three times a week    Attends Religious Services: Never    Active Member of Clubs or Organizations: No    Attends Archivist Meetings: Never    Marital Status: Married  Human resources officer Violence: Not At Risk (06/07/2021)   Humiliation, Afraid, Rape, and Kick questionnaire    Fear of Current or Ex-Partner: No    Emotionally Abused: No    Physically Abused: No    Sexually Abused: No    FAMILY HISTORY: Family History  Problem Relation Age of Onset   Ovarian cancer Mother    Alcoholism Father    Breast cancer Sister    Ovarian cancer Sister     ALLERGIES:  is allergic to zithromax [azithromycin].  MEDICATIONS:  Current Outpatient Medications  Medication Sig Dispense Refill   acetaminophen (TYLENOL) 500 MG tablet Take 500 mg by mouth every 6 (six) hours as needed (for pain.).     cetirizine (ZYRTEC) 10 MG tablet Take 1 tablet (10 mg total) by mouth daily. (Patient taking differently: Take 10 mg by mouth daily as needed for allergies.) 30 tablet 11   cholecalciferol (VITAMIN D) 25 MCG (1000 UNIT) tablet Take 1,000 Units by mouth 2 (two) times daily.     dexamethasone (DECADRON) 4  MG tablet Take 2 tablets daily x 3 days starting the day after cisplatin chemotherapy. Take with food. 30 tablet 1   diltiazem (TIADYLT ER) 120 MG 24 hr capsule TAKE 1 CAPSULE EVERY DAY (SCHEDULE OFFICE VISIT FOR FUTURE REFILLS) 90 capsule 1   HYDROcodone-acetaminophen (HYCET) 7.5-325 mg/15 ml solution Take 15 mLs by mouth every 6 (six) hours as needed for moderate pain. (Patient not taking: Reported on 06/04/2022) 250 mL 0   lidocaine (XYLOCAINE) 2 % solution Patient: Mix 1part 2% viscous lidocaine, 1part H20. Swish & swallow 18m of diluted mixture, 381m before meals  and at bedtime, up to QID prn sore throat. 200 mL 3   lidocaine-prilocaine (EMLA) cream Apply to affected area once 30 g 3  metoprolol tartrate (LOPRESSOR) 25 MG tablet Take 1 tablet (25 mg total) by mouth 2 (two) times daily as needed. 30 tablet 1   nicotine (NICODERM CQ - DOSED IN MG/24 HOURS) 14 mg/24hr patch Place 1 patch (14 mg total) onto the skin daily. Apply 21 mg patch daily x 6 wk, then '14mg'$  patch daily x 2 wk, then 7 mg patch daily x 2 wk 14 patch 0   nicotine (NICODERM CQ - DOSED IN MG/24 HOURS) 21 mg/24hr patch Place 1 patch (21 mg total) onto the skin daily. Apply 21 mg patch daily x 6 wk, then '14mg'$  patch daily x 2 wk, then 7 mg patch daily x 2 wk 14 patch 2   nicotine (NICODERM CQ - DOSED IN MG/24 HR) 7 mg/24hr patch Place 1 patch (7 mg total) onto the skin daily. Apply 21 mg patch daily x 6 wk, then '14mg'$  patch daily x 2 wk, then 7 mg patch daily x 2 wk 14 patch 0   omeprazole (PRILOSEC) 40 MG capsule Take 1 capsule (40 mg total) by mouth daily. 30 capsule 6   ondansetron (ZOFRAN) 8 MG tablet Take 1 tablet (8 mg total) by mouth every 8 (eight) hours as needed for nausea or vomiting. Start on the third day after cisplatin. 30 tablet 1   prochlorperazine (COMPAZINE) 10 MG tablet Take 1 tablet (10 mg total) by mouth every 6 (six) hours as needed (Nausea or vomiting). 30 tablet 1   rivaroxaban (XARELTO) 20 MG TABS tablet Take  1 tablet (20 mg total) by mouth daily with supper. 90 tablet 1   rosuvastatin (CRESTOR) 10 MG tablet Take 1 tablet (10 mg total) by mouth daily. 90 tablet 1   sodium chloride (OCEAN) 0.65 % SOLN nasal spray Place 1 spray into both nostrils as needed for congestion.     No current facility-administered medications for this visit.   Facility-Administered Medications Ordered in Other Visits  Medication Dose Route Frequency Provider Last Rate Last Admin   Sonafine emulsion 1 Application  1 Application Topical BID [name], MD         PHYSICAL EXAMINATION: ECOG PERFORMANCE STATUS: 1 - Symptomatic but completely ambulatory  Vitals:   06/07/22 0843  BP: 121/72  Pulse: 74  Resp: 18  Temp: 97.6 F (36.4 C)  SpO2: 95%   Filed Weights   06/07/22 0843  Weight: 221 lb 6 oz (100.4 kg)    GENERAL:alert, no distress and comfortable SKIN: skin color, texture, turgor are normal, no rashes or significant lesions EYES: normal, conjunctiva are pink and non-injected, sclera clear OROPHARYNX:no exudate, no erythema and lips, buccal mucosa, and tongue normal  NECK: supple, non-tender, without nodularity LYMPH: Palpable left cervical lymph adenopathy LUNGS: clear to auscultation and percussion with normal breathing effort HEART: regular rate & rhythm and no murmurs and no lower extremity edema Musculoskeletal:no cyanosis of digits and no clubbing  PSYCH: alert & oriented x 3 with fluent speech NEURO: no focal motor/sensory deficits  LABORATORY DATA:  I have reviewed the data as listed Lab Results  Component Value Date   WBC 6.1 06/07/2022   HGB 16.2 06/07/2022   HCT 47.0 06/07/2022   MCV 95.3 06/07/2022   PLT 185 06/07/2022     Chemistry      Component Value Date/Time   NA 137 05/31/2022 1420   NA 141 05/16/2018 1425   K 4.1 05/31/2022 1420   CL 106 05/31/2022 1420   CO2 26 05/31/2022 1420   BUN  18 05/31/2022 1420   BUN 13 05/16/2018 1425   CREATININE 1.03 05/31/2022 1420       Component Value Date/Time   CALCIUM 8.5 (L) 05/31/2022 1420   ALKPHOS 39 05/08/2022 1126   AST 14 (L) 05/08/2022 1126   ALT 17 05/08/2022 1126   BILITOT 0.4 05/08/2022 1126       RADIOGRAPHIC STUDIES: I have personally reviewed the radiological images as listed and agreed with the findings in the report. ECHOCARDIOGRAM COMPLETE  Result Date: 05/15/2022    ECHOCARDIOGRAM REPORT   Patient Name:   HASSAAN CRITE Date of Exam: 05/15/2022 Medical Rec #:  [id number]     Height:       76.0 in Accession #:    [id number]    Weight:       214.3 lb Date of Birth:  [birth year]     BSA:          2.281 m Patient Age:    64 years      BP:           131/87 mmHg Patient Gender: M             HR:           63 bpm. Exam Location:  [place] Procedure: 2D Echo, Cardiac Doppler and Color Doppler Indications:    I35.0 AS  History:        Patient has prior history of Echocardiogram examinations, most                 recent 05/31/2020. Dilated aortic root, AS, Arrythmias:Atrial                 Fibrillation; Risk Factors:Current Smoker.  Sonographer:    Coralyn Helling RDCS Referring Phys: [place]  1. Left ventricular ejection fraction, by estimation, is 60 to 65%. The left ventricle has normal function. The left ventricle has no regional wall motion abnormalities. There is mild left ventricular hypertrophy. Left ventricular diastolic parameters are consistent with Grade I diastolic dysfunction (impaired relaxation). Elevated left atrial pressure.  2. Right ventricular systolic function is normal. The right ventricular size is normal.  3. The mitral valve is normal in structure. No evidence of mitral valve regurgitation. No evidence of mitral stenosis. Moderate mitral annular calcification.  4. The aortic valve is calcified. Aortic valve regurgitation is mild. Moderate aortic valve stenosis.  5. Aortic dilatation noted. There is mild dilatation of the aortic root, measuring 43 mm. There is moderate  dilatation of the ascending aorta, measuring 45 mm. FINDINGS  Left Ventricle: Left ventricular ejection fraction, by estimation, is 60 to 65%. The left ventricle has normal function. The left ventricle has no regional wall motion abnormalities. The left ventricular internal cavity size was normal in size. There is  mild left ventricular hypertrophy. Left ventricular diastolic parameters are consistent with Grade I diastolic dysfunction (impaired relaxation). Elevated left atrial pressure. Right Ventricle: The right ventricular size is normal. Right ventricular systolic function is normal. Left Atrium: Left atrial size was normal in size. Right Atrium: Right atrial size was normal in size. Pericardium: There is no evidence of pericardial effusion. Mitral Valve: The mitral valve is normal in structure. Moderate mitral annular calcification. No evidence of mitral valve regurgitation. No evidence of mitral valve stenosis. Tricuspid Valve: The tricuspid valve is normal in structure. Tricuspid valve regurgitation is trivial. No evidence of tricuspid stenosis. Aortic Valve: The aortic valve is calcified. Aortic valve regurgitation is mild. Aortic  regurgitation PHT measures 489 msec. Moderate aortic stenosis is present. Aortic valve mean gradient measures 22.5 mmHg. Aortic valve peak gradient measures 44.6 mmHg. Aortic valve area, by VTI measures 2.93 cm. Pulmonic Valve: The pulmonic valve was not well visualized. Pulmonic valve regurgitation is not visualized. No evidence of pulmonic stenosis. Aorta: Aortic dilatation noted. There is mild dilatation of the aortic root, measuring 43 mm. There is moderate dilatation of the ascending aorta, measuring 45 mm. Venous: The inferior vena cava was not well visualized. IAS/Shunts: The interatrial septum was not well visualized.  LEFT VENTRICLE PLAX 2D LVIDd:         3.60 cm   Diastology LVIDs:         2.50 cm   LV e' medial:    6.42 cm/s LV PW:         1.40 cm   LV E/e' medial:   20.2 LV IVS:        1.50 cm   LV e' lateral:   10.10 cm/s LVOT diam:     2.80 cm   LV E/e' lateral: 12.9 LV SV:         203 LV SV Index:   89 LVOT Area:     6.16 cm  RIGHT VENTRICLE RV S prime:     20.70 cm/s TAPSE (M-mode): 2.2 cm RVSP:           36.6 mmHg LEFT ATRIUM             Index        RIGHT ATRIUM           Index LA diam:        3.40 cm 1.49 cm/m   RA Pressure: 3.00 mmHg LA Vol (A2C):   79.2 ml 34.72 ml/m  RA Area:     16.80 cm LA Vol (A4C):   44.0 ml 19.29 ml/m  RA Volume:   46.70 ml  20.47 ml/m LA Biplane Vol: 62.4 ml 27.35 ml/m  AORTIC VALVE AV Area (Vmax):    2.69 cm AV Area (Vmean):   2.84 cm AV Area (VTI):     2.93 cm AV Vmax:           334.00 cm/s AV Vmean:          216.500 cm/s AV VTI:            0.694 m AV Peak Grad:      44.6 mmHg AV Mean Grad:      22.5 mmHg LVOT Vmax:         146.00 cm/s LVOT Vmean:        100.000 cm/s LVOT VTI:          0.330 m LVOT/AV VTI ratio: 0.48 AI PHT:            489 msec  AORTA Ao Root diam: 4.30 cm Ao Asc diam:  4.50 cm MITRAL VALVE                TRICUSPID VALVE MV Area (PHT): 2.19 cm     TR Peak grad:   33.6 mmHg MV Decel Time: 347 msec     TR Vmax:        290.00 cm/s MV E velocity: 130.00 cm/s  Estimated RAP:  3.00 mmHg MV A velocity: 152.00 cm/s  RVSP:           36.6 mmHg MV E/A ratio:  0.86  SHUNTS                             Systemic VTI:  0.33 m                             Systemic Diam: 2.80 cm Kirk Ruths MD Electronically signed by Kirk Ruths MD Signature Date/Time: 05/15/2022/1:32:57 PM    Final     ASSESSMENT AND PLAN: Cory [name] is a 76 y.o. male who presents to the clinic for a follow up for SCC of supraglottis.  #SCC of supraglottis: --Started concurrent chemoradiation. Radiation was started on 05/30/2022. Weekly cisplatin started on 06/01/2022.  --Patient presents today for a toxicity evaluation before planned Cycle 2 of cisplatin scheduled for tomorrow.  --Labs from today were reviewed and  adequate for treatment tomorrow.  --Patient has no prohibitive toxicities for treatment so can proceed without any dose modifications.  --RTC in one week with labs, f/u visit with Dr. Chryl Heck before cycle 3.   #Sore Throat/Dysphagia: --Awaiting for pharmacy to fill lidocaine suspension that was filled earlier today.  --Mild dysphagia but able to tolerate most foods. Awaiting port/peg placement by IR scheduled next week.   #Lymph node biopsy: --Pathology are concerning for B-cell lymphoproliferative process.  --Dr. Chryl Heck plans to follow up.   All questions were answered. The patient knows to call the clinic with any problems, questions or concerns.  I have spent a total of 30 minutes minutes of face-to-face and non-face-to-face time, preparing to see the patient, performing a medically appropriate examination, counseling and educating the patient, documenting clinical information in the electronic health record, and care coordination.   Dede Query PA-C Dept of Hematology and [place] at [facility] Phone: [phone]

## 2022-06-08 ENCOUNTER — Telehealth: Payer: Self-pay

## 2022-06-08 ENCOUNTER — Other Ambulatory Visit (HOSPITAL_COMMUNITY): Payer: Self-pay

## 2022-06-08 ENCOUNTER — Other Ambulatory Visit (HOSPITAL_COMMUNITY): Payer: Self-pay | Admitting: Student

## 2022-06-08 ENCOUNTER — Ambulatory Visit
Admission: RE | Admit: 2022-06-08 | Discharge: 2022-06-08 | Disposition: A | Discharge status: Home / Self Care | Payer: Medicare HMO | Source: Ambulatory Visit | Attending: Radiation Oncology | Admitting: Radiation Oncology

## 2022-06-08 ENCOUNTER — Inpatient Hospital Stay: Payer: Medicare HMO | Admitting: Dietician

## 2022-06-08 ENCOUNTER — Other Ambulatory Visit: Payer: Self-pay

## 2022-06-08 ENCOUNTER — Encounter: Payer: Self-pay | Admitting: Hematology and Oncology

## 2022-06-08 ENCOUNTER — Inpatient Hospital Stay: Payer: Medicare HMO

## 2022-06-08 VITALS — BP 136/75 | HR 74 | Temp 97.7°F | Resp 18

## 2022-06-08 DIAGNOSIS — Z510 Encounter for antineoplastic radiation therapy: Secondary | ICD-10-CM | POA: Diagnosis not present

## 2022-06-08 DIAGNOSIS — C321 Malignant neoplasm of supraglottis: Secondary | ICD-10-CM

## 2022-06-08 LAB — RAD ONC ARIA SESSION SUMMARY
Course Elapsed Days: 9
Plan Fractions Treated to Date: 8
Plan Prescribed Dose Per Fraction: 2 Gy
Plan Total Fractions Prescribed: 35
Plan Total Prescribed Dose: 70 Gy
Reference Point Dosage Given to Date: 16 Gy
Reference Point Session Dosage Given: 2 Gy
Session Number: 8

## 2022-06-08 MED ORDER — PALONOSETRON HCL INJECTION 0.25 MG/5ML
0.2500 mg | Freq: Once | INTRAVENOUS | Status: AC
  Administered 2022-06-08: 0.25 mg via INTRAVENOUS
  Filled 2022-06-08: qty 5

## 2022-06-08 MED ORDER — SODIUM CHLORIDE 0.9 % IV SOLN
40.0000 mg/m2 | Freq: Once | INTRAVENOUS | Status: AC
  Administered 2022-06-08: 91 mg via INTRAVENOUS
  Filled 2022-06-08: qty 91

## 2022-06-08 MED ORDER — POTASSIUM CHLORIDE IN NACL 20-0.9 MEQ/L-% IV SOLN
Freq: Once | INTRAVENOUS | Status: AC
  Filled 2022-06-08: qty 1000

## 2022-06-08 MED ORDER — SODIUM CHLORIDE 0.9 % IV SOLN: Freq: Once | INTRAVENOUS | Status: AC

## 2022-06-08 MED ORDER — LIDOCAINE VISCOUS HCL 2 % MT SOLN
OROMUCOSAL | 0 refills | Status: DC
  Filled 2022-06-08: qty 200, 10d supply, fill #0

## 2022-06-08 MED ORDER — MAGNESIUM SULFATE 2 GM/50ML IV SOLN
2.0000 g | Freq: Once | INTRAVENOUS | Status: AC
  Administered 2022-06-08: 2 g via INTRAVENOUS
  Filled 2022-06-08: qty 50

## 2022-06-08 MED ORDER — SODIUM CHLORIDE 0.9 % IV SOLN
10.0000 mg | Freq: Once | INTRAVENOUS | Status: AC
  Administered 2022-06-08: 10 mg via INTRAVENOUS
  Filled 2022-06-08: qty 10

## 2022-06-08 MED ORDER — SODIUM CHLORIDE 0.9 % IV SOLN
150.0000 mg | Freq: Once | INTRAVENOUS | Status: AC
  Administered 2022-06-08: 150 mg via INTRAVENOUS
  Filled 2022-06-08: qty 150

## 2022-06-08 NOTE — Progress Notes (Signed)
Nutrition Follow-up:  SCC of supraglottis. Lymph node biopsy revealing atypical lymphocytes concerning for metastatic disease under workup. He is receiving concurrent chemoradiation with weekly cisplatin (first treatment 10/27).   PEG placement r/s for 11/7  Met with patient during infusion. He reports doing well. Patient denies nutrition impact symptoms other than mild sore throat. Patient has found citrus is irritating. He recalls unable to eat apple with skin on as this scratched his throat. Patient endorses good appetite and is eating well. Recalls big breakfast (eggs, avocado, bacon, english muffin, peaches). He had shrimp and grits for dinner last night. Patient reports enjoying a few pineapple/banana milkshakes over the past week. He has not tried samples of nutrition supplements provided. Patient has been unable to fill lidocaine solution d/t shortage per [place]. This will not be available to patient until next week. He is asking if [facility] outpt pharmacy would be able to fill this sooner.   Medications: reviewed  Labs: 11/2 - glucose 103  Anthropometrics: Wt 221 lb 6 oz today stable  10/27 - 220 lb 14.4 oz    Estimated Energy Needs  Kcals: 2500-3000 Protein: 130-150 Fluid: >3L  NUTRITION DIAGNOSIS: Predicted suboptimal intake stable   INTERVENTION:  PEG placement now planned for 11/7  Continue small frequent meals and snacks with adequate calories and protein  Continue high calorie shakes, encouraged pt to try supplement samples - recommend 1-2/day Message sent to Dr. Pearlie Oyster RN regarding xylocaine     MONITORING, EVALUATION, GOAL: weight trends, intake, PEG placement   NEXT VISIT: Friday November 10 during infusion

## 2022-06-08 NOTE — Patient Instructions (Signed)
[facility]  Discharge Instructions: Thank you for choosing [facility] to provide your oncology and hematology care.   If you have a lab appointment with the [facility], please go directly to the [facility] and check in at the registration area.   Wear comfortable clothing and clothing appropriate for easy access to any Portacath or PICC line.   We strive to give you quality time with your provider. You may need to reschedule your appointment if you arrive late (15 or more minutes).  Arriving late affects you and other patients whose appointments are after yours.  Also, if you miss three or more appointments without notifying the office, you may be dismissed from the clinic at the provider's discretion.      For prescription refill requests, have your pharmacy contact our office and allow 72 hours for refills to be completed.    Today you received the following chemotherapy and/or immunotherapy agents: Cisplatin        To help prevent nausea and vomiting after your treatment, we encourage you to take your nausea medication as directed.  BELOW ARE SYMPTOMS THAT SHOULD BE REPORTED IMMEDIATELY: *FEVER GREATER THAN 100.4 F (38 C) OR HIGHER *CHILLS OR SWEATING *NAUSEA AND VOMITING THAT IS NOT CONTROLLED WITH YOUR NAUSEA MEDICATION *UNUSUAL SHORTNESS OF BREATH *UNUSUAL BRUISING OR BLEEDING *URINARY PROBLEMS (pain or burning when urinating, or frequent urination) *BOWEL PROBLEMS (unusual diarrhea, constipation, pain near the anus) TENDERNESS IN MOUTH AND THROAT WITH OR WITHOUT PRESENCE OF ULCERS (sore throat, sores in mouth, or a toothache) UNUSUAL RASH, SWELLING OR PAIN  UNUSUAL VAGINAL DISCHARGE OR ITCHING   Items with * indicate a potential emergency and should be followed up as soon as possible or go to the Emergency Department if any problems should occur.  Please show the CHEMOTHERAPY ALERT CARD or IMMUNOTHERAPY ALERT CARD at check-in  to the Emergency Department and triage nurse.  Should you have questions after your visit or need to cancel or reschedule your appointment, please contact [facility]  Dept: [phone]  and follow the prompts.  Office hours are 8:00 a.m. to 4:30 p.m. Monday - Friday. Please note that voicemails left after 4:00 p.m. may not be returned until the following business day.  We are closed weekends and major holidays. You have access to a nurse at all times for urgent questions. Please call the main number to the clinic Dept: [phone] and follow the prompts.   For any non-urgent questions, you may also contact your provider using MyChart. We now offer e-Visits for anyone 53 and older to request care online for non-urgent symptoms. For details visit [email or web address].   Also download the MyChart app! Go to the app store, search "MyChart", open the app, select [facility], and log in with your MyChart username and password.  Masks are optional in the cancer centers. If you would like for your care team to wear a mask while they are taking care of you, please let them know. You may have one support person who is at least 78 years old accompany you for your appointments. Cisplatin Injection What is this medication? CISPLATIN (SIS pla tin) treats some types of cancer. It works by slowing down the growth of cancer cells. This medicine may be used for other purposes; ask your health care provider or pharmacist if you have questions. COMMON BRAND NAME(S): Platinol, Platinol -AQ What should I tell my care team before I take  this medication? They need to know if you have any of these conditions: Eye disease, vision problems Hearing problems Kidney disease Low blood counts, such as low white cells, platelets, or red blood cells Tingling of the fingers or toes, or other nerve disorder An unusual or allergic reaction to cisplatin, carboplatin, oxaliplatin, other  medications, foods, dyes, or preservatives If you or your partner are pregnant or trying to get pregnant Breast-feeding How should I use this medication? This medication is injected into a vein. It is given by your care team in a hospital or clinic setting. Talk to your care team about the use of this medication in children. Special care may be needed. Overdosage: If you think you have taken too much of this medicine contact a poison control center or emergency room at once. NOTE: This medicine is only for you. Do not share this medicine with others. What if I miss a dose? Keep appointments for follow-up doses. It is important not to miss your dose. Call your care team if you are unable to keep an appointment. What may interact with this medication? Do not take this medication with any of the following: Live virus vaccines This medication may also interact with the following: Certain antibiotics, such as amikacin, gentamicin, neomycin, polymyxin B, streptomycin, tobramycin, vancomycin Foscarnet This list may not describe all possible interactions. Give your health care provider a list of all the medicines, herbs, non-prescription drugs, or dietary supplements you use. Also tell them if you smoke, drink alcohol, or use illegal drugs. Some items may interact with your medicine. What should I watch for while using this medication? Your condition will be monitored carefully while you are receiving this medication. You may need blood work done while taking this medication. This medication may make you feel generally unwell. This is not uncommon, as chemotherapy can affect healthy cells as well as cancer cells. Report any side effects. Continue your course of treatment even though you feel ill unless your care team tells you to stop. This medication may increase your risk of getting an infection. Call your care team for advice if you get a fever, chills, sore throat, or other symptoms of a cold or flu.  Do not treat yourself. Try to avoid being around people who are sick. Avoid taking medications that contain aspirin, acetaminophen, ibuprofen, naproxen, or ketoprofen unless instructed by your care team. These medications may hide a fever. This medication may increase your risk to bruise or bleed. Call your care team if you notice any unusual bleeding. Be careful brushing or flossing your teeth or using a toothpick because you may get an infection or bleed more easily. If you have any dental work done, tell your dentist you are receiving this medication. Drink fluids as directed while you are taking this medication. This will help protect your kidneys. Call your care team if you get diarrhea. Do not treat yourself. Talk to your care team if you or your partner wish to become pregnant or think you might be pregnant. This medication can cause serious birth defects if taken during pregnancy and for 14 months after the last dose. A negative pregnancy test is required before starting this medication. A reliable form of contraception is recommended while taking this medication and for 14 months after the last dose. Talk to your care team about effective forms of contraception. Do not father a child while taking this medication and for 11 months after the last dose. Use a condom during sex during  this time period. Do not breast-feed while taking this medication. This medication may cause infertility. Talk to your care team if you are concerned about your fertility. What side effects may I notice from receiving this medication? Side effects that you should report to your care team as soon as possible: Allergic reactions--skin rash, itching, hives, swelling of the face, lips, tongue, or throat Eye pain, change in vision, vision loss Hearing loss, ringing in ears Infection--fever, chills, cough, sore throat, wounds that don't heal, pain or trouble when passing urine, general feeling of discomfort or being  unwell Kidney injury--decrease in the amount of urine, swelling of the ankles, hands, or feet Low red blood cell level--unusual weakness or fatigue, dizziness, headache, trouble breathing Painful swelling, warmth, or redness of the skin, blisters or sores at the infusion site Pain, tingling, or numbness in the hands or feet Unusual bruising or bleeding Side effects that usually do not require medical attention (report to your care team if they continue or are bothersome): Hair loss Nausea Vomiting This list may not describe all possible side effects. Call your doctor for medical advice about side effects. You may report side effects to FDA at 1-800-FDA-1088. Where should I keep my medication? This medication is given in a hospital or clinic. It will not be stored at home. NOTE: This sheet is a summary. It may not cover all possible information. If you have questions about this medicine, talk to your doctor, pharmacist, or health care provider.  2023 [employer] Standard (2021-11-17 00:00:00)

## 2022-06-08 NOTE — Telephone Encounter (Signed)
Notified that patient's viscous lidocaine prescription that was sent to [facility] would not be available till next Tuesday 11/7. Called and spoke with pharmacist at [facility] who stated they had medication in stock. Provided verbal orders for previous prescription to be transferred over to [facility].

## 2022-06-11 ENCOUNTER — Ambulatory Visit: Payer: Medicare HMO

## 2022-06-11 ENCOUNTER — Ambulatory Visit
Admission: RE | Admit: 2022-06-11 | Discharge: 2022-06-11 | Disposition: A | Discharge status: Home / Self Care | Payer: Medicare HMO | Source: Ambulatory Visit | Attending: Radiation Oncology | Admitting: Radiation Oncology

## 2022-06-11 ENCOUNTER — Other Ambulatory Visit: Payer: Self-pay

## 2022-06-11 DIAGNOSIS — C321 Malignant neoplasm of supraglottis: Secondary | ICD-10-CM | POA: Diagnosis not present

## 2022-06-11 DIAGNOSIS — Z510 Encounter for antineoplastic radiation therapy: Secondary | ICD-10-CM | POA: Diagnosis not present

## 2022-06-11 LAB — RAD ONC ARIA SESSION SUMMARY
Course Elapsed Days: 12
Plan Fractions Treated to Date: 9
Plan Prescribed Dose Per Fraction: 2 Gy
Plan Total Fractions Prescribed: 35
Plan Total Prescribed Dose: 70 Gy
Reference Point Dosage Given to Date: 18 Gy
Reference Point Session Dosage Given: 2 Gy
Session Number: 9

## 2022-06-12 ENCOUNTER — Ambulatory Visit (HOSPITAL_COMMUNITY)
Admission: RE | Admit: 2022-06-12 | Discharge: 2022-06-12 | Disposition: A | Discharge status: Home / Self Care | Payer: Medicare HMO | Source: Ambulatory Visit | Attending: Hematology and Oncology | Admitting: Hematology and Oncology

## 2022-06-12 ENCOUNTER — Other Ambulatory Visit: Payer: Self-pay

## 2022-06-12 ENCOUNTER — Telehealth: Payer: Medicare HMO

## 2022-06-12 ENCOUNTER — Ambulatory Visit
Admission: RE | Admit: 2022-06-12 | Discharge: 2022-06-12 | Disposition: A | Discharge status: Home / Self Care | Payer: Medicare HMO | Source: Ambulatory Visit | Attending: Radiation Oncology | Admitting: Radiation Oncology

## 2022-06-12 ENCOUNTER — Encounter (HOSPITAL_COMMUNITY): Payer: Self-pay

## 2022-06-12 DIAGNOSIS — C321 Malignant neoplasm of supraglottis: Secondary | ICD-10-CM | POA: Insufficient documentation

## 2022-06-12 DIAGNOSIS — R1310 Dysphagia, unspecified: Secondary | ICD-10-CM | POA: Diagnosis not present

## 2022-06-12 DIAGNOSIS — Z452 Encounter for adjustment and management of vascular access device: Secondary | ICD-10-CM | POA: Diagnosis not present

## 2022-06-12 DIAGNOSIS — Z510 Encounter for antineoplastic radiation therapy: Secondary | ICD-10-CM | POA: Diagnosis not present

## 2022-06-12 DIAGNOSIS — F17210 Nicotine dependence, cigarettes, uncomplicated: Secondary | ICD-10-CM | POA: Insufficient documentation

## 2022-06-12 DIAGNOSIS — C760 Malignant neoplasm of head, face and neck: Secondary | ICD-10-CM | POA: Diagnosis not present

## 2022-06-12 HISTORY — PX: IR IMAGING GUIDED PORT INSERTION: IMG5740

## 2022-06-12 HISTORY — PX: IR GASTROSTOMY TUBE MOD SED: IMG625

## 2022-06-12 LAB — RAD ONC ARIA SESSION SUMMARY
Course Elapsed Days: 13
Plan Fractions Treated to Date: 10
Plan Prescribed Dose Per Fraction: 2 Gy
Plan Total Fractions Prescribed: 35
Plan Total Prescribed Dose: 70 Gy
Reference Point Dosage Given to Date: 20 Gy
Reference Point Session Dosage Given: 2 Gy
Session Number: 10

## 2022-06-12 LAB — CBC
HCT: 42.8 % (ref 39.0–52.0)
Hemoglobin: 14.8 g/dL (ref 13.0–17.0)
MCH: 33.3 pg (ref 26.0–34.0)
MCHC: 34.6 g/dL (ref 30.0–36.0)
MCV: 96.2 fL (ref 80.0–100.0)
Platelets: 147 10*3/uL — ABNORMAL LOW (ref 150–400)
RBC: 4.45 MIL/uL (ref 4.22–5.81)
RDW: 13.4 % (ref 11.5–15.5)
WBC: 9.1 10*3/uL (ref 4.0–10.5)
nRBC: 0 % (ref 0.0–0.2)

## 2022-06-12 LAB — BASIC METABOLIC PANEL
Anion gap: 6 (ref 5–15)
BUN: 19 mg/dL (ref 8–23)
CO2: 28 mmol/L (ref 22–32)
Calcium: 8.6 mg/dL — ABNORMAL LOW (ref 8.9–10.3)
Chloride: 104 mmol/L (ref 98–111)
Creatinine, Ser: 0.75 mg/dL (ref 0.61–1.24)
GFR, Estimated: >60 mL/min (ref >60)
Glucose, Bld: 95 mg/dL (ref 70–99)
Potassium: 3.8 mmol/L (ref 3.5–5.1)
Sodium: 138 mmol/L (ref 135–145)

## 2022-06-12 LAB — PROTIME-INR
INR: 0.9 (ref 0.8–1.2)
Prothrombin Time: 12.5 seconds (ref 11.4–15.2)

## 2022-06-12 MED ORDER — IOHEXOL 300 MG/ML  SOLN
50.0000 mL | Freq: Once | INTRAMUSCULAR | Status: AC | PRN
  Administered 2022-06-12: 15 mL

## 2022-06-12 MED ORDER — MIDAZOLAM HCL 2 MG/2ML IJ SOLN
INTRAMUSCULAR | Status: AC | PRN
  Administered 2022-06-12 (×2): .5 mg via INTRAVENOUS
  Administered 2022-06-12: 1 mg via INTRAVENOUS

## 2022-06-12 MED ORDER — GLUCAGON HCL RDNA (DIAGNOSTIC) 1 MG IJ SOLR
INTRAMUSCULAR | Status: AC
  Filled 2022-06-12: qty 1

## 2022-06-12 MED ORDER — HYDROCODONE-ACETAMINOPHEN 5-325 MG PO TABS
1.0000 | ORAL_TABLET | ORAL | Status: DC | PRN
  Administered 2022-06-12: 1 via ORAL
  Filled 2022-06-12: qty 1

## 2022-06-12 MED ORDER — ONDANSETRON HCL 4 MG/2ML IJ SOLN: 4.0000 mg | INTRAMUSCULAR | Status: DC | PRN

## 2022-06-12 MED ORDER — HYDROMORPHONE HCL 1 MG/ML IJ SOLN: 1.0000 mg | INTRAMUSCULAR | Status: DC | PRN

## 2022-06-12 MED ORDER — MIDAZOLAM HCL 2 MG/2ML IJ SOLN
INTRAMUSCULAR | Status: AC
  Filled 2022-06-12: qty 2

## 2022-06-12 MED ORDER — CEFAZOLIN SODIUM-DEXTROSE 2-4 GM/100ML-% IV SOLN
INTRAVENOUS | Status: AC
  Administered 2022-06-12: 2 g via INTRAVENOUS
  Filled 2022-06-12: qty 100

## 2022-06-12 MED ORDER — LIDOCAINE HCL 1 % IJ SOLN
INTRAMUSCULAR | Status: AC
  Filled 2022-06-12: qty 20

## 2022-06-12 MED ORDER — SODIUM CHLORIDE 0.9 % IV SOLN: INTRAVENOUS | Status: DC

## 2022-06-12 MED ORDER — FENTANYL CITRATE (PF) 100 MCG/2ML IJ SOLN
INTRAMUSCULAR | Status: AC | PRN
  Administered 2022-06-12 (×4): 25 ug via INTRAVENOUS

## 2022-06-12 MED ORDER — LIDOCAINE VISCOUS HCL 2 % MT SOLN
OROMUCOSAL | Status: AC
  Administered 2022-06-12: 5 mL
  Filled 2022-06-12: qty 15

## 2022-06-12 MED ORDER — LIDOCAINE-EPINEPHRINE 1 %-1:100000 IJ SOLN
INTRAMUSCULAR | Status: AC
  Administered 2022-06-12: 12 mL
  Filled 2022-06-12: qty 1

## 2022-06-12 MED ORDER — GLUCAGON HCL (RDNA) 1 MG IJ SOLR
INTRAMUSCULAR | Status: AC | PRN
  Administered 2022-06-12: 1 mg via INTRAVENOUS

## 2022-06-12 MED ORDER — CEFAZOLIN SODIUM-DEXTROSE 2-4 GM/100ML-% IV SOLN: 2.0000 g | INTRAVENOUS | Status: AC

## 2022-06-12 MED ORDER — HEPARIN SOD (PORK) LOCK FLUSH 100 UNIT/ML IV SOLN
INTRAVENOUS | Status: AC
  Administered 2022-06-12: 500 [IU]
  Filled 2022-06-12: qty 5

## 2022-06-12 MED ORDER — LIDOCAINE VISCOUS HCL 2 % MT SOLN
OROMUCOSAL | Status: AC
  Filled 2022-06-12: qty 15

## 2022-06-12 MED ORDER — FENTANYL CITRATE (PF) 100 MCG/2ML IJ SOLN
INTRAMUSCULAR | Status: AC
  Filled 2022-06-12: qty 2

## 2022-06-12 NOTE — Progress Notes (Signed)
G-Tube connected to intermittent low wall suction for two hours per order. 30 cc of clear fluid emptied from cannister.

## 2022-06-12 NOTE — H&P (Addendum)
Chief Complaint: Patient was seen in consultation today for percutaneous gastric tube placement and Port a cath placement at the request of [place]  Referring Physician(s): Iruku,Praveena  Supervising Physician: Jacqulynn Cadet  Patient Status: [facility] - Out-pt  History of Present Illness: Cory [name] is a 76 y.o. male   Head and neck cancer Follows with Dr Chryl Heck Dysphagia Has started chemo 2 weeks ago- needs access for ongoing chemo And placement of G tube for dysphagia  Last dose Xarelto Sat pm    Past Medical History:  Diagnosis Date   Aortic stenosis    a. Moderate by echo 05/2020.   Arthritis    right hip   Cancer ([place])    skin cancer nose, right left, upper left basel cell   Dysrhythmia    a-fib   PAF (paroxysmal atrial fibrillation) (HCC)    Peripheral arterial disease (HCC)    Tobacco abuse     Past Surgical History:  Procedure Laterality Date   ABDOMINAL AORTIC ENDOVASCULAR STENT GRAFT N/A 08/29/2020   Procedure: ABDOMINAL AORTIC ENDOVASCULAR STENT GRAFT;  Surgeon: Elam Dutch, MD;  Location: [place];  Service: Vascular;  Laterality: N/A;   ABDOMINAL AORTOGRAM W/LOWER EXTREMITY Bilateral 06/24/2020   Procedure: ABDOMINAL AORTOGRAM W/LOWER EXTREMITY;  Surgeon: Elam Dutch, MD;  Location: [place] [facility];  Service: Cardiovascular;  Laterality: Bilateral;   BACK SURGERY     2005   BIOPSY  05/21/2022   Procedure: BIOPSY;  Surgeon: Rush Landmark Telford Nab., MD;  Location: [facility];  Service: Gastroenterology;;   CERVICAL SPINE SURGERY     DIRECT LARYNGOSCOPY Bilateral 04/18/2022   Procedure: DIRECT LARYNGOSCOPY WITH BIOPSIES;  Surgeon: Melida Quitter, MD;  Location: [place];  Service: ENT;  Laterality: Bilateral;   EMBOLECTOMY Right 08/29/2020   Procedure: POPLITEAL AND TIBIAL EMBOLECTOMY;  Surgeon: Elam Dutch, MD;  Location: [place];  Service: Vascular;  Laterality: Right;   EMBOLIZATION Right 06/24/2020   Procedure:  EMBOLIZATION;  Surgeon: Elam Dutch, MD;  Location: [place] [facility];  Service: Cardiovascular;  Laterality: Right;   ENDARTERECTOMY FEMORAL Right 08/29/2020   Procedure: ENDARTERECTOMY FEMORAL;  Surgeon: Elam Dutch, MD;  Location: [facility] OR;  Service: Vascular;  Laterality: Right;   ESOPHAGOGASTRODUODENOSCOPY (EGD) WITH PROPOFOL N/A 05/21/2022   Procedure: ESOPHAGOGASTRODUODENOSCOPY (EGD) WITH PROPOFOL;  Surgeon: Irving Copas., MD;  Location: [facility];  Service: Gastroenterology;  Laterality: N/A;   EUS N/A 05/21/2022   Procedure: UPPER ENDOSCOPIC ULTRASOUND (EUS) RADIAL;  Surgeon: Irving Copas., MD;  Location: [facility];  Service: Gastroenterology;  Laterality: N/A;   EYE SURGERY     FEMORAL ARTERY EXPLORATION Right 06/25/2020   Procedure: RIGHT GROIN EXPLORATION Evacuation of Hematoma  WITH REPAIR OF RIGHT Common FEMORAL ARTERY.;  Surgeon: Angelia Mould, MD;  Location: [place];  Service: Vascular;  Laterality: Right;   FEMORAL-POPLITEAL BYPASS GRAFT Left 11/22/2020   Procedure: LEFT ABOVE KNEE-BELOW KNEE BYPASS TIBIAL PERITONEAL TRUNK REVERSE IPSILATERAL GREATER SAPHENOUS VEIN;  Surgeon: Elam Dutch, MD;  Location: [facility] OR;  Service: Vascular;  Laterality: Left;   FINE NEEDLE ASPIRATION N/A 05/21/2022   Procedure: FINE NEEDLE ASPIRATION (FNA) LINEAR;  Surgeon: Irving Copas., MD;  Location: [facility];  Service: Gastroenterology;  Laterality: N/A;   LEG SURGERY     Trauma   LOWER EXTREMITY ANGIOGRAPHY Left 11/17/2020   Procedure: Lower Extremity Angiography;  Surgeon: Marty Heck, MD;  Location: [place] [facility];  Service: Cardiovascular;  Laterality: Left;  PATCH ANGIOPLASTY Right 08/29/2020   Procedure: PATCH ANGIOPLASTY RIGHT BELOW KNEE POPLITEAL ARTERY AND RIGHT COMMON FEMORAL ARTERY;  Surgeon: Elam Dutch, MD;  Location: [place];  Service: Vascular;  Laterality: Right;   RETINAL DETACHMENT SURGERY     RIGID  ESOPHAGOSCOPY  04/18/2022   Procedure: RIGID ESOPHAGOSCOPY;  Surgeon: Melida Quitter, MD;  Location: [place];  Service: ENT;;   TONSILLECTOMY  04/18/2022   Procedure: LEFT TONSILLECTOMY;  Surgeon: Melida Quitter, MD;  Location: [place];  Service: ENT;;   ULTRASOUND GUIDANCE FOR VASCULAR ACCESS Bilateral 08/29/2020   Procedure: ULTRASOUND GUIDANCE FOR VASCULAR ACCESS;  Surgeon: Elam Dutch, MD;  Location: [facility] OR;  Service: Vascular;  Laterality: Bilateral;    Allergies: Zithromax [azithromycin]  Medications: Prior to Admission medications   Medication Sig Start Date End Date Taking? Authorizing Provider  acetaminophen (TYLENOL) 500 MG tablet Take 500 mg by mouth every 6 (six) hours as needed (for pain.).    [provider]  cetirizine (ZYRTEC) 10 MG tablet Take 1 tablet (10 mg total) by mouth daily. Patient taking differently: Take 10 mg by mouth daily as needed for allergies. 12/10/16   Jaynee Eagles, PA-C  cholecalciferol (VITAMIN D) 25 MCG (1000 UNIT) tablet Take 1,000 Units by mouth 2 (two) times daily.    [provider]  dexamethasone (DECADRON) 4 MG tablet Take 2 tablets daily x 3 days starting the day after cisplatin chemotherapy. Take with food. 05/25/22   Benay Pike, MD  diltiazem (TIADYLT ER) 120 MG 24 hr capsule TAKE 1 CAPSULE EVERY DAY (SCHEDULE OFFICE VISIT FOR FUTURE REFILLS) 03/09/22   [name], MD  lidocaine (XYLOCAINE) 2 % solution Patient: Mix 1part 2% viscous lidocaine, 1part H20. Swish & swallow 86m of diluted mixture, 376m before meals  and at bedtime, up to QID prn sore throat. 06/04/22   SqEppie GibsonMD  lidocaine (XYLOCAINE) 2 % solution Mix 1 part lidocaine with 1 part water, then swish and swallow 10 ml of mixture 30 minutes before meals and at bedtime up to 4 times daily as needed for sore throat 06/08/22   SqEppie GibsonMD  lidocaine-prilocaine (EMLA) cream Apply to affected area once 05/25/22   IrBenay PikeMD  metoprolol tartrate  (LOPRESSOR) 25 MG tablet Take 1 tablet (25 mg total) by mouth 2 (two) times daily as needed. Patient not taking: Reported on 06/07/2022 04/12/22   LaWarren LacyPA-C  nicotine (NICODERM CQ - DOSED IN MG/24 HOURS) 21 mg/24hr patch Place 1 patch (21 mg total) onto the skin daily. Apply 21 mg patch daily x 6 wk, then '14mg'$  patch daily x 2 wk, then 7 mg patch daily x 2 wk 05/08/22   SqEppie GibsonMD  omeprazole (PRILOSEC) 40 MG capsule Take 1 capsule (40 mg total) by mouth daily. 05/21/22   Mansouraty, GaTelford Nab MD  ondansetron (ZOFRAN) 8 MG tablet Take 1 tablet (8 mg total) by mouth every 8 (eight) hours as needed for nausea or vomiting. Start on the third day after cisplatin. 05/25/22   IrBenay PikeMD  prochlorperazine (COMPAZINE) 10 MG tablet Take 1 tablet (10 mg total) by mouth every 6 (six) hours as needed (Nausea or vomiting). 05/25/22   IrBenay PikeMD  rivaroxaban (XARELTO) 20 MG TABS tablet Take 1 tablet (20 mg total) by mouth daily with supper. 05/23/22   Mansouraty, GaTelford Nab MD  rosuvastatin (CRESTOR) 10 MG tablet Take 1 tablet (10 mg total) by mouth daily. 05/18/22   Burchette, BrDarnell Level  W, MD  sodium chloride (OCEAN) 0.65 % SOLN nasal spray Place 1 spray into both nostrils as needed for congestion.    [provider]     Family History  Problem Relation Age of Onset   Ovarian cancer Mother    Alcoholism Father    Breast cancer Sister    Ovarian cancer Sister     Social History   Socioeconomic History   Marital status: Married    Spouse name: Not on file   Number of children: 5   Years of education: Not on file   Highest education level: Master's degree (e.g., MA, MS, MEng, MEd, MSW, MBA)  Occupational History   Not on file  Tobacco Use   Smoking status: Every Day    Packs/day: 0.50    Years: 50.00    Total pack years: 25.00    Types: Cigarettes    Passive exposure: Never   Smokeless tobacco: Never  Vaping Use   Vaping Use: Never used  Substance  and Sexual Activity   Alcohol use: Yes    Alcohol/week: 0.0 standard drinks of alcohol    Comment: 4 to 5 times a week; beer, wine and liquor    Drug use: No   Sexual activity: Not Currently  Other Topics Concern   Not on file  Social History Narrative   Scores reading tests.  Lives with wife.     Social Determinants of Health   Financial Resource Strain: Low Risk  (05/24/2022)   Overall Financial Resource Strain (CARDIA)    Difficulty of Paying Living Expenses: Not hard at all  Food Insecurity: No Food Insecurity (05/24/2022)   Hunger Vital Sign    Worried About Running Out of Food in the Last Year: Never true    Ran Out of Food in the Last Year: Never true  Transportation Needs: No Transportation Needs (05/24/2022)   PRAPARE - Hydrologist (Medical): No    Lack of Transportation (Non-Medical): No  Physical Activity: Insufficiently Active (01/08/2022)   Exercise Vital Sign    Days of Exercise per Week: 3 days    Minutes of Exercise per Session: 30 min  Stress: No Stress Concern Present (01/08/2022)   [place]    Feeling of Stress : Not at all  Social Connections: Moderately Isolated (01/08/2022)   Social Connection and Isolation Panel [NHANES]    Frequency of Communication with Friends and Family: More than three times a week    Frequency of Social Gatherings with Friends and Family: Three times a week    Attends Religious Services: Never    Active Member of Clubs or Organizations: No    Attends Music therapist: Not on file    Marital Status: Married   Review of Systems: A 12 point ROS discussed and pertinent positives are indicated in the HPI above.  All other systems are negative.  Review of Systems  Constitutional:  Negative for activity change, fatigue and fever.  HENT:  Positive for sore throat.   Respiratory:  Negative for cough and shortness of breath.    Cardiovascular:  Negative for chest pain.  Gastrointestinal:  Negative for abdominal pain.  Neurological:  Negative for weakness.  Psychiatric/Behavioral:  Negative for behavioral problems and confusion.     Vital Signs: BP (!) 154/83   Pulse (!) 52   Temp (!) 97.1 F (36.2 C) (Temporal)   Resp 18   Ht 6'  3" (1.905 m)   Wt 222 lb (100.7 kg)   SpO2 97%   BMI 27.75 kg/m   Physical Exam Vitals reviewed.  HENT:     Mouth/Throat:     Mouth: Mucous membranes are moist.  Cardiovascular:     Rate and Rhythm: Normal rate and regular rhythm.     Heart sounds: Normal heart sounds.  Pulmonary:     Effort: Pulmonary effort is normal.     Breath sounds: Normal breath sounds.  Abdominal:     Palpations: Abdomen is soft.  Musculoskeletal:        General: Normal range of motion.  Skin:    General: Skin is warm.  Neurological:     Mental Status: He is alert and oriented to person, place, and time.  Psychiatric:        Behavior: Behavior normal.     Imaging: ECHOCARDIOGRAM COMPLETE  Result Date: 05/15/2022    ECHOCARDIOGRAM REPORT   Patient Name:   Cory [name] Date of Exam: 05/15/2022 Medical Rec #:  [id number]     Height:       76.0 in Accession #:    [id number]    Weight:       214.3 lb Date of Birth:  February 25, 1946     BSA:          2.281 m Patient Age:    51 years      BP:           131/87 mmHg Patient Gender: M             HR:           63 bpm. Exam Location:  [place] Procedure: 2D Echo, Cardiac Doppler and Color Doppler Indications:    I35.0 AS  History:        Patient has prior history of Echocardiogram examinations, most                 recent 05/31/2020. Dilated aortic root, AS, Arrythmias:Atrial                 Fibrillation; Risk Factors:Current Smoker.  Sonographer:    Coralyn Helling RDCS Referring Phys: [place]  1. Left ventricular ejection fraction, by estimation, is 60 to 65%. The left ventricle has normal function. The left ventricle has no  regional wall motion abnormalities. There is mild left ventricular hypertrophy. Left ventricular diastolic parameters are consistent with Grade I diastolic dysfunction (impaired relaxation). Elevated left atrial pressure.  2. Right ventricular systolic function is normal. The right ventricular size is normal.  3. The mitral valve is normal in structure. No evidence of mitral valve regurgitation. No evidence of mitral stenosis. Moderate mitral annular calcification.  4. The aortic valve is calcified. Aortic valve regurgitation is mild. Moderate aortic valve stenosis.  5. Aortic dilatation noted. There is mild dilatation of the aortic root, measuring 43 mm. There is moderate dilatation of the ascending aorta, measuring 45 mm. FINDINGS  Left Ventricle: Left ventricular ejection fraction, by estimation, is 60 to 65%. The left ventricle has normal function. The left ventricle has no regional wall motion abnormalities. The left ventricular internal cavity size was normal in size. There is  mild left ventricular hypertrophy. Left ventricular diastolic parameters are consistent with Grade I diastolic dysfunction (impaired relaxation). Elevated left atrial pressure. Right Ventricle: The right ventricular size is normal. Right ventricular systolic function is normal. Left Atrium: Left atrial size was normal in size. Right Atrium: Right  atrial size was normal in size. Pericardium: There is no evidence of pericardial effusion. Mitral Valve: The mitral valve is normal in structure. Moderate mitral annular calcification. No evidence of mitral valve regurgitation. No evidence of mitral valve stenosis. Tricuspid Valve: The tricuspid valve is normal in structure. Tricuspid valve regurgitation is trivial. No evidence of tricuspid stenosis. Aortic Valve: The aortic valve is calcified. Aortic valve regurgitation is mild. Aortic regurgitation PHT measures 489 msec. Moderate aortic stenosis is present. Aortic valve mean gradient measures  22.5 mmHg. Aortic valve peak gradient measures 44.6 mmHg. Aortic valve area, by VTI measures 2.93 cm. Pulmonic Valve: The pulmonic valve was not well visualized. Pulmonic valve regurgitation is not visualized. No evidence of pulmonic stenosis. Aorta: Aortic dilatation noted. There is mild dilatation of the aortic root, measuring 43 mm. There is moderate dilatation of the ascending aorta, measuring 45 mm. Venous: The inferior vena cava was not well visualized. IAS/Shunts: The interatrial septum was not well visualized.  LEFT VENTRICLE PLAX 2D LVIDd:         3.60 cm   Diastology LVIDs:         2.50 cm   LV e' medial:    6.42 cm/s LV PW:         1.40 cm   LV E/e' medial:  20.2 LV IVS:        1.50 cm   LV e' lateral:   10.10 cm/s LVOT diam:     2.80 cm   LV E/e' lateral: 12.9 LV SV:         203 LV SV Index:   89 LVOT Area:     6.16 cm  RIGHT VENTRICLE RV S prime:     20.70 cm/s TAPSE (M-mode): 2.2 cm RVSP:           36.6 mmHg LEFT ATRIUM             Index        RIGHT ATRIUM           Index LA diam:        3.40 cm 1.49 cm/m   RA Pressure: 3.00 mmHg LA Vol (A2C):   79.2 ml 34.72 ml/m  RA Area:     16.80 cm LA Vol (A4C):   44.0 ml 19.29 ml/m  RA Volume:   46.70 ml  20.47 ml/m LA Biplane Vol: 62.4 ml 27.35 ml/m  AORTIC VALVE AV Area (Vmax):    2.69 cm AV Area (Vmean):   2.84 cm AV Area (VTI):     2.93 cm AV Vmax:           334.00 cm/s AV Vmean:          216.500 cm/s AV VTI:            0.694 m AV Peak Grad:      44.6 mmHg AV Mean Grad:      22.5 mmHg LVOT Vmax:         146.00 cm/s LVOT Vmean:        100.000 cm/s LVOT VTI:          0.330 m LVOT/AV VTI ratio: 0.48 AI PHT:            489 msec  AORTA Ao Root diam: 4.30 cm Ao Asc diam:  4.50 cm MITRAL VALVE                TRICUSPID VALVE MV Area (PHT): 2.19 cm     TR Peak grad:  33.6 mmHg MV Decel Time: 347 msec     TR Vmax:        290.00 cm/s MV E velocity: 130.00 cm/s  Estimated RAP:  3.00 mmHg MV A velocity: 152.00 cm/s  RVSP:           36.6 mmHg MV E/A ratio:   0.86                             SHUNTS                             Systemic VTI:  0.33 m                             Systemic Diam: 2.80 cm Kirk Ruths MD Electronically signed by Kirk Ruths MD Signature Date/Time: 05/15/2022/1:32:57 PM    Final     Labs:  CBC: Recent Labs    04/04/22 0720 05/08/22 1126 05/31/22 1420 06/07/22 0812  WBC 6.0 6.2 8.5 6.1  HGB 15.0 15.7 15.3 16.2  HCT 45.4 45.8 44.6 47.0  PLT 176 203 203 185    COAGS: No results for input(s): "INR", "APTT" in the last 8760 hours.  BMP: Recent Labs    04/04/22 0720 05/08/22 1126 05/31/22 1420 06/07/22 0812  NA 137 138 137 139  K 4.1 4.3 4.1 4.0  CL 103 107 106 102  CO2 '23 28 26 '$ 33*  GLUCOSE 96 105* 96 103*  BUN '13 12 18 17  '$ CALCIUM 8.7* 9.0 8.5* 9.3  CREATININE 0.94 0.86 1.03 0.95  GFRNONAA >60 >60 >60 >60    LIVER FUNCTION TESTS: Recent Labs    01/09/22 0934 05/08/22 1126  BILITOT 0.7 0.4  AST 16 14*  ALT 19 17  ALKPHOS 43 39  PROT 6.7 6.1*  ALBUMIN 3.8 3.7    TUMOR MARKERS: No results for input(s): "AFPTM", "CEA", "CA199", "CHROMGRNA" in the last 8760 hours.  Assessment and Plan:  Head/neck cancer Dysphagia Ongoing chemo therapy Scheduled today for percutaneous gastric tube placement and Port a cath placement Risks and benefits image guided gastrostomy tube placement was discussed with the patient including, but not limited to the need for a barium enema during the procedure, bleeding, infection, peritonitis and/or damage to adjacent structures. All of the patient's questions were answered, patient is agreeable to proceed. Consent signed and in chart.  Risks and benefits of image guided port-a-catheter placement was discussed with the patient including, but not limited to bleeding, infection, pneumothorax, or fibrin sheath development and need for additional procedures. All of the patient's questions were answered, patient is agreeable to proceed. Consent signed and in  chart.  Thank you for this interesting consult.  I greatly enjoyed meeting SAGAR TENGAN and look forward to participating in their care.  A copy of this report was sent to the requesting provider on this date.  Electronically Signed: Lavonia Drafts, PA-C 06/12/2022, 9:49 AM   I spent a total of  30 Minutes   in face to face in clinical consultation, greater than 50% of which was counseling/coordinating care for [facility] and G tube placement

## 2022-06-12 NOTE — Discharge Instructions (Addendum)
Flush tube with 20 cc of sterile water every 24 hours. Cleanse gastric tube site with soap and water, apply triple antibiotic cream, and cover with gauze every 24 hours for 7 days. Nothing to eat or drink (except ice chips) for 24 hours-until 1:00 p.m. tomorrow 11/8.

## 2022-06-12 NOTE — Procedures (Signed)
Interventional Radiology Procedure Note  Procedure: Placement of percutaneous 23F pull-through gastrostomy tube. Complications: None Recommendations: - NPO except for sips and chips remainder of today and overnight - Maintain G-tube to LWS x 2 hrs - May advance diet as tolerated and begin using tube tomorrow morning   Interventional Radiology Procedure Note  Procedure: Placement of a right IJ approach single lumen PowerPort.  Tip is positioned at the superior cavoatrial junction and catheter is ready for immediate use.  Complications: No immediate Recommendations:  - Ok to shower tomorrow - Do not submerge for 7 days - Routine line care   Signed,  Criselda Peaches, MD

## 2022-06-13 ENCOUNTER — Other Ambulatory Visit: Payer: Self-pay

## 2022-06-13 ENCOUNTER — Ambulatory Visit
Admission: RE | Admit: 2022-06-13 | Discharge: 2022-06-13 | Disposition: A | Discharge status: Home / Self Care | Payer: Medicare HMO | Source: Ambulatory Visit | Attending: Radiation Oncology | Admitting: Radiation Oncology

## 2022-06-13 DIAGNOSIS — Z510 Encounter for antineoplastic radiation therapy: Secondary | ICD-10-CM | POA: Diagnosis not present

## 2022-06-13 DIAGNOSIS — C321 Malignant neoplasm of supraglottis: Secondary | ICD-10-CM | POA: Diagnosis not present

## 2022-06-13 LAB — RAD ONC ARIA SESSION SUMMARY
Course Elapsed Days: 14
Plan Fractions Treated to Date: 11
Plan Prescribed Dose Per Fraction: 2 Gy
Plan Total Fractions Prescribed: 35
Plan Total Prescribed Dose: 70 Gy
Reference Point Dosage Given to Date: 22 Gy
Reference Point Session Dosage Given: 2 Gy
Session Number: 11

## 2022-06-14 ENCOUNTER — Inpatient Hospital Stay: Payer: Medicare HMO

## 2022-06-14 ENCOUNTER — Encounter: Payer: Self-pay | Admitting: Hematology and Oncology

## 2022-06-14 ENCOUNTER — Other Ambulatory Visit: Payer: Self-pay

## 2022-06-14 ENCOUNTER — Ambulatory Visit
Admission: RE | Admit: 2022-06-14 | Discharge: 2022-06-14 | Disposition: A | Discharge status: Home / Self Care | Payer: Medicare HMO | Source: Ambulatory Visit | Attending: Radiation Oncology | Admitting: Radiation Oncology

## 2022-06-14 ENCOUNTER — Inpatient Hospital Stay (HOSPITAL_BASED_OUTPATIENT_CLINIC_OR_DEPARTMENT_OTHER): Payer: Medicare HMO | Admitting: Hematology and Oncology

## 2022-06-14 VITALS — BP 116/74 | HR 81 | Temp 97.8°F | Resp 16 | Ht 75.0 in | Wt 214.4 lb

## 2022-06-14 DIAGNOSIS — C321 Malignant neoplasm of supraglottis: Secondary | ICD-10-CM

## 2022-06-14 DIAGNOSIS — Z510 Encounter for antineoplastic radiation therapy: Secondary | ICD-10-CM | POA: Diagnosis not present

## 2022-06-14 LAB — CBC WITH DIFFERENTIAL (CANCER CENTER ONLY)
Abs Immature Granulocytes: 0.03 10*3/uL (ref 0.00–0.07)
Basophils Absolute: 0 10*3/uL (ref 0.0–0.1)
Basophils Relative: 0 %
Eosinophils Absolute: 0 10*3/uL (ref 0.0–0.5)
Eosinophils Relative: 1 %
HCT: 45.8 % (ref 39.0–52.0)
Hemoglobin: 15.6 g/dL (ref 13.0–17.0)
Immature Granulocytes: 0 %
Lymphocytes Relative: 8 %
Lymphs Abs: 0.6 10*3/uL — ABNORMAL LOW (ref 0.7–4.0)
MCH: 32.5 pg (ref 26.0–34.0)
MCHC: 34.1 g/dL (ref 30.0–36.0)
MCV: 95.4 fL (ref 80.0–100.0)
Monocytes Absolute: 0.5 10*3/uL (ref 0.1–1.0)
Monocytes Relative: 7 %
Neutro Abs: 6.3 10*3/uL (ref 1.7–7.7)
Neutrophils Relative %: 84 %
Platelet Count: 134 10*3/uL — ABNORMAL LOW (ref 150–400)
RBC: 4.8 MIL/uL (ref 4.22–5.81)
RDW: 13.4 % (ref 11.5–15.5)
WBC Count: 7.5 10*3/uL (ref 4.0–10.5)
nRBC: 0 % (ref 0.0–0.2)

## 2022-06-14 LAB — BASIC METABOLIC PANEL - CANCER CENTER ONLY
Anion gap: 3 — ABNORMAL LOW (ref 5–15)
BUN: 17 mg/dL (ref 8–23)
CO2: 32 mmol/L (ref 22–32)
Calcium: 8.8 mg/dL — ABNORMAL LOW (ref 8.9–10.3)
Chloride: 102 mmol/L (ref 98–111)
Creatinine: 0.92 mg/dL (ref 0.61–1.24)
GFR, Estimated: >60 mL/min (ref >60)
Glucose, Bld: 105 mg/dL — ABNORMAL HIGH (ref 70–99)
Potassium: 3.9 mmol/L (ref 3.5–5.1)
Sodium: 137 mmol/L (ref 135–145)

## 2022-06-14 LAB — RAD ONC ARIA SESSION SUMMARY
Course Elapsed Days: 15
Plan Fractions Treated to Date: 12
Plan Prescribed Dose Per Fraction: 2 Gy
Plan Total Fractions Prescribed: 35
Plan Total Prescribed Dose: 70 Gy
Reference Point Dosage Given to Date: 24 Gy
Reference Point Session Dosage Given: 2 Gy
Session Number: 12

## 2022-06-14 LAB — MAGNESIUM: Magnesium: 2 mg/dL (ref 1.7–2.4)

## 2022-06-14 MED FILL — Dexamethasone Sodium Phosphate Inj 100 MG/10ML: INTRAMUSCULAR | Qty: 1 | Status: AC

## 2022-06-14 MED FILL — Fosaprepitant Dimeglumine For IV Infusion 150 MG (Base Eq): INTRAVENOUS | Qty: 5 | Status: AC

## 2022-06-14 NOTE — Progress Notes (Signed)
Tea NOTE  Patient Care Team: [name], MD as PCP - General [name], MD as PCP - Cardiology (Cardiology) Viona Gilmore, [employer] as Pharmacist (Pharmacist) Malmfelt, Stephani Police, RN as Oncology Nurse Navigator [name], MD as Consulting Physician (Radiation Oncology) Benay Pike, MD as Consulting Physician (Hematology and Oncology)  CHIEF COMPLAINTS/PURPOSE OF CONSULTATION:  SCC supraglottis  ONCOLOGIC HISTORY: Initially presented to ENT with chronic left-sided sore throat for almost a year.  Initial evaluation did not show any possible causes for sore throat.  He was recommended PPI twice daily but went back to ENT with persistent sore throat he once again had a repeat fiberoptic exam which was stable.   CT neck done on November 08, 2021 did show subcentimeter focus of mucosal hyperenhancement along the posterior aspect of the uvula  laryngoscopy with direct visualization recommended to exclude a mucosal lesion at the site.  Additionally few tiny cystic-appearing foci along the left glossotonsillar sulcus.  Direct visualization recommended.  Nonspecific mildly enlarged right level 2 lymph node.  Multiple small calcific foci in the left palatine tonsil.Repeat fiberoptic exam was unremarkable hence Dr. 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  Tongue biopsy showed benign squamous mucosa with low to intermediate grade dysplasia. 05/07/2022: PET/CT showed asymmetric hypermetabolism in the left oropharynx and involving the left epiglottis.  Contralateral hypermetabolic level 2 cervical lymph nodes compatible with metastatic disease.  No hypermetabolic adenopathy in the left neck or in the chest.  1.2 cm short axis aortocaval lymph node in the upper abdomen is  hypermetabolic metastatic disease is a concern.  This lymph node has increased from 0.7 cm on the study from 2022.  No other metastatic disease. Started radiation on 05/30/2022 and weekly cisplatin on 06/01/2022.   INTERIM HISTORY: Cory [name] returns for a toxicity check before his Cycle 3 weekly cisplatin infusion scheduled for tomorrow.  So far he has been tolerating the treatment well. NO side effects from chemotherapy. Sore throat is tolerable. He just feels fatigued. Dysphagia is not too bothersome. No change in hearing. No neuropathy. Patient denies fevers, chills, night sweats, shortness of breath, chest pain or cough.  Rest of the pertinent 10 point ROS reviewed and negative  MEDICAL HISTORY:  Past Medical History:  Diagnosis Date   Aortic stenosis    a. Moderate by echo 05/2020.   Arthritis    right hip   Cancer ([place])    skin cancer nose, right left, upper left basel cell   Dysrhythmia    a-fib   PAF (paroxysmal atrial fibrillation) (HCC)    Peripheral arterial disease (HCC)    Tobacco abuse     SURGICAL HISTORY: Past Surgical History:  Procedure Laterality Date   ABDOMINAL AORTIC ENDOVASCULAR STENT GRAFT N/A 08/29/2020   Procedure: ABDOMINAL AORTIC ENDOVASCULAR STENT GRAFT;  Surgeon: Elam Dutch, MD;  Location: [place];  Service: Vascular;  Laterality: N/A;   ABDOMINAL AORTOGRAM W/LOWER EXTREMITY Bilateral 06/24/2020   Procedure: ABDOMINAL AORTOGRAM W/LOWER EXTREMITY;  Surgeon: Elam Dutch, MD;  Location: [place] [facility];  Service: Cardiovascular;  Laterality: Bilateral;   BACK SURGERY     2005   BIOPSY  05/21/2022   Procedure: BIOPSY;  Surgeon: Rush Landmark Telford Nab., MD;  Location: [facility];  Service: Gastroenterology;;   CERVICAL SPINE SURGERY     DIRECT LARYNGOSCOPY Bilateral  04/18/2022   Procedure: DIRECT LARYNGOSCOPY WITH BIOPSIES;  Surgeon: Melida Quitter, MD;  Location: [place];  Service: ENT;  Laterality: Bilateral;   EMBOLECTOMY Right  08/29/2020   Procedure: POPLITEAL AND TIBIAL EMBOLECTOMY;  Surgeon: Elam Dutch, MD;  Location: [place];  Service: Vascular;  Laterality: Right;   EMBOLIZATION Right 06/24/2020   Procedure: EMBOLIZATION;  Surgeon: Elam Dutch, MD;  Location: [place] [facility];  Service: Cardiovascular;  Laterality: Right;   ENDARTERECTOMY FEMORAL Right 08/29/2020   Procedure: ENDARTERECTOMY FEMORAL;  Surgeon: Elam Dutch, MD;  Location[facility] OR;  Service: Vascular;  Laterality: Right;   ESOPHAGOGASTRODUODENOSCOPY (EGD) WITH PROPOFOL N/A 05/21/2022   Procedure: ESOPHAGOGASTRODUODENOSCOPY (EGD) WITH PROPOFOL;  Surgeon: Irving Copas., MD;  Location: [facility];  Service: Gastroenterology;  Laterality: N/A;   EUS N/A 05/21/2022   Procedure: UPPER ENDOSCOPIC ULTRASOUND (EUS) RADIAL;  Surgeon: Irving Copas., MD;  Location: [facility];  Service: Gastroenterology;  Laterality: N/A;   EYE SURGERY     FEMORAL ARTERY EXPLORATION Right 06/25/2020   Procedure: RIGHT GROIN EXPLORATION Evacuation of Hematoma  WITH REPAIR OF RIGHT Common FEMORAL ARTERY.;  Surgeon: Angelia Mould, MD;  Location: [place];  Service: Vascular;  Laterality: Right;   FEMORAL-POPLITEAL BYPASS GRAFT Left 11/22/2020   Procedure: LEFT ABOVE KNEE-BELOW KNEE BYPASS TIBIAL PERITONEAL TRUNK REVERSE IPSILATERAL GREATER SAPHENOUS VEIN;  Surgeon: Elam Dutch, MD;  Location: [facility] OR;  Service: Vascular;  Laterality: Left;   FINE NEEDLE ASPIRATION N/A 05/21/2022   Procedure: FINE NEEDLE ASPIRATION (FNA) LINEAR;  Surgeon: Irving Copas., MD;  Location: [facility];  Service: Gastroenterology;  Laterality: N/A;   IR GASTROSTOMY TUBE MOD SED  06/12/2022   IR IMAGING GUIDED PORT INSERTION  06/12/2022   LEG SURGERY     Trauma   LOWER EXTREMITY ANGIOGRAPHY Left 11/17/2020   Procedure: Lower Extremity Angiography;  Surgeon: Marty Heck, MD;  Location: [place] [facility];  Service: Cardiovascular;   Laterality: Left;   PATCH ANGIOPLASTY Right 08/29/2020   Procedure: PATCH ANGIOPLASTY RIGHT BELOW KNEE POPLITEAL ARTERY AND RIGHT COMMON FEMORAL ARTERY;  Surgeon: Elam Dutch, MD;  Location: [place];  Service: Vascular;  Laterality: Right;   RETINAL DETACHMENT SURGERY     RIGID ESOPHAGOSCOPY  04/18/2022   Procedure: RIGID ESOPHAGOSCOPY;  Surgeon: Melida Quitter, MD;  Location: [place];  Service: ENT;;   TONSILLECTOMY  04/18/2022   Procedure: LEFT TONSILLECTOMY;  Surgeon: Melida Quitter, MD;  Location: [facility] OR;  Service: ENT;;   ULTRASOUND GUIDANCE FOR VASCULAR ACCESS Bilateral 08/29/2020   Procedure: ULTRASOUND GUIDANCE FOR VASCULAR ACCESS;  Surgeon: Elam Dutch, MD;  Location: [facility] OR;  Service: Vascular;  Laterality: Bilateral;    SOCIAL HISTORY: Social History   Socioeconomic History   Marital status: Married    Spouse name: Not on file   Number of children: 5   Years of education: Not on file   Highest education level: Master's degree (e.g., MA, MS, MEng, MEd, MSW, MBA)  Occupational History   Not on file  Tobacco Use   Smoking status: Every Day    Packs/day: 0.50    Years: 50.00    Total pack years: 25.00    Types: Cigarettes    Passive exposure: Never   Smokeless tobacco: Never  Vaping Use   Vaping Use: Never used  Substance and Sexual Activity   Alcohol use: Yes    Alcohol/week: 0.0 standard drinks of alcohol    Comment: 4 to 5  times a week; beer, wine and liquor    Drug use: No   Sexual activity: Not Currently  Other Topics Concern   Not on file  Social History Narrative   Scores reading tests.  Lives with wife.     Social Determinants of Health   Financial Resource Strain: Low Risk  (05/24/2022)   Overall Financial Resource Strain (CARDIA)    Difficulty of Paying Living Expenses: Not hard at all  Food Insecurity: No Food Insecurity (05/24/2022)   Hunger Vital Sign    Worried About Running Out of Food in the Last Year: Never true    Ran Out of Food in the  Last Year: Never true  Transportation Needs: No Transportation Needs (05/24/2022)   PRAPARE - Hydrologist (Medical): No    Lack of Transportation (Non-Medical): No  Physical Activity: Insufficiently Active (01/08/2022)   Exercise Vital Sign    Days of Exercise per Week: 3 days    Minutes of Exercise per Session: 30 min  Stress: No Stress Concern Present (01/08/2022)   [place]    Feeling of Stress : Not at all  Social Connections: Moderately Isolated (01/08/2022)   Social Connection and Isolation Panel [NHANES]    Frequency of Communication with Friends and Family: More than three times a week    Frequency of Social Gatherings with Friends and Family: Three times a week    Attends Religious Services: Never    Active Member of Clubs or Organizations: No    Attends Archivist Meetings: Not on file    Marital Status: Married  Human resources officer Violence: Not At Risk (06/07/2021)   Humiliation, Afraid, Rape, and Kick questionnaire    Fear of Current or Ex-Partner: No    Emotionally Abused: No    Physically Abused: No    Sexually Abused: No    FAMILY HISTORY: Family History  Problem Relation Age of Onset   Ovarian cancer Mother    Alcoholism Father    Breast cancer Sister    Ovarian cancer Sister     ALLERGIES:  is allergic to zithromax [azithromycin].  MEDICATIONS:  Current Outpatient Medications  Medication Sig Dispense Refill   acetaminophen (TYLENOL) 500 MG tablet Take 500 mg by mouth every 6 (six) hours as needed (for pain.).     cetirizine (ZYRTEC) 10 MG tablet Take 1 tablet (10 mg total) by mouth daily. (Patient taking differently: Take 10 mg by mouth daily as needed for allergies.) 30 tablet 11   cholecalciferol (VITAMIN D) 25 MCG (1000 UNIT) tablet Take 1,000 Units by mouth 2 (two) times daily.     dexamethasone (DECADRON) 4 MG tablet Take 2 tablets daily x 3 days  starting the day after cisplatin chemotherapy. Take with food. 30 tablet 1   diltiazem (TIADYLT ER) 120 MG 24 hr capsule TAKE 1 CAPSULE EVERY DAY (SCHEDULE OFFICE VISIT FOR FUTURE REFILLS) 90 capsule 1   lidocaine (XYLOCAINE) 2 % solution Patient: Mix 1part 2% viscous lidocaine, 1part H20. Swish & swallow 38m of diluted mixture, 35m before meals  and at bedtime, up to QID prn sore throat. 200 mL 3   lidocaine (XYLOCAINE) 2 % solution Mix 1 part lidocaine with 1 part water, then swish and swallow 10 ml of mixture 30 minutes before meals and at bedtime up to 4 times daily as needed for sore throat 200 mL 0   lidocaine-prilocaine (EMLA) cream Apply to affected area  once 30 g 3   metoprolol tartrate (LOPRESSOR) 25 MG tablet Take 1 tablet (25 mg total) by mouth 2 (two) times daily as needed. (Patient not taking: Reported on 06/07/2022) 30 tablet 1   nicotine (NICODERM CQ - DOSED IN MG/24 HOURS) 21 mg/24hr patch Place 1 patch (21 mg total) onto the skin daily. Apply 21 mg patch daily x 6 wk, then '14mg'$  patch daily x 2 wk, then 7 mg patch daily x 2 wk 14 patch 2   omeprazole (PRILOSEC) 40 MG capsule Take 1 capsule (40 mg total) by mouth daily. 30 capsule 6   ondansetron (ZOFRAN) 8 MG tablet Take 1 tablet (8 mg total) by mouth every 8 (eight) hours as needed for nausea or vomiting. Start on the third day after cisplatin. 30 tablet 1   prochlorperazine (COMPAZINE) 10 MG tablet Take 1 tablet (10 mg total) by mouth every 6 (six) hours as needed (Nausea or vomiting). 30 tablet 1   rivaroxaban (XARELTO) 20 MG TABS tablet Take 1 tablet (20 mg total) by mouth daily with supper. 90 tablet 1   rosuvastatin (CRESTOR) 10 MG tablet Take 1 tablet (10 mg total) by mouth daily. 90 tablet 1   sodium chloride (OCEAN) 0.65 % SOLN nasal spray Place 1 spray into both nostrils as needed for congestion.     No current facility-administered medications for this visit.     PHYSICAL EXAMINATION: ECOG PERFORMANCE STATUS: 1 -  Symptomatic but completely ambulatory  Vitals:   06/14/22 0845  BP: 116/74  Pulse: 81  Resp: 16  Temp: 97.8 F (36.6 C)  SpO2: 98%   Filed Weights   06/14/22 0845  Weight: 214 lb 6.4 oz (97.3 kg)    Physical Exam Constitutional:      Appearance: Normal appearance.  Cardiovascular:     Rate and Rhythm: Normal rate and regular rhythm.  Pulmonary:     Effort: Pulmonary effort is normal.     Breath sounds: Normal breath sounds.  Abdominal:     General: Abdomen is flat.     Palpations: Abdomen is soft.  Musculoskeletal:        General: No swelling or tenderness. Normal range of motion.     Cervical back: Normal range of motion and neck supple. No rigidity.  Lymphadenopathy:     Cervical: No cervical adenopathy.  Skin:    General: Skin is warm and dry.  Neurological:     General: No focal deficit present.     Mental Status: He is alert.      LABORATORY DATA:  I have reviewed the data as listed Lab Results  Component Value Date   WBC 7.5 06/14/2022   HGB 15.6 06/14/2022   HCT 45.8 06/14/2022   MCV 95.4 06/14/2022   PLT 134 (L) 06/14/2022     Chemistry      Component Value Date/Time   NA 138 06/12/2022 0954   NA 141 05/16/2018 1425   K 3.8 06/12/2022 0954   CL 104 06/12/2022 0954   CO2 28 06/12/2022 0954   BUN 19 06/12/2022 0954   BUN 13 05/16/2018 1425   CREATININE 0.75 06/12/2022 0954   CREATININE 0.95 06/07/2022 0812      Component Value Date/Time   CALCIUM 8.6 (L) 06/12/2022 0954   ALKPHOS 39 05/08/2022 1126   AST 14 (L) 05/08/2022 1126   ALT 17 05/08/2022 1126   BILITOT 0.4 05/08/2022 1126       RADIOGRAPHIC STUDIES: I have personally reviewed the  radiological images as listed and agreed with the findings in the report. IR Gastrostomy Tube  Result Date: 06/12/2022 INDICATION: 76 year old male with a history of head and neck cancer. He requires percutaneous gastrostomy tube placement prior to undergoing chemo radiation. EXAM: Fluoroscopically  guided placement of percutaneous pull-through gastrostomy tube Interventional Radiologist:  Criselda Peaches, MD MEDICATIONS: 2 g Ancef; Antibiotics were administered within 1 hour of the procedure. ANESTHESIA/SEDATION: Versed 2 mg IV; Fentanyl 100 mcg IV administered by radiology nursing. Moderate Sedation Time:  49 minutes The patient's vital signs and level of consciousness were continuously monitored during the procedure by the interventional radiology nurse under my direct supervision. CONTRAST:  37m OMNIPAQUE IOHEXOL 300 MG/ML  SOLN FLUOROSCOPY: Fluoroscopy Time:  minutes  seconds ( mGy). COMPLICATIONS: None immediate. PROCEDURE: Informed written consent was obtained from the patient after a thorough discussion of the procedural risks, benefits and alternatives. All questions were addressed. Maximal Sterile Barrier Technique was utilized including caps, mask, sterile gowns, sterile gloves, sterile drape, hand hygiene and skin antiseptic. A timeout was performed prior to the initiation of the procedure. Maximal barrier sterile technique utilized including caps, mask, sterile gowns, sterile gloves, large sterile drape, hand hygiene, and chlorhexadine skin prep. An angled catheter was advanced over a wire under fluoroscopic guidance through the nose, down the esophagus and into the body of the stomach. The stomach was then insufflated with several 100 ml of air. Fluoroscopy confirmed location of the gastric bubble, as well as inferior displacement of the barium stained colon. Under direct fluoroscopic guidance, a single T-tack was placed, and the anterior gastric wall drawn up against the anterior abdominal wall. Percutaneous access was then obtained into the mid gastric body with an 18 gauge sheath needle. Aspiration of air, and injection of contrast material under fluoroscopy confirmed needle placement. An Amplatz wire was advanced in the gastric body and the access needle exchanged for a 9-French vascular  sheath. A snare device was advanced through the vascular sheath and an Amplatz wire advanced through the angled catheter. The Amplatz wire was successfully snared and this was pulled up through the esophagus and out the mouth. A 20-French KAlinda DoomsMIC-PEG tube was then connected to the snare and pulled through the mouth, down the esophagus, into the stomach and out to the anterior abdominal wall. Hand injection of contrast material confirmed intragastric location. The T-tack retention suture was then cut. The pull through peg tube was then secured with the external bumper and capped. The patient will be observed for several hours with the newly placed tube on low wall suction to evaluate for any post procedure complication. The patient tolerated the procedure well, there is no immediate complication. IMPRESSION: Successful placement of a 20 French pull through gastrostomy tube. Electronically Signed   By: HJacqulynn CadetM.D.   On: 06/12/2022 13:43   IR IMAGING GUIDED PORT INSERTION  Result Date: 06/12/2022 INDICATION: 76year old male with head and neck cancer. EXAM: IMPLANTED PORT A CATH PLACEMENT WITH ULTRASOUND AND FLUOROSCOPIC GUIDANCE MEDICATIONS: None. ANESTHESIA/SEDATION: None. FLUOROSCOPY: Radiation exposure index: 2 mGy reference air kerma COMPLICATIONS: None immediate. PROCEDURE: The right neck and chest was prepped with chlorhexidine, and draped in the usual sterile fashion using maximum barrier technique (cap and mask, sterile gown, sterile gloves, large sterile sheet, hand hygiene and cutaneous antiseptic). Local anesthesia was attained by infiltration with 1% lidocaine with epinephrine. Ultrasound demonstrated patency of the right internal jugular vein, and this was documented with an image. Under real-time ultrasound guidance,  this vein was accessed with a 21 gauge micropuncture needle and image documentation was performed. A small dermatotomy was made at the access site with an 11  scalpel. A 0.018" wire was advanced into the SVC and the access needle exchanged for a 29F micropuncture vascular sheath. The 0.018" wire was then removed and a 0.035" wire advanced into the IVC. An appropriate location for the subcutaneous reservoir was selected below the clavicle and an incision was made through the skin and underlying soft tissues. The subcutaneous tissues were then dissected using a combination of blunt and sharp surgical technique and a pocket was formed. A single lumen power injectable portacatheter was then tunneled through the subcutaneous tissues from the pocket to the dermatotomy and the port reservoir placed within the subcutaneous pocket. The venous access site was then serially dilated and a peel away vascular sheath placed over the wire. The wire was removed and the port catheter advanced into position under fluoroscopic guidance. The catheter tip is positioned in the superior cavoatrial junction. This was documented with a spot image. The portacatheter was then tested and found to flush and aspirate well. The port was flushed with saline followed by 100 units/mL heparinized saline. The pocket was then closed in two layers using first subdermal inverted interrupted absorbable sutures followed by a running subcuticular suture. The epidermis was then sealed with Dermabond. The dermatotomy at the venous access site was also closed with Dermabond. IMPRESSION: Successful placement of a right IJ approach Power Port with ultrasound and fluoroscopic guidance. The catheter is ready for use. Electronically Signed   By: Jacqulynn Cadet M.D.   On: 06/12/2022 13:40   ECHOCARDIOGRAM COMPLETE  Result Date: 05/15/2022    ECHOCARDIOGRAM REPORT   Patient Name:   KYZER BLOWE Date of Exam: 05/15/2022 Medical Rec #:  [id number]     Height:       76.0 in Accession #:    [id number]    Weight:       214.3 lb Date of Birth:  22-Feb-1946     BSA:          2.281 m Patient Age:    55 years      BP:            131/87 mmHg Patient Gender: M             HR:           63 bpm. Exam Location:  [facility] Procedure: 2D Echo, Cardiac Doppler and Color Doppler Indications:    I35.0 AS  History:        Patient has prior history of Echocardiogram examinations, most                 recent 05/31/2020. Dilated aortic root, AS, Arrythmias:Atrial                 Fibrillation; Risk Factors:Current Smoker.  Sonographer:    Coralyn Helling RDCS Referring Phys: [place]  1. Left ventricular ejection fraction, by estimation, is 60 to 65%. The left ventricle has normal function. The left ventricle has no regional wall motion abnormalities. There is mild left ventricular hypertrophy. Left ventricular diastolic parameters are consistent with Grade I diastolic dysfunction (impaired relaxation). Elevated left atrial pressure.  2. Right ventricular systolic function is normal. The right ventricular size is normal.  3. The mitral valve is normal in structure. No evidence of mitral valve regurgitation. No evidence of mitral stenosis. Moderate mitral annular calcification.  4. The aortic valve is calcified. Aortic valve regurgitation is mild. Moderate aortic valve stenosis.  5. Aortic dilatation noted. There is mild dilatation of the aortic root, measuring 43 mm. There is moderate dilatation of the ascending aorta, measuring 45 mm. FINDINGS  Left Ventricle: Left ventricular ejection fraction, by estimation, is 60 to 65%. The left ventricle has normal function. The left ventricle has no regional wall motion abnormalities. The left ventricular internal cavity size was normal in size. There is  mild left ventricular hypertrophy. Left ventricular diastolic parameters are consistent with Grade I diastolic dysfunction (impaired relaxation). Elevated left atrial pressure. Right Ventricle: The right ventricular size is normal. Right ventricular systolic function is normal. Left Atrium: Left atrial size was normal in size. Right  Atrium: Right atrial size was normal in size. Pericardium: There is no evidence of pericardial effusion. Mitral Valve: The mitral valve is normal in structure. Moderate mitral annular calcification. No evidence of mitral valve regurgitation. No evidence of mitral valve stenosis. Tricuspid Valve: The tricuspid valve is normal in structure. Tricuspid valve regurgitation is trivial. No evidence of tricuspid stenosis. Aortic Valve: The aortic valve is calcified. Aortic valve regurgitation is mild. Aortic regurgitation PHT measures 489 msec. Moderate aortic stenosis is present. Aortic valve mean gradient measures 22.5 mmHg. Aortic valve peak gradient measures 44.6 mmHg. Aortic valve area, by VTI measures 2.93 cm. Pulmonic Valve: The pulmonic valve was not well visualized. Pulmonic valve regurgitation is not visualized. No evidence of pulmonic stenosis. Aorta: Aortic dilatation noted. There is mild dilatation of the aortic root, measuring 43 mm. There is moderate dilatation of the ascending aorta, measuring 45 mm. Venous: The inferior vena cava was not well visualized. IAS/Shunts: The interatrial septum was not well visualized.  LEFT VENTRICLE PLAX 2D LVIDd:         3.60 cm   Diastology LVIDs:         2.50 cm   LV e' medial:    6.42 cm/s LV PW:         1.40 cm   LV E/e' medial:  20.2 LV IVS:        1.50 cm   LV e' lateral:   10.10 cm/s LVOT diam:     2.80 cm   LV E/e' lateral: 12.9 LV SV:         203 LV SV Index:   89 LVOT Area:     6.16 cm  RIGHT VENTRICLE RV S prime:     20.70 cm/s TAPSE (M-mode): 2.2 cm RVSP:           36.6 mmHg LEFT ATRIUM             Index        RIGHT ATRIUM           Index LA diam:        3.40 cm 1.49 cm/m   RA Pressure: 3.00 mmHg LA Vol (A2C):   79.2 ml 34.72 ml/m  RA Area:     16.80 cm LA Vol (A4C):   44.0 ml 19.29 ml/m  RA Volume:   46.70 ml  20.47 ml/m LA Biplane Vol: 62.4 ml 27.35 ml/m  AORTIC VALVE AV Area (Vmax):    2.69 cm AV Area (Vmean):   2.84 cm AV Area (VTI):     2.93 cm AV  Vmax:           334.00 cm/s AV Vmean:          216.500 cm/s AV VTI:  0.694 m AV Peak Grad:      44.6 mmHg AV Mean Grad:      22.5 mmHg LVOT Vmax:         146.00 cm/s LVOT Vmean:        100.000 cm/s LVOT VTI:          0.330 m LVOT/AV VTI ratio: 0.48 AI PHT:            489 msec  AORTA Ao Root diam: 4.30 cm Ao Asc diam:  4.50 cm MITRAL VALVE                TRICUSPID VALVE MV Area (PHT): 2.19 cm     TR Peak grad:   33.6 mmHg MV Decel Time: 347 msec     TR Vmax:        290.00 cm/s MV E velocity: 130.00 cm/s  Estimated RAP:  3.00 mmHg MV A velocity: 152.00 cm/s  RVSP:           36.6 mmHg MV E/A ratio:  0.86                             SHUNTS                             Systemic VTI:  0.33 m                             Systemic Diam: 2.80 cm Kirk Ruths MD Electronically signed by Kirk Ruths MD Signature Date/Time: 05/15/2022/1:32:57 PM    Final     ASSESSMENT AND PLAN: Cory [name] is a 76 y.o. male who presents to the clinic for a follow up for SCC of supraglottis.  #SCC of supraglottis: --Started concurrent chemoradiation. Radiation was started on 05/30/2022. Weekly cisplatin started on 06/01/2022.  --Patient presents today for a toxicity evaluation before planned Cycle 3 of cisplatin scheduled for tomorrow.  --Labs from today were reviewed and adequate for treatment tomorrow.  --Patient has no prohibitive toxicities for treatment so can proceed without any dose modifications.  --RTC in one week with labs, f/u visit with Dr. Chryl Heck before cycle 4.  #Sore Throat/Dysphagia: --He has viscous lidocaine but hasn't been using it. 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  --Repeat biopsy scheduled on 11/22. No B symptoms. -- He will come see on 11/21, will repeat CBC to make sure he is good to go for the procedure.  All questions were answered. The patient knows to call the clinic with any problems, questions or  concerns.  I have spent a total of 30 minutes minutes of face-to-face and non-face-to-face time, preparing to see the patient, performing a medically appropriate examination, counseling and educating the patient, documenting clinical information in the electronic health record, and care coordination.    Benay Pike MD

## 2022-06-15 ENCOUNTER — Other Ambulatory Visit: Payer: Self-pay

## 2022-06-15 ENCOUNTER — Ambulatory Visit
Admission: RE | Admit: 2022-06-15 | Discharge: 2022-06-15 | Disposition: A | Discharge status: Home / Self Care | Payer: Medicare HMO | Source: Ambulatory Visit | Attending: Radiation Oncology | Admitting: Radiation Oncology

## 2022-06-15 ENCOUNTER — Inpatient Hospital Stay: Payer: Medicare HMO

## 2022-06-15 ENCOUNTER — Inpatient Hospital Stay: Payer: Medicare HMO | Admitting: Dietician

## 2022-06-15 VITALS — BP 142/70 | HR 86 | Temp 97.7°F | Resp 17

## 2022-06-15 DIAGNOSIS — Z510 Encounter for antineoplastic radiation therapy: Secondary | ICD-10-CM | POA: Diagnosis not present

## 2022-06-15 DIAGNOSIS — C321 Malignant neoplasm of supraglottis: Secondary | ICD-10-CM

## 2022-06-15 LAB — RAD ONC ARIA SESSION SUMMARY
Course Elapsed Days: 16
Plan Fractions Treated to Date: 13
Plan Prescribed Dose Per Fraction: 2 Gy
Plan Total Fractions Prescribed: 35
Plan Total Prescribed Dose: 70 Gy
Reference Point Dosage Given to Date: 26 Gy
Reference Point Session Dosage Given: 2 Gy
Session Number: 13

## 2022-06-15 MED ORDER — MAGNESIUM SULFATE 2 GM/50ML IV SOLN
2.0000 g | Freq: Once | INTRAVENOUS | Status: AC
  Administered 2022-06-15: 2 g via INTRAVENOUS
  Filled 2022-06-15: qty 50

## 2022-06-15 MED ORDER — SODIUM CHLORIDE 0.9 % IV SOLN
10.0000 mg | Freq: Once | INTRAVENOUS | Status: AC
  Administered 2022-06-15: 10 mg via INTRAVENOUS
  Filled 2022-06-15: qty 10

## 2022-06-15 MED ORDER — PALONOSETRON HCL INJECTION 0.25 MG/5ML
0.2500 mg | Freq: Once | INTRAVENOUS | Status: AC
  Administered 2022-06-15: 0.25 mg via INTRAVENOUS
  Filled 2022-06-15: qty 5

## 2022-06-15 MED ORDER — HEPARIN SOD (PORK) LOCK FLUSH 100 UNIT/ML IV SOLN
500.0000 [IU] | Freq: Once | INTRAVENOUS | Status: AC | PRN
  Administered 2022-06-15: 500 [IU]

## 2022-06-15 MED ORDER — SODIUM CHLORIDE 0.9 % IV SOLN: Freq: Once | INTRAVENOUS | Status: AC

## 2022-06-15 MED ORDER — SODIUM CHLORIDE 0.9% FLUSH
10.0000 mL | INTRAVENOUS | Status: DC | PRN
  Administered 2022-06-15: 10 mL

## 2022-06-15 MED ORDER — POTASSIUM CHLORIDE IN NACL 20-0.9 MEQ/L-% IV SOLN
Freq: Once | INTRAVENOUS | Status: AC
  Filled 2022-06-15: qty 1000

## 2022-06-15 MED ORDER — SODIUM CHLORIDE 0.9 % IV SOLN
150.0000 mg | Freq: Once | INTRAVENOUS | Status: AC
  Administered 2022-06-15: 150 mg via INTRAVENOUS
  Filled 2022-06-15: qty 150

## 2022-06-15 MED ORDER — SODIUM CHLORIDE 0.9 % IV SOLN
40.0000 mg/m2 | Freq: Once | INTRAVENOUS | Status: AC
  Administered 2022-06-15: 91 mg via INTRAVENOUS
  Filled 2022-06-15: qty 91

## 2022-06-15 NOTE — Progress Notes (Signed)
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 He is receiving concurrent chemoradiation with weekly cisplatin (first treatment 10/27).    PEG placed 11/7  Met with patient during infusion. He reports his mouth is getting dry. Taste of foods is diminished. He denies swallowing difficulty, sore throat, nausea, vomiting, diarrhea. He has started taking stool softener for mild constipation. Patient reports abdominal soreness continues and has not been sleeping well the last couple nights. Patient states he was instructed NPO x 36 hours after feeding tube placement by IR MD. He was unsure of why. Patient endorses good appetite, says weight loss secondary to following NPO orders. He had eggs with cheese, english muffin for breakfast, unable to recall lunch, had teriyaki chicken, rice, broccoli for supper, banana pudding and fruit popsicle for dessert. Patient reports he has been snacking on peanut butter/vanilla wafers this morning. Patient is drinking ~80 ounces of water. He has not been drinking Ensure. Patient is agreeable to chocolate Ensure Complete at visit today.   Medications: reviewed  Labs: reviewed   Anthropometrics: Weight 214 lb 6.4 oz on 11/9 decreased 3.2% (7 lbs) in one week - severe  11/3 - 221 lb 6 oz  10/27 - 220 lb 14.4 oz   Estimated Energy Needs  Kcals: 2500-3000 Protein: 130-150 Fluid: >3L  NUTRITION DIAGNOSIS: Predicted suboptimal intake continues   INTERVENTION:  Reinforced education on increased calorie/protein energy intake to support weight maintenance Patient agreeable to drinking 2 Ensure Plus/equivalent daily Encouraged high calorie high protein foods Continue daily water flushes  Patient will start using tube to supplement oral intake if weights continue to decline Continue stool softener    MONITORING, EVALUATION, GOAL: weight trends, intake   NEXT  VISIT: Friday November 17 during infusion    

## 2022-06-15 NOTE — Patient Instructions (Signed)
[facility]  Discharge Instructions: Thank you for choosing [facility] to provide your oncology and hematology care.   If you have a lab appointment with the Cancer Center, please go directly to the Cancer Center and check in at the registration area.   Wear comfortable clothing and clothing appropriate for easy access to any Portacath or PICC line.   We strive to give you quality time with your provider. You may need to reschedule your appointment if you arrive late (15 or more minutes).  Arriving late affects you and other patients whose appointments are after yours.  Also, if you miss three or more appointments without notifying the office, you may be dismissed from the clinic at the provider's discretion.      For prescription refill requests, have your pharmacy contact our office and allow 72 hours for refills to be completed.    Today you received the following chemotherapy and/or immunotherapy agents: Cisplatin.       To help prevent nausea and vomiting after your treatment, we encourage you to take your nausea medication as directed.  BELOW ARE SYMPTOMS THAT SHOULD BE REPORTED IMMEDIATELY: *FEVER GREATER THAN 100.4 F (38 C) OR HIGHER *CHILLS OR SWEATING *NAUSEA AND VOMITING THAT IS NOT CONTROLLED WITH YOUR NAUSEA MEDICATION *UNUSUAL SHORTNESS OF BREATH *UNUSUAL BRUISING OR BLEEDING *URINARY PROBLEMS (pain or burning when urinating, or frequent urination) *BOWEL PROBLEMS (unusual diarrhea, constipation, pain near the anus) TENDERNESS IN MOUTH AND THROAT WITH OR WITHOUT PRESENCE OF ULCERS (sore throat, sores in mouth, or a toothache) UNUSUAL RASH, SWELLING OR PAIN  UNUSUAL VAGINAL DISCHARGE OR ITCHING   Items with * indicate a potential emergency and should be followed up as soon as possible or go to the Emergency Department if any problems should occur.  Please show the CHEMOTHERAPY ALERT CARD or IMMUNOTHERAPY ALERT CARD at check-in  to the Emergency Department and triage nurse.  Should you have questions after your visit or need to cancel or reschedule your appointment, please contact [facility]  Dept: [phone]  and follow the prompts.  Office hours are 8:00 a.m. to 4:30 p.m. Monday - Friday. Please note that voicemails left after 4:00 p.m. may not be returned until the following business day.  We are closed weekends and major holidays. You have access to a nurse at all times for urgent questions. Please call the main number to the clinic Dept: [phone] and follow the prompts.   For any non-urgent questions, you may also contact your provider using MyChart. We now offer e-Visits for anyone 18 and older to request care online for non-urgent symptoms. For details visit [email or web address].   Also download the MyChart app! Go to the app store, search "MyChart", open the app, select [place], and log in with your MyChart username and password.  Masks are optional in the cancer centers. If you would like for your care team to wear a mask while they are taking care of you, please let them know. You may have one support person who is at least 78 years old accompany you for your appointments. 

## 2022-06-18 ENCOUNTER — Ambulatory Visit
Admission: RE | Admit: 2022-06-18 | Discharge: 2022-06-18 | Disposition: A | Discharge status: Home / Self Care | Payer: Medicare HMO | Source: Ambulatory Visit | Attending: Radiation Oncology | Admitting: Radiation Oncology

## 2022-06-18 ENCOUNTER — Telehealth: Payer: Self-pay | Admitting: Pharmacist

## 2022-06-18 ENCOUNTER — Other Ambulatory Visit: Payer: Self-pay

## 2022-06-18 ENCOUNTER — Ambulatory Visit: Payer: Medicare HMO

## 2022-06-18 DIAGNOSIS — C321 Malignant neoplasm of supraglottis: Secondary | ICD-10-CM | POA: Diagnosis not present

## 2022-06-18 DIAGNOSIS — Z510 Encounter for antineoplastic radiation therapy: Secondary | ICD-10-CM | POA: Diagnosis not present

## 2022-06-18 LAB — RAD ONC ARIA SESSION SUMMARY
Course Elapsed Days: 19
Plan Fractions Treated to Date: 14
Plan Prescribed Dose Per Fraction: 2 Gy
Plan Total Fractions Prescribed: 35
Plan Total Prescribed Dose: 70 Gy
Reference Point Dosage Given to Date: 28 Gy
Reference Point Session Dosage Given: 2 Gy
Session Number: 14

## 2022-06-18 NOTE — Chronic Care Management (AMB) (Signed)
    Chronic Care Management Pharmacy Assistant   Name: Cory [name]  MRN: [id number] DOB: 1946-01-28  06/18/22  APPOINTMENT REMINDER  Patient has checked in and is aware to have all medications, supplements and any blood glucose and blood pressure readings available for review with Jeni Salles, Pharm. D, for telephone visit on 11/14 at 11:15.    Care Gaps: Hepatitis C Screening - Overdue Zoster Vaccine - Overdue TDAP - Overdue Flu Vaccine - Overdue Lunc Cx Screen - Overdue AWV- 06/07/21   Star Rating Drugs: Rosuvastatin 10 mg - Last filled 04/29/22 90 DS at [facility]     Patient Assistance: [name] - Xarelto 2023 in process      SIG:       Medications: Outpatient Encounter Medications as of 06/18/2022  Medication Sig Note   acetaminophen (TYLENOL) 500 MG tablet Take 500 mg by mouth every 6 (six) hours as needed (for pain.).    cetirizine (ZYRTEC) 10 MG tablet Take 1 tablet (10 mg total) by mouth daily. (Patient taking differently: Take 10 mg by mouth daily as needed for allergies.)    cholecalciferol (VITAMIN D) 25 MCG (1000 UNIT) tablet Take 1,000 Units by mouth 2 (two) times daily.    dexamethasone (DECADRON) 4 MG tablet Take 2 tablets daily x 3 days starting the day after cisplatin chemotherapy. Take with food.    diltiazem (TIADYLT ER) 120 MG 24 hr capsule TAKE 1 CAPSULE EVERY DAY (SCHEDULE OFFICE VISIT FOR FUTURE REFILLS)    lidocaine (XYLOCAINE) 2 % solution Patient: Mix 1part 2% viscous lidocaine, 1part H20. Swish & swallow 84m of diluted mixture, 393m before meals  and at bedtime, up to QID prn sore throat. 06/12/2022: Hasn't needed yet   lidocaine (XYLOCAINE) 2 % solution Mix 1 part lidocaine with 1 part water, then swish and swallow 10 ml of mixture 30 minutes before meals and at bedtime up to 4 times daily as needed for sore throat    lidocaine-prilocaine (EMLA) cream Apply to affected area once 06/04/2022: New Med - Have not started    metoprolol tartrate  (LOPRESSOR) 25 MG tablet Take 1 tablet (25 mg total) by mouth 2 (two) times daily as needed. (Patient not taking: Reported on 06/07/2022)    nicotine (NICODERM CQ - DOSED IN MG/24 HOURS) 21 mg/24hr patch Place 1 patch (21 mg total) onto the skin daily. Apply 21 mg patch daily x 6 wk, then '14mg'$  patch daily x 2 wk, then 7 mg patch daily x 2 wk    omeprazole (PRILOSEC) 40 MG capsule Take 1 capsule (40 mg total) by mouth daily.    ondansetron (ZOFRAN) 8 MG tablet Take 1 tablet (8 mg total) by mouth every 8 (eight) hours as needed for nausea or vomiting. Start on the third day after cisplatin.    prochlorperazine (COMPAZINE) 10 MG tablet Take 1 tablet (10 mg total) by mouth every 6 (six) hours as needed (Nausea or vomiting).    rivaroxaban (XARELTO) 20 MG TABS tablet Take 1 tablet (20 mg total) by mouth daily with supper.    rosuvastatin (CRESTOR) 10 MG tablet Take 1 tablet (10 mg total) by mouth daily.    sodium chloride (OCEAN) 0.65 % SOLN nasal spray Place 1 spray into both nostrils as needed for congestion.    No facility-administered encounter medications on file as of 06/18/2022.     [facility] Pharmacist Assistant [phone]

## 2022-06-19 ENCOUNTER — Ambulatory Visit: Payer: Medicare HMO | Admitting: Pharmacist

## 2022-06-19 ENCOUNTER — Ambulatory Visit
Admission: RE | Admit: 2022-06-19 | Discharge: 2022-06-19 | Disposition: A | Discharge status: Home / Self Care | Payer: Medicare HMO | Source: Ambulatory Visit | Attending: Radiation Oncology | Admitting: Radiation Oncology

## 2022-06-19 ENCOUNTER — Other Ambulatory Visit: Payer: Self-pay

## 2022-06-19 ENCOUNTER — Encounter (HOSPITAL_COMMUNITY): Payer: Self-pay | Admitting: Gastroenterology

## 2022-06-19 DIAGNOSIS — Z720 Tobacco use: Secondary | ICD-10-CM

## 2022-06-19 DIAGNOSIS — C321 Malignant neoplasm of supraglottis: Secondary | ICD-10-CM | POA: Diagnosis not present

## 2022-06-19 DIAGNOSIS — I10 Essential (primary) hypertension: Secondary | ICD-10-CM

## 2022-06-19 DIAGNOSIS — Z510 Encounter for antineoplastic radiation therapy: Secondary | ICD-10-CM | POA: Diagnosis not present

## 2022-06-19 LAB — RAD ONC ARIA SESSION SUMMARY
Course Elapsed Days: 20
Plan Fractions Treated to Date: 15
Plan Prescribed Dose Per Fraction: 2 Gy
Plan Total Fractions Prescribed: 35
Plan Total Prescribed Dose: 70 Gy
Reference Point Dosage Given to Date: 30 Gy
Reference Point Session Dosage Given: 2 Gy
Session Number: 15

## 2022-06-19 NOTE — Patient Instructions (Signed)
Hi Cory [name],   Keep up the good work with staying motivated for quitting smoking!  Don't forget to call the quit line to get free patches and gum or lozenges. I really think you should do both because that tends to be the most successful for fully quitting. The gum or lozenges will really help with the cravings.  Please reach out to me if you have any questions or need anything!  Best, Maddie  Jeni Salles, PharmD, [facility]  Visit Information   Goals Addressed   None    Patient Care Plan: CCM Pharmacy Care Plan     Problem Identified: Problem: Hypertension, Hyperlipidemia, Atrial Fibrillation, GERD, Tobacco use, Allergic Rhinitis, and PAD      Long-Range Goal: Patient-Specific Goal   Start Date: 12/13/2021  Expected End Date: 12/14/2022  Recent Progress: On track  Priority: High  Note:   Current Barriers:  Unable to independently monitor therapeutic efficacy  Pharmacist Clinical Goal(s):  Patient will achieve adherence to monitoring guidelines and medication adherence to achieve therapeutic efficacy through collaboration with PharmD and provider.   Interventions: 1:1 collaboration with [name], MD regarding development and update of comprehensive plan of care as evidenced by provider attestation and co-signature Inter-disciplinary care team collaboration (see longitudinal plan of care) Comprehensive medication review performed; medication list updated in electronic medical record  Hypertension (BP goal <130/80) -Not ideally controlled -Current treatment: Metoprolol tartrate 25 mg 1 tablet twice daily as needed  - Appropriate, Effective, Safe, Accessible Diltiazem 120 mg 1 capsule daily - Appropriate, Query effective, Safe, Accessible -Medications previously tried: n/a  -Current home readings: 128/74 - usually in the 120s - 130s range, 70s (arm cuff- checks every other day) -Current dietary habits:  never been much of a salt person; home cooking; canned foods not often -Current exercise habits: trying to walk daily -Denies hypotensive/hypertensive symptoms -Educated on BP goals and benefits of medications for prevention of heart attack, stroke and kidney damage; Importance of home blood pressure monitoring; Proper BP monitoring technique; -Counseled to monitor BP at home a couple times a week, document, and provide log at future appointments -Counseled on diet and exercise extensively Recommended to continue current medication  Hyperlipidemia: (LDL goal < 70) -Controlled -Current treatment: Rosuvastatin 10 mg 1 tablet daily - Appropriate, Effective, Safe, Accessible -Medications previously tried: none  -Current dietary patterns: fried chicken every once in a while;  -Current exercise habits: trying to walk daily; doing stretches -Educated on Cholesterol goals;  Benefits of statin for ASCVD risk reduction; Importance of limiting foods high in cholesterol; -Counseled on diet and exercise extensively Recommended to continue current medication  Atrial Fibrillation (Goal: prevent stroke and major bleeding) -Controlled -CHADSVASC: 3 -Current treatment: Rate control: Metoprolol tartrate 25 mg 1 tablet twice daily as needed; Diltiazem 120 mg 1 capsule daily - Appropriate, Effective, Safe, Accessible Anticoagulation: Xarelto 20 mg 1 tablet daily with supper - Appropriate, Effective, Safe, Accessible -Medications previously tried: n/a -Home BP and HR readings: 68-80; checking BP and HR a few times a week -Counseled on increased risk of stroke due to Afib and benefits of anticoagulation for stroke prevention; bleeding risk associated with Xarelto and importance of self-monitoring for signs/symptoms of bleeding; avoidance of NSAIDs due to increased bleeding risk with anticoagulants; -Counseled on diet and exercise extensively Recommended to continue current medication Assessed patient  finances. Will follow up on submitted PAP.  Allergic rhinitis (Goal: minimize allergies symptoms) -Controlled -Current treatment  Cetirizine 10  mg 1 tablet daily - Appropriate, Effective, Safe, Accessible Saline nasal spray as needed - Appropriate, Effective, Safe, Accessible -Medications previously tried: Flonase nasal spray (ineffective)  -Counseled on diet and exercise extensively Recommended to continue current medication  Pain (Goal: minimize pain) -Controlled -Current treatment  Acetaminophen 500 mg 1 tablet as needed - Appropriate, Effective, Safe, Accessible -Medications previously tried: Tramadol (side effects)  -Counseled on maximum of 3000 mg of Tylenol per day.  Tobacco use (Goal quit smoking) -Controlled -Previous quit attempts: none -Current treatment  Nicotine patches 21 mg 1 patch daily - Appropriate, Query effective, Safe, Accessible -Patient smokes Within 30 minutes of waking -Patient triggers include: drinking coffee -On a scale of 1-10, reports MOTIVATION to quit is 9 -On a scale of 1-10, reports CONFIDENCE in quitting is 9 -Provided contact information for [place] Quit Line (1-800-QUIT-NOW) and encouraged patient to reach out to this group for support. -Recommended the addition of lozenges or gum for cravings.   Health Maintenance -Vaccine gaps: tetanus, shingrix -Current therapy:  Vitamin D 1000 units 1 tablet daily -Educated on Cost vs benefit of each product must be carefully weighed by individual consumer -Patient is satisfied with current therapy and denies issues -Recommended to continue current medication  Patient Goals/Self-Care Activities Patient will:  - take medications as prescribed as evidenced by patient report and record review check blood pressure a few times a week, document, and provide at future appointments target a minimum of 150 minutes of moderate intensity exercise weekly  Follow Up Plan: The care management team will reach out to  the patient again over the next 7 days.         Patient verbalizes understanding of instructions and care plan provided today and agrees to view in [place]. Active MyChart status and patient understanding of how to access instructions and care plan via MyChart confirmed with patient.    The pharmacy team will reach out to the patient again over the next 7 days.   Viona Gilmore, [facility]

## 2022-06-19 NOTE — Progress Notes (Addendum)
Chronic Care Management Pharmacy Note  06/19/2022 Name:  Cory [name] MRN:  [id number] DOB:  February 26, 1946  Summary: BP is mostly at goal < 130/80 Pt is trying to quit smoking  Recommendations/Changes made from today's visit: -Recommended bringing BP cuff to office to ensure accuracy -Recommended the addition of nicotine lozenges or gum 4 mg every 1-2 hours as needed for cravings -Provided [place] quit line number for resources  Plan: Follow up on Xarelto PAP BP and smoking assessment in 1-2 months Follow up in 6 months   Subjective: Cory [name] is an 76 y.o. year old male who is a primary patient of Burchette, Alinda Sierras, MD.  The CCM team was consulted for assistance with disease management and care coordination needs.    Engaged with patient by telephone for follow up visit in response to provider referral for pharmacy case management and/or care coordination services.   Consent to Services:  The patient was given information about Chronic Care Management services, agreed to services, and gave verbal consent prior to initiation of services.  Please see initial visit note for detailed documentation.   Patient Care Team: [name], MD as PCP - General [name], MD as PCP - Cardiology (Cardiology) Viona Gilmore, [facility] as Pharmacist (Pharmacist) Malmfelt, Stephani Police, RN as Oncology Nurse Navigator [name], MD as Consulting Physician (Radiation Oncology) Benay Pike, MD as Consulting Physician (Hematology and Oncology)  Recent office visits: 01/09/22 [name], MD - Patient presented for Atrial fibrillation and other concerns. No medication changes.   Recent consult visits: 06/19/22 [name], MD (Cancer center): Patient presented for cisplatin infusion.  06/15/22 [name], MD (Cancer center): Patient presented for cisplatin infusion.  06/14/22 Benay Pike, MD (hem/onc): Patient presented for malignant neoplasm of supraglottis. Continue  with cisplatin and follow up visit prior to cycle 4.  06/08/22 Jethro Bolus, RN (hem/onc): Patient presented for cisplatin infusion.  06/07/22 Dede Query, PA-C (hem/onc): Patient presented for malignant neoplasm of supraglottis. Continue with cisplatin and follow up visit prior to cycle 3.   05/31/22 Benay Pike, MD (hem/onc): Patient presented for malignant neoplasm of supraglottis. Follow up in 1 week.  05/23/22 Patient presented to [facility] for [place].   05/21/22 Patient presented to [facility] for ESOPHAGOGASTRODUODENOSCOPY (EGD) WITH PROPOFOL. Changed Xarelto.   05/17/22 Miguel Aschoff (Cardiology) - Patient presented for Pre-op Cardiovascular exam. Instructed to hold Xarelto 2 days prior to procedure.   05/08/22 Benay Pike, MD (Hematology & Oncology) - Patient presented for Malignant neoplasm of supraglottis. No medication changes.   05/08/22 [name] MD (Rad Oncology) - Patient presented for Cancer of supraglottis and other concerns. Prescribed Nicotine Patches.   05/07/22 Patient presented to [facility] for [place] Pet Scan.   05/04/22 Hessie Knows, MD (ENT) - Patient presented for Post op exam and other concerns. No medication changes.   04/12/22 Warren Lacy PO-C ( Internal Med) - Patient presented for Preoperative clearance and other concerns. No medication changes.  Hospital visits: Medication Reconciliation was completed by comparing discharge summary, patient's EMR and Pharmacy list, and upon discussion with patient.   Patient presented to [facility] on 04/18/22 due to Direct Laryngoscopy with biopsies.Patient was present for 5 hours.   New?Medications Started at [facility] Discharge:?? -started  HYDROcodone-acetaminophen (HYCET)   Medication Changes at Hospital Discharge: none   Medications Discontinued at Hospital Discharge: none   Medications that remain the  same after Hospital  Discharge:??  -All other medications will remain the same.     Objective:  Lab Results  Component Value Date   CREATININE 0.92 06/14/2022   BUN 17 06/14/2022   GFR 83.00 01/09/2022   GFRNONAA >60 06/14/2022   GFRAA 94 05/16/2018   NA 137 06/14/2022   K 3.9 06/14/2022   CALCIUM 8.8 (L) 06/14/2022   CO2 32 06/14/2022   GLUCOSE 105 (H) 06/14/2022    Lab Results  Component Value Date/Time   HGBA1C 5.6 01/09/2022 09:34 AM   GFR 83.00 01/09/2022 09:34 AM   GFR 87.77 04/01/2013 12:48 PM    Last diabetic Eye exam: No results found for: "HMDIABEYEEXA"  Last diabetic Foot exam: No results found for: "HMDIABFOOTEX"   Lab Results  Component Value Date   CHOL 115 01/09/2022   HDL 58.00 01/09/2022   LDLCALC 44 01/09/2022   TRIG 65.0 01/09/2022   CHOLHDL 2 01/09/2022       Latest Ref Rng & Units 05/08/2022   11:26 AM 01/09/2022    9:34 AM 11/22/2020   12:40 PM  Hepatic Function  Total Protein 6.5 - 8.1 g/dL 6.1  6.7  6.1   Albumin 3.5 - 5.0 g/dL 3.7  3.8  3.4   AST 15 - 41 U/L _0 ALT 0 - 44 U/L _1 Alk Phosphatase 38 - 126 U/L 39  43  43   Total Bilirubin 0.3 - 1.2 mg/dL 0.4  0.7  0.9     Lab Results  Component Value Date/Time   TSH 0.798 05/08/2022 11:26 AM   TSH 0.77 03/08/2015 10:24 AM       Latest Ref Rng & Units 06/14/2022    8:31 AM 06/12/2022    9:54 AM 06/07/2022    8:12 AM  CBC  WBC 4.0 - 10.5 K/uL 7.5  9.1  6.1   Hemoglobin 13.0 - 17.0 g/dL 15.6  14.8  16.2   Hematocrit 39.0 - 52.0 % 45.8  42.8  47.0   Platelets 150 - 400 K/uL 134  147  185     No results found for: "VD25OH"  Clinical ASCVD: Yes  The ASCVD Risk score (Arnett DK, et al., 2019) failed to calculate for the following reasons:   The valid total cholesterol range is 130 to 320 mg/dL       12/01/2021    3:05 PM 06/07/2021    1:12 PM 06/07/2021    1:10 PM  Depression screen PHQ 2/9  Decreased Interest 0 0 0  Down, Depressed, Hopeless 0 0 0  PHQ - 2 Score 0 0 0  Altered  sleeping 0    Tired, decreased energy 0    Change in appetite 0    Feeling bad or failure about yourself  0    Trouble concentrating 0    Moving slowly or fidgety/restless 0    Suicidal thoughts 0    PHQ-9 Score 0       CHA2DS2/VAS Stroke Risk Points  Current as of yesterday     3 >= 2 Points: High Risk  1 - 1.99 Points: Medium Risk  0 Points: Low Risk    Last Change: N/A      Details    This score determines the patient's risk of having a stroke if the  patient has atrial fibrillation.       Points Metrics  0 Has Congestive Heart Failure:  No    Current as of yesterday  0 Has Vascular Disease:  No    Current as of yesterday  1 Has Hypertension:  Yes    Current as of yesterday  2 Age:  83    Current as of yesterday  0 Has Diabetes:  No    Current as of yesterday  0 Had Stroke:  No  Had TIA:  No  Had Thromboembolism:  No    Current as of yesterday  0 Male:  No    Current as of yesterday       Social History   Tobacco Use  Smoking Status Every Day   Packs/day: 0.50   Years: 50.00   Total pack years: 25.00   Types: Cigarettes   Passive exposure: Never  Smokeless Tobacco Never   BP Readings from Last 3 Encounters:  06/15/22 (!) 142/70  06/14/22 116/74  06/12/22 139/85   Pulse Readings from Last 3 Encounters:  06/15/22 86  06/14/22 81  06/12/22 (!) 52   Wt Readings from Last 3 Encounters:  06/14/22 214 lb 6.4 oz (97.3 kg)  06/12/22 222 lb (100.7 kg)  06/07/22 221 lb 6 oz (100.4 kg)   BMI Readings from Last 3 Encounters:  06/14/22 26.80 kg/m  06/12/22 27.75 kg/m  06/07/22 26.95 kg/m    Assessment/Interventions: Review of patient past medical history, allergies, medications, health status, including review of consultants reports, laboratory and other test data, was performed as part of comprehensive evaluation and provision of chronic care management services.   SDOH:  (Social Determinants of Health) assessments and interventions performed: Yes  (last 12/13/21) SDOH Interventions    Flowsheet Row Clinical Support from 05/24/2022 in [place] [facility] from 12/13/2021 in [place] at [place] from 06/07/2021 in [place] at [place] Interventions Intervention Not Indicated -- Intervention Not Indicated  Housing Interventions Intervention Not Indicated -- Intervention Not Indicated  Transportation Interventions Intervention Not Indicated Intervention Not Indicated Intervention Not Indicated  Utilities Interventions Intervention Not Indicated -- --  Financial Strain Interventions Intervention Not Indicated Intervention Not Indicated Intervention Not Indicated  Physical Activity Interventions -- -- Intervention Not Indicated  Stress Interventions -- -- Intervention Not Indicated  Social Connections Interventions -- -- Intervention Not Indicated      SDOH Screenings   Food Insecurity: No Food Insecurity (05/24/2022)  Housing: Low Risk  (05/24/2022)  Transportation Needs: No Transportation Needs (05/24/2022)  Utilities: Not At Risk (05/24/2022)  Alcohol Screen: Medium Risk (01/08/2022)  Depression (PHQ2-9): Low Risk  (12/01/2021)  Financial Resource Strain: Low Risk  (05/24/2022)  Physical Activity: Insufficiently Active (01/08/2022)  Social Connections: Moderately Isolated (01/08/2022)  Stress: No Stress Concern Present (01/08/2022)  Tobacco Use: High Risk (06/19/2022)    CCM Care Plan  Allergies  Allergen Reactions   Zithromax [Azithromycin] Diarrhea    Medications Reviewed Today     Reviewed by Viona Gilmore, [facility] (Pharmacist) on 06/19/22 at 1127  Med List Status: <None>   Medication Order Taking? Sig Documenting Provider Last Dose Status Informant  acetaminophen (TYLENOL) 500 MG tablet [id number]  Take 500 mg by mouth every 6 (six) hours as needed (for pain.). [provider]  Active Self   cetirizine (ZYRTEC) 10 MG tablet [id number]  Take 1 tablet (10 mg total) by mouth daily.  Patient taking differently: Take 10 mg by mouth daily as needed for allergies.   Jaynee Eagles, PA-C  Active Self  cholecalciferol (VITAMIN D) 25 MCG (1000 UNIT) tablet [id number]  Take 1,000 Units by mouth 2 (two) times daily. [provider]  Active Self  dexamethasone (DECADRON) 4 MG tablet [id number]  Take 2 tablets daily x 3 days starting the day after cisplatin chemotherapy. Take with food. Benay Pike, MD  Active Self  diltiazem (TIADYLT ER) 120 MG 24 hr capsule [id number]  TAKE 1 CAPSULE EVERY DAY (SCHEDULE OFFICE VISIT FOR FUTURE REFILLS) [name], MD  Active Self  lidocaine (XYLOCAINE) 2 % solution [id number]  Patient: Mix 1part 2% viscous lidocaine, 1part H20. Swish & swallow 28m of diluted mixture, 364m before meals  and at bedtime, up to QID prn sore throat. SqEppie GibsonMD  Active Self           Med Note (FIlda FoilBRKeeneov 7, 2023  9:56 AM) Hasn't needed yet  lidocaine (XYLOCAINE) 2 % solution [id number] 1 part lidocaine with 1 part water, then swish and swallow 10 ml of mixture 30 minutes before meals and at bedtime up to 4 times daily as needed for sore throat SqEppie GibsonMD  Active   lidocaine-prilocaine (EMLA) cream [id number] to affected area once IrBenay PikeMD  Active Self           Med Note (BWilmon PaliMELISSA R   Mon Jun 04, 2022  1:59 PM) New Med - Have not started   metoprolol tartrate (LOPRESSOR) 25 MG tablet [id number] 1 tablet (25 mg total) by mouth 2 (two) times daily as needed.  Patient not taking: Reported on 06/07/2022   LaWarren LacyPA-C  Active Self  nicotine (NICODERM CQ - DOSED IN MG/24 HOURS) 21 mg/24hr patch [id number] Place 1 patch (21 mg total) onto the skin daily. Apply 21 mg patch daily x 6 wk, then 1410match daily x 2 wk, then 7 mg patch daily x 2 wk SquEppie GibsonD Taking Active Self           Med Note  (BLWilmon PaliELISSA R   Mon Jun 04, 2022  2:00 PM)    omeprazole (PRILOSEC) 40 MG capsule [id number] 1 capsule (40 mg total) by mouth daily. Mansouraty, GabTelford NabMD  Active Self  [facility]ansetron ([facility] tablet [id number] 1 tablet (8 mg total) by mouth every 8 (eight) hours as needed for nausea or vomiting. Start on the third day after cisplatin. IruBenay PikeD  Active Self  prochlorperazine (COMPAZINE) 10 MG tablet [id number] 1 tablet (10 mg total) by mouth every 6 (six) hours as needed (Nausea or vomiting). IruBenay PikeD  Active Self  rivaroxaban (XARELTO) 20 MG TABS tablet [id number] Take 1 tablet (20 mg total) by mouth daily with supper. Mansouraty, GabTelford NabMD Taking Active Self  rosuvastatin (CRESTOR) 10 MG tablet [id number] 1 tablet (10 mg total) by mouth daily. BurEulas PostD  Active Self  sodium chloride (OCEAN) 0.65 % SOLN nasal spray [id number] 1 spray into both nostrils as needed for congestion. [provider]  Active Self            Patient Active Problem List   Diagnosis Date Noted   Malignant neoplasm of supraglottis (HCCHominy0/10/2021   Cancer of supraglottis (HCCLinganore9/29/2023   Rib pain on left side 12/01/2021   Chronic sore throat 08/15/2021   Dyslipidemia 07/17/2021   AAA (abdominal aortic aneurysm) (  [place]) 08/29/2020   PAD (peripheral artery disease) ([place]) 06/25/2020   Educated about COVID-19 virus infection 05/12/2020   Tobacco abuse 05/12/2020   Peripheral vascular disease ([place]) 04/25/2020   Abdominal aortic aneurysm (AAA) ([place]) 04/25/2020   Essential hypertension 05/16/2018   Medication management 05/16/2018   Piriformis syndrome of right side 05/06/2018   Allergy 10/29/2017   Recurrent sinusitis 10/29/2017   Atrial fibrillation, transient ([place]) 06/07/2015   Aortic stenosis    ABNORMAL ELECTROCARDIOGRAM 07/14/2009    Immunization History  Administered Date(s) Administered   Fluad Quad(high Dose  65+) 06/27/2020, 04/26/2021   Influenza Whole 07/14/2009   Influenza, High Dose Seasonal PF 08/16/2016, 06/05/2017, 04/17/2022   Influenza,inj,Quad PF,6+ Mos 08/09/2014, 04/29/2018   Influenza-Unspecified 09/23/2015   PFIZER(Purple Top)SARS-COV-2 Vaccination 08/28/2019, 09/18/2019   Pneumococcal Conjugate-13 06/05/2017   Pneumococcal Polysaccharide-23 07/14/2009, 04/01/2013   Td 08/07/2003   Zoster Recombinat (Shingrix) 04/17/2022   Patient reports he is fatigued from radiation and possibly chemo but is feeling good otherwise. He denies side effects from the chemo right now.   Patient reports he is trying to quit smoking. He is using 21 mg patch and has slipped up a bit but on the whole not smoking. He hasn't tried patches before but has used the gum or lozenges before and still has some. When he was smoking, he did so first thing in the morning.   Patient inquired about status of patient assistance for Xarelto. Unable to confirm but will have CPA follow up and call him back.  Conditions to be addressed/monitored:  Hypertension, Hyperlipidemia, Atrial Fibrillation, GERD, Tobacco use, Allergic Rhinitis, and PAD  Conditions addressed this visit: Hypertension, Afib  Care Plan : CCM Pharmacy Care Plan  Updates made by Viona Gilmore, [place] since 06/19/2022 12:00 AM     Problem: Problem: Hypertension, Hyperlipidemia, Atrial Fibrillation, GERD, Tobacco use, Allergic Rhinitis, and PAD      Long-Range Goal: Patient-Specific Goal   Start Date: 12/13/2021  Expected End Date: 12/14/2022  Recent Progress: On track  Priority: High  Note:   Current Barriers:  Unable to independently monitor therapeutic efficacy  Pharmacist Clinical Goal(s):  Patient will achieve adherence to monitoring guidelines and medication adherence to achieve therapeutic efficacy through collaboration with PharmD and provider.   Interventions: 1:1 collaboration with [name], MD regarding development and  update of comprehensive plan of care as evidenced by provider attestation and co-signature Inter-disciplinary care team collaboration (see longitudinal plan of care) Comprehensive medication review performed; medication list updated in electronic medical record  Hypertension (BP goal <130/80) -Not ideally controlled -Current treatment: Metoprolol tartrate 25 mg 1 tablet twice daily as needed  - Appropriate, Effective, Safe, Accessible Diltiazem 120 mg 1 capsule daily - Appropriate, Query effective, Safe, Accessible -Medications previously tried: n/a  -Current home readings: 128/74 - usually in the 120s - 130s range, 70s (arm cuff- checks every other day) -Current dietary habits: never been much of a salt person; home cooking; canned foods not often -Current exercise habits: trying to walk daily -Denies hypotensive/hypertensive symptoms -Educated on BP goals and benefits of medications for prevention of heart attack, stroke and kidney damage; Importance of home blood pressure monitoring; Proper BP monitoring technique; -Counseled to monitor BP at home a couple times a week, document, and provide log at future appointments -Counseled on diet and exercise extensively Recommended to continue current medication  Hyperlipidemia: (LDL goal < 70) -Controlled -Current treatment: Rosuvastatin 10 mg 1 tablet daily - Appropriate, Effective, Safe, Accessible -Medications previously  tried: none  -Current dietary patterns: fried chicken every once in a while;  -Current exercise habits: trying to walk daily; doing stretches -Educated on Cholesterol goals;  Benefits of statin for ASCVD risk reduction; Importance of limiting foods high in cholesterol; -Counseled on diet and exercise extensively Recommended to continue current medication  Atrial Fibrillation (Goal: prevent stroke and major bleeding) -Controlled -CHADSVASC: 3 -Current treatment: Rate control: Metoprolol tartrate 25 mg 1 tablet  twice daily as needed; Diltiazem 120 mg 1 capsule daily - Appropriate, Effective, Safe, Accessible Anticoagulation: Xarelto 20 mg 1 tablet daily with supper - Appropriate, Effective, Safe, Accessible -Medications previously tried: n/a -Home BP and HR readings: 68-80; checking BP and HR a few times a week -Counseled on increased risk of stroke due to Afib and benefits of anticoagulation for stroke prevention; bleeding risk associated with Xarelto and importance of self-monitoring for signs/symptoms of bleeding; avoidance of NSAIDs due to increased bleeding risk with anticoagulants; -Counseled on diet and exercise extensively Recommended to continue current medication Assessed patient finances. Will follow up on submitted PAP.  Allergic rhinitis (Goal: minimize allergies symptoms) -Controlled -Current treatment  Cetirizine 10 mg 1 tablet daily - Appropriate, Effective, Safe, Accessible Saline nasal spray as needed - Appropriate, Effective, Safe, Accessible -Medications previously tried: Flonase nasal spray (ineffective)  -Counseled on diet and exercise extensively Recommended to continue current medication  Pain (Goal: minimize pain) -Controlled -Current treatment  Acetaminophen 500 mg 1 tablet as needed - Appropriate, Effective, Safe, Accessible -Medications previously tried: Tramadol (side effects)  -Counseled on maximum of 3000 mg of Tylenol per day.  Tobacco use (Goal quit smoking) -Controlled -Previous quit attempts: none -Current treatment  Nicotine patches 21 mg 1 patch daily - Appropriate, Query effective, Safe, Accessible -Patient smokes Within 30 minutes of waking -Patient triggers include: drinking coffee -On a scale of 1-10, reports MOTIVATION to quit is 9 -On a scale of 1-10, reports CONFIDENCE in quitting is 9 -Provided contact information for [place] Quit Line (1-800-QUIT-NOW) and encouraged patient to reach out to this group for support. -Recommended the addition of  lozenges or gum for cravings.   Health Maintenance -Vaccine gaps: tetanus, shingrix -Current therapy:  Vitamin D 1000 units 1 tablet daily -Educated on Cost vs benefit of each product must be carefully weighed by individual consumer -Patient is satisfied with current therapy and denies issues -Recommended to continue current medication  Patient Goals/Self-Care Activities Patient will:  - take medications as prescribed as evidenced by patient report and record review check blood pressure a few times a week, document, and provide at future appointments target a minimum of 150 minutes of moderate intensity exercise weekly  Follow Up Plan: The care management team will reach out to the patient again over the next 7 days.          Medication Assistance: None required.  Patient affirms current coverage meets needs.  Compliance/Adherence/Medication fill history: Care Gaps: Hep C screening, tetanus, shingrix (2nd dose) lung cancer screening, AWV BP- 142/70 (06/15/22)   Star-Rating Drugs: Rosuvastatin 10 mg - Last filled 04/29/22 90 DS at [facility]   Patient's preferred pharmacy is:  [facility] ([facility]), [place] - [place] [address] [place] ([place]) [place] [address] Phone: [phone] Fax: [phone]  [facility] [address] [place] [address] Phone: [phone] Fax: [phone]  [facility] [place], [place] [address] Phone: [phone]  Fax: [place] [address] [place] [address] Phone: [phone] Fax: [phone]  Uses pill box? No - doesn't feel like he needs Pt endorses 99% compliance  We discussed: Current pharmacy is preferred with insurance plan and patient is satisfied with pharmacy services Patient decided to: Continue current medication management  strategy  Care Plan and Follow Up Patient Decision:  Patient agrees to Care Plan and Follow-up.  Plan: Telephone follow up appointment with care management team member scheduled for:  7 days  Jeni Salles, PharmD, [place] Pharmacist [place] [facility] [place] [phone]

## 2022-06-20 ENCOUNTER — Ambulatory Visit
Admission: RE | Admit: 2022-06-20 | Discharge: 2022-06-20 | Disposition: A | Discharge status: Home / Self Care | Payer: Medicare HMO | Source: Ambulatory Visit | Attending: Radiation Oncology | Admitting: Radiation Oncology

## 2022-06-20 ENCOUNTER — Other Ambulatory Visit: Payer: Self-pay

## 2022-06-20 DIAGNOSIS — R1310 Dysphagia, unspecified: Secondary | ICD-10-CM | POA: Diagnosis not present

## 2022-06-20 DIAGNOSIS — C321 Malignant neoplasm of supraglottis: Secondary | ICD-10-CM | POA: Diagnosis not present

## 2022-06-20 DIAGNOSIS — Z510 Encounter for antineoplastic radiation therapy: Secondary | ICD-10-CM | POA: Diagnosis not present

## 2022-06-20 LAB — RAD ONC ARIA SESSION SUMMARY
Course Elapsed Days: 21
Plan Fractions Treated to Date: 16
Plan Prescribed Dose Per Fraction: 2 Gy
Plan Total Fractions Prescribed: 35
Plan Total Prescribed Dose: 70 Gy
Reference Point Dosage Given to Date: 32 Gy
Reference Point Session Dosage Given: 2 Gy
Session Number: 16

## 2022-06-21 ENCOUNTER — Inpatient Hospital Stay (HOSPITAL_BASED_OUTPATIENT_CLINIC_OR_DEPARTMENT_OTHER): Payer: Medicare HMO | Admitting: Hematology and Oncology

## 2022-06-21 ENCOUNTER — Encounter: Payer: Self-pay | Admitting: Hematology and Oncology

## 2022-06-21 ENCOUNTER — Ambulatory Visit
Admission: RE | Admit: 2022-06-21 | Discharge: 2022-06-21 | Disposition: A | Discharge status: Home / Self Care | Payer: Medicare HMO | Source: Ambulatory Visit | Attending: Radiation Oncology | Admitting: Radiation Oncology

## 2022-06-21 ENCOUNTER — Other Ambulatory Visit: Payer: Self-pay

## 2022-06-21 ENCOUNTER — Inpatient Hospital Stay: Payer: Medicare HMO

## 2022-06-21 ENCOUNTER — Ambulatory Visit: Payer: Medicare HMO | Attending: Neurology

## 2022-06-21 DIAGNOSIS — R1310 Dysphagia, unspecified: Secondary | ICD-10-CM | POA: Diagnosis not present

## 2022-06-21 DIAGNOSIS — Z95828 Presence of other vascular implants and grafts: Secondary | ICD-10-CM

## 2022-06-21 DIAGNOSIS — C321 Malignant neoplasm of supraglottis: Secondary | ICD-10-CM | POA: Diagnosis not present

## 2022-06-21 DIAGNOSIS — Z510 Encounter for antineoplastic radiation therapy: Secondary | ICD-10-CM | POA: Diagnosis not present

## 2022-06-21 LAB — RAD ONC ARIA SESSION SUMMARY
Course Elapsed Days: 22
Plan Fractions Treated to Date: 17
Plan Prescribed Dose Per Fraction: 2 Gy
Plan Total Fractions Prescribed: 35
Plan Total Prescribed Dose: 70 Gy
Reference Point Dosage Given to Date: 34 Gy
Reference Point Session Dosage Given: 2 Gy
Session Number: 17

## 2022-06-21 LAB — CBC WITH DIFFERENTIAL (CANCER CENTER ONLY)
Abs Immature Granulocytes: 0.03 10*3/uL (ref 0.00–0.07)
Basophils Absolute: 0 10*3/uL (ref 0.0–0.1)
Basophils Relative: 0 %
Eosinophils Absolute: 0.1 10*3/uL (ref 0.0–0.5)
Eosinophils Relative: 1 %
HCT: 41.9 % (ref 39.0–52.0)
Hemoglobin: 14.5 g/dL (ref 13.0–17.0)
Immature Granulocytes: 1 %
Lymphocytes Relative: 7 %
Lymphs Abs: 0.4 10*3/uL — ABNORMAL LOW (ref 0.7–4.0)
MCH: 33 pg (ref 26.0–34.0)
MCHC: 34.6 g/dL (ref 30.0–36.0)
MCV: 95.4 fL (ref 80.0–100.0)
Monocytes Absolute: 0.3 10*3/uL (ref 0.1–1.0)
Monocytes Relative: 5 %
Neutro Abs: 5.7 10*3/uL (ref 1.7–7.7)
Neutrophils Relative %: 86 %
Platelet Count: 130 10*3/uL — ABNORMAL LOW (ref 150–400)
RBC: 4.39 MIL/uL (ref 4.22–5.81)
RDW: 13.5 % (ref 11.5–15.5)
WBC Count: 6.6 10*3/uL (ref 4.0–10.5)
nRBC: 0 % (ref 0.0–0.2)

## 2022-06-21 LAB — BASIC METABOLIC PANEL - CANCER CENTER ONLY
Anion gap: 5 (ref 5–15)
BUN: 21 mg/dL (ref 8–23)
CO2: 32 mmol/L (ref 22–32)
Calcium: 9.1 mg/dL (ref 8.9–10.3)
Chloride: 100 mmol/L (ref 98–111)
Creatinine: 0.93 mg/dL (ref 0.61–1.24)
GFR, Estimated: >60 mL/min (ref >60)
Glucose, Bld: 101 mg/dL — ABNORMAL HIGH (ref 70–99)
Potassium: 3.9 mmol/L (ref 3.5–5.1)
Sodium: 137 mmol/L (ref 135–145)

## 2022-06-21 LAB — MAGNESIUM: Magnesium: 1.9 mg/dL (ref 1.7–2.4)

## 2022-06-21 MED ORDER — SODIUM CHLORIDE 0.9% FLUSH
10.0000 mL | Freq: Once | INTRAVENOUS | Status: AC
  Administered 2022-06-21: 10 mL

## 2022-06-21 MED ORDER — HEPARIN SOD (PORK) LOCK FLUSH 100 UNIT/ML IV SOLN
500.0000 [IU] | Freq: Once | INTRAVENOUS | Status: AC
  Administered 2022-06-21: 500 [IU]

## 2022-06-21 MED FILL — Fosaprepitant Dimeglumine For IV Infusion 150 MG (Base Eq): INTRAVENOUS | Qty: 5 | Status: AC

## 2022-06-21 MED FILL — Dexamethasone Sodium Phosphate Inj 100 MG/10ML: INTRAMUSCULAR | Qty: 1 | Status: AC

## 2022-06-21 NOTE — Progress Notes (Signed)
[facility] NOTE  Patient Care Team: [name], MD as PCP - General [name], MD as PCP - Cardiology (Cardiology) Viona Gilmore, [facility] as Pharmacist (Pharmacist) Malmfelt, Stephani Police, RN as Oncology Nurse Navigator [name], MD as Consulting Physician (Radiation Oncology) Benay Pike, MD as Consulting Physician (Hematology and Oncology)  CHIEF COMPLAINTS/PURPOSE OF CONSULTATION:  SCC supraglottis  ONCOLOGIC HISTORY: Initially presented to ENT with chronic left-sided sore throat for almost a year.  Initial evaluation did not show any possible causes for sore throat.  He was recommended PPI twice daily but went back to ENT with persistent sore throat he once again had a repeat fiberoptic exam which was stable.   CT neck done on November 08, 2021 did show subcentimeter focus of mucosal hyperenhancement along the posterior aspect of the uvula  laryngoscopy with direct visualization recommended to exclude a mucosal lesion at the site.  Additionally few tiny cystic-appearing foci along the left glossotonsillar sulcus.  Direct visualization recommended.  Nonspecific mildly enlarged right level 2 lymph node.  Multiple small calcific foci in the left palatine tonsil.Repeat fiberoptic exam was unremarkable hence Dr. 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  Tongue biopsy showed benign squamous mucosa with low to intermediate grade dysplasia. 05/07/2022: PET/CT showed asymmetric hypermetabolism in the left oropharynx and involving the left epiglottis.  Contralateral hypermetabolic level 2 cervical lymph nodes compatible with metastatic disease.  No hypermetabolic adenopathy in the left neck or in the chest.  1.2 cm short axis aortocaval lymph node in the upper abdomen is  hypermetabolic metastatic disease is a concern.  This lymph node has increased from 0.7 cm on the study from 2022.  No other metastatic disease. Started radiation on 05/30/2022 and weekly cisplatin on 06/01/2022.   INTERIM HISTORY: ARTY LANTZY returns for a toxicity check before his Cycle 4 weekly cisplatin infusion scheduled for tomorrow.  So far he has been tolerating the treatment well. NO side effects from chemotherapy. Sore throat is tolerable. Constipation is better. No change in hearing or neuropathy. He has been drinking a lot of water, urine is pale. Taste is bad, but swallowing ok and eating well. Rest of the pertinent 10 point ROS reviewed and negative  MEDICAL HISTORY:  Past Medical History:  Diagnosis Date   Aortic stenosis    a. Moderate by echo 05/2020.   Arthritis    right hip   Cancer ([place])    skin cancer nose, right left, upper left basel cell   Dysrhythmia    a-fib   PAF (paroxysmal atrial fibrillation) (HCC)    Peripheral arterial disease (HCC)    Tobacco abuse     SURGICAL HISTORY: Past Surgical History:  Procedure Laterality Date   ABDOMINAL AORTIC ENDOVASCULAR STENT GRAFT N/A 08/29/2020   Procedure: ABDOMINAL AORTIC ENDOVASCULAR STENT GRAFT;  Surgeon: Elam Dutch, MD;  Location: [place];  Service: Vascular;  Laterality: N/A;   ABDOMINAL AORTOGRAM W/LOWER EXTREMITY Bilateral 06/24/2020   Procedure: ABDOMINAL AORTOGRAM W/LOWER EXTREMITY;  Surgeon: Elam Dutch, MD;  Location: [place] [facility];  Service: Cardiovascular;  Laterality: Bilateral;   BACK SURGERY     2005   BIOPSY  05/21/2022   Procedure: BIOPSY;  Surgeon: Rush Landmark Telford Nab., MD;  Location: [facility];  Service: Gastroenterology;;   CERVICAL SPINE SURGERY     DIRECT LARYNGOSCOPY Bilateral  04/18/2022   Procedure: DIRECT LARYNGOSCOPY WITH BIOPSIES;  Surgeon: Melida Quitter, MD;  Location: [place];  Service: ENT;  Laterality: Bilateral;   EMBOLECTOMY Right 08/29/2020   Procedure:  POPLITEAL AND TIBIAL EMBOLECTOMY;  Surgeon: Elam Dutch, MD;  Location: [place];  Service: Vascular;  Laterality: Right;   EMBOLIZATION Right 06/24/2020   Procedure: EMBOLIZATION;  Surgeon: Elam Dutch, MD;  Location: [place] [facility];  Service: Cardiovascular;  Laterality: Right;   ENDARTERECTOMY FEMORAL Right 08/29/2020   Procedure: ENDARTERECTOMY FEMORAL;  Surgeon: Elam Dutch, MD;  Location: [facility];  Service: Vascular;  Laterality: Right;   ESOPHAGOGASTRODUODENOSCOPY (EGD) WITH PROPOFOL N/A 05/21/2022   Procedure: ESOPHAGOGASTRODUODENOSCOPY (EGD) WITH PROPOFOL;  Surgeon: Irving Copas., MD;  Location: [facility];  Service: Gastroenterology;  Laterality: N/A;   EUS N/A 05/21/2022   Procedure: UPPER ENDOSCOPIC ULTRASOUND (EUS) RADIAL;  Surgeon: Irving Copas., MD;  Location: [facility];  Service: Gastroenterology;  Laterality: N/A;   EYE SURGERY     FEMORAL ARTERY EXPLORATION Right 06/25/2020   Procedure: RIGHT GROIN EXPLORATION Evacuation of Hematoma  WITH REPAIR OF RIGHT Common FEMORAL ARTERY.;  Surgeon: Angelia Mould, MD;  Location: [place];  Service: Vascular;  Laterality: Right;   FEMORAL-POPLITEAL BYPASS GRAFT Left 11/22/2020   Procedure: LEFT ABOVE KNEE-BELOW KNEE BYPASS TIBIAL PERITONEAL TRUNK REVERSE IPSILATERAL GREATER SAPHENOUS VEIN;  Surgeon: Elam Dutch, MD;  Location: [facility];  Service: Vascular;  Laterality: Left;   FINE NEEDLE ASPIRATION N/A 05/21/2022   Procedure: FINE NEEDLE ASPIRATION (FNA) LINEAR;  Surgeon: Irving Copas., MD;  Location: [facility];  Service: Gastroenterology;  Laterality: N/A;   IR GASTROSTOMY TUBE MOD SED  06/12/2022   IR IMAGING GUIDED PORT INSERTION  06/12/2022   LEG SURGERY     Trauma   LOWER EXTREMITY ANGIOGRAPHY Left 11/17/2020   Procedure: Lower Extremity Angiography;  Surgeon: Marty Heck, MD;  Location: [place] [facility];  Service: Cardiovascular;  Laterality: Left;   PATCH  ANGIOPLASTY Right 08/29/2020   Procedure: PATCH ANGIOPLASTY RIGHT BELOW KNEE POPLITEAL ARTERY AND RIGHT COMMON FEMORAL ARTERY;  Surgeon: Elam Dutch, MD;  Location: [place];  Service: Vascular;  Laterality: Right;   RETINAL DETACHMENT SURGERY     RIGID ESOPHAGOSCOPY  04/18/2022   Procedure: RIGID ESOPHAGOSCOPY;  Surgeon: Melida Quitter, MD;  Location: [place];  Service: ENT;;   TONSILLECTOMY  04/18/2022   Procedure: LEFT TONSILLECTOMY;  Surgeon: Melida Quitter, MD;  Location: [facility] OR;  Service: ENT;;   ULTRASOUND GUIDANCE FOR VASCULAR ACCESS Bilateral 08/29/2020   Procedure: ULTRASOUND GUIDANCE FOR VASCULAR ACCESS;  Surgeon: Elam Dutch, MD;  Location: [facility];  Service: Vascular;  Laterality: Bilateral;    SOCIAL HISTORY: Social History   Socioeconomic History   Marital status: Married    Spouse name: Not on file   Number of children: 5   Years of education: Not on file   Highest education level: Master's degree (e.g., MA, MS, MEng, MEd, MSW, MBA)  Occupational History   Not on file  Tobacco Use   Smoking status: Every Day    Packs/day: 0.50    Years: 50.00    Total pack years: 25.00    Types: Cigarettes    Passive exposure: Never   Smokeless tobacco: Never  Vaping Use   Vaping Use: Never used  Substance and Sexual Activity   Alcohol use: Yes    Alcohol/week: 0.0 standard drinks of alcohol    Comment: 4 to 5  times a week; beer, wine and liquor    Drug use: No   Sexual activity: Not Currently  Other Topics Concern   Not on file  Social History Narrative   Scores reading tests.  Lives with wife.     Social Determinants of Health   Financial Resource Strain: Low Risk  (05/24/2022)   Overall Financial Resource Strain (CARDIA)    Difficulty of Paying Living Expenses: Not hard at all  Food Insecurity: No Food Insecurity (05/24/2022)   Hunger Vital Sign    Worried About Running Out of Food in the Last Year: Never true    Ran Out of Food in the Last Year: Never true   Transportation Needs: No Transportation Needs (05/24/2022)   PRAPARE - Hydrologist (Medical): No    Lack of Transportation (Non-Medical): No  Physical Activity: Insufficiently Active (01/08/2022)   Exercise Vital Sign    Days of Exercise per Week: 3 days    Minutes of Exercise per Session: 30 min  Stress: No Stress Concern Present (01/08/2022)   [place]    Feeling of Stress : Not at all  Social Connections: Moderately Isolated (01/08/2022)   Social Connection and Isolation Panel [NHANES]    Frequency of Communication with Friends and Family: More than three times a week    Frequency of Social Gatherings with Friends and Family: Three times a week    Attends Religious Services: Never    Active Member of Clubs or Organizations: No    Attends Archivist Meetings: Not on file    Marital Status: Married  Human resources officer Violence: Not At Risk (06/07/2021)   Humiliation, Afraid, Rape, and Kick questionnaire    Fear of Current or Ex-Partner: No    Emotionally Abused: No    Physically Abused: No    Sexually Abused: No    FAMILY HISTORY: Family History  Problem Relation Age of Onset   Ovarian cancer Mother    Alcoholism Father    Breast cancer Sister    Ovarian cancer Sister     ALLERGIES:  is allergic to zithromax [azithromycin].  MEDICATIONS:  Current Outpatient Medications  Medication Sig Dispense Refill   acetaminophen (TYLENOL) 500 MG tablet Take 500 mg by mouth every 6 (six) hours as needed (for pain.).     cetirizine (ZYRTEC) 10 MG tablet Take 1 tablet (10 mg total) by mouth daily. (Patient taking differently: Take 10 mg by mouth daily as needed for allergies.) 30 tablet 11   cholecalciferol (VITAMIN D) 25 MCG (1000 UNIT) tablet Take 1,000 Units by mouth 2 (two) times daily.     dexamethasone (DECADRON) 4 MG tablet Take 2 tablets daily x 3 days starting the day after  cisplatin chemotherapy. Take with food. 30 tablet 1   diltiazem (TIADYLT ER) 120 MG 24 hr capsule TAKE 1 CAPSULE EVERY DAY (SCHEDULE OFFICE VISIT FOR FUTURE REFILLS) 90 capsule 1   lidocaine (XYLOCAINE) 2 % solution Patient: Mix 1part 2% viscous lidocaine, 1part H20. Swish & swallow 41m of diluted mixture, 337m before meals  and at bedtime, up to QID prn sore throat. 200 mL 3   lidocaine (XYLOCAINE) 2 % solution Mix 1 part lidocaine with 1 part water, then swish and swallow 10 ml of mixture 30 minutes before meals and at bedtime up to 4 times daily as needed for sore throat 200 mL 0   lidocaine-prilocaine (EMLA) cream Apply to affected area  once 30 g 3   metoprolol tartrate (LOPRESSOR) 25 MG tablet Take 1 tablet (25 mg total) by mouth 2 (two) times daily as needed. (Patient not taking: Reported on 06/07/2022) 30 tablet 1   nicotine (NICODERM CQ - DOSED IN MG/24 HOURS) 21 mg/24hr patch Place 1 patch (21 mg total) onto the skin daily. Apply 21 mg patch daily x 6 wk, then '14mg'$  patch daily x 2 wk, then 7 mg patch daily x 2 wk 14 patch 2   omeprazole (PRILOSEC) 40 MG capsule Take 1 capsule (40 mg total) by mouth daily. 30 capsule 6   ondansetron (ZOFRAN) 8 MG tablet Take 1 tablet (8 mg total) by mouth every 8 (eight) hours as needed for nausea or vomiting. Start on the third day after cisplatin. 30 tablet 1   prochlorperazine (COMPAZINE) 10 MG tablet Take 1 tablet (10 mg total) by mouth every 6 (six) hours as needed (Nausea or vomiting). 30 tablet 1   rivaroxaban (XARELTO) 20 MG TABS tablet Take 1 tablet (20 mg total) by mouth daily with supper. 90 tablet 1   rosuvastatin (CRESTOR) 10 MG tablet Take 1 tablet (10 mg total) by mouth daily. 90 tablet 1   sodium chloride (OCEAN) 0.65 % SOLN nasal spray Place 1 spray into both nostrils as needed for congestion.     No current facility-administered medications for this visit.     PHYSICAL EXAMINATION: ECOG PERFORMANCE STATUS: 1 - Symptomatic but completely  ambulatory  Vitals:   06/21/22 1019  BP: 110/77  Pulse: 65  Resp: 18  Temp: (!) 97.2 F (36.2 C)  SpO2: 96%   Filed Weights   06/21/22 1019  Weight: 214 lb 5 oz (97.2 kg)    Physical Exam Constitutional:      Appearance: Normal appearance.  Cardiovascular:     Rate and Rhythm: Normal rate and regular rhythm.  Pulmonary:     Effort: Pulmonary effort is normal.     Breath sounds: Normal breath sounds.  Abdominal:     General: Abdomen is flat.     Palpations: Abdomen is soft.  Musculoskeletal:        General: No swelling or tenderness. Normal range of motion.     Cervical back: Normal range of motion and neck supple. No rigidity.  Lymphadenopathy:     Cervical: No cervical adenopathy.  Skin:    General: Skin is warm and dry.  Neurological:     General: No focal deficit present.     Mental Status: He is alert.      LABORATORY DATA:  I have reviewed the data as listed Lab Results  Component Value Date   WBC 7.5 06/14/2022   HGB 15.6 06/14/2022   HCT 45.8 06/14/2022   MCV 95.4 06/14/2022   PLT 134 (L) 06/14/2022     Chemistry      Component Value Date/Time   NA 137 06/14/2022 0831   NA 141 05/16/2018 1425   K 3.9 06/14/2022 0831   CL 102 06/14/2022 0831   CO2 32 06/14/2022 0831   BUN 17 06/14/2022 0831   BUN 13 05/16/2018 1425   CREATININE 0.92 06/14/2022 0831      Component Value Date/Time   CALCIUM 8.8 (L) 06/14/2022 0831   ALKPHOS 39 05/08/2022 1126   AST 14 (L) 05/08/2022 1126   ALT 17 05/08/2022 1126   BILITOT 0.4 05/08/2022 1126       RADIOGRAPHIC STUDIES: I have personally reviewed the radiological images as listed and  agreed with the findings in the report. IR Gastrostomy Tube  Result Date: 06/12/2022 INDICATION: 76 year old male with a history of head and neck cancer. He requires percutaneous gastrostomy tube placement prior to undergoing chemo radiation. EXAM: Fluoroscopically guided placement of percutaneous pull-through gastrostomy  tube Interventional Radiologist:  Criselda Peaches, MD MEDICATIONS: 2 g Ancef; Antibiotics were administered within 1 hour of the procedure. ANESTHESIA/SEDATION: Versed 2 mg IV; Fentanyl 100 mcg IV administered by radiology nursing. Moderate Sedation Time:  49 minutes The patient's vital signs and level of consciousness were continuously monitored during the procedure by the interventional radiology nurse under my direct supervision. CONTRAST:  47m OMNIPAQUE IOHEXOL 300 MG/ML  SOLN FLUOROSCOPY: Fluoroscopy Time:  minutes  seconds ( mGy). COMPLICATIONS: None immediate. PROCEDURE: Informed written consent was obtained from the patient after a thorough discussion of the procedural risks, benefits and alternatives. All questions were addressed. Maximal Sterile Barrier Technique was utilized including caps, mask, sterile gowns, sterile gloves, sterile drape, hand hygiene and skin antiseptic. A timeout was performed prior to the initiation of the procedure. Maximal barrier sterile technique utilized including caps, mask, sterile gowns, sterile gloves, large sterile drape, hand hygiene, and chlorhexadine skin prep. An angled catheter was advanced over a wire under fluoroscopic guidance through the nose, down the esophagus and into the body of the stomach. The stomach was then insufflated with several 100 ml of air. Fluoroscopy confirmed location of the gastric bubble, as well as inferior displacement of the barium stained colon. Under direct fluoroscopic guidance, a single T-tack was placed, and the anterior gastric wall drawn up against the anterior abdominal wall. Percutaneous access was then obtained into the mid gastric body with an 18 gauge sheath needle. Aspiration of air, and injection of contrast material under fluoroscopy confirmed needle placement. An Amplatz wire was advanced in the gastric body and the access needle exchanged for a 9-French vascular sheath. A snare device was advanced through the vascular  sheath and an Amplatz wire advanced through the angled catheter. The Amplatz wire was successfully snared and this was pulled up through the esophagus and out the mouth. A 20-French KAlinda DoomsMIC-PEG tube was then connected to the snare and pulled through the mouth, down the esophagus, into the stomach and out to the anterior abdominal wall. Hand injection of contrast material confirmed intragastric location. The T-tack retention suture was then cut. The pull through peg tube was then secured with the external bumper and capped. The patient will be observed for several hours with the newly placed tube on low wall suction to evaluate for any post procedure complication. The patient tolerated the procedure well, there is no immediate complication. IMPRESSION: Successful placement of a 20 French pull through gastrostomy tube. Electronically Signed   By: HJacqulynn CadetM.D.   On: 06/12/2022 13:43   IR IMAGING GUIDED PORT INSERTION  Result Date: 06/12/2022 INDICATION: 76year old male with head and neck cancer. EXAM: IMPLANTED PORT A CATH PLACEMENT WITH ULTRASOUND AND FLUOROSCOPIC GUIDANCE MEDICATIONS: None. ANESTHESIA/SEDATION: None. FLUOROSCOPY: Radiation exposure index: 2 mGy reference air kerma COMPLICATIONS: None immediate. PROCEDURE: The right neck and chest was prepped with chlorhexidine, and draped in the usual sterile fashion using maximum barrier technique (cap and mask, sterile gown, sterile gloves, large sterile sheet, hand hygiene and cutaneous antiseptic). Local anesthesia was attained by infiltration with 1% lidocaine with epinephrine. Ultrasound demonstrated patency of the right internal jugular vein, and this was documented with an image. Under real-time ultrasound guidance, this vein was accessed with  a 21 gauge micropuncture needle and image documentation was performed. A small dermatotomy was made at the access site with an 11 scalpel. A 0.018" wire was advanced into the SVC and the access  needle exchanged for a 30F micropuncture vascular sheath. The 0.018" wire was then removed and a 0.035" wire advanced into the IVC. An appropriate location for the subcutaneous reservoir was selected below the clavicle and an incision was made through the skin and underlying soft tissues. The subcutaneous tissues were then dissected using a combination of blunt and sharp surgical technique and a pocket was formed. A single lumen power injectable portacatheter was then tunneled through the subcutaneous tissues from the pocket to the dermatotomy and the port reservoir placed within the subcutaneous pocket. The venous access site was then serially dilated and a peel away vascular sheath placed over the wire. The wire was removed and the port catheter advanced into position under fluoroscopic guidance. The catheter tip is positioned in the superior cavoatrial junction. This was documented with a spot image. The portacatheter was then tested and found to flush and aspirate well. The port was flushed with saline followed by 100 units/mL heparinized saline. The pocket was then closed in two layers using first subdermal inverted interrupted absorbable sutures followed by a running subcuticular suture. The epidermis was then sealed with Dermabond. The dermatotomy at the venous access site was also closed with Dermabond. IMPRESSION: Successful placement of a right IJ approach Power Port with ultrasound and fluoroscopic guidance. The catheter is ready for use. Electronically Signed   By: Jacqulynn Cadet M.D.   On: 06/12/2022 13:40    ASSESSMENT AND PLAN: YASIN DUCAT is a 76 y.o. male who presents to the clinic for a follow up for SCC of supraglottis.  #SCC of supraglottis: --Started concurrent chemoradiation. Radiation was started on 05/30/2022. Weekly cisplatin started on 06/01/2022.  --Patient presents today for a toxicity evaluation before planned Cycle 4 of cisplatin scheduled for tomorrow.  --Labs from today  pending --Patient has no prohibitive toxicities for treatment so can proceed without any dose modifications.  --RTC in one week with labs, f/u visit with Dr. Chryl Heck before cycle 5   #Sore Throat/Dysphagia: --He has viscous lidocaine but hasn't been using it. --Mild sore throat so far --He will call us if the pain gets worse  #Lymph node biopsy: --Pathology are concerning for B-cell lymphoproliferative process.  --Repeat biopsy scheduled on 11/22. No B symptoms. --Plan CBC on 11/21 to make sure he is good to go for the procedure. Sent a message to our NN regarding this.  All questions were answered. The patient knows to call the clinic with any problems, questions or concerns.  I have spent a total of 30 minutes minutes of face-to-face and non-face-to-face time, preparing to see the patient, performing a medically appropriate examination, counseling and educating the patient, documenting clinical information in the electronic health record, and care coordination.    Benay Pike MD

## 2022-06-21 NOTE — Therapy (Addendum)
OUTPATIENT SPEECH LANGUAGE PATHOLOGY TREATMENT   Patient Name: IZZAK [name] MRN: [id number] DOB:October 23, 1945, 76 y.o., male Today's Date: 06/21/2022  PCP: Carolann Littler, MD REFERRING PROVIDER: [name], MD   End of Session - 06/21/22 1121     Visit Number 2    Number of Visits 7    Date for SLP Re-Evaluation 08/22/22    SLP Start Time 1058    SLP Stop Time  1122    SLP Time Calculation (min) 24 min    Activity Tolerance Patient tolerated treatment well              Past Medical History:  Diagnosis Date   Aortic stenosis    a. Moderate by echo 05/2020.   Arthritis    right hip   Cancer ([place])    skin cancer nose, right left, upper left basel cell   Dysrhythmia    a-fib   PAF (paroxysmal atrial fibrillation) (HCC)    Peripheral arterial disease (HCC)    Tobacco abuse    Past Surgical History:  Procedure Laterality Date   ABDOMINAL AORTIC ENDOVASCULAR STENT GRAFT N/A 08/29/2020   Procedure: ABDOMINAL AORTIC ENDOVASCULAR STENT GRAFT;  Surgeon: Elam Dutch, MD;  Location: [place];  Service: Vascular;  Laterality: N/A;   ABDOMINAL AORTOGRAM W/LOWER EXTREMITY Bilateral 06/24/2020   Procedure: ABDOMINAL AORTOGRAM W/LOWER EXTREMITY;  Surgeon: Elam Dutch, MD;  Location: [place] [facility];  Service: Cardiovascular;  Laterality: Bilateral;   BACK SURGERY     2005   BIOPSY  05/21/2022   Procedure: BIOPSY;  Surgeon: Rush Landmark Telford Nab., MD;  Location: [facility];  Service: Gastroenterology;;   CERVICAL SPINE SURGERY     DIRECT LARYNGOSCOPY Bilateral 04/18/2022   Procedure: DIRECT LARYNGOSCOPY WITH BIOPSIES;  Surgeon: Melida Quitter, MD;  Location: [place];  Service: ENT;  Laterality: Bilateral;   EMBOLECTOMY Right 08/29/2020   Procedure: POPLITEAL AND TIBIAL EMBOLECTOMY;  Surgeon: Elam Dutch, MD;  Location: [place];  Service: Vascular;  Laterality: Right;   EMBOLIZATION Right 06/24/2020   Procedure: EMBOLIZATION;  Surgeon: Elam Dutch, MD;   Location: [place] [facility];  Service: Cardiovascular;  Laterality: Right;   ENDARTERECTOMY FEMORAL Right 08/29/2020   Procedure: ENDARTERECTOMY FEMORAL;  Surgeon: Elam Dutch, MD;  Location: [facility] OR;  Service: Vascular;  Laterality: Right;   ESOPHAGOGASTRODUODENOSCOPY (EGD) WITH PROPOFOL N/A 05/21/2022   Procedure: ESOPHAGOGASTRODUODENOSCOPY (EGD) WITH PROPOFOL;  Surgeon: Irving Copas., MD;  Location: [facility];  Service: Gastroenterology;  Laterality: N/A;   EUS N/A 05/21/2022   Procedure: UPPER ENDOSCOPIC ULTRASOUND (EUS) RADIAL;  Surgeon: Irving Copas., MD;  Location: [facility];  Service: Gastroenterology;  Laterality: N/A;   EYE SURGERY     FEMORAL ARTERY EXPLORATION Right 06/25/2020   Procedure: RIGHT GROIN EXPLORATION Evacuation of Hematoma  WITH REPAIR OF RIGHT Common FEMORAL ARTERY.;  Surgeon: Angelia Mould, MD;  Location: [place];  Service: Vascular;  Laterality: Right;   FEMORAL-POPLITEAL BYPASS GRAFT Left 11/22/2020   Procedure: LEFT ABOVE KNEE-BELOW KNEE BYPASS TIBIAL PERITONEAL TRUNK REVERSE IPSILATERAL GREATER SAPHENOUS VEIN;  Surgeon: Elam Dutch, MD;  Location: [facility] OR;  Service: Vascular;  Laterality: Left;   FINE NEEDLE ASPIRATION N/A 05/21/2022   Procedure: FINE NEEDLE ASPIRATION (FNA) LINEAR;  Surgeon: Irving Copas., MD;  Location: [facility];  Service: Gastroenterology;  Laterality: N/A;   IR GASTROSTOMY TUBE MOD SED  06/12/2022   IR IMAGING GUIDED PORT INSERTION  06/12/2022   LEG SURGERY  Trauma   LOWER EXTREMITY ANGIOGRAPHY Left 11/17/2020   Procedure: Lower Extremity Angiography;  Surgeon: Marty Heck, MD;  Location: [place] [facility];  Service: Cardiovascular;  Laterality: Left;   PATCH ANGIOPLASTY Right 08/29/2020   Procedure: PATCH ANGIOPLASTY RIGHT BELOW KNEE POPLITEAL ARTERY AND RIGHT COMMON FEMORAL ARTERY;  Surgeon: Elam Dutch, MD;  Location: [facility];  Service: Vascular;  Laterality: Right;    RETINAL DETACHMENT SURGERY     RIGID ESOPHAGOSCOPY  04/18/2022   Procedure: RIGID ESOPHAGOSCOPY;  Surgeon: Melida Quitter, MD;  Location: [place];  Service: ENT;;   TONSILLECTOMY  04/18/2022   Procedure: LEFT TONSILLECTOMY;  Surgeon: Melida Quitter, MD;  Location: [place];  Service: ENT;;   ULTRASOUND GUIDANCE FOR VASCULAR ACCESS Bilateral 08/29/2020   Procedure: ULTRASOUND GUIDANCE FOR VASCULAR ACCESS;  Surgeon: Elam Dutch, MD;  Location: [place];  Service: Vascular;  Laterality: Bilateral;   Patient Active Problem List   Diagnosis Date Noted   Port-A-Cath in place 06/21/2022   Malignant neoplasm of supraglottis ([place]) 05/08/2022   Cancer of supraglottis ([place]) 05/04/2022   Rib pain on left side 12/01/2021   Chronic sore throat 08/15/2021   Dyslipidemia 07/17/2021   AAA (abdominal aortic aneurysm) ([place]) 08/29/2020   PAD (peripheral artery disease) ([place]) 06/25/2020   Educated about COVID-19 virus infection 05/12/2020   Tobacco abuse 05/12/2020   Peripheral vascular disease ([place]) 04/25/2020   Abdominal aortic aneurysm (AAA) ([place]) 04/25/2020   Essential hypertension 05/16/2018   Medication management 05/16/2018   Piriformis syndrome of right side 05/06/2018   Allergy 10/29/2017   Recurrent sinusitis 10/29/2017   Atrial fibrillation, transient ([place]) 06/07/2015   Aortic stenosis    ABNORMAL ELECTROCARDIOGRAM 07/14/2009    ONSET DATE: See "pertinent history"   REFERRING DIAG: Malignant neoplasm of supraglottis, Squamous cell, Stage IVA   THERAPY DIAG:  Dysphagia, unspecified type  Rationale for Evaluation and Treatment Rehabilitation  SUBJECTIVE:   SUBJECTIVE STATEMENT: Eaten sausage egg avacado english muffin, pork chops, yams, asparagus in past 24 hours.  Pt accompanied by: self  PERTINENT HISTORY:  He presented to Dr. Redmond Baseman on 08/15/21 with c/o persistent left sided sore throat since August 2022. Laryngoscopy completed at this time was unremarkable. 11/08/21 CT neck revealed a  sub-centimeter focus of mucosal hyperenhancement along the posterior aspect of the uvula, and a few tiny cystic appearing foci along the left glosso-tonsillar sulcus. CT also showed a nonspecific mildly enlarged right level 2 lymph node measuring 14 mm in the short axis, and multiple small calcific foci within the left palatine tonsil possibly reflective of post-inflammatory calcifications and/or tonsilloliths. 11/21/21 repeat Laryngoscopy by Dr. Redmond Baseman again was unremarkable. Due to persistent throat pain Dr. Redmond Baseman advised proceeding with a direct laryngoscopy and rigid esophagosopy with biopsies. 04/18/22 Left laryngeal biopsies showed invasive moderately differentiated SCC.  05/07/22 PET completed showing Asymmetric Hypermetabolism in the left oropharynx and involving the left epiglottis. Imaging features compatible with primary neoplasm. (I believe some of the oropharynx SUV uptake could be r/t abscess, recent biopsy), Contralateral (right-sided) hypermetabolic level II cervical lymph nodes compatible with metastatic disease. No hypermetabolic lymphadenopathy in the left neck, and 1.2 cm short axis aortocaval lymph node in the upper abdomen is hypermetabolic. Metastatic disease is a concern. This lymph node has increased from 0.7 cm on a study from 2022. (I've messaged GI providers to see if this could be bx'd by endoscopy).  05/21/22 Biopsy of the lymph node in his abdomen  with CT simulation planned for 05/23/22.  Treatment plan will depend on results of the biopsy on 10/16 but will most likely be weekly chemo and 35 fractions of radiation. Treatment plan:  He will receive 35 fractions of radiation and weekly chemotherapy to his larynx and bilateral neck.  He is scheduled to start on 05/30/22 and complete 07/19/22. PEG/PAC pending.  PAIN:  Are you having pain? Yes: NPRS scale: 3/10 Pain location: PEG site Pain description: Sore   PATIENT GOALS: Maintain WNL swallowing  OBJECTIVE:   TODAY'S  TREATMENT: 06/21/22: Pt not performing HEP at prescribed frequency. SLP reminded pt at least x10 reps twice a day was target completion. Pt told SLP rationale for HEP without cues. Procedure with HEP today was WNL - pt was independent. SLP explained/reminded pt to cycle through HEP if he needs to do so before next ST appointment. SLP educated pt on overt s/sx aspiration PNA.  Pt ate ham sandwich and drank water without any overt s/sx oral or pharyngeal deficits; he reports no overt s/sx dysphagia with POs to date.    05/24/22: Research states the risk for dysphagia increases due to radiation and/or chemotherapy treatment due to a variety of factors, so SLP educated the pt about the possibility of reduced/limited ability for PO intake during rad tx. SLP also educated pt regarding possible changes to swallowing musculature after rad tx, and why adherence to dysphagia HEP provided today and PO consumption was necessary to inhibit muscle fibrosis following rad tx and to mitigate muscle disuse atrophy. SLP informed pt why this would be detrimental to their swallowing status and to their pulmonary health. Pt demonstrated understanding of these things to SLP. SLP encouraged pt to safely eat and drink as deep into their radiation/chemotherapy as possible to provide the best possible long-term swallowing outcome for pt.    SLP then developed an individualized HEP for pt involving oral and pharyngeal and vocal  strengthening and ROM and pt was instructed how to perform these exercises, including SLP demonstration. After SLP demonstration, pt return demonstrated each exercise. SLP ensured pt performance was correct prior to educating pt on next exercise. Pt required min cues faded to modified independent to perform HEP. Pt was instructed to complete this program 6-7 days/week, at least 2 times a day until 6 months after his or her last day of rad tx, and then x2 a week after that, indefinitely. Modifications for days  when pt cannot perform HEP as prescribed include cycle through the full program of exercises instead of fatiguing on one of the swallowing exercises and being unable to perform the others. SLP instructed that swallowing exercises should then be increased back as pt is able to do so. Secondly, pt was told that former patients have told SLP that during their course of radiation therapy, taking prescribed pain medication just prior to performing HEP (and eating/drinking) has proven helpful in completing HEP (and eating and drinking) more regularly when going through their course of radiation treatment.   PATIENT EDUCATION: Education details: late effects head/neck radiation on swallow function, HEP procedure, and modification to HEP when difficulty experienced with swallowing during and after radiation course Person educated: Patient Education method: Explanation, Demonstration, Verbal cues, and Handouts Education comprehension: verbalized understanding, returned demonstration, verbal cues required, and needs further education   ASSESSMENT:  CLINICAL IMPRESSION: Patient is a 76 y.o. male who was seen today for treatment of swallowing as they undergo radiation/chemoradiation therapy. Today pt ate items from Dys [employer] and drank thin liquids without overt s/s oral or  pharyngeal difficulty. At this time pt swallowing is deemed WNL/WFL with these POs. No oral or overt s/sx pharyngeal deficits, including aspiration were observed. Pt is not completing HEP to prescribed frequency. There are no overt s/s aspiration PNA observed by SLP nor any reported by pt at this time. Data indicate that pt's swallow ability will likely decrease over the course of radiation/chemoradiation therapy and could very well decline over time following the conclusion of that therapy due to muscle disuse atrophy and/or muscle fibrosis. Pt will cont to need to be seen by SLP in order to assess safety of PO intake, assess the need for  recommending any objective swallow assessment, and ensuring pt is correctly completing the individualized HEP.  OBJECTIVE IMPAIRMENTS: include dysphagia. These impairments are limiting patient from safety when swallowing. Factors affecting potential to achieve goals and functional outcome are  none . Patient will benefit from skilled SLP services to address above impairments and improve overall function.  REHAB POTENTIAL: Good    GOALS: Goals reviewed with patient? No   SHORT TERM GOALS: Target date:  2 therapy visits (visit #3)       pt will complete HEP with rare min A  Baseline: Goal status: Met   2.  pt will tell SLP why pt is completing HEP with modified independence Baseline:  Goal status: Met   3.  pt will describe 3 overt s/s aspiration PNA with modified independence Baseline:  Goal status: Met     LONG TERM GOALS: Target date:  6 therapy visits (visit #7)      pt will complete HEP with modified independnence in 2 sessions Baseline:  Goal status: Ongoing   2.  pt will describe how to modify HEP over time, and the timeline associated with reduction in HEP frequency with modified independence over two sessions Baseline:  Goal status: Ongoing     PLAN: SLP FREQUENCY:  once every approx 4 weeks   SLP DURATION:  7 total sessions   PLANNED INTERVENTIONS: Aspiration precaution training, Pharyngeal strengthening exercises, Diet toleration management , Trials of upgraded texture/liquids, Internal/external aids, SLP instruction and feedback, Compensatory strategies, and Patient/family education     [facility], [place] 06/21/2022, 11:22 AM

## 2022-06-22 ENCOUNTER — Inpatient Hospital Stay: Payer: Medicare HMO

## 2022-06-22 ENCOUNTER — Other Ambulatory Visit: Payer: Self-pay

## 2022-06-22 ENCOUNTER — Inpatient Hospital Stay: Payer: Medicare HMO | Admitting: Dietician

## 2022-06-22 ENCOUNTER — Ambulatory Visit
Admission: RE | Admit: 2022-06-22 | Discharge: 2022-06-22 | Disposition: A | Discharge status: Home / Self Care | Payer: Medicare HMO | Source: Ambulatory Visit | Attending: Radiation Oncology | Admitting: Radiation Oncology

## 2022-06-22 VITALS — BP 110/66 | HR 72 | Temp 98.0°F | Resp 17

## 2022-06-22 DIAGNOSIS — C321 Malignant neoplasm of supraglottis: Secondary | ICD-10-CM | POA: Diagnosis not present

## 2022-06-22 DIAGNOSIS — Z510 Encounter for antineoplastic radiation therapy: Secondary | ICD-10-CM | POA: Diagnosis not present

## 2022-06-22 LAB — RAD ONC ARIA SESSION SUMMARY
Course Elapsed Days: 23
Plan Fractions Treated to Date: 18
Plan Prescribed Dose Per Fraction: 2 Gy
Plan Total Fractions Prescribed: 35
Plan Total Prescribed Dose: 70 Gy
Reference Point Dosage Given to Date: 36 Gy
Reference Point Session Dosage Given: 2 Gy
Session Number: 18

## 2022-06-22 MED ORDER — SODIUM CHLORIDE 0.9% FLUSH
10.0000 mL | INTRAVENOUS | Status: DC | PRN
  Administered 2022-06-22: 10 mL

## 2022-06-22 MED ORDER — MAGNESIUM SULFATE 2 GM/50ML IV SOLN
2.0000 g | Freq: Once | INTRAVENOUS | Status: AC
  Administered 2022-06-22: 2 g via INTRAVENOUS
  Filled 2022-06-22: qty 50

## 2022-06-22 MED ORDER — SODIUM CHLORIDE 0.9 % IV SOLN
150.0000 mg | Freq: Once | INTRAVENOUS | Status: AC
  Administered 2022-06-22: 150 mg via INTRAVENOUS
  Filled 2022-06-22: qty 150

## 2022-06-22 MED ORDER — POTASSIUM CHLORIDE IN NACL 20-0.9 MEQ/L-% IV SOLN
Freq: Once | INTRAVENOUS | Status: AC
  Filled 2022-06-22: qty 1000

## 2022-06-22 MED ORDER — SODIUM CHLORIDE 0.9 % IV SOLN
40.0000 mg/m2 | Freq: Once | INTRAVENOUS | Status: AC
  Administered 2022-06-22: 91 mg via INTRAVENOUS
  Filled 2022-06-22: qty 91

## 2022-06-22 MED ORDER — PALONOSETRON HCL INJECTION 0.25 MG/5ML
0.2500 mg | Freq: Once | INTRAVENOUS | Status: AC
  Administered 2022-06-22: 0.25 mg via INTRAVENOUS
  Filled 2022-06-22: qty 5

## 2022-06-22 MED ORDER — SODIUM CHLORIDE 0.9 % IV SOLN
10.0000 mg | Freq: Once | INTRAVENOUS | Status: AC
  Administered 2022-06-22: 10 mg via INTRAVENOUS
  Filled 2022-06-22: qty 10

## 2022-06-22 MED ORDER — HEPARIN SOD (PORK) LOCK FLUSH 100 UNIT/ML IV SOLN
500.0000 [IU] | Freq: Once | INTRAVENOUS | Status: AC | PRN
  Administered 2022-06-22: 500 [IU]

## 2022-06-22 MED ORDER — SODIUM CHLORIDE 0.9 % IV SOLN: Freq: Once | INTRAVENOUS | Status: AC

## 2022-06-22 NOTE — Progress Notes (Signed)
Nutrition Follow-up:  Pt with SCC of supraglottis. S/p PEG 11/7. Lymph node biopsy revealing atypical lymphocytes concerning for metastatic disease under workup. He is receiving concurrent chemoradiation with weekly cisplatin (first treatment 10/27).     EGD planned 11/22  Met with patient during infusion. He reports tolerating treatment well overall. Patient has mild sore throat. He is eating and drinking without difficulty. Taste of some foods is diminished. Recalls pork chops for dinner last night tasted delicious, but the gravy just tasted like grease. He is doing baking soda salt water rinses. Constipation has resolved. He says he fixed that with pinto beans. Patient denies nausea, vomiting, diarrhea. He is not sleeping well. Says the feeding tube is bothersome. He is flushing with water.   Medications: reviewed   Labs: reviewed   Anthropometrics: Wt 214 lb 5 oz today stable x 1 week  11/9 - 214 lb 6.4 oz  11/3 - 221 lb 6 oz  10/27 - 220 lb 14.4 oz    Estimated Energy Needs   Kcals: 2500-3000 Protein: 130-150 Fluid: >3L  NUTRITION DIAGNOSIS: Predicted suboptimal intake ongoing   INTERVENTION:  Continue high calorie, high protein foods that are easy to chew/swallow Continue 2 Ensure Plus/equivalent  Continue daily water flush    MONITORING, EVALUATION, GOAL: weight trends, intake   NEXT VISIT: Wednesday November 29 during infusion

## 2022-06-22 NOTE — Progress Notes (Signed)
Pt to Rad Onc with NT in stable condition.  Post hydration fluids infusing

## 2022-06-23 ENCOUNTER — Other Ambulatory Visit: Payer: Self-pay

## 2022-06-24 ENCOUNTER — Ambulatory Visit
Admission: RE | Admit: 2022-06-24 | Discharge: 2022-06-24 | Disposition: A | Discharge status: Home / Self Care | Payer: Medicare HMO | Source: Ambulatory Visit | Attending: Radiation Oncology | Admitting: Radiation Oncology

## 2022-06-24 ENCOUNTER — Other Ambulatory Visit: Payer: Self-pay

## 2022-06-24 DIAGNOSIS — C321 Malignant neoplasm of supraglottis: Secondary | ICD-10-CM | POA: Diagnosis not present

## 2022-06-24 DIAGNOSIS — Z510 Encounter for antineoplastic radiation therapy: Secondary | ICD-10-CM | POA: Diagnosis not present

## 2022-06-24 LAB — RAD ONC ARIA SESSION SUMMARY
Course Elapsed Days: 25
Plan Fractions Treated to Date: 19
Plan Prescribed Dose Per Fraction: 2 Gy
Plan Total Fractions Prescribed: 35
Plan Total Prescribed Dose: 70 Gy
Reference Point Dosage Given to Date: 38 Gy
Reference Point Session Dosage Given: 2 Gy
Session Number: 19

## 2022-06-25 ENCOUNTER — Other Ambulatory Visit: Payer: Self-pay

## 2022-06-25 ENCOUNTER — Ambulatory Visit
Admission: RE | Admit: 2022-06-25 | Discharge: 2022-06-25 | Disposition: A | Discharge status: Home / Self Care | Payer: Medicare HMO | Source: Ambulatory Visit | Attending: Radiation Oncology | Admitting: Radiation Oncology

## 2022-06-25 ENCOUNTER — Ambulatory Visit: Payer: Medicare HMO

## 2022-06-25 DIAGNOSIS — C321 Malignant neoplasm of supraglottis: Secondary | ICD-10-CM | POA: Diagnosis not present

## 2022-06-25 DIAGNOSIS — Z510 Encounter for antineoplastic radiation therapy: Secondary | ICD-10-CM | POA: Diagnosis not present

## 2022-06-25 LAB — RAD ONC ARIA SESSION SUMMARY
Course Elapsed Days: 26
Plan Fractions Treated to Date: 20
Plan Prescribed Dose Per Fraction: 2 Gy
Plan Total Fractions Prescribed: 35
Plan Total Prescribed Dose: 70 Gy
Reference Point Dosage Given to Date: 40 Gy
Reference Point Session Dosage Given: 2 Gy
Session Number: 20

## 2022-06-25 MED ORDER — SONAFINE EX EMUL: 1.0000 | Freq: Two times a day (BID) | CUTANEOUS | Status: DC

## 2022-06-26 ENCOUNTER — Inpatient Hospital Stay: Payer: Medicare HMO

## 2022-06-26 ENCOUNTER — Ambulatory Visit
Admission: RE | Admit: 2022-06-26 | Discharge: 2022-06-26 | Disposition: A | Discharge status: Home / Self Care | Payer: Medicare HMO | Source: Ambulatory Visit | Attending: Radiation Oncology | Admitting: Radiation Oncology

## 2022-06-26 ENCOUNTER — Other Ambulatory Visit: Payer: Self-pay

## 2022-06-26 ENCOUNTER — Ambulatory Visit: Payer: Medicare HMO | Admitting: Hematology and Oncology

## 2022-06-26 ENCOUNTER — Other Ambulatory Visit: Payer: Medicare HMO

## 2022-06-26 DIAGNOSIS — Z510 Encounter for antineoplastic radiation therapy: Secondary | ICD-10-CM | POA: Diagnosis not present

## 2022-06-26 DIAGNOSIS — C8599 Non-Hodgkin lymphoma, unspecified, extranodal and solid organ sites: Secondary | ICD-10-CM | POA: Diagnosis not present

## 2022-06-26 DIAGNOSIS — C321 Malignant neoplasm of supraglottis: Secondary | ICD-10-CM | POA: Diagnosis not present

## 2022-06-26 DIAGNOSIS — K449 Diaphragmatic hernia without obstruction or gangrene: Secondary | ICD-10-CM | POA: Diagnosis not present

## 2022-06-26 DIAGNOSIS — C8590 Non-Hodgkin lymphoma, unspecified, unspecified site: Secondary | ICD-10-CM | POA: Diagnosis not present

## 2022-06-26 DIAGNOSIS — F17210 Nicotine dependence, cigarettes, uncomplicated: Secondary | ICD-10-CM | POA: Diagnosis not present

## 2022-06-26 DIAGNOSIS — F17200 Nicotine dependence, unspecified, uncomplicated: Secondary | ICD-10-CM | POA: Diagnosis not present

## 2022-06-26 DIAGNOSIS — K31819 Angiodysplasia of stomach and duodenum without bleeding: Secondary | ICD-10-CM | POA: Diagnosis not present

## 2022-06-26 DIAGNOSIS — K2289 Other specified disease of esophagus: Secondary | ICD-10-CM | POA: Diagnosis not present

## 2022-06-26 DIAGNOSIS — I4891 Unspecified atrial fibrillation: Secondary | ICD-10-CM | POA: Diagnosis not present

## 2022-06-26 DIAGNOSIS — I10 Essential (primary) hypertension: Secondary | ICD-10-CM | POA: Diagnosis not present

## 2022-06-26 DIAGNOSIS — I739 Peripheral vascular disease, unspecified: Secondary | ICD-10-CM | POA: Diagnosis not present

## 2022-06-26 DIAGNOSIS — Z95828 Presence of other vascular implants and grafts: Secondary | ICD-10-CM

## 2022-06-26 DIAGNOSIS — Z931 Gastrostomy status: Secondary | ICD-10-CM | POA: Diagnosis not present

## 2022-06-26 DIAGNOSIS — I899 Noninfective disorder of lymphatic vessels and lymph nodes, unspecified: Secondary | ICD-10-CM | POA: Diagnosis not present

## 2022-06-26 DIAGNOSIS — Z79899 Other long term (current) drug therapy: Secondary | ICD-10-CM | POA: Diagnosis not present

## 2022-06-26 DIAGNOSIS — R599 Enlarged lymph nodes, unspecified: Secondary | ICD-10-CM | POA: Diagnosis not present

## 2022-06-26 LAB — COMPREHENSIVE METABOLIC PANEL
ALT: 30 U/L (ref 0–44)
AST: 17 U/L (ref 15–41)
Albumin: 3.4 g/dL — ABNORMAL LOW (ref 3.5–5.0)
Alkaline Phosphatase: 36 U/L — ABNORMAL LOW (ref 38–126)
Anion gap: 3 — ABNORMAL LOW (ref 5–15)
BUN: 21 mg/dL (ref 8–23)
CO2: 32 mmol/L (ref 22–32)
Calcium: 8.6 mg/dL — ABNORMAL LOW (ref 8.9–10.3)
Chloride: 98 mmol/L (ref 98–111)
Creatinine, Ser: 0.77 mg/dL (ref 0.61–1.24)
GFR, Estimated: >60 mL/min (ref >60)
Glucose, Bld: 101 mg/dL — ABNORMAL HIGH (ref 70–99)
Potassium: 3.7 mmol/L (ref 3.5–5.1)
Sodium: 133 mmol/L — ABNORMAL LOW (ref 135–145)
Total Bilirubin: 0.6 mg/dL (ref 0.3–1.2)
Total Protein: 5.5 g/dL — ABNORMAL LOW (ref 6.5–8.1)

## 2022-06-26 LAB — CBC WITH DIFFERENTIAL (CANCER CENTER ONLY)
Abs Immature Granulocytes: 0.02 10*3/uL (ref 0.00–0.07)
Basophils Absolute: 0 10*3/uL (ref 0.0–0.1)
Basophils Relative: 0 %
Eosinophils Absolute: 0.1 10*3/uL (ref 0.0–0.5)
Eosinophils Relative: 1 %
HCT: 38 % — ABNORMAL LOW (ref 39.0–52.0)
Hemoglobin: 13.3 g/dL (ref 13.0–17.0)
Immature Granulocytes: 1 %
Lymphocytes Relative: 11 %
Lymphs Abs: 0.5 10*3/uL — ABNORMAL LOW (ref 0.7–4.0)
MCH: 33.3 pg (ref 26.0–34.0)
MCHC: 35 g/dL (ref 30.0–36.0)
MCV: 95.2 fL (ref 80.0–100.0)
Monocytes Absolute: 0.2 10*3/uL (ref 0.1–1.0)
Monocytes Relative: 5 %
Neutro Abs: 3.5 10*3/uL (ref 1.7–7.7)
Neutrophils Relative %: 82 %
Platelet Count: 78 10*3/uL — ABNORMAL LOW (ref 150–400)
RBC: 3.99 MIL/uL — ABNORMAL LOW (ref 4.22–5.81)
RDW: 13.4 % (ref 11.5–15.5)
WBC Count: 4.2 10*3/uL (ref 4.0–10.5)
nRBC: 0 % (ref 0.0–0.2)

## 2022-06-26 LAB — RAD ONC ARIA SESSION SUMMARY
Course Elapsed Days: 27
Plan Fractions Treated to Date: 21
Plan Prescribed Dose Per Fraction: 2 Gy
Plan Total Fractions Prescribed: 35
Plan Total Prescribed Dose: 70 Gy
Reference Point Dosage Given to Date: 42 Gy
Reference Point Session Dosage Given: 2 Gy
Session Number: 21

## 2022-06-26 MED ORDER — HEPARIN SOD (PORK) LOCK FLUSH 100 UNIT/ML IV SOLN
500.0000 [IU] | Freq: Once | INTRAVENOUS | Status: AC
  Administered 2022-06-26: 500 [IU]

## 2022-06-26 MED ORDER — SODIUM CHLORIDE 0.9% FLUSH
10.0000 mL | Freq: Once | INTRAVENOUS | Status: AC
  Administered 2022-06-26: 10 mL

## 2022-06-27 ENCOUNTER — Encounter (HOSPITAL_COMMUNITY): Payer: Self-pay | Admitting: Gastroenterology

## 2022-06-27 ENCOUNTER — Ambulatory Visit (HOSPITAL_BASED_OUTPATIENT_CLINIC_OR_DEPARTMENT_OTHER): Payer: Medicare HMO | Admitting: Anesthesiology

## 2022-06-27 ENCOUNTER — Ambulatory Visit (HOSPITAL_COMMUNITY): Payer: Medicare HMO | Admitting: Anesthesiology

## 2022-06-27 ENCOUNTER — Ambulatory Visit
Admission: RE | Admit: 2022-06-27 | Discharge: 2022-06-27 | Disposition: A | Discharge status: Home / Self Care | Payer: Medicare HMO | Source: Ambulatory Visit | Attending: Radiation Oncology | Admitting: Radiation Oncology

## 2022-06-27 ENCOUNTER — Other Ambulatory Visit: Payer: Self-pay

## 2022-06-27 ENCOUNTER — Other Ambulatory Visit: Payer: Medicare HMO

## 2022-06-27 ENCOUNTER — Ambulatory Visit (HOSPITAL_COMMUNITY)
Admission: RE | Admit: 2022-06-27 | Discharge: 2022-06-27 | Disposition: A | Discharge status: Home / Self Care | Payer: Medicare HMO | Source: Ambulatory Visit | Attending: Gastroenterology | Admitting: Gastroenterology

## 2022-06-27 ENCOUNTER — Encounter (HOSPITAL_COMMUNITY)
Admission: RE | Disposition: A | Discharge status: Home / Self Care | Payer: Self-pay | Source: Ambulatory Visit | Attending: Gastroenterology

## 2022-06-27 ENCOUNTER — Ambulatory Visit: Payer: Medicare HMO | Admitting: Hematology and Oncology

## 2022-06-27 DIAGNOSIS — F17200 Nicotine dependence, unspecified, uncomplicated: Secondary | ICD-10-CM | POA: Diagnosis not present

## 2022-06-27 DIAGNOSIS — K2289 Other specified disease of esophagus: Secondary | ICD-10-CM | POA: Insufficient documentation

## 2022-06-27 DIAGNOSIS — F17210 Nicotine dependence, cigarettes, uncomplicated: Secondary | ICD-10-CM | POA: Diagnosis not present

## 2022-06-27 DIAGNOSIS — Z79899 Other long term (current) drug therapy: Secondary | ICD-10-CM | POA: Diagnosis not present

## 2022-06-27 DIAGNOSIS — Z931 Gastrostomy status: Secondary | ICD-10-CM | POA: Insufficient documentation

## 2022-06-27 DIAGNOSIS — I4891 Unspecified atrial fibrillation: Secondary | ICD-10-CM

## 2022-06-27 DIAGNOSIS — M1990 Unspecified osteoarthritis, unspecified site: Secondary | ICD-10-CM | POA: Insufficient documentation

## 2022-06-27 DIAGNOSIS — I10 Essential (primary) hypertension: Secondary | ICD-10-CM | POA: Insufficient documentation

## 2022-06-27 DIAGNOSIS — C8599 Non-Hodgkin lymphoma, unspecified, extranodal and solid organ sites: Secondary | ICD-10-CM | POA: Diagnosis not present

## 2022-06-27 DIAGNOSIS — C8590 Non-Hodgkin lymphoma, unspecified, unspecified site: Secondary | ICD-10-CM | POA: Diagnosis not present

## 2022-06-27 DIAGNOSIS — I739 Peripheral vascular disease, unspecified: Secondary | ICD-10-CM | POA: Diagnosis not present

## 2022-06-27 DIAGNOSIS — Z510 Encounter for antineoplastic radiation therapy: Secondary | ICD-10-CM | POA: Diagnosis not present

## 2022-06-27 DIAGNOSIS — I899 Noninfective disorder of lymphatic vessels and lymph nodes, unspecified: Secondary | ICD-10-CM

## 2022-06-27 DIAGNOSIS — K449 Diaphragmatic hernia without obstruction or gangrene: Secondary | ICD-10-CM

## 2022-06-27 DIAGNOSIS — R599 Enlarged lymph nodes, unspecified: Secondary | ICD-10-CM

## 2022-06-27 DIAGNOSIS — K31819 Angiodysplasia of stomach and duodenum without bleeding: Secondary | ICD-10-CM | POA: Insufficient documentation

## 2022-06-27 DIAGNOSIS — I480 Paroxysmal atrial fibrillation: Secondary | ICD-10-CM | POA: Diagnosis not present

## 2022-06-27 DIAGNOSIS — Z7901 Long term (current) use of anticoagulants: Secondary | ICD-10-CM | POA: Diagnosis not present

## 2022-06-27 DIAGNOSIS — C321 Malignant neoplasm of supraglottis: Secondary | ICD-10-CM | POA: Diagnosis not present

## 2022-06-27 HISTORY — PX: FINE NEEDLE ASPIRATION: SHX6590

## 2022-06-27 HISTORY — PX: EUS: SHX5427

## 2022-06-27 HISTORY — PX: ESOPHAGOGASTRODUODENOSCOPY: SHX5428

## 2022-06-27 HISTORY — PX: HOT HEMOSTASIS: SHX5433

## 2022-06-27 LAB — RAD ONC ARIA SESSION SUMMARY
Course Elapsed Days: 28
Plan Fractions Treated to Date: 22
Plan Prescribed Dose Per Fraction: 2 Gy
Plan Total Fractions Prescribed: 35
Plan Total Prescribed Dose: 70 Gy
Reference Point Dosage Given to Date: 44 Gy
Reference Point Session Dosage Given: 2 Gy
Session Number: 22

## 2022-06-27 SURGERY — UPPER ENDOSCOPIC ULTRASOUND (EUS) RADIAL
Anesthesia: Monitor Anesthesia Care

## 2022-06-27 MED ORDER — PROPOFOL 10 MG/ML IV BOLUS
INTRAVENOUS | Status: DC | PRN
  Administered 2022-06-27: 30 mg via INTRAVENOUS
  Administered 2022-06-27: 10 mg via INTRAVENOUS

## 2022-06-27 MED ORDER — PROPOFOL 500 MG/50ML IV EMUL
INTRAVENOUS | Status: DC | PRN
  Administered 2022-06-27: 125 ug/kg/min via INTRAVENOUS

## 2022-06-27 MED ORDER — RIVAROXABAN 20 MG PO TABS: 20.0000 mg | ORAL_TABLET | Freq: Every day | ORAL | 1 refills | Status: DC

## 2022-06-27 MED ORDER — CIPROFLOXACIN IN D5W 400 MG/200ML IV SOLN
INTRAVENOUS | Status: AC
  Filled 2022-06-27: qty 200

## 2022-06-27 MED ORDER — LIDOCAINE 2% (20 MG/ML) 5 ML SYRINGE
INTRAMUSCULAR | Status: DC | PRN
  Administered 2022-06-27: 100 mg via INTRAVENOUS

## 2022-06-27 MED ORDER — SODIUM CHLORIDE 0.9 % IV SOLN: INTRAVENOUS | Status: DC

## 2022-06-27 MED ORDER — LACTATED RINGERS IV SOLN: INTRAVENOUS | Status: DC

## 2022-06-27 NOTE — Anesthesia Postprocedure Evaluation (Signed)
Anesthesia Post Note  Patient: Cory [name]  Procedure(s) Performed: UPPER ENDOSCOPIC ULTRASOUND (EUS) RADIAL HOT HEMOSTASIS (ARGON PLASMA COAGULATION/BICAP) ESOPHAGOGASTRODUODENOSCOPY (EGD) FINE NEEDLE ASPIRATION     Patient location during evaluation: PACU Anesthesia Type: MAC Level of consciousness: awake and alert Pain management: pain level controlled Vital Signs Assessment: post-procedure vital signs reviewed and stable Respiratory status: spontaneous breathing, nonlabored ventilation and respiratory function stable Cardiovascular status: blood pressure returned to baseline Postop Assessment: no apparent nausea or vomiting Anesthetic complications: no   No notable events documented.  Last Vitals:  Vitals:   06/27/22 1300 06/27/22 1305  BP: 126/64 132/63  Pulse: 68 64  Resp: 17 13  Temp:    SpO2: 94% 92%    Last Pain:  Vitals:   06/27/22 1305  TempSrc:   PainSc: 0-No pain                 Marthenia Rolling

## 2022-06-27 NOTE — Anesthesia Preprocedure Evaluation (Addendum)
Anesthesia Evaluation  Patient identified by MRN, date of birth, ID band Patient awake    Reviewed: Allergy & Precautions, NPO status , Patient's Chart, lab work & pertinent test results, reviewed documented beta blocker date and time   History of Anesthesia Complications Negative for: history of anesthetic complications  Airway Mallampati: II  TM Distance: >3 FB Neck ROM: Full    Dental  (+) Edentulous Lower, Edentulous Upper   Pulmonary Current Smoker and Patient abstained from smoking.   Pulmonary exam normal        Cardiovascular hypertension, Pt. on medications and Pt. on home beta blockers + Peripheral Vascular Disease  Normal cardiovascular exam+ dysrhythmias (on Xarelto) Atrial Fibrillation   TTE 05/15/22: EF 60-65%, mild LVH, grade I DD, mild AR, moderate AS, mild dilatation of aortic root measuring 19m, moderate dilatation of ascending aorta measuring 434m    Neuro/Psych negative neurological ROS     GI/Hepatic negative GI ROS, Neg liver ROS,,,  Endo/Other  negative endocrine ROS    Renal/GU negative Renal ROS     Musculoskeletal  (+) Arthritis ,    Abdominal   Peds  Hematology Plts 78k   Anesthesia Other Findings Day of surgery medications reviewed with patient.  Reproductive/Obstetrics                             Anesthesia Physical Anesthesia Plan  ASA: 3  Anesthesia Plan: MAC   Post-op Pain Management: Minimal or no pain anticipated   Induction:   PONV Risk Score and Plan: 1 and Treatment may vary due to age or medical condition and Propofol infusion  Airway Management Planned: Natural Airway and Nasal Cannula  Additional Equipment: None  Intra-op Plan:   Post-operative Plan:   Informed Consent: I have reviewed the patients History and Physical, chart, labs and discussed the procedure including the risks, benefits and alternatives for the proposed anesthesia  with the patient or authorized representative who has indicated his/her understanding and acceptance.       Plan Discussed with: CRNA  Anesthesia Plan Comments:         Anesthesia Quick Evaluation

## 2022-06-27 NOTE — Transfer of Care (Signed)
Immediate Anesthesia Transfer of Care Note  Patient: DEYLAN CANTERBURY  Procedure(s) Performed: UPPER ENDOSCOPIC ULTRASOUND (EUS) RADIAL HOT HEMOSTASIS (ARGON PLASMA COAGULATION/BICAP) ESOPHAGOGASTRODUODENOSCOPY (EGD) FINE NEEDLE ASPIRATION  Patient Location: PACU  Anesthesia Type:MAC  Level of Consciousness: awake, alert , and oriented  Airway & Oxygen Therapy: Patient Spontanous Breathing and Patient connected to face mask oxygen  Post-op Assessment: Report given to RN and Post -op Vital signs reviewed and stable  Post vital signs: Reviewed and stable  Last Vitals:  Vitals Value Taken Time  BP 101/60   Temp    Pulse 66 06/27/22 1242  Resp 20 06/27/22 1242  SpO2 99 % 06/27/22 1242  Vitals shown include unvalidated device data.  Last Pain:  Vitals:   06/27/22 0949  PainSc: 0-No pain      Patients Stated Pain Goal: 0 (79/03/83 3383)  Complications: No notable events documented.

## 2022-06-27 NOTE — H&P (Signed)
GASTROENTEROLOGY PROCEDURE H&P NOTE   Primary Care Physician: [name], MD  HPI: Cory [name] is a 76 y.o. male who presents for EGD/EUS to evaluate periportal LN that had abnormal findings on recent LN sampling EUS.  Past Medical History:  Diagnosis Date   Aortic stenosis    a. Moderate by echo 05/2020.   Arthritis    right hip   Cancer ([place])    skin cancer nose, right left, upper left basel cell   Dysrhythmia    a-fib   PAF (paroxysmal atrial fibrillation) (HCC)    Peripheral arterial disease (HCC)    Tobacco abuse    Past Surgical History:  Procedure Laterality Date   ABDOMINAL AORTIC ENDOVASCULAR STENT GRAFT N/A 08/29/2020   Procedure: ABDOMINAL AORTIC ENDOVASCULAR STENT GRAFT;  Surgeon: Elam Dutch, MD;  Location: [place];  Service: Vascular;  Laterality: N/A;   ABDOMINAL AORTOGRAM W/LOWER EXTREMITY Bilateral 06/24/2020   Procedure: ABDOMINAL AORTOGRAM W/LOWER EXTREMITY;  Surgeon: Elam Dutch, MD;  Location: [place] [facility];  Service: Cardiovascular;  Laterality: Bilateral;   BACK SURGERY     2005   BIOPSY  05/21/2022   Procedure: BIOPSY;  Surgeon: Rush Landmark Telford Nab., MD;  Location: [facility];  Service: Gastroenterology;;   CERVICAL SPINE SURGERY     DIRECT LARYNGOSCOPY Bilateral 04/18/2022   Procedure: DIRECT LARYNGOSCOPY WITH BIOPSIES;  Surgeon: Melida Quitter, MD;  Location: [place];  Service: ENT;  Laterality: Bilateral;   EMBOLECTOMY Right 08/29/2020   Procedure: POPLITEAL AND TIBIAL EMBOLECTOMY;  Surgeon: Elam Dutch, MD;  Location: [place];  Service: Vascular;  Laterality: Right;   EMBOLIZATION Right 06/24/2020   Procedure: EMBOLIZATION;  Surgeon: Elam Dutch, MD;  Location: [place] [facility];  Service: Cardiovascular;  Laterality: Right;   ENDARTERECTOMY FEMORAL Right 08/29/2020   Procedure: ENDARTERECTOMY FEMORAL;  Surgeon: Elam Dutch, MD;  Location: [facility];  Service: Vascular;  Laterality: Right;    ESOPHAGOGASTRODUODENOSCOPY (EGD) WITH PROPOFOL N/A 05/21/2022   Procedure: ESOPHAGOGASTRODUODENOSCOPY (EGD) WITH PROPOFOL;  Surgeon: Irving Copas., MD;  Location: [facility];  Service: Gastroenterology;  Laterality: N/A;   EUS N/A 05/21/2022   Procedure: UPPER ENDOSCOPIC ULTRASOUND (EUS) RADIAL;  Surgeon: Irving Copas., MD;  Location: [facility];  Service: Gastroenterology;  Laterality: N/A;   EYE SURGERY     FEMORAL ARTERY EXPLORATION Right 06/25/2020   Procedure: RIGHT GROIN EXPLORATION Evacuation of Hematoma  WITH REPAIR OF RIGHT Common FEMORAL ARTERY.;  Surgeon: Angelia Mould, MD;  Location: [place];  Service: Vascular;  Laterality: Right;   FEMORAL-POPLITEAL BYPASS GRAFT Left 11/22/2020   Procedure: LEFT ABOVE KNEE-BELOW KNEE BYPASS TIBIAL PERITONEAL TRUNK REVERSE IPSILATERAL GREATER SAPHENOUS VEIN;  Surgeon: Elam Dutch, MD;  Location: [facility];  Service: Vascular;  Laterality: Left;   FINE NEEDLE ASPIRATION N/A 05/21/2022   Procedure: FINE NEEDLE ASPIRATION (FNA) LINEAR;  Surgeon: Irving Copas., MD;  Location: [facility];  Service: Gastroenterology;  Laterality: N/A;   IR GASTROSTOMY TUBE MOD SED  06/12/2022   IR IMAGING GUIDED PORT INSERTION  06/12/2022   LEG SURGERY     Trauma   LOWER EXTREMITY ANGIOGRAPHY Left 11/17/2020   Procedure: Lower Extremity Angiography;  Surgeon: Marty Heck, MD;  Location: [place] [facility];  Service: Cardiovascular;  Laterality: Left;   PATCH ANGIOPLASTY Right 08/29/2020   Procedure: PATCH ANGIOPLASTY RIGHT BELOW KNEE POPLITEAL ARTERY AND RIGHT COMMON FEMORAL ARTERY;  Surgeon: Elam Dutch, MD;  Location: [place];  Service: Vascular;  Laterality: Right;   RETINAL DETACHMENT SURGERY     RIGID ESOPHAGOSCOPY  04/18/2022   Procedure: RIGID ESOPHAGOSCOPY;  Surgeon: Melida Quitter, MD;  Location: [place];  Service: ENT;;   TONSILLECTOMY  04/18/2022   Procedure: LEFT TONSILLECTOMY;  Surgeon: Melida Quitter, MD;   Location: [place];  Service: ENT;;   ULTRASOUND GUIDANCE FOR VASCULAR ACCESS Bilateral 08/29/2020   Procedure: ULTRASOUND GUIDANCE FOR VASCULAR ACCESS;  Surgeon: Elam Dutch, MD;  Location: [place];  Service: Vascular;  Laterality: Bilateral;   Current Facility-Administered Medications  Medication Dose Route Frequency Provider Last Rate Last Admin   0.9 %  sodium chloride infusion   Intravenous Continuous Mansouraty, Telford Nab., MD       lactated ringers infusion   Intravenous Continuous Mansouraty, Telford Nab., MD 10 mL/hr at 06/27/22 1150 Restarted at 06/27/22 1236   Current Outpatient Medications  Medication Sig Dispense Refill   acetaminophen (TYLENOL) 500 MG tablet Take 500 mg by mouth every 6 (six) hours as needed (for pain.).     cetirizine (ZYRTEC) 10 MG tablet Take 1 tablet (10 mg total) by mouth daily. (Patient taking differently: Take 10 mg by mouth daily as needed for allergies.) 30 tablet 11   cholecalciferol (VITAMIN D) 25 MCG (1000 UNIT) tablet Take 1,000 Units by mouth 2 (two) times daily.     dexamethasone (DECADRON) 4 MG tablet Take 2 tablets daily x 3 days starting the day after cisplatin chemotherapy. Take with food. 30 tablet 1   diltiazem (TIADYLT ER) 120 MG 24 hr capsule TAKE 1 CAPSULE EVERY DAY (SCHEDULE OFFICE VISIT FOR FUTURE REFILLS) 90 capsule 1   lidocaine (XYLOCAINE) 2 % solution Mix 1 part lidocaine with 1 part water, then swish and swallow 10 ml of mixture 30 minutes before meals and at bedtime up to 4 times daily as needed for sore throat 200 mL 0   lidocaine-prilocaine (EMLA) cream Apply to affected area once (Patient taking differently: Apply 1 Application topically as needed (before chemo). Apply to affected area once) 30 g 3   nicotine (NICODERM CQ - DOSED IN MG/24 HOURS) 21 mg/24hr patch Place 1 patch (21 mg total) onto the skin daily. Apply 21 mg patch daily x 6 wk, then '14mg'$  patch daily x 2 wk, then 7 mg patch daily x 2 wk 14 patch 2   omeprazole  (PRILOSEC) 40 MG capsule Take 1 capsule (40 mg total) by mouth daily. 30 capsule 6   rosuvastatin (CRESTOR) 10 MG tablet Take 1 tablet (10 mg total) by mouth daily. 90 tablet 1   sodium chloride (OCEAN) 0.65 % SOLN nasal spray Place 1 spray into both nostrils as needed for congestion.     lidocaine (XYLOCAINE) 2 % solution Patient: Mix 1part 2% viscous lidocaine, 1part H20. Swish & swallow 5m of diluted mixture, 312m before meals  and at bedtime, up to QID prn sore throat. (Patient not taking: Reported on 06/22/2022) 200 mL 3   metoprolol tartrate (LOPRESSOR) 25 MG tablet Take 1 tablet (25 mg total) by mouth 2 (two) times daily as needed. 30 tablet 1   ondansetron (ZOFRAN) 8 MG tablet Take 1 tablet (8 mg total) by mouth every 8 (eight) hours as needed for nausea or vomiting. Start on the third day after cisplatin. 30 tablet 1   prochlorperazine (COMPAZINE) 10 MG tablet Take 1 tablet (10 mg total) by mouth every 6 (six) hours as needed (Nausea or vomiting). 30 tablet 1   [START ON  06/29/2022] rivaroxaban (XARELTO) 20 MG TABS tablet Take 1 tablet (20 mg total) by mouth daily with supper. 90 tablet 1    Current Facility-Administered Medications:    0.9 %  sodium chloride infusion, , Intravenous, Continuous, Mansouraty, Telford Nab., MD   lactated ringers infusion, , Intravenous, Continuous, Mansouraty, Telford Nab., MD, Last Rate: 10 mL/hr at 06/27/22 1150, Restarted at 06/27/22 1236  Current Outpatient Medications:    acetaminophen (TYLENOL) 500 MG tablet, Take 500 mg by mouth every 6 (six) hours as needed (for pain.)., Disp: , Rfl:    cetirizine (ZYRTEC) 10 MG tablet, Take 1 tablet (10 mg total) by mouth daily. (Patient taking differently: Take 10 mg by mouth daily as needed for allergies.), Disp: 30 tablet, Rfl: 11   cholecalciferol (VITAMIN D) 25 MCG (1000 UNIT) tablet, Take 1,000 Units by mouth 2 (two) times daily., Disp: , Rfl:    dexamethasone (DECADRON) 4 MG tablet, Take 2 tablets daily x 3  days starting the day after cisplatin chemotherapy. Take with food., Disp: 30 tablet, Rfl: 1   diltiazem (TIADYLT ER) 120 MG 24 hr capsule, TAKE 1 CAPSULE EVERY DAY (SCHEDULE OFFICE VISIT FOR FUTURE REFILLS), Disp: 90 capsule, Rfl: 1   lidocaine (XYLOCAINE) 2 % solution, Mix 1 part lidocaine with 1 part water, then swish and swallow 10 ml of mixture 30 minutes before meals and at bedtime up to 4 times daily as needed for sore throat, Disp: 200 mL, Rfl: 0   lidocaine-prilocaine (EMLA) cream, Apply to affected area once (Patient taking differently: Apply 1 Application topically as needed (before chemo). Apply to affected area once), Disp: 30 g, Rfl: 3   nicotine (NICODERM CQ - DOSED IN MG/24 HOURS) 21 mg/24hr patch, Place 1 patch (21 mg total) onto the skin daily. Apply 21 mg patch daily x 6 wk, then '14mg'$  patch daily x 2 wk, then 7 mg patch daily x 2 wk, Disp: 14 patch, Rfl: 2   omeprazole (PRILOSEC) 40 MG capsule, Take 1 capsule (40 mg total) by mouth daily., Disp: 30 capsule, Rfl: 6   rosuvastatin (CRESTOR) 10 MG tablet, Take 1 tablet (10 mg total) by mouth daily., Disp: 90 tablet, Rfl: 1   sodium chloride (OCEAN) 0.65 % SOLN nasal spray, Place 1 spray into both nostrils as needed for congestion., Disp: , Rfl:    lidocaine (XYLOCAINE) 2 % solution, Patient: Mix 1part 2% viscous lidocaine, 1part H20. Swish & swallow 22m of diluted mixture, 354m before meals  and at bedtime, up to QID prn sore throat. (Patient not taking: Reported on 06/22/2022), Disp: 200 mL, Rfl: 3   metoprolol tartrate (LOPRESSOR) 25 MG tablet, Take 1 tablet (25 mg total) by mouth 2 (two) times daily as needed., Disp: 30 tablet, Rfl: 1   ondansetron (ZOFRAN) 8 MG tablet, Take 1 tablet (8 mg total) by mouth every 8 (eight) hours as needed for nausea or vomiting. Start on the third day after cisplatin., Disp: 30 tablet, Rfl: 1   prochlorperazine (COMPAZINE) 10 MG tablet, Take 1 tablet (10 mg total) by mouth every 6 (six) hours as needed  (Nausea or vomiting)., Disp: 30 tablet, Rfl: 1   [START ON 06/29/2022] rivaroxaban (XARELTO) 20 MG TABS tablet, Take 1 tablet (20 mg total) by mouth daily with supper., Disp: 90 tablet, Rfl: 1 Allergies  Allergen Reactions   Zithromax [Azithromycin] Diarrhea   Family History  Problem Relation Age of Onset   Ovarian cancer Mother    Alcoholism Father    Breast  cancer Sister    Ovarian cancer Sister    Social History   Socioeconomic History   Marital status: Married    Spouse name: Not on file   Number of children: 5   Years of education: Not on file   Highest education level: Master's degree (e.g., MA, MS, MEng, MEd, MSW, MBA)  Occupational History   Not on file  Tobacco Use   Smoking status: Every Day    Packs/day: 0.50    Years: 50.00    Total pack years: 25.00    Types: Cigarettes    Passive exposure: Never   Smokeless tobacco: Never  Vaping Use   Vaping Use: Never used  Substance and Sexual Activity   Alcohol use: Yes    Alcohol/week: 0.0 standard drinks of alcohol    Comment: 4 to 5 times a week; beer, wine and liquor    Drug use: No   Sexual activity: Not Currently  Other Topics Concern   Not on file  Social History Narrative   Scores reading tests.  Lives with wife.     Social Determinants of Health   Financial Resource Strain: Low Risk  (05/24/2022)   Overall Financial Resource Strain (CARDIA)    Difficulty of Paying Living Expenses: Not hard at all  Food Insecurity: No Food Insecurity (05/24/2022)   Hunger Vital Sign    Worried About Running Out of Food in the Last Year: Never true    Ran Out of Food in the Last Year: Never true  Transportation Needs: No Transportation Needs (05/24/2022)   PRAPARE - Hydrologist (Medical): No    Lack of Transportation (Non-Medical): No  Physical Activity: Insufficiently Active (01/08/2022)   Exercise Vital Sign    Days of Exercise per Week: 3 days    Minutes of Exercise per Session: 30 min   Stress: No Stress Concern Present (01/08/2022)   [place]    Feeling of Stress : Not at all  Social Connections: Moderately Isolated (01/08/2022)   Social Connection and Isolation Panel [NHANES]    Frequency of Communication with Friends and Family: More than three times a week    Frequency of Social Gatherings with Friends and Family: Three times a week    Attends Religious Services: Never    Active Member of Clubs or Organizations: No    Attends Archivist Meetings: Not on file    Marital Status: Married  Human resources officer Violence: Not At Risk (06/07/2021)   Humiliation, Afraid, Rape, and Kick questionnaire    Fear of Current or Ex-Partner: No    Emotionally Abused: No    Physically Abused: No    Sexually Abused: No    Physical Exam: Today's Vitals   06/27/22 1243 06/27/22 1250 06/27/22 1300 06/27/22 1305  BP: 101/60 111/63 126/64 132/63  Pulse: 66 78 68 64  Resp: '20 15 17 13  '$ Temp: 97.9 F (36.6 C)     TempSrc: Temporal     SpO2: 99% 94% 94% 92%  Weight:      Height:      PainSc: 0-No pain 0-No pain 0-No pain 0-No pain   Body mass index is 26.78 kg/m. GEN: NAD EYE: Sclerae anicteric ENT: MMM CV: Non-tachycardic GI: Soft, NT/ND NEURO:  Alert & Oriented x 3  Lab Results: Recent Labs    06/26/22 1004  WBC 4.2  HGB 13.3  HCT 38.0*  PLT 78*   BMET  Recent Labs    06/26/22 1004  NA 133*  K 3.7  CL 98  CO2 32  GLUCOSE 101*  BUN 21  CREATININE 0.77  CALCIUM 8.6*   LFT Recent Labs    06/26/22 1004  PROT 5.5*  ALBUMIN 3.4*  AST 17  ALT 30  ALKPHOS 36*  BILITOT 0.6   PT/INR No results for input(s): "LABPROT", "INR" in the last 72 hours.   Impression / Plan: This is a 76 y.o.male who presents for EGD/EUS to evaluate periportal LN that had abnormal findings on recent LN sampling EUS.  The risks of an EUS including intestinal perforation, bleeding, infection, aspiration,  and medication effects were discussed as was the possibility it may not give a definitive diagnosis if a biopsy is performed.  When a biopsy of the pancreas is done as part of the EUS, there is an additional risk of pancreatitis at the rate of about 1-2%.  It was explained that procedure related pancreatitis is typically mild, although it can be severe and even life threatening, which is why we do not perform random pancreatic biopsies and only biopsy a lesion/area we feel is concerning enough to warrant the risk.  The risks and benefits of endoscopic evaluation/treatment were discussed with the patient and/or family; these include but are not limited to the risk of perforation, infection, bleeding, missed lesions, lack of diagnosis, severe illness requiring hospitalization, as well as anesthesia and sedation related illnesses.  The patient's history has been reviewed, patient examined, no change in status, and deemed stable for procedure.  The patient and/or family is agreeable to proceed.    Justice Britain, MD [facility] Office # [phone]

## 2022-06-27 NOTE — Op Note (Signed)
[facility] Patient Name: Cory [name] Procedure Date: 06/27/2022 MRN: [id number] Attending MD: Justice Britain , MD, [id number] Date of Birth: 10-12-1945 CSN: [id number] Age: 76 Admit Type: Outpatient Procedure:                Upper EUS Indications:              Lymphoma Providers:                Justice Britain, MD, Dulcy Fanny, Brien Mates, Technician Referring MD:             Lawson Radar Medicines:                Monitored Anesthesia Care Complications:            No immediate complications. Estimated Blood Loss:     Estimated blood loss was minimal. Procedure:                Pre-Anesthesia Assessment:                           - Prior to the procedure, a History and Physical                            was performed, and patient medications and                            allergies were reviewed. The patient's tolerance of                            previous anesthesia was also reviewed. The risks                            and benefits of the procedure and the sedation                            options and risks were discussed with the patient.                            All questions were answered, and informed consent                            was obtained. Prior Anticoagulants: The patient has                            taken Xarelto (rivaroxaban), last dose was 3 days                            prior to procedure. ASA Grade Assessment: III - A                            patient with severe systemic disease. After  reviewing the risks and benefits, the patient was                            deemed in satisfactory condition to undergo the                            procedure.                           After obtaining informed consent, the endoscope was                            passed under direct vision. Throughout the                            procedure, the  patient's blood pressure, pulse, and                            oxygen saturations were monitored continuously. The                            [id number]-[id number] ([id number]) Olympus endoscope was introduced                            through the mouth, and advanced to the second part                            of duodenum. The [id number] ([id number]) Olympus linear                            ultrasound scope was introduced through the mouth,                            and advanced to the duodenum for ultrasound                            examination. The upper EUS was accomplished without                            difficulty. The patient tolerated the procedure. Scope In: Scope Out: Findings:      ENDOSCOPIC FINDING: :      No gross lesions were noted in the entire esophagus.      The Z-line was irregular and was found 42 cm from the incisors.      A 1 cm hiatal hernia was present.      There was evidence of a patent gastrostomy with no G-tube present on the       anterior wall of the stomach. This was characterized by healthy       appearing mucosa.      No other gross lesions were noted in the entire examined stomach.      A single small angiodysplastic lesion with typical arborization was       found in the duodenal bulb. Fulguration to ablate the lesion to prevent       bleeding by argon plasma was successful.  No other gross lesions were noted in the duodenal bulb, in the first       portion of the duodenum and in the second portion of the duodenum.      ENDOSONOGRAPHIC FINDING: :      One enlarged lymph node was visualized in the porta hepatis region. It       measured 13 mm by 9 mm in maximal cross-sectional diameter. The node was       round, hypoechoic and had well defined margins. Fine needle biopsy was       performed. Color Doppler imaging was utilized prior to needle puncture       to confirm a lack of significant vascular structures within the needle       path. Seven passes were  made with the 22 gauge Acquire biopsy needle       using a transduodenal approach. A visible core of tissue was obtained.       Preliminary cytologic examination and touch preps were performed. The       cellularity of the specimen was adequate. Impression:               EGD impression:                           - No gross lesions in the entire esophagus. Z-line                            irregular, 42 cm from the incisors.                           - 1 cm hiatal hernia.                           - Patent gastrostomy with no G-tube present                            characterized by healthy appearing mucosa.                           - No other gross lesions in the entire stomach.                           - A single angiodysplastic lesion in the duodenum.                            Treated with argon plasma coagulation (APC).                           - No other gross lesions in the duodenal bulb, in                            the first portion of the duodenum and in the second                            portion of the duodenum.  EUS impression:                           - One malignant-appearing lymph node was visualized                            in the porta hepatis region. Fine needle biopsy                            performed. Moderate Sedation:      Not Applicable - Patient had care per Anesthesia. Recommendation:           - The patient will be observed post-procedure,                            until all discharge criteria are met.                           - Discharge patient to home.                           - Patient has a contact number available for                            emergencies. The signs and symptoms of potential                            delayed complications were discussed with the                            patient. Return to normal activities tomorrow.                            Written discharge instructions were provided to the                             patient.                           - Resume previous diet.                           - Observe patient's clinical course.                           - Await cytology results.                           - Restart Xarelto in 2-days.                           - Continue other medications otherwise.                           - The findings and recommendations were discussed  with the patient.                           - The findings and recommendations were discussed                            with the patient's family. Procedure Code(s):        --- Professional ---                           [phone], Esophagogastroduodenoscopy, flexible,                            transoral; with transendoscopic ultrasound-guided                            intramural or transmural fine needle                            aspiration/biopsy(s), (includes endoscopic                            ultrasound examination limited to the esophagus,                            stomach or duodenum, and adjacent structures)                           [id number], 59, Esophagogastroduodenoscopy, flexible,                            transoral; with control of bleeding, any method Diagnosis Code(s):        --- Professional ---                           K22.89, Other specified disease of esophagus                           K44.9, Diaphragmatic hernia without obstruction or                            gangrene                           Z93.1, Gastrostomy status                           K31.819, Angiodysplasia of stomach and duodenum                            without bleeding                           I89.9, Noninfective disorder of lymphatic vessels                            and lymph nodes, unspecified  C85.99, Non-Hodgkin lymphoma, unspecified,                            extranodal and solid organ sites CPT copyright 2022 [employer]. All  rights reserved. The codes documented in this report are preliminary and upon coder review may  be revised to meet current compliance requirements. Justice Britain, MD 06/27/2022 12:45:28 PM Number of Addenda: 0

## 2022-06-27 NOTE — Discharge Instructions (Signed)
YOU HAD AN ENDOSCOPIC PROCEDURE TODAY: Refer to the procedure report and other information in the discharge instructions given to you for any specific questions about what was found during the examination. If this information does not answer your questions, please call [facility] office at [phone] to clarify.   YOU SHOULD EXPECT: Some feelings of bloating in the abdomen. Passage of more gas than usual. Walking can help get rid of the air that was put into your GI tract during the procedure and reduce the bloating. If you had a lower endoscopy (such as a colonoscopy or flexible sigmoidoscopy) you may notice spotting of blood in your stool or on the toilet paper. Some abdominal soreness may be present for a day or two, also.  DIET: Your first meal following the procedure should be a light meal and then it is ok to progress to your normal diet. A half-sandwich or bowl of soup is an example of a good first meal. Heavy or fried foods are harder to digest and may make you feel nauseous or bloated. Drink plenty of fluids but you should avoid alcoholic beverages for 24 hours. If you had a esophageal dilation, please see attached instructions for diet.    ACTIVITY: Your care partner should take you home directly after the procedure. You should plan to take it easy, moving slowly for the rest of the day. You can resume normal activity the day after the procedure however YOU SHOULD NOT DRIVE, use power tools, machinery or perform tasks that involve climbing or major physical exertion for 24 hours (because of the sedation medicines used during the test).   SYMPTOMS TO REPORT IMMEDIATELY: A gastroenterologist can be reached at any hour. Please call [phone]  for any of the following symptoms:   Following upper endoscopy (EGD, EUS, ERCP, esophageal dilation) Vomiting of blood or coffee ground material  New, significant abdominal pain  New, significant chest pain or pain under the shoulder blades  Painful or  persistently difficult swallowing  New shortness of breath  Black, tarry-looking or red, bloody stools  FOLLOW UP:  If any biopsies were taken you will be contacted by phone or by letter within the next 1-3 weeks. Call [phone]  if you have not heard about the biopsies in 3 weeks.  Please also call with any specific questions about appointments or follow up tests.  

## 2022-06-29 ENCOUNTER — Ambulatory Visit: Payer: Medicare HMO

## 2022-06-29 ENCOUNTER — Encounter (HOSPITAL_COMMUNITY): Payer: Self-pay | Admitting: Gastroenterology

## 2022-06-29 LAB — CYTOLOGY - NON PAP

## 2022-06-30 ENCOUNTER — Encounter: Payer: Self-pay | Admitting: Gastroenterology

## 2022-07-02 ENCOUNTER — Other Ambulatory Visit: Payer: Self-pay

## 2022-07-02 ENCOUNTER — Ambulatory Visit
Admission: RE | Admit: 2022-07-02 | Discharge: 2022-07-02 | Disposition: A | Discharge status: Home / Self Care | Payer: Medicare HMO | Source: Ambulatory Visit | Attending: Radiation Oncology | Admitting: Radiation Oncology

## 2022-07-02 DIAGNOSIS — C321 Malignant neoplasm of supraglottis: Secondary | ICD-10-CM | POA: Diagnosis not present

## 2022-07-02 DIAGNOSIS — Z510 Encounter for antineoplastic radiation therapy: Secondary | ICD-10-CM | POA: Diagnosis not present

## 2022-07-02 LAB — RAD ONC ARIA SESSION SUMMARY
Course Elapsed Days: 33
Plan Fractions Treated to Date: 23
Plan Prescribed Dose Per Fraction: 2 Gy
Plan Total Fractions Prescribed: 35
Plan Total Prescribed Dose: 70 Gy
Reference Point Dosage Given to Date: 46 Gy
Reference Point Session Dosage Given: 2 Gy
Session Number: 23

## 2022-07-03 ENCOUNTER — Encounter: Payer: Self-pay | Admitting: Hematology and Oncology

## 2022-07-03 ENCOUNTER — Inpatient Hospital Stay: Payer: Medicare HMO

## 2022-07-03 ENCOUNTER — Telehealth: Payer: Self-pay | Admitting: *Deleted

## 2022-07-03 ENCOUNTER — Other Ambulatory Visit: Payer: Self-pay

## 2022-07-03 ENCOUNTER — Inpatient Hospital Stay (HOSPITAL_BASED_OUTPATIENT_CLINIC_OR_DEPARTMENT_OTHER): Payer: Medicare HMO | Admitting: Hematology and Oncology

## 2022-07-03 ENCOUNTER — Other Ambulatory Visit: Payer: Self-pay | Admitting: *Deleted

## 2022-07-03 ENCOUNTER — Ambulatory Visit
Admission: RE | Admit: 2022-07-03 | Discharge: 2022-07-03 | Disposition: A | Discharge status: Home / Self Care | Payer: Medicare HMO | Source: Ambulatory Visit | Attending: Radiation Oncology | Admitting: Radiation Oncology

## 2022-07-03 DIAGNOSIS — C321 Malignant neoplasm of supraglottis: Secondary | ICD-10-CM

## 2022-07-03 DIAGNOSIS — Z95828 Presence of other vascular implants and grafts: Secondary | ICD-10-CM

## 2022-07-03 DIAGNOSIS — Z510 Encounter for antineoplastic radiation therapy: Secondary | ICD-10-CM | POA: Diagnosis not present

## 2022-07-03 LAB — CBC WITH DIFFERENTIAL (CANCER CENTER ONLY)
Abs Immature Granulocytes: 0 10*3/uL (ref 0.00–0.07)
Basophils Absolute: 0 10*3/uL (ref 0.0–0.1)
Basophils Relative: 1 %
Eosinophils Absolute: 0 10*3/uL (ref 0.0–0.5)
Eosinophils Relative: 1 %
HCT: 36.5 % — ABNORMAL LOW (ref 39.0–52.0)
Hemoglobin: 12.7 g/dL — ABNORMAL LOW (ref 13.0–17.0)
Immature Granulocytes: 0 %
Lymphocytes Relative: 19 %
Lymphs Abs: 0.3 10*3/uL — ABNORMAL LOW (ref 0.7–4.0)
MCH: 33.2 pg (ref 26.0–34.0)
MCHC: 34.8 g/dL (ref 30.0–36.0)
MCV: 95.3 fL (ref 80.0–100.0)
Monocytes Absolute: 0.1 10*3/uL (ref 0.1–1.0)
Monocytes Relative: 9 %
Neutro Abs: 1 10*3/uL — ABNORMAL LOW (ref 1.7–7.7)
Neutrophils Relative %: 70 %
Platelet Count: 67 10*3/uL — ABNORMAL LOW (ref 150–400)
RBC: 3.83 MIL/uL — ABNORMAL LOW (ref 4.22–5.81)
RDW: 13.3 % (ref 11.5–15.5)
WBC Count: 1.4 10*3/uL — ABNORMAL LOW (ref 4.0–10.5)
nRBC: 0 % (ref 0.0–0.2)

## 2022-07-03 LAB — RAD ONC ARIA SESSION SUMMARY
Course Elapsed Days: 34
Plan Fractions Treated to Date: 24
Plan Prescribed Dose Per Fraction: 2 Gy
Plan Total Fractions Prescribed: 35
Plan Total Prescribed Dose: 70 Gy
Reference Point Dosage Given to Date: 48 Gy
Reference Point Session Dosage Given: 2 Gy
Session Number: 24

## 2022-07-03 LAB — BASIC METABOLIC PANEL - CANCER CENTER ONLY
Anion gap: 5 (ref 5–15)
BUN: 13 mg/dL (ref 8–23)
CO2: 32 mmol/L (ref 22–32)
Calcium: 9.6 mg/dL (ref 8.9–10.3)
Chloride: 100 mmol/L (ref 98–111)
Creatinine: 0.84 mg/dL (ref 0.61–1.24)
GFR, Estimated: >60 mL/min (ref >60)
Glucose, Bld: 99 mg/dL (ref 70–99)
Potassium: 3.6 mmol/L (ref 3.5–5.1)
Sodium: 137 mmol/L (ref 135–145)

## 2022-07-03 LAB — MAGNESIUM: Magnesium: 1.9 mg/dL (ref 1.7–2.4)

## 2022-07-03 MED ORDER — SODIUM CHLORIDE 0.9 % IV SOLN: Freq: Once | INTRAVENOUS | Status: DC

## 2022-07-03 MED ORDER — HEPARIN SOD (PORK) LOCK FLUSH 100 UNIT/ML IV SOLN
500.0000 [IU] | Freq: Once | INTRAVENOUS | Status: AC
  Administered 2022-07-03: 500 [IU]

## 2022-07-03 MED ORDER — SODIUM CHLORIDE 0.9% FLUSH
10.0000 mL | Freq: Once | INTRAVENOUS | Status: AC
  Administered 2022-07-03: 10 mL

## 2022-07-03 MED FILL — Dexamethasone Sodium Phosphate Inj 100 MG/10ML: INTRAMUSCULAR | Qty: 1 | Status: AC

## 2022-07-03 MED FILL — Fosaprepitant Dimeglumine For IV Infusion 150 MG (Base Eq): INTRAVENOUS | Qty: 5 | Status: AC

## 2022-07-03 NOTE — Progress Notes (Signed)
[place] NOTE  Patient Care Team: [name], MD as PCP - General [name], MD as PCP - Cardiology (Cardiology) Viona Gilmore, [facility] as Pharmacist (Pharmacist) Malmfelt, Stephani Police, RN as Oncology Nurse Navigator [name], MD as Consulting Physician (Radiation Oncology) Benay Pike, MD as Consulting Physician (Hematology and Oncology)  CHIEF COMPLAINTS/PURPOSE OF CONSULTATION:  SCC supraglottis  ONCOLOGIC HISTORY: Initially presented to ENT with chronic left-sided sore throat for almost a year.  Initial evaluation did not show any possible causes for sore throat.  He was recommended PPI twice daily but went back to ENT with persistent sore throat he once again had a repeat fiberoptic exam which was stable.   CT neck done on November 08, 2021 did show subcentimeter focus of mucosal hyperenhancement along the posterior aspect of the uvula  laryngoscopy with direct visualization recommended to exclude a mucosal lesion at the site.  Additionally few tiny cystic-appearing foci along the left glossotonsillar sulcus.  Direct visualization recommended.  Nonspecific mildly enlarged right level 2 lymph node.  Multiple small calcific foci in the left palatine tonsil.Repeat fiberoptic exam was unremarkable hence Dr. 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  Tongue biopsy showed benign squamous mucosa with low to intermediate grade dysplasia. 05/07/2022: PET/CT showed asymmetric hypermetabolism in the left oropharynx and involving the left epiglottis.  Contralateral hypermetabolic level 2 cervical lymph nodes compatible with metastatic disease.  No hypermetabolic adenopathy in the left neck or in the chest.  1.2 cm short axis aortocaval lymph node in the upper abdomen is  hypermetabolic metastatic disease is a concern.  This lymph node has increased from 0.7 cm on the study from 2022.  No other metastatic disease. Started radiation on 05/30/2022 and weekly cisplatin on 06/01/2022.   INTERIM HISTORY:  Cory [name] returns for a toxicity check before his Cycle 6 weekly cisplatin infusion scheduled for tomorrow.  Patient is here for follow-up by himself.  Since last visit, he complains of a little bit of fatigue, lack of appetite because of thick mucus and some weight loss.  He has lost almost 10 pounds.  He has been only putting 1 carton in his G-tube.  He tells me that he does not really have much sore throat to swallow although it is a bit hard to swallow because of the thick saliva.  He recently had another biopsy of the aortocaval lymph node which was inconclusive. He denies any tingling or numbness in his hands or foot.  He notices some fullness in bilateral ears but no definite change in hearing.  No other toxicity reported from the chemotherapy. Rest of the pertinent 10 point ROS reviewed and negative  MEDICAL HISTORY:  Past Medical History:  Diagnosis Date   Aortic stenosis    a. Moderate by echo 05/2020.   Arthritis    right hip   Cancer ([place])    skin cancer nose, right left, upper left basel cell   Dysrhythmia    a-fib   PAF (paroxysmal atrial fibrillation) (HCC)    Peripheral arterial disease (HCC)    Tobacco abuse     SURGICAL HISTORY: Past Surgical History:  Procedure Laterality Date   ABDOMINAL AORTIC ENDOVASCULAR STENT GRAFT N/A 08/29/2020   Procedure: ABDOMINAL AORTIC ENDOVASCULAR STENT GRAFT;  Surgeon: Elam Dutch, MD;  Location: [place];  Service: Vascular;  Laterality:  N/A;   ABDOMINAL AORTOGRAM W/LOWER EXTREMITY Bilateral 06/24/2020   Procedure: ABDOMINAL AORTOGRAM W/LOWER EXTREMITY;  Surgeon: Elam Dutch, MD;  Location: [place] [facility];  Service: Cardiovascular;  Laterality: Bilateral;   BACK SURGERY     2005   BIOPSY   05/21/2022   Procedure: BIOPSY;  Surgeon: Rush Landmark Telford Nab., MD;  Location: [facility];  Service: Gastroenterology;;   CERVICAL SPINE SURGERY     DIRECT LARYNGOSCOPY Bilateral 04/18/2022   Procedure: DIRECT LARYNGOSCOPY WITH BIOPSIES;  Surgeon: Melida Quitter, MD;  Location: [place];  Service: ENT;  Laterality: Bilateral;   EMBOLECTOMY Right 08/29/2020   Procedure: POPLITEAL AND TIBIAL EMBOLECTOMY;  Surgeon: Elam Dutch, MD;  Location: [place];  Service: Vascular;  Laterality: Right;   EMBOLIZATION Right 06/24/2020   Procedure: EMBOLIZATION;  Surgeon: Elam Dutch, MD;  Location: [place] [facility];  Service: Cardiovascular;  Laterality: Right;   ENDARTERECTOMY FEMORAL Right 08/29/2020   Procedure: ENDARTERECTOMY FEMORAL;  Surgeon: Elam Dutch, MD;  Location: [facility];  Service: Vascular;  Laterality: Right;   ESOPHAGOGASTRODUODENOSCOPY N/A 06/27/2022   Procedure: ESOPHAGOGASTRODUODENOSCOPY (EGD);  Surgeon: Irving Copas., MD;  Location: [facility];  Service: Gastroenterology;  Laterality: N/A;   ESOPHAGOGASTRODUODENOSCOPY (EGD) WITH PROPOFOL N/A 05/21/2022   Procedure: ESOPHAGOGASTRODUODENOSCOPY (EGD) WITH PROPOFOL;  Surgeon: Rush Landmark Telford Nab., MD;  Location: [facility];  Service: Gastroenterology;  Laterality: N/A;   EUS N/A 05/21/2022   Procedure: UPPER ENDOSCOPIC ULTRASOUND (EUS) RADIAL;  Surgeon: Irving Copas., MD;  Location: [facility];  Service: Gastroenterology;  Laterality: N/A;   EUS N/A 06/27/2022   Procedure: UPPER ENDOSCOPIC ULTRASOUND (EUS) RADIAL;  Surgeon: Irving Copas., MD;  Location: [facility];  Service: Gastroenterology;  Laterality: N/A;   EYE SURGERY     FEMORAL ARTERY EXPLORATION Right 06/25/2020   Procedure: RIGHT GROIN EXPLORATION Evacuation of Hematoma  WITH REPAIR OF RIGHT Common FEMORAL ARTERY.;  Surgeon: Angelia Mould, MD;  Location: [place];  Service: Vascular;  Laterality: Right;   FEMORAL-POPLITEAL  BYPASS GRAFT Left 11/22/2020   Procedure: LEFT ABOVE KNEE-BELOW KNEE BYPASS TIBIAL PERITONEAL TRUNK REVERSE IPSILATERAL GREATER SAPHENOUS VEIN;  Surgeon: Elam Dutch, MD;  Location[facility];  Service: Vascular;  Laterality: Left;   FINE NEEDLE ASPIRATION N/A 05/21/2022   Procedure: FINE NEEDLE ASPIRATION (FNA) LINEAR;  Surgeon: Irving Copas., MD;  Location: [facility];  Service: Gastroenterology;  Laterality: N/A;   FINE NEEDLE ASPIRATION  06/27/2022   Procedure: FINE NEEDLE ASPIRATION;  Surgeon: Rush Landmark Telford Nab., MD;  Location: [facility];  Service: Gastroenterology;;   HOT HEMOSTASIS N/A 06/27/2022   Procedure: HOT HEMOSTASIS (ARGON PLASMA COAGULATION/BICAP);  Surgeon: Irving Copas., MD;  Location: [facility];  Service: Gastroenterology;  Laterality: N/A;   IR GASTROSTOMY TUBE MOD SED  06/12/2022   IR IMAGING GUIDED PORT INSERTION  06/12/2022   LEG SURGERY     Trauma   LOWER EXTREMITY ANGIOGRAPHY Left 11/17/2020   Procedure: Lower Extremity Angiography;  Surgeon: Marty Heck, MD;  Location: [place] [facility];  Service: Cardiovascular;  Laterality: Left;   PATCH ANGIOPLASTY Right 08/29/2020   Procedure: PATCH ANGIOPLASTY RIGHT BELOW KNEE POPLITEAL ARTERY AND RIGHT COMMON FEMORAL ARTERY;  Surgeon: Elam Dutch, MD;  Location: [place];  Service: Vascular;  Laterality: Right;   RETINAL DETACHMENT SURGERY     RIGID ESOPHAGOSCOPY  04/18/2022   Procedure: RIGID ESOPHAGOSCOPY;  Surgeon: Melida Quitter, MD;  Location: [place];  Service: ENT;;   TONSILLECTOMY  04/18/2022   Procedure: LEFT TONSILLECTOMY;  Surgeon: Melida Quitter, MD;  Location: [facility] OR;  Service: ENT;;   ULTRASOUND GUIDANCE FOR VASCULAR ACCESS Bilateral 08/29/2020   Procedure: ULTRASOUND GUIDANCE FOR VASCULAR ACCESS;  Surgeon: Elam Dutch, MD;  Location: [facility] OR;  Service: Vascular;  Laterality: Bilateral;    SOCIAL HISTORY: Social History   Socioeconomic History   Marital status: Married     Spouse name: Not on file   Number of children: 5   Years of education: Not on file   Highest education level: Master's degree (e.g., MA, MS, MEng, MEd, MSW, MBA)  Occupational History   Not on file  Tobacco Use   Smoking status: Every Day    Packs/day: 0.50    Years: 50.00    Total pack years: 25.00    Types: Cigarettes    Passive exposure: Never   Smokeless tobacco: Never  Vaping Use   Vaping Use: Never used  Substance and Sexual Activity   Alcohol use: Yes    Alcohol/week: 0.0 standard drinks of alcohol    Comment: 4 to 5 times a week; beer, wine and liquor    Drug use: No   Sexual activity: Not Currently  Other Topics Concern   Not on file  Social History Narrative   Scores reading tests.  Lives with wife.     Social Determinants of Health   Financial Resource Strain: Low Risk  (05/24/2022)   Overall Financial Resource Strain (CARDIA)    Difficulty of Paying Living Expenses: Not hard at all  Food Insecurity: No Food Insecurity (05/24/2022)   Hunger Vital Sign    Worried About Running Out of Food in the Last Year: Never true    Ran Out of Food in the Last Year: Never true  Transportation Needs: No Transportation Needs (05/24/2022)   PRAPARE - Hydrologist (Medical): No    Lack of Transportation (Non-Medical): No  Physical Activity: Insufficiently Active (01/08/2022)   Exercise Vital Sign    Days of Exercise per Week: 3 days    Minutes of Exercise per Session: 30 min  Stress: No Stress Concern Present (01/08/2022)   [place]    Feeling of Stress : Not at all  Social Connections: Moderately Isolated (01/08/2022)   Social Connection and Isolation Panel [NHANES]    Frequency of Communication with Friends and Family: More than three times a week    Frequency of Social Gatherings with Friends and Family: Three times a week    Attends Religious Services: Never    Active Member  of Clubs or Organizations: No    Attends Archivist Meetings: Not on file    Marital Status: Married  Human resources officer Violence: Not At Risk (06/07/2021)   Humiliation, Afraid, Rape, and Kick questionnaire    Fear of Current or Ex-Partner: No    Emotionally Abused: No    Physically Abused: No    Sexually Abused: No    FAMILY HISTORY: Family History  Problem Relation Age of Onset   Ovarian cancer Mother    Alcoholism Father    Breast cancer Sister    Ovarian cancer Sister     ALLERGIES:  is allergic to zithromax [azithromycin].  MEDICATIONS:  Current Outpatient Medications  Medication Sig Dispense Refill   acetaminophen (TYLENOL) 500 MG tablet Take 500 mg by mouth every 6 (six) hours as needed (for pain.).     cetirizine (ZYRTEC) 10  MG tablet Take 1 tablet (10 mg total) by mouth daily. (Patient taking differently: Take 10 mg by mouth daily as needed for allergies.) 30 tablet 11   cholecalciferol (VITAMIN D) 25 MCG (1000 UNIT) tablet Take 1,000 Units by mouth 2 (two) times daily.     dexamethasone (DECADRON) 4 MG tablet Take 2 tablets daily x 3 days starting the day after cisplatin chemotherapy. Take with food. 30 tablet 1   diltiazem (TIADYLT ER) 120 MG 24 hr capsule TAKE 1 CAPSULE EVERY DAY (SCHEDULE OFFICE VISIT FOR FUTURE REFILLS) 90 capsule 1   lidocaine (XYLOCAINE) 2 % solution Patient: Mix 1part 2% viscous lidocaine, 1part H20. Swish & swallow 27m of diluted mixture, 389m before meals  and at bedtime, up to QID prn sore throat. (Patient not taking: Reported on 06/22/2022) 200 mL 3   lidocaine (XYLOCAINE) 2 % solution Mix 1 part lidocaine with 1 part water, then swish and swallow 10 ml of mixture 30 minutes before meals and at bedtime up to 4 times daily as needed for sore throat 200 mL 0   lidocaine-prilocaine (EMLA) cream Apply to affected area once (Patient taking differently: Apply 1 Application topically as needed (before chemo). Apply to affected area once) 30 g 3    metoprolol tartrate (LOPRESSOR) 25 MG tablet Take 1 tablet (25 mg total) by mouth 2 (two) times daily as needed. 30 tablet 1   nicotine (NICODERM CQ - DOSED IN MG/24 HOURS) 21 mg/24hr patch Place 1 patch (21 mg total) onto the skin daily. Apply 21 mg patch daily x 6 wk, then '14mg'$  patch daily x 2 wk, then 7 mg patch daily x 2 wk 14 patch 2   omeprazole (PRILOSEC) 40 MG capsule Take 1 capsule (40 mg total) by mouth daily. 30 capsule 6   ondansetron (ZOFRAN) 8 MG tablet Take 1 tablet (8 mg total) by mouth every 8 (eight) hours as needed for nausea or vomiting. Start on the third day after cisplatin. 30 tablet 1   prochlorperazine (COMPAZINE) 10 MG tablet Take 1 tablet (10 mg total) by mouth every 6 (six) hours as needed (Nausea or vomiting). 30 tablet 1   rivaroxaban (XARELTO) 20 MG TABS tablet Take 1 tablet (20 mg total) by mouth daily with supper. 90 tablet 1   rosuvastatin (CRESTOR) 10 MG tablet Take 1 tablet (10 mg total) by mouth daily. 90 tablet 1   sodium chloride (OCEAN) 0.65 % SOLN nasal spray Place 1 spray into both nostrils as needed for congestion.     No current facility-administered medications for this visit.     PHYSICAL EXAMINATION: ECOG PERFORMANCE STATUS: 1 - Symptomatic but completely ambulatory  Vitals:   07/03/22 0833  BP: 94/63  Pulse: 79  Resp: 16  Temp: 97.7 F (36.5 C)  SpO2: 99%   Filed Weights   07/03/22 0833  Weight: 212 lb 11.2 oz (96.5 kg)    Physical Exam Constitutional:      Appearance: Normal appearance.  Cardiovascular:     Rate and Rhythm: Normal rate and regular rhythm.  Pulmonary:     Effort: Pulmonary effort is normal.     Breath sounds: Normal breath sounds.  Abdominal:     General: Abdomen is flat.     Palpations: Abdomen is soft.  Musculoskeletal:        General: No swelling or tenderness. Normal range of motion.     Cervical back: Normal range of motion and neck supple. No rigidity.  Lymphadenopathy:  Cervical: No cervical  adenopathy.  Skin:    General: Skin is warm and dry.  Neurological:     General: No focal deficit present.     Mental Status: He is alert.     LABORATORY DATA:  I have reviewed the data as listed Lab Results  Component Value Date   WBC 1.4 (L) 07/03/2022   HGB 12.7 (L) 07/03/2022   HCT 36.5 (L) 07/03/2022   MCV 95.3 07/03/2022   PLT 67 (L) 07/03/2022     Chemistry      Component Value Date/Time   NA 133 (L) 06/26/2022 1004   NA 141 05/16/2018 1425   K 3.7 06/26/2022 1004   CL 98 06/26/2022 1004   CO2 32 06/26/2022 1004   BUN 21 06/26/2022 1004   BUN 13 05/16/2018 1425   CREATININE 0.77 06/26/2022 1004   CREATININE 0.93 06/21/2022 1008      Component Value Date/Time   CALCIUM 8.6 (L) 06/26/2022 1004   ALKPHOS 36 (L) 06/26/2022 1004   AST 17 06/26/2022 1004   ALT 30 06/26/2022 1004   BILITOT 0.6 06/26/2022 1004       RADIOGRAPHIC STUDIES: I have personally reviewed the radiological images as listed and agreed with the findings in the report. IR Gastrostomy Tube  Result Date: 06/12/2022 INDICATION: 76 year old male with a history of head and neck cancer. He requires percutaneous gastrostomy tube placement prior to undergoing chemo radiation. EXAM: Fluoroscopically guided placement of percutaneous pull-through gastrostomy tube Interventional Radiologist:  Criselda Peaches, MD MEDICATIONS: 2 g Ancef; Antibiotics were administered within 1 hour of the procedure. ANESTHESIA/SEDATION: Versed 2 mg IV; Fentanyl 100 mcg IV administered by radiology nursing. Moderate Sedation Time:  49 minutes The patient's vital signs and level of consciousness were continuously monitored during the procedure by the interventional radiology nurse under my direct supervision. CONTRAST:  18m OMNIPAQUE IOHEXOL 300 MG/ML  SOLN FLUOROSCOPY: Fluoroscopy Time:  minutes  seconds ( mGy). COMPLICATIONS: None immediate. PROCEDURE: Informed written consent was obtained from the patient after a thorough  discussion of the procedural risks, benefits and alternatives. All questions were addressed. Maximal Sterile Barrier Technique was utilized including caps, mask, sterile gowns, sterile gloves, sterile drape, hand hygiene and skin antiseptic. A timeout was performed prior to the initiation of the procedure. Maximal barrier sterile technique utilized including caps, mask, sterile gowns, sterile gloves, large sterile drape, hand hygiene, and chlorhexadine skin prep. An angled catheter was advanced over a wire under fluoroscopic guidance through the nose, down the esophagus and into the body of the stomach. The stomach was then insufflated with several 100 ml of air. Fluoroscopy confirmed location of the gastric bubble, as well as inferior displacement of the barium stained colon. Under direct fluoroscopic guidance, a single T-tack was placed, and the anterior gastric wall drawn up against the anterior abdominal wall. Percutaneous access was then obtained into the mid gastric body with an 18 gauge sheath needle. Aspiration of air, and injection of contrast material under fluoroscopy confirmed needle placement. An Amplatz wire was advanced in the gastric body and the access needle exchanged for a 9-French vascular sheath. A snare device was advanced through the vascular sheath and an Amplatz wire advanced through the angled catheter. The Amplatz wire was successfully snared and this was pulled up through the esophagus and out the mouth. A 20-French KAlinda DoomsMIC-PEG tube was then connected to the snare and pulled through the mouth, down the esophagus, into the stomach and out to the  anterior abdominal wall. Hand injection of contrast material confirmed intragastric location. The T-tack retention suture was then cut. The pull through peg tube was then secured with the external bumper and capped. The patient will be observed for several hours with the newly placed tube on low wall suction to evaluate for any post  procedure complication. The patient tolerated the procedure well, there is no immediate complication. IMPRESSION: Successful placement of a 20 French pull through gastrostomy tube. Electronically Signed   By: Jacqulynn Cadet M.D.   On: 06/12/2022 13:43   IR IMAGING GUIDED PORT INSERTION  Result Date: 06/12/2022 INDICATION: 76 year old male with head and neck cancer. EXAM: IMPLANTED PORT A CATH PLACEMENT WITH ULTRASOUND AND FLUOROSCOPIC GUIDANCE MEDICATIONS: None. ANESTHESIA/SEDATION: None. FLUOROSCOPY: Radiation exposure index: 2 mGy reference air kerma COMPLICATIONS: None immediate. PROCEDURE: The right neck and chest was prepped with chlorhexidine, and draped in the usual sterile fashion using maximum barrier technique (cap and mask, sterile gown, sterile gloves, large sterile sheet, hand hygiene and cutaneous antiseptic). Local anesthesia was attained by infiltration with 1% lidocaine with epinephrine. Ultrasound demonstrated patency of the right internal jugular vein, and this was documented with an image. Under real-time ultrasound guidance, this vein was accessed with a 21 gauge micropuncture needle and image documentation was performed. A small dermatotomy was made at the access site with an 11 scalpel. A 0.018" wire was advanced into the SVC and the access needle exchanged for a 15F micropuncture vascular sheath. The 0.018" wire was then removed and a 0.035" wire advanced into the IVC. An appropriate location for the subcutaneous reservoir was selected below the clavicle and an incision was made through the skin and underlying soft tissues. The subcutaneous tissues were then dissected using a combination of blunt and sharp surgical technique and a pocket was formed. A single lumen power injectable portacatheter was then tunneled through the subcutaneous tissues from the pocket to the dermatotomy and the port reservoir placed within the subcutaneous pocket. The venous access site was then serially  dilated and a peel away vascular sheath placed over the wire. The wire was removed and the port catheter advanced into position under fluoroscopic guidance. The catheter tip is positioned in the superior cavoatrial junction. This was documented with a spot image. The portacatheter was then tested and found to flush and aspirate well. The port was flushed with saline followed by 100 units/mL heparinized saline. The pocket was then closed in two layers using first subdermal inverted interrupted absorbable sutures followed by a running subcuticular suture. The epidermis was then sealed with Dermabond. The dermatotomy at the venous access site was also closed with Dermabond. IMPRESSION: Successful placement of a right IJ approach Power Port with ultrasound and fluoroscopic guidance. The catheter is ready for use. Electronically Signed   By: Jacqulynn Cadet M.D.   On: 06/12/2022 13:40    ASSESSMENT AND PLAN:  Cory [name] is a 76 y.o. male who presents to the clinic for a follow up for SCC of supraglottis.  #SCC of supraglottis: --Started concurrent chemoradiation. Radiation was started on 05/30/2022. Weekly cisplatin started on 06/01/2022.  --Patient presents today for a toxicity evaluation before planned Cycle 6 of cisplatin scheduled for tomorrow.  -- He responded very to concurrent chemoradiation although his labs today showed worsening leukopenia, neutropenia as well as thrombocytopenia.  Hence we have agreed to defer treatment today.   We have clearly discussed that he needs to use the G-tube well, discussed with the nutritionist and make sure  he gets adequate intake of calories for [place] to continue treatment. He expressed understanding.  #Sore Throat/Dysphagia: --He has viscous lidocaine but hasn't been using it. --Mild sore throat so far --He will call us if the pain gets worse  #Lymph node biopsy: -Repeat biopsy inconclusive.  At this time since he has no clinical symptoms or bulky  lymphadenopathy, will try and prioritize treatment of supraglottis, finished chemoradiation and will repeat the PET/CT and follow-up on the abdominal lymphadenopathy that was noted.  All questions were answered. The patient knows to call the clinic with any problems, questions or concerns.  I have spent a total of 30 minutes minutes of face-to-face and non-face-to-face time, preparing to see the patient, performing a medically appropriate examination, counseling and educating the patient, documenting clinical information in the electronic health record, and care coordination.    Benay Pike MD

## 2022-07-03 NOTE — Telephone Encounter (Signed)
Per Dr.Iruku, pt is to receive IV fluids x3 this week. Pt was called and vmessage left with appt times and dates. 11/30 and 12/1 at 7:30 am. Advised to call office with questions

## 2022-07-03 NOTE — Progress Notes (Signed)
Per Dr.Iruku, cancelled pt chemo for 11/29 and will replace with IV fluids for 3 days this week if available. Charge nurse [employer] notified and is working on scheduling.

## 2022-07-04 ENCOUNTER — Ambulatory Visit
Admission: RE | Admit: 2022-07-04 | Discharge: 2022-07-04 | Disposition: A | Discharge status: Home / Self Care | Payer: Medicare HMO | Source: Ambulatory Visit | Attending: Radiation Oncology | Admitting: Radiation Oncology

## 2022-07-04 ENCOUNTER — Inpatient Hospital Stay: Payer: Medicare HMO | Admitting: Dietician

## 2022-07-04 ENCOUNTER — Other Ambulatory Visit: Payer: Self-pay

## 2022-07-04 ENCOUNTER — Inpatient Hospital Stay: Payer: Medicare HMO

## 2022-07-04 VITALS — BP 132/72 | HR 69 | Temp 97.8°F | Resp 18

## 2022-07-04 DIAGNOSIS — C321 Malignant neoplasm of supraglottis: Secondary | ICD-10-CM | POA: Diagnosis not present

## 2022-07-04 DIAGNOSIS — Z510 Encounter for antineoplastic radiation therapy: Secondary | ICD-10-CM | POA: Diagnosis not present

## 2022-07-04 DIAGNOSIS — Z95828 Presence of other vascular implants and grafts: Secondary | ICD-10-CM

## 2022-07-04 LAB — RAD ONC ARIA SESSION SUMMARY
Course Elapsed Days: 35
Plan Fractions Treated to Date: 25
Plan Prescribed Dose Per Fraction: 2 Gy
Plan Total Fractions Prescribed: 35
Plan Total Prescribed Dose: 70 Gy
Reference Point Dosage Given to Date: 50 Gy
Reference Point Session Dosage Given: 2 Gy
Session Number: 25

## 2022-07-04 MED ORDER — SODIUM CHLORIDE 0.9% FLUSH
10.0000 mL | Freq: Once | INTRAVENOUS | Status: AC
  Administered 2022-07-04: 10 mL

## 2022-07-04 MED ORDER — SODIUM CHLORIDE 0.9 % IV SOLN: Freq: Once | INTRAVENOUS | Status: AC

## 2022-07-04 MED ORDER — HEPARIN SOD (PORK) LOCK FLUSH 100 UNIT/ML IV SOLN
500.0000 [IU] | Freq: Once | INTRAVENOUS | Status: AC
  Administered 2022-07-04: 500 [IU]

## 2022-07-04 NOTE — Patient Instructions (Signed)

## 2022-07-04 NOTE — Progress Notes (Signed)
Nutrition Follow-up:  Pt with SCC of supraglottis. S/p PEG 11/7. Lymph node biopsy revealing atypical lymphocytes concerning for metastatic disease under workup. He is receiving concurrent chemoradiation with weekly cisplatin (first treatment 10/27).      11/29 - Treatment held secondary to worsening leukopenia, neutropenia, as well as thrombocytopenia  Met with patient during infusion. He is receiving IV fluids today. Patient reports increased fatigue. Patient endorses decreased oral intake due to dysphagia related to thick saliva. His throat is mildly sore. Patient recalls tolerating small amounts of soft moist foods (chicken, rice, scrambled eggs, smoked salmon). Patient has started using G-tube. He recalls giving 3 cartons of Osmolite 1.5 yesterday. Patient gave one carton this morning. He is flushing tube with 60 ml water before and after. He denies nausea, vomiting, diarrhea. Patient is taking miralax for constipation. He had a small bowel movement this morning.     Medications: reviewed   Labs: reviewed   Anthropometrics: Wt 212 lb 11.2 oz on 11/28  11/17 - 214 lb 5 oz 11/9 - 214 lb 6.4 oz  11/3 - 221 lb 6 oz   -9 lbs (4%) in the last 25 days - concerning   Estimated Energy Needs   Kcals: 2500-3000 Protein: 125-150 Fluid: >/= 2.5L   NUTRITION DIAGNOSIS: Predicted suboptimal intake ongoing has evolved into inadequate oral intake related to [facility] of supraglottis and associated treatment side effects as evidenced by dysphagia related to thick saliva, sore throat, concerning 4% wt loss in 25 days.     INTERVENTION:  Encouraged oral intake of soft moist foods as tolerated  Continue baking soda salt water rinses several times daily Continue ginger ale rinse for thick saliva Pt has complimentary case of Osmolite 1.5 Pt will continue giving 3 cartons Osmolite 1.5 daily and will increase to goal of 7 cartons/day as tolerated. Flush tube with 90 ml water before and after each feeding.  Drink by mouth or give via tube additional 474 ml (2 c) water. He will give 60 ml Prosource TF20 once daily to meet protein needs. This regimen will provide 1659 ml -  2565 kcal, 124 g protein, 1267 ml free water (2461 ml total water) Meets 100% RDI   MONITORING, EVALUATION, GOAL: weight trends, intake, tube feeding   NEXT VISIT: Wednesday December 6 during infusion

## 2022-07-05 ENCOUNTER — Inpatient Hospital Stay: Payer: Medicare HMO

## 2022-07-05 ENCOUNTER — Other Ambulatory Visit: Payer: Self-pay

## 2022-07-05 ENCOUNTER — Ambulatory Visit
Admission: RE | Admit: 2022-07-05 | Discharge: 2022-07-05 | Disposition: A | Discharge status: Home / Self Care | Payer: Medicare HMO | Source: Ambulatory Visit | Attending: Radiation Oncology | Admitting: Radiation Oncology

## 2022-07-05 VITALS — BP 120/70 | HR 76 | Temp 97.6°F | Resp 18

## 2022-07-05 DIAGNOSIS — Z510 Encounter for antineoplastic radiation therapy: Secondary | ICD-10-CM | POA: Diagnosis not present

## 2022-07-05 DIAGNOSIS — C321 Malignant neoplasm of supraglottis: Secondary | ICD-10-CM

## 2022-07-05 DIAGNOSIS — Z95828 Presence of other vascular implants and grafts: Secondary | ICD-10-CM

## 2022-07-05 LAB — RAD ONC ARIA SESSION SUMMARY
Course Elapsed Days: 36
Plan Fractions Treated to Date: 26
Plan Prescribed Dose Per Fraction: 2 Gy
Plan Total Fractions Prescribed: 35
Plan Total Prescribed Dose: 70 Gy
Reference Point Dosage Given to Date: 52 Gy
Reference Point Session Dosage Given: 2 Gy
Session Number: 26

## 2022-07-05 LAB — SURGICAL PATHOLOGY

## 2022-07-05 MED ORDER — SODIUM CHLORIDE 0.9 % IV SOLN: Freq: Once | INTRAVENOUS | Status: AC

## 2022-07-05 MED ORDER — SODIUM CHLORIDE 0.9% FLUSH
10.0000 mL | Freq: Once | INTRAVENOUS | Status: AC
  Administered 2022-07-05: 10 mL

## 2022-07-05 MED ORDER — HEPARIN SOD (PORK) LOCK FLUSH 100 UNIT/ML IV SOLN
500.0000 [IU] | Freq: Once | INTRAVENOUS | Status: AC
  Administered 2022-07-05: 500 [IU]

## 2022-07-05 NOTE — Patient Instructions (Signed)

## 2022-07-06 ENCOUNTER — Ambulatory Visit
Admission: RE | Admit: 2022-07-06 | Discharge: 2022-07-06 | Disposition: A | Discharge status: Home / Self Care | Payer: Medicare HMO | Source: Ambulatory Visit | Attending: Radiation Oncology | Admitting: Radiation Oncology

## 2022-07-06 ENCOUNTER — Other Ambulatory Visit: Payer: Self-pay

## 2022-07-06 ENCOUNTER — Inpatient Hospital Stay: Payer: Medicare HMO

## 2022-07-06 DIAGNOSIS — Z452 Encounter for adjustment and management of vascular access device: Secondary | ICD-10-CM | POA: Insufficient documentation

## 2022-07-06 DIAGNOSIS — D649 Anemia, unspecified: Secondary | ICD-10-CM | POA: Insufficient documentation

## 2022-07-06 DIAGNOSIS — J029 Acute pharyngitis, unspecified: Secondary | ICD-10-CM | POA: Insufficient documentation

## 2022-07-06 DIAGNOSIS — I480 Paroxysmal atrial fibrillation: Secondary | ICD-10-CM | POA: Insufficient documentation

## 2022-07-06 DIAGNOSIS — K1231 Oral mucositis (ulcerative) due to antineoplastic therapy: Secondary | ICD-10-CM | POA: Insufficient documentation

## 2022-07-06 DIAGNOSIS — Z510 Encounter for antineoplastic radiation therapy: Secondary | ICD-10-CM | POA: Diagnosis not present

## 2022-07-06 DIAGNOSIS — C321 Malignant neoplasm of supraglottis: Secondary | ICD-10-CM | POA: Insufficient documentation

## 2022-07-06 DIAGNOSIS — Z7901 Long term (current) use of anticoagulants: Secondary | ICD-10-CM | POA: Insufficient documentation

## 2022-07-06 DIAGNOSIS — Z5111 Encounter for antineoplastic chemotherapy: Secondary | ICD-10-CM | POA: Insufficient documentation

## 2022-07-06 LAB — RAD ONC ARIA SESSION SUMMARY
Course Elapsed Days: 37
Plan Fractions Treated to Date: 27
Plan Prescribed Dose Per Fraction: 2 Gy
Plan Total Fractions Prescribed: 35
Plan Total Prescribed Dose: 70 Gy
Reference Point Dosage Given to Date: 54 Gy
Reference Point Session Dosage Given: 2 Gy
Session Number: 27

## 2022-07-06 MED ORDER — SODIUM CHLORIDE 0.9% FLUSH
10.0000 mL | Freq: Once | INTRAVENOUS | Status: AC
  Administered 2022-07-06: 10 mL via INTRAVENOUS

## 2022-07-06 MED ORDER — SODIUM CHLORIDE 0.9 % IV SOLN: Freq: Once | INTRAVENOUS | Status: AC

## 2022-07-06 MED ORDER — HEPARIN SOD (PORK) LOCK FLUSH 100 UNIT/ML IV SOLN
500.0000 [IU] | Freq: Once | INTRAVENOUS | Status: AC
  Administered 2022-07-06: 500 [IU] via INTRAVENOUS

## 2022-07-09 ENCOUNTER — Ambulatory Visit
Admission: RE | Admit: 2022-07-09 | Discharge: 2022-07-09 | Disposition: A | Discharge status: Home / Self Care | Payer: Medicare HMO | Source: Ambulatory Visit | Attending: Radiation Oncology | Admitting: Radiation Oncology

## 2022-07-09 ENCOUNTER — Other Ambulatory Visit: Payer: Self-pay

## 2022-07-09 DIAGNOSIS — Z510 Encounter for antineoplastic radiation therapy: Secondary | ICD-10-CM | POA: Diagnosis not present

## 2022-07-09 DIAGNOSIS — C321 Malignant neoplasm of supraglottis: Secondary | ICD-10-CM | POA: Diagnosis not present

## 2022-07-09 LAB — RAD ONC ARIA SESSION SUMMARY
Course Elapsed Days: 40
Plan Fractions Treated to Date: 28
Plan Prescribed Dose Per Fraction: 2 Gy
Plan Total Fractions Prescribed: 35
Plan Total Prescribed Dose: 70 Gy
Reference Point Dosage Given to Date: 56 Gy
Reference Point Session Dosage Given: 2 Gy
Session Number: 28

## 2022-07-09 MED ORDER — SONAFINE EX EMUL
1.0000 | Freq: Two times a day (BID) | CUTANEOUS | Status: DC
  Administered 2022-07-09: 1 via TOPICAL

## 2022-07-10 ENCOUNTER — Ambulatory Visit
Admission: RE | Admit: 2022-07-10 | Discharge: 2022-07-10 | Disposition: A | Discharge status: Home / Self Care | Payer: Medicare HMO | Source: Ambulatory Visit | Attending: Radiation Oncology | Admitting: Radiation Oncology

## 2022-07-10 ENCOUNTER — Other Ambulatory Visit: Payer: Self-pay

## 2022-07-10 ENCOUNTER — Inpatient Hospital Stay: Payer: Medicare HMO

## 2022-07-10 ENCOUNTER — Telehealth: Payer: Self-pay | Admitting: *Deleted

## 2022-07-10 ENCOUNTER — Inpatient Hospital Stay (HOSPITAL_BASED_OUTPATIENT_CLINIC_OR_DEPARTMENT_OTHER): Payer: Medicare HMO | Admitting: Hematology and Oncology

## 2022-07-10 VITALS — BP 128/83 | HR 74 | Temp 98.0°F | Resp 16 | Ht 76.0 in | Wt 215.7 lb

## 2022-07-10 DIAGNOSIS — C321 Malignant neoplasm of supraglottis: Secondary | ICD-10-CM | POA: Diagnosis not present

## 2022-07-10 DIAGNOSIS — Z95828 Presence of other vascular implants and grafts: Secondary | ICD-10-CM

## 2022-07-10 DIAGNOSIS — Z510 Encounter for antineoplastic radiation therapy: Secondary | ICD-10-CM | POA: Diagnosis not present

## 2022-07-10 LAB — CBC WITH DIFFERENTIAL/PLATELET
Abs Immature Granulocytes: 0 10*3/uL (ref 0.00–0.07)
Basophils Absolute: 0 10*3/uL (ref 0.0–0.1)
Basophils Relative: 0 %
Eosinophils Absolute: 0 10*3/uL (ref 0.0–0.5)
Eosinophils Relative: 3 %
HCT: 33.2 % — ABNORMAL LOW (ref 39.0–52.0)
Hemoglobin: 11.5 g/dL — ABNORMAL LOW (ref 13.0–17.0)
Immature Granulocytes: 0 %
Lymphocytes Relative: 23 %
Lymphs Abs: 0.3 10*3/uL — ABNORMAL LOW (ref 0.7–4.0)
MCH: 33.1 pg (ref 26.0–34.0)
MCHC: 34.6 g/dL (ref 30.0–36.0)
MCV: 95.7 fL (ref 80.0–100.0)
Monocytes Absolute: 0.3 10*3/uL (ref 0.1–1.0)
Monocytes Relative: 23 %
Neutro Abs: 0.7 10*3/uL — ABNORMAL LOW (ref 1.7–7.7)
Neutrophils Relative %: 51 %
Platelets: 133 10*3/uL — ABNORMAL LOW (ref 150–400)
RBC: 3.47 MIL/uL — ABNORMAL LOW (ref 4.22–5.81)
RDW: 14 % (ref 11.5–15.5)
WBC: 1.3 10*3/uL — ABNORMAL LOW (ref 4.0–10.5)
nRBC: 0 % (ref 0.0–0.2)

## 2022-07-10 LAB — RAD ONC ARIA SESSION SUMMARY
Course Elapsed Days: 41
Plan Fractions Treated to Date: 29
Plan Prescribed Dose Per Fraction: 2 Gy
Plan Total Fractions Prescribed: 35
Plan Total Prescribed Dose: 70 Gy
Reference Point Dosage Given to Date: 58 Gy
Reference Point Session Dosage Given: 2 Gy
Session Number: 29

## 2022-07-10 LAB — COMPREHENSIVE METABOLIC PANEL
ALT: 14 U/L (ref 0–44)
AST: 10 U/L — ABNORMAL LOW (ref 15–41)
Albumin: 3.5 g/dL (ref 3.5–5.0)
Alkaline Phosphatase: 49 U/L (ref 38–126)
Anion gap: 3 — ABNORMAL LOW (ref 5–15)
BUN: 12 mg/dL (ref 8–23)
CO2: 31 mmol/L (ref 22–32)
Calcium: 9.3 mg/dL (ref 8.9–10.3)
Chloride: 102 mmol/L (ref 98–111)
Creatinine, Ser: 0.71 mg/dL (ref 0.61–1.24)
GFR, Estimated: >60 mL/min (ref >60)
Glucose, Bld: 103 mg/dL — ABNORMAL HIGH (ref 70–99)
Potassium: 3.9 mmol/L (ref 3.5–5.1)
Sodium: 136 mmol/L (ref 135–145)
Total Bilirubin: 0.3 mg/dL (ref 0.3–1.2)
Total Protein: 5.9 g/dL — ABNORMAL LOW (ref 6.5–8.1)

## 2022-07-10 LAB — MAGNESIUM: Magnesium: 2 mg/dL (ref 1.7–2.4)

## 2022-07-10 MED ORDER — SODIUM CHLORIDE 0.9% FLUSH: 10.0000 mL | Freq: Once | INTRAVENOUS | Status: DC

## 2022-07-10 MED FILL — Fosaprepitant Dimeglumine For IV Infusion 150 MG (Base Eq): INTRAVENOUS | Qty: 5 | Status: AC

## 2022-07-10 MED FILL — Dexamethasone Sodium Phosphate Inj 100 MG/10ML: INTRAMUSCULAR | Qty: 1 | Status: AC

## 2022-07-10 NOTE — Progress Notes (Signed)
[place] NOTE  Patient Care Team: [name], MD as PCP - General [name], MD as PCP - Cardiology (Cardiology) Viona Gilmore, [facility] as Pharmacist (Pharmacist) Malmfelt, Stephani Police, RN as Oncology Nurse Navigator [name], MD as Consulting Physician (Radiation Oncology) Benay Pike, MD as Consulting Physician (Hematology and Oncology)  CHIEF COMPLAINTS/PURPOSE OF CONSULTATION:  SCC supraglottis  ONCOLOGIC HISTORY: Initially presented to ENT with chronic left-sided sore throat for almost a year.  Initial evaluation did not show any possible causes for sore throat.  He was recommended PPI twice daily but went back to ENT with persistent sore throat he once again had a repeat fiberoptic exam which was stable.   CT neck done on November 08, 2021 did show subcentimeter focus of mucosal hyperenhancement along the posterior aspect of the uvula  laryngoscopy with direct visualization recommended to exclude a mucosal lesion at the site.  Additionally few tiny cystic-appearing foci along the left glossotonsillar sulcus.  Direct visualization recommended.  Nonspecific mildly enlarged right level 2 lymph node.  Multiple small calcific foci in the left palatine tonsil.Repeat fiberoptic exam was unremarkable hence Dr. 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  Tongue biopsy showed benign squamous mucosa with low to intermediate grade dysplasia. 05/07/2022: PET/CT showed asymmetric hypermetabolism in the left oropharynx and involving the left epiglottis.  Contralateral hypermetabolic level 2 cervical lymph nodes compatible with metastatic disease.  No hypermetabolic adenopathy in the left neck or in the chest.  1.2 cm short axis aortocaval lymph node in the upper abdomen is  hypermetabolic metastatic disease is a concern.  This lymph node has increased from 0.7 cm on the study from 2022.  No other metastatic disease. Started radiation on 05/30/2022 and weekly cisplatin on 06/01/2022.   INTERIM HISTORY:  Cory [name] returns for a toxicity check before his Cycle 5 weekly cisplatin infusion scheduled for tomorrow.  Since last visit, he has been using G tube, gained 3 lbs of weight. Sore throat is mild, has been using tylenol as needed and viscous lidocaine as needed. No change in breathing, No change in bowel habits or urinary habits. Hearing may have gotten a little worse, can't tell if its one side or both. No tingling and numbness Rest of the pertinent 10 point ROS reviewed and negative  MEDICAL HISTORY:  Past Medical History:  Diagnosis Date   Aortic stenosis    a. Moderate by echo 05/2020.   Arthritis    right hip   Cancer ([place])    skin cancer nose, right left, upper left basel cell   Dysrhythmia    a-fib   PAF (paroxysmal atrial fibrillation) (HCC)    Peripheral arterial disease (HCC)    Tobacco abuse     SURGICAL HISTORY: Past Surgical History:  Procedure Laterality Date   ABDOMINAL AORTIC ENDOVASCULAR STENT GRAFT N/A 08/29/2020   Procedure: ABDOMINAL AORTIC ENDOVASCULAR STENT GRAFT;  Surgeon: Elam Dutch, MD;  Location: [place];  Service: Vascular;  Laterality: N/A;   ABDOMINAL AORTOGRAM W/LOWER EXTREMITY Bilateral 06/24/2020   Procedure: ABDOMINAL AORTOGRAM W/LOWER EXTREMITY;  Surgeon: Elam Dutch, MD;  Location: [place] [facility];  Service: Cardiovascular;  Laterality: Bilateral;   BACK SURGERY     2005   BIOPSY  05/21/2022   Procedure: BIOPSY;  Surgeon: Rush Landmark Telford Nab., MD;  Location: [facility];  Service: Gastroenterology;;   CERVICAL SPINE SURGERY     DIRECT LARYNGOSCOPY Bilateral 04/18/2022   Procedure: DIRECT LARYNGOSCOPY WITH BIOPSIES;  Surgeon: Melida Quitter, MD;  Location: [place];  Service: ENT;  Laterality:  Bilateral;   EMBOLECTOMY Right 08/29/2020   Procedure: POPLITEAL AND TIBIAL EMBOLECTOMY;  Surgeon: Elam Dutch, MD;  Location: [place];  Service: Vascular;  Laterality: Right;   EMBOLIZATION Right 06/24/2020   Procedure: EMBOLIZATION;  Surgeon: Elam Dutch, MD;  Location: [place] [facility];  Service: Cardiovascular;  Laterality: Right;   ENDARTERECTOMY FEMORAL Right 08/29/2020   Procedure: ENDARTERECTOMY FEMORAL;  Surgeon: Elam Dutch, MD;  Location[facility] OR;  Service: Vascular;  Laterality: Right;   ESOPHAGOGASTRODUODENOSCOPY N/A 06/27/2022   Procedure: ESOPHAGOGASTRODUODENOSCOPY (EGD);  Surgeon: Irving Copas., MD;  Location: [facility];  Service: Gastroenterology;  Laterality: N/A;   ESOPHAGOGASTRODUODENOSCOPY (EGD) WITH PROPOFOL N/A 05/21/2022   Procedure: ESOPHAGOGASTRODUODENOSCOPY (EGD) WITH PROPOFOL;  Surgeon: Rush Landmark Telford Nab., MD;  Location: [facility];  Service: Gastroenterology;  Laterality: N/A;   EUS N/A 05/21/2022   Procedure: UPPER ENDOSCOPIC ULTRASOUND (EUS) RADIAL;  Surgeon: Irving Copas., MD;  Location: [facility];  Service: Gastroenterology;  Laterality: N/A;   EUS N/A 06/27/2022   Procedure: UPPER ENDOSCOPIC ULTRASOUND (EUS) RADIAL;  Surgeon: Irving Copas., MD;  Location: [facility];  Service: Gastroenterology;  Laterality: N/A;   EYE SURGERY     FEMORAL ARTERY EXPLORATION Right 06/25/2020   Procedure: RIGHT GROIN EXPLORATION Evacuation of Hematoma  WITH REPAIR OF RIGHT Common FEMORAL ARTERY.;  Surgeon: Angelia Mould, MD;  Location: [place];  Service: Vascular;  Laterality: Right;   FEMORAL-POPLITEAL BYPASS GRAFT Left 11/22/2020   Procedure: LEFT ABOVE KNEE-BELOW KNEE BYPASS TIBIAL PERITONEAL TRUNK REVERSE IPSILATERAL GREATER SAPHENOUS VEIN;  Surgeon: Elam Dutch, MD;  Location: [facility] OR;  Service: Vascular;  Laterality: Left;   FINE NEEDLE ASPIRATION N/A 05/21/2022   Procedure: FINE NEEDLE ASPIRATION (FNA)  LINEAR;  Surgeon: Irving Copas., MD;  Location: [facility];  Service: Gastroenterology;  Laterality: N/A;   FINE NEEDLE ASPIRATION  06/27/2022   Procedure: FINE NEEDLE ASPIRATION;  Surgeon: Rush Landmark Telford Nab., MD;  Location: [facility];  Service: Gastroenterology;;   HOT HEMOSTASIS N/A 06/27/2022   Procedure: HOT HEMOSTASIS (ARGON PLASMA COAGULATION/BICAP);  Surgeon: Irving Copas., MD;  Location: [facility];  Service: Gastroenterology;  Laterality: N/A;   IR GASTROSTOMY TUBE MOD SED  06/12/2022   IR IMAGING GUIDED PORT INSERTION  06/12/2022   LEG SURGERY     Trauma   LOWER EXTREMITY ANGIOGRAPHY Left 11/17/2020   Procedure: Lower Extremity Angiography;  Surgeon: Marty Heck, MD;  Location: [place] [facility];  Service: Cardiovascular;  Laterality: Left;   PATCH ANGIOPLASTY Right 08/29/2020   Procedure: PATCH ANGIOPLASTY RIGHT BELOW KNEE POPLITEAL ARTERY AND RIGHT COMMON FEMORAL ARTERY;  Surgeon: Elam Dutch, MD;  Location: [place];  Service: Vascular;  Laterality: Right;   RETINAL DETACHMENT SURGERY     RIGID ESOPHAGOSCOPY  04/18/2022   Procedure: RIGID ESOPHAGOSCOPY;  Surgeon: Melida Quitter, MD;  Location: [place];  Service: ENT;;   TONSILLECTOMY  04/18/2022   Procedure: LEFT TONSILLECTOMY;  Surgeon: Melida Quitter, MD;  Location: [place];  Service: ENT;;   ULTRASOUND GUIDANCE FOR VASCULAR ACCESS Bilateral 08/29/2020   Procedure: ULTRASOUND GUIDANCE FOR VASCULAR ACCESS;  Surgeon: Elam Dutch, MD;  Location: [facility] OR;  Service: Vascular;  Laterality: Bilateral;    SOCIAL HISTORY: Social History   Socioeconomic History  Marital status: Married    Spouse name: Not on file   Number of children: 5   Years of education: Not on file   Highest education level: Master's degree (e.g., MA, MS, MEng, MEd, MSW, MBA)  Occupational History   Not on file  Tobacco Use   Smoking status: Every Day    Packs/day: 0.50    Years: 50.00    Total pack years: 25.00     Types: Cigarettes    Passive exposure: Never   Smokeless tobacco: Never  Vaping Use   Vaping Use: Never used  Substance and Sexual Activity   Alcohol use: Yes    Alcohol/week: 0.0 standard drinks of alcohol    Comment: 4 to 5 times a week; beer, wine and liquor    Drug use: No   Sexual activity: Not Currently  Other Topics Concern   Not on file  Social History Narrative   Scores reading tests.  Lives with wife.     Social Determinants of Health   Financial Resource Strain: Low Risk  (05/24/2022)   Overall Financial Resource Strain (CARDIA)    Difficulty of Paying Living Expenses: Not hard at all  Food Insecurity: No Food Insecurity (05/24/2022)   Hunger Vital Sign    Worried About Running Out of Food in the Last Year: Never true    Ran Out of Food in the Last Year: Never true  Transportation Needs: No Transportation Needs (05/24/2022)   PRAPARE - Hydrologist (Medical): No    Lack of Transportation (Non-Medical): No  Physical Activity: Insufficiently Active (01/08/2022)   Exercise Vital Sign    Days of Exercise per Week: 3 days    Minutes of Exercise per Session: 30 min  Stress: No Stress Concern Present (01/08/2022)   [place]    Feeling of Stress : Not at all  Social Connections: Moderately Isolated (01/08/2022)   Social Connection and Isolation Panel [NHANES]    Frequency of Communication with Friends and Family: More than three times a week    Frequency of Social Gatherings with Friends and Family: Three times a week    Attends Religious Services: Never    Active Member of Clubs or Organizations: No    Attends Archivist Meetings: Not on file    Marital Status: Married  Human resources officer Violence: Not At Risk (06/07/2021)   Humiliation, Afraid, Rape, and Kick questionnaire    Fear of Current or Ex-Partner: No    Emotionally Abused: No    Physically Abused: No     Sexually Abused: No    FAMILY HISTORY: Family History  Problem Relation Age of Onset   Ovarian cancer Mother    Alcoholism Father    Breast cancer Sister    Ovarian cancer Sister     ALLERGIES:  is allergic to zithromax [azithromycin].  MEDICATIONS:  Current Outpatient Medications  Medication Sig Dispense Refill   acetaminophen (TYLENOL) 500 MG tablet Take 500 mg by mouth every 6 (six) hours as needed (for pain.).     cetirizine (ZYRTEC) 10 MG tablet Take 1 tablet (10 mg total) by mouth daily. (Patient taking differently: Take 10 mg by mouth daily as needed for allergies.) 30 tablet 11   cholecalciferol (VITAMIN D) 25 MCG (1000 UNIT) tablet Take 1,000 Units by mouth 2 (two) times daily.     dexamethasone (DECADRON) 4 MG tablet Take 2 tablets daily x 3 days starting the  day after cisplatin chemotherapy. Take with food. 30 tablet 1   diltiazem (TIADYLT ER) 120 MG 24 hr capsule TAKE 1 CAPSULE EVERY DAY (SCHEDULE OFFICE VISIT FOR FUTURE REFILLS) 90 capsule 1   lidocaine (XYLOCAINE) 2 % solution Patient: Mix 1part 2% viscous lidocaine, 1part H20. Swish & swallow 22m of diluted mixture, 326m before meals  and at bedtime, up to QID prn sore throat. (Patient not taking: Reported on 06/22/2022) 200 mL 3   lidocaine (XYLOCAINE) 2 % solution Mix 1 part lidocaine with 1 part water, then swish and swallow 10 ml of mixture 30 minutes before meals and at bedtime up to 4 times daily as needed for sore throat 200 mL 0   lidocaine-prilocaine (EMLA) cream Apply to affected area once (Patient taking differently: Apply 1 Application topically as needed (before chemo). Apply to affected area once) 30 g 3   metoprolol tartrate (LOPRESSOR) 25 MG tablet Take 1 tablet (25 mg total) by mouth 2 (two) times daily as needed. 30 tablet 1   nicotine (NICODERM CQ - DOSED IN MG/24 HOURS) 21 mg/24hr patch Place 1 patch (21 mg total) onto the skin daily. Apply 21 mg patch daily x 6 wk, then '14mg'$  patch daily x 2 wk, then 7  mg patch daily x 2 wk 14 patch 2   omeprazole (PRILOSEC) 40 MG capsule Take 1 capsule (40 mg total) by mouth daily. 30 capsule 6   ondansetron (ZOFRAN) 8 MG tablet Take 1 tablet (8 mg total) by mouth every 8 (eight) hours as needed for nausea or vomiting. Start on the third day after cisplatin. 30 tablet 1   prochlorperazine (COMPAZINE) 10 MG tablet Take 1 tablet (10 mg total) by mouth every 6 (six) hours as needed (Nausea or vomiting). 30 tablet 1   rivaroxaban (XARELTO) 20 MG TABS tablet Take 1 tablet (20 mg total) by mouth daily with supper. 90 tablet 1   rosuvastatin (CRESTOR) 10 MG tablet Take 1 tablet (10 mg total) by mouth daily. 90 tablet 1   sodium chloride (OCEAN) 0.65 % SOLN nasal spray Place 1 spray into both nostrils as needed for congestion.     No current facility-administered medications for this visit.   Facility-Administered Medications Ordered in Other Visits  Medication Dose Route Frequency Provider Last Rate Last Admin   sodium chloride flush (NS) 0.9 % injection 10 mL  10 mL Intracatheter Once Zelia Yzaguirre, PrArletha PiliMD         PHYSICAL EXAMINATION: ECOG PERFORMANCE STATUS: 1 - Symptomatic but completely ambulatory  Vitals:   07/10/22 1019  BP: 128/83  Pulse: 74  Resp: 16  Temp: 98 F (36.7 C)  SpO2: 100%   Filed Weights   07/10/22 1019  Weight: 215 lb 11.2 oz (97.8 kg)    Physical Exam Constitutional:      Appearance: Normal appearance.  Cardiovascular:     Rate and Rhythm: Normal rate and regular rhythm.  Pulmonary:     Effort: Pulmonary effort is normal.     Breath sounds: Normal breath sounds.  Abdominal:     General: Abdomen is flat.     Palpations: Abdomen is soft.     Comments: G tube site appears well   Musculoskeletal:        General: No swelling or tenderness. Normal range of motion.     Cervical back: Normal range of motion and neck supple. No rigidity.  Lymphadenopathy:     Cervical: No cervical adenopathy.  Skin:    General:  Skin is warm  and dry.  Neurological:     General: No focal deficit present.     Mental Status: He is alert.      LABORATORY DATA:  I have reviewed the data as listed Lab Results  Component Value Date   WBC 1.4 (L) 07/03/2022   HGB 12.7 (L) 07/03/2022   HCT 36.5 (L) 07/03/2022   MCV 95.3 07/03/2022   PLT 67 (L) 07/03/2022     Chemistry      Component Value Date/Time   NA 137 07/03/2022 0812   NA 141 05/16/2018 1425   K 3.6 07/03/2022 0812   CL 100 07/03/2022 0812   CO2 32 07/03/2022 0812   BUN 13 07/03/2022 0812   BUN 13 05/16/2018 1425   CREATININE 0.84 07/03/2022 0812      Component Value Date/Time   CALCIUM 9.6 07/03/2022 0812   ALKPHOS 36 (L) 06/26/2022 1004   AST 17 06/26/2022 1004   ALT 30 06/26/2022 1004   BILITOT 0.6 06/26/2022 1004       RADIOGRAPHIC STUDIES: I have personally reviewed the radiological images as listed and agreed with the findings in the report. IR Gastrostomy Tube  Result Date: 06/12/2022 INDICATION: 76 year old male with a history of head and neck cancer. He requires percutaneous gastrostomy tube placement prior to undergoing chemo radiation. EXAM: Fluoroscopically guided placement of percutaneous pull-through gastrostomy tube Interventional Radiologist:  Criselda Peaches, MD MEDICATIONS: 2 g Ancef; Antibiotics were administered within 1 hour of the procedure. ANESTHESIA/SEDATION: Versed 2 mg IV; Fentanyl 100 mcg IV administered by radiology nursing. Moderate Sedation Time:  49 minutes The patient's vital signs and level of consciousness were continuously monitored during the procedure by the interventional radiology nurse under my direct supervision. CONTRAST:  11m OMNIPAQUE IOHEXOL 300 MG/ML  SOLN FLUOROSCOPY: Fluoroscopy Time:  minutes  seconds ( mGy). COMPLICATIONS: None immediate. PROCEDURE: Informed written consent was obtained from the patient after a thorough discussion of the procedural risks, benefits and alternatives. All questions were  addressed. Maximal Sterile Barrier Technique was utilized including caps, mask, sterile gowns, sterile gloves, sterile drape, hand hygiene and skin antiseptic. A timeout was performed prior to the initiation of the procedure. Maximal barrier sterile technique utilized including caps, mask, sterile gowns, sterile gloves, large sterile drape, hand hygiene, and chlorhexadine skin prep. An angled catheter was advanced over a wire under fluoroscopic guidance through the nose, down the esophagus and into the body of the stomach. The stomach was then insufflated with several 100 ml of air. Fluoroscopy confirmed location of the gastric bubble, as well as inferior displacement of the barium stained colon. Under direct fluoroscopic guidance, a single T-tack was placed, and the anterior gastric wall drawn up against the anterior abdominal wall. Percutaneous access was then obtained into the mid gastric body with an 18 gauge sheath needle. Aspiration of air, and injection of contrast material under fluoroscopy confirmed needle placement. An Amplatz wire was advanced in the gastric body and the access needle exchanged for a 9-French vascular sheath. A snare device was advanced through the vascular sheath and an Amplatz wire advanced through the angled catheter. The Amplatz wire was successfully snared and this was pulled up through the esophagus and out the mouth. A 20-French KAlinda DoomsMIC-PEG tube was then connected to the snare and pulled through the mouth, down the esophagus, into the stomach and out to the anterior abdominal wall. Hand injection of contrast material confirmed intragastric location. The T-tack retention suture was then  cut. The pull through peg tube was then secured with the external bumper and capped. The patient will be observed for several hours with the newly placed tube on low wall suction to evaluate for any post procedure complication. The patient tolerated the procedure well, there is no  immediate complication. IMPRESSION: Successful placement of a 20 French pull through gastrostomy tube. Electronically Signed   By: Jacqulynn Cadet M.D.   On: 06/12/2022 13:43   IR IMAGING GUIDED PORT INSERTION  Result Date: 06/12/2022 INDICATION: 76 year old male with head and neck cancer. EXAM: IMPLANTED PORT A CATH PLACEMENT WITH ULTRASOUND AND FLUOROSCOPIC GUIDANCE MEDICATIONS: None. ANESTHESIA/SEDATION: None. FLUOROSCOPY: Radiation exposure index: 2 mGy reference air kerma COMPLICATIONS: None immediate. PROCEDURE: The right neck and chest was prepped with chlorhexidine, and draped in the usual sterile fashion using maximum barrier technique (cap and mask, sterile gown, sterile gloves, large sterile sheet, hand hygiene and cutaneous antiseptic). Local anesthesia was attained by infiltration with 1% lidocaine with epinephrine. Ultrasound demonstrated patency of the right internal jugular vein, and this was documented with an image. Under real-time ultrasound guidance, this vein was accessed with a 21 gauge micropuncture needle and image documentation was performed. A small dermatotomy was made at the access site with an 11 scalpel. A 0.018" wire was advanced into the SVC and the access needle exchanged for a 62F micropuncture vascular sheath. The 0.018" wire was then removed and a 0.035" wire advanced into the IVC. An appropriate location for the subcutaneous reservoir was selected below the clavicle and an incision was made through the skin and underlying soft tissues. The subcutaneous tissues were then dissected using a combination of blunt and sharp surgical technique and a pocket was formed. A single lumen power injectable portacatheter was then tunneled through the subcutaneous tissues from the pocket to the dermatotomy and the port reservoir placed within the subcutaneous pocket. The venous access site was then serially dilated and a peel away vascular sheath placed over the wire. The wire was removed  and the port catheter advanced into position under fluoroscopic guidance. The catheter tip is positioned in the superior cavoatrial junction. This was documented with a spot image. The portacatheter was then tested and found to flush and aspirate well. The port was flushed with saline followed by 100 units/mL heparinized saline. The pocket was then closed in two layers using first subdermal inverted interrupted absorbable sutures followed by a running subcuticular suture. The epidermis was then sealed with Dermabond. The dermatotomy at the venous access site was also closed with Dermabond. IMPRESSION: Successful placement of a right IJ approach Power Port with ultrasound and fluoroscopic guidance. The catheter is ready for use. Electronically Signed   By: Jacqulynn Cadet M.D.   On: 06/12/2022 13:40    ASSESSMENT AND PLAN:  Cory [name] is a 76 y.o. male who presents to the clinic for a follow up for SCC of supraglottis.  #SCC of supraglottis: --Started concurrent chemoradiation. Radiation was started on 05/30/2022. Weekly cisplatin started on 06/01/2022.  --Patient presents today for a toxicity evaluation before planned Cycle 6 of cisplatin scheduled for tomorrow.  -- He responded very to concurrent chemoradiation although his labs today show ongoing leukopenia, neutropenia.  Unfortunately we cannot proceed with treatment tomorrow as planned.  Will have to defer chemotherapy once again by a week.   He expressed understanding.  #Sore Throat/Dysphagia: --He has been using Tylenol and viscous lidocaine as needed.  He does not require any other pain medication at this time.  #  Lymph node biopsy: -Repeat biopsy inconclusive.  At this time since he has no clinical symptoms or bulky lymphadenopathy, will try and prioritize treatment of supraglottis, finished chemoradiation and will repeat the PET/CT and follow-up on the abdominal lymphadenopathy that was noted.  All questions were answered. The patient  knows to call the clinic with any problems, questions or concerns.  I have spent a total of 30 minutes minutes of face-to-face and non-face-to-face time, preparing to see the patient, performing a medically appropriate examination, counseling and educating the patient, documenting clinical information in the electronic health record, and care coordination.    Benay Pike MD

## 2022-07-10 NOTE — Telephone Encounter (Signed)
Per Dr Rob Hickman directions: Called patient - he is aware that infusion for tomorrow is cancelled due to low neutrophil count.

## 2022-07-11 ENCOUNTER — Other Ambulatory Visit: Payer: Self-pay

## 2022-07-11 ENCOUNTER — Ambulatory Visit
Admission: RE | Admit: 2022-07-11 | Discharge: 2022-07-11 | Disposition: A | Discharge status: Home / Self Care | Payer: Medicare HMO | Source: Ambulatory Visit | Attending: Radiation Oncology | Admitting: Radiation Oncology

## 2022-07-11 ENCOUNTER — Inpatient Hospital Stay: Payer: Medicare HMO

## 2022-07-11 DIAGNOSIS — C321 Malignant neoplasm of supraglottis: Secondary | ICD-10-CM | POA: Diagnosis not present

## 2022-07-11 DIAGNOSIS — Z510 Encounter for antineoplastic radiation therapy: Secondary | ICD-10-CM | POA: Diagnosis not present

## 2022-07-11 LAB — RAD ONC ARIA SESSION SUMMARY
Course Elapsed Days: 42
Plan Fractions Treated to Date: 30
Plan Prescribed Dose Per Fraction: 2 Gy
Plan Total Fractions Prescribed: 35
Plan Total Prescribed Dose: 70 Gy
Reference Point Dosage Given to Date: 60 Gy
Reference Point Session Dosage Given: 2 Gy
Session Number: 30

## 2022-07-11 NOTE — Progress Notes (Unsigned)
Cardiology Office Note   Date:  07/12/2022   ID:  Cory [name] 1946-04-16, MRN [id number]  PCP:  [name], MD  Cardiologist:   [name], MD    Chief Complaint  Patient presents with   Atrial Fibrillation       History of Present Illness: Cory [name] is a 76 y.o. male who presents for evaluation of PAF.  He has had endovascular aortic graft.  He was seen prior to EGD earlier this year.  Since I last saw him he has been treated for supraglottic cancer.  He had radiation and cisplatin therapy.  He has had swallowing difficulties obviously and is at a G-tube.  He is able to eat now.  He is lost about 30 pounds.  He is not having any new chest pressure, neck or arm discomfort.  He is not having any new shortness of breath, PND or orthopnea.  Has had no weight gain or edema.  He has not felt any of his fibrillation.    Past Medical History:  Diagnosis Date   Aortic stenosis    a. Moderate by echo 05/2020.   Arthritis    right hip   Cancer ([place])    skin cancer nose, right left, upper left basel cell   Dysrhythmia    a-fib   PAF (paroxysmal atrial fibrillation) (HCC)    Peripheral arterial disease (HCC)    Tobacco abuse     Past Surgical History:  Procedure Laterality Date   ABDOMINAL AORTIC ENDOVASCULAR STENT GRAFT N/A 08/29/2020   Procedure: ABDOMINAL AORTIC ENDOVASCULAR STENT GRAFT;  Surgeon: Elam Dutch, MD;  Location: [place];  Service: Vascular;  Laterality: N/A;   ABDOMINAL AORTOGRAM W/LOWER EXTREMITY Bilateral 06/24/2020   Procedure: ABDOMINAL AORTOGRAM W/LOWER EXTREMITY;  Surgeon: Elam Dutch, MD;  Location: [place] [facility];  Service: Cardiovascular;  Laterality: Bilateral;   BACK SURGERY     2005   BIOPSY  05/21/2022   Procedure: BIOPSY;  Surgeon: Rush Landmark Telford Nab., MD;  Location: [facility];  Service: Gastroenterology;;   CERVICAL SPINE SURGERY     DIRECT LARYNGOSCOPY Bilateral 04/18/2022   Procedure: DIRECT  LARYNGOSCOPY WITH BIOPSIES;  Surgeon: Melida Quitter, MD;  Location: [place];  Service: ENT;  Laterality: Bilateral;   EMBOLECTOMY Right 08/29/2020   Procedure: POPLITEAL AND TIBIAL EMBOLECTOMY;  Surgeon: Elam Dutch, MD;  Location: [place];  Service: Vascular;  Laterality: Right;   EMBOLIZATION Right 06/24/2020   Procedure: EMBOLIZATION;  Surgeon: Elam Dutch, MD;  Location: [place] [facility];  Service: Cardiovascular;  Laterality: Right;   ENDARTERECTOMY FEMORAL Right 08/29/2020   Procedure: ENDARTERECTOMY FEMORAL;  Surgeon: Elam Dutch, MD;  Location: [facility];  Service: Vascular;  Laterality: Right;   ESOPHAGOGASTRODUODENOSCOPY N/A 06/27/2022   Procedure: ESOPHAGOGASTRODUODENOSCOPY (EGD);  Surgeon: Irving Copas., MD;  Location: [facility];  Service: Gastroenterology;  Laterality: N/A;   ESOPHAGOGASTRODUODENOSCOPY (EGD) WITH PROPOFOL N/A 05/21/2022   Procedure: ESOPHAGOGASTRODUODENOSCOPY (EGD) WITH PROPOFOL;  Surgeon: Rush Landmark Telford Nab., MD;  Location: [facility];  Service: Gastroenterology;  Laterality: N/A;   EUS N/A 05/21/2022   Procedure: UPPER ENDOSCOPIC ULTRASOUND (EUS) RADIAL;  Surgeon: Irving Copas., MD;  Location: [facility];  Service: Gastroenterology;  Laterality: N/A;   EUS N/A 06/27/2022   Procedure: UPPER ENDOSCOPIC ULTRASOUND (EUS) RADIAL;  Surgeon: Irving Copas., MD;  Location: [facility];  Service: Gastroenterology;  Laterality: N/A;   EYE SURGERY  FEMORAL ARTERY EXPLORATION Right 06/25/2020   Procedure: RIGHT GROIN EXPLORATION Evacuation of Hematoma  WITH REPAIR OF RIGHT Common FEMORAL ARTERY.;  Surgeon: Angelia Mould, MD;  Location: [place];  Service: Vascular;  Laterality: Right;   FEMORAL-POPLITEAL BYPASS GRAFT Left 11/22/2020   Procedure: LEFT ABOVE KNEE-BELOW KNEE BYPASS TIBIAL PERITONEAL TRUNK REVERSE IPSILATERAL GREATER SAPHENOUS VEIN;  Surgeon: Elam Dutch, MD;  Location: [facility];  Service: Vascular;   Laterality: Left;   FINE NEEDLE ASPIRATION N/A 05/21/2022   Procedure: FINE NEEDLE ASPIRATION (FNA) LINEAR;  Surgeon: Irving Copas., MD;  Location: [facility];  Service: Gastroenterology;  Laterality: N/A;   FINE NEEDLE ASPIRATION  06/27/2022   Procedure: FINE NEEDLE ASPIRATION;  Surgeon: Rush Landmark Telford Nab., MD;  Location: [facility];  Service: Gastroenterology;;   HOT HEMOSTASIS N/A 06/27/2022   Procedure: HOT HEMOSTASIS (ARGON PLASMA COAGULATION/BICAP);  Surgeon: Irving Copas., MD;  Location: [facility];  Service: Gastroenterology;  Laterality: N/A;   IR GASTROSTOMY TUBE MOD SED  06/12/2022   IR IMAGING GUIDED PORT INSERTION  06/12/2022   LEG SURGERY     Trauma   LOWER EXTREMITY ANGIOGRAPHY Left 11/17/2020   Procedure: Lower Extremity Angiography;  Surgeon: Marty Heck, MD;  Location: [place] [facility];  Service: Cardiovascular;  Laterality: Left;   PATCH ANGIOPLASTY Right 08/29/2020   Procedure: PATCH ANGIOPLASTY RIGHT BELOW KNEE POPLITEAL ARTERY AND RIGHT COMMON FEMORAL ARTERY;  Surgeon: Elam Dutch, MD;  Location: [place];  Service: Vascular;  Laterality: Right;   RETINAL DETACHMENT SURGERY     RIGID ESOPHAGOSCOPY  04/18/2022   Procedure: RIGID ESOPHAGOSCOPY;  Surgeon: Melida Quitter, MD;  Location: [place];  Service: ENT;;   TONSILLECTOMY  04/18/2022   Procedure: LEFT TONSILLECTOMY;  Surgeon: Melida Quitter, MD;  Location: [place];  Service: ENT;;   ULTRASOUND GUIDANCE FOR VASCULAR ACCESS Bilateral 08/29/2020   Procedure: ULTRASOUND GUIDANCE FOR VASCULAR ACCESS;  Surgeon: Elam Dutch, MD;  Location: [facility];  Service: Vascular;  Laterality: Bilateral;     Current Outpatient Medications  Medication Sig Dispense Refill   acetaminophen (TYLENOL) 500 MG tablet Take 500 mg by mouth every 6 (six) hours as needed (for pain.).     cetirizine (ZYRTEC) 10 MG tablet Take 1 tablet (10 mg total) by mouth daily. (Patient taking differently: Take 10 mg by mouth  daily as needed for allergies.) 30 tablet 11   cholecalciferol (VITAMIN D) 25 MCG (1000 UNIT) tablet Take 1,000 Units by mouth 2 (two) times daily.     dexamethasone (DECADRON) 4 MG tablet Take 2 tablets daily x 3 days starting the day after cisplatin chemotherapy. Take with food. 30 tablet 1   diltiazem (TIADYLT ER) 120 MG 24 hr capsule TAKE 1 CAPSULE EVERY DAY (SCHEDULE OFFICE VISIT FOR FUTURE REFILLS) 90 capsule 1   lidocaine (XYLOCAINE) 2 % solution Mix 1 part lidocaine with 1 part water, then swish and swallow 10 ml of mixture 30 minutes before meals and at bedtime up to 4 times daily as needed for sore throat 200 mL 0   metoprolol tartrate (LOPRESSOR) 25 MG tablet Take 1 tablet (25 mg total) by mouth 2 (two) times daily as needed. 30 tablet 1   nicotine (NICODERM CQ - DOSED IN MG/24 HOURS) 21 mg/24hr patch Place 1 patch (21 mg total) onto the skin daily. Apply 21 mg patch daily x 6 wk, then '14mg'$  patch daily x 2 wk, then 7 mg patch daily x 2 wk 14 patch 2  omeprazole (PRILOSEC) 40 MG capsule Take 1 capsule (40 mg total) by mouth daily. 30 capsule 6   rivaroxaban (XARELTO) 20 MG TABS tablet Take 1 tablet (20 mg total) by mouth daily with supper. 90 tablet 1   rosuvastatin (CRESTOR) 10 MG tablet Take 1 tablet (10 mg total) by mouth daily. 90 tablet 1   sodium chloride (OCEAN) 0.65 % SOLN nasal spray Place 1 spray into both nostrils as needed for congestion.     lidocaine (XYLOCAINE) 2 % solution Patient: Mix 1part 2% viscous lidocaine, 1part H20. Swish & swallow 55m of diluted mixture, 332m before meals  and at bedtime, up to QID prn sore throat. (Patient not taking: Reported on 06/22/2022) 200 mL 3   lidocaine-prilocaine (EMLA) cream Apply to affected area once (Patient not taking: Reported on 07/12/2022) 30 g 3   ondansetron (ZOFRAN) 8 MG tablet Take 1 tablet (8 mg total) by mouth every 8 (eight) hours as needed for nausea or vomiting. Start on the third day after cisplatin. (Patient not taking:  Reported on 07/12/2022) 30 tablet 1   prochlorperazine (COMPAZINE) 10 MG tablet Take 1 tablet (10 mg total) by mouth every 6 (six) hours as needed (Nausea or vomiting). (Patient not taking: Reported on 07/12/2022) 30 tablet 1   No current facility-administered medications for this visit.    Allergies:   Zithromax [azithromycin]    ROS:  Please see the history of present illness.   Otherwise, review of systems are positive for none.   All other systems are reviewed and negative.    PHYSICAL EXAM: VS:  BP 100/70   Pulse 84   Ht '6\' 4"'$  (1.93 m)   Wt 213 lb 12.8 oz (97 kg)   SpO2 94%   BMI 26.02 kg/m  , BMI Body mass index is 26.02 kg/m.  GENERAL:  Well appearing NECK:  No jugular venous distention, waveform within normal limits, carotid upstroke brisk and symmetric, no bruits, no thyromegaly LUNGS:  Clear to auscultation bilaterally CHEST:  Unremarkable HEART:  PMI not displaced or sustained,S1 and S2 within normal limits, no S3, no S4, no clicks, no rubs, 3 out of 6 apical systolic murmur radiating slightly at aortic outflow tract, no diastolic murmurs ABD:  Flat, positive bowel sounds normal in frequency in pitch, no bruits, no rebound, no guarding, no midline pulsatile mass, no hepatomegaly, no splenomegaly, G-tube EXT:  2 plus pulses throughout, no edema, no cyanosis no clubbing, bilateral femoral bruits  EKG:  EKG is  not ordered today.  Recent Labs: 05/08/2022: TSH 0.798 07/10/2022: ALT 14; BUN 12; Creatinine, Ser 0.71; Hemoglobin 11.5; Magnesium 2.0; Platelets 133; Potassium 3.9; Sodium 136    Lipid Panel    Component Value Date/Time   CHOL 115 01/09/2022 0934   TRIG 65.0 01/09/2022 0934   HDL 58.00 01/09/2022 0934   CHOLHDL 2 01/09/2022 0934   VLDL 13.0 01/09/2022 0934   LDLCALC 44 01/09/2022 0934      Wt Readings from Last 3 Encounters:  07/12/22 213 lb 12.8 oz (97 kg)  07/10/22 215 lb 11.2 oz (97.8 kg)  07/03/22 212 lb 11.2 oz (96.5 kg)      Other studies  Reviewed: Additional studies/ records that were reviewed today include:   Oncology records Review of the above records demonstrates:  See below   ASSESSMENT AND PLAN:  ATRIAL FIB:      He's having very few paroxysms. Mr. JaJERET GOYERas a CHA2DS2 - VASc score of 3.  No  change in therapy.  He tolerates anticoagulation.  TOBACCO: He is wearing a patch and says he is doing good with that.  PVD:   He has follow-up with [facility].     He is status post left popliteal artery bypass.    HTN:   BP is at target.  No change in therapy.   AS:    This was moderate in October 2023.  I will follow this with echocardiography next year.   DYSLIPIDEMIA:   LDL was 44 with an HDL of 58.  No change in therapy.    Current medicines are reviewed at length with the patient today.  The patient does not have concerns regarding medicines.  The following changes have been made: None.  Labs/ tests ordered today include:  None  No orders of the defined types were placed in this encounter.     Disposition:   FU with me in 12 months.     Echo in October.   Signed, [name], MD  07/12/2022 10:32 AM    [facility]

## 2022-07-12 ENCOUNTER — Other Ambulatory Visit: Payer: Self-pay

## 2022-07-12 ENCOUNTER — Ambulatory Visit: Payer: Medicare HMO | Attending: Cardiology | Admitting: Cardiology

## 2022-07-12 ENCOUNTER — Ambulatory Visit
Admission: RE | Admit: 2022-07-12 | Discharge: 2022-07-12 | Disposition: A | Discharge status: Home / Self Care | Payer: Medicare HMO | Source: Ambulatory Visit | Attending: Radiation Oncology | Admitting: Radiation Oncology

## 2022-07-12 ENCOUNTER — Encounter: Payer: Self-pay | Admitting: Cardiology

## 2022-07-12 VITALS — BP 100/70 | HR 84 | Ht 76.0 in | Wt 213.8 lb

## 2022-07-12 DIAGNOSIS — I10 Essential (primary) hypertension: Secondary | ICD-10-CM

## 2022-07-12 DIAGNOSIS — Z510 Encounter for antineoplastic radiation therapy: Secondary | ICD-10-CM | POA: Diagnosis not present

## 2022-07-12 DIAGNOSIS — Z720 Tobacco use: Secondary | ICD-10-CM | POA: Diagnosis not present

## 2022-07-12 DIAGNOSIS — I350 Nonrheumatic aortic (valve) stenosis: Secondary | ICD-10-CM

## 2022-07-12 DIAGNOSIS — I480 Paroxysmal atrial fibrillation: Secondary | ICD-10-CM

## 2022-07-12 DIAGNOSIS — C321 Malignant neoplasm of supraglottis: Secondary | ICD-10-CM | POA: Diagnosis not present

## 2022-07-12 DIAGNOSIS — E785 Hyperlipidemia, unspecified: Secondary | ICD-10-CM

## 2022-07-12 LAB — RAD ONC ARIA SESSION SUMMARY
Course Elapsed Days: 43
Plan Fractions Treated to Date: 31
Plan Prescribed Dose Per Fraction: 2 Gy
Plan Total Fractions Prescribed: 35
Plan Total Prescribed Dose: 70 Gy
Reference Point Dosage Given to Date: 62 Gy
Reference Point Session Dosage Given: 2 Gy
Session Number: 31

## 2022-07-12 NOTE — Addendum Note (Signed)
Addended by: Linton Ham on: 07/12/2022 10:48 AM   Modules accepted: Orders

## 2022-07-12 NOTE — Patient Instructions (Addendum)
Medication Instructions:  Your physician recommends that you continue on your current medications as directed. Please refer to the Current Medication list given to you today.  *If you need a refill on your cardiac medications before your next appointment, please call your pharmacy*  Mailed patient Xarelto forms to fill out and bring back to the office.   Lab Work: NONE If you have labs (blood work) drawn today and your tests are completely normal, you will receive your results only by: [place] (if you have MyChart) OR A paper copy in the mail If you have any lab test that is abnormal or we need to change your treatment, we will call you to review the results.   Testing/Procedures: Your physician has requested that you have an echocardiogram. (Please schedule for October of 2024)  Echocardiography is a painless test that uses sound waves to create images of your heart. It provides your doctor with information about the size and shape of your heart and how well your heart's chambers and valves are working. This procedure takes approximately one hour. There are no restrictions for this procedure. Please do NOT wear cologne, perfume, aftershave, or lotions (deodorant is allowed). Please arrive 15 minutes prior to your appointment time.    Follow-Up: At [facility], you and your health needs are our priority.  As part of our continuing mission to provide you with exceptional heart care, we have created designated Provider Care Teams.  These Care Teams include your primary Cardiologist (physician) and Advanced Practice Providers (APPs -  Physician Assistants and Nurse Practitioners) who all work together to provide you with the care you need, when you need it.  We recommend signing up for the patient portal called "MyChart".  Sign up information is provided on this After Visit Summary.  MyChart is used to connect with patients for Virtual Visits (Telemedicine).  Patients are able  to view lab/test results, encounter notes, upcoming appointments, etc.  Non-urgent messages can be sent to your provider as well.   To learn more about what you can do with MyChart, go to [email or web address].    Your next appointment:   1 year(s)  The format for your next appointment:   In Person  Provider:   [name], MD

## 2022-07-13 ENCOUNTER — Other Ambulatory Visit: Payer: Self-pay

## 2022-07-13 ENCOUNTER — Ambulatory Visit
Admission: RE | Admit: 2022-07-13 | Discharge: 2022-07-13 | Disposition: A | Discharge status: Home / Self Care | Payer: Medicare HMO | Source: Ambulatory Visit | Attending: Radiation Oncology | Admitting: Radiation Oncology

## 2022-07-13 DIAGNOSIS — Z510 Encounter for antineoplastic radiation therapy: Secondary | ICD-10-CM | POA: Diagnosis not present

## 2022-07-13 DIAGNOSIS — C321 Malignant neoplasm of supraglottis: Secondary | ICD-10-CM | POA: Diagnosis not present

## 2022-07-13 LAB — RAD ONC ARIA SESSION SUMMARY
Course Elapsed Days: 44
Plan Fractions Treated to Date: 32
Plan Prescribed Dose Per Fraction: 2 Gy
Plan Total Fractions Prescribed: 35
Plan Total Prescribed Dose: 70 Gy
Reference Point Dosage Given to Date: 64 Gy
Reference Point Session Dosage Given: 2 Gy
Session Number: 32

## 2022-07-16 ENCOUNTER — Ambulatory Visit
Admission: RE | Admit: 2022-07-16 | Discharge: 2022-07-16 | Disposition: A | Discharge status: Home / Self Care | Payer: Medicare HMO | Source: Ambulatory Visit | Attending: Radiation Oncology | Admitting: Radiation Oncology

## 2022-07-16 ENCOUNTER — Other Ambulatory Visit: Payer: Self-pay

## 2022-07-16 DIAGNOSIS — C321 Malignant neoplasm of supraglottis: Secondary | ICD-10-CM

## 2022-07-16 DIAGNOSIS — Z510 Encounter for antineoplastic radiation therapy: Secondary | ICD-10-CM | POA: Diagnosis not present

## 2022-07-16 LAB — RAD ONC ARIA SESSION SUMMARY
Course Elapsed Days: 47
Plan Fractions Treated to Date: 33
Plan Prescribed Dose Per Fraction: 2 Gy
Plan Total Fractions Prescribed: 35
Plan Total Prescribed Dose: 70 Gy
Reference Point Dosage Given to Date: 66 Gy
Reference Point Session Dosage Given: 2 Gy
Session Number: 33

## 2022-07-16 MED ORDER — SONAFINE EX EMUL
1.0000 | Freq: Two times a day (BID) | CUTANEOUS | Status: DC
  Administered 2022-07-16: 1 via TOPICAL

## 2022-07-17 ENCOUNTER — Inpatient Hospital Stay (HOSPITAL_BASED_OUTPATIENT_CLINIC_OR_DEPARTMENT_OTHER): Payer: Medicare HMO | Admitting: Hematology and Oncology

## 2022-07-17 ENCOUNTER — Ambulatory Visit
Admission: RE | Admit: 2022-07-17 | Discharge: 2022-07-17 | Disposition: A | Discharge status: Home / Self Care | Payer: Medicare HMO | Source: Ambulatory Visit | Attending: Radiation Oncology | Admitting: Radiation Oncology

## 2022-07-17 ENCOUNTER — Other Ambulatory Visit: Payer: Self-pay

## 2022-07-17 ENCOUNTER — Inpatient Hospital Stay: Payer: Medicare HMO

## 2022-07-17 ENCOUNTER — Telehealth: Payer: Self-pay | Admitting: Hematology and Oncology

## 2022-07-17 VITALS — BP 115/72 | HR 74 | Temp 97.9°F | Resp 16 | Ht 76.0 in | Wt 214.4 lb

## 2022-07-17 DIAGNOSIS — K1231 Oral mucositis (ulcerative) due to antineoplastic therapy: Secondary | ICD-10-CM

## 2022-07-17 DIAGNOSIS — Z95828 Presence of other vascular implants and grafts: Secondary | ICD-10-CM

## 2022-07-17 DIAGNOSIS — C321 Malignant neoplasm of supraglottis: Secondary | ICD-10-CM

## 2022-07-17 LAB — COMPREHENSIVE METABOLIC PANEL
ALT: 11 U/L (ref 0–44)
AST: 9 U/L — ABNORMAL LOW (ref 15–41)
Albumin: 3.6 g/dL (ref 3.5–5.0)
Alkaline Phosphatase: 46 U/L (ref 38–126)
Anion gap: 4 — ABNORMAL LOW (ref 5–15)
BUN: 11 mg/dL (ref 8–23)
CO2: 31 mmol/L (ref 22–32)
Calcium: 9.2 mg/dL (ref 8.9–10.3)
Chloride: 103 mmol/L (ref 98–111)
Creatinine, Ser: 0.78 mg/dL (ref 0.61–1.24)
GFR, Estimated: >60 mL/min (ref >60)
Glucose, Bld: 105 mg/dL — ABNORMAL HIGH (ref 70–99)
Potassium: 3.8 mmol/L (ref 3.5–5.1)
Sodium: 138 mmol/L (ref 135–145)
Total Bilirubin: 0.3 mg/dL (ref 0.3–1.2)
Total Protein: 5.8 g/dL — ABNORMAL LOW (ref 6.5–8.1)

## 2022-07-17 LAB — CBC WITH DIFFERENTIAL/PLATELET
Abs Immature Granulocytes: 0.02 10*3/uL (ref 0.00–0.07)
Basophils Absolute: 0 10*3/uL (ref 0.0–0.1)
Basophils Relative: 1 %
Eosinophils Absolute: 0 10*3/uL (ref 0.0–0.5)
Eosinophils Relative: 2 %
HCT: 33.4 % — ABNORMAL LOW (ref 39.0–52.0)
Hemoglobin: 11.6 g/dL — ABNORMAL LOW (ref 13.0–17.0)
Immature Granulocytes: 1 %
Lymphocytes Relative: 19 %
Lymphs Abs: 0.4 10*3/uL — ABNORMAL LOW (ref 0.7–4.0)
MCH: 33.6 pg (ref 26.0–34.0)
MCHC: 34.7 g/dL (ref 30.0–36.0)
MCV: 96.8 fL (ref 80.0–100.0)
Monocytes Absolute: 0.5 10*3/uL (ref 0.1–1.0)
Monocytes Relative: 27 %
Neutro Abs: 1 10*3/uL — ABNORMAL LOW (ref 1.7–7.7)
Neutrophils Relative %: 50 %
Platelets: 202 10*3/uL (ref 150–400)
RBC: 3.45 MIL/uL — ABNORMAL LOW (ref 4.22–5.81)
RDW: 15.2 % (ref 11.5–15.5)
WBC: 2 10*3/uL — ABNORMAL LOW (ref 4.0–10.5)
nRBC: 0 % (ref 0.0–0.2)

## 2022-07-17 LAB — RAD ONC ARIA SESSION SUMMARY
Course Elapsed Days: 48
Plan Fractions Treated to Date: 34
Plan Prescribed Dose Per Fraction: 2 Gy
Plan Total Fractions Prescribed: 35
Plan Total Prescribed Dose: 70 Gy
Reference Point Dosage Given to Date: 68 Gy
Reference Point Session Dosage Given: 2 Gy
Session Number: 34

## 2022-07-17 MED ORDER — HEPARIN SOD (PORK) LOCK FLUSH 100 UNIT/ML IV SOLN
500.0000 [IU] | Freq: Once | INTRAVENOUS | Status: AC
  Administered 2022-07-17: 500 [IU]

## 2022-07-17 MED ORDER — SODIUM CHLORIDE 0.9% FLUSH
10.0000 mL | Freq: Once | INTRAVENOUS | Status: AC
  Administered 2022-07-17: 10 mL

## 2022-07-17 MED FILL — Dexamethasone Sodium Phosphate Inj 100 MG/10ML: INTRAMUSCULAR | Qty: 1 | Status: AC

## 2022-07-17 MED FILL — Fosaprepitant Dimeglumine For IV Infusion 150 MG (Base Eq): INTRAVENOUS | Qty: 5 | Status: AC

## 2022-07-17 NOTE — Assessment & Plan Note (Signed)
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   Further investigation of the aortocaval lymph node showed atypical lymphocytes suggestive of lymphoproliferative disorder.  However upon further review, it appears that the lymph node that was biopsied is not the lymph node that was desired to be biopsied.although this was abnormal.  We have requested a second biopsy for better characterization of the diagnosis. This was inconclusive.  He only received 4 cycles of chemo so far, 5 th cycle was delayed for almost 2 weeks. He is feeling pretty well today, labs with ANC of 1000 and total WBC of 2000. No other cytopenias except very mild anemia. We will plan to proceed with chemo tomorrow, his last treatment of radiation is 12/13.  With regards to abnormal aortocaval LN, we will repeat EOT PET and follow on this. He is clinically symptomatic.

## 2022-07-17 NOTE — Telephone Encounter (Signed)
Scheduled appointment per 12/12 los. Patient is aware.

## 2022-07-17 NOTE — Assessment & Plan Note (Signed)
Mild, currently taking tylenol and viscous lidocaine

## 2022-07-17 NOTE — Progress Notes (Signed)
[place] NOTE  Patient Care Team: [name], MD as PCP - General [name], MD as PCP - Cardiology (Cardiology) Viona Gilmore, [facility] as Pharmacist (Pharmacist) [place]elt, Stephani Police, RN as Oncology Nurse Navigator [name], MD as Consulting Physician (Radiation Oncology) Benay Pike, MD as Consulting Physician (Hematology and Oncology)  CHIEF COMPLAINTS/PURPOSE OF CONSULTATION:  SCC supraglottis  ONCOLOGIC HISTORY: Initially presented to ENT with chronic left-sided sore throat for almost a year.  Initial evaluation did not show any possible causes for sore throat.  He was recommended PPI twice daily but went back to ENT with persistent sore throat he once again had a repeat fiberoptic exam which was stable.   CT neck done on November 08, 2021 did show subcentimeter focus of mucosal hyperenhancement along the posterior aspect of the uvula  laryngoscopy with direct visualization recommended to exclude a mucosal lesion at the site.  Additionally few tiny cystic-appearing foci along the left glossotonsillar sulcus.  Direct visualization recommended.  Nonspecific mildly enlarged right level 2 lymph node.  Multiple small calcific foci in the left palatine tonsil.Repeat fiberoptic exam was unremarkable hence Dr. 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  Tongue biopsy showed benign squamous mucosa with low to intermediate grade dysplasia. 05/07/2022: PET/CT showed asymmetric hypermetabolism in the left oropharynx and involving the left epiglottis.  Contralateral hypermetabolic level 2 cervical lymph nodes compatible with metastatic disease.  No hypermetabolic adenopathy in the left neck or in the chest.  1.2 cm short axis aortocaval lymph node in the upper abdomen is  hypermetabolic metastatic disease is a concern.  This lymph node has increased from 0.7 cm on the study from 2022.  No other metastatic disease. Started radiation on 05/30/2022 and weekly cisplatin on 06/01/2022.   INTERIM HISTORY:  Cory [name] returns for a toxicity check before his Cycle 5 weekly cisplatin infusion scheduled for tomorrow.  He couldn't get chemo for the past 2 weeks. He has only been taking tylenol 500 mg PO BID and viscous lidocaine for sore throat. He is still eating some food, he can't taste nothing but swallows everything fine. Since last visit, he has been using G tube, stable weight He will be completing radiation in 2 days, last chemo anticipated tomorrow. No change in breathing, No change in bowel habits or urinary habits.  Rest of the pertinent 10 point ROS reviewed and negative  MEDICAL HISTORY:  Past Medical History:  Diagnosis Date   Aortic stenosis    a. Moderate by echo 05/2020.   Arthritis    right hip   Cancer ([place])    skin cancer nose, right left, upper left basel cell   Dysrhythmia    a-fib   PAF (paroxysmal atrial fibrillation) (HCC)    Peripheral arterial disease (HCC)    Tobacco abuse     SURGICAL HISTORY: Past Surgical History:  Procedure Laterality Date   ABDOMINAL AORTIC ENDOVASCULAR STENT GRAFT N/A 08/29/2020   Procedure: ABDOMINAL AORTIC ENDOVASCULAR STENT GRAFT;  Surgeon: Elam Dutch, MD;  Location: [place];  Service: Vascular;  Laterality: N/A;   ABDOMINAL AORTOGRAM W/LOWER EXTREMITY Bilateral 06/24/2020   Procedure: ABDOMINAL AORTOGRAM W/LOWER EXTREMITY;  Surgeon: Elam Dutch, MD;  Location: [place] [facility];  Service: Cardiovascular;  Laterality: Bilateral;   BACK SURGERY     2005   BIOPSY  05/21/2022  Procedure: BIOPSY;  Surgeon: Irving Copas., MD;  Location: [facility];  Service: Gastroenterology;;   CERVICAL SPINE SURGERY     DIRECT LARYNGOSCOPY Bilateral 04/18/2022   Procedure: DIRECT LARYNGOSCOPY  WITH BIOPSIES;  Surgeon: Melida Quitter, MD;  Location: [place];  Service: ENT;  Laterality: Bilateral;   EMBOLECTOMY Right 08/29/2020   Procedure: POPLITEAL AND TIBIAL EMBOLECTOMY;  Surgeon: Elam Dutch, MD;  Location: [place];  Service: Vascular;  Laterality: Right;   EMBOLIZATION Right 06/24/2020   Procedure: EMBOLIZATION;  Surgeon: Elam Dutch, MD;  Location: [place] [facility];  Service: Cardiovascular;  Laterality: Right;   ENDARTERECTOMY FEMORAL Right 08/29/2020   Procedure: ENDARTERECTOMY FEMORAL;  Surgeon: Elam Dutch, MD;  Location: [facility];  Service: Vascular;  Laterality: Right;   ESOPHAGOGASTRODUODENOSCOPY N/A 06/27/2022   Procedure: ESOPHAGOGASTRODUODENOSCOPY (EGD);  Surgeon: Irving Copas., MD;  Location: [facility];  Service: Gastroenterology;  Laterality: N/A;   ESOPHAGOGASTRODUODENOSCOPY (EGD) WITH PROPOFOL N/A 05/21/2022   Procedure: ESOPHAGOGASTRODUODENOSCOPY (EGD) WITH PROPOFOL;  Surgeon: Rush Landmark Telford Nab., MD;  Location: [facility];  Service: Gastroenterology;  Laterality: N/A;   EUS N/A 05/21/2022   Procedure: UPPER ENDOSCOPIC ULTRASOUND (EUS) RADIAL;  Surgeon: Irving Copas., MD;  Location: [facility];  Service: Gastroenterology;  Laterality: N/A;   EUS N/A 06/27/2022   Procedure: UPPER ENDOSCOPIC ULTRASOUND (EUS) RADIAL;  Surgeon: Irving Copas., MD;  Location: [facility];  Service: Gastroenterology;  Laterality: N/A;   EYE SURGERY     FEMORAL ARTERY EXPLORATION Right 06/25/2020   Procedure: RIGHT GROIN EXPLORATION Evacuation of Hematoma  WITH REPAIR OF RIGHT Common FEMORAL ARTERY.;  Surgeon: Angelia Mould, MD;  Location: [place];  Service: Vascular;  Laterality: Right;   FEMORAL-POPLITEAL BYPASS GRAFT Left 11/22/2020   Procedure: LEFT ABOVE KNEE-BELOW KNEE BYPASS TIBIAL PERITONEAL TRUNK REVERSE IPSILATERAL GREATER SAPHENOUS VEIN;  Surgeon: Elam Dutch, MD;  Location[facility];  Service: Vascular;  Laterality:  Left;   FINE NEEDLE ASPIRATION N/A 05/21/2022   Procedure: FINE NEEDLE ASPIRATION (FNA) LINEAR;  Surgeon: Irving Copas., MD;  Location: [facility];  Service: Gastroenterology;  Laterality: N/A;   FINE NEEDLE ASPIRATION  06/27/2022   Procedure: FINE NEEDLE ASPIRATION;  Surgeon: Rush Landmark Telford Nab., MD;  Location: [facility];  Service: Gastroenterology;;   HOT HEMOSTASIS N/A 06/27/2022   Procedure: HOT HEMOSTASIS (ARGON PLASMA COAGULATION/BICAP);  Surgeon: Irving Copas., MD;  Location: [facility];  Service: Gastroenterology;  Laterality: N/A;   IR GASTROSTOMY TUBE MOD SED  06/12/2022   IR IMAGING GUIDED [name] INSERTION  06/12/2022   LEG SURGERY     Trauma   LOWER EXTREMITY ANGIOGRAPHY Left 11/17/2020   Procedure: Lower Extremity Angiography;  Surgeon: Marty Heck, MD;  Location: [place] [facility];  Service: Cardiovascular;  Laterality: Left;   PATCH ANGIOPLASTY Right 08/29/2020   Procedure: PATCH ANGIOPLASTY RIGHT BELOW KNEE POPLITEAL ARTERY AND RIGHT COMMON FEMORAL ARTERY;  Surgeon: Elam Dutch, MD;  Location: [place];  Service: Vascular;  Laterality: Right;   RETINAL DETACHMENT SURGERY     RIGID ESOPHAGOSCOPY  04/18/2022   Procedure: RIGID ESOPHAGOSCOPY;  Surgeon: Melida Quitter, MD;  Location: [place];  Service: ENT;;   TONSILLECTOMY  04/18/2022   Procedure: LEFT TONSILLECTOMY;  Surgeon: Melida Quitter, MD;  Location: [place];  Service: ENT;;   ULTRASOUND GUIDANCE FOR VASCULAR ACCESS Bilateral 08/29/2020   Procedure: ULTRASOUND GUIDANCE FOR VASCULAR ACCESS;  Surgeon: Elam Dutch, MD;  Location: [place];  Service: Vascular;  Laterality:  Bilateral;    SOCIAL HISTORY: Social History   Socioeconomic History   Marital status: Married    Spouse name: Not on file   Number of children: 5   Years of education: Not on file   Highest education level: Master's degree (e.g., MA, MS, MEng, MEd, MSW, MBA)  Occupational History   Not on file  Tobacco Use    Smoking status: Every Day    Packs/day: 0.50    Years: 50.00    Total pack years: 25.00    Types: Cigarettes    Passive exposure: Never   Smokeless tobacco: Never  Vaping Use   Vaping Use: Never used  Substance and Sexual Activity   Alcohol use: Yes    Alcohol/week: 0.0 standard drinks of alcohol    Comment: 4 to 5 times a week; beer, wine and liquor    Drug use: No   Sexual activity: Not Currently  Other Topics Concern   Not on file  Social History Narrative   Scores reading tests.  Lives with wife.     Social Determinants of Health   Financial Resource Strain: Low Risk  (05/24/2022)   Overall Financial Resource Strain (CARDIA)    Difficulty of Paying Living Expenses: Not hard at all  Food Insecurity: No Food Insecurity (05/24/2022)   Hunger Vital Sign    Worried About Running Out of Food in the Last Year: Never true    Ran Out of Food in the Last Year: Never true  Transportation Needs: No Transportation Needs (05/24/2022)   PRAPARE - Hydrologist (Medical): No    Lack of Transportation (Non-Medical): No  Physical Activity: Insufficiently Active (01/08/2022)   Exercise Vital Sign    Days of Exercise per Week: 3 days    Minutes of Exercise per Session: 30 min  Stress: No Stress Concern Present (01/08/2022)   [place]    Feeling of Stress : Not at all  Social Connections: Moderately Isolated (01/08/2022)   Social Connection and Isolation Panel [NHANES]    Frequency of Communication with Friends and Family: More than three times a week    Frequency of Social Gatherings with Friends and Family: Three times a week    Attends Religious Services: Never    Active Member of Clubs or Organizations: No    Attends Archivist Meetings: Not on file    Marital Status: Married  Human resources officer Violence: Not At Risk (06/07/2021)   Humiliation, Afraid, Rape, and Kick questionnaire     Fear of Current or Ex-Partner: No    Emotionally Abused: No    Physically Abused: No    Sexually Abused: No    FAMILY HISTORY: Family History  Problem Relation Age of Onset   Ovarian cancer Mother    Alcoholism Father    Breast cancer Sister    Ovarian cancer Sister     ALLERGIES:  is allergic to zithromax [azithromycin].  MEDICATIONS:  Current Outpatient Medications  Medication Sig Dispense Refill   acetaminophen (TYLENOL) 500 MG tablet Take 500 mg by mouth every 6 (six) hours as needed (for pain.).     cetirizine (ZYRTEC) 10 MG tablet Take 1 tablet (10 mg total) by mouth daily. (Patient taking differently: Take 10 mg by mouth daily as needed for allergies.) 30 tablet 11   cholecalciferol (VITAMIN D) 25 MCG (1000 UNIT) tablet Take 1,000 Units by mouth 2 (two) times daily.  dexamethasone (DECADRON) 4 MG tablet Take 2 tablets daily x 3 days starting the day after cisplatin chemotherapy. Take with food. 30 tablet 1   diltiazem (TIADYLT ER) 120 MG 24 hr capsule TAKE 1 CAPSULE EVERY DAY (SCHEDULE OFFICE VISIT FOR FUTURE REFILLS) 90 capsule 1   lidocaine (XYLOCAINE) 2 % solution Patient: Mix 1part 2% viscous lidocaine, 1part H20. Swish & swallow 34m of diluted mixture, 335m before meals  and at bedtime, up to QID prn sore throat. (Patient not taking: Reported on 06/22/2022) 200 mL 3   lidocaine (XYLOCAINE) 2 % solution Mix 1 part lidocaine with 1 part water, then swish and swallow 10 ml of mixture 30 minutes before meals and at bedtime up to 4 times daily as needed for sore throat 200 mL 0   lidocaine-prilocaine (EMLA) cream Apply to affected area once (Patient not taking: Reported on 07/12/2022) 30 g 3   metoprolol tartrate (LOPRESSOR) 25 MG tablet Take 1 tablet (25 mg total) by mouth 2 (two) times daily as needed. 30 tablet 1   nicotine (NICODERM CQ - DOSED IN MG/24 HOURS) 21 mg/24hr patch Place 1 patch (21 mg total) onto the skin daily. Apply 21 mg patch daily x 6 wk, then '14mg'$  patch  daily x 2 wk, then 7 mg patch daily x 2 wk 14 patch 2   omeprazole (PRILOSEC) 40 MG capsule Take 1 capsule (40 mg total) by mouth daily. 30 capsule 6   ondansetron (ZOFRAN) 8 MG tablet Take 1 tablet (8 mg total) by mouth every 8 (eight) hours as needed for nausea or vomiting. Start on the third day after cisplatin. (Patient not taking: Reported on 07/12/2022) 30 tablet 1   prochlorperazine (COMPAZINE) 10 MG tablet Take 1 tablet (10 mg total) by mouth every 6 (six) hours as needed (Nausea or vomiting). (Patient not taking: Reported on 07/12/2022) 30 tablet 1   rivaroxaban (XARELTO) 20 MG TABS tablet Take 1 tablet (20 mg total) by mouth daily with supper. 90 tablet 1   rosuvastatin (CRESTOR) 10 MG tablet Take 1 tablet (10 mg total) by mouth daily. 90 tablet 1   sodium chloride (OCEAN) 0.65 % SOLN nasal spray Place 1 spray into both nostrils as needed for congestion.     No current facility-administered medications for this visit.     PHYSICAL EXAMINATION: ECOG PERFORMANCE STATUS: 1 - Symptomatic but completely ambulatory  Vitals:   07/17/22 0901  BP: 115/72  Pulse: 74  Resp: 16  Temp: 97.9 F (36.6 C)  SpO2: 100%   Filed Weights   07/17/22 0901  Weight: 214 lb 6.4 oz (97.3 kg)    Physical Exam Constitutional:      Appearance: Normal appearance.  Cardiovascular:     Rate and Rhythm: Normal rate and regular rhythm.  Pulmonary:     Effort: Pulmonary effort is normal.     Breath sounds: Normal breath sounds.  Abdominal:     General: Abdomen is flat.     Palpations: Abdomen is soft.     Comments: G tube site appears well   Musculoskeletal:        General: No swelling or tenderness. Normal range of motion.     Cervical back: Normal range of motion and neck supple. No rigidity.  Lymphadenopathy:     Cervical: No cervical adenopathy.  Skin:    General: Skin is warm and dry.  Neurological:     General: No focal deficit present.     Mental Status: He is alert.  LABORATORY DATA:  I have reviewed the data as listed Lab Results  Component Value Date   WBC 2.0 (L) 07/17/2022   HGB 11.6 (L) 07/17/2022   HCT 33.4 (L) 07/17/2022   MCV 96.8 07/17/2022   PLT 202 07/17/2022     Chemistry      Component Value Date/Time   NA 136 07/10/2022 0925   NA 141 05/16/2018 1425   K 3.9 07/10/2022 0925   CL 102 07/10/2022 0925   CO2 31 07/10/2022 0925   BUN 12 07/10/2022 0925   BUN 13 05/16/2018 1425   CREATININE 0.71 07/10/2022 0925   CREATININE 0.84 07/03/2022 0812      Component Value Date/Time   CALCIUM 9.3 07/10/2022 0925   ALKPHOS 49 07/10/2022 0925   AST 10 (L) 07/10/2022 0925   ALT 14 07/10/2022 0925   BILITOT 0.3 07/10/2022 0925       RADIOGRAPHIC STUDIES: I have personally reviewed the radiological images as listed and agreed with the findings in the report. No results found.  ASSESSMENT AND PLAN:  Cory [name] is a 76 y.o. male who presents to the clinic for a follow up for SCC of supraglottis.  Malignant neoplasm of supraglottis [facility]) This is a very pleasant 76 year old male patient with history of smoking now 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   Further investigation of the aortocaval lymph node showed atypical lymphocytes suggestive of lymphoproliferative disorder.  However upon further review, it appears that the lymph node that was biopsied is not the lymph node that was desired to be biopsied.although this was abnormal.  We have requested a second biopsy for better characterization of the diagnosis. This was inconclusive.  He only received 4 cycles of chemo so far, 5 th cycle was delayed for almost 2 weeks. He is feeling pretty well today, labs with ANC of 1000 and total WBC of 2000. No other cytopenias except  very mild anemia. We will plan to proceed with chemo tomorrow, his last treatment of radiation is 12/13.  With regards to abnormal aortocaval LN, we will repeat EOT PET and follow on this. He is clinically symptomatic.  Mucositis due to chemotherapy Mild, currently taking tylenol and viscous lidocaine     All questions were answered. The patient knows to call the clinic with any problems, questions or concerns.  I have spent a total of 30 minutes minutes of face-to-face and non-face-to-face time, preparing to see the patient, performing a medically appropriate examination, counseling and educating the patient, documenting clinical information in the electronic health record, and care coordination.    Benay Pike MD

## 2022-07-18 ENCOUNTER — Other Ambulatory Visit: Payer: Self-pay | Admitting: Hematology and Oncology

## 2022-07-18 ENCOUNTER — Ambulatory Visit: Payer: Medicare HMO

## 2022-07-18 ENCOUNTER — Other Ambulatory Visit: Payer: Self-pay

## 2022-07-18 ENCOUNTER — Ambulatory Visit
Admission: RE | Admit: 2022-07-18 | Discharge: 2022-07-18 | Disposition: A | Discharge status: Home / Self Care | Payer: Medicare HMO | Source: Ambulatory Visit | Attending: Radiation Oncology | Admitting: Radiation Oncology

## 2022-07-18 ENCOUNTER — Inpatient Hospital Stay: Payer: Medicare HMO

## 2022-07-18 ENCOUNTER — Inpatient Hospital Stay: Payer: Medicare HMO | Admitting: Dietician

## 2022-07-18 VITALS — BP 136/75 | HR 89 | Temp 97.7°F | Resp 16

## 2022-07-18 DIAGNOSIS — C321 Malignant neoplasm of supraglottis: Secondary | ICD-10-CM | POA: Diagnosis not present

## 2022-07-18 LAB — RAD ONC ARIA SESSION SUMMARY
Course Elapsed Days: 49
Plan Fractions Treated to Date: 35
Plan Prescribed Dose Per Fraction: 2 Gy
Plan Total Fractions Prescribed: 35
Plan Total Prescribed Dose: 70 Gy
Reference Point Dosage Given to Date: 70 Gy
Reference Point Session Dosage Given: 2 Gy
Session Number: 35

## 2022-07-18 LAB — MAGNESIUM: Magnesium: 1.9 mg/dL (ref 1.7–2.4)

## 2022-07-18 MED ORDER — POTASSIUM CHLORIDE IN NACL 20-0.9 MEQ/L-% IV SOLN
Freq: Once | INTRAVENOUS | Status: AC
  Filled 2022-07-18: qty 1000

## 2022-07-18 MED ORDER — SODIUM CHLORIDE 0.9 % IV SOLN
40.0000 mg/m2 | Freq: Once | INTRAVENOUS | Status: AC
  Administered 2022-07-18: 91 mg via INTRAVENOUS
  Filled 2022-07-18: qty 91

## 2022-07-18 MED ORDER — PALONOSETRON HCL INJECTION 0.25 MG/5ML
0.2500 mg | Freq: Once | INTRAVENOUS | Status: AC
  Administered 2022-07-18: 0.25 mg via INTRAVENOUS
  Filled 2022-07-18: qty 5

## 2022-07-18 MED ORDER — SODIUM CHLORIDE 0.9 % IV SOLN: Freq: Once | INTRAVENOUS | Status: AC

## 2022-07-18 MED ORDER — MAGNESIUM SULFATE 2 GM/50ML IV SOLN
2.0000 g | Freq: Once | INTRAVENOUS | Status: AC
  Administered 2022-07-18: 2 g via INTRAVENOUS
  Filled 2022-07-18: qty 50

## 2022-07-18 MED ORDER — SODIUM CHLORIDE 0.9 % IV SOLN
150.0000 mg | Freq: Once | INTRAVENOUS | Status: AC
  Administered 2022-07-18: 150 mg via INTRAVENOUS
  Filled 2022-07-18: qty 150
  Filled 2022-07-18 (×2): qty 5

## 2022-07-18 MED ORDER — HEPARIN SOD (PORK) LOCK FLUSH 100 UNIT/ML IV SOLN
500.0000 [IU] | Freq: Once | INTRAVENOUS | Status: AC | PRN
  Administered 2022-07-18: 500 [IU]

## 2022-07-18 MED ORDER — SODIUM CHLORIDE 0.9% FLUSH
10.0000 mL | INTRAVENOUS | Status: DC | PRN
  Administered 2022-07-18: 10 mL

## 2022-07-18 MED ORDER — SODIUM CHLORIDE 0.9 % IV SOLN
10.0000 mg | Freq: Once | INTRAVENOUS | Status: AC
  Administered 2022-07-18: 10 mg via INTRAVENOUS
  Filled 2022-07-18: qty 1
  Filled 2022-07-18: qty 10
  Filled 2022-07-18: qty 1

## 2022-07-18 NOTE — Progress Notes (Signed)
Per Dr. Chryl Heck OK to proceed with tx today while magnesium processes

## 2022-07-18 NOTE — Progress Notes (Signed)
Cory [name] is present for follow up for completion of radiation treatment for glottic cancer. He completed treatment on 07-18-22.   Pain issues, if any: no pain Using a feeding tube?: yes, just flushing, eating three meals a day and doing well Weight changes, if any: no major issues Wt Readings from Last 3 Encounters:  07/27/22 212 lb 12.8 oz (96.5 kg)  07/17/22 214 lb 6.4 oz (97.3 kg)  07/12/22 213 lb 12.8 oz (97 kg)    Swallowing issues, if any: no issues to report Smoking or chewing tobacco? Former smoker Using fluoride trays daily? no Last ENT visit was on: September was last visit.  Other notable issues, if any: no major concerns for Dr. Isidore Moos, saliva is improving, taste changes slowly improving  Vitals:   07/27/22 1054  BP: 116/60  Pulse: 92  Resp: 20  Temp: 98.1 F (36.7 C)  SpO2: 100%

## 2022-07-18 NOTE — Progress Notes (Signed)
Oncology Nurse Navigator Documentation   Met with Mr. Depaulo after final RT and final chemotherapy to offer support and to celebrate end of radiation treatment.   Provided verbal post-RT guidance: Importance of keeping all follow-up appts, especially those with Nutrition and SLP. He is aware of upcoming follow ups with Dr. Isidore Moos and Murray Hodgkins.  Importance of protecting treatment area from sun. Continuation of Sonafine application 2-3 times daily, application of antibiotic ointment to areas of raw skin; when supply of Sonafine exhausted transition to OTC lotion with vitamin E.  Explained my role as navigator will continue for several more months, encouraged him to call me with needs/concerns.    [name] RN, BSN, OCN Head & Neck Oncology Nurse [place] at [facility] Phone # [phone]  Fax # [phone]

## 2022-07-18 NOTE — Progress Notes (Signed)
Mg order placed.

## 2022-07-18 NOTE — Progress Notes (Signed)
Nutrition Follow-up:  Pt with SCC of supraglottis. S/p PEG 11/7. Lymph node biopsy revealing atypical lymphocytes concerning for metastatic disease under workup. He is receiving concurrent chemoradiation with weekly cisplatin (first treatment 10/27). Chemotherapy held since 11/29  RD working remotely.   Spoke with patient via telephone during his infusion. He will complete fifth cycle of cisplatin and final radiation today. Patient continues to eat some orally. States it is not pleasant, but is forcing it down. He does pretty well with eggs, but bread is rough. Patient recalls eggs, grits, half piece of toast, and one carton of Osmolite via tube for breakfast. He is supplementing oral intake with 2-3 bolus feedings. Patient is tolerating tube feedings well. He denies nausea, vomiting. Patient has intermittent constipation and diarrhea. This has been better lately.    Medications: reviewed   Labs: glucose 105  Anthropometrics: Wt 214 lb 6.4 oz today   11/28 - 212 lb 11.2 oz 11/17 - 214 lb 5 oz 11/9 - 214 lb 6.4 oz  11/3 - 221 lb 6 oz    NUTRITION DIAGNOSIS: Inadequate oral intake continues, pt supplementing with tube feedings   INTERVENTION:  Encouraged oral intake of soft moist foods as tolerated  Continue baking soda salt water rinses several times daily Continue ginger ale rinse for thick saliva Continue giving 2-3 cartons Osmolite 1.5 via tube. If unable to eat orally, pt encouraged to increase to goal of 7 cartons/day    MONITORING, EVALUATION, GOAL: weight trends, intake, tube feedings   NEXT VISIT: To be scheduled

## 2022-07-18 NOTE — Patient Instructions (Signed)
Cisplatin Injection What is this medication? CISPLATIN (SIS pla tin) treats some types of cancer. It works by slowing down the growth of cancer cells. This medicine may be used for other purposes; ask your health care provider or pharmacist if you have questions. COMMON BRAND NAME(S): Platinol, Platinol -AQ What should I tell my care team before I take this medication? They need to know if you have any of these conditions: Eye disease, vision problems Hearing problems Kidney disease Low blood counts, such as low white cells, platelets, or red blood cells Tingling of the fingers or toes, or other nerve disorder An unusual or allergic reaction to cisplatin, carboplatin, oxaliplatin, other medications, foods, dyes, or preservatives If you or your partner are pregnant or trying to get pregnant Breast-feeding How should I use this medication? This medication is injected into a vein. It is given by your care team in a hospital or clinic setting. Talk to your care team about the use of this medication in children. Special care may be needed. Overdosage: If you think you have taken too much of this medicine contact a poison control center or emergency room at once. NOTE: This medicine is only for you. Do not share this medicine with others. What if I miss a dose? Keep appointments for follow-up doses. It is important not to miss your dose. Call your care team if you are unable to keep an appointment. What may interact with this medication? Do not take this medication with any of the following: Live virus vaccines This medication may also interact with the following: Certain antibiotics, such as amikacin, gentamicin, neomycin, polymyxin B, streptomycin, tobramycin, vancomycin Foscarnet This list may not describe all possible interactions. Give your health care provider a list of all the medicines, herbs, non-prescription drugs, or dietary supplements you use. Also tell them if you smoke, drink  alcohol, or use illegal drugs. Some items may interact with your medicine. What should I watch for while using this medication? Your condition will be monitored carefully while you are receiving this medication. You may need blood work done while taking this medication. This medication may make you feel generally unwell. This is not uncommon, as chemotherapy can affect healthy cells as well as cancer cells. Report any side effects. Continue your course of treatment even though you feel ill unless your care team tells you to stop. This medication may increase your risk of getting an infection. Call your care team for advice if you get a fever, chills, sore throat, or other symptoms of a cold or flu. Do not treat yourself. Try to avoid being around people who are sick. Avoid taking medications that contain aspirin, acetaminophen, ibuprofen, naproxen, or ketoprofen unless instructed by your care team. These medications may hide a fever. This medication may increase your risk to bruise or bleed. Call your care team if you notice any unusual bleeding. Be careful brushing or flossing your teeth or using a toothpick because you may get an infection or bleed more easily. If you have any dental work done, tell your dentist you are receiving this medication. Drink fluids as directed while you are taking this medication. This will help protect your kidneys. Call your care team if you get diarrhea. Do not treat yourself. Talk to your care team if you or your partner wish to become pregnant or think you might be pregnant. This medication can cause serious birth defects if taken during pregnancy and for 14 months after the last dose. A negative pregnancy   test is required before starting this medication. A reliable form of contraception is recommended while taking this medication and for 14 months after the last dose. Talk to your care team about effective forms of contraception. Do not father a child while taking this  medication and for 11 months after the last dose. Use a condom during sex during this time period. Do not breast-feed while taking this medication. This medication may cause infertility. Talk to your care team if you are concerned about your fertility. What side effects may I notice from receiving this medication? Side effects that you should report to your care team as soon as possible: Allergic reactions--skin rash, itching, hives, swelling of the face, lips, tongue, or throat Eye pain, change in vision, vision loss Hearing loss, ringing in ears Infection--fever, chills, cough, sore throat, wounds that don't heal, pain or trouble when passing urine, general feeling of discomfort or being unwell Kidney injury--decrease in the amount of urine, swelling of the ankles, hands, or feet Low red blood cell level--unusual weakness or fatigue, dizziness, headache, trouble breathing Painful swelling, warmth, or redness of the skin, blisters or sores at the infusion site Pain, tingling, or numbness in the hands or feet Unusual bruising or bleeding Side effects that usually do not require medical attention (report to your care team if they continue or are bothersome): Hair loss Nausea Vomiting This list may not describe all possible side effects. Call your doctor for medical advice about side effects. You may report side effects to FDA at 1-800-FDA-1088. Where should I keep my medication? This medication is given in a hospital or clinic. It will not be stored at home. NOTE: This sheet is a summary. It may not cover all possible information. If you have questions about this medicine, talk to your doctor, pharmacist, or health care provider.  2023 [employer]/Gold Standard (2021-11-17 00:00:00)  

## 2022-07-19 ENCOUNTER — Ambulatory Visit: Payer: Medicare HMO

## 2022-07-19 NOTE — Telephone Encounter (Signed)
Cory [name], [facility]      06/06/22  2:41 PM Note Xarelto patient assistance application faxed to company

## 2022-07-24 ENCOUNTER — Telehealth: Payer: Self-pay | Admitting: Family Medicine

## 2022-07-24 NOTE — Telephone Encounter (Signed)
Pt called to ask if MD had any samples of Xarelto to hold him off until the end of the year?  Please call him back and let him know.

## 2022-07-24 NOTE — Telephone Encounter (Signed)
Patient informed that Xarelto 20 mg samples have been placed in our front office for pickup

## 2022-07-27 ENCOUNTER — Encounter: Payer: Self-pay | Admitting: Radiation Oncology

## 2022-07-27 ENCOUNTER — Ambulatory Visit
Admission: RE | Admit: 2022-07-27 | Discharge: 2022-07-27 | Disposition: A | Discharge status: Home / Self Care | Payer: Medicare HMO | Source: Ambulatory Visit | Attending: Radiation Oncology | Admitting: Radiation Oncology

## 2022-07-27 ENCOUNTER — Telehealth: Payer: Self-pay | Admitting: Physician Assistant

## 2022-07-27 VITALS — BP 116/60 | HR 92 | Temp 98.1°F | Resp 20 | Ht 76.0 in | Wt 212.8 lb

## 2022-07-27 DIAGNOSIS — Z79899 Other long term (current) drug therapy: Secondary | ICD-10-CM | POA: Insufficient documentation

## 2022-07-27 DIAGNOSIS — Z7901 Long term (current) use of anticoagulants: Secondary | ICD-10-CM | POA: Insufficient documentation

## 2022-07-27 DIAGNOSIS — Z7952 Long term (current) use of systemic steroids: Secondary | ICD-10-CM | POA: Diagnosis not present

## 2022-07-27 DIAGNOSIS — C321 Malignant neoplasm of supraglottis: Secondary | ICD-10-CM | POA: Diagnosis not present

## 2022-07-27 MED ORDER — SONAFINE EX EMUL
1.0000 | Freq: Once | CUTANEOUS | Status: AC
  Administered 2022-07-27: 1 via TOPICAL

## 2022-07-27 NOTE — Telephone Encounter (Signed)
Called patient to schedule additional appointment for labs on 12/26. Patient notified of earlier appointment.

## 2022-07-27 NOTE — Progress Notes (Signed)
Radiation Oncology         [phone] ________________________________  Name: Cory [name] MRN: [id number]  Date: 07/27/2022  DOB: January 27, 1946  Follow-Up Visit Note  CC: [name], MD  Melida Quitter, MD  Diagnosis and Prior Radiotherapy:       ICD-10-CM   1. Malignant neoplasm of supraglottis ([place])  C32.1 Sonafine emulsion 1 Application    2. Cancer of supraglottis ([place])  C32.1 NM PET Image Restag (PS) Skull Base To Thigh      CHIEF COMPLAINT:  Here for follow-up and surveillance of throat cancer  Narrative:  The patient returns today for routine follow-up.  Doing well, no new complaints, healing well, eating everything by mouth, flushing PEG tube.                   ALLERGIES:  is allergic to zithromax [azithromycin].  Meds: Current Outpatient Medications  Medication Sig Dispense Refill   acetaminophen (TYLENOL) 500 MG tablet Take 500 mg by mouth every 6 (six) hours as needed (for pain.).     cetirizine (ZYRTEC) 10 MG tablet Take 1 tablet (10 mg total) by mouth daily. (Patient taking differently: Take 10 mg by mouth daily as needed for allergies.) 30 tablet 11   cholecalciferol (VITAMIN D) 25 MCG (1000 UNIT) tablet Take 1,000 Units by mouth 2 (two) times daily.     diltiazem (TIADYLT ER) 120 MG 24 hr capsule TAKE 1 CAPSULE EVERY DAY (SCHEDULE OFFICE VISIT FOR FUTURE REFILLS) 90 capsule 1   lidocaine (XYLOCAINE) 2 % solution Patient: Mix 1part 2% viscous lidocaine, 1part H20. Swish & swallow 29m of diluted mixture, 3109m before meals  and at bedtime, up to QID prn sore throat. 200 mL 3   lidocaine (XYLOCAINE) 2 % solution Mix 1 part lidocaine with 1 part water, then swish and swallow 10 ml of mixture 30 minutes before meals and at bedtime up to 4 times daily as needed for sore throat 200 mL 0   lidocaine-prilocaine (EMLA) cream Apply to affected area once 30 g 3   metoprolol tartrate (LOPRESSOR) 25 MG tablet Take 1 tablet (25 mg total) by mouth 2 (two) times daily as  needed. 30 tablet 1   nicotine (NICODERM CQ - DOSED IN MG/24 HOURS) 21 mg/24hr patch Place 1 patch (21 mg total) onto the skin daily. Apply 21 mg patch daily x 6 wk, then '14mg'$  patch daily x 2 wk, then 7 mg patch daily x 2 wk 14 patch 2   rivaroxaban (XARELTO) 20 MG TABS tablet Take 1 tablet (20 mg total) by mouth daily with supper. 90 tablet 1   rosuvastatin (CRESTOR) 10 MG tablet Take 1 tablet (10 mg total) by mouth daily. 90 tablet 1   sodium chloride (OCEAN) 0.65 % SOLN nasal spray Place 1 spray into both nostrils as needed for congestion.     dexamethasone (DECADRON) 4 MG tablet Take 2 tablets daily x 3 days starting the day after cisplatin chemotherapy. Take with food. (Patient not taking: Reported on 07/27/2022) 30 tablet 1   omeprazole (PRILOSEC) 40 MG capsule Take 1 capsule (40 mg total) by mouth daily. (Patient not taking: Reported on 07/27/2022) 30 capsule 6   ondansetron (ZOFRAN) 8 MG tablet Take 1 tablet (8 mg total) by mouth every 8 (eight) hours as needed for nausea or vomiting. Start on the third day after cisplatin. (Patient not taking: Reported on 07/12/2022) 30 tablet 1   prochlorperazine (COMPAZINE) 10 MG tablet Take 1 tablet (  10 mg total) by mouth every 6 (six) hours as needed (Nausea or vomiting). (Patient not taking: Reported on 07/12/2022) 30 tablet 1   No current facility-administered medications for this encounter.    Physical Findings: The patient is in no acute distress. Patient is alert and oriented. Wt Readings from Last 3 Encounters:  07/27/22 212 lb 12.8 oz (96.5 kg)  07/17/22 214 lb 6.4 oz (97.3 kg)  07/12/22 213 lb 12.8 oz (97 kg)    height is '6\' 4"'$  (1.93 m) and weight is 212 lb 12.8 oz (96.5 kg). His temperature is 98.1 F (36.7 C). His blood pressure is 116/60 and his pulse is 92. His respiration is 20 and oxygen saturation is 100%. .  General: Alert and oriented, in no acute distress HEENT: Head is normocephalic. Extraocular movements are intact. Oropharynx is  notable for no lesions Neck: Neck is notable for no masses, skin intact Skin: Skin in treatment fields shows satisfactory healing over neck Lymphatics: see Neck Exam Psychiatric: Judgment and insight are intact. Affect is appropriate.   Lab Findings: Lab Results  Component Value Date   WBC 2.0 (L) 07/17/2022   HGB 11.6 (L) 07/17/2022   HCT 33.4 (L) 07/17/2022   MCV 96.8 07/17/2022   PLT 202 07/17/2022    Lab Results  Component Value Date   TSH 0.798 05/08/2022    Radiographic Findings: No results found.  Impression/Plan:    1) Head and Neck Cancer Status: Healing well  2) Nutritional Status: Stable weight PEG tube: Flushing  3) Risk Factors: The patient has been educated about risk factors including alcohol and tobacco abuse; they understand that avoidance of alcohol and tobacco is important to prevent recurrences as well as other cancers  4) Swallowing: Good function  5) Dental: Encouraged to continue regular followup with dentistry, and dental hygiene including fluoride rinses.    6) Thyroid function: Check annually Lab Results  Component Value Date   TSH 0.798 05/08/2022    7)  Follow-up in mid March with restaging PET scan. The patient was encouraged to call with any issues or questions before then.  On date of service, in total, I spent 20 minutes on this encounter. Patient was seen in person. _____________________________________   [name], MD

## 2022-07-29 ENCOUNTER — Other Ambulatory Visit: Payer: Self-pay

## 2022-07-31 ENCOUNTER — Inpatient Hospital Stay: Payer: Medicare HMO

## 2022-07-31 ENCOUNTER — Inpatient Hospital Stay (HOSPITAL_BASED_OUTPATIENT_CLINIC_OR_DEPARTMENT_OTHER): Payer: Medicare HMO | Admitting: Physician Assistant

## 2022-07-31 VITALS — BP 120/67 | HR 72 | Temp 97.7°F | Resp 17 | Wt 213.0 lb

## 2022-07-31 DIAGNOSIS — Z95828 Presence of other vascular implants and grafts: Secondary | ICD-10-CM

## 2022-07-31 DIAGNOSIS — C321 Malignant neoplasm of supraglottis: Secondary | ICD-10-CM

## 2022-07-31 LAB — COMPREHENSIVE METABOLIC PANEL
ALT: 9 U/L (ref 0–44)
AST: 9 U/L — ABNORMAL LOW (ref 15–41)
Albumin: 3.4 g/dL — ABNORMAL LOW (ref 3.5–5.0)
Alkaline Phosphatase: 48 U/L (ref 38–126)
Anion gap: 4 — ABNORMAL LOW (ref 5–15)
BUN: 13 mg/dL (ref 8–23)
CO2: 28 mmol/L (ref 22–32)
Calcium: 8.7 mg/dL — ABNORMAL LOW (ref 8.9–10.3)
Chloride: 105 mmol/L (ref 98–111)
Creatinine, Ser: 0.8 mg/dL (ref 0.61–1.24)
GFR, Estimated: >60 mL/min (ref >60)
Glucose, Bld: 115 mg/dL — ABNORMAL HIGH (ref 70–99)
Potassium: 3.6 mmol/L (ref 3.5–5.1)
Sodium: 137 mmol/L (ref 135–145)
Total Bilirubin: 0.3 mg/dL (ref 0.3–1.2)
Total Protein: 5.8 g/dL — ABNORMAL LOW (ref 6.5–8.1)

## 2022-07-31 LAB — CBC WITH DIFFERENTIAL/PLATELET
Abs Immature Granulocytes: 0.04 10*3/uL (ref 0.00–0.07)
Basophils Absolute: 0 10*3/uL (ref 0.0–0.1)
Basophils Relative: 1 %
Eosinophils Absolute: 0.1 10*3/uL (ref 0.0–0.5)
Eosinophils Relative: 1 %
HCT: 33.3 % — ABNORMAL LOW (ref 39.0–52.0)
Hemoglobin: 11.5 g/dL — ABNORMAL LOW (ref 13.0–17.0)
Immature Granulocytes: 1 %
Lymphocytes Relative: 9 %
Lymphs Abs: 0.5 10*3/uL — ABNORMAL LOW (ref 0.7–4.0)
MCH: 33.6 pg (ref 26.0–34.0)
MCHC: 34.5 g/dL (ref 30.0–36.0)
MCV: 97.4 fL (ref 80.0–100.0)
Monocytes Absolute: 0.9 10*3/uL (ref 0.1–1.0)
Monocytes Relative: 15 %
Neutro Abs: 4.2 10*3/uL (ref 1.7–7.7)
Neutrophils Relative %: 73 %
Platelets: 177 10*3/uL (ref 150–400)
RBC: 3.42 MIL/uL — ABNORMAL LOW (ref 4.22–5.81)
RDW: 16.3 % — ABNORMAL HIGH (ref 11.5–15.5)
WBC: 5.7 10*3/uL (ref 4.0–10.5)
nRBC: 0 % (ref 0.0–0.2)

## 2022-07-31 MED ORDER — SODIUM CHLORIDE 0.9% FLUSH
10.0000 mL | Freq: Once | INTRAVENOUS | Status: AC
  Administered 2022-07-31: 10 mL

## 2022-07-31 MED ORDER — HEPARIN SOD (PORK) LOCK FLUSH 100 UNIT/ML IV SOLN
500.0000 [IU] | Freq: Once | INTRAVENOUS | Status: AC
  Administered 2022-07-31: 500 [IU]

## 2022-07-31 NOTE — Progress Notes (Signed)
[place] NOTE  Patient Care Team: [name], MD as PCP - General [name], MD as PCP - Cardiology (Cardiology) Viona Gilmore, [facility] as Pharmacist (Pharmacist) Malmfelt, Stephani Police, RN as Oncology Nurse Navigator [name], MD as Consulting Physician (Radiation Oncology) Benay Pike, MD as Consulting Physician (Hematology and Oncology)  CHIEF COMPLAINTS/PURPOSE OF CONSULTATION:  SCC supraglottis  ONCOLOGIC HISTORY: Initially presented to ENT with chronic left-sided sore throat for almost a year.  Initial evaluation did not show any possible causes for sore throat.  He was recommended PPI twice daily but went back to ENT with persistent sore throat he once again had a repeat fiberoptic exam which was stable.   CT neck done on November 08, 2021 did show subcentimeter focus of mucosal hyperenhancement along the posterior aspect of the uvula  laryngoscopy with direct visualization recommended to exclude a mucosal lesion at the site.  Additionally few tiny cystic-appearing foci along the left glossotonsillar sulcus.  Direct visualization recommended.  Nonspecific mildly enlarged right level 2 lymph node.  Multiple small calcific foci in the left palatine tonsil.Repeat fiberoptic exam was unremarkable hence Dr. 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  Tongue biopsy showed benign squamous mucosa with low to intermediate grade dysplasia. 05/07/2022: PET/CT showed asymmetric hypermetabolism in the left oropharynx and involving the left epiglottis.  Contralateral hypermetabolic level 2 cervical lymph nodes compatible with metastatic disease.  No hypermetabolic adenopathy in the left neck or in the chest.  1.2 cm short axis aortocaval lymph node in the upper abdomen is  hypermetabolic metastatic disease is a concern.  This lymph node has increased from 0.7 cm on the study from 2022.  No other metastatic disease. Received radiation from 05/30/2022-07/18/2022 and weekly cisplatin from 06/01/2022-07/18/2022.   INTERIM HISTORY:  Cory [name] returns for a follow up after completion of chemoradiation on 12/13/223.  He reports his energy levels are slowly improving.He still struggles with dry mouth but uses biotene mouthwash with improvement. He denies dysphagia and tries to eat 3 meals per day. He uses his G tube occasionally. He denies any nausea, vomiting or abdominal pain. He has occasional constipation that improves with stool softeners. He denies easy bruising or signs of active bleeding. He denies fevers, chills, sweats, shortness of breath, chest pain or cough. He has no other complaints.  Rest of the pertinent 10 point ROS reviewed and negative  MEDICAL HISTORY:  Past Medical History:  Diagnosis Date   Aortic stenosis    a. Moderate by echo 05/2020.   Arthritis    right hip   Cancer ([place])    skin cancer nose, right left, upper left basel cell   Dysrhythmia    a-fib   PAF (paroxysmal atrial fibrillation) (HCC)    Peripheral arterial disease (HCC)    Tobacco abuse     SURGICAL HISTORY: Past Surgical History:  Procedure Laterality Date   ABDOMINAL AORTIC ENDOVASCULAR STENT GRAFT N/A 08/29/2020   Procedure: ABDOMINAL AORTIC ENDOVASCULAR STENT GRAFT;  Surgeon: Elam Dutch, MD;  Location: [place];  Service: Vascular;  Laterality: N/A;   ABDOMINAL AORTOGRAM W/LOWER EXTREMITY Bilateral 06/24/2020   Procedure: ABDOMINAL AORTOGRAM W/LOWER EXTREMITY;  Surgeon: Elam Dutch, MD;  Location: [place] [facility];  Service: Cardiovascular;  Laterality: Bilateral;   BACK SURGERY     2005   BIOPSY  05/21/2022   Procedure: BIOPSY;  Surgeon: Irving Copas., MD;  Location: [facility];  Service: Gastroenterology;;   CERVICAL SPINE SURGERY      DIRECT LARYNGOSCOPY Bilateral 04/18/2022   Procedure: DIRECT LARYNGOSCOPY WITH BIOPSIES;  Surgeon: Melida Quitter, MD;  Location: [place];  Service: ENT;  Laterality: Bilateral;   EMBOLECTOMY Right 08/29/2020   Procedure: POPLITEAL AND TIBIAL EMBOLECTOMY;  Surgeon: Elam Dutch, MD;  Location: [place];  Service: Vascular;  Laterality: Right;   EMBOLIZATION Right 06/24/2020   Procedure: EMBOLIZATION;  Surgeon: Elam Dutch, MD;  Location: [place] [facility];  Service: Cardiovascular;  Laterality: Right;   ENDARTERECTOMY FEMORAL Right 08/29/2020   Procedure: ENDARTERECTOMY FEMORAL;  Surgeon: Elam Dutch, MD;  Location: [facility];  Service: Vascular;  Laterality: Right;   ESOPHAGOGASTRODUODENOSCOPY N/A 06/27/2022   Procedure: ESOPHAGOGASTRODUODENOSCOPY (EGD);  Surgeon: Irving Copas., MD;  Location: [facility];  Service: Gastroenterology;  Laterality: N/A;   ESOPHAGOGASTRODUODENOSCOPY (EGD) WITH PROPOFOL N/A 05/21/2022   Procedure: ESOPHAGOGASTRODUODENOSCOPY (EGD) WITH PROPOFOL;  Surgeon: Rush Landmark Telford Nab., MD;  Location: [facility];  Service: Gastroenterology;  Laterality: N/A;   EUS N/A 05/21/2022   Procedure: UPPER ENDOSCOPIC ULTRASOUND (EUS) RADIAL;  Surgeon: Irving Copas., MD;  Location: [facility];  Service: Gastroenterology;  Laterality: N/A;   EUS N/A 06/27/2022   Procedure: UPPER ENDOSCOPIC ULTRASOUND (EUS) RADIAL;  Surgeon: Irving Copas., MD;  Location: [facility];  Service: Gastroenterology;  Laterality: N/A;   EYE SURGERY     FEMORAL ARTERY EXPLORATION Right 06/25/2020   Procedure: RIGHT GROIN EXPLORATION Evacuation of Hematoma  WITH REPAIR OF RIGHT Common FEMORAL ARTERY.;  Surgeon: Angelia Mould, MD;  Location: [place];  Service: Vascular;  Laterality: Right;   FEMORAL-POPLITEAL BYPASS GRAFT Left 11/22/2020   Procedure: LEFT ABOVE KNEE-BELOW KNEE BYPASS TIBIAL PERITONEAL TRUNK REVERSE IPSILATERAL GREATER SAPHENOUS VEIN;  Surgeon:  Elam Dutch, MD;  Location: [facility];  Service: Vascular;  Laterality: Left;   FINE NEEDLE ASPIRATION N/A 05/21/2022   Procedure: FINE NEEDLE ASPIRATION (FNA) LINEAR;  Surgeon: Irving Copas., MD;  Location: [facility];  Service: Gastroenterology;  Laterality: N/A;   FINE NEEDLE ASPIRATION  06/27/2022   Procedure: FINE NEEDLE ASPIRATION;  Surgeon: Rush Landmark Telford Nab., MD;  Location: [facility];  Service: Gastroenterology;;   HOT HEMOSTASIS N/A 06/27/2022   Procedure: HOT HEMOSTASIS (ARGON PLASMA COAGULATION/BICAP);  Surgeon: Irving Copas., MD;  Location: [facility];  Service: Gastroenterology;  Laterality: N/A;   IR GASTROSTOMY TUBE MOD SED  06/12/2022   IR IMAGING GUIDED PORT INSERTION  06/12/2022   LEG SURGERY     Trauma   LOWER EXTREMITY ANGIOGRAPHY Left 11/17/2020   Procedure: Lower Extremity Angiography;  Surgeon: Marty Heck, MD;  Location: [place] [facility];  Service: Cardiovascular;  Laterality: Left;   PATCH ANGIOPLASTY Right 08/29/2020   Procedure: PATCH ANGIOPLASTY RIGHT BELOW KNEE POPLITEAL ARTERY AND RIGHT COMMON FEMORAL ARTERY;  Surgeon: Elam Dutch, MD;  Location: [place];  Service: Vascular;  Laterality: Right;   RETINAL DETACHMENT SURGERY     RIGID ESOPHAGOSCOPY  04/18/2022   Procedure: RIGID ESOPHAGOSCOPY;  Surgeon: Melida Quitter, MD;  Location: [place];  Service: ENT;;   TONSILLECTOMY  04/18/2022   Procedure: LEFT TONSILLECTOMY;  Surgeon: Melida Quitter, MD;  Location: [place];  Service: ENT;;   ULTRASOUND GUIDANCE FOR VASCULAR ACCESS Bilateral 08/29/2020   Procedure: ULTRASOUND GUIDANCE FOR VASCULAR ACCESS;  Surgeon: Elam Dutch, MD;  Location: [place];  Service:  Vascular;  Laterality: Bilateral;    SOCIAL HISTORY: Social History   Socioeconomic History   Marital status: Married    Spouse name: Not on file   Number of children: 5   Years of education: Not on file   Highest education level: Master's degree (e.g., MA, MS, MEng,  MEd, MSW, MBA)  Occupational History   Not on file  Tobacco Use   Smoking status: Every Day    Packs/day: 0.50    Years: 50.00    Total pack years: 25.00    Types: Cigarettes    Passive exposure: Never   Smokeless tobacco: Never  Vaping Use   Vaping Use: Never used  Substance and Sexual Activity   Alcohol use: Yes    Alcohol/week: 0.0 standard drinks of alcohol    Comment: 4 to 5 times a week; beer, wine and liquor    Drug use: No   Sexual activity: Not Currently  Other Topics Concern   Not on file  Social History Narrative   Scores reading tests.  Lives with wife.     Social Determinants of Health   Financial Resource Strain: Low Risk  (05/24/2022)   Overall Financial Resource Strain (CARDIA)    Difficulty of Paying Living Expenses: Not hard at all  Food Insecurity: No Food Insecurity (05/24/2022)   Hunger Vital Sign    Worried About Running Out of Food in the Last Year: Never true    Ran Out of Food in the Last Year: Never true  Transportation Needs: No Transportation Needs (05/24/2022)   PRAPARE - Hydrologist (Medical): No    Lack of Transportation (Non-Medical): No  Physical Activity: Insufficiently Active (01/08/2022)   Exercise Vital Sign    Days of Exercise per Week: 3 days    Minutes of Exercise per Session: 30 min  Stress: No Stress Concern Present (01/08/2022)   [place]    Feeling of Stress : Not at all  Social Connections: Moderately Isolated (01/08/2022)   Social Connection and Isolation Panel [NHANES]    Frequency of Communication with Friends and Family: More than three times a week    Frequency of Social Gatherings with Friends and Family: Three times a week    Attends Religious Services: Never    Active Member of Clubs or Organizations: No    Attends Archivist Meetings: Not on file    Marital Status: Married  Human resources officer Violence: Not At Risk  (06/07/2021)   Humiliation, Afraid, Rape, and Kick questionnaire    Fear of Current or Ex-Partner: No    Emotionally Abused: No    Physically Abused: No    Sexually Abused: No    FAMILY HISTORY: Family History  Problem Relation Age of Onset   Ovarian cancer Mother    Alcoholism Father    Breast cancer Sister    Ovarian cancer Sister     ALLERGIES:  is allergic to zithromax [azithromycin].  MEDICATIONS:  Current Outpatient Medications  Medication Sig Dispense Refill   acetaminophen (TYLENOL) 500 MG tablet Take 500 mg by mouth every 6 (six) hours as needed (for pain.).     cetirizine (ZYRTEC) 10 MG tablet Take 1 tablet (10 mg total) by mouth daily. (Patient taking differently: Take 10 mg by mouth daily as needed for allergies.) 30 tablet 11   cholecalciferol (VITAMIN D) 25 MCG (1000 UNIT) tablet Take 1,000 Units by mouth 2 (two) times daily.  diltiazem (TIADYLT ER) 120 MG 24 hr capsule TAKE 1 CAPSULE EVERY DAY (SCHEDULE OFFICE VISIT FOR FUTURE REFILLS) 90 capsule 1   lidocaine (XYLOCAINE) 2 % solution Patient: Mix 1part 2% viscous lidocaine, 1part H20. Swish & swallow 23m of diluted mixture, 365m before meals  and at bedtime, up to QID prn sore throat. 200 mL 3   lidocaine (XYLOCAINE) 2 % solution Mix 1 part lidocaine with 1 part water, then swish and swallow 10 ml of mixture 30 minutes before meals and at bedtime up to 4 times daily as needed for sore throat 200 mL 0   lidocaine-prilocaine (EMLA) cream Apply to affected area once 30 g 3   metoprolol tartrate (LOPRESSOR) 25 MG tablet Take 1 tablet (25 mg total) by mouth 2 (two) times daily as needed. 30 tablet 1   nicotine (NICODERM CQ - DOSED IN MG/24 HOURS) 21 mg/24hr patch Place 1 patch (21 mg total) onto the skin daily. Apply 21 mg patch daily x 6 wk, then '14mg'$  patch daily x 2 wk, then 7 mg patch daily x 2 wk 14 patch 2   rivaroxaban (XARELTO) 20 MG TABS tablet Take 1 tablet (20 mg total) by mouth daily with supper. 90 tablet 1    rosuvastatin (CRESTOR) 10 MG tablet Take 1 tablet (10 mg total) by mouth daily. 90 tablet 1   sodium chloride (OCEAN) 0.65 % SOLN nasal spray Place 1 spray into both nostrils as needed for congestion.     dexamethasone (DECADRON) 4 MG tablet Take 2 tablets daily x 3 days starting the day after cisplatin chemotherapy. Take with food. (Patient not taking: Reported on 07/27/2022) 30 tablet 1   omeprazole (PRILOSEC) 40 MG capsule Take 1 capsule (40 mg total) by mouth daily. (Patient not taking: Reported on 07/27/2022) 30 capsule 6   ondansetron (ZOFRAN) 8 MG tablet Take 1 tablet (8 mg total) by mouth every 8 (eight) hours as needed for nausea or vomiting. Start on the third day after cisplatin. (Patient not taking: Reported on 07/12/2022) 30 tablet 1   prochlorperazine (COMPAZINE) 10 MG tablet Take 1 tablet (10 mg total) by mouth every 6 (six) hours as needed (Nausea or vomiting). (Patient not taking: Reported on 07/12/2022) 30 tablet 1   No current facility-administered medications for this visit.     PHYSICAL EXAMINATION: ECOG PERFORMANCE STATUS: 1 - Symptomatic but completely ambulatory  Vitals:   07/31/22 1433  BP: 120/67  Pulse: 72  Resp: 17  Temp: 97.7 F (36.5 C)  SpO2: 97%   Filed Weights   07/31/22 1433  Weight: 213 lb (96.6 kg)    Physical Exam Constitutional:      Appearance: Normal appearance.  Cardiovascular:     Rate and Rhythm: Normal rate and regular rhythm.  Pulmonary:     Effort: Pulmonary effort is normal.     Breath sounds: Normal breath sounds.  Abdominal:     Comments: G tube site appears well   Musculoskeletal:        General: No swelling or tenderness. Normal range of motion.     Cervical back: Normal range of motion and neck supple. No rigidity.  Lymphadenopathy:     Cervical: No cervical adenopathy.  Skin:    General: Skin is warm and dry.  Neurological:     General: No focal deficit present.     Mental Status: He is alert.      LABORATORY  DATA:  I have reviewed the data as listed Lab Results  Component Value Date   WBC 5.7 07/31/2022   HGB 11.5 (L) 07/31/2022   HCT 33.3 (L) 07/31/2022   MCV 97.4 07/31/2022   PLT 177 07/31/2022     Chemistry      Component Value Date/Time   NA 137 07/31/2022 1354   NA 141 05/16/2018 1425   K 3.6 07/31/2022 1354   CL 105 07/31/2022 1354   CO2 28 07/31/2022 1354   BUN 13 07/31/2022 1354   BUN 13 05/16/2018 1425   CREATININE 0.80 07/31/2022 1354   CREATININE 0.84 07/03/2022 0812      Component Value Date/Time   CALCIUM 8.7 (L) 07/31/2022 1354   ALKPHOS 48 07/31/2022 1354   AST 9 (L) 07/31/2022 1354   ALT 9 07/31/2022 1354   BILITOT 0.3 07/31/2022 1354       RADIOGRAPHIC STUDIES: I have personally reviewed the radiological images as listed and agreed with the findings in the report. No results found.  ASSESSMENT AND PLAN: Cory [name] is a 76 y.o. male who presents to the clinic for a follow up for SCC of supraglottis.  #SCC of supraglottis: --Recommended concurrent chemoradiation. Radiation was started on 05/30/2022. Weekly cisplatin started on 06/01/2022. Completed therapy on 07/18/2022.  --Labs from today were reviewed. Neutropenia and thrombocytopenia have resolved. Anemia is stable at 11.5.  --Patient's symptoms from chemoradiation are slowly resolving. No intervention required at this time.  --Patient will undergo post treatment PET scan in 3 months. --RTC after PET can for follow up around March 2024.     #Lymph node biopsy: --Path concerning for B-cell lymphoproliferative process.  --We will plan to follow up with upcoming PET scan.  --He is clinically asymptomatic  No problem-specific Assessment & Plan notes found for this encounter.  All questions were answered. The patient knows to call the clinic with any problems, questions or concerns.  I have spent a total of 30 minutes minutes of face-to-face and non-face-to-face time, preparing to see the patient,  performing a medically appropriate examination, counseling and educating the patient, documenting clinical information in the electronic health record, and care coordination.    Dede Query PA-C Dept of Hematology and [place] at [facility] Phone: [phone]

## 2022-07-31 NOTE — Progress Notes (Unsigned)
Oncology Nurse Navigator Documentation   I met Cory [name] before his appointment with Dr. Isidore Moos. He is recovering from his recent treatment for head and neck cancer. He is eating orally and using his PEG minimally but still reports taste changes. He will have a PET mid March and see Dr. Isidore Moos for results afterwards. He knows to call me if he has any questions or concerns before then.   [name] RN, BSN, OCN Head & Neck Oncology Nurse [place] at [facility] Phone # [phone]  Fax # [phone]

## 2022-08-01 ENCOUNTER — Telehealth: Payer: Self-pay | Admitting: Pharmacist

## 2022-08-01 ENCOUNTER — Encounter: Payer: Self-pay | Admitting: Hematology and Oncology

## 2022-08-01 ENCOUNTER — Encounter: Payer: Self-pay | Admitting: *Deleted

## 2022-08-01 ENCOUNTER — Telehealth: Payer: Self-pay | Admitting: *Deleted

## 2022-08-01 ENCOUNTER — Telehealth: Payer: Self-pay | Admitting: Hematology and Oncology

## 2022-08-01 NOTE — Chronic Care Management (AMB) (Signed)
Chronic Care Management Pharmacy Assistant   Name: Cory [name]  MRN: [id number] DOB: June 27, 1946  Reason for Encounter: Cory [name] Follow up   Recent office visits:  None  Recent consult visits:  07/31/22 Cory [name] (Oncology) - Patient presented for Malignant neoplasm of Supraglottitis . No medication changes.  07/27/22 Patient presented to [name] for Oncology Treatment.  07/17/22 Cory Brigham, PA-C (Oncology) - Patient presented for Malignant neoplasm of Supraglottitis . No medication changes.  07/12/22 [name], MD (Cardiology) - Patient presented for PAF and other concerns.   06/21/22 Cory Brigham, PA-C (Oncology) - Patient presented for Malignant neoplasm of Supraglottitis . No medication changes.  Hospital visits:  Medication Reconciliation was completed by comparing discharge summary, patient's EMR and Pharmacy list, and upon discussion with patient.  Patient presented to [facility] on 06/27/22 due to Lymph node enlargement and other concerns. Patient was present for 3 hours  New?Medications Started at [facility] Discharge:?? -started  none  Medication Changes at Hospital Discharge: -Changed Xarelto  Medications Discontinued at Hospital Discharge: -Stopped  none  Medications that remain the same after Hospital Discharge:??  -All other medications will remain the same.    Medications: Outpatient Encounter Medications as of 08/01/2022  Medication Sig   acetaminophen (TYLENOL) 500 MG tablet Take 500 mg by mouth every 6 (six) hours as needed (for pain.).   cetirizine (ZYRTEC) 10 MG tablet Take 1 tablet (10 mg total) by mouth daily. (Patient taking differently: Take 10 mg by mouth daily as needed for allergies.)   cholecalciferol (VITAMIN D) 25 MCG (1000 UNIT) tablet Take 1,000 Units by mouth 2 (two) times daily.   dexamethasone (DECADRON) 4 MG tablet Take 2 tablets daily x 3 days starting the day after cisplatin  chemotherapy. Take with food. (Patient not taking: Reported on 07/27/2022)   diltiazem (TIADYLT ER) 120 MG 24 hr capsule TAKE 1 CAPSULE EVERY DAY (SCHEDULE OFFICE VISIT FOR FUTURE REFILLS)   lidocaine (XYLOCAINE) 2 % solution Patient: Mix 1part 2% viscous lidocaine, 1part H20. Swish & swallow 23m of diluted mixture, 385m before meals  and at bedtime, up to QID prn sore throat.   lidocaine (XYLOCAINE) 2 % solution Mix 1 part lidocaine with 1 part water, then swish and swallow 10 ml of mixture 30 minutes before meals and at bedtime up to 4 times daily as needed for sore throat   lidocaine-prilocaine (EMLA) cream Apply to affected area once   metoprolol tartrate (LOPRESSOR) 25 MG tablet Take 1 tablet (25 mg total) by mouth 2 (two) times daily as needed.   nicotine (NICODERM CQ - DOSED IN MG/24 HOURS) 21 mg/24hr patch Place 1 patch (21 mg total) onto the skin daily. Apply 21 mg patch daily x 6 wk, then '14mg'$  patch daily x 2 wk, then 7 mg patch daily x 2 wk   omeprazole (PRILOSEC) 40 MG capsule Take 1 capsule (40 mg total) by mouth daily. (Patient not taking: Reported on 07/27/2022)   ondansetron (ZOFRAN) 8 MG tablet Take 1 tablet (8 mg total) by mouth every 8 (eight) hours as needed for nausea or vomiting. Start on the third day after cisplatin. (Patient not taking: Reported on 07/12/2022)   prochlorperazine (COMPAZINE) 10 MG tablet Take 1 tablet (10 mg total) by mouth every 6 (six) hours as needed (Nausea or vomiting). (Patient not taking: Reported on 07/12/2022)   rivaroxaban (XARELTO) 20 MG TABS tablet Take 1 tablet (20 mg total) by mouth  daily with supper.   rosuvastatin (CRESTOR) 10 MG tablet Take 1 tablet (10 mg total) by mouth daily.   sodium chloride (OCEAN) 0.65 % SOLN nasal spray Place 1 spray into both nostrils as needed for congestion.   No facility-administered encounter medications on file as of 08/01/2022.   Call to [name] to follow up on status of Xarelto application for year 6811. Was  advised patient only qualifies fr the [employer] with me assistance. With that program if approved he would pay 85.00 per 30 DS of the medication. Call to patient he reports that he has not been notified of this from the Manufacturer but with his ins once in the donut hole his payment is only 45.00 fr the medication so he is not interested in applying for it at this time.   Care Gaps: Hep C Screening - Overdue TDAP  -Overdue Lung Cancer Screen 0 Overdue AWV- 06/07/21 Zoster Vaccine - Overdue BP- 120/67 07/31/22  Star Rating Drugs: Rosuvastatin 10 mg - Last filled 05/18/22 90 DS at [facility] Pharmacist Assistant [phone]

## 2022-08-01 NOTE — Patient Instructions (Signed)
Visit Information  Thank you for taking time to visit with me today. Please don't hesitate to contact me if I can be of assistance to you.   Following are the goals we discussed today:   Goals Addressed             This Visit's Progress    COMPLETED: care coordination activity       Care Coordination Interventions: Reviewed medications with patient and discussed adherence with all medications with no needed refills Reviewed scheduled/upcoming provider appointments including sufficient transportation source Assessed social determinant of health barriers Educated on care management services with no needs presented today.         Please call the care guide team at [phone] if you need to cancel or reschedule your appointment.   If you are experiencing a Mental Health or [place] or need someone to talk to, please call the Suicide and Crisis Lifeline: 988  Patient verbalizes understanding of instructions and care plan provided today and agrees to view in [place]. Active MyChart status and patient understanding of how to access instructions and care plan via MyChart confirmed with patient.     No further follow up required: No follow up needs  Raina Mina, RN Care Management Coordinator [place] Office [phone]

## 2022-08-01 NOTE — Telephone Encounter (Signed)
Attempted to call patient to notify of new upcoming appointment. Mailing reminder, call was dropped.

## 2022-08-01 NOTE — Patient Outreach (Signed)
  Care Coordination   Initial Visit Note   08/01/2022 Name: Cory [name] MRN: [id number] DOB: [birth year]  Cory [name] is a 76 y.o. year old male who sees Burchette, Alinda Sierras, MD for primary care. I spoke with  [name] by phone today.  What matters to the patients health and wellness today?  No needs    Goals Addressed             This Visit's Progress    COMPLETED: care coordination activity       Care Coordination Interventions: Reviewed medications with patient and discussed adherence with all medications with no needed refills Reviewed scheduled/upcoming provider appointments including sufficient transportation source Assessed social determinant of health barriers Educated on care management services with no needs presented today.         SDOH assessments and interventions completed:  Yes  SDOH Interventions Today    Flowsheet Row Most Recent Value  SDOH Interventions   Food Insecurity Interventions Intervention Not Indicated  Housing Interventions Intervention Not Indicated  Transportation Interventions Intervention Not Indicated  Utilities Interventions Intervention Not Indicated        Care Coordination Interventions:  Yes, provided   Follow up plan: No further intervention required.   Encounter Outcome:  Pt. Visit Completed   Raina Mina, RN Care Management Coordinator [place] Office [phone]

## 2022-08-02 ENCOUNTER — Ambulatory Visit: Payer: Medicare HMO | Attending: Neurology | Admitting: Physical Therapy

## 2022-08-02 ENCOUNTER — Ambulatory Visit: Payer: Medicare HMO

## 2022-08-02 ENCOUNTER — Encounter: Payer: Self-pay | Admitting: Physical Therapy

## 2022-08-02 DIAGNOSIS — R293 Abnormal posture: Secondary | ICD-10-CM | POA: Diagnosis not present

## 2022-08-02 DIAGNOSIS — R1310 Dysphagia, unspecified: Secondary | ICD-10-CM

## 2022-08-02 DIAGNOSIS — C321 Malignant neoplasm of supraglottis: Secondary | ICD-10-CM | POA: Diagnosis not present

## 2022-08-02 NOTE — Therapy (Signed)
OUTPATIENT SPEECH LANGUAGE PATHOLOGY TREATMENT   Patient Name: Cory [name] MRN: [id number] DOB:Jan 29, 1946, 76 y.o., male Today's Date: 08/02/2022  PCP: Carolann Littler, MD REFERRING PROVIDER: [name], MD   End of Session - 08/02/22 1148     Visit Number 3    Number of Visits 7    Date for SLP Re-Evaluation 08/22/22    SLP Start Time 1107    SLP Stop Time  1133    SLP Time Calculation (min) 26 min    Activity Tolerance Patient tolerated treatment well               Past Medical History:  Diagnosis Date   Aortic stenosis    a. Moderate by echo 05/2020.   Arthritis    right hip   Cancer ([place])    skin cancer nose, right left, upper left basel cell   Dysrhythmia    a-fib   PAF (paroxysmal atrial fibrillation) (HCC)    Peripheral arterial disease (HCC)    Tobacco abuse    Past Surgical History:  Procedure Laterality Date   ABDOMINAL AORTIC ENDOVASCULAR STENT GRAFT N/A 08/29/2020   Procedure: ABDOMINAL AORTIC ENDOVASCULAR STENT GRAFT;  Surgeon: Elam Dutch, MD;  Location: [place];  Service: Vascular;  Laterality: N/A;   ABDOMINAL AORTOGRAM W/LOWER EXTREMITY Bilateral 06/24/2020   Procedure: ABDOMINAL AORTOGRAM W/LOWER EXTREMITY;  Surgeon: Elam Dutch, MD;  Location: [place] [facility];  Service: Cardiovascular;  Laterality: Bilateral;   BACK SURGERY     2005   BIOPSY  05/21/2022   Procedure: BIOPSY;  Surgeon: Rush Landmark Telford Nab., MD;  Location: [facility];  Service: Gastroenterology;;   CERVICAL SPINE SURGERY     DIRECT LARYNGOSCOPY Bilateral 04/18/2022   Procedure: DIRECT LARYNGOSCOPY WITH BIOPSIES;  Surgeon: Melida Quitter, MD;  Location: [place];  Service: ENT;  Laterality: Bilateral;   EMBOLECTOMY Right 08/29/2020   Procedure: POPLITEAL AND TIBIAL EMBOLECTOMY;  Surgeon: Elam Dutch, MD;  Location: [place];  Service: Vascular;  Laterality: Right;   EMBOLIZATION Right 06/24/2020   Procedure: EMBOLIZATION;  Surgeon: Elam Dutch, MD;   Location: [place] [facility];  Service: Cardiovascular;  Laterality: Right;   ENDARTERECTOMY FEMORAL Right 08/29/2020   Procedure: ENDARTERECTOMY FEMORAL;  Surgeon: Elam Dutch, MD;  Location: [facility];  Service: Vascular;  Laterality: Right;   ESOPHAGOGASTRODUODENOSCOPY N/A 06/27/2022   Procedure: ESOPHAGOGASTRODUODENOSCOPY (EGD);  Surgeon: Irving Copas., MD;  Location: [facility];  Service: Gastroenterology;  Laterality: N/A;   ESOPHAGOGASTRODUODENOSCOPY (EGD) WITH PROPOFOL N/A 05/21/2022   Procedure: ESOPHAGOGASTRODUODENOSCOPY (EGD) WITH PROPOFOL;  Surgeon: Rush Landmark Telford Nab., MD;  Location: [facility];  Service: Gastroenterology;  Laterality: N/A;   EUS N/A 05/21/2022   Procedure: UPPER ENDOSCOPIC ULTRASOUND (EUS) RADIAL;  Surgeon: Irving Copas., MD;  Location: [facility];  Service: Gastroenterology;  Laterality: N/A;   EUS N/A 06/27/2022   Procedure: UPPER ENDOSCOPIC ULTRASOUND (EUS) RADIAL;  Surgeon: Irving Copas., MD;  Location: [facility];  Service: Gastroenterology;  Laterality: N/A;   EYE SURGERY     FEMORAL ARTERY EXPLORATION Right 06/25/2020   Procedure: RIGHT GROIN EXPLORATION Evacuation of Hematoma  WITH REPAIR OF RIGHT Common FEMORAL ARTERY.;  Surgeon: Angelia Mould, MD;  Location: [place];  Service: Vascular;  Laterality: Right;   FEMORAL-POPLITEAL BYPASS GRAFT Left 11/22/2020   Procedure: LEFT ABOVE KNEE-BELOW KNEE BYPASS TIBIAL PERITONEAL TRUNK REVERSE IPSILATERAL GREATER SAPHENOUS VEIN;  Surgeon: Elam Dutch, MD;  Location: [place];  Service: Vascular;  Laterality:  Left;   FINE NEEDLE ASPIRATION N/A 05/21/2022   Procedure: FINE NEEDLE ASPIRATION (FNA) LINEAR;  Surgeon: Irving Copas., MD;  Location: [facility];  Service: Gastroenterology;  Laterality: N/A;   FINE NEEDLE ASPIRATION  06/27/2022   Procedure: FINE NEEDLE ASPIRATION;  Surgeon: Rush Landmark Telford Nab., MD;  Location: [facility];  Service:  Gastroenterology;;   HOT HEMOSTASIS N/A 06/27/2022   Procedure: HOT HEMOSTASIS (ARGON PLASMA COAGULATION/BICAP);  Surgeon: Irving Copas., MD;  Location: [facility];  Service: Gastroenterology;  Laterality: N/A;   IR GASTROSTOMY TUBE MOD SED  06/12/2022   IR IMAGING GUIDED PORT INSERTION  06/12/2022   LEG SURGERY     Trauma   LOWER EXTREMITY ANGIOGRAPHY Left 11/17/2020   Procedure: Lower Extremity Angiography;  Surgeon: Marty Heck, MD;  Location: [place] [facility];  Service: Cardiovascular;  Laterality: Left;   PATCH ANGIOPLASTY Right 08/29/2020   Procedure: PATCH ANGIOPLASTY RIGHT BELOW KNEE POPLITEAL ARTERY AND RIGHT COMMON FEMORAL ARTERY;  Surgeon: Elam Dutch, MD;  Location: [facility] OR;  Service: Vascular;  Laterality: Right;   RETINAL DETACHMENT SURGERY     RIGID ESOPHAGOSCOPY  04/18/2022   Procedure: RIGID ESOPHAGOSCOPY;  Surgeon: Melida Quitter, MD;  Location: [place];  Service: ENT;;   TONSILLECTOMY  04/18/2022   Procedure: LEFT TONSILLECTOMY;  Surgeon: Melida Quitter, MD;  Location: [place];  Service: ENT;;   ULTRASOUND GUIDANCE FOR VASCULAR ACCESS Bilateral 08/29/2020   Procedure: ULTRASOUND GUIDANCE FOR VASCULAR ACCESS;  Surgeon: Elam Dutch, MD;  Location: [place];  Service: Vascular;  Laterality: Bilateral;   Patient Active Problem List   Diagnosis Date Noted   Mucositis due to chemotherapy 07/17/2022   Port-A-Cath in place 06/21/2022   Malignant neoplasm of supraglottis ([place]) 05/08/2022   Cancer of supraglottis ([place]) 05/04/2022   Rib pain on left side 12/01/2021   Chronic sore throat 08/15/2021   Dyslipidemia 07/17/2021   AAA (abdominal aortic aneurysm) ([place]) 08/29/2020   PAD (peripheral artery disease) ([place]) 06/25/2020   Educated about COVID-19 virus infection 05/12/2020   Tobacco abuse 05/12/2020   Peripheral vascular disease ([place]) 04/25/2020   Abdominal aortic aneurysm (AAA) ([place]) 04/25/2020   Essential hypertension 05/16/2018   Medication management  05/16/2018   Piriformis syndrome of right side 05/06/2018   Allergy 10/29/2017   Recurrent sinusitis 10/29/2017   Atrial fibrillation, transient ([place]) 06/07/2015   Aortic stenosis    ABNORMAL ELECTROCARDIOGRAM 07/14/2009    ONSET DATE: See "pertinent history"   REFERRING DIAG: Malignant neoplasm of supraglottis, Squamous cell, Stage IVA   THERAPY DIAG:  Dysphagia, unspecified type  Rationale for Evaluation and Treatment Rehabilitation  SUBJECTIVE:   SUBJECTIVE STATEMENT: Eaten pork roast, soft tacos, eggs and grits.   Pt accompanied by: self  PERTINENT HISTORY:  He presented to Dr. Redmond Baseman on 08/15/21 with c/o persistent left sided sore throat since August 2022. Laryngoscopy completed at this time was unremarkable. 11/08/21 CT neck revealed a sub-centimeter focus of mucosal hyperenhancement along the posterior aspect of the uvula, and a few tiny cystic appearing foci along the left glosso-tonsillar sulcus. CT also showed a nonspecific mildly enlarged right level 2 lymph node measuring 14 mm in the short axis, and multiple small calcific foci within the left palatine tonsil possibly reflective of post-inflammatory calcifications and/or tonsilloliths. 11/21/21 repeat Laryngoscopy by Dr. Redmond Baseman again was unremarkable. Due to persistent throat pain Dr. Redmond Baseman advised proceeding with a direct laryngoscopy and rigid esophagosopy with biopsies. 04/18/22 Left laryngeal biopsies showed invasive moderately differentiated SCC.  05/07/22 PET completed showing Asymmetric Hypermetabolism in the left oropharynx and involving the left epiglottis. Imaging features compatible with primary neoplasm. (I believe some of the oropharynx SUV uptake could be r/t abscess, recent biopsy), Contralateral (right-sided) hypermetabolic level II cervical lymph nodes compatible with metastatic disease. No hypermetabolic lymphadenopathy in the left neck, and 1.2 cm short axis aortocaval lymph node in the upper abdomen is  hypermetabolic. Metastatic disease is a concern. This lymph node has increased from 0.7 cm on a study from 2022. (I've messaged GI providers to see if this could be bx'd by endoscopy).  05/21/22 Biopsy of the lymph node in his abdomen  with CT simulation planned for 05/23/22. Treatment plan will depend on results of the biopsy on 10/16 but will most likely be weekly chemo and 35 fractions of radiation. Treatment plan:  He will receive 35 fractions of radiation and weekly chemotherapy to his larynx and bilateral neck.  He is scheduled to start on 05/30/22 and complete 07/19/22. PEG/PAC pending.  PAIN:  Are you having pain? No   PATIENT GOALS: Maintain WNL swallowing  OBJECTIVE:   TODAY'S TREATMENT: 08/02/22: "I really don't have any problems when I'm eating." Pt complains of xerostomia and of ageusia except for onion and garlic. Today pt ate cereal bar and drank water without overt s/sx of oral or pharyngeal deficts. Pt told SLP he has not completed HEP at prescribed frequency for the last 2-3 weeks. SLP reiterated rationale for frequency and for HEP in general and reminded pt about completing in this manner until 01/18/23, and then twice/three times a week after that. Pt completed today with SBA for tongue protrusion with [place]. If pt maintains progress next visit, consider every other month  06/21/22: Pt not performing HEP at prescribed frequency. SLP reminded pt at least x10 reps twice a day was target completion. Pt told SLP rationale for HEP without cues. Procedure with HEP today was WNL - pt was independent. SLP explained/reminded pt to cycle through HEP if he needs to do so before next ST appointment. SLP educated pt on overt s/sx aspiration PNA.  Pt ate ham sandwich and drank water without any overt s/sx oral or pharyngeal deficits; he reports no overt s/sx dysphagia with POs to date.    05/24/22: Research states the risk for dysphagia increases due to radiation and/or chemotherapy treatment  due to a variety of factors, so SLP educated the pt about the possibility of reduced/limited ability for PO intake during rad tx. SLP also educated pt regarding possible changes to swallowing musculature after rad tx, and why adherence to dysphagia HEP provided today and PO consumption was necessary to inhibit muscle fibrosis following rad tx and to mitigate muscle disuse atrophy. SLP informed pt why this would be detrimental to their swallowing status and to their pulmonary health. Pt demonstrated understanding of these things to SLP. SLP encouraged pt to safely eat and drink as deep into their radiation/chemotherapy as possible to provide the best possible long-term swallowing outcome for pt.    SLP then developed an individualized HEP for pt involving oral and pharyngeal and vocal  strengthening and ROM and pt was instructed how to perform these exercises, including SLP demonstration. After SLP demonstration, pt return demonstrated each exercise. SLP ensured pt performance was correct prior to educating pt on next exercise. Pt required min cues faded to modified independent to perform HEP. Pt was instructed to complete this program 6-7 days/week, at least 2 times a day until 6 months after  his or her last day of rad tx, and then x2 a week after that, indefinitely. Modifications for days when pt cannot perform HEP as prescribed include cycle through the full program of exercises instead of fatiguing on one of the swallowing exercises and being unable to perform the others. SLP instructed that swallowing exercises should then be increased back as pt is able to do so. Secondly, pt was told that former patients have told SLP that during their course of radiation therapy, taking prescribed pain medication just prior to performing HEP (and eating/drinking) has proven helpful in completing HEP (and eating and drinking) more regularly when going through their course of radiation treatment.   PATIENT  EDUCATION: Education details: late effects head/neck radiation on swallow function, HEP procedure, and rationale for BID-TID 6 days/week, modification to frequency after 01/18/23 Person educated: Patient Education method: Consulting civil engineer, Demonstration, Verbal cues, and Handouts Education comprehension: verbalized understanding, returned demonstration, verbal cues required, and needs further education   ASSESSMENT:  CLINICAL IMPRESSION: Patient is a 76 y.o. male who was seen today for treatment of swallowing after undergoing radiation/chemoradiation therapy. Today pt ate items from [facility] [employer] and drank thin liquids without overt s/s oral or pharyngeal difficulty. At this time pt swallowing is deemed WNL/WFL with these POs. No oral or overt s/sx pharyngeal deficits, including aspiration were observed. Pt is not completing HEP to prescribed frequency. There are no overt s/s aspiration PNA observed by SLP nor any reported by pt at this time. Data indicate that pt's swallow ability will likely decrease over the course of radiation/chemoradiation therapy and could very well decline over time following the conclusion of that therapy due to muscle disuse atrophy and/or muscle fibrosis. Pt will cont to need to be seen by SLP in order to assess safety of PO intake, assess the need for recommending any objective swallow assessment, and ensuring pt is correctly completing the individualized HEP.  OBJECTIVE IMPAIRMENTS: include dysphagia. These impairments are limiting patient from safety when swallowing. Factors affecting potential to achieve goals and functional outcome are  none . Patient will benefit from skilled SLP services to address above impairments and improve overall function.  REHAB POTENTIAL: Good    GOALS: Goals reviewed with patient? No   SHORT TERM GOALS: Target date:  2 therapy visits (visit #3)       pt will complete HEP with rare min A  Baseline: Goal status: Met   2.  pt will tell SLP why  pt is completing HEP with modified independence Baseline:  Goal status: Met   3.  pt will describe 3 overt s/s aspiration PNA with modified independence Baseline:  Goal status: Met     LONG TERM GOALS: Target date:  6 therapy visits (visit #7)      pt will complete HEP with modified independnence in 2 sessions Baseline: 08/02/22 Goal status: Ongoing   2.  pt will describe how to modify HEP over time, and the timeline associated with reduction in HEP frequency with modified independence over two sessions Baseline:  Goal status: Ongoing     PLAN: SLP FREQUENCY:  once every approx 4 weeks   SLP DURATION:  7 total sessions   PLANNED INTERVENTIONS: Aspiration precaution training, Pharyngeal strengthening exercises, Diet toleration management , Trials of upgraded texture/liquids, Internal/external aids, SLP instruction and feedback, Compensatory strategies, and Patient/family education     [facility] [place] [facility] 08/02/2022, 11:48 AM

## 2022-08-02 NOTE — Therapy (Signed)
OUTPATIENT PHYSICAL THERAPY HEAD AND NECK POST RADIATION FOLLOW UP   Patient Name: Cory [name] MRN: [id number] DOB:01/18/1946, 76 y.o., male Today's Date: 08/02/2022  END OF SESSION:  PT End of Session - 08/02/22 1033     Visit Number 2    Number of Visits 2    Date for PT Re-Evaluation 08/02/22    PT Start Time 1007    PT Stop Time 1025    PT Time Calculation (min) 18 min    Activity Tolerance Patient tolerated treatment well    Behavior During Therapy WFL for tasks assessed/performed             Past Medical History:  Diagnosis Date   Aortic stenosis    a. Moderate by echo 05/2020.   Arthritis    right hip   Cancer ([place])    skin cancer nose, right left, upper left basel cell   Dysrhythmia    a-fib   PAF (paroxysmal atrial fibrillation) (HCC)    Peripheral arterial disease (HCC)    Tobacco abuse    Past Surgical History:  Procedure Laterality Date   ABDOMINAL AORTIC ENDOVASCULAR STENT GRAFT N/A 08/29/2020   Procedure: ABDOMINAL AORTIC ENDOVASCULAR STENT GRAFT;  Surgeon: Elam Dutch, MD;  Location: [facility];  Service: Vascular;  Laterality: N/A;   ABDOMINAL AORTOGRAM W/LOWER EXTREMITY Bilateral 06/24/2020   Procedure: ABDOMINAL AORTOGRAM W/LOWER EXTREMITY;  Surgeon: Elam Dutch, MD;  Location: [facility];  Service: Cardiovascular;  Laterality: Bilateral;   BACK SURGERY     2005   BIOPSY  05/21/2022   Procedure: BIOPSY;  Surgeon: Rush Landmark Telford Nab., MD;  Location: [facility];  Service: Gastroenterology;;   CERVICAL SPINE SURGERY     DIRECT LARYNGOSCOPY Bilateral 04/18/2022   Procedure: DIRECT LARYNGOSCOPY WITH BIOPSIES;  Surgeon: Melida Quitter, MD;  Location: [facility];  Service: ENT;  Laterality: Bilateral;   EMBOLECTOMY Right 08/29/2020   Procedure: POPLITEAL AND TIBIAL EMBOLECTOMY;  Surgeon: Elam Dutch, MD;  Location: [facility];  Service: Vascular;  Laterality: Right;   EMBOLIZATION Right 06/24/2020   Procedure: EMBOLIZATION;  Surgeon:  Elam Dutch, MD;  Location: [facility];  Service: Cardiovascular;  Laterality: Right;   ENDARTERECTOMY FEMORAL Right 08/29/2020   Procedure: ENDARTERECTOMY FEMORAL;  Surgeon: Elam Dutch, MD;  Location: [facility];  Service: Vascular;  Laterality: Right;   ESOPHAGOGASTRODUODENOSCOPY N/A 06/27/2022   Procedure: ESOPHAGOGASTRODUODENOSCOPY (EGD);  Surgeon: Irving Copas., MD;  Location: [facility];  Service: Gastroenterology;  Laterality: N/A;   ESOPHAGOGASTRODUODENOSCOPY (EGD) WITH PROPOFOL N/A 05/21/2022   Procedure: ESOPHAGOGASTRODUODENOSCOPY (EGD) WITH PROPOFOL;  Surgeon: Rush Landmark Telford Nab., MD;  Location: [facility];  Service: Gastroenterology;  Laterality: N/A;   EUS N/A 05/21/2022   Procedure: UPPER ENDOSCOPIC ULTRASOUND (EUS) RADIAL;  Surgeon: Irving Copas., MD;  Location: [facility];  Service: Gastroenterology;  Laterality: N/A;   EUS N/A 06/27/2022   Procedure: UPPER ENDOSCOPIC ULTRASOUND (EUS) RADIAL;  Surgeon: Irving Copas., MD;  Location: [facility];  Service: Gastroenterology;  Laterality: N/A;   EYE SURGERY     FEMORAL ARTERY EXPLORATION Right 06/25/2020   Procedure: RIGHT GROIN EXPLORATION Evacuation of Hematoma  WITH REPAIR OF RIGHT Common FEMORAL ARTERY.;  Surgeon: Angelia Mould, MD;  Location: [facility] OR;  Service: Vascular;  Laterality: Right;   FEMORAL-POPLITEAL BYPASS GRAFT Left 11/22/2020   Procedure: LEFT ABOVE KNEE-BELOW KNEE BYPASS TIBIAL PERITONEAL TRUNK REVERSE IPSILATERAL GREATER SAPHENOUS VEIN;  Surgeon: Elam Dutch, MD;  Location: [facility]  OR;  Service: Vascular;  Laterality: Left;   FINE NEEDLE ASPIRATION N/A 05/21/2022   Procedure: FINE NEEDLE ASPIRATION (FNA) LINEAR;  Surgeon: Irving Copas., MD;  Location: [facility];  Service: Gastroenterology;  Laterality: N/A;   FINE NEEDLE ASPIRATION  06/27/2022   Procedure: FINE NEEDLE ASPIRATION;  Surgeon: Rush Landmark Telford Nab., MD;  [facility];  Service: Gastroenterology;;   HOT HEMOSTASIS N/A 06/27/2022   Procedure: HOT HEMOSTASIS (ARGON PLASMA COAGULATION/BICAP);  Surgeon: Irving Copas., MD;  Location: [facility];  Service: Gastroenterology;  Laterality: N/A;   IR GASTROSTOMY TUBE MOD SED  06/12/2022   IR IMAGING GUIDED PORT INSERTION  06/12/2022   LEG SURGERY     Trauma   LOWER EXTREMITY ANGIOGRAPHY Left 11/17/2020   Procedure: Lower Extremity Angiography;  Surgeon: Marty Heck, MD;  Location: [place] [facility];  Service: Cardiovascular;  Laterality: Left;   PATCH ANGIOPLASTY Right 08/29/2020   Procedure: PATCH ANGIOPLASTY RIGHT BELOW KNEE POPLITEAL ARTERY AND RIGHT COMMON FEMORAL ARTERY;  Surgeon: Elam Dutch, MD;  Location: [facility] OR;  Service: Vascular;  Laterality: Right;   RETINAL DETACHMENT SURGERY     RIGID ESOPHAGOSCOPY  04/18/2022   Procedure: RIGID ESOPHAGOSCOPY;  Surgeon: Melida Quitter, MD;  Location: [place];  Service: ENT;;   TONSILLECTOMY  04/18/2022   Procedure: LEFT TONSILLECTOMY;  Surgeon: Melida Quitter, MD;  Location: [facility];  Service: ENT;;   ULTRASOUND GUIDANCE FOR VASCULAR ACCESS Bilateral 08/29/2020   Procedure: ULTRASOUND GUIDANCE FOR VASCULAR ACCESS;  Surgeon: Elam Dutch, MD;  Location: [place];  Service: Vascular;  Laterality: Bilateral;   Patient Active Problem List   Diagnosis Date Noted   Mucositis due to chemotherapy 07/17/2022   Port-A-Cath in place 06/21/2022   Malignant neoplasm of supraglottis ([place]) 05/08/2022   Cancer of supraglottis ([place]) 05/04/2022   Rib pain on left side 12/01/2021   Chronic sore throat 08/15/2021   Dyslipidemia 07/17/2021   AAA (abdominal aortic aneurysm) ([place]) 08/29/2020   PAD (peripheral artery disease) ([place]) 06/25/2020   Educated about COVID-19 virus infection 05/12/2020   Tobacco abuse 05/12/2020   Peripheral vascular disease ([place]) 04/25/2020   Abdominal aortic aneurysm (AAA) ([place]) 04/25/2020   Essential hypertension 05/16/2018    Medication management 05/16/2018   Piriformis syndrome of right side 05/06/2018   Allergy 10/29/2017   Recurrent sinusitis 10/29/2017   Atrial fibrillation, transient ([place]) 06/07/2015   Aortic stenosis    ABNORMAL ELECTROCARDIOGRAM 07/14/2009    PCP: Carolann Littler, MD   REFERRING PROVIDER: [name], MD  REFERRING DIAG: C32.1 (ICD-10-CM) - Malignant neoplasm of supraglottis ([place])    THERAPY DIAG:  Abnormal posture  Malignant neoplasm of supraglottis ([place])  Rationale for Evaluation and Treatment: Rehabilitation  ONSET DATE: 11/08/21  SUBJECTIVE:  SUBJECTIVE STATEMENT: I have not noticed any swelling. I still feel tired after the treatments. I have a lot of fatigue and not much endurance. I have been walking every day for 15-30 min.   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 He presented to Dr. Redmond Baseman on 08/15/21 with c/o persistent left sided sore throat since August 2022. Laryngoscopy completed at this time was unremarkable. 11/08/21 CT neck revealed a sub-centimeter focus of mucosal hyperenhancement along the posterior aspect of the uvula, and a few tiny cystic appearing foci along the left glosso-tonsillar sulcus. CT also showed a nonspecific mildly enlarged right level 2 lymph node measuring 14 mm in the short axis, and multiple small calcific foci within the left palatine tonsil possibly reflective of postinflammatory calcifications and/or tonsilloliths. 11/21/21 repeat Laryngoscopy by Dr. Redmond Baseman again was unremarkable. Due to persistent throat pain Dr. Redmond Baseman advised proceeding with a direct larynscopy and rigid esophagosopy with biopsies.  04/18/22 Left laryngeal biopsies showed invasive moderately differentiated SCC. 05/07/22 PET completed showing  Asymmetric Hypermetabolism in the left oropharynx and involving the left epiglottis. Imaging features compatible with primary neoplasm. (I believe some of the oropharynx SUV uptake could be r/t abscess, recent biopsy), Contralateral (right-sided) hypermetabolic level II cervical lymph nodes compatible with metastatic disease. No hypermetabolic lymphadenopathy in the left neck, and 1.2 cm short axis aortocaval lymph node in the upper abdomen is hypermetabolic. Metastatic disease is a concern. This lymph node has increased from 0.7 cm on a study from 2022. 05/21/22 Biopsy of the lymph node in his abdomen with CT simulation planned for 05/23/22. Treatment plan will depend on results of the biopsy on 10/16 but will most likely be weekly chemo and 35 fractions of radiation. He will receive 35 fractions of radiation and weekly chemotherapy to his larynx and bilateral neck.  He is scheduled to start on 05/30/22 and complete 07/19/22  PATIENT GOALS:  Reassess how my recovery is going related to neck ROM, cervical pain, fatigue, and swelling.  PAIN:  Are you having pain? No  PRECAUTIONS: Recent radiation, Head and neck lymphedema risk,    OBJECTIVE:   POSTURE:  Forward head and rounded shoulders posture  30 SEC SIT TO STAND: 11 reps in 30 sec without use of UEs which is  between average and below average for patient's age. At eval pt was able to complete 12 reps in 30 sec without use of UEs which is  Average for patient's age.   SHOULDER AROM:   [facility]  CERVICAL AROM:     Percent limited 08/02/22  Flexion WFL WFL  Extension [facility] WFL  Right lateral flexion [facility] WFL  Left lateral flexion WFL 30% limited  Right rotation 25% limited WFL  Left rotation 25% limited WFL                          (Blank rows=not tested)  LYMPHEDEMA ASSESSMENT:    Circumference in cm  4 cm superior to sternal notch around neck 39.6  6 cm superior to sternal notch around neck 40.9  8 cm superior to sternal notch around  neck 41  R lateral nostril from base of nose to medial tragus   L lateral nostril from base of nose to medial tragus   R corner of mouth to where ear lobe meets face   L corner of mouth to where ear lobe meets face         (Blank  rows=not tested)  CURRENT/PAST TREATMENTS:  Surgery type/date: Several biopsies in Oct 2023  Chemotherapy: Completed 07/19/22  Radiation: Completed 07/19/22  OTHER SYMPTOMS: Pain No Fibrosis No Pitting edema No Infections No Decreased scar mobility No  PATIENT EDUCATION:  Education details: importance of walking program, continuing to do head and neck HEP for at least 6 months to decrease tightness, work on sit to stands and avoid adduction when going from sit to stand Person educated: Patient Education method: Consulting civil engineer, Media planner, and Handouts Education comprehension: verbalized understanding  HOME EXERCISE PROGRAM: Reviewed previously given post op HEP. Sit to stands while avoiding adduction   ASSESSMENT:  CLINICAL IMPRESSION: Pt returns to PT after completion of radiation and chemotherapy for treatment of cancer of supraglottis. He has returned to baseline ROM with the exception of L cervical lateral flexion which is around 30 percent limited. Pt reports he has not been stretching. Educated pt on the importance of daily stretching since the effects of radiation continue up to 6 months after the last treatment. Pt reports he has been fatigued since completion of treatment but this has been improving slowly. He has been walking daily. Therapist noted that pt adducts when going from sit to stand so educated pt about this and he was able to demonstrate sit to stand while maintaining proper distance between his knees and reported he had less discomfort in his hips with this. He does not feel he needs therapy at this time. His hip flexor and abductor strength was grossly 5/5 today. Educated pt that if he does not continue to improve or continues to  feel increased fatigue to let his doctor know so he can be referred back to therapy. He will be discharged from skilled PT services at this time.   Pt will benefit from skilled therapeutic intervention to improve on the following deficits: decreased knowledge of condition and postural dysfunction  PT treatment/interventions: ADL/Self care home management,      GOALS: Goals reviewed with patient? Yes  LONG TERM GOALS:  (STG=LTG) GOALS Name Target Date  Goal status  1 Pt will demonstrate a return to baseline cervical ROM measurements and not demonstrate any signs or symptoms of lymphedema. Baseline: 08/02/22 MET all have returned to baseline except L lateral flexion but pt has not been stretching this so educated him to begin stretching        PLAN:  PT FREQUENCY/DURATION: d/c this visit   PLAN FOR NEXT SESSION: d/c this visit   [facility]  [address], [address]  [place] [address]  [phone]   Scar massage You can begin gentle scar massage to you incision sites. Gently place one hand on the incision and move the skin (without sliding on the skin) in various directions. Do this for a few minutes and then you can gently massage either coconut oil or vitamin E cream into the scars.  Home exercise Program Continue doing the exercises you were given until you feel like you can do them without feeling any tightness at the end. It is best to do them for several months after completion of radiation since the effects of radiation continue past completion.   Walking Program Studies show that 30 minutes of walking per day (fast enough to elevate your heart rate) can significantly reduce the risk of a cancer recurrence. If you can't walk due to other medical reasons, we encourage you to find another activity you could do (like a stationary bike or water exercise).  Posture  After treatment for head and neck cancer, people frequently sit with rounded  shoulders and forward head posture because the front of the neck has become tight and it feels better. If you sit like this, you can become very tight and have pain in sitting or standing with good posture. Try to be aware of your posture and sit and stand up tall to heal properly.  Follow up PT: Please let you doctor know as soon as possible if you develop any swelling in your face or neck in the future. Lymphedema (swelling) can occur months after completion of radiation. The sooner you can let the doctor know, the sooner they can refer you back to PT. It is much easier to treat the swelling early on.   Allyson Sabal [place], PT 08/02/2022, 10:36 AM   PHYSICAL THERAPY DISCHARGE SUMMARY  Visits from Start of Care: 2  Current functional level related to goals / functional outcomes: All goals met   Remaining deficits: None   Education / Equipment: HEP, walking program   Patient agrees to discharge. Patient goals were met. Patient is being discharged due to meeting the stated rehab goals.  Allyson Sabal [place], [place] 08/02/22 10:37 AM

## 2022-08-04 ENCOUNTER — Other Ambulatory Visit: Payer: Self-pay

## 2022-08-07 ENCOUNTER — Ambulatory Visit: Payer: Medicare HMO | Admitting: Dietician

## 2022-08-07 NOTE — Progress Notes (Signed)
Nutrition Follow-up:  Patient has completed concurrent chemoradiation therapy for SCC of supraglottis. S/p PEG 11/7.  Completed nutrition follow-up via telephone secondary to patient not feeling well. Patient reports getting over a cold, but otherwise he is doing well. He has been eating well and says some of his taste is returning (onions, garlic, lemon). Patient continues to have issues with dry mouth. He denies dysphagia. Patient is giving one carton Osmolite 1.5 via tube daily. He would like to have tube removed.    Medications: reviewed   Labs: 12/26 glucose 115, albumin 3.4  Anthropometrics: Pt reports he is maintaining weights, recalls 214 lb on home scale   12/22 - 212 lb 12.8 oz  11/28 - 212 lb 11.2 oz    NUTRITION DIAGNOSIS: Inadequate oral intake appears improved    INTERVENTION:  Encouraged increased oral intake as tolerated of soft moist high protein foods Pt will discontinue tube feeding  Educated to continue daily water flush If pt able to maintain weights without using tube x3 weeks, would recommend discontinuing PEG    MONITORING, EVALUATION, GOAL: weight trends, intake, tube feeding   NEXT VISIT: Thursday January 25 for wt check

## 2022-08-08 ENCOUNTER — Telehealth: Payer: Self-pay | Admitting: Pharmacist

## 2022-08-08 NOTE — Progress Notes (Signed)
A user error has taken place: encounter opened in error, closed for administrative reasons.

## 2022-08-09 ENCOUNTER — Other Ambulatory Visit: Payer: Self-pay

## 2022-08-10 NOTE — Radiation Completion Notes (Signed)
Patient Name: CORDARREL, STIEFEL MRN: [id number] Date of Birth: 1945/09/20 Referring Physician: Melida Quitter, M.D. Date of Service: 2022-08-10 Radiation Oncologist: [name], M.D. [place]                             Radiation Oncology End of Treatment Note     Diagnosis: C32.1 Malignant neoplasm of supraglottis Staging on 2022-05-08: Malignant neoplasm of supraglottis (HCC) T=cT1, N=cN2c, M=cM0 Intent: Curative     ==========DELIVERED PLANS==========  First Treatment Date: 2022-05-30 - Last Treatment Date: 2022-07-18   Plan Name: HN_supragl Site: Larynx Technique: IMRT Mode: Photon Dose Per Fraction: 2 Gy Prescribed Dose (Delivered / Prescribed): 70 Gy / 70 Gy Prescribed Fxs (Delivered / Prescribed): 35 / 35     ==========ON TREATMENT VISIT DATES========== 2022-06-04, 2022-06-11, 2022-06-18, 2022-06-25, 2022-07-02, 2022-07-09, 2022-07-16     ==========UPCOMING VISITS==========       ==========APPENDIX - ON TREATMENT VISIT NOTES==========   PatEd 2022-06-01 Ongoing education performed.   ImpPlan 2022-06-01 The patient is tolerating radiation. Continue treatment as planned.   PhysExam 2022-06-01 Alert, no acute distress.   ImpPlan 2022-06-04 The patient is tolerating radiation. Continue treatment as planned.   PatEd 2022-06-04 Ongoing education performed.   PhysExam 2022-06-04 Alert, no acute distress.   ProgNote 2022-06-04 Changes from last week/visit? [ No ] Pain? [ yes, sore throat ] Dysphagia? [ No ] Thick saliva/mouth irritation? [ No ] Mouth ulcers? [ No ] PEG tube? Any issues? [ na ] Have they received chemo (at any point during their radiation treatment)? [ Yes, last treatment was [place] ] Taking anything by mouth or all via PEG? [  ] How much clear fluid are they taking in? [ 64 oz ] Are they doing their salt/baking soda rinses? [ No - nursing reinforced patient education ] Need refill on lotions? [ No ] Need refills:  [ No ] Additional  Weekly Progress Notes [ doing well overall. no needs.  ]    RunningNotes 2022-06-04 06-04-22- education performed   PatEd 2022-06-11 Ongoing education performed.   ImpPlan 2022-06-11 The patient is tolerating radiation. Continue treatment as planned.   PhysExam 2022-06-11 Alert, no acute distress.   ProgNote 2022-06-11 Changes from last week/visit? [ No ] Pain? [ No ] Dysphagia? [ No ] Thick saliva/mouth irritation? [ No ] Mouth ulcers? [ No ] PEG tube? Any issues? [ na ] Have they received chemo (at any point during their radiation treatment)? [ Yes, last friday was last treatment ] Taking anything by mouth or all via PEG? [ na ] How much clear fluid are they taking in? [ Evyn.Fought ] Are they doing their salt/baking soda rinses? [ Yes ] Need refill on lotions? [ Yes ] Need refills: [  ] Additional  Weekly Progress Notes [ none at this time. has gained weight.  ]    PatEd 2022-06-18 Ongoing education performed.   ImpPlan 2022-06-18 The patient is tolerating radiation. Continue treatment as planned.   PhysExam 2022-06-18 Alert, no acute distress.   ProgNote 2022-06-18 Changes from last week/visit? [ Yes ] Pain? [ Yes, sore throat, pain around peg tube ] Dysphagia? [ Yes, mild swallowing concerns remain but able able eat and drink ] Thick saliva/mouth irritation? [ Yes ] Mouth ulcers? [ No ] PEG tube? Any issues? [ pain around the peg ] Have they received chemo (at any point during their radiation treatment)? [ Yes, last friday was last  tx ] Taking anything by mouth or all via PEG? [ Yes - liquids only, just flush ] How much clear fluid are they taking in? [ 48oz or more ] Are they doing their salt/baking soda rinses? [ Yes ] Need refill on lotions? [ Yes ] Need refills: [ No ] Additional  Weekly Progress Notes [ just peg tube concerns ]    PatEd 2022-06-24 Ongoing education performed.   ImpPlan 2022-06-24 The patient is tolerating radiation. Continue treatment  as planned.   PhysExam 2022-06-24 Alert, no acute distress.   PatEd 2022-06-25 Ongoing education performed.   ImpPlan 2022-06-25 The patient is tolerating radiation. Continue treatment as planned.   PhysExam 2022-06-25 Alert, no acute distress.   ProgNote 2022-06-25 Changes from last week/visit? [ no ] Pain? [ no ] Dysphagia? [ no issues ] Thick saliva/mouth irritation? [ No ] Mouth ulcers? [ no ] PEG tube? Any issues? [ yes, not using it, just flushing ] Have they received chemo (at any point during their radiation treatment)? [ Yes, last Friday was last treatment ] Taking anything by mouth or all via PEG? [ Yes - liquids only, just flushing ] How much clear fluid are they taking in? [ 64 oz of fluid a day  ] Are they doing their salt/baking soda rinses? [ Yes ] Need refill on lotions? [ Yes ] Need refills: [ No ] Additional  Weekly Progress Notes [ no major changes,  ]    ImpPlan 2022-07-02 The patient is tolerating radiation. Continue treatment as planned.   PhysExam 2022-07-02 Alert, no acute distress.   PatEd 2022-07-02 Ongoing education performed.   ProgNote 2022-07-02 Changes from last week/visit? [ Yes ] Pain? [ Yes- Skin tenderness, 4/10. ] Dysphagia? [ No ] Thick saliva/mouth irritation? [ Yes ] Mouth ulcers? [ No ] PEG tube? Any issues? [ No ] Have they received chemo (at any point during their radiation treatment)? [ Yes- on Friday's. ] Taking anything by mouth or all via PEG? [ Yes - solid and liquids ] How much clear fluid are they taking in? [ 110oz daily ] Are they doing their salt/baking soda rinses? [ No - nursing reinforced patient education ] Need refill on lotions? [ No ] Need refills: [ No ] Additional  Weekly Progress Notes [  ]    PatEd 2022-07-09 Ongoing education performed.   ImpPlan 2022-07-09 The patient is tolerating radiation. Continue treatment as planned.   PhysExam 2022-07-09 Alert, no acute distress.   ProgNote 2022-07-09 Changes  from last week/visit? [ No ] Pain? [ No ] Dysphagia? [ No ] Thick saliva/mouth irritation? [ Yes ] Mouth ulcers? [ No ] PEG tube? Any issues? [ Yes ] Have they received chemo (at any point during their radiation treatment)? [ Yes, week before last was last treatment ] Taking anything by mouth or all via PEG? [ Yes - liquids only, 2-3 osmolite a day ] How much clear fluid are they taking in? [ Evyn.Fought ] Are they doing their salt/baking soda rinses? [ Yes ] Need refill on lotions? [ Yes ] Need refills: [ No ] Additional  Weekly Progress Notes [ no major concerns this week ]    PatEd 2022-07-16 Ongoing education performed.   ImpPlan 2022-07-16 The patient is tolerating radiation. Continue treatment as planned.   PhysExam 2022-07-16 Alert, no acute distress.   ProgNote 2022-07-16 Changes from last week/visit? [ No ] Pain? [ No ] Dysphagia? [ No ] Thick saliva/mouth irritation? [ Yes ]  Mouth ulcers? [ No ] PEG tube? Any issues? [ Yes ] Have they received chemo (at any point during their radiation treatment)? [ No, has not had in two week, scheduled this E[place].Cane ] Taking anything by mouth or all via PEG? [ yes, osmolite at least twice a day, goal of 3 ] How much clear fluid are they taking in? [ 64 oz ] Are they doing their salt/baking soda rinses? [ Yes ] Need refill on lotions? [ Yes ] Need refills: [ No ] Additional  Weekly Progress Notes [ no major concerns this week ]

## 2022-08-23 ENCOUNTER — Telehealth: Payer: Self-pay

## 2022-08-23 DIAGNOSIS — Z8679 Personal history of other diseases of the circulatory system: Secondary | ICD-10-CM

## 2022-08-23 DIAGNOSIS — I739 Peripheral vascular disease, unspecified: Secondary | ICD-10-CM

## 2022-08-23 NOTE — Telephone Encounter (Signed)
Pt called c/o of LLE swelling, he had just finished cancer treatment.  Reviewed pt's chart, returned call for clarification, two identifiers used. Pt stated that the swelling in his L calf started approx 1 week ago. He denies any erythema, open wounds, or pain. Pt has 1 yr f/u in recall, so appts scheduled using that. Confirmed understanding.

## 2022-08-24 NOTE — Progress Notes (Signed)
HISTORY AND PHYSICAL     CC:  follow up. Requesting Provider:  [name], MD  HPI: This is a 77 y.o. male who  who, In January 2022, he underwent bifurcated aneurysm stent graft repair of a 4.6 cm right common iliac aneurysm and 3.8 cm infrarenal aneurysm.  He also had coil embolization of a 3.3 cm right internal iliac aneurysm.  This was complicated by a hematoma in his right groin after his coil embolization.  At the time of his stent graft repair he also required right common femoral endarterectomy and popliteal and tibial embolectomy on the right side.    On 11/22/2020, he underwent Left above-knee to below-knee popliteal/tibioperoneal trunk bypass with ipsilateral reversed left greater saphenous vein, intraoperative arteriogram by Dr. Oneida Alar.  He was discharged home on POD 2.      Pt was last seen 11/09/2021 and at that time, he was doing well without claudication, rest pain or non healing wounds.  He was not having any new or changing abdominal or back pain.  Duplex revealed that his right CIA aneurysm was continuing to decrease in size and bypass patent without stenosis.  His left DP pulse was palpable.   He was on Xarelto for afib.   The pt returns today for follow up.  He denies any cramping in his calves when walking, rest pain or non healing wounds.   He states he has an area of swelling below his incision on the left lower leg.  He states that it started about 2 weeks ago.  It is not painful.  He continues to have some numbness in the same area that is unchanged.  He has been wearing his compression sock, which has helped.    He states he is doing well.  He says that he has cut back on his smoking with the help of the patches.  He continues to work on this.   He states that he is going for his PET scan in March and hopeful to get his PEG tube out soon.    The pt is on a statin for cholesterol management.    The pt is not on an aspirin.    Other AC:  Xarelto The pt is on BB,  CCB for hypertension.  The pt does not have diabetes. Tobacco hx:  current   Past Medical History:  Diagnosis Date   Aortic stenosis    a. Moderate by echo 05/2020.   Arthritis    right hip   Cancer ([place])    skin cancer nose, right left, upper left basel cell   Dysrhythmia    a-fib   PAF (paroxysmal atrial fibrillation) (HCC)    Peripheral arterial disease (HCC)    Tobacco abuse     Past Surgical History:  Procedure Laterality Date   ABDOMINAL AORTIC ENDOVASCULAR STENT GRAFT N/A 08/29/2020   Procedure: ABDOMINAL AORTIC ENDOVASCULAR STENT GRAFT;  Surgeon: Elam Dutch, MD;  Location: [place];  Service: Vascular;  Laterality: N/A;   ABDOMINAL AORTOGRAM W/LOWER EXTREMITY Bilateral 06/24/2020   Procedure: ABDOMINAL AORTOGRAM W/LOWER EXTREMITY;  Surgeon: Elam Dutch, MD;  Location: [place] [facility];  Service: Cardiovascular;  Laterality: Bilateral;   BACK SURGERY     2005   BIOPSY  05/21/2022   Procedure: BIOPSY;  Surgeon: Rush Landmark Telford Nab., MD;  Location: [facility];  Service: Gastroenterology;;   CERVICAL SPINE SURGERY     DIRECT LARYNGOSCOPY Bilateral 04/18/2022   Procedure: DIRECT LARYNGOSCOPY WITH BIOPSIES;  Surgeon: Melida Quitter, MD;  Location: [place];  Service: ENT;  Laterality: Bilateral;   EMBOLECTOMY Right 08/29/2020   Procedure: POPLITEAL AND TIBIAL EMBOLECTOMY;  Surgeon: Elam Dutch, MD;  Location: [place];  Service: Vascular;  Laterality: Right;   EMBOLIZATION Right 06/24/2020   Procedure: EMBOLIZATION;  Surgeon: Elam Dutch, MD;  Location: [place] [facility];  Service: Cardiovascular;  Laterality: Right;   ENDARTERECTOMY FEMORAL Right 08/29/2020   Procedure: ENDARTERECTOMY FEMORAL;  Surgeon: Elam Dutch, MD;  Location: [facility];  Service: Vascular;  Laterality: Right;   ESOPHAGOGASTRODUODENOSCOPY N/A 06/27/2022   Procedure: ESOPHAGOGASTRODUODENOSCOPY (EGD);  Surgeon: Irving Copas., MD;  Location: [facility];  Service:  Gastroenterology;  Laterality: N/A;   ESOPHAGOGASTRODUODENOSCOPY (EGD) WITH PROPOFOL N/A 05/21/2022   Procedure: ESOPHAGOGASTRODUODENOSCOPY (EGD) WITH PROPOFOL;  Surgeon: Rush Landmark Telford Nab., MD;  Location: [facility];  Service: Gastroenterology;  Laterality: N/A;   EUS N/A 05/21/2022   Procedure: UPPER ENDOSCOPIC ULTRASOUND (EUS) RADIAL;  Surgeon: Irving Copas., MD;  Location: [facility];  Service: Gastroenterology;  Laterality: N/A;   EUS N/A 06/27/2022   Procedure: UPPER ENDOSCOPIC ULTRASOUND (EUS) RADIAL;  Surgeon: Irving Copas., MD;  Location: [facility];  Service: Gastroenterology;  Laterality: N/A;   EYE SURGERY     FEMORAL ARTERY EXPLORATION Right 06/25/2020   Procedure: RIGHT GROIN EXPLORATION Evacuation of Hematoma  WITH REPAIR OF RIGHT Common FEMORAL ARTERY.;  Surgeon: Angelia Mould, MD;  Location: [place];  Service: Vascular;  Laterality: Right;   FEMORAL-POPLITEAL BYPASS GRAFT Left 11/22/2020   Procedure: LEFT ABOVE KNEE-BELOW KNEE BYPASS TIBIAL PERITONEAL TRUNK REVERSE IPSILATERAL GREATER SAPHENOUS VEIN;  Surgeon: Elam Dutch, MD;  Location: [facility] OR;  Service: Vascular;  Laterality: Left;   FINE NEEDLE ASPIRATION N/A 05/21/2022   Procedure: FINE NEEDLE ASPIRATION (FNA) LINEAR;  Surgeon: Irving Copas., MD;  Location: [facility];  Service: Gastroenterology;  Laterality: N/A;   FINE NEEDLE ASPIRATION  06/27/2022   Procedure: FINE NEEDLE ASPIRATION;  Surgeon: Rush Landmark Telford Nab., MD;  Location: [facility];  Service: Gastroenterology;;   HOT HEMOSTASIS N/A 06/27/2022   Procedure: HOT HEMOSTASIS (ARGON PLASMA COAGULATION/BICAP);  Surgeon: Irving Copas., MD;  Location: [facility];  Service: Gastroenterology;  Laterality: N/A;   IR GASTROSTOMY TUBE MOD SED  06/12/2022   IR IMAGING GUIDED PORT INSERTION  06/12/2022   LEG SURGERY     Trauma   LOWER EXTREMITY ANGIOGRAPHY Left 11/17/2020   Procedure: Lower Extremity  Angiography;  Surgeon: Marty Heck, MD;  Location: [place] [facility];  Service: Cardiovascular;  Laterality: Left;   PATCH ANGIOPLASTY Right 08/29/2020   Procedure: PATCH ANGIOPLASTY RIGHT BELOW KNEE POPLITEAL ARTERY AND RIGHT COMMON FEMORAL ARTERY;  Surgeon: Elam Dutch, MD;  Location: [place];  Service: Vascular;  Laterality: Right;   RETINAL DETACHMENT SURGERY     RIGID ESOPHAGOSCOPY  04/18/2022   Procedure: RIGID ESOPHAGOSCOPY;  Surgeon: Melida Quitter, MD;  Location: [place];  Service: ENT;;   TONSILLECTOMY  04/18/2022   Procedure: LEFT TONSILLECTOMY;  Surgeon: Melida Quitter, MD;  Location: [place];  Service: ENT;;   ULTRASOUND GUIDANCE FOR VASCULAR ACCESS Bilateral 08/29/2020   Procedure: ULTRASOUND GUIDANCE FOR VASCULAR ACCESS;  Surgeon: Elam Dutch, MD;  Location: [facility] OR;  Service: Vascular;  Laterality: Bilateral;    Allergies  Allergen Reactions   Zithromax [Azithromycin] Diarrhea    Current Outpatient Medications  Medication Sig Dispense Refill   acetaminophen (TYLENOL) 500 MG tablet Take 500 mg by mouth  every 6 (six) hours as needed (for pain.).     cetirizine (ZYRTEC) 10 MG tablet Take 1 tablet (10 mg total) by mouth daily. (Patient taking differently: Take 10 mg by mouth daily as needed for allergies.) 30 tablet 11   cholecalciferol (VITAMIN D) 25 MCG (1000 UNIT) tablet Take 1,000 Units by mouth 2 (two) times daily.     dexamethasone (DECADRON) 4 MG tablet Take 2 tablets daily x 3 days starting the day after cisplatin chemotherapy. Take with food. (Patient not taking: Reported on 08/02/2022) 30 tablet 1   diltiazem (TIADYLT ER) 120 MG 24 hr capsule TAKE 1 CAPSULE EVERY DAY (SCHEDULE OFFICE VISIT FOR FUTURE REFILLS) 90 capsule 1   lidocaine (XYLOCAINE) 2 % solution Patient: Mix 1part 2% viscous lidocaine, 1part H20. Swish & swallow 7m of diluted mixture, 367m before meals  and at bedtime, up to QID prn sore throat. 200 mL 3   lidocaine (XYLOCAINE) 2 % solution Mix 1  part lidocaine with 1 part water, then swish and swallow 10 ml of mixture 30 minutes before meals and at bedtime up to 4 times daily as needed for sore throat 200 mL 0   lidocaine-prilocaine (EMLA) cream Apply to affected area once 30 g 3   metoprolol tartrate (LOPRESSOR) 25 MG tablet Take 1 tablet (25 mg total) by mouth 2 (two) times daily as needed. 30 tablet 1   nicotine (NICODERM CQ - DOSED IN MG/24 HOURS) 21 mg/24hr patch Place 1 patch (21 mg total) onto the skin daily. Apply 21 mg patch daily x 6 wk, then '14mg'$  patch daily x 2 wk, then 7 mg patch daily x 2 wk 14 patch 2   omeprazole (PRILOSEC) 40 MG capsule Take 1 capsule (40 mg total) by mouth daily. 30 capsule 6   ondansetron (ZOFRAN) 8 MG tablet Take 1 tablet (8 mg total) by mouth every 8 (eight) hours as needed for nausea or vomiting. Start on the third day after cisplatin. (Patient not taking: Reported on 08/02/2022) 30 tablet 1   prochlorperazine (COMPAZINE) 10 MG tablet Take 1 tablet (10 mg total) by mouth every 6 (six) hours as needed (Nausea or vomiting). (Patient not taking: Reported on 08/02/2022) 30 tablet 1   rivaroxaban (XARELTO) 20 MG TABS tablet Take 1 tablet (20 mg total) by mouth daily with supper. 90 tablet 1   rosuvastatin (CRESTOR) 10 MG tablet Take 1 tablet (10 mg total) by mouth daily. 90 tablet 1   sodium chloride (OCEAN) 0.65 % SOLN nasal spray Place 1 spray into both nostrils as needed for congestion.     No current facility-administered medications for this visit.    Family History  Problem Relation Age of Onset   Ovarian cancer Mother    Alcoholism Father    Breast cancer Sister    Ovarian cancer Sister     Social History   Socioeconomic History   Marital status: Married    Spouse name: Not on file   Number of children: 5   Years of education: Not on file   Highest education level: Master's degree (e.g., MA, MS, MEng, MEd, MSW, MBA)  Occupational History   Not on file  Tobacco Use   Smoking status:  Every Day    Packs/day: 0.50    Years: 50.00    Total pack years: 25.00    Types: Cigarettes    Passive exposure: Never   Smokeless tobacco: Never  Vaping Use   Vaping Use: Never used  Substance and  Sexual Activity   Alcohol use: Yes    Alcohol/week: 0.0 standard drinks of alcohol    Comment: 4 to 5 times a week; beer, wine and liquor    Drug use: No   Sexual activity: Not Currently  Other Topics Concern   Not on file  Social History Narrative   Scores reading tests.  Lives with wife.     Social Determinants of Health   Financial Resource Strain: Low Risk  (05/24/2022)   Overall Financial Resource Strain (CARDIA)    Difficulty of Paying Living Expenses: Not hard at all  Food Insecurity: No Food Insecurity (08/01/2022)   Hunger Vital Sign    Worried About Running Out of Food in the Last Year: Never true    Ran Out of Food in the Last Year: Never true  Transportation Needs: No Transportation Needs (08/01/2022)   PRAPARE - Hydrologist (Medical): No    Lack of Transportation (Non-Medical): No  Physical Activity: Insufficiently Active (01/08/2022)   Exercise Vital Sign    Days of Exercise per Week: 3 days    Minutes of Exercise per Session: 30 min  Stress: No Stress Concern Present (01/08/2022)   [place]    Feeling of Stress : Not at all  Social Connections: Moderately Isolated (01/08/2022)   Social Connection and Isolation Panel [NHANES]    Frequency of Communication with Friends and Family: More than three times a week    Frequency of Social Gatherings with Friends and Family: Three times a week    Attends Religious Services: Never    Active Member of Clubs or Organizations: No    Attends Archivist Meetings: Not on file    Marital Status: Married  Human resources officer Violence: Not At Risk (06/07/2021)   Humiliation, Afraid, Rape, and Kick questionnaire    Fear of Current  or Ex-Partner: No    Emotionally Abused: No    Physically Abused: No    Sexually Abused: No     REVIEW OF SYSTEMS:   '[X]'$  denotes positive finding, '[ ]'$  denotes negative finding Cardiac  Comments:  Chest pain or chest pressure:    Shortness of breath upon exertion:    Short of breath when lying flat:    Irregular heart rhythm:        Vascular    Pain in calf, thigh, or hip brought on by ambulation:    Pain in feet at night that wakes you up from your sleep:     Blood clot in your veins:    Leg swelling:         Pulmonary    Oxygen at home:    Productive cough:     Wheezing:         Neurologic    Sudden weakness in arms or legs:     Sudden numbness in arms or legs:     Sudden onset of difficulty speaking or slurred speech:    Temporary loss of vision in one eye:     Problems with dizziness:         Gastrointestinal    Blood in stool:     Vomited blood:         Genitourinary    Burning when urinating:     Blood in urine:        Psychiatric    Major depression:         Hematologic  Bleeding problems:    Problems with blood clotting too easily:        Skin    Rashes or ulcers:        Constitutional    Fever or chills:      PHYSICAL EXAMINATION:  Today's Vitals   08/27/22 0850  BP: 124/75  Pulse: (!) 50  Temp: 98 F (36.7 C)  TempSrc: Temporal  SpO2: 96%  Weight: 213 lb (96.6 kg)  Height: '6\' 3"'$  (1.905 m)   Body mass index is 26.62 kg/m.   General:  WDWN in NAD; vital signs documented above Gait: Not observed HENT: WNL, normocephalic Pulmonary: normal non-labored breathing , without wheezing Cardiac: regular HR, without carotid bruits Abdomen: soft, NT; aortic pulse is not palpable; feeding tube in place.  Skin: without rashes Vascular Exam/Pulses:  Right Left  Radial 2+ (normal) 2+ (normal)  Femoral 2+ (normal) 2+ (normal)  Popliteal Unable to palpate Unable to palpate  DP Brisk doppler flow Brisk doppler flow  PT 2+ (normal) 2+  (normal)   Extremities: without ischemic changes, without Gangrene , without cellulitis; without open wounds Musculoskeletal: no muscle wasting or atrophy, mild fullness distal to lower leg incision that is soft and non tender to palpation. Neurologic: A&O X 3 Psychiatric:  The pt has Normal affect.   Non-Invasive Vascular Imaging:   EVAR duplex on 08/29/2022: Abdominal Aorta Findings:  +-------------+-------+----------+----------+----------+--------+--------+  Location    AP (cm)Trans (cm)PSV (cm/s)Waveform  ThrombusComments  +-------------+-------+----------+----------+----------+--------+--------+  RT EIA Mid                    157       triphasic                   +-------------+-------+----------+----------+----------+--------+--------+  LT CIA Distal2.0    2.0       81        monophasic                  +-------------+-------+----------+----------+----------+--------+--------+  LT EIA Prox                   114       biphasic                    +-------------+-------+----------+----------+----------+--------+--------+  LT EIA Mid                    153       triphasic                   +-------------+-------+----------+----------+----------+--------+--------+   Endovascular Aortic Repair (EVAR):  +----------+----------------+-------------------+-------------------+           Diameter AP (cm)Diameter Trans (cm)Velocities (cm/sec)  +----------+----------------+-------------------+-------------------+  Aorta    2.52            2.61               28                   +----------+----------------+-------------------+-------------------+  Right Limb2.83            2.69               59                   +----------+----------------+-------------------+-------------------+  Left Limb 1.91            1.90  75                   +----------+----------------+-------------------+-------------------+   Summary:   Abdominal Aorta: Patent endovascular aneurysm repair with no evidence of endoleak. No significant change in the aortic measurement. Right CIA measurement has decreased.   ABI's/TBI's on 08/29/2022: Right: 1.10/0.63 - Great toe pressure: 92 Left:   1.21/0.60 - Great toe pressure: 87  Arterial duplex on 08/29/2022: +-----------+--------+-----+--------+----------+------------+  LEFT      PSV cm/sRatioStenosisWaveform  Comments      +-----------+--------+-----+--------+----------+------------+  CFA Distal 11                   biphasic                +-----------+--------+-----+--------+----------+------------+  DFA       104                  biphasic                +-----------+--------+-----+--------+----------+------------+  SFA Prox   83                   triphasic               +-----------+--------+-----+--------+----------+------------+  SFA Mid    67                   triphasic               +-----------+--------+-----+--------+----------+------------+  SFA Distal 152                  triphasic graft inflow  +-----------+--------+-----+--------+----------+------------+  POP Prox                                  graft         +-----------+--------+-----+--------+----------+------------+  POP Distal                                graft         +-----------+--------+-----+--------+----------+------------+  ATA Distal 43                   monophasicretrograde    +-----------+--------+-----+--------+----------+------------+  PTA Distal 104                  triphasic               +-----------+--------+-----+--------+----------+------------+  PERO Distal49                   biphasic                +-----------+--------+-----+--------+----------+------------+     Left Graft #1: AK pop to TPT  +--------------------+--------+--------+----------+--------+                     PSV cm/sStenosisWaveform   Comments  +--------------------+--------+--------+----------+--------+  Inflow             152             triphasic           +--------------------+--------+--------+----------+--------+  Proximal Anastomosis119             biphasic            +--------------------+--------+--------+----------+--------+  Proximal Graft      85  triphasic           +--------------------+--------+--------+----------+--------+  Mid Graft           100             triphasic           +--------------------+--------+--------+----------+--------+  Distal Graft        101             triphasic           +--------------------+--------+--------+----------+--------+  Distal Anastomosis  116             monophasic          +--------------------+--------+--------+----------+--------+  Outflow            100             triphasic           +--------------------+--------+--------+----------+--------+   Summary:  Left: Patent graft with no stenosis seen.  The distal ATA appears retrograde.   Previous EVAR duplex 11/09/2021: Endovascular Aortic Repair (EVAR):  +----------+----------------+-------------------+-------------------+           Diameter AP (cm)Diameter Trans (cm)Velocities (cm/sec)  +----------+----------------+-------------------+-------------------+  Aorta    2.67            2.59               96                   +----------+----------------+-------------------+-------------------+  Right Limb3.60            3.48               102                  +----------+----------------+-------------------+-------------------+  Left Limb 1.90            1.80               195                  +----------+----------------+-------------------+-------------------+   Summary:  Abdominal Aorta: Patent endovascular aneurysm repair with no evidence of  endoleak. The largest CIA diameter has decreased compared to prior exam.  Previous diameter  measurement was 4.6 cm obtained on 09/15/2020 by CT.   Previous ABI's/TBI's on 11/09/2021: Right:  1.18/0.60 - Great toe pressure: 96 Left:  1.18/0.68 - Great toe pressure:  108  Previous arterial duplex on 11/09/2021: Left Graft #1: above knee to TPT bypass  +--------------------+--------+--------+----------+--------+                     PSV cm/sStenosisWaveform  Comments  +--------------------+--------+--------+----------+--------+  Inflow             154             biphasic            +--------------------+--------+--------+----------+--------+  Proximal Anastomosis70              monophasic          +--------------------+--------+--------+----------+--------+  Proximal Graft      75              monophasic          +--------------------+--------+--------+----------+--------+  Mid Graft           94              biphasic            +--------------------+--------+--------+----------+--------+  Distal Graft  93              biphasic            +--------------------+--------+--------+----------+--------+  Distal Anastomosis  125             biphasic            +--------------------+--------+--------+----------+--------+  Outflow            95              biphasic            +--------------------+--------+--------+----------+--------+   Summary:  Left: Patent bypass without evidence of stenosis     ASSESSMENT/PLAN:: 77 y.o. male here for follow up for PAD with hx of  left above-knee to below-knee popliteal artery bypass with GSV due to an occluded popliteal artery aneurysm by Dr. Oneida Alar on 11/22/2020.  Prior to that, he underwent right CFA endarterectomy with bovine patch angioplasty, right popliteal and tibial embolectomy with patch angioplasty and EVAR for repair of CIA aneurysm on 08/29/2020 by Dr. Oneida Alar.   EVAR/PAD -pt with palpable PT pulses bilaterally.  He denies any claudication, rest pain or non healing wounds.  Duplex today  reveals right CIA size continues to decrease. His LLE bypass is patent without stenosis -continue statin.  Pt is on Xarelto for afib.  Current smoker -continues to smoke but has cut back with help of patches.  Encouraged him to continue working on this.   -pt will f/u in one year with ABI, EVAR duplex and LLE arterial duplex.  He knows to call sooner if he has any issues before then.     Leontine Locket, [facility] [phone]  Clinic MD:   Trula Slade

## 2022-08-27 ENCOUNTER — Ambulatory Visit (HOSPITAL_COMMUNITY)
Admission: RE | Admit: 2022-08-27 | Discharge: 2022-08-27 | Disposition: A | Discharge status: Home / Self Care | Payer: Medicare HMO | Source: Ambulatory Visit | Attending: Surgery | Admitting: Surgery

## 2022-08-27 ENCOUNTER — Ambulatory Visit (INDEPENDENT_AMBULATORY_CARE_PROVIDER_SITE_OTHER): Payer: Medicare HMO | Admitting: Physician Assistant

## 2022-08-27 ENCOUNTER — Ambulatory Visit (INDEPENDENT_AMBULATORY_CARE_PROVIDER_SITE_OTHER)
Admission: RE | Admit: 2022-08-27 | Discharge: 2022-08-27 | Disposition: A | Discharge status: Home / Self Care | Payer: Medicare HMO | Source: Ambulatory Visit | Attending: Surgery | Admitting: Surgery

## 2022-08-27 VITALS — BP 124/75 | HR 50 | Temp 98.0°F | Ht 75.0 in | Wt 213.0 lb

## 2022-08-27 DIAGNOSIS — I714 Abdominal aortic aneurysm, without rupture, unspecified: Secondary | ICD-10-CM | POA: Diagnosis not present

## 2022-08-27 DIAGNOSIS — Z9889 Other specified postprocedural states: Secondary | ICD-10-CM | POA: Diagnosis not present

## 2022-08-27 DIAGNOSIS — I739 Peripheral vascular disease, unspecified: Secondary | ICD-10-CM | POA: Insufficient documentation

## 2022-08-27 DIAGNOSIS — I724 Aneurysm of artery of lower extremity: Secondary | ICD-10-CM | POA: Diagnosis not present

## 2022-08-27 DIAGNOSIS — Z8679 Personal history of other diseases of the circulatory system: Secondary | ICD-10-CM

## 2022-08-27 DIAGNOSIS — F17200 Nicotine dependence, unspecified, uncomplicated: Secondary | ICD-10-CM | POA: Diagnosis not present

## 2022-08-27 LAB — VAS US ABI WITH/WO TBI
Left ABI: 1.28
Right ABI: 1.1

## 2022-08-29 DIAGNOSIS — H33301 Unspecified retinal break, right eye: Secondary | ICD-10-CM | POA: Diagnosis not present

## 2022-08-29 DIAGNOSIS — H524 Presbyopia: Secondary | ICD-10-CM | POA: Diagnosis not present

## 2022-08-29 DIAGNOSIS — Z961 Presence of intraocular lens: Secondary | ICD-10-CM | POA: Diagnosis not present

## 2022-08-29 DIAGNOSIS — H10413 Chronic giant papillary conjunctivitis, bilateral: Secondary | ICD-10-CM | POA: Diagnosis not present

## 2022-08-30 ENCOUNTER — Other Ambulatory Visit: Payer: Self-pay

## 2022-08-30 ENCOUNTER — Inpatient Hospital Stay: Payer: Medicare HMO | Attending: Radiation Oncology | Admitting: Dietician

## 2022-08-30 ENCOUNTER — Encounter: Payer: Self-pay | Admitting: Gastroenterology

## 2022-08-30 DIAGNOSIS — C321 Malignant neoplasm of supraglottis: Secondary | ICD-10-CM

## 2022-08-30 NOTE — Progress Notes (Signed)
Nutrition Follow-up:  Patient has completed concurrent chemoradiation therapy for SCC of supraglottis. Final radiation 07/18/22.  Met with patient in office. He reports good appetite. Patient is tolerating regular diet without difficulty. He recalls 3-4 meals daily plus snacks. Yesterday patient had sausage/egg muffin for breakfast, bacon cheeseburger for lunch, 4 slices of pizza for dinner. Patient reports thick saliva has resolved. He continues to have dry mouth. Says he is constantly drinking water. Patient has not used feeding tube other than daily water flush in the last 4 weeks. Patient reports improved energy. He has enjoyed going to his grandchildren's basketball games. Patient has a trip planned to [place] [facility] in April.    Medications: reviewed   Labs: no new labs   Anthropometrics: Wt 217.8 lb today in office increased   12/22 - 212 lb 12.8 oz  11/28 - 212 lb 11.2 oz  10/26 - 220 lb 14.4 oz   NUTRITION DIAGNOSIS: Inadequate oral intake resolved    INTERVENTION:  Patient is maintaining adequate oral intake to promote weight maintenance/gain x 4 weeks  Recommend patient have feeding tube removed at this time    MONITORING, EVALUATION, GOAL: weight trends, intake    NEXT VISIT: No follow-up scheduled. Patient encouraged to contact with questions or concerns

## 2022-09-04 ENCOUNTER — Ambulatory Visit: Payer: Medicare HMO | Attending: Neurology

## 2022-09-04 ENCOUNTER — Ambulatory Visit (HOSPITAL_COMMUNITY)
Admission: RE | Admit: 2022-09-04 | Discharge: 2022-09-04 | Disposition: A | Discharge status: Home / Self Care | Payer: Medicare HMO | Source: Ambulatory Visit | Attending: Hematology and Oncology | Admitting: Hematology and Oncology

## 2022-09-04 DIAGNOSIS — C760 Malignant neoplasm of head, face and neck: Secondary | ICD-10-CM | POA: Diagnosis not present

## 2022-09-04 DIAGNOSIS — R1310 Dysphagia, unspecified: Secondary | ICD-10-CM | POA: Diagnosis not present

## 2022-09-04 DIAGNOSIS — Z431 Encounter for attention to gastrostomy: Secondary | ICD-10-CM | POA: Diagnosis not present

## 2022-09-04 DIAGNOSIS — C321 Malignant neoplasm of supraglottis: Secondary | ICD-10-CM

## 2022-09-04 DIAGNOSIS — Z4803 Encounter for change or removal of drains: Secondary | ICD-10-CM | POA: Diagnosis not present

## 2022-09-04 HISTORY — PX: IR GASTROSTOMY TUBE REMOVAL: IMG5492

## 2022-09-04 MED ORDER — LIDOCAINE VISCOUS HCL 2 % MT SOLN: 15.0000 mL | Freq: Once | OROMUCOSAL | Status: AC

## 2022-09-04 MED ORDER — LIDOCAINE VISCOUS HCL 2 % MT SOLN
OROMUCOSAL | Status: AC
  Administered 2022-09-04: 15 mL via ORAL
  Filled 2022-09-04: qty 15

## 2022-09-04 NOTE — Therapy (Signed)
OUTPATIENT SPEECH LANGUAGE PATHOLOGY TREATMENT/RECERTIFICATION   Patient Name: Cory [name] MRN: [id number] DOB:1946-04-14, 78 y.o., male Today's Date: 09/04/2022  PCP: Carolann Littler, MD REFERRING PROVIDER: [name], MD   End of Session - 09/04/22 1142     Visit Number 4    Number of Visits 7    Date for SLP Re-Evaluation 10/04/22    SLP Start Time 1104    SLP Stop Time  1130    SLP Time Calculation (min) 26 min    Activity Tolerance Patient tolerated treatment well                Past Medical History:  Diagnosis Date   Aortic stenosis    a. Moderate by echo 05/2020.   Arthritis    right hip   Cancer ([place])    skin cancer nose, right left, upper left basel cell   Dysrhythmia    a-fib   PAF (paroxysmal atrial fibrillation) (HCC)    Peripheral arterial disease (HCC)    Tobacco abuse    Past Surgical History:  Procedure Laterality Date   ABDOMINAL AORTIC ENDOVASCULAR STENT GRAFT N/A 08/29/2020   Procedure: ABDOMINAL AORTIC ENDOVASCULAR STENT GRAFT;  Surgeon: Elam Dutch, MD;  Location: [place];  Service: Vascular;  Laterality: N/A;   ABDOMINAL AORTOGRAM W/LOWER EXTREMITY Bilateral 06/24/2020   Procedure: ABDOMINAL AORTOGRAM W/LOWER EXTREMITY;  Surgeon: Elam Dutch, MD;  Location: [place] [facility];  Service: Cardiovascular;  Laterality: Bilateral;   BACK SURGERY     2005   BIOPSY  05/21/2022   Procedure: BIOPSY;  Surgeon: Rush Landmark Telford Nab., MD;  Location: [facility];  Service: Gastroenterology;;   CERVICAL SPINE SURGERY     DIRECT LARYNGOSCOPY Bilateral 04/18/2022   Procedure: DIRECT LARYNGOSCOPY WITH BIOPSIES;  Surgeon: Melida Quitter, MD;  Location: [place];  Service: ENT;  Laterality: Bilateral;   EMBOLECTOMY Right 08/29/2020   Procedure: POPLITEAL AND TIBIAL EMBOLECTOMY;  Surgeon: Elam Dutch, MD;  Location: [place];  Service: Vascular;  Laterality: Right;   EMBOLIZATION Right 06/24/2020   Procedure: EMBOLIZATION;  Surgeon: Elam Dutch, MD;  Location: [place] [facility];  Service: Cardiovascular;  Laterality: Right;   ENDARTERECTOMY FEMORAL Right 08/29/2020   Procedure: ENDARTERECTOMY FEMORAL;  Surgeon: Elam Dutch, MD;  Location: [facility] OR;  Service: Vascular;  Laterality: Right;   ESOPHAGOGASTRODUODENOSCOPY N/A 06/27/2022   Procedure: ESOPHAGOGASTRODUODENOSCOPY (EGD);  Surgeon: Irving Copas., MD;  Location: [facility];  Service: Gastroenterology;  Laterality: N/A;   ESOPHAGOGASTRODUODENOSCOPY (EGD) WITH PROPOFOL N/A 05/21/2022   Procedure: ESOPHAGOGASTRODUODENOSCOPY (EGD) WITH PROPOFOL;  Surgeon: Rush Landmark Telford Nab., MD;  [facility];  Service: Gastroenterology;  Laterality: N/A;   EUS N/A 05/21/2022   Procedure: UPPER ENDOSCOPIC ULTRASOUND (EUS) RADIAL;  Surgeon: Irving Copas., MD;  [facility];  Service: Gastroenterology;  Laterality: N/A;   EUS N/A 06/27/2022   Procedure: UPPER ENDOSCOPIC ULTRASOUND (EUS) RADIAL;  Surgeon: Irving Copas., MD;  [facility];  Service: Gastroenterology;  Laterality: N/A;   EYE SURGERY     FEMORAL ARTERY EXPLORATION Right 06/25/2020   Procedure: RIGHT GROIN EXPLORATION Evacuation of Hematoma  WITH REPAIR OF RIGHT Common FEMORAL ARTERY.;  Surgeon: Angelia Mould, MD;  Location: [facility] OR;  Service: Vascular;  Laterality: Right;   FEMORAL-POPLITEAL BYPASS GRAFT Left 11/22/2020   Procedure: LEFT ABOVE KNEE-BELOW KNEE BYPASS TIBIAL PERITONEAL TRUNK REVERSE IPSILATERAL GREATER SAPHENOUS VEIN;  Surgeon: Elam Dutch, MD;  Location: [place];  Service: Vascular;  Laterality: Left;   FINE NEEDLE ASPIRATION N/A 05/21/2022   Procedure: FINE NEEDLE ASPIRATION (FNA) LINEAR;  Surgeon: Irving Copas., MD;  [facility];  Service: Gastroenterology;  Laterality: N/A;   FINE NEEDLE ASPIRATION  06/27/2022   Procedure: FINE NEEDLE ASPIRATION;  Surgeon: Rush Landmark Telford Nab., MD;  [facility];   Service: Gastroenterology;;   HOT HEMOSTASIS N/A 06/27/2022   Procedure: HOT HEMOSTASIS (ARGON PLASMA COAGULATION/BICAP);  Surgeon: Irving Copas., MD;  Location: [facility];  Service: Gastroenterology;  Laterality: N/A;   IR GASTROSTOMY TUBE MOD SED  06/12/2022   IR IMAGING GUIDED PORT INSERTION  06/12/2022   LEG SURGERY     Trauma   LOWER EXTREMITY ANGIOGRAPHY Left 11/17/2020   Procedure: Lower Extremity Angiography;  Surgeon: Marty Heck, MD;  Location: [place] [facility];  Service: Cardiovascular;  Laterality: Left;   PATCH ANGIOPLASTY Right 08/29/2020   Procedure: PATCH ANGIOPLASTY RIGHT BELOW KNEE POPLITEAL ARTERY AND RIGHT COMMON FEMORAL ARTERY;  Surgeon: Elam Dutch, MD;  Location: [facility] OR;  Service: Vascular;  Laterality: Right;   RETINAL DETACHMENT SURGERY     RIGID ESOPHAGOSCOPY  04/18/2022   Procedure: RIGID ESOPHAGOSCOPY;  Surgeon: Melida Quitter, MD;  Location: [place];  Service: ENT;;   TONSILLECTOMY  04/18/2022   Procedure: LEFT TONSILLECTOMY;  Surgeon: Melida Quitter, MD;  Location: [place];  Service: ENT;;   ULTRASOUND GUIDANCE FOR VASCULAR ACCESS Bilateral 08/29/2020   Procedure: ULTRASOUND GUIDANCE FOR VASCULAR ACCESS;  Surgeon: Elam Dutch, MD;  Location: [place];  Service: Vascular;  Laterality: Bilateral;   Patient Active Problem List   Diagnosis Date Noted   Mucositis due to chemotherapy 07/17/2022   Port-A-Cath in place 06/21/2022   Malignant neoplasm of supraglottis ([place]) 05/08/2022   Cancer of supraglottis ([place]) 05/04/2022   Rib pain on left side 12/01/2021   Chronic sore throat 08/15/2021   Dyslipidemia 07/17/2021   AAA (abdominal aortic aneurysm) ([place]) 08/29/2020   PAD (peripheral artery disease) ([place]) 06/25/2020   Educated about COVID-19 virus infection 05/12/2020   Tobacco abuse 05/12/2020   Peripheral vascular disease ([place]) 04/25/2020   Abdominal aortic aneurysm (AAA) ([place]) 04/25/2020   Essential hypertension 05/16/2018   Medication  management 05/16/2018   Piriformis syndrome of right side 05/06/2018   Allergy 10/29/2017   Recurrent sinusitis 10/29/2017   Atrial fibrillation, transient ([place]) 06/07/2015   Aortic stenosis    ABNORMAL ELECTROCARDIOGRAM 07/14/2009    ONSET DATE: See "pertinent history"   REFERRING DIAG: Malignant neoplasm of supraglottis, Squamous cell, Stage IVA   THERAPY DIAG:  Dysphagia, unspecified type  Rationale for Evaluation and Treatment Rehabilitation  SUBJECTIVE:   SUBJECTIVE STATEMENT: "I eat anything."   Pt accompanied by: self  PERTINENT HISTORY:  He presented to Dr. Redmond Baseman on 08/15/21 with c/o persistent left sided sore throat since August 2022. Laryngoscopy completed at this time was unremarkable. 11/08/21 CT neck revealed a sub-centimeter focus of mucosal hyperenhancement along the posterior aspect of the uvula, and a few tiny cystic appearing foci along the left glosso-tonsillar sulcus. CT also showed a nonspecific mildly enlarged right level 2 lymph node measuring 14 mm in the short axis, and multiple small calcific foci within the left palatine tonsil possibly reflective of post-inflammatory calcifications and/or tonsilloliths. 11/21/21 repeat Laryngoscopy by Dr. Redmond Baseman again was unremarkable. Due to persistent throat pain Dr. Redmond Baseman advised proceeding with a direct laryngoscopy and rigid esophagosopy with biopsies. 04/18/22 Left laryngeal biopsies showed invasive moderately differentiated SCC.  05/07/22 PET completed showing  Asymmetric Hypermetabolism in the left oropharynx and involving the left epiglottis. Imaging features compatible with primary neoplasm. (I believe some of the oropharynx SUV uptake could be r/t abscess, recent biopsy), Contralateral (right-sided) hypermetabolic level II cervical lymph nodes compatible with metastatic disease. No hypermetabolic lymphadenopathy in the left neck, and 1.2 cm short axis aortocaval lymph node in the upper abdomen is hypermetabolic. Metastatic  disease is a concern. This lymph node has increased from 0.7 cm on a study from 2022. (I've messaged GI providers to see if this could be bx'd by endoscopy).  05/21/22 Biopsy of the lymph node in his abdomen  with CT simulation planned for 05/23/22. Treatment plan will depend on results of the biopsy on 10/16 but will most likely be weekly chemo and 35 fractions of radiation. Treatment plan:  He will receive 35 fractions of radiation and weekly chemotherapy to his larynx and bilateral neck.  He is scheduled to start on 05/30/22 and complete 07/19/22. PEG/PAC pending.  PAIN:  Are you having pain? No   PATIENT GOALS: Maintain WNL swallowing  OBJECTIVE:   TODAY'S TREATMENT: 09/04/22: "I'm getting my feeding tube out today." Pt indicates his frequency of HEP has improved dramatically since last session. He told SLP date he could reduce frequency to x2-3/week. HEP procedure was WNL today. Pt ate peanut butter crackers with liquid wash, no indication of any deficits in oral stage, and no overt s/sx pharyngeal difficulty. SLP to cont with pt every other month. Pt agrees. Possible d/c next session if progress continues.  08/02/22: "I really don't have any problems when I'm eating." Pt complains of xerostomia and of ageusia except for onion and garlic. Today pt ate cereal bar and drank water without overt s/sx of oral or pharyngeal deficts. Pt told SLP he has not completed HEP at prescribed frequency for the last 2-3 weeks. SLP reiterated rationale for frequency and for HEP in general and reminded pt about completing in this manner until 01/18/23, and then twice/three times a week after that. Pt completed today with SBA for tongue protrusion with [place]. If pt maintains progress next visit, consider every other month  06/21/22: Pt not performing HEP at prescribed frequency. SLP reminded pt at least x10 reps twice a day was target completion. Pt told SLP rationale for HEP without cues. Procedure with HEP today  was WNL - pt was independent. SLP explained/reminded pt to cycle through HEP if he needs to do so before next ST appointment. SLP educated pt on overt s/sx aspiration PNA.  Pt ate ham sandwich and drank water without any overt s/sx oral or pharyngeal deficits; he reports no overt s/sx dysphagia with POs to date.    05/24/22: Research states the risk for dysphagia increases due to radiation and/or chemotherapy treatment due to a variety of factors, so SLP educated the pt about the possibility of reduced/limited ability for PO intake during rad tx. SLP also educated pt regarding possible changes to swallowing musculature after rad tx, and why adherence to dysphagia HEP provided today and PO consumption was necessary to inhibit muscle fibrosis following rad tx and to mitigate muscle disuse atrophy. SLP informed pt why this would be detrimental to their swallowing status and to their pulmonary health. Pt demonstrated understanding of these things to SLP. SLP encouraged pt to safely eat and drink as deep into their radiation/chemotherapy as possible to provide the best possible long-term swallowing outcome for pt.    SLP then developed an individualized HEP for pt involving oral  and pharyngeal and vocal  strengthening and ROM and pt was instructed how to perform these exercises, including SLP demonstration. After SLP demonstration, pt return demonstrated each exercise. SLP ensured pt performance was correct prior to educating pt on next exercise. Pt required min cues faded to modified independent to perform HEP. Pt was instructed to complete this program 6-7 days/week, at least 2 times a day until 6 months after his or her last day of rad tx, and then x2 a week after that, indefinitely. Modifications for days when pt cannot perform HEP as prescribed include cycle through the full program of exercises instead of fatiguing on one of the swallowing exercises and being unable to perform the others. SLP instructed that  swallowing exercises should then be increased back as pt is able to do so. Secondly, pt was told that former patients have told SLP that during their course of radiation therapy, taking prescribed pain medication just prior to performing HEP (and eating/drinking) has proven helpful in completing HEP (and eating and drinking) more regularly when going through their course of radiation treatment.   PATIENT EDUCATION: Education details: late effects head/neck radiation on swallow function Person educated: Patient Education method: Explanation Education comprehension: verbalized understanding   ASSESSMENT:  CLINICAL IMPRESSION: RECERT TODAY. Patient is a 77 y.o. male who was seen today for treatment of swallowing after undergoing radiation/chemoradiation therapy. Today pt ate items from regular diet and drank thin liquids without overt s/s oral or pharyngeal difficulty. At this time pt swallowing is deemed WNL/WFL with these POs. No oral or overt s/sx pharyngeal deficits, including aspiration were observed. Pt's completion of HEP has improved since last session. There are no overt s/s aspiration PNA observed by SLP nor any reported by pt at this time. Data indicate that pt's swallow ability could very well decline over time following the conclusion of t CRT due to muscle disuse atrophy and/or muscle fibrosis. Pt will cont to need to be seen by SLP in order to assess safety of PO intake, assess the need for recommending any objective swallow assessment, and ensuring pt is correctly completing the individualized HEP. Possible d/c next session.  OBJECTIVE IMPAIRMENTS: include dysphagia. These impairments are limiting patient from safety when swallowing. Factors affecting potential to achieve goals and functional outcome are  none . Patient will benefit from skilled SLP services to address above impairments and improve overall function.  REHAB POTENTIAL: Good    GOALS: Goals reviewed with patient? No    SHORT TERM GOALS: Target date:  2 therapy visits (visit #3)       pt will complete HEP with rare min A  Baseline: Goal status: Met   2.  pt will tell SLP why pt is completing HEP with modified independence Baseline:  Goal status: Met   3.  pt will describe 3 overt s/s aspiration PNA with modified independence Baseline:  Goal status: Met     LONG TERM GOALS: Target date:  6 therapy visits (visit #7)      pt will complete HEP with modified independnence in 2 sessions Baseline: 08/02/22 Goal status: Met   2.  pt will describe how to modify HEP over time, and the timeline associated with reduction in HEP frequency with modified independence over two sessions Baseline: 09/04/22 Goal status: Ongoing     PLAN: SLP FREQUENCY:  once every approx 8 weeks   SLP DURATION:  7 total sessions   PLANNED INTERVENTIONS: Aspiration precaution training, Pharyngeal strengthening exercises, Diet toleration management ,  Trials of upgraded texture/liquids, Internal/external aids, SLP instruction and feedback, Compensatory strategies, and Patient/family education     [facility] [place], [place] 09/04/2022, 11:43 AM

## 2022-09-04 NOTE — Procedures (Signed)
Per order of Dr. Chryl Heck, pt's 20 fr pull thru gastrostomy tube was removed in its entirety without immediate complications. Gauze dressing applied to site. EBL< 2 cc. Medication used- viscous lidocaine into gastrostomy tube insertion site tract.

## 2022-09-05 ENCOUNTER — Other Ambulatory Visit: Payer: Self-pay

## 2022-09-10 ENCOUNTER — Other Ambulatory Visit: Payer: Self-pay

## 2022-09-10 DIAGNOSIS — C321 Malignant neoplasm of supraglottis: Secondary | ICD-10-CM

## 2022-09-11 ENCOUNTER — Other Ambulatory Visit: Payer: Self-pay | Admitting: Cardiology

## 2022-09-12 ENCOUNTER — Encounter: Payer: Self-pay | Admitting: Family Medicine

## 2022-09-12 ENCOUNTER — Ambulatory Visit (INDEPENDENT_AMBULATORY_CARE_PROVIDER_SITE_OTHER): Payer: Medicare HMO | Admitting: Family Medicine

## 2022-09-12 VITALS — BP 126/62 | HR 56 | Temp 97.5°F | Ht 75.0 in | Wt 208.3 lb

## 2022-09-12 DIAGNOSIS — L989 Disorder of the skin and subcutaneous tissue, unspecified: Secondary | ICD-10-CM | POA: Diagnosis not present

## 2022-09-12 NOTE — Patient Instructions (Signed)
Let me know if you have not heard from the skin surgery center in two weeks.

## 2022-09-12 NOTE — Progress Notes (Signed)
Established Patient Office Visit  Subjective   Patient ID: Cory [name], male    DOB: [birth year]  Age: 77 y.o. MRN: [id number]  Chief Complaint  Patient presents with   Boil    Patient complains of boil on right arm, x1 month     HPI   Cory [name] is seen with chief complaint of a "boil "on his right forearm.  He has chronic problems including peripheral vascular disease, abdominal aortic aneurysm, atrial fibrillation, cancer of the supraglottis with the recent chemotherapy and radiation therapy completed in December, dyslipidemia, longstanding history of tobacco use.  He also has history of skin cancers including basal cell and squamous cell.  Right forearm lesion was first noted just a little over a month ago but rapidly growing.  Nonpainful.  No bleeding.  No drainage.  Past Medical History:  Diagnosis Date   Aortic stenosis    a. Moderate by echo 05/2020.   Arthritis    right hip   Cancer ([place])    skin cancer nose, right left, upper left basel cell   Dysrhythmia    a-fib   PAF (paroxysmal atrial fibrillation) (HCC)    Peripheral arterial disease (HCC)    Tobacco abuse    Past Surgical History:  Procedure Laterality Date   ABDOMINAL AORTIC ENDOVASCULAR STENT GRAFT N/A 08/29/2020   Procedure: ABDOMINAL AORTIC ENDOVASCULAR STENT GRAFT;  Surgeon: Elam Dutch, MD;  Location: [place];  Service: Vascular;  Laterality: N/A;   ABDOMINAL AORTOGRAM W/LOWER EXTREMITY Bilateral 06/24/2020   Procedure: ABDOMINAL AORTOGRAM W/LOWER EXTREMITY;  Surgeon: Elam Dutch, MD;  Location: [place] [facility];  Service: Cardiovascular;  Laterality: Bilateral;   BACK SURGERY     2005   BIOPSY  05/21/2022   Procedure: BIOPSY;  Surgeon: Rush Landmark Telford Nab., MD;  Location: [facility];  Service: Gastroenterology;;   CERVICAL SPINE SURGERY     DIRECT LARYNGOSCOPY Bilateral 04/18/2022   Procedure: DIRECT LARYNGOSCOPY WITH BIOPSIES;  Surgeon: Melida Quitter, MD;  Location: [place];  Service:  ENT;  Laterality: Bilateral;   EMBOLECTOMY Right 08/29/2020   Procedure: POPLITEAL AND TIBIAL EMBOLECTOMY;  Surgeon: Elam Dutch, MD;  Location: [place];  Service: Vascular;  Laterality: Right;   EMBOLIZATION Right 06/24/2020   Procedure: EMBOLIZATION;  Surgeon: Elam Dutch, MD;  Location: [place] [facility];  Service: Cardiovascular;  Laterality: Right;   ENDARTERECTOMY FEMORAL Right 08/29/2020   Procedure: ENDARTERECTOMY FEMORAL;  Surgeon: Elam Dutch, MD;  Location: [facility] OR;  Service: Vascular;  Laterality: Right;   ESOPHAGOGASTRODUODENOSCOPY N/A 06/27/2022   Procedure: ESOPHAGOGASTRODUODENOSCOPY (EGD);  Surgeon: Irving Copas., MD;  Location: [facility];  Service: Gastroenterology;  Laterality: N/A;   ESOPHAGOGASTRODUODENOSCOPY (EGD) WITH PROPOFOL N/A 05/21/2022   Procedure: ESOPHAGOGASTRODUODENOSCOPY (EGD) WITH PROPOFOL;  Surgeon: Rush Landmark Telford Nab., MD;  [facility];  Service: Gastroenterology;  Laterality: N/A;   EUS N/A 05/21/2022   Procedure: UPPER ENDOSCOPIC ULTRASOUND (EUS) RADIAL;  Surgeon: Irving Copas., MD;  [facility];  Service: Gastroenterology;  Laterality: N/A;   EUS N/A 06/27/2022   Procedure: UPPER ENDOSCOPIC ULTRASOUND (EUS) RADIAL;  Surgeon: Irving Copas., MD;  [facility];  Service: Gastroenterology;  Laterality: N/A;   EYE SURGERY     FEMORAL ARTERY EXPLORATION Right 06/25/2020   Procedure: RIGHT GROIN EXPLORATION Evacuation of Hematoma  WITH REPAIR OF RIGHT Common FEMORAL ARTERY.;  Surgeon: Angelia Mould, MD;  Location: [place];  Service: Vascular;  Laterality: Right;   FEMORAL-POPLITEAL  BYPASS GRAFT Left 11/22/2020   Procedure: LEFT ABOVE KNEE-BELOW KNEE BYPASS TIBIAL PERITONEAL TRUNK REVERSE IPSILATERAL GREATER SAPHENOUS VEIN;  Surgeon: Elam Dutch, MD;  Location: [facility] OR;  Service: Vascular;  Laterality: Left;   FINE NEEDLE ASPIRATION N/A 05/21/2022   Procedure: FINE NEEDLE  ASPIRATION (FNA) LINEAR;  Surgeon: Irving Copas., MD;  [facility];  Service: Gastroenterology;  Laterality: N/A;   FINE NEEDLE ASPIRATION  06/27/2022   Procedure: FINE NEEDLE ASPIRATION;  Surgeon: Rush Landmark Telford Nab., MD;  [facility];  Service: Gastroenterology;;   HOT HEMOSTASIS N/A 06/27/2022   Procedure: HOT HEMOSTASIS (ARGON PLASMA COAGULATION/BICAP);  Surgeon: Irving Copas., MD;  Location: [facility];  Service: Gastroenterology;  Laterality: N/A;   IR GASTROSTOMY TUBE MOD SED  06/12/2022   IR GASTROSTOMY TUBE REMOVAL  09/04/2022   IR IMAGING GUIDED PORT INSERTION  06/12/2022   LEG SURGERY     Trauma   LOWER EXTREMITY ANGIOGRAPHY Left 11/17/2020   Procedure: Lower Extremity Angiography;  Surgeon: Marty Heck, MD;  Location: [place] [facility];  Service: Cardiovascular;  Laterality: Left;   PATCH ANGIOPLASTY Right 08/29/2020   Procedure: PATCH ANGIOPLASTY RIGHT BELOW KNEE POPLITEAL ARTERY AND RIGHT COMMON FEMORAL ARTERY;  Surgeon: Elam Dutch, MD;  Location: [place];  Service: Vascular;  Laterality: Right;   RETINAL DETACHMENT SURGERY     RIGID ESOPHAGOSCOPY  04/18/2022   Procedure: RIGID ESOPHAGOSCOPY;  Surgeon: Melida Quitter, MD;  Location: [place];  Service: ENT;;   TONSILLECTOMY  04/18/2022   Procedure: LEFT TONSILLECTOMY;  Surgeon: Melida Quitter, MD;  Location: [place];  Service: ENT;;   ULTRASOUND GUIDANCE FOR VASCULAR ACCESS Bilateral 08/29/2020   Procedure: ULTRASOUND GUIDANCE FOR VASCULAR ACCESS;  Surgeon: Elam Dutch, MD;  Location: [place];  Service: Vascular;  Laterality: Bilateral;    reports that he has been smoking cigarettes. He has a 25.00 pack-year smoking history. He has never been exposed to tobacco smoke. He has never used smokeless tobacco. He reports current alcohol use. He reports that he does not use drugs. family history includes Alcoholism in his father; Breast cancer in his sister; Ovarian cancer in his  mother and sister. Allergies  Allergen Reactions   Zithromax [Azithromycin] Diarrhea    Review of Systems  Constitutional:  Negative for chills, fever and weight loss.      Objective:     BP 126/62 (BP Location: Left Arm, Patient Position: Sitting, Cuff Size: Normal)   Pulse (!) 56   Temp (!) 97.5 F (36.4 C) (Oral)   Ht '6\' 3"'$  (1.905 m)   Wt 208 lb 4.8 oz (94.5 kg)   SpO2 98%   BMI 26.04 kg/m  BP Readings from Last 3 Encounters:  09/12/22 126/62  08/27/22 124/75  07/31/22 120/67   Wt Readings from Last 3 Encounters:  09/12/22 208 lb 4.8 oz (94.5 kg)  08/30/22 217 lb 12.8 oz (98.8 kg)  08/27/22 213 lb (96.6 kg)      Physical Exam Vitals reviewed.  Cardiovascular:     Rate and Rhythm: Normal rate and regular rhythm.  Skin:    Comments: Right forearm ulnar side reveals 1.5 by 2 cm nodular firm well-circumscribed lesion with slightly scaly surface  Neurological:     Mental Status: He is alert.      No results found for any visits on 09/12/22.    The ASCVD Risk score (Arnett DK, et al., 2019) failed to calculate for the following reasons:   The  valid total cholesterol range is 130 to 320 mg/dL    Assessment & Plan:   Problem List Items Addressed This Visit   None Visit Diagnoses     Skin lesion    -  Primary   Relevant Orders   Ambulatory referral to Dermatology     Patient has rapidly growing right forearm lesion.  Suspect squamous cell carcinoma.  Given size will go ahead and set up with dermatology.  Will likely need elliptical excision.  We placed this is a urgent referral.  He will let us know if he has not heard from them in 2 weeks.  No follow-ups on file.    Carolann Littler, MD

## 2022-09-17 NOTE — Therapy (Signed)
OUTPATIENT PHYSICAL THERAPY ONCOLOGY EVALUATION  Patient Name: Cory [name] MRN: [id number] DOB:11-Oct-1945, 77 y.o., male Today's Date: 09/19/2022  END OF SESSION:  PT End of Session - 09/19/22 1241     Visit Number 1    Number of Visits 9    Date for PT Re-Evaluation 10/17/22    PT Start Time [id number]    PT Stop Time 1239    PT Time Calculation (min) 45 min    Activity Tolerance Patient tolerated treatment well    Behavior During Therapy [facility] for tasks assessed/performed             Past Medical History:  Diagnosis Date   Aortic stenosis    a. Moderate by echo 05/2020.   Arthritis    right hip   Cancer ([place])    skin cancer nose, right left, upper left basel cell   Dysrhythmia    a-fib   PAF (paroxysmal atrial fibrillation) (HCC)    Peripheral arterial disease (HCC)    Tobacco abuse    Past Surgical History:  Procedure Laterality Date   ABDOMINAL AORTIC ENDOVASCULAR STENT GRAFT N/A 08/29/2020   Procedure: ABDOMINAL AORTIC ENDOVASCULAR STENT GRAFT;  Surgeon: Elam Dutch, MD;  Location: [place];  Service: Vascular;  Laterality: N/A;   ABDOMINAL AORTOGRAM W/LOWER EXTREMITY Bilateral 06/24/2020   Procedure: ABDOMINAL AORTOGRAM W/LOWER EXTREMITY;  Surgeon: Elam Dutch, MD;  Location: [place] [facility];  Service: Cardiovascular;  Laterality: Bilateral;   BACK SURGERY     2005   BIOPSY  05/21/2022   Procedure: BIOPSY;  Surgeon: Rush Landmark Telford Nab., MD;  Location: [facility];  Service: Gastroenterology;;   CERVICAL SPINE SURGERY     DIRECT LARYNGOSCOPY Bilateral 04/18/2022   Procedure: DIRECT LARYNGOSCOPY WITH BIOPSIES;  Surgeon: Melida Quitter, MD;  Location: [place];  Service: ENT;  Laterality: Bilateral;   EMBOLECTOMY Right 08/29/2020   Procedure: POPLITEAL AND TIBIAL EMBOLECTOMY;  Surgeon: Elam Dutch, MD;  Location: [place];  Service: Vascular;  Laterality: Right;   EMBOLIZATION Right 06/24/2020   Procedure: EMBOLIZATION;  Surgeon: Elam Dutch, MD;   Location: [place] [facility];  Service: Cardiovascular;  Laterality: Right;   ENDARTERECTOMY FEMORAL Right 08/29/2020   Procedure: ENDARTERECTOMY FEMORAL;  Surgeon: Elam Dutch, MD;  Location: [facility];  Service: Vascular;  Laterality: Right;   ESOPHAGOGASTRODUODENOSCOPY N/A 06/27/2022   Procedure: ESOPHAGOGASTRODUODENOSCOPY (EGD);  Surgeon: Irving Copas., MD;  [facility];  Service: Gastroenterology;  Laterality: N/A;   ESOPHAGOGASTRODUODENOSCOPY (EGD) WITH PROPOFOL N/A 05/21/2022   Procedure: ESOPHAGOGASTRODUODENOSCOPY (EGD) WITH PROPOFOL;  Surgeon: Rush Landmark Telford Nab., MD;  [facility]: [facility];  Service: Gastroenterology;  Laterality: N/A;   EUS N/A 05/21/2022   Procedure: UPPER ENDOSCOPIC ULTRASOUND (EUS) RADIAL;  Surgeon: Irving Copas., MD;  Location: [facility];  Service: Gastroenterology;  Laterality: N/A;   EUS N/A 06/27/2022   Procedure: UPPER ENDOSCOPIC ULTRASOUND (EUS) RADIAL;  Surgeon: Irving Copas., MD;  Location: [facility];  Service: Gastroenterology;  Laterality: N/A;   EYE SURGERY     FEMORAL ARTERY EXPLORATION Right 06/25/2020   Procedure: RIGHT GROIN EXPLORATION Evacuation of Hematoma  WITH REPAIR OF RIGHT Common FEMORAL ARTERY.;  Surgeon: Angelia Mould, MD;  Location: [place];  Service: Vascular;  Laterality: Right;   FEMORAL-POPLITEAL BYPASS GRAFT Left 11/22/2020   Procedure: LEFT ABOVE KNEE-BELOW KNEE BYPASS TIBIAL PERITONEAL TRUNK REVERSE IPSILATERAL GREATER SAPHENOUS VEIN;  Surgeon: Elam Dutch, MD;  Location: [place];  Service: Vascular;  Laterality:  Left;   FINE NEEDLE ASPIRATION N/A 05/21/2022   Procedure: FINE NEEDLE ASPIRATION (FNA) LINEAR;  Surgeon: Irving Copas., MD;  [facility];  Service: Gastroenterology;  Laterality: N/A;   FINE NEEDLE ASPIRATION  06/27/2022   Procedure: FINE NEEDLE ASPIRATION;  Surgeon: Rush Landmark Telford Nab., MD;  [facility];  Service:  Gastroenterology;;   HOT HEMOSTASIS N/A 06/27/2022   Procedure: HOT HEMOSTASIS (ARGON PLASMA COAGULATION/BICAP);  Surgeon: Irving Copas., MD;  [facility];  Service: Gastroenterology;  Laterality: N/A;   IR GASTROSTOMY TUBE MOD SED  06/12/2022   IR GASTROSTOMY TUBE REMOVAL  09/04/2022   IR IMAGING GUIDED PORT INSERTION  06/12/2022   LEG SURGERY     Trauma   LOWER EXTREMITY ANGIOGRAPHY Left 11/17/2020   Procedure: Lower Extremity Angiography;  Surgeon: Marty Heck, MD;  Location: [place] [facility];  Service: Cardiovascular;  Laterality: Left;   PATCH ANGIOPLASTY Right 08/29/2020   Procedure: PATCH ANGIOPLASTY RIGHT BELOW KNEE POPLITEAL ARTERY AND RIGHT COMMON FEMORAL ARTERY;  Surgeon: Elam Dutch, MD;  Location[facility];  Service: Vascular;  Laterality: Right;   RETINAL DETACHMENT SURGERY     RIGID ESOPHAGOSCOPY  04/18/2022   Procedure: RIGID ESOPHAGOSCOPY;  Surgeon: Melida Quitter, MD;  Location: [place];  Service: ENT;;   TONSILLECTOMY  04/18/2022   Procedure: LEFT TONSILLECTOMY;  Surgeon: Melida Quitter, MD;  Location: [place];  Service: ENT;;   ULTRASOUND GUIDANCE FOR VASCULAR ACCESS Bilateral 08/29/2020   Procedure: ULTRASOUND GUIDANCE FOR VASCULAR ACCESS;  Surgeon: Elam Dutch, MD;  Location: [place];  Service: Vascular;  Laterality: Bilateral;   Patient Active Problem List   Diagnosis Date Noted   Mucositis due to chemotherapy 07/17/2022   Port-A-Cath in place 06/21/2022   Malignant neoplasm of supraglottis ([place]) 05/08/2022   Cancer of supraglottis ([place]) 05/04/2022   Rib pain on left side 12/01/2021   Chronic sore throat 08/15/2021   Dyslipidemia 07/17/2021   AAA (abdominal aortic aneurysm) ([place]) 08/29/2020   PAD (peripheral artery disease) ([place]) 06/25/2020   Educated about COVID-19 virus infection 05/12/2020   Tobacco abuse 05/12/2020   Peripheral vascular disease ([place]) 04/25/2020   Abdominal aortic aneurysm (AAA) ([place]) 04/25/2020   Essential  hypertension 05/16/2018   Medication management 05/16/2018   Piriformis syndrome of right side 05/06/2018   Allergy 10/29/2017   Recurrent sinusitis 10/29/2017   Atrial fibrillation, transient ([place]) 06/07/2015   Aortic stenosis    ABNORMAL ELECTROCARDIOGRAM 07/14/2009    PCP: Carolann Littler, MD  REFERRING PROVIDER: [name], MD  REFERRING DIAG: C32.1 (ICD-10-CM) - Malignant neoplasm of supraglottis ([place])   THERAPY DIAG:  Lymphedema, not elsewhere classified  Abnormal posture  ONSET DATE: 11/08/21  Rationale for Evaluation and Treatment: Rehabilitation  SUBJECTIVE:  SUBJECTIVE STATEMENT: The swelling began 2 to 3 weeks ago. I hadn't noticed it but Glendell Docker noticed it.   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 He presented to Dr. Redmond Baseman on 08/15/21 with c/o persistent left sided sore throat since August 2022. Laryngoscopy completed at this time was unremarkable. 11/08/21 CT neck revealed a sub-centimeter focus of mucosal hyperenhancement along the posterior aspect of the uvula, and a few tiny cystic appearing foci along the left glosso-tonsillar sulcus. CT also showed a nonspecific mildly enlarged right level 2 lymph node measuring 14 mm in the short axis, and multiple small calcific foci within the left palatine tonsil possibly reflective of postinflammatory calcifications and/or tonsilloliths. 11/21/21 repeat Laryngoscopy by Dr. Redmond Baseman again was unremarkable. Due to persistent throat pain Dr. Redmond Baseman advised proceeding with a direct larynscopy and rigid esophagosopy with biopsies.  04/18/22 Left laryngeal biopsies showed invasive moderately differentiated SCC. 05/07/22 PET completed showing Asymmetric Hypermetabolism in the left oropharynx and involving the  left epiglottis. Imaging features compatible with primary neoplasm. (I believe some of the oropharynx SUV uptake could be r/t abscess, recent biopsy), Contralateral (right-sided) hypermetabolic level II cervical lymph nodes compatible with metastatic disease. No hypermetabolic lymphadenopathy in the left neck, and 1.2 cm short axis aortocaval lymph node in the upper abdomen is hypermetabolic. Metastatic disease is a concern. This lymph node has increased from 0.7 cm on a study from 2022. 05/21/22 Biopsy of the lymph node in his abdomen with CT simulation planned for 05/23/22. Treatment plan will depend on results of the biopsy on 10/16 but will most likely be weekly chemo and 35 fractions of radiation. He will receive 35 fractions of radiation and weekly chemotherapy to his larynx and bilateral neck.  He is scheduled to start on 05/30/22 and complete 07/19/22  PAIN:  Are you having pain? No  PRECAUTIONS: Other: neck lymphedema  WEIGHT BEARING RESTRICTIONS: No  FALLS:  Has patient fallen in last 6 months? 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  LEISURE: walks 20-30 min/day  HAND DOMINANCE: right   PRIOR LEVEL OF FUNCTION: Independent  PATIENT GOALS: to get rid of the swelling   OBJECTIVE:  COGNITION: Overall cognitive status: Within functional limits for tasks assessed   PALPATION: Fullness palpable at anterior neck  OBSERVATIONS / OTHER ASSESSMENTS: fullness at anterior neck  POSTURE: forward head/rounded shoulders   CERVICAL AROM: All within normal limits:    Percent limited  Flexion WFL  Extension WFL  Right lateral flexion WFL  Left lateral flexion WFL  Right rotation [facility]  Left rotation [facility]    LYMPHEDEMA  ASSESSMENT:      Circumference in cm 08/03/23 09/19/22  4 cm superior to sternal notch around neck 39.6 43.6  6 cm superior to sternal notch around neck 40.9 44.5  8 cm superior to sternal notch around neck 41 46  R lateral nostril from base of nose to medial tragus     L lateral nostril from base of nose to medial tragus     R corner of mouth to where ear lobe meets face     L corner of mouth to where ear lobe meets face                 (  Blank rows=not tested)   CURRENT/PAST TREATMENTS:   Surgery type/date: Several biopsies in Oct 2023   Chemotherapy: Completed 07/19/22   Radiation: Completed 07/19/22  LLIS: 4   TODAY'S TREATMENT:                                                                                                                                         DATE:  09/19/22- Instructed pt using Norton anterior approach handout as follows: short neck, 5 diaphragmatic breaths, bilateral axillary nodes, bilateral pectoral nodes, anterior chest, short neck, posterior neck moving fluid towards pathway aimed at lateral neck, then lateral and anterior neck moving fluid towards pathway aimed at lateral neck then retracing all steps  while educating pt in anatomy and physiology of the lymphatic system and basic principles of MLD Showed pt the tribute head and neck garment and pt was interested and requesting his demographics be sent to [facility] to check for insurance coverage Made chip pack for pt to wear to help reduce edema  PATIENT EDUCATION:  Education details: anatomy and physiology of the lymphatic system, basic principles of MLD, compression garment/chip pack Person educated: Patient Education method: Customer service manager Education comprehension: verbalized understanding  HOME EXERCISE PROGRAM: Wear chip pack as much as possible  ASSESSMENT:  CLINICAL IMPRESSION: Patient is a 77 y.o. male who was seen today for physical therapy evaluation and treatment for neck  lymphedema. Pt returns to PT after developing anterior neck lymphedema. He completed radiation in Dec 2023. There is no fibrosis but is neck is full and is circumferences have increased 5 cm from baseline. Pt would benefit from skilled PT services to decrease anterior neck lymphedema, obtain garments and progress pt towards independent management of lymphedema.    OBJECTIVE IMPAIRMENTS: decreased knowledge of condition, decreased knowledge of use of DME, increased edema, and postural dysfunction.   ACTIVITY LIMITATIONS:  none  PARTICIPATION LIMITATIONS:  none  PERSONAL FACTORS:  none  are also affecting patient's functional outcome.   REHAB POTENTIAL: Good  CLINICAL DECISION MAKING: Stable/uncomplicated  EVALUATION COMPLEXITY: Low  GOALS: Goals reviewed with patient? Yes  SHORT TERM GOALS=LONG TERM GOALS Target date: 09/18/22  Pt will be independent in self MLD for long term management of lymphedema.  Baseline: Goal status: INITIAL  2.  Pt will obtain appropriate compression garments for management of neck lymphedema.  Baseline:  Goal status: INITIAL  3.  Pt will demonstrate a 2 cm decrease at 8 cm superior to sternal notch to decrease risk of infection.  Baseline:  Goal status: INITIAL  PLAN:  PT FREQUENCY: 2x/week  PT DURATION: 4 weeks  PLANNED INTERVENTIONS: Therapeutic exercises, Therapeutic activity, Patient/Family education, Self Care, Orthotic/Fit training, Manual lymph drainage, Compression bandaging, Taping, Vasopneumatic device, Manual therapy, and Re-evaluation  PLAN FOR NEXT SESSION: instruct pt in MLD for head and neck using Norton anterior approach handout, if signed script back then submit to  [facility] for tribute head and neck garment   [employer], PT 09/19/2022, 12:42 PM

## 2022-09-19 ENCOUNTER — Encounter: Payer: Self-pay | Admitting: Physical Therapy

## 2022-09-19 ENCOUNTER — Ambulatory Visit: Payer: Medicare HMO | Attending: Radiation Oncology | Admitting: Physical Therapy

## 2022-09-19 ENCOUNTER — Other Ambulatory Visit: Payer: Self-pay

## 2022-09-19 DIAGNOSIS — R1310 Dysphagia, unspecified: Secondary | ICD-10-CM | POA: Insufficient documentation

## 2022-09-19 DIAGNOSIS — I890 Lymphedema, not elsewhere classified: Secondary | ICD-10-CM | POA: Insufficient documentation

## 2022-09-19 DIAGNOSIS — C321 Malignant neoplasm of supraglottis: Secondary | ICD-10-CM | POA: Diagnosis not present

## 2022-09-19 DIAGNOSIS — C44619 Basal cell carcinoma of skin of left upper limb, including shoulder: Secondary | ICD-10-CM | POA: Diagnosis not present

## 2022-09-19 DIAGNOSIS — D485 Neoplasm of uncertain behavior of skin: Secondary | ICD-10-CM | POA: Diagnosis not present

## 2022-09-19 DIAGNOSIS — R293 Abnormal posture: Secondary | ICD-10-CM | POA: Insufficient documentation

## 2022-09-19 DIAGNOSIS — C44622 Squamous cell carcinoma of skin of right upper limb, including shoulder: Secondary | ICD-10-CM | POA: Diagnosis not present

## 2022-09-24 ENCOUNTER — Ambulatory Visit: Payer: Medicare HMO | Admitting: Physical Therapy

## 2022-09-24 ENCOUNTER — Encounter: Payer: Self-pay | Admitting: Physical Therapy

## 2022-09-24 DIAGNOSIS — R293 Abnormal posture: Secondary | ICD-10-CM | POA: Diagnosis not present

## 2022-09-24 DIAGNOSIS — C321 Malignant neoplasm of supraglottis: Secondary | ICD-10-CM | POA: Diagnosis not present

## 2022-09-24 DIAGNOSIS — I890 Lymphedema, not elsewhere classified: Secondary | ICD-10-CM

## 2022-09-24 DIAGNOSIS — R1310 Dysphagia, unspecified: Secondary | ICD-10-CM | POA: Diagnosis not present

## 2022-09-24 NOTE — Therapy (Signed)
OUTPATIENT PHYSICAL THERAPY ONCOLOGY TREATMENT  Patient Name: Cory [name] MRN: [id number] DOB:1946-05-19, 78 y.o., male Today's Date: 09/24/2022  END OF SESSION:  PT End of Session - 09/24/22 1206     Visit Number 2    Number of Visits 9    Date for PT Re-Evaluation 10/17/22    PT Start Time 1206    PT Stop Time 1242    PT Time Calculation (min) 36 min    Activity Tolerance Patient tolerated treatment well    Behavior During Therapy [facility] for tasks assessed/performed             Past Medical History:  Diagnosis Date   Aortic stenosis    a. Moderate by echo 05/2020.   Arthritis    right hip   Cancer ([place])    skin cancer nose, right left, upper left basel cell   Dysrhythmia    a-fib   PAF (paroxysmal atrial fibrillation) (HCC)    Peripheral arterial disease (HCC)    Tobacco abuse    Past Surgical History:  Procedure Laterality Date   ABDOMINAL AORTIC ENDOVASCULAR STENT GRAFT N/A 08/29/2020   Procedure: ABDOMINAL AORTIC ENDOVASCULAR STENT GRAFT;  Surgeon: Elam Dutch, MD;  Location: [place];  Service: Vascular;  Laterality: N/A;   ABDOMINAL AORTOGRAM W/LOWER EXTREMITY Bilateral 06/24/2020   Procedure: ABDOMINAL AORTOGRAM W/LOWER EXTREMITY;  Surgeon: Elam Dutch, MD;  Location: [place] [facility];  Service: Cardiovascular;  Laterality: Bilateral;   BACK SURGERY     2005   BIOPSY  05/21/2022   Procedure: BIOPSY;  Surgeon: Rush Landmark Telford Nab., MD;  Location: [facility];  Service: Gastroenterology;;   CERVICAL SPINE SURGERY     DIRECT LARYNGOSCOPY Bilateral 04/18/2022   Procedure: DIRECT LARYNGOSCOPY WITH BIOPSIES;  Surgeon: Melida Quitter, MD;  Location: [place];  Service: ENT;  Laterality: Bilateral;   EMBOLECTOMY Right 08/29/2020   Procedure: POPLITEAL AND TIBIAL EMBOLECTOMY;  Surgeon: Elam Dutch, MD;  Location: [place];  Service: Vascular;  Laterality: Right;   EMBOLIZATION Right 06/24/2020   Procedure: EMBOLIZATION;  Surgeon: Elam Dutch, MD;   Location: [place] [facility];  Service: Cardiovascular;  Laterality: Right;   ENDARTERECTOMY FEMORAL Right 08/29/2020   Procedure: ENDARTERECTOMY FEMORAL;  Surgeon: Elam Dutch, MD;  Location: [facility];  Service: Vascular;  Laterality: Right;   ESOPHAGOGASTRODUODENOSCOPY N/A 06/27/2022   Procedure: ESOPHAGOGASTRODUODENOSCOPY (EGD);  Surgeon: Irving Copas., MD;  Location: [facility];  Service: Gastroenterology;  Laterality: N/A;   ESOPHAGOGASTRODUODENOSCOPY (EGD) WITH PROPOFOL N/A 05/21/2022   Procedure: ESOPHAGOGASTRODUODENOSCOPY (EGD) WITH PROPOFOL;  Surgeon: Rush Landmark Telford Nab., MD;  Location: [facility];  Service: Gastroenterology;  Laterality: N/A;   EUS N/A 05/21/2022   Procedure: UPPER ENDOSCOPIC ULTRASOUND (EUS) RADIAL;  Surgeon: Irving Copas., MD;  Location: [facility];  Service: Gastroenterology;  Laterality: N/A;   EUS N/A 06/27/2022   Procedure: UPPER ENDOSCOPIC ULTRASOUND (EUS) RADIAL;  Surgeon: Irving Copas., MD;  Location: [facility];  Service: Gastroenterology;  Laterality: N/A;   EYE SURGERY     FEMORAL ARTERY EXPLORATION Right 06/25/2020   Procedure: RIGHT GROIN EXPLORATION Evacuation of Hematoma  WITH REPAIR OF RIGHT Common FEMORAL ARTERY.;  Surgeon: Angelia Mould, MD;  Location: [place];  Service: Vascular;  Laterality: Right;   FEMORAL-POPLITEAL BYPASS GRAFT Left 11/22/2020   Procedure: LEFT ABOVE KNEE-BELOW KNEE BYPASS TIBIAL PERITONEAL TRUNK REVERSE IPSILATERAL GREATER SAPHENOUS VEIN;  Surgeon: Elam Dutch, MD;  Location: [place];  Service: Vascular;  Laterality:  Left;   FINE NEEDLE ASPIRATION N/A 05/21/2022   Procedure: FINE NEEDLE ASPIRATION (FNA) LINEAR;  Surgeon: Irving Copas., MD;  Location: [facility];  Service: Gastroenterology;  Laterality: N/A;   FINE NEEDLE ASPIRATION  06/27/2022   Procedure: FINE NEEDLE ASPIRATION;  Surgeon: Rush Landmark Telford Nab., MD;  Location: [facility];  Service:  Gastroenterology;;   HOT HEMOSTASIS N/A 06/27/2022   Procedure: HOT HEMOSTASIS (ARGON PLASMA COAGULATION/BICAP);  Surgeon: Irving Copas., MD;  Location: [facility];  Service: Gastroenterology;  Laterality: N/A;   IR GASTROSTOMY TUBE MOD SED  06/12/2022   IR GASTROSTOMY TUBE REMOVAL  09/04/2022   IR IMAGING GUIDED PORT INSERTION  06/12/2022   LEG SURGERY     Trauma   LOWER EXTREMITY ANGIOGRAPHY Left 11/17/2020   Procedure: Lower Extremity Angiography;  Surgeon: Marty Heck, MD;  Location: [place] [facility];  Service: Cardiovascular;  Laterality: Left;   PATCH ANGIOPLASTY Right 08/29/2020   Procedure: PATCH ANGIOPLASTY RIGHT BELOW KNEE POPLITEAL ARTERY AND RIGHT COMMON FEMORAL ARTERY;  Surgeon: Elam Dutch, MD;  Location: [facility] OR;  Service: Vascular;  Laterality: Right;   RETINAL DETACHMENT SURGERY     RIGID ESOPHAGOSCOPY  04/18/2022   Procedure: RIGID ESOPHAGOSCOPY;  Surgeon: Melida Quitter, MD;  Location: [place];  Service: ENT;;   TONSILLECTOMY  04/18/2022   Procedure: LEFT TONSILLECTOMY;  Surgeon: Melida Quitter, MD;  Location: [place];  Service: ENT;;   ULTRASOUND GUIDANCE FOR VASCULAR ACCESS Bilateral 08/29/2020   Procedure: ULTRASOUND GUIDANCE FOR VASCULAR ACCESS;  Surgeon: Elam Dutch, MD;  Location: [place];  Service: Vascular;  Laterality: Bilateral;   Patient Active Problem List   Diagnosis Date Noted   Mucositis due to chemotherapy 07/17/2022   Port-A-Cath in place 06/21/2022   Malignant neoplasm of supraglottis ([place]) 05/08/2022   Cancer of supraglottis ([place]) 05/04/2022   Rib pain on left side 12/01/2021   Chronic sore throat 08/15/2021   Dyslipidemia 07/17/2021   AAA (abdominal aortic aneurysm) ([place]) 08/29/2020   PAD (peripheral artery disease) ([place]) 06/25/2020   Educated about COVID-19 virus infection 05/12/2020   Tobacco abuse 05/12/2020   Peripheral vascular disease ([place]) 04/25/2020   Abdominal aortic aneurysm (AAA) ([place]) 04/25/2020   Essential  hypertension 05/16/2018   Medication management 05/16/2018   Piriformis syndrome of right side 05/06/2018   Allergy 10/29/2017   Recurrent sinusitis 10/29/2017   Atrial fibrillation, transient ([place]) 06/07/2015   Aortic stenosis    ABNORMAL ELECTROCARDIOGRAM 07/14/2009    PCP: Carolann Littler, MD  REFERRING PROVIDER: [name], MD  REFERRING DIAG: C32.1 (ICD-10-CM) - Malignant neoplasm of supraglottis ([place])   THERAPY DIAG:  Lymphedema, not elsewhere classified  Abnormal posture  Malignant neoplasm of supraglottis ([place])  ONSET DATE: 11/08/21  Rationale for Evaluation and Treatment: Rehabilitation  SUBJECTIVE:  SUBJECTIVE STATEMENT: I have been wearing the chip pack and I think it is helping. I have not tried to sleep with it on yet.   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 He presented to Dr. Redmond Baseman on 08/15/21 with c/o persistent left sided sore throat since August 2022. Laryngoscopy completed at this time was unremarkable. 11/08/21 CT neck revealed a sub-centimeter focus of mucosal hyperenhancement along the posterior aspect of the uvula, and a few tiny cystic appearing foci along the left glosso-tonsillar sulcus. CT also showed a nonspecific mildly enlarged right level 2 lymph node measuring 14 mm in the short axis, and multiple small calcific foci within the left palatine tonsil possibly reflective of postinflammatory calcifications and/or tonsilloliths. 11/21/21 repeat Laryngoscopy by Dr. Redmond Baseman again was unremarkable. Due to persistent throat pain Dr. Redmond Baseman advised proceeding with a direct larynscopy and rigid esophagosopy with biopsies.  04/18/22 Left laryngeal biopsies showed invasive moderately differentiated SCC. 05/07/22 PET completed showing  Asymmetric Hypermetabolism in the left oropharynx and involving the left epiglottis. Imaging features compatible with primary neoplasm. (I believe some of the oropharynx SUV uptake could be r/t abscess, recent biopsy), Contralateral (right-sided) hypermetabolic level II cervical lymph nodes compatible with metastatic disease. No hypermetabolic lymphadenopathy in the left neck, and 1.2 cm short axis aortocaval lymph node in the upper abdomen is hypermetabolic. Metastatic disease is a concern. This lymph node has increased from 0.7 cm on a study from 2022. 05/21/22 Biopsy of the lymph node in his abdomen with CT simulation planned for 05/23/22. Treatment plan will depend on results of the biopsy on 10/16 but will most likely be weekly chemo and 35 fractions of radiation. He will receive 35 fractions of radiation and weekly chemotherapy to his larynx and bilateral neck.  He is scheduled to start on 05/30/22 and complete 07/19/22  PAIN:  Are you having pain? No  PRECAUTIONS: Other: neck lymphedema  WEIGHT BEARING RESTRICTIONS: No  FALLS:  Has patient fallen in last 6 months? 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  LEISURE: walks 20-30 min/day  HAND DOMINANCE: right   PRIOR LEVEL OF FUNCTION: Independent  PATIENT GOALS: to get rid of the swelling   OBJECTIVE:  COGNITION: Overall cognitive status: Within functional limits for tasks assessed   PALPATION: Fullness palpable at anterior neck  OBSERVATIONS / OTHER ASSESSMENTS: fullness at anterior neck  POSTURE: forward head/rounded shoulders   CERVICAL AROM: All within normal limits:    Percent limited  Flexion WFL  Extension WFL  Right lateral flexion WFL  Left lateral  flexion WFL  Right rotation [facility]  Left rotation [facility]    LYMPHEDEMA ASSESSMENT:      Circumference in cm 08/03/23 09/19/22  4 cm superior to sternal notch around neck 39.6 43.6  6 cm superior to sternal notch around neck 40.9 44.5  8 cm superior to sternal notch around neck 41 46  R lateral nostril from base of nose to medial tragus     L lateral nostril from base of nose to medial tragus     R corner of mouth to where ear lobe meets face     L corner of mouth to where ear lobe meets face                 (  Blank rows=not tested)   CURRENT/PAST TREATMENTS:   Surgery type/date: Several biopsies in Oct 2023   Chemotherapy: Completed 07/19/22   Radiation: Completed 07/19/22  LLIS: 4   TODAY'S TREATMENT:                                                                                                                                         DATE:  09/24/22- Instructed pt using Norton anterior approach handout as follows: short neck, 5 diaphragmatic breaths, bilateral axillary nodes, bilateral pectoral nodes, anterior chest, short neck, posterior neck moving fluid towards pathway aimed at lateral neck, then lateral and anterior neck moving fluid towards pathway aimed at lateral neck then retracing all steps. Walked pt through each step in the instructions and had him return demonstrate each step with v/c and t/c while returning therapist demo. Pt demonstrated good pressure and good skin stretch with only occasional cues to not do a complete circle and to use pressure only during first half of circle. Issued instructions for pt to practice at home.   09/19/22- Instructed pt using Norton anterior approach handout as follows: short neck, 5 diaphragmatic breaths, bilateral axillary nodes, bilateral pectoral nodes, anterior chest, short neck, posterior neck moving fluid towards pathway aimed at lateral neck, then lateral and anterior neck moving fluid towards pathway aimed at lateral neck then retracing  all steps  while educating pt in anatomy and physiology of the lymphatic system and basic principles of MLD Showed pt the tribute head and neck garment and pt was interested and requesting his demographics be sent to [facility] to check for insurance coverage Made chip pack for pt to wear to help reduce edema  PATIENT EDUCATION:  Education details: anatomy and physiology of the lymphatic system, basic principles of MLD, compression garment/chip pack Person educated: Patient Education method: Customer service manager Education comprehension: verbalized understanding  HOME EXERCISE PROGRAM: Wear chip pack as much as possible  ASSESSMENT:  CLINICAL IMPRESSION: Began instructing pt in proper self MLD technique for head and neck lymphedema and had him return demonstrate each step. By end of session he was able to demonstrate appropriate skin stretch using appropriate pressure with v/c and t/c. Issued handout for pt to begin practicing at home.   OBJECTIVE IMPAIRMENTS: decreased knowledge of condition, decreased knowledge of use of DME, increased edema, and postural dysfunction.   ACTIVITY LIMITATIONS:  none  PARTICIPATION LIMITATIONS:  none  PERSONAL FACTORS:  none  are also affecting patient's functional outcome.   REHAB POTENTIAL: Good  CLINICAL DECISION MAKING: Stable/uncomplicated  EVALUATION COMPLEXITY: Low  GOALS: Goals reviewed with patient? Yes  SHORT TERM GOALS=LONG TERM GOALS Target date: 09/18/22  Pt will be independent in self MLD for long term management of lymphedema.  Baseline: Goal status: INITIAL  2.  Pt will obtain appropriate compression garments for management of neck lymphedema.  Baseline:  Goal status: INITIAL  3.  Pt will demonstrate a 2 cm decrease at 8 cm superior to sternal notch to decrease risk of infection.  Baseline:  Goal status: INITIAL  PLAN:  PT FREQUENCY: 2x/week  PT DURATION: 4 weeks  PLANNED INTERVENTIONS: Therapeutic exercises,  Therapeutic activity, Patient/Family education, Self Care, Orthotic/Fit training, Manual lymph drainage, Compression bandaging, Taping, Vasopneumatic device, Manual therapy, and Re-evaluation  PLAN FOR NEXT SESSION: cont to instruct pt in MLD for head and neck using Norton anterior approach handout, if signed script back then submit to [facility] for tribute head and neck garment   [employer], PT 09/24/2022, 1:05 PM

## 2022-09-26 ENCOUNTER — Encounter: Payer: Medicare HMO | Admitting: Physical Therapy

## 2022-09-27 ENCOUNTER — Encounter: Payer: Self-pay | Admitting: Physical Therapy

## 2022-09-27 ENCOUNTER — Ambulatory Visit: Payer: Medicare HMO | Admitting: Physical Therapy

## 2022-09-27 DIAGNOSIS — I890 Lymphedema, not elsewhere classified: Secondary | ICD-10-CM

## 2022-09-27 DIAGNOSIS — R293 Abnormal posture: Secondary | ICD-10-CM

## 2022-09-27 DIAGNOSIS — R1310 Dysphagia, unspecified: Secondary | ICD-10-CM | POA: Diagnosis not present

## 2022-09-27 DIAGNOSIS — C321 Malignant neoplasm of supraglottis: Secondary | ICD-10-CM

## 2022-09-27 NOTE — Therapy (Signed)
OUTPATIENT PHYSICAL THERAPY ONCOLOGY TREATMENT  Patient Name: Cory [name] MRN: [id number] DOB:[birth year], 77 y.o., male Today's Date: 09/27/2022  END OF SESSION:  PT End of Session - 09/27/22 1508     Visit Number 3    Number of Visits 9    Date for PT Re-Evaluation 10/17/22    PT Start Time [id number]    PT Stop Time 1546    PT Time Calculation (min) 39 min    Activity Tolerance Patient tolerated treatment well    Behavior During Therapy [facility] for tasks assessed/performed             Past Medical History:  Diagnosis Date   Aortic stenosis    a. Moderate by echo 05/2020.   Arthritis    right hip   Cancer ([place])    skin cancer nose, right left, upper left basel cell   Dysrhythmia    a-fib   PAF (paroxysmal atrial fibrillation) (HCC)    Peripheral arterial disease (HCC)    Tobacco abuse    Past Surgical History:  Procedure Laterality Date   ABDOMINAL AORTIC ENDOVASCULAR STENT GRAFT N/A 08/29/2020   Procedure: ABDOMINAL AORTIC ENDOVASCULAR STENT GRAFT;  Surgeon: Elam Dutch, MD;  Location: [place];  Service: Vascular;  Laterality: N/A;   ABDOMINAL AORTOGRAM W/LOWER EXTREMITY Bilateral 06/24/2020   Procedure: ABDOMINAL AORTOGRAM W/LOWER EXTREMITY;  Surgeon: Elam Dutch, MD;  Location: [place] [facility];  Service: Cardiovascular;  Laterality: Bilateral;   BACK SURGERY     2005   BIOPSY  05/21/2022   Procedure: BIOPSY;  Surgeon: Rush Landmark Telford Nab., MD;  Location: [facility];  Service: Gastroenterology;;   CERVICAL SPINE SURGERY     DIRECT LARYNGOSCOPY Bilateral 04/18/2022   Procedure: DIRECT LARYNGOSCOPY WITH BIOPSIES;  Surgeon: Melida Quitter, MD;  Location: [place];  Service: ENT;  Laterality: Bilateral;   EMBOLECTOMY Right 08/29/2020   Procedure: POPLITEAL AND TIBIAL EMBOLECTOMY;  Surgeon: Elam Dutch, MD;  Location: [place];  Service: Vascular;  Laterality: Right;   EMBOLIZATION Right 06/24/2020   Procedure: EMBOLIZATION;  Surgeon: Elam Dutch, MD;   Location: [place] [facility];  Service: Cardiovascular;  Laterality: Right;   ENDARTERECTOMY FEMORAL Right 08/29/2020   Procedure: ENDARTERECTOMY FEMORAL;  Surgeon: Elam Dutch, MD;  Location: [facility];  Service: Vascular;  Laterality: Right;   ESOPHAGOGASTRODUODENOSCOPY N/A 06/27/2022   Procedure: ESOPHAGOGASTRODUODENOSCOPY (EGD);  Surgeon: Irving Copas., MD;  Location: [facility];  Service: Gastroenterology;  Laterality: N/A;   ESOPHAGOGASTRODUODENOSCOPY (EGD) WITH PROPOFOL N/A 05/21/2022   Procedure: ESOPHAGOGASTRODUODENOSCOPY (EGD) WITH PROPOFOL;  Surgeon: Rush Landmark Telford Nab., MD;  Location: [facility];  Service: Gastroenterology;  Laterality: N/A;   EUS N/A 05/21/2022   Procedure: UPPER ENDOSCOPIC ULTRASOUND (EUS) RADIAL;  Surgeon: Irving Copas., MD;  Location: [facility];  Service: Gastroenterology;  Laterality: N/A;   EUS N/A 06/27/2022   Procedure: UPPER ENDOSCOPIC ULTRASOUND (EUS) RADIAL;  Surgeon: Irving Copas., MD;  Location: [facility];  Service: Gastroenterology;  Laterality: N/A;   EYE SURGERY     FEMORAL ARTERY EXPLORATION Right 06/25/2020   Procedure: RIGHT GROIN EXPLORATION Evacuation of Hematoma  WITH REPAIR OF RIGHT Common FEMORAL ARTERY.;  Surgeon: Angelia Mould, MD;  Location: [place];  Service: Vascular;  Laterality: Right;   FEMORAL-POPLITEAL BYPASS GRAFT Left 11/22/2020   Procedure: LEFT ABOVE KNEE-BELOW KNEE BYPASS TIBIAL PERITONEAL TRUNK REVERSE IPSILATERAL GREATER SAPHENOUS VEIN;  Surgeon: Elam Dutch, MD;  Location: [place];  Service: Vascular;  Laterality:  Left;   FINE NEEDLE ASPIRATION N/A 05/21/2022   Procedure: FINE NEEDLE ASPIRATION (FNA) LINEAR;  Surgeon: Irving Copas., MD;  [facility]: [facility];  Service: Gastroenterology;  Laterality: N/A;   FINE NEEDLE ASPIRATION  06/27/2022   Procedure: FINE NEEDLE ASPIRATION;  Surgeon: Rush Landmark Telford Nab., MD;  Location: [facility];  Service:  Gastroenterology;;   HOT HEMOSTASIS N/A 06/27/2022   Procedure: HOT HEMOSTASIS (ARGON PLASMA COAGULATION/BICAP);  Surgeon: Irving Copas., MD;  Location[facility];  Service: Gastroenterology;  Laterality: N/A;   IR GASTROSTOMY TUBE MOD SED  06/12/2022   IR GASTROSTOMY TUBE REMOVAL  09/04/2022   IR IMAGING GUIDED PORT INSERTION  06/12/2022   LEG SURGERY     Trauma   LOWER EXTREMITY ANGIOGRAPHY Left 11/17/2020   Procedure: Lower Extremity Angiography;  Surgeon: Marty Heck, MD;  Location: [place] [facility];  Service: Cardiovascular;  Laterality: Left;   PATCH ANGIOPLASTY Right 08/29/2020   Procedure: PATCH ANGIOPLASTY RIGHT BELOW KNEE POPLITEAL ARTERY AND RIGHT COMMON FEMORAL ARTERY;  Surgeon: Elam Dutch, MD;  Location: [facility] OR;  Service: Vascular;  Laterality: Right;   RETINAL DETACHMENT SURGERY     RIGID ESOPHAGOSCOPY  04/18/2022   Procedure: RIGID ESOPHAGOSCOPY;  Surgeon: Melida Quitter, MD;  Location: [place];  Service: ENT;;   TONSILLECTOMY  04/18/2022   Procedure: LEFT TONSILLECTOMY;  Surgeon: Melida Quitter, MD;  Location: [place];  Service: ENT;;   ULTRASOUND GUIDANCE FOR VASCULAR ACCESS Bilateral 08/29/2020   Procedure: ULTRASOUND GUIDANCE FOR VASCULAR ACCESS;  Surgeon: Elam Dutch, MD;  Location: [place];  Service: Vascular;  Laterality: Bilateral;   Patient Active Problem List   Diagnosis Date Noted   Mucositis due to chemotherapy 07/17/2022   Port-A-Cath in place 06/21/2022   Malignant neoplasm of supraglottis ([place]) 05/08/2022   Cancer of supraglottis ([place]) 05/04/2022   Rib pain on left side 12/01/2021   Chronic sore throat 08/15/2021   Dyslipidemia 07/17/2021   AAA (abdominal aortic aneurysm) ([place]) 08/29/2020   PAD (peripheral artery disease) ([place]) 06/25/2020   Educated about COVID-19 virus infection 05/12/2020   Tobacco abuse 05/12/2020   Peripheral vascular disease ([place]) 04/25/2020   Abdominal aortic aneurysm (AAA) ([place]) 04/25/2020   Essential  hypertension 05/16/2018   Medication management 05/16/2018   Piriformis syndrome of right side 05/06/2018   Allergy 10/29/2017   Recurrent sinusitis 10/29/2017   Atrial fibrillation, transient ([place]) 06/07/2015   Aortic stenosis    ABNORMAL ELECTROCARDIOGRAM 07/14/2009    PCP: Carolann Littler, MD  REFERRING PROVIDER: [name], MD  REFERRING DIAG: C32.1 (ICD-10-CM) - Malignant neoplasm of supraglottis ([place])   THERAPY DIAG:  Lymphedema, not elsewhere classified  Abnormal posture  Malignant neoplasm of supraglottis ([place])  ONSET DATE: 11/08/21  Rationale for Evaluation and Treatment: Rehabilitation  SUBJECTIVE:  SUBJECTIVE STATEMENT: I have been working on the self massage every day twice a day.   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 He presented to Dr. Redmond Baseman on 08/15/21 with c/o persistent left sided sore throat since August 2022. Laryngoscopy completed at this time was unremarkable. 11/08/21 CT neck revealed a sub-centimeter focus of mucosal hyperenhancement along the posterior aspect of the uvula, and a few tiny cystic appearing foci along the left glosso-tonsillar sulcus. CT also showed a nonspecific mildly enlarged right level 2 lymph node measuring 14 mm in the short axis, and multiple small calcific foci within the left palatine tonsil possibly reflective of postinflammatory calcifications and/or tonsilloliths. 11/21/21 repeat Laryngoscopy by Dr. Redmond Baseman again was unremarkable. Due to persistent throat pain Dr. Redmond Baseman advised proceeding with a direct larynscopy and rigid esophagosopy with biopsies.  04/18/22 Left laryngeal biopsies showed invasive moderately differentiated SCC. 05/07/22 PET completed showing Asymmetric Hypermetabolism in the left  oropharynx and involving the left epiglottis. Imaging features compatible with primary neoplasm. (I believe some of the oropharynx SUV uptake could be r/t abscess, recent biopsy), Contralateral (right-sided) hypermetabolic level II cervical lymph nodes compatible with metastatic disease. No hypermetabolic lymphadenopathy in the left neck, and 1.2 cm short axis aortocaval lymph node in the upper abdomen is hypermetabolic. Metastatic disease is a concern. This lymph node has increased from 0.7 cm on a study from 2022. 05/21/22 Biopsy of the lymph node in his abdomen with CT simulation planned for 05/23/22. Treatment plan will depend on results of the biopsy on 10/16 but will most likely be weekly chemo and 35 fractions of radiation. He will receive 35 fractions of radiation and weekly chemotherapy to his larynx and bilateral neck.  He is scheduled to start on 05/30/22 and complete 07/19/22  PAIN:  Are you having pain? No  PRECAUTIONS: Other: neck lymphedema  WEIGHT BEARING RESTRICTIONS: No  FALLS:  Has patient fallen in last 6 months? 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  LEISURE: walks 20-30 min/day  HAND DOMINANCE: right   PRIOR LEVEL OF FUNCTION: Independent  PATIENT GOALS: to get rid of the swelling   OBJECTIVE:  COGNITION: Overall cognitive status: Within functional limits for tasks assessed   PALPATION: Fullness palpable at anterior neck  OBSERVATIONS / OTHER ASSESSMENTS: fullness at anterior neck  POSTURE: forward head/rounded shoulders   CERVICAL AROM: All within normal limits:    Percent limited  Flexion WFL  Extension WFL  Right lateral flexion WFL  Left lateral flexion WFL  Right rotation [facility]  Left  rotation [facility]    LYMPHEDEMA ASSESSMENT:      Circumference in cm 08/03/23 09/19/22 09/27/22  4 cm superior to sternal notch around neck 39.6 43.6 43.9  6 cm superior to sternal notch around neck 40.9 44.5 44.5  8 cm superior to sternal notch around neck 41 46 45.1  R lateral nostril from base of nose to medial tragus      L lateral nostril from base of nose to medial tragus      R corner of mouth to where ear lobe meets face      L corner of mouth to where ear lobe meets face                    (  Blank rows=not tested)   CURRENT/PAST TREATMENTS:   Surgery type/date: Several biopsies in Oct 2023   Chemotherapy: Completed 07/19/22   Radiation: Completed 07/19/22  LLIS: 4   TODAY'S TREATMENT:                                                                                                                                         DATE:  09/27/22- had pt demonstrate entire sequence today while therapist provided v/c and t/c cues as need: short neck, 5 diaphragmatic breaths, bilateral axillary nodes, bilateral pectoral nodes, anterior chest, short neck, posterior neck moving fluid towards pathway aimed at lateral neck, then lateral and anterior neck moving fluid towards pathway aimed at lateral neck then retracing all steps. Pt require min to mod cueing for correct circle motion including the working phase and resting phase but pt was able to demonstrate correct seuqence by following the instructions  09/24/22- Instructed pt using [facility] anterior approach handout as follows: short neck, 5 diaphragmatic breaths, bilateral axillary nodes, bilateral pectoral nodes, anterior chest, short neck, posterior neck moving fluid towards pathway aimed at lateral neck, then lateral and anterior neck moving fluid towards pathway aimed at lateral neck then retracing all steps. Walked pt through each step in the instructions and had him return demonstrate each step with v/c and t/c while returning therapist demo. Pt  demonstrated good pressure and good skin stretch with only occasional cues to not do a complete circle and to use pressure only during first half of circle. Issued instructions for pt to practice at home.   09/19/22- Instructed pt using [employer] anterior approach handout as follows: short neck, 5 diaphragmatic breaths, bilateral axillary nodes, bilateral pectoral nodes, anterior chest, short neck, posterior neck moving fluid towards pathway aimed at lateral neck, then lateral and anterior neck moving fluid towards pathway aimed at lateral neck then retracing all steps  while educating pt in anatomy and physiology of the lymphatic system and basic principles of MLD Showed pt the [employer] head and neck garment and pt was interested and requesting his demographics be sent to [facility] to check for insurance coverage Made chip pack for pt to wear to help reduce edema  PATIENT EDUCATION:  Education details: anatomy and physiology of the lymphatic system, basic principles of MLD, compression garment/chip pack Person educated: Patient Education method: Customer service manager Education comprehension: verbalized understanding  HOME EXERCISE PROGRAM: Wear chip pack as much as possible  ASSESSMENT:  CLINICAL IMPRESSION: Had pt follow his instructions and go through self MLD using [employer] and provided v/c and t/c as needed to progress pt towards independence in self MLD. Pt was able to demonstrate correct sequence but still required min to mod cueing to correct skin stretch technique. His neck circumference 8 cm superior to sternal notch has decreased a cm in a week.   OBJECTIVE IMPAIRMENTS: decreased knowledge of condition, decreased knowledge of  use of DME, increased edema, and postural dysfunction.   ACTIVITY LIMITATIONS:  none  PARTICIPATION LIMITATIONS:  none  PERSONAL FACTORS:  none  are also affecting patient's functional outcome.   REHAB POTENTIAL: Good  CLINICAL DECISION MAKING:  Stable/uncomplicated  EVALUATION COMPLEXITY: Low  GOALS: Goals reviewed with patient? Yes  SHORT TERM GOALS=LONG TERM GOALS Target date: 09/18/22  Pt will be independent in self MLD for long term management of lymphedema.  Baseline: Goal status: INITIAL  2.  Pt will obtain appropriate compression garments for management of neck lymphedema.  Baseline:  Goal status: INITIAL  3.  Pt will demonstrate a 2 cm decrease at 8 cm superior to sternal notch to decrease risk of infection.  Baseline:  Goal status: INITIAL  PLAN:  PT FREQUENCY: 2x/week  PT DURATION: 4 weeks  PLANNED INTERVENTIONS: Therapeutic exercises, Therapeutic activity, Patient/Family education, Self Care, Orthotic/Fit training, Manual lymph drainage, Compression bandaging, Taping, Vasopneumatic device, Manual therapy, and Re-evaluation  PLAN FOR NEXT SESSION: cont to instruct pt in MLD for head and neck using [employer] anterior approach handout, if signed script back then submit to [facility] for tribute head and neck garment   [employer], PT 09/27/2022, 3:52 PM

## 2022-10-01 ENCOUNTER — Encounter: Payer: Self-pay | Admitting: Rehabilitation

## 2022-10-01 ENCOUNTER — Ambulatory Visit: Payer: Medicare HMO | Admitting: Rehabilitation

## 2022-10-01 DIAGNOSIS — R1310 Dysphagia, unspecified: Secondary | ICD-10-CM | POA: Diagnosis not present

## 2022-10-01 DIAGNOSIS — I890 Lymphedema, not elsewhere classified: Secondary | ICD-10-CM | POA: Diagnosis not present

## 2022-10-01 DIAGNOSIS — R293 Abnormal posture: Secondary | ICD-10-CM

## 2022-10-01 DIAGNOSIS — C321 Malignant neoplasm of supraglottis: Secondary | ICD-10-CM

## 2022-10-01 NOTE — Therapy (Signed)
OUTPATIENT PHYSICAL THERAPY ONCOLOGY TREATMENT  Patient Name: Cory [name] MRN: [id number] DOB:19-Jul-1946, 78 y.o., male Today's Date: 10/01/2022  END OF SESSION:  PT End of Session - 10/01/22 1158     Visit Number 4    Number of Visits 9    Date for PT Re-Evaluation 10/17/22    PT Start Time 1200    PT Stop Time 1240    PT Time Calculation (min) 40 min    Activity Tolerance Patient tolerated treatment well    Behavior During Therapy [facility] for tasks assessed/performed             Past Medical History:  Diagnosis Date   Aortic stenosis    a. Moderate by echo 05/2020.   Arthritis    right hip   Cancer ([place])    skin cancer nose, right left, upper left basel cell   Dysrhythmia    a-fib   PAF (paroxysmal atrial fibrillation) (HCC)    Peripheral arterial disease (HCC)    Tobacco abuse    Past Surgical History:  Procedure Laterality Date   ABDOMINAL AORTIC ENDOVASCULAR STENT GRAFT N/A 08/29/2020   Procedure: ABDOMINAL AORTIC ENDOVASCULAR STENT GRAFT;  Surgeon: Elam Dutch, MD;  Location: [place];  Service: Vascular;  Laterality: N/A;   ABDOMINAL AORTOGRAM W/LOWER EXTREMITY Bilateral 06/24/2020   Procedure: ABDOMINAL AORTOGRAM W/LOWER EXTREMITY;  Surgeon: Elam Dutch, MD;  Location: [place] [facility];  Service: Cardiovascular;  Laterality: Bilateral;   BACK SURGERY     2005   BIOPSY  05/21/2022   Procedure: BIOPSY;  Surgeon: Rush Landmark Telford Nab., MD;  Location: [facility];  Service: Gastroenterology;;   CERVICAL SPINE SURGERY     DIRECT LARYNGOSCOPY Bilateral 04/18/2022   Procedure: DIRECT LARYNGOSCOPY WITH BIOPSIES;  Surgeon: Melida Quitter, MD;  Location: [place];  Service: ENT;  Laterality: Bilateral;   EMBOLECTOMY Right 08/29/2020   Procedure: POPLITEAL AND TIBIAL EMBOLECTOMY;  Surgeon: Elam Dutch, MD;  Location: [place];  Service: Vascular;  Laterality: Right;   EMBOLIZATION Right 06/24/2020   Procedure: EMBOLIZATION;  Surgeon: Elam Dutch, MD;   Location: [place] [facility];  Service: Cardiovascular;  Laterality: Right;   ENDARTERECTOMY FEMORAL Right 08/29/2020   Procedure: ENDARTERECTOMY FEMORAL;  Surgeon: Elam Dutch, MD;  [facility];  Service: Vascular;  Laterality: Right;   ESOPHAGOGASTRODUODENOSCOPY N/A 06/27/2022   Procedure: ESOPHAGOGASTRODUODENOSCOPY (EGD);  Surgeon: Irving Copas., MD;  Location: [facility];  Service: Gastroenterology;  Laterality: N/A;   ESOPHAGOGASTRODUODENOSCOPY (EGD) WITH PROPOFOL N/A 05/21/2022   Procedure: ESOPHAGOGASTRODUODENOSCOPY (EGD) WITH PROPOFOL;  Surgeon: Rush Landmark Telford Nab., MD;  Location: [facility];  Service: Gastroenterology;  Laterality: N/A;   EUS N/A 05/21/2022   Procedure: UPPER ENDOSCOPIC ULTRASOUND (EUS) RADIAL;  Surgeon: Irving Copas., MD;  Location: [facility];  Service: Gastroenterology;  Laterality: N/A;   EUS N/A 06/27/2022   Procedure: UPPER ENDOSCOPIC ULTRASOUND (EUS) RADIAL;  Surgeon: Irving Copas., MD;  Location: [facility];  Service: Gastroenterology;  Laterality: N/A;   EYE SURGERY     FEMORAL ARTERY EXPLORATION Right 06/25/2020   Procedure: RIGHT GROIN EXPLORATION Evacuation of Hematoma  WITH REPAIR OF RIGHT Common FEMORAL ARTERY.;  Surgeon: Angelia Mould, MD;  Location: [place];  Service: Vascular;  Laterality: Right;   FEMORAL-POPLITEAL BYPASS GRAFT Left 11/22/2020   Procedure: LEFT ABOVE KNEE-BELOW KNEE BYPASS TIBIAL PERITONEAL TRUNK REVERSE IPSILATERAL GREATER SAPHENOUS VEIN;  Surgeon: Elam Dutch, MD;  Location: [place];  Service: Vascular;  Laterality:  Left;   FINE NEEDLE ASPIRATION N/A 05/21/2022   Procedure: FINE NEEDLE ASPIRATION (FNA) LINEAR;  Surgeon: Irving Copas., MD;  Location: [facility];  Service: Gastroenterology;  Laterality: N/A;   FINE NEEDLE ASPIRATION  06/27/2022   Procedure: FINE NEEDLE ASPIRATION;  Surgeon: Rush Landmark Telford Nab., MD;  Location: [facility];  Service:  Gastroenterology;;   HOT HEMOSTASIS N/A 06/27/2022   Procedure: HOT HEMOSTASIS (ARGON PLASMA COAGULATION/BICAP);  Surgeon: Irving Copas., MD;  [facility];  Service: Gastroenterology;  Laterality: N/A;   IR GASTROSTOMY TUBE MOD SED  06/12/2022   IR GASTROSTOMY TUBE REMOVAL  09/04/2022   IR IMAGING GUIDED PORT INSERTION  06/12/2022   LEG SURGERY     Trauma   LOWER EXTREMITY ANGIOGRAPHY Left 11/17/2020   Procedure: Lower Extremity Angiography;  Surgeon: Marty Heck, MD;  Location: [place] [facility];  Service: Cardiovascular;  Laterality: Left;   PATCH ANGIOPLASTY Right 08/29/2020   Procedure: PATCH ANGIOPLASTY RIGHT BELOW KNEE POPLITEAL ARTERY AND RIGHT COMMON FEMORAL ARTERY;  Surgeon: Elam Dutch, MD;  Location: [facility];  Service: Vascular;  Laterality: Right;   RETINAL DETACHMENT SURGERY     RIGID ESOPHAGOSCOPY  04/18/2022   Procedure: RIGID ESOPHAGOSCOPY;  Surgeon: Melida Quitter, MD;  Location: [place];  Service: ENT;;   TONSILLECTOMY  04/18/2022   Procedure: LEFT TONSILLECTOMY;  Surgeon: Melida Quitter, MD;  Location: [place];  Service: ENT;;   ULTRASOUND GUIDANCE FOR VASCULAR ACCESS Bilateral 08/29/2020   Procedure: ULTRASOUND GUIDANCE FOR VASCULAR ACCESS;  Surgeon: Elam Dutch, MD;  Location: [place];  Service: Vascular;  Laterality: Bilateral;   Patient Active Problem List   Diagnosis Date Noted   Mucositis due to chemotherapy 07/17/2022   Port-A-Cath in place 06/21/2022   Malignant neoplasm of supraglottis ([place]) 05/08/2022   Cancer of supraglottis ([place]) 05/04/2022   Rib pain on left side 12/01/2021   Chronic sore throat 08/15/2021   Dyslipidemia 07/17/2021   AAA (abdominal aortic aneurysm) ([place]) 08/29/2020   PAD (peripheral artery disease) ([place]) 06/25/2020   Educated about COVID-19 virus infection 05/12/2020   Tobacco abuse 05/12/2020   Peripheral vascular disease ([place]) 04/25/2020   Abdominal aortic aneurysm (AAA) ([place]) 04/25/2020   Essential  hypertension 05/16/2018   Medication management 05/16/2018   Piriformis syndrome of right side 05/06/2018   Allergy 10/29/2017   Recurrent sinusitis 10/29/2017   Atrial fibrillation, transient ([place]) 06/07/2015   Aortic stenosis    ABNORMAL ELECTROCARDIOGRAM 07/14/2009    PCP: Carolann Littler, MD  REFERRING PROVIDER: [name], MD  REFERRING DIAG: C32.1 (ICD-10-CM) - Malignant neoplasm of supraglottis ([place])   THERAPY DIAG:  Lymphedema, not elsewhere classified  Abnormal posture  Malignant neoplasm of supraglottis ([place])  Dysphagia, unspecified type  ONSET DATE: 11/08/21  Rationale for Evaluation and Treatment: Rehabilitation  SUBJECTIVE:  SUBJECTIVE STATEMENT: Nothing new. Denies questions on self MLD and reports it is going well.   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 He presented to Dr. Redmond Baseman on 08/15/21 with c/o persistent left sided sore throat since August 2022. Laryngoscopy completed at this time was unremarkable. 11/08/21 CT neck revealed a sub-centimeter focus of mucosal hyperenhancement along the posterior aspect of the uvula, and a few tiny cystic appearing foci along the left glosso-tonsillar sulcus. CT also showed a nonspecific mildly enlarged right level 2 lymph node measuring 14 mm in the short axis, and multiple small calcific foci within the left palatine tonsil possibly reflective of postinflammatory calcifications and/or tonsilloliths. 11/21/21 repeat Laryngoscopy by Dr. Redmond Baseman again was unremarkable. Due to persistent throat pain Dr. Redmond Baseman advised proceeding with a direct larynscopy and rigid esophagosopy with biopsies.  04/18/22 Left laryngeal biopsies showed invasive moderately differentiated SCC. 05/07/22 PET completed showing  Asymmetric Hypermetabolism in the left oropharynx and involving the left epiglottis. Imaging features compatible with primary neoplasm. (I believe some of the oropharynx SUV uptake could be r/t abscess, recent biopsy), Contralateral (right-sided) hypermetabolic level II cervical lymph nodes compatible with metastatic disease. No hypermetabolic lymphadenopathy in the left neck, and 1.2 cm short axis aortocaval lymph node in the upper abdomen is hypermetabolic. Metastatic disease is a concern. This lymph node has increased from 0.7 cm on a study from 2022. 05/21/22 Biopsy of the lymph node in his abdomen with CT simulation planned for 05/23/22. Treatment plan will depend on results of the biopsy on 10/16 but will most likely be weekly chemo and 35 fractions of radiation. He will receive 35 fractions of radiation and weekly chemotherapy to his larynx and bilateral neck.  He is scheduled to start on 05/30/22 and complete 07/19/22  PAIN:  Are you having pain? No  PRECAUTIONS: Other: neck lymphedema  WEIGHT BEARING RESTRICTIONS: No  FALLS:  Has patient fallen in last 6 months? 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  LEISURE: walks 20-30 min/day  HAND DOMINANCE: right   PRIOR LEVEL OF FUNCTION: Independent  PATIENT GOALS: to get rid of the swelling   OBJECTIVE:  COGNITION: Overall cognitive status: Within functional limits for tasks assessed   PALPATION: Fullness palpable at anterior neck  OBSERVATIONS / OTHER ASSESSMENTS: fullness at anterior neck  POSTURE: forward head/rounded shoulders   CERVICAL AROM: All within normal limits:    Percent limited  Flexion WFL  Extension WFL  Right lateral flexion WFL  Left lateral  flexion WFL  Right rotation [facility]  Left rotation [facility]    LYMPHEDEMA ASSESSMENT:      Circumference in cm 08/03/23 09/19/22 09/27/22 10/01/22  4 cm superior to sternal notch around neck 39.6 43.6 43.9 43.5  6 cm superior to sternal notch around neck 40.9 44.5 44.5 45  8 cm superior to sternal notch around neck 41 46 45.1 45.2  R lateral nostril from base of nose to medial tragus       L lateral nostril from base of nose to medial tragus       R corner of mouth to where ear lobe meets face       L corner of mouth to where ear lobe meets face                       (  Blank rows=not tested)   CURRENT/PAST TREATMENTS:   Surgery type/date: Several biopsies in Oct 2023   Chemotherapy: Completed 07/19/22   Radiation: Completed 07/19/22  LLIS: 4   TODAY'S TREATMENT:                                                                                                                                         DATE:  10/01/22- short neck, 5 diaphragmatic breaths, bilateral axillary nodes, bilateral pectoral nodes, anterior chest, short neck, posterior neck moving fluid towards pathway aimed at lateral neck, then lateral and anterior neck moving fluid towards pathway aimed at lateral neck then retracing all steps. Measured circumferences per pt request but with no significant changes.   09/27/22- had pt demonstrate entire sequence today while therapist provided v/c and t/c cues as need: short neck, 5 diaphragmatic breaths, bilateral axillary nodes, bilateral pectoral nodes, anterior chest, short neck, posterior neck moving fluid towards pathway aimed at lateral neck, then lateral and anterior neck moving fluid towards pathway aimed at lateral neck then retracing all steps. Pt require min to mod cueing for correct circle motion including the working phase and resting phase but pt was able to demonstrate correct seuqence by following the instructions  09/24/22- Instructed pt using [facility] anterior approach handout  as follows: short neck, 5 diaphragmatic breaths, bilateral axillary nodes, bilateral pectoral nodes, anterior chest, short neck, posterior neck moving fluid towards pathway aimed at lateral neck, then lateral and anterior neck moving fluid towards pathway aimed at lateral neck then retracing all steps. Walked pt through each step in the instructions and had him return demonstrate each step with v/c and t/c while returning therapist demo. Pt demonstrated good pressure and good skin stretch with only occasional cues to not do a complete circle and to use pressure only during first half of circle. Issued instructions for pt to practice at home.   09/19/22- Instructed pt using [facility] anterior approach handout as follows: short neck, 5 diaphragmatic breaths, bilateral axillary nodes, bilateral pectoral nodes, anterior chest, short neck, posterior neck moving fluid towards pathway aimed at lateral neck, then lateral and anterior neck moving fluid towards pathway aimed at lateral neck then retracing all steps  while educating pt in anatomy and physiology of the lymphatic system and basic principles of MLD Showed pt the tribute head and neck garment and pt was interested and requesting his demographics be sent to [place] to check for insurance coverage Made chip pack for pt to wear to help reduce edema  PATIENT EDUCATION:  Education details: anatomy and physiology of the lymphatic system, basic principles of MLD, compression garment/chip pack Person educated: Patient Education method: Customer service manager Education comprehension: verbalized understanding  HOME EXERCISE PROGRAM: Wear chip pack as much as possible  ASSESSMENT:  CLINICAL IMPRESSION: Pt is doing well with self MLD subjectively.  Continued with performance in clinic.   OBJECTIVE IMPAIRMENTS: decreased  knowledge of condition, decreased knowledge of use of DME, increased edema, and postural dysfunction.   ACTIVITY LIMITATIONS:   none  PARTICIPATION LIMITATIONS:  none  PERSONAL FACTORS:  none  are also affecting patient's functional outcome.   REHAB POTENTIAL: Good  CLINICAL DECISION MAKING: Stable/uncomplicated  EVALUATION COMPLEXITY: Low  GOALS: Goals reviewed with patient? Yes  SHORT TERM GOALS=LONG TERM GOALS Target date: 09/18/22  Pt will be independent in self MLD for long term management of lymphedema.  Baseline: Goal status: INITIAL  2.  Pt will obtain appropriate compression garments for management of neck lymphedema.  Baseline:  Goal status: INITIAL  3.  Pt will demonstrate a 2 cm decrease at 8 cm superior to sternal notch to decrease risk of infection.  Baseline:  Goal status: INITIAL  PLAN:  PT FREQUENCY: 2x/week  PT DURATION: 4 weeks  PLANNED INTERVENTIONS: Therapeutic exercises, Therapeutic activity, Patient/Family education, Self Care, Orthotic/Fit training, Manual lymph drainage, Compression bandaging, Taping, Vasopneumatic device, Manual therapy, and Re-evaluation  PLAN FOR NEXT SESSION: cont to instruct pt in MLD for head and neck using [facility] anterior approach handout, if signed script back then submit to [employer] [facility] for tribute head and neck garment   Stark Bray, PT 10/01/2022, 9:53 PM

## 2022-10-02 ENCOUNTER — Encounter: Payer: Self-pay | Admitting: Gastroenterology

## 2022-10-03 ENCOUNTER — Encounter: Payer: Medicare HMO | Admitting: Rehabilitation

## 2022-10-04 DIAGNOSIS — C44612 Basal cell carcinoma of skin of right upper limb, including shoulder: Secondary | ICD-10-CM | POA: Diagnosis not present

## 2022-10-08 ENCOUNTER — Ambulatory Visit: Payer: Medicare HMO | Admitting: Physical Therapy

## 2022-10-09 NOTE — Progress Notes (Signed)
Mr. Kwong presents for follow up for completion of radiation for glottic cancer. He completed treatment on 07-18-22. He will receive PET Scan results at this visit.   Pain issues, if any: No Using a feeding tube?: No feeding tube removed.  Weight changes, if any:  Wt Readings from Last 3 Encounters:  10/23/22 208 lb 6.4 oz (94.5 kg)  10/23/22 208 lb 4.8 oz (94.5 kg)  10/12/22 204 lb (92.5 kg)    Swallowing issues, if any: No Smoking or chewing tobacco? No Using fluoride trays daily? No Last ENT visit was on: Patient was seen by ENT in October 2023. Other notable issues, if any:   BP 127/67   Pulse (!) 57   Temp 97.8 F (36.6 C)   Ht 6\' 3"  (1.905 m)   Wt 208 lb 6.4 oz (94.5 kg)   SpO2 96%   BMI 26.05 kg/m

## 2022-10-10 ENCOUNTER — Encounter: Payer: Medicare HMO | Admitting: Physical Therapy

## 2022-10-12 ENCOUNTER — Encounter: Payer: Self-pay | Admitting: Adult Health

## 2022-10-12 ENCOUNTER — Ambulatory Visit (INDEPENDENT_AMBULATORY_CARE_PROVIDER_SITE_OTHER): Payer: Medicare HMO | Admitting: Adult Health

## 2022-10-12 VITALS — BP 110/70 | HR 70 | Temp 97.3°F | Ht 75.0 in | Wt 204.0 lb

## 2022-10-12 DIAGNOSIS — J0140 Acute pansinusitis, unspecified: Secondary | ICD-10-CM | POA: Diagnosis not present

## 2022-10-12 MED ORDER — DOXYCYCLINE HYCLATE 100 MG PO CAPS: 100.0000 mg | ORAL_CAPSULE | Freq: Two times a day (BID) | ORAL | 0 refills | Status: DC

## 2022-10-12 MED ORDER — FLUTICASONE PROPIONATE 50 MCG/ACT NA SUSP: 2.0000 | Freq: Every day | NASAL | 6 refills | Status: DC

## 2022-10-12 NOTE — Progress Notes (Signed)
Subjective:    Patient ID: Cory [name], male    DOB: 07-12-1946, 77 y.o.   MRN: [id number]  HPI 76 year old male who  has a past medical history of Aortic stenosis, Arthritis, Cancer (HCC), Dysrhythmia, PAF (paroxysmal atrial fibrillation) ([place]), Peripheral arterial disease ([place]), and Tobacco abuse.  He is a patient of Dr Elease Hashimoto who I am seeing today for an acute issue. He reports hat for the last week he has had a mostly dry cough, nasal congestion, sinus pain pressure,  and fatigue. His symptoms have not been improving throughout the week  He denies fevers, chills, shortness of breath  At home he has been using mucinex and saline spray without improvement.   No sick contacts. Had negative covid test at home.   He is in process of quitting smoking    Review of Systems See HPI   Past Medical History:  Diagnosis Date   Aortic stenosis    a. Moderate by echo 05/2020.   Arthritis    right hip   Cancer ([place])    skin cancer nose, right left, upper left basel cell   Dysrhythmia    a-fib   PAF (paroxysmal atrial fibrillation) (HCC)    Peripheral arterial disease (HCC)    Tobacco abuse     Social History   Socioeconomic History   Marital status: Married    Spouse name: Not on file   Number of children: 5   Years of education: Not on file   Highest education level: Master's degree (e.g., MA, MS, MEng, MEd, MSW, MBA)  Occupational History   Not on file  Tobacco Use   Smoking status: Every Day    Packs/day: 0.50    Years: 50.00    Total pack years: 25.00    Types: Cigarettes    Passive exposure: Never   Smokeless tobacco: Never  Vaping Use   Vaping Use: Never used  Substance and Sexual Activity   Alcohol use: Yes    Alcohol/week: 0.0 standard drinks of alcohol    Comment: 4 to 5 times a week; beer, wine and liquor    Drug use: No   Sexual activity: Not Currently  Other Topics Concern   Not on file  Social History Narrative   Scores reading tests.  Lives  with wife.     Social Determinants of Health   Financial Resource Strain: Low Risk  (05/24/2022)   Overall Financial Resource Strain (CARDIA)    Difficulty of Paying Living Expenses: Not hard at all  Food Insecurity: No Food Insecurity (08/01/2022)   Hunger Vital Sign    Worried About Running Out of Food in the Last Year: Never true    Ran Out of Food in the Last Year: Never true  Transportation Needs: No Transportation Needs (08/01/2022)   PRAPARE - Hydrologist (Medical): No    Lack of Transportation (Non-Medical): No  Physical Activity: Insufficiently Active (01/08/2022)   Exercise Vital Sign    Days of Exercise per Week: 3 days    Minutes of Exercise per Session: 30 min  Stress: No Stress Concern Present (01/08/2022)   [place]    Feeling of Stress : Not at all  Social Connections: Moderately Isolated (01/08/2022)   Social Connection and Isolation Panel [NHANES]    Frequency of Communication with Friends and Family: More than three times a week    Frequency of Social Gatherings with  Friends and Family: Three times a week    Attends Religious Services: Never    Active Member of Clubs or Organizations: No    Attends Archivist Meetings: Not on file    Marital Status: Married  Intimate Partner Violence: Not At Risk (06/07/2021)   Humiliation, Afraid, Rape, and Kick questionnaire    Fear of Current or Ex-Partner: No    Emotionally Abused: No    Physically Abused: No    Sexually Abused: No    Past Surgical History:  Procedure Laterality Date   ABDOMINAL AORTIC ENDOVASCULAR STENT GRAFT N/A 08/29/2020   Procedure: ABDOMINAL AORTIC ENDOVASCULAR STENT GRAFT;  Surgeon: Elam Dutch, MD;  Location: [facility];  Service: Vascular;  Laterality: N/A;   ABDOMINAL AORTOGRAM W/LOWER EXTREMITY Bilateral 06/24/2020   Procedure: ABDOMINAL AORTOGRAM W/LOWER EXTREMITY;  Surgeon: Elam Dutch, MD;  Location: [facility];  Service: Cardiovascular;  Laterality: Bilateral;   BACK SURGERY     2005   BIOPSY  05/21/2022   Procedure: BIOPSY;  Surgeon: Rush Landmark Telford Nab., MD;  Location: [facility];  Service: Gastroenterology;;   CERVICAL SPINE SURGERY     DIRECT LARYNGOSCOPY Bilateral 04/18/2022   Procedure: DIRECT LARYNGOSCOPY WITH BIOPSIES;  Surgeon: Melida Quitter, MD;  Location: [facility];  Service: ENT;  Laterality: Bilateral;   EMBOLECTOMY Right 08/29/2020   Procedure: POPLITEAL AND TIBIAL EMBOLECTOMY;  Surgeon: Elam Dutch, MD;  Location: [facility];  Service: Vascular;  Laterality: Right;   EMBOLIZATION Right 06/24/2020   Procedure: EMBOLIZATION;  Surgeon: Elam Dutch, MD;  Location: [facility];  Service: Cardiovascular;  Laterality: Right;   ENDARTERECTOMY FEMORAL Right 08/29/2020   Procedure: ENDARTERECTOMY FEMORAL;  Surgeon: Elam Dutch, MD;  Location: [facility];  Service: Vascular;  Laterality: Right;   ESOPHAGOGASTRODUODENOSCOPY N/A 06/27/2022   Procedure: ESOPHAGOGASTRODUODENOSCOPY (EGD);  Surgeon: Irving Copas., MD;  Location: [facility];  Service: Gastroenterology;  Laterality: N/A;   ESOPHAGOGASTRODUODENOSCOPY (EGD) WITH PROPOFOL N/A 05/21/2022   Procedure: ESOPHAGOGASTRODUODENOSCOPY (EGD) WITH PROPOFOL;  Surgeon: Rush Landmark Telford Nab., MD;  Location: [facility];  Service: Gastroenterology;  Laterality: N/A;   EUS N/A 05/21/2022   Procedure: UPPER ENDOSCOPIC ULTRASOUND (EUS) RADIAL;  Surgeon: Irving Copas., MD;  Location: [facility];  Service: Gastroenterology;  Laterality: N/A;   EUS N/A 06/27/2022   Procedure: UPPER ENDOSCOPIC ULTRASOUND (EUS) RADIAL;  Surgeon: Irving Copas., MD;  Location: [facility];  Service: Gastroenterology;  Laterality: N/A;   EYE SURGERY     FEMORAL ARTERY EXPLORATION Right 06/25/2020   Procedure: RIGHT GROIN EXPLORATION Evacuation of Hematoma  WITH REPAIR OF RIGHT Common FEMORAL  ARTERY.;  Surgeon: Angelia Mould, MD;  Location: [facility];  Service: Vascular;  Laterality: Right;   FEMORAL-POPLITEAL BYPASS GRAFT Left 11/22/2020   Procedure: LEFT ABOVE KNEE-BELOW KNEE BYPASS TIBIAL PERITONEAL TRUNK REVERSE IPSILATERAL GREATER SAPHENOUS VEIN;  Surgeon: Elam Dutch, MD;  [facility];  Service: Vascular;  Laterality: Left;   FINE NEEDLE ASPIRATION N/A 05/21/2022   Procedure: FINE NEEDLE ASPIRATION (FNA) LINEAR;  Surgeon: Irving Copas., MD;  Location: [facility];  Service: Gastroenterology;  Laterality: N/A;   FINE NEEDLE ASPIRATION  06/27/2022   Procedure: FINE NEEDLE ASPIRATION;  Surgeon: Rush Landmark Telford Nab., MD;  Location: [facility];  Service: Gastroenterology;;   HOT HEMOSTASIS N/A 06/27/2022   Procedure: HOT HEMOSTASIS (ARGON PLASMA COAGULATION/BICAP);  Surgeon: Irving Copas., MD;  Location: [facility];  Service: Gastroenterology;  Laterality: N/A;   IR  GASTROSTOMY TUBE MOD SED  06/12/2022   IR GASTROSTOMY TUBE REMOVAL  09/04/2022   IR IMAGING GUIDED PORT INSERTION  06/12/2022   LEG SURGERY     Trauma   LOWER EXTREMITY ANGIOGRAPHY Left 11/17/2020   Procedure: Lower Extremity Angiography;  Surgeon: Marty Heck, MD;  Location: [place] [facility];  Service: Cardiovascular;  Laterality: Left;   PATCH ANGIOPLASTY Right 08/29/2020   Procedure: PATCH ANGIOPLASTY RIGHT BELOW KNEE POPLITEAL ARTERY AND RIGHT COMMON FEMORAL ARTERY;  Surgeon: Elam Dutch, MD;  Location: [facility];  Service: Vascular;  Laterality: Right;   RETINAL DETACHMENT SURGERY     RIGID ESOPHAGOSCOPY  04/18/2022   Procedure: RIGID ESOPHAGOSCOPY;  Surgeon: Melida Quitter, MD;  Location: [facility];  Service: ENT;;   TONSILLECTOMY  04/18/2022   Procedure: LEFT TONSILLECTOMY;  Surgeon: Melida Quitter, MD;  Location: [facility];  Service: ENT;;   ULTRASOUND GUIDANCE FOR VASCULAR ACCESS Bilateral 08/29/2020   Procedure: ULTRASOUND GUIDANCE FOR VASCULAR ACCESS;  Surgeon: Elam Dutch, MD;  Location: [facility];  Service: Vascular;  Laterality: Bilateral;    Family History  Problem Relation Age of Onset   Ovarian cancer Mother    Alcoholism Father    Breast cancer Sister    Ovarian cancer Sister     Allergies  Allergen Reactions   Zithromax [Azithromycin] Diarrhea   Chlorpheniramine Cough and Itching    Current Outpatient Medications on File Prior to Visit  Medication Sig Dispense Refill   acetaminophen (TYLENOL) 500 MG tablet Take 500 mg by mouth every 6 (six) hours as needed (for pain.).     cetirizine (ZYRTEC) 10 MG tablet Take 1 tablet (10 mg total) by mouth daily. (Patient taking differently: Take 10 mg by mouth daily as needed for allergies.) 30 tablet 11   cholecalciferol (VITAMIN D) 25 MCG (1000 UNIT) tablet Take 1,000 Units by mouth 2 (two) times daily.     lidocaine-prilocaine (EMLA) cream Apply to affected area once 30 g 3   metoprolol tartrate (LOPRESSOR) 25 MG tablet Take 1 tablet (25 mg total) by mouth 2 (two) times daily as needed. 30 tablet 1   nicotine (NICODERM CQ - DOSED IN MG/24 HOURS) 21 mg/24hr patch Place 1 patch (21 mg total) onto the skin daily. Apply 21 mg patch daily x 6 wk, then '14mg'$  patch daily x 2 wk, then 7 mg patch daily x 2 wk 14 patch 2   omeprazole (PRILOSEC) 40 MG capsule Take 1 capsule (40 mg total) by mouth daily. 30 capsule 6   rivaroxaban (XARELTO) 20 MG TABS tablet Take 1 tablet (20 mg total) by mouth daily with supper. 90 tablet 1   rosuvastatin (CRESTOR) 10 MG tablet Take 1 tablet (10 mg total) by mouth daily. 90 tablet 1   sodium chloride (OCEAN) 0.65 % SOLN nasal spray Place 1 spray into both nostrils as needed for congestion.     TIADYLT ER 120 MG 24 hr capsule TAKE 1 CAPSULE EVERY DAY (SCHEDULE OFFICE VISIT FOR FUTURE REFILLS) 90 capsule 3   No current facility-administered medications on file prior to visit.    BP 110/70   Pulse 70   Temp (!) 97.3 F (36.3 C) (Oral)   Ht '6\' 3"'$  (1.905 m)   Wt 204 lb (92.5  kg)   SpO2 95%   BMI 25.50 kg/m       Objective:   Physical Exam Vitals reviewed.  Constitutional:      Appearance: Normal appearance.  HENT:  Nose: Congestion and rhinorrhea present. Rhinorrhea is purulent.     Right Turbinates: Enlarged and swollen.     Left Turbinates: Enlarged and swollen.     Right Sinus: Maxillary sinus tenderness and frontal sinus tenderness present.     Left Sinus: Maxillary sinus tenderness and frontal sinus tenderness present.     Mouth/Throat:     Mouth: Mucous membranes are moist.     Pharynx: Oropharynx is clear. Uvula midline.  Cardiovascular:     Rate and Rhythm: Normal rate and regular rhythm.     Pulses: Normal pulses.     Heart sounds: Normal heart sounds.  Pulmonary:     Effort: Pulmonary effort is normal.     Breath sounds: Normal breath sounds.  Neurological:     General: No focal deficit present.     Mental Status: He is alert and oriented to person, place, and time.  Psychiatric:        Mood and Affect: Mood normal.        Thought Content: Thought content normal.        Judgment: Judgment normal.       Assessment & Plan:  1. Acute non-recurrent pansinusitis - Will treat due to symptoms  - Follow up if not resolved by the end of abx therapy or sooner if symptoms do not improve  - doxycycline (VIBRAMYCIN) 100 MG capsule; Take 1 capsule (100 mg total) by mouth 2 (two) times daily.  Dispense: 14 capsule; Refill: 0 - fluticasone (FLONASE) 50 MCG/ACT nasal spray; Place 2 sprays into both nostrils daily.  Dispense: 16 g; Refill: 6  Dorothyann Peng, NP

## 2022-10-15 ENCOUNTER — Ambulatory Visit: Payer: Medicare HMO | Admitting: Physical Therapy

## 2022-10-17 ENCOUNTER — Encounter: Payer: Self-pay | Admitting: Physical Therapy

## 2022-10-17 ENCOUNTER — Ambulatory Visit: Payer: Medicare HMO | Attending: Neurology | Admitting: Physical Therapy

## 2022-10-17 ENCOUNTER — Other Ambulatory Visit: Payer: Self-pay | Admitting: Cardiology

## 2022-10-17 DIAGNOSIS — C321 Malignant neoplasm of supraglottis: Secondary | ICD-10-CM | POA: Diagnosis not present

## 2022-10-17 DIAGNOSIS — R1310 Dysphagia, unspecified: Secondary | ICD-10-CM | POA: Diagnosis not present

## 2022-10-17 DIAGNOSIS — R293 Abnormal posture: Secondary | ICD-10-CM | POA: Diagnosis not present

## 2022-10-17 DIAGNOSIS — I4891 Unspecified atrial fibrillation: Secondary | ICD-10-CM

## 2022-10-17 DIAGNOSIS — I890 Lymphedema, not elsewhere classified: Secondary | ICD-10-CM | POA: Diagnosis not present

## 2022-10-17 NOTE — Therapy (Signed)
OUTPATIENT PHYSICAL THERAPY ONCOLOGY TREATMENT  Patient Name: Cory [name] MRN: [id number] DOB:[birth year], 77 y.o., male Today's Date: 10/17/2022  END OF SESSION:  PT End of Session - 10/17/22 1005     Visit Number 5    Number of Visits 9    Date for PT Re-Evaluation 11/14/22    PT Start Time 1004    PT Stop Time 1046    PT Time Calculation (min) 42 min    Activity Tolerance Patient tolerated treatment well    Behavior During Therapy WFL for tasks assessed/performed             Past Medical History:  Diagnosis Date   Aortic stenosis    a. Moderate by echo 05/2020.   Arthritis    right hip   Cancer ([place])    skin cancer nose, right left, upper left basel cell   Dysrhythmia    a-fib   PAF (paroxysmal atrial fibrillation) (HCC)    Peripheral arterial disease (HCC)    Tobacco abuse    Past Surgical History:  Procedure Laterality Date   ABDOMINAL AORTIC ENDOVASCULAR STENT GRAFT N/A 08/29/2020   Procedure: ABDOMINAL AORTIC ENDOVASCULAR STENT GRAFT;  Surgeon: Elam Dutch, MD;  Location: [place];  Service: Vascular;  Laterality: N/A;   ABDOMINAL AORTOGRAM W/LOWER EXTREMITY Bilateral 06/24/2020   Procedure: ABDOMINAL AORTOGRAM W/LOWER EXTREMITY;  Surgeon: Elam Dutch, MD;  Location: [place] [facility];  Service: Cardiovascular;  Laterality: Bilateral;   BACK SURGERY     2005   BIOPSY  05/21/2022   Procedure: BIOPSY;  Surgeon: Rush Landmark Telford Nab., MD;  Location: [facility];  Service: Gastroenterology;;   CERVICAL SPINE SURGERY     DIRECT LARYNGOSCOPY Bilateral 04/18/2022   Procedure: DIRECT LARYNGOSCOPY WITH BIOPSIES;  Surgeon: Melida Quitter, MD;  Location: [place];  Service: ENT;  Laterality: Bilateral;   EMBOLECTOMY Right 08/29/2020   Procedure: POPLITEAL AND TIBIAL EMBOLECTOMY;  Surgeon: Elam Dutch, MD;  Location: [place];  Service: Vascular;  Laterality: Right;   EMBOLIZATION Right 06/24/2020   Procedure: EMBOLIZATION;  Surgeon: Elam Dutch, MD;   Location: [place] [facility];  Service: Cardiovascular;  Laterality: Right;   ENDARTERECTOMY FEMORAL Right 08/29/2020   Procedure: ENDARTERECTOMY FEMORAL;  Surgeon: Elam Dutch, MD;  Location: [facility];  Service: Vascular;  Laterality: Right;   ESOPHAGOGASTRODUODENOSCOPY N/A 06/27/2022   Procedure: ESOPHAGOGASTRODUODENOSCOPY (EGD);  Surgeon: Irving Copas., MD;  Location: [facility];  Service: Gastroenterology;  Laterality: N/A;   ESOPHAGOGASTRODUODENOSCOPY (EGD) WITH PROPOFOL N/A 05/21/2022   Procedure: ESOPHAGOGASTRODUODENOSCOPY (EGD) WITH PROPOFOL;  Surgeon: Rush Landmark Telford Nab., MD;  Location: [facility];  Service: Gastroenterology;  Laterality: N/A;   EUS N/A 05/21/2022   Procedure: UPPER ENDOSCOPIC ULTRASOUND (EUS) RADIAL;  Surgeon: Irving Copas., MD;  Location: [facility];  Service: Gastroenterology;  Laterality: N/A;   EUS N/A 06/27/2022   Procedure: UPPER ENDOSCOPIC ULTRASOUND (EUS) RADIAL;  Surgeon: Irving Copas., MD;  Location: [facility];  Service: Gastroenterology;  Laterality: N/A;   EYE SURGERY     FEMORAL ARTERY EXPLORATION Right 06/25/2020   Procedure: RIGHT GROIN EXPLORATION Evacuation of Hematoma  WITH REPAIR OF RIGHT Common FEMORAL ARTERY.;  Surgeon: Angelia Mould, MD;  Location: [place];  Service: Vascular;  Laterality: Right;   FEMORAL-POPLITEAL BYPASS GRAFT Left 11/22/2020   Procedure: LEFT ABOVE KNEE-BELOW KNEE BYPASS TIBIAL PERITONEAL TRUNK REVERSE IPSILATERAL GREATER SAPHENOUS VEIN;  Surgeon: Elam Dutch, MD;  Location: [place];  Service: Vascular;  Laterality:  Left;   FINE NEEDLE ASPIRATION N/A 05/21/2022   Procedure: FINE NEEDLE ASPIRATION (FNA) LINEAR;  Surgeon: Irving Copas., MD;  [facility]: [facility];  Service: Gastroenterology;  Laterality: N/A;   FINE NEEDLE ASPIRATION  06/27/2022   Procedure: FINE NEEDLE ASPIRATION;  Surgeon: Rush Landmark Telford Nab., MD;  [facility];  Service:  Gastroenterology;;   HOT HEMOSTASIS N/A 06/27/2022   Procedure: HOT HEMOSTASIS (ARGON PLASMA COAGULATION/BICAP);  Surgeon: Irving Copas., MD;  Location: [facility];  Service: Gastroenterology;  Laterality: N/A;   IR GASTROSTOMY TUBE MOD SED  06/12/2022   IR GASTROSTOMY TUBE REMOVAL  09/04/2022   IR IMAGING GUIDED PORT INSERTION  06/12/2022   LEG SURGERY     Trauma   LOWER EXTREMITY ANGIOGRAPHY Left 11/17/2020   Procedure: Lower Extremity Angiography;  Surgeon: Marty Heck, MD;  Location: [place] [facility];  Service: Cardiovascular;  Laterality: Left;   PATCH ANGIOPLASTY Right 08/29/2020   Procedure: PATCH ANGIOPLASTY RIGHT BELOW KNEE POPLITEAL ARTERY AND RIGHT COMMON FEMORAL ARTERY;  Surgeon: Elam Dutch, MD;  Location[facility];  Service: Vascular;  Laterality: Right;   RETINAL DETACHMENT SURGERY     RIGID ESOPHAGOSCOPY  04/18/2022   Procedure: RIGID ESOPHAGOSCOPY;  Surgeon: Melida Quitter, MD;  Location: [place];  Service: ENT;;   TONSILLECTOMY  04/18/2022   Procedure: LEFT TONSILLECTOMY;  Surgeon: Melida Quitter, MD;  Location: [place];  Service: ENT;;   ULTRASOUND GUIDANCE FOR VASCULAR ACCESS Bilateral 08/29/2020   Procedure: ULTRASOUND GUIDANCE FOR VASCULAR ACCESS;  Surgeon: Elam Dutch, MD;  Location: [place];  Service: Vascular;  Laterality: Bilateral;   Patient Active Problem List   Diagnosis Date Noted   Mucositis due to chemotherapy 07/17/2022   Port-A-Cath in place 06/21/2022   Malignant neoplasm of supraglottis ([place]) 05/08/2022   Cancer of supraglottis ([place]) 05/04/2022   Rib pain on left side 12/01/2021   Chronic sore throat 08/15/2021   Dyslipidemia 07/17/2021   AAA (abdominal aortic aneurysm) ([place]) 08/29/2020   PAD (peripheral artery disease) ([place]) 06/25/2020   Educated about COVID-19 virus infection 05/12/2020   Tobacco abuse 05/12/2020   Peripheral vascular disease ([place]) 04/25/2020   Abdominal aortic aneurysm (AAA) ([place]) 04/25/2020   Essential  hypertension 05/16/2018   Medication management 05/16/2018   Piriformis syndrome of right side 05/06/2018   Allergy 10/29/2017   Recurrent sinusitis 10/29/2017   Atrial fibrillation, transient ([place]) 06/07/2015   Aortic stenosis    ABNORMAL ELECTROCARDIOGRAM 07/14/2009    PCP: Carolann Littler, MD  REFERRING PROVIDER: [name], MD  REFERRING DIAG: C32.1 (ICD-10-CM) - Malignant neoplasm of supraglottis ([place])   THERAPY DIAG:  Lymphedema, not elsewhere classified  Abnormal posture  Malignant neoplasm of supraglottis ([place])  ONSET DATE: 11/08/21  Rationale for Evaluation and Treatment: Rehabilitation  SUBJECTIVE:  SUBJECTIVE STATEMENT: Nothing new. Denies questions on self MLD and reports it is going well.   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 He presented to Dr. Redmond Baseman on 08/15/21 with c/o persistent left sided sore throat since August 2022. Laryngoscopy completed at this time was unremarkable. 11/08/21 CT neck revealed a sub-centimeter focus of mucosal hyperenhancement along the posterior aspect of the uvula, and a few tiny cystic appearing foci along the left glosso-tonsillar sulcus. CT also showed a nonspecific mildly enlarged right level 2 lymph node measuring 14 mm in the short axis, and multiple small calcific foci within the left palatine tonsil possibly reflective of postinflammatory calcifications and/or tonsilloliths. 11/21/21 repeat Laryngoscopy by Dr. Redmond Baseman again was unremarkable. Due to persistent throat pain Dr. Redmond Baseman advised proceeding with a direct larynscopy and rigid esophagosopy with biopsies.  04/18/22 Left laryngeal biopsies showed invasive moderately differentiated SCC. 05/07/22 PET completed showing Asymmetric Hypermetabolism in  the left oropharynx and involving the left epiglottis. Imaging features compatible with primary neoplasm. (I believe some of the oropharynx SUV uptake could be r/t abscess, recent biopsy), Contralateral (right-sided) hypermetabolic level II cervical lymph nodes compatible with metastatic disease. No hypermetabolic lymphadenopathy in the left neck, and 1.2 cm short axis aortocaval lymph node in the upper abdomen is hypermetabolic. Metastatic disease is a concern. This lymph node has increased from 0.7 cm on a study from 2022. 05/21/22 Biopsy of the lymph node in his abdomen with CT simulation planned for 05/23/22. Treatment plan will depend on results of the biopsy on 10/16 but will most likely be weekly chemo and 35 fractions of radiation. He will receive 35 fractions of radiation and weekly chemotherapy to his larynx and bilateral neck.  He is scheduled to start on 05/30/22 and complete 07/19/22  PAIN:  Are you having pain? No  PRECAUTIONS: Other: neck lymphedema  WEIGHT BEARING RESTRICTIONS: No  FALLS:  Has patient fallen in last 6 months? 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  LEISURE: walks 20-30 min/day  HAND DOMINANCE: right   PRIOR LEVEL OF FUNCTION: Independent  PATIENT GOALS: to get rid of the swelling   OBJECTIVE:  COGNITION: Overall cognitive status: Within functional limits for tasks assessed   PALPATION: Fullness palpable at anterior neck  OBSERVATIONS / OTHER ASSESSMENTS: fullness at anterior neck  POSTURE: forward head/rounded shoulders   CERVICAL AROM: All within normal limits:    Percent limited  Flexion WFL  Extension WFL  Right lateral flexion WFL  Left lateral flexion WFL  Right rotation [facility]   Left rotation [facility]    LYMPHEDEMA ASSESSMENT:      Circumference in cm 08/03/23 09/19/22 09/27/22 10/01/22 10/17/22  4 cm superior to sternal notch around neck 39.6 43.6 43.9 43.5 44.2  6 cm superior to sternal notch around neck 40.9 44.5 44.5 45 44.2  8 cm superior to sternal notch around neck 41 46 45.1 45.2 45  R lateral nostril from base of nose to medial tragus        L lateral nostril from base of nose to medial tragus        R corner of mouth to where ear lobe meets face        L corner of mouth to where ear lobe  meets face                          (Blank rows=not tested)   CURRENT/PAST TREATMENTS:   Surgery type/date: Several biopsies in Oct 2023   Chemotherapy: Completed 07/19/22   Radiation: Completed 07/19/22  LLIS: 4   TODAY'S TREATMENT:                                                                                                                                         DATE:   10/17/22- short neck, 5 diaphragmatic breaths, bilateral axillary nodes, bilateral pectoral nodes, anterior chest, short neck, posterior neck moving fluid towards pathway aimed at lateral neck, then lateral and anterior neck moving fluid towards pathway aimed at lateral neck then retracing all steps. Measured circumferences.   10/01/22- short neck, 5 diaphragmatic breaths, bilateral axillary nodes, bilateral pectoral nodes, anterior chest, short neck, posterior neck moving fluid towards pathway aimed at lateral neck, then lateral and anterior neck moving fluid towards pathway aimed at lateral neck then retracing all steps. Measured circumferences per pt request but with no significant changes.   09/27/22- had pt demonstrate entire sequence today while therapist provided v/c and t/c cues as need: short neck, 5 diaphragmatic breaths, bilateral axillary nodes, bilateral pectoral nodes, anterior chest, short neck, posterior neck moving fluid towards pathway aimed at lateral neck, then lateral and anterior  neck moving fluid towards pathway aimed at lateral neck then retracing all steps. Pt require min to mod cueing for correct circle motion including the working phase and resting phase but pt was able to demonstrate correct seuqence by following the instructions  09/24/22- Instructed pt using [facility] anterior approach handout as follows: short neck, 5 diaphragmatic breaths, bilateral axillary nodes, bilateral pectoral nodes, anterior chest, short neck, posterior neck moving fluid towards pathway aimed at lateral neck, then lateral and anterior neck moving fluid towards pathway aimed at lateral neck then retracing all steps. Walked pt through each step in the instructions and had him return demonstrate each step with v/c and t/c while returning therapist demo. Pt demonstrated good pressure and good skin stretch with only occasional cues to not do a complete circle and to use pressure only during first half of circle. Issued instructions for pt to practice at home.   09/19/22- Instructed pt using [facility] anterior approach handout as follows: short neck, 5 diaphragmatic breaths, bilateral axillary nodes, bilateral pectoral nodes, anterior chest, short neck, posterior neck moving fluid towards pathway aimed at lateral neck, then lateral and anterior neck moving fluid towards pathway aimed at lateral neck then retracing all steps  while educating pt in anatomy and physiology of the lymphatic system and basic principles of MLD Showed pt the tribute head and neck garment and pt was interested and requesting his demographics be sent to [facility] to check for insurance coverage Made chip pack  for pt to wear to help reduce edema  PATIENT EDUCATION:  Education details: anatomy and physiology of the lymphatic system, basic principles of MLD, compression garment/chip pack Person educated: Patient Education method: Customer service manager Education comprehension: verbalized understanding  HOME EXERCISE PROGRAM: Wear  chip pack as much as possible  ASSESSMENT:  CLINICAL IMPRESSION: Pt demonstrated excellent reduction in edema by end of session today. He had to cancel his appointments last week and reports he did not do much MLD due to being sick. Assessed his progress towards goals and he has met his goal for independence with MLD and pt reports when he does self MLD he can tell a difference. His insurance was not in network with the DME company so he has given info to obtain himself and plans on doing so. This will help to further manage his lymphedema. His circumferences had not decreased much but most likely due to his recent illness. He would benefit from continued skilled PT services at 1x/wk until he receives his garment and therapist can assess it for fit.   OBJECTIVE IMPAIRMENTS: decreased knowledge of condition, decreased knowledge of use of DME, increased edema, and postural dysfunction.   ACTIVITY LIMITATIONS:  none  PARTICIPATION LIMITATIONS:  none  PERSONAL FACTORS:  none  are also affecting patient's functional outcome.   REHAB POTENTIAL: Good  CLINICAL DECISION MAKING: Stable/uncomplicated  EVALUATION COMPLEXITY: Low  GOALS: Goals reviewed with patient? Yes  SHORT TERM GOALS=LONG TERM GOALS Target date: 09/18/22  Pt will be independent in self MLD for long term management of lymphedema.  Baseline: Goal status: MET 10/17/22 pt feels independent with this  2.  Pt will obtain appropriate compression garments for management of neck lymphedema.  Baseline:  Goal status: IN PROGRESS 10/17/22- pt was issued info for obtaining this online  3.  Pt will demonstrate a 2 cm decrease at 8 cm superior to sternal notch to decrease risk of infection.  Baseline:  Goal status: IN PROGRESS 10/17/22 pt reports he was sick last week and did not do much so he has decreased 0.2 cm  PLAN:  PT FREQUENCY: 1x/week  PT DURATION: 4 weeks  PLANNED INTERVENTIONS: Therapeutic exercises, Therapeutic activity,  Patient/Family education, Self Care, Orthotic/Fit training, Manual lymph drainage, Compression bandaging, Taping, Vasopneumatic device, Manual therapy, and Re-evaluation  PLAN FOR NEXT SESSION: MLD neck, assess garment for fit and d/c if fit is good?   [facility] [place], PT 10/17/2022, 10:55 AM

## 2022-10-18 NOTE — Telephone Encounter (Signed)
Prescription refill request for Xarelto received.  Indication: AF Last office visit: 07/12/22  Vita Barley MD Weight: 97kg Age: 77 Scr: 0.80 on 07/31/22  Epic CrCl: 104.41  Based on above findings Xarelto '20mg'$  daily is the appropriate dose.  Refill approved.

## 2022-10-22 ENCOUNTER — Encounter (HOSPITAL_COMMUNITY)
Admission: RE | Admit: 2022-10-22 | Discharge: 2022-10-22 | Disposition: A | Discharge status: Home / Self Care | Payer: Medicare HMO | Source: Ambulatory Visit | Attending: Radiation Oncology | Admitting: Radiation Oncology

## 2022-10-22 ENCOUNTER — Other Ambulatory Visit: Payer: Medicare HMO

## 2022-10-22 ENCOUNTER — Ambulatory Visit: Payer: Medicare HMO | Admitting: Hematology and Oncology

## 2022-10-22 DIAGNOSIS — C321 Malignant neoplasm of supraglottis: Secondary | ICD-10-CM | POA: Insufficient documentation

## 2022-10-22 DIAGNOSIS — R911 Solitary pulmonary nodule: Secondary | ICD-10-CM | POA: Diagnosis not present

## 2022-10-22 LAB — GLUCOSE, CAPILLARY: Glucose-Capillary: 103 mg/dL — ABNORMAL HIGH (ref 70–99)

## 2022-10-22 MED ORDER — FLUDEOXYGLUCOSE F - 18 (FDG) INJECTION
10.2000 | Freq: Once | INTRAVENOUS | Status: AC
  Administered 2022-10-22: 10.2 via INTRAVENOUS

## 2022-10-23 ENCOUNTER — Other Ambulatory Visit: Payer: Self-pay

## 2022-10-23 ENCOUNTER — Ambulatory Visit
Admission: RE | Admit: 2022-10-23 | Discharge: 2022-10-23 | Disposition: A | Discharge status: Home / Self Care | Payer: Medicare HMO | Source: Ambulatory Visit | Attending: Radiation Oncology | Admitting: Radiation Oncology

## 2022-10-23 ENCOUNTER — Inpatient Hospital Stay (HOSPITAL_BASED_OUTPATIENT_CLINIC_OR_DEPARTMENT_OTHER): Payer: Medicare HMO | Admitting: Hematology and Oncology

## 2022-10-23 ENCOUNTER — Inpatient Hospital Stay: Payer: Medicare HMO | Attending: Radiation Oncology

## 2022-10-23 ENCOUNTER — Encounter: Payer: Self-pay | Admitting: Radiation Oncology

## 2022-10-23 ENCOUNTER — Telehealth: Payer: Self-pay

## 2022-10-23 VITALS — BP 127/67 | HR 57 | Temp 97.8°F | Ht 75.0 in | Wt 208.4 lb

## 2022-10-23 VITALS — BP 127/67 | HR 57 | Temp 97.8°F | Resp 16 | Ht 75.0 in | Wt 208.3 lb

## 2022-10-23 DIAGNOSIS — Z79899 Other long term (current) drug therapy: Secondary | ICD-10-CM | POA: Insufficient documentation

## 2022-10-23 DIAGNOSIS — C321 Malignant neoplasm of supraglottis: Secondary | ICD-10-CM

## 2022-10-23 DIAGNOSIS — Z7901 Long term (current) use of anticoagulants: Secondary | ICD-10-CM | POA: Insufficient documentation

## 2022-10-23 DIAGNOSIS — J432 Centrilobular emphysema: Secondary | ICD-10-CM | POA: Insufficient documentation

## 2022-10-23 DIAGNOSIS — I700 Atherosclerosis of aorta: Secondary | ICD-10-CM | POA: Insufficient documentation

## 2022-10-23 DIAGNOSIS — D479 Neoplasm of uncertain behavior of lymphoid, hematopoietic and related tissue, unspecified: Secondary | ICD-10-CM | POA: Diagnosis not present

## 2022-10-23 DIAGNOSIS — Z95828 Presence of other vascular implants and grafts: Secondary | ICD-10-CM

## 2022-10-23 DIAGNOSIS — Z452 Encounter for adjustment and management of vascular access device: Secondary | ICD-10-CM | POA: Insufficient documentation

## 2022-10-23 DIAGNOSIS — R911 Solitary pulmonary nodule: Secondary | ICD-10-CM | POA: Diagnosis not present

## 2022-10-23 LAB — CBC WITH DIFFERENTIAL/PLATELET
Abs Immature Granulocytes: 0.02 10*3/uL (ref 0.00–0.07)
Basophils Absolute: 0 10*3/uL (ref 0.0–0.1)
Basophils Relative: 1 %
Eosinophils Absolute: 0.2 10*3/uL (ref 0.0–0.5)
Eosinophils Relative: 4 %
HCT: 41.2 % (ref 39.0–52.0)
Hemoglobin: 14.6 g/dL (ref 13.0–17.0)
Immature Granulocytes: 0 %
Lymphocytes Relative: 17 %
Lymphs Abs: 0.9 10*3/uL (ref 0.7–4.0)
MCH: 34.6 pg — ABNORMAL HIGH (ref 26.0–34.0)
MCHC: 35.4 g/dL (ref 30.0–36.0)
MCV: 97.6 fL (ref 80.0–100.0)
Monocytes Absolute: 0.6 10*3/uL (ref 0.1–1.0)
Monocytes Relative: 11 %
Neutro Abs: 3.5 10*3/uL (ref 1.7–7.7)
Neutrophils Relative %: 67 %
Platelets: 189 10*3/uL (ref 150–400)
RBC: 4.22 MIL/uL (ref 4.22–5.81)
RDW: 12 % (ref 11.5–15.5)
WBC: 5.3 10*3/uL (ref 4.0–10.5)
nRBC: 0 % (ref 0.0–0.2)

## 2022-10-23 LAB — COMPREHENSIVE METABOLIC PANEL
ALT: 13 U/L (ref 0–44)
AST: 14 U/L — ABNORMAL LOW (ref 15–41)
Albumin: 3.6 g/dL (ref 3.5–5.0)
Alkaline Phosphatase: 40 U/L (ref 38–126)
Anion gap: 6 (ref 5–15)
BUN: 18 mg/dL (ref 8–23)
CO2: 30 mmol/L (ref 22–32)
Calcium: 9.1 mg/dL (ref 8.9–10.3)
Chloride: 104 mmol/L (ref 98–111)
Creatinine, Ser: 0.83 mg/dL (ref 0.61–1.24)
GFR, Estimated: >60 mL/min (ref >60)
Glucose, Bld: 111 mg/dL — ABNORMAL HIGH (ref 70–99)
Potassium: 3.8 mmol/L (ref 3.5–5.1)
Sodium: 140 mmol/L (ref 135–145)
Total Bilirubin: 0.3 mg/dL (ref 0.3–1.2)
Total Protein: 6 g/dL — ABNORMAL LOW (ref 6.5–8.1)

## 2022-10-23 MED ORDER — SODIUM CHLORIDE 0.9% FLUSH
10.0000 mL | Freq: Once | INTRAVENOUS | Status: AC
  Administered 2022-10-23: 10 mL

## 2022-10-23 MED ORDER — HEPARIN SOD (PORK) LOCK FLUSH 100 UNIT/ML IV SOLN
500.0000 [IU] | Freq: Once | INTRAVENOUS | Status: AC
  Administered 2022-10-23: 500 [IU]

## 2022-10-23 NOTE — Assessment & Plan Note (Signed)
This is a very pleasant 77 year old male patient with history of smoking now 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   Further investigation of the aortocaval lymph node showed atypical lymphocytes suggestive of lymphoproliferative disorder.  However upon further review, it appears that the lymph node that was biopsied is not the lymph node that was desired to be biopsied.although this was abnormal.  We have requested a second biopsy for better characterization of the diagnosis. This was inconclusive.  He had a PET scan yesterday which showed no evidence of active SCC supraglottis. There is a LUL nodule measuring about 7 mm which was recommended to be followed up in 3 months with repeat imaging. With regards to aortocaval LN, there is no change in hypermetabolic activity. At this time, he also remains clinically asymptomatic from lymphoproliferative disorder stand point. We have hence recommended that he come back for FU in 3 months. CT chest ordered to follow up on LUL nodule.  Benay Pike MD

## 2022-10-23 NOTE — Telephone Encounter (Signed)
-----   Message from [name], MD sent at 10/23/2022  4:04 PM EDT ----- Regarding: FW: Patient is in remission Will you make sure this patient gets in within 3-4 months for TSH?  Thanks. ----- Message ----- From: [name], MD Sent: 10/23/2022   3:33 PM EDT To: Ernst Spell, RN; [name], MD; # Subject: Patient is in remission                        Hi, Cory [name], and [name]- 1) Cory [name], pt noted that you had mentioned you would re-scan his abdomen to recheck the LN for stability. If you scan his abdomen in 3-6 mo, can you scan his chest, too, given the small subpleural node?  1a) Do you want to remove his PAC now?  2) [name], please refer back to ENT Redmond Baseman) in 17mo to reestablish care.  3) Please also call Dr. Erick Blinks nurse to recommend yearly screening TSH given prior H+N radiation. Unless Dr. Elease Hashimoto replies to all and says he is handling!  Thanks! Judson Roch

## 2022-10-23 NOTE — Progress Notes (Signed)
Oncology Nurse Navigator Documentation   I met with [name] before his post treatment follow up with Dr. Isidore Moos today. He is doing well and recovering from his head and neck cancer treatment. He knows to call me if he has any needs or questions in the future.   [name] RN, BSN, OCN Head & Neck Oncology Nurse [place] [facility] Phone # [phone]  Fax # [phone]

## 2022-10-23 NOTE — Progress Notes (Signed)
[place] NOTE  Patient Care Team: [name], MD as PCP - General [name], MD as PCP - Cardiology (Cardiology) Viona Gilmore, [facility] (Inactive) as Pharmacist (Pharmacist) [place], Stephani Police, RN as Oncology Nurse Navigator [name], MD as Consulting Physician (Radiation Oncology) Benay Pike, MD as Consulting Physician (Hematology and Oncology)  CHIEF COMPLAINTS/PURPOSE OF CONSULTATION:  SCC supraglottis  ONCOLOGIC HISTORY: Initially presented to ENT with chronic left-sided sore throat for almost a year.  Initial evaluation did not show any possible causes for sore throat.  He was recommended PPI twice daily but went back to ENT with persistent sore throat he once again had a repeat fiberoptic exam which was stable.   CT neck done on November 08, 2021 did show subcentimeter focus of mucosal hyperenhancement along the posterior aspect of the uvula  laryngoscopy with direct visualization recommended to exclude a mucosal lesion at the site.  Additionally few tiny cystic-appearing foci along the left glossotonsillar sulcus.  Direct visualization recommended.  Nonspecific mildly enlarged right level 2 lymph node.  Multiple small calcific foci in the left palatine tonsil.Repeat fiberoptic exam was unremarkable hence Dr. 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  Tongue biopsy showed benign squamous mucosa with low to intermediate grade dysplasia. 05/07/2022: PET/CT showed asymmetric hypermetabolism in the left oropharynx and involving the left epiglottis.  Contralateral hypermetabolic level 2 cervical lymph nodes compatible with metastatic disease.  No hypermetabolic adenopathy in the left neck or in the chest.  1.2 cm short axis aortocaval lymph node in the upper abdomen is  hypermetabolic metastatic disease is a concern.  This lymph node has increased from 0.7 cm on the study from 2022.  No other metastatic disease. Received radiation from 05/30/2022-07/18/2022 and weekly cisplatin from 06/01/2022-07/18/2022.   INTERIM HISTORY:  Cory [name] returns for a follow up after completion of chemoradiation on 07/18/2022.   He is here for a follow up. He is able to swallow better. He had sinus infection for almost 2 weeks, didn't feel well, had to take abx. He had PET scan yesterday. He may have lost some weight given the UTI Rest of the pertinent 10 point ROS reviewed and neg.  Wt Readings from Last 3 Encounters:  10/23/22 208 lb 4.8 oz (94.5 kg)  10/12/22 204 lb (92.5 kg)  09/12/22 208 lb 4.8 oz (94.5 kg)    MEDICAL HISTORY:  Past Medical History:  Diagnosis Date   Aortic stenosis    a. Moderate by echo 05/2020.   Arthritis    right hip   Cancer ([place])    skin cancer nose, right left, upper left basel cell   Dysrhythmia    a-fib   PAF (paroxysmal atrial fibrillation) (HCC)    Peripheral arterial disease (HCC)    Tobacco abuse     SURGICAL HISTORY: Past Surgical History:  Procedure Laterality Date   ABDOMINAL AORTIC ENDOVASCULAR STENT GRAFT N/A 08/29/2020   Procedure: ABDOMINAL AORTIC ENDOVASCULAR STENT GRAFT;  Surgeon: Elam Dutch, MD;  Location: [place];  Service: Vascular;  Laterality: N/A;   ABDOMINAL AORTOGRAM W/LOWER EXTREMITY Bilateral 06/24/2020   Procedure: ABDOMINAL AORTOGRAM W/LOWER EXTREMITY;  Surgeon: Elam Dutch, MD;  Location: [place] [facility];  Service: Cardiovascular;  Laterality: Bilateral;   BACK SURGERY     2005   BIOPSY  05/21/2022   Procedure: BIOPSY;  Surgeon: Irving Copas., MD;  Location: [facility];  Service: Gastroenterology;;   CERVICAL SPINE SURGERY     DIRECT LARYNGOSCOPY Bilateral 04/18/2022   Procedure: DIRECT LARYNGOSCOPY WITH BIOPSIES;  Surgeon: Melida Quitter, MD;  Location: [place];  Service:  ENT;  Laterality: Bilateral;   EMBOLECTOMY Right 08/29/2020   Procedure: POPLITEAL AND TIBIAL EMBOLECTOMY;  Surgeon: Elam Dutch, MD;  Location: [place];  Service: Vascular;  Laterality: Right;   EMBOLIZATION Right 06/24/2020   Procedure: EMBOLIZATION;  Surgeon: Elam Dutch, MD;  Location: [place] [facility];  Service: Cardiovascular;  Laterality: Right;   ENDARTERECTOMY FEMORAL Right 08/29/2020   Procedure: ENDARTERECTOMY FEMORAL;  Surgeon: Elam Dutch, MD;  Location: [facility] OR;  Service: Vascular;  Laterality: Right;   ESOPHAGOGASTRODUODENOSCOPY N/A 06/27/2022   Procedure: ESOPHAGOGASTRODUODENOSCOPY (EGD);  Surgeon: Irving Copas., MD;  Location: [facility];  Service: Gastroenterology;  Laterality: N/A;   ESOPHAGOGASTRODUODENOSCOPY (EGD) WITH PROPOFOL N/A 05/21/2022   Procedure: ESOPHAGOGASTRODUODENOSCOPY (EGD) WITH PROPOFOL;  Surgeon: Rush Landmark Telford Nab., MD;  Location: [facility];  Service: Gastroenterology;  Laterality: N/A;   EUS N/A 05/21/2022   Procedure: UPPER ENDOSCOPIC ULTRASOUND (EUS) RADIAL;  Surgeon: Irving Copas., MD;  Location: [facility];  Service: Gastroenterology;  Laterality: N/A;   EUS N/A 06/27/2022   Procedure: UPPER ENDOSCOPIC ULTRASOUND (EUS) RADIAL;  Surgeon: Irving Copas., MD;  Location: [facility];  Service: Gastroenterology;  Laterality: N/A;   EYE SURGERY     FEMORAL ARTERY EXPLORATION Right 06/25/2020   Procedure: RIGHT GROIN EXPLORATION Evacuation of Hematoma  WITH REPAIR OF RIGHT Common FEMORAL ARTERY.;  Surgeon: Angelia Mould, MD;  Location: [place];  Service: Vascular;  Laterality: Right;   FEMORAL-POPLITEAL BYPASS GRAFT Left 11/22/2020   Procedure: LEFT ABOVE KNEE-BELOW KNEE BYPASS TIBIAL PERITONEAL TRUNK REVERSE IPSILATERAL GREATER SAPHENOUS VEIN;  Surgeon: Elam Dutch, MD;  Location: [facility] OR;  Service: Vascular;  Laterality: Left;   FINE NEEDLE ASPIRATION N/A 05/21/2022   Procedure: FINE NEEDLE  ASPIRATION (FNA) LINEAR;  Surgeon: Irving Copas., MD;  Location: [facility];  Service: Gastroenterology;  Laterality: N/A;   FINE NEEDLE ASPIRATION  06/27/2022   Procedure: FINE NEEDLE ASPIRATION;  Surgeon: Rush Landmark Telford Nab., MD;  Location: [facility];  Service: Gastroenterology;;   HOT HEMOSTASIS N/A 06/27/2022   Procedure: HOT HEMOSTASIS (ARGON PLASMA COAGULATION/BICAP);  Surgeon: Irving Copas., MD;  Location: [facility];  Service: Gastroenterology;  Laterality: N/A;   IR GASTROSTOMY TUBE MOD SED  06/12/2022   IR GASTROSTOMY TUBE REMOVAL  09/04/2022   IR IMAGING GUIDED PORT INSERTION  06/12/2022   LEG SURGERY     Trauma   LOWER EXTREMITY ANGIOGRAPHY Left 11/17/2020   Procedure: Lower Extremity Angiography;  Surgeon: Marty Heck, MD;  Location: [place] [facility];  Service: Cardiovascular;  Laterality: Left;   PATCH ANGIOPLASTY Right 08/29/2020   Procedure: PATCH ANGIOPLASTY RIGHT BELOW KNEE POPLITEAL ARTERY AND RIGHT COMMON FEMORAL ARTERY;  Surgeon: Elam Dutch, MD;  Location: [place];  Service: Vascular;  Laterality: Right;   RETINAL DETACHMENT SURGERY     RIGID ESOPHAGOSCOPY  04/18/2022   Procedure: RIGID ESOPHAGOSCOPY;  Surgeon: Melida Quitter, MD;  Location: [place];  Service: ENT;;   TONSILLECTOMY  04/18/2022   Procedure: LEFT TONSILLECTOMY;  Surgeon: Melida Quitter, MD;  Location: [place];  Service: ENT;;   ULTRASOUND GUIDANCE FOR VASCULAR ACCESS Bilateral 08/29/2020   Procedure: ULTRASOUND GUIDANCE FOR VASCULAR ACCESS;  Surgeon: Elam Dutch, MD;  Location: [place];  Service: Vascular;  Laterality: Bilateral;    SOCIAL HISTORY: Social History   Socioeconomic History   Marital status: Married    Spouse name: Not on file   Number of children: 5   Years of education: Not on file   Highest education level: Master's degree (e.g., MA, MS, MEng, MEd, MSW, MBA)  Occupational History   Not on file  Tobacco Use   Smoking status: Every Day     Packs/day: 0.50    Years: 50.00    Additional pack years: 0.00    Total pack years: 25.00    Types: Cigarettes    Passive exposure: Never   Smokeless tobacco: Never  Vaping Use   Vaping Use: Never used  Substance and Sexual Activity   Alcohol use: Yes    Alcohol/week: 0.0 standard drinks of alcohol    Comment: 4 to 5 times a week; beer, wine and liquor    Drug use: No   Sexual activity: Not Currently  Other Topics Concern   Not on file  Social History Narrative   Scores reading tests.  Lives with wife.     Social Determinants of Health   Financial Resource Strain: Low Risk  (05/24/2022)   Overall Financial Resource Strain (CARDIA)    Difficulty of Paying Living Expenses: Not hard at all  Food Insecurity: No Food Insecurity (08/01/2022)   Hunger Vital Sign    Worried About Running Out of Food in the Last Year: Never true    Ran Out of Food in the Last Year: Never true  Transportation Needs: No Transportation Needs (08/01/2022)   PRAPARE - Hydrologist (Medical): No    Lack of Transportation (Non-Medical): No  Physical Activity: Insufficiently Active (01/08/2022)   Exercise Vital Sign    Days of Exercise per Week: 3 days    Minutes of Exercise per Session: 30 min  Stress: No Stress Concern Present (01/08/2022)   [place]    Feeling of Stress : Not at all  Social Connections: Moderately Isolated (01/08/2022)   Social Connection and Isolation Panel [NHANES]    Frequency of Communication with Friends and Family: More than three times a week    Frequency of Social Gatherings with Friends and Family: Three times a week    Attends Religious Services: Never    Active Member of Clubs or Organizations: No    Attends Archivist Meetings: Not on file    Marital Status: Married  Human resources officer Violence: Not At Risk (06/07/2021)   Humiliation, Afraid, Rape, and Kick questionnaire     Fear of Current or Ex-Partner: No    Emotionally Abused: No    Physically Abused: No    Sexually Abused: No    FAMILY HISTORY: Family History  Problem Relation Age of Onset   Ovarian cancer Mother    Alcoholism Father    Breast cancer Sister    Ovarian cancer Sister     ALLERGIES:  is allergic to zithromax [azithromycin] and chlorpheniramine.  MEDICATIONS:  Current Outpatient Medications  Medication Sig Dispense Refill   acetaminophen (TYLENOL) 500 MG tablet Take 500 mg by mouth every 6 (six) hours as needed (for pain.).     cetirizine (ZYRTEC) 10 MG tablet Take 1 tablet (10 mg total) by mouth daily. (Patient taking differently: Take 10 mg by mouth daily as needed for allergies.) 30 tablet 11   cholecalciferol (VITAMIN D) 25 MCG (1000 UNIT) tablet  Take 1,000 Units by mouth 2 (two) times daily.     doxycycline (VIBRAMYCIN) 100 MG capsule Take 1 capsule (100 mg total) by mouth 2 (two) times daily. 14 capsule 0   fluticasone (FLONASE) 50 MCG/ACT nasal spray Place 2 sprays into both nostrils daily. 16 g 6   lidocaine-prilocaine (EMLA) cream Apply to affected area once 30 g 3   metoprolol tartrate (LOPRESSOR) 25 MG tablet Take 1 tablet (25 mg total) by mouth 2 (two) times daily as needed. 30 tablet 1   nicotine (NICODERM CQ - DOSED IN MG/24 HOURS) 21 mg/24hr patch Place 1 patch (21 mg total) onto the skin daily. Apply 21 mg patch daily x 6 wk, then 14mg  patch daily x 2 wk, then 7 mg patch daily x 2 wk 14 patch 2   omeprazole (PRILOSEC) 40 MG capsule Take 1 capsule (40 mg total) by mouth daily. 30 capsule 6   rivaroxaban (XARELTO) 20 MG TABS tablet TAKE 1 TABLET BY MOUTH ONCE DAILY WITH SUPPER 90 tablet 1   rosuvastatin (CRESTOR) 10 MG tablet Take 1 tablet (10 mg total) by mouth daily. 90 tablet 1   sodium chloride (OCEAN) 0.65 % SOLN nasal spray Place 1 spray into both nostrils as needed for congestion.     TIADYLT ER 120 MG 24 hr capsule TAKE 1 CAPSULE EVERY DAY (SCHEDULE OFFICE VISIT FOR  FUTURE REFILLS) 90 capsule 3   No current facility-administered medications for this visit.     PHYSICAL EXAMINATION: ECOG PERFORMANCE STATUS: 1 - Symptomatic but completely ambulatory  Vitals:   10/23/22 1348  BP: 127/67  Pulse: (!) 57  Resp: 16  Temp: 97.8 F (36.6 C)  SpO2: 96%   Filed Weights   10/23/22 1348  Weight: 208 lb 4.8 oz (94.5 kg)    Physical Exam Constitutional:      Appearance: Normal appearance.  Cardiovascular:     Rate and Rhythm: Normal rate and regular rhythm.  Pulmonary:     Effort: Pulmonary effort is normal.     Breath sounds: Normal breath sounds.  Abdominal:     Comments:    Musculoskeletal:        General: No swelling or tenderness. Normal range of motion.     Cervical back: Normal range of motion and neck supple. No rigidity.  Lymphadenopathy:     Cervical: No cervical adenopathy.  Skin:    General: Skin is warm and dry.  Neurological:     General: No focal deficit present.     Mental Status: He is alert.      LABORATORY DATA:  I have reviewed the data as listed Lab Results  Component Value Date   WBC 5.3 10/23/2022   HGB 14.6 10/23/2022   HCT 41.2 10/23/2022   MCV 97.6 10/23/2022   PLT 189 10/23/2022     Chemistry      Component Value Date/Time   NA 140 10/23/2022 1315   NA 141 05/16/2018 1425   K 3.8 10/23/2022 1315   CL 104 10/23/2022 1315   CO2 30 10/23/2022 1315   BUN 18 10/23/2022 1315   BUN 13 05/16/2018 1425   CREATININE 0.83 10/23/2022 1315   CREATININE 0.84 07/03/2022 0812      Component Value Date/Time   CALCIUM 9.1 10/23/2022 1315   ALKPHOS 40 10/23/2022 1315   AST 14 (L) 10/23/2022 1315   ALT 13 10/23/2022 1315   BILITOT 0.3 10/23/2022 1315       RADIOGRAPHIC STUDIES: I have  personally reviewed the radiological images as listed and agreed with the findings in the report. NM PET Image Restag (PS) Skull Base To Thigh  Result Date: 10/22/2022 CLINICAL DATA:  Subsequent treatment strategy for  head and neck cancer. EXAM: NUCLEAR MEDICINE PET SKULL BASE TO THIGH TECHNIQUE: 10.27 mCi F-18 FDG was injected intravenously. Full-ring PET imaging was performed from the skull base to thigh after the radiotracer. CT data was obtained and used for attenuation correction and anatomic localization. Fasting blood glucose: 103 mg/dl COMPARISON:  05/07/2022 FINDINGS: Mediastinal blood pool activity: SUV max 2.47 Liver activity: SUV max NA NECK: -Interval decreased radiotracer uptake associated with the left side of epiglottis with SUV max 2.82 versus 11.94 previously. -Asymmetric increased tracer uptake within the left side of the oropharynx has resolved in the interval. SUV max is currently 4.03 versus 6.99 previously. -Interval resolution of previous tracer avid cervical lymph nodes. Incidental CT findings: None. CHEST: There is a new, subtle, subpleural nodule within the anterior left upper lobe just posterior to the costochondral junction of the left first rib. This measures 7 mm with corresponding SUV max 1.94, image 21/7. Mild tracer uptake localizing to the right hilum has an SUV max of 3.54. New from the previous exam. Incidental CT findings: Centrilobular emphysema. Aortic atherosclerosis. Coronary artery calcifications. Calcifications of the mitral and aortic valves noted. ABDOMEN/PELVIS: There is no abnormal tracer uptake identified within the liver, pancreas, spleen, or adrenal glands. Aortocaval lymph node measures 1 cm and has an SUV max of 4.74, image 142/4. On the previous exam this measured 1.2 cm and had an SUV max of 5 mm. Incidental CT findings: Status post stent graft repair of the abdominal aortic aneurysm. Small scratch set tiny layering stones noted within the gallbladder. Right renal calcifications are likely vascular in origin. Sigmoid diverticulosis without acute diverticulitis. Prostate gland enlargement with calcifications. SKELETON: No focal hypermetabolic activity to suggest skeletal  metastasis. Incidental CT findings: Increased radiotracer uptake localizing to the right costovertebral junction of the T11 vertebra, SUV max equals 3.27. Underlying arthropathic changes are identified on the corresponding CT images., image 115/4. IMPRESSION: 1. Interval resolution of asymmetric increased tracer uptake associated with the left side of epiglottis and left side of oropharynx. 2. Interval resolution of previous tracer avid cervical lymph nodes. 3. There is a new, subtle, subpleural nodule within the anterior left upper lobe which exhibits mild tracer uptake, this measures. This measures 7 mm and has an SUV max of 1.94. Consider short-term interval follow-up with diagnostic CT of the chest in 3 months to assess for temporal change in the appearance of this nodule. 4. Similar appearance of previously described tracer avid lymph node within the aortocaval region of the upper abdomen. Size and degree of FDG uptake is unchanged from prior exam. No new tracer avid lymph nodes identified within the abdomen. 5. Gallstones. 6. Aortic Atherosclerosis (ICD10-I70.0) and Emphysema (ICD10-J43.9). Electronically Signed   By: Kerby Moors M.D.   On: 10/22/2022 12:03    ASSESSMENT AND PLAN:  Malignant neoplasm of supraglottis [facility]) This is a very pleasant 77 year old male patient with history of smoking now 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   Further investigation of the aortocaval lymph node showed atypical lymphocytes suggestive of lymphoproliferative disorder.  However upon further review, it appears that the lymph node that was biopsied  is not the lymph node that was desired to be biopsied.although this was abnormal.  We have requested a second biopsy for better characterization of the diagnosis.  This was inconclusive.  He had a PET scan yesterday which showed no evidence of active SCC supraglottis. There is a LUL nodule measuring about 7 mm which was recommended to be followed up in 3 months with repeat imaging. With regards to aortocaval LN, there is no change in hypermetabolic activity. At this time, he also remains clinically asymptomatic from lymphoproliferative disorder stand point. We have hence recommended that he come back for FU in 3 months. CT chest ordered to follow up on LUL nodule.  Benay Pike MD    I have spent a total of 30 minutes minutes of face-to-face and non-face-to-face time, preparing to see the patient, performing a medically appropriate examination, counseling and educating the patient, documenting clinical information in the electronic health record, and care coordination.

## 2022-10-23 NOTE — Telephone Encounter (Signed)
Labs placed, patient aware and lab appointment scheduled

## 2022-10-23 NOTE — Progress Notes (Signed)
Radiation Oncology         [phone] ________________________________  Name: Cory [name] MRN: [id number]  Date: 10/23/2022  DOB: 03-30-1946  Follow-Up Visit Note  CC: [name], MD  Melida Quitter, MD  Diagnosis and Prior Radiotherapy:       ICD-10-CM   1. 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 He completed treatment on 07-18-22. He will receive PET Scan results at this visit.   Pain issues, if any: No Using a feeding tube?: No feeding tube removed.  Weight changes, if any:  Wt Readings from Last 3 Encounters:  10/23/22 208 lb 6.4 oz (94.5 kg)  10/23/22 208 lb 4.8 oz (94.5 kg)  10/12/22 204 lb (92.5 kg)    Swallowing issues, if any: No Smoking or chewing tobacco?  He still smokes occasionally and drinks occasionally Using fluoride trays daily? No Last ENT visit was on: Patient was seen by ENT in October 2023. Other notable issues, if any:   BP 127/67   Pulse (!) 57   Temp 97.8 F (36.6 C)   Ht 6\' 3"  (1.905 m)   Wt 208 lb 6.4 oz (94.5 kg)   SpO2 96%   BMI 26.05 kg/m     ALLERGIES:  is allergic to zithromax [azithromycin] and chlorpheniramine.  Meds: Current Outpatient Medications  Medication Sig Dispense Refill   acetaminophen (TYLENOL) 500 MG tablet Take 500 mg by mouth every 6 (six) hours as needed (for pain.).     cetirizine (ZYRTEC) 10 MG tablet Take 1 tablet (10 mg total) by mouth daily. (Patient taking differently: Take 10 mg by mouth daily as needed for allergies.) 30 tablet 11   cholecalciferol (VITAMIN D) 25 MCG (1000 UNIT) tablet Take 1,000 Units by mouth 2 (two) times daily.     doxycycline (VIBRAMYCIN) 100 MG capsule Take 1 capsule (100 mg total) by mouth 2 (two) times daily. 14 capsule 0   fluticasone (FLONASE) 50 MCG/ACT nasal spray Place 2 sprays  into both nostrils daily. 16 g 6   lidocaine-prilocaine (EMLA) cream Apply to affected area once 30 g 3   metoprolol tartrate (LOPRESSOR) 25 MG tablet Take 1 tablet (25 mg total) by mouth 2 (two) times daily as needed. 30 tablet 1   nicotine (NICODERM CQ - DOSED IN MG/24 HOURS) 21 mg/24hr patch Place 1 patch (21 mg total) onto the skin daily. Apply 21 mg patch daily x 6 wk, then 14mg  patch daily x 2 wk, then 7 mg patch daily x 2 wk 14 patch 2   omeprazole (PRILOSEC) 40 MG capsule Take 1 capsule (40 mg total) by mouth daily. 30 capsule 6   rivaroxaban (XARELTO) 20 MG TABS tablet TAKE 1 TABLET BY MOUTH ONCE DAILY WITH SUPPER 90 tablet 1   rosuvastatin (CRESTOR) 10 MG tablet Take 1 tablet (10 mg total) by mouth daily. 90 tablet 1   sodium chloride (OCEAN) 0.65 % SOLN nasal spray Place 1 spray into both nostrils as needed for congestion.     TIADYLT ER 120 MG 24 hr capsule TAKE 1 CAPSULE EVERY DAY (SCHEDULE OFFICE VISIT FOR FUTURE REFILLS) 90 capsule 3   No current facility-administered medications for this encounter.    Physical Findings: The patient is in no acute distress. Patient is alert and oriented. Wt  Readings from Last 3 Encounters:  10/23/22 208 lb 6.4 oz (94.5 kg)  10/23/22 208 lb 4.8 oz (94.5 kg)  10/12/22 204 lb (92.5 kg)    height is 6\' 3"  (1.905 m) and weight is 208 lb 6.4 oz (94.5 kg). His temperature is 97.8 F (36.6 C). His blood pressure is 127/67 and his pulse is 57 (abnormal). His oxygen saturation is 96%. .  General: Alert and oriented, in no acute distress HEENT: Head is normocephalic. Extraocular movements are intact. Oropharynx is notable for no lesions Neck: Neck is notable for no masses, skin intact Skin: Skin in treatment fields shows satisfactory healing over neck Lymphatics: see Neck Exam Psychiatric: Judgment and insight are intact. Affect is appropriate.   Lab Findings: Lab Results  Component Value Date   WBC 5.3 10/23/2022   HGB 14.6 10/23/2022   HCT  41.2 10/23/2022   MCV 97.6 10/23/2022   PLT 189 10/23/2022    Lab Results  Component Value Date   TSH 0.798 05/08/2022    Radiographic Findings: NM PET Image Restag (PS) Skull Base To Thigh  Result Date: 10/22/2022 CLINICAL DATA:  Subsequent treatment strategy for head and neck cancer. EXAM: NUCLEAR MEDICINE PET SKULL BASE TO THIGH TECHNIQUE: 10.27 mCi F-18 FDG was injected intravenously. Full-ring PET imaging was performed from the skull base to thigh after the radiotracer. CT data was obtained and used for attenuation correction and anatomic localization. Fasting blood glucose: 103 mg/dl COMPARISON:  05/07/2022 FINDINGS: Mediastinal blood pool activity: SUV max 2.47 Liver activity: SUV max NA NECK: -Interval decreased radiotracer uptake associated with the left side of epiglottis with SUV max 2.82 versus 11.94 previously. -Asymmetric increased tracer uptake within the left side of the oropharynx has resolved in the interval. SUV max is currently 4.03 versus 6.99 previously. -Interval resolution of previous tracer avid cervical lymph nodes. Incidental CT findings: None. CHEST: There is a new, subtle, subpleural nodule within the anterior left upper lobe just posterior to the costochondral junction of the left first rib. This measures 7 mm with corresponding SUV max 1.94, image 21/7. Mild tracer uptake localizing to the right hilum has an SUV max of 3.54. New from the previous exam. Incidental CT findings: Centrilobular emphysema. Aortic atherosclerosis. Coronary artery calcifications. Calcifications of the mitral and aortic valves noted. ABDOMEN/PELVIS: There is no abnormal tracer uptake identified within the liver, pancreas, spleen, or adrenal glands. Aortocaval lymph node measures 1 cm and has an SUV max of 4.74, image 142/4. On the previous exam this measured 1.2 cm and had an SUV max of 5 mm. Incidental CT findings: Status post stent graft repair of the abdominal aortic aneurysm. Small scratch set  tiny layering stones noted within the gallbladder. Right renal calcifications are likely vascular in origin. Sigmoid diverticulosis without acute diverticulitis. Prostate gland enlargement with calcifications. SKELETON: No focal hypermetabolic activity to suggest skeletal metastasis. Incidental CT findings: Increased radiotracer uptake localizing to the right costovertebral junction of the T11 vertebra, SUV max equals 3.27. Underlying arthropathic changes are identified on the corresponding CT images., image 115/4. IMPRESSION: 1. Interval resolution of asymmetric increased tracer uptake associated with the left side of epiglottis and left side of oropharynx. 2. Interval resolution of previous tracer avid cervical lymph nodes. 3. There is a new, subtle, subpleural nodule within the anterior left upper lobe which exhibits mild tracer uptake, this measures. This measures 7 mm and has an SUV max of 1.94. Consider short-term interval follow-up with diagnostic CT of the chest in 3  months to assess for temporal change in the appearance of this nodule. 4. Similar appearance of previously described tracer avid lymph node within the aortocaval region of the upper abdomen. Size and degree of FDG uptake is unchanged from prior exam. No new tracer avid lymph nodes identified within the abdomen. 5. Gallstones. 6. Aortic Atherosclerosis (ICD10-I70.0) and Emphysema (ICD10-J43.9). Electronically Signed   By: Kerby Moors M.D.   On: 10/22/2022 12:03    Impression/Plan:    1) Head and Neck Cancer Status: He is in remission.  We talked about the importance of staying away from smoking and tobacco products and alcohol for best prognosis.  2) Nutritional Status: Stable weight Wt Readings from Last 3 Encounters:  10/23/22 208 lb 6.4 oz (94.5 kg)  10/23/22 208 lb 4.8 oz (94.5 kg)  10/12/22 204 lb (92.5 kg)    3) Risk Factors: The patient has been educated about risk factors including alcohol and tobacco abuse; they  understand that avoidance of alcohol and tobacco is important to prevent recurrences as well as other cancers.  While he is still using alcohol and tobacco he does not seem motivated to quit at this time  4) Swallowing: Good function  5) Dental: Encouraged to continue regular followup with dentistry, and dental hygiene including fluoride rinses.  Encouraged him to reestablish care with his local dentist today  6) Thyroid function: Check annually with PCP - Dr. Elease Hashimoto and I corresponded and he is happy to manage this Lab Results  Component Value Date   TSH 0.798 05/08/2022   7)   I reached out to Dr. Arletha Pili Iruku by message as the patient noted that she was going to rescan him to monitor the node in his abdomen.  I asked her to also add a CT of his chest to his imaging at the time of restaging scans.  I also inquired as to whether she we will place an order to remove his Port-A-Cath now   I asked our head and neck navigator, [name], to please refer back to ENT Redmond Baseman) in 41mo to reestablish care.   I will see him back in 1 year.  On date of service, in total, I spent 30 minutes on this encounter. Patient was seen in person. _____________________________________   [name], MD

## 2022-10-24 ENCOUNTER — Other Ambulatory Visit: Payer: Self-pay

## 2022-10-24 ENCOUNTER — Telehealth: Payer: Self-pay | Admitting: Hematology and Oncology

## 2022-10-24 ENCOUNTER — Ambulatory Visit: Payer: Medicare HMO

## 2022-10-24 ENCOUNTER — Ambulatory Visit: Payer: Medicare HMO | Admitting: Rehabilitation

## 2022-10-24 DIAGNOSIS — R1310 Dysphagia, unspecified: Secondary | ICD-10-CM | POA: Diagnosis not present

## 2022-10-24 DIAGNOSIS — I890 Lymphedema, not elsewhere classified: Secondary | ICD-10-CM | POA: Diagnosis not present

## 2022-10-24 DIAGNOSIS — R293 Abnormal posture: Secondary | ICD-10-CM | POA: Diagnosis not present

## 2022-10-24 DIAGNOSIS — C321 Malignant neoplasm of supraglottis: Secondary | ICD-10-CM

## 2022-10-24 NOTE — Progress Notes (Signed)
Oncology Nurse Navigator Documentation   Per patient's 10/23/22 post-treatment follow-up with Dr. Isidore Moos, sent fax to [facility] ENT Scheduling with request he be contacted and scheduled for routine post-RT follow-up with Dr. Redmond Baseman in 2 months. Notification of successful fax transmission received.   [name] RN, BSN, OCN Head & Neck Oncology Nurse [place] [facility] Phone # [phone]  Fax # [phone]

## 2022-10-24 NOTE — Therapy (Signed)
OUTPATIENT SPEECH LANGUAGE PATHOLOGY TREATMENT/RECERT-Discharge  Patient Name: Cory [name] MRN: [id number] DOB:[birth year], 77 y.o., male Today's Date: 10/24/2022  PCP: Carolann Littler, MD REFERRING PROVIDER: [name], MD   End of Session - 10/24/22 1111     Visit Number 5    Number of Visits 7    Date for SLP Re-Evaluation 10/24/22    SLP Start Time 71    SLP Stop Time  1137    SLP Time Calculation (min) 31 min    Activity Tolerance Patient tolerated treatment well                Past Medical History:  Diagnosis Date   Aortic stenosis    a. Moderate by echo 05/2020.   Arthritis    right hip   Cancer ([place])    skin cancer nose, right left, upper left basel cell   Dysrhythmia    a-fib   PAF (paroxysmal atrial fibrillation) (HCC)    Peripheral arterial disease (HCC)    Tobacco abuse    Past Surgical History:  Procedure Laterality Date   ABDOMINAL AORTIC ENDOVASCULAR STENT GRAFT N/A 08/29/2020   Procedure: ABDOMINAL AORTIC ENDOVASCULAR STENT GRAFT;  Surgeon: Elam Dutch, MD;  Location: [place];  Service: Vascular;  Laterality: N/A;   ABDOMINAL AORTOGRAM W/LOWER EXTREMITY Bilateral 06/24/2020   Procedure: ABDOMINAL AORTOGRAM W/LOWER EXTREMITY;  Surgeon: Elam Dutch, MD;  Location: [facility];  Service: Cardiovascular;  Laterality: Bilateral;   BACK SURGERY     2005   BIOPSY  05/21/2022   Procedure: BIOPSY;  Surgeon: Rush Landmark Telford Nab., MD;  Location: [facility];  Service: Gastroenterology;;   CERVICAL SPINE SURGERY     DIRECT LARYNGOSCOPY Bilateral 04/18/2022   Procedure: DIRECT LARYNGOSCOPY WITH BIOPSIES;  Surgeon: Melida Quitter, MD;  Location: [place];  Service: ENT;  Laterality: Bilateral;   EMBOLECTOMY Right 08/29/2020   Procedure: POPLITEAL AND TIBIAL EMBOLECTOMY;  Surgeon: Elam Dutch, MD;  Location: [place];  Service: Vascular;  Laterality: Right;   EMBOLIZATION Right 06/24/2020   Procedure: EMBOLIZATION;  Surgeon: Elam Dutch, MD;  Location: [facility];  Service: Cardiovascular;  Laterality: Right;   ENDARTERECTOMY FEMORAL Right 08/29/2020   Procedure: ENDARTERECTOMY FEMORAL;  Surgeon: Elam Dutch, MD;  Location: [facility];  Service: Vascular;  Laterality: Right;   ESOPHAGOGASTRODUODENOSCOPY N/A 06/27/2022   Procedure: ESOPHAGOGASTRODUODENOSCOPY (EGD);  Surgeon: Irving Copas., MD;  Location: [facility];  Service: Gastroenterology;  Laterality: N/A;   ESOPHAGOGASTRODUODENOSCOPY (EGD) WITH PROPOFOL N/A 05/21/2022   Procedure: ESOPHAGOGASTRODUODENOSCOPY (EGD) WITH PROPOFOL;  Surgeon: Rush Landmark Telford Nab., MD;  Location: [facility];  Service: Gastroenterology;  Laterality: N/A;   EUS N/A 05/21/2022   Procedure: UPPER ENDOSCOPIC ULTRASOUND (EUS) RADIAL;  Surgeon: Irving Copas., MD;  Location: [facility];  Service: Gastroenterology;  Laterality: N/A;   EUS N/A 06/27/2022   Procedure: UPPER ENDOSCOPIC ULTRASOUND (EUS) RADIAL;  Surgeon: Irving Copas., MD;  Location: [facility];  Service: Gastroenterology;  Laterality: N/A;   EYE SURGERY     FEMORAL ARTERY EXPLORATION Right 06/25/2020   Procedure: RIGHT GROIN EXPLORATION Evacuation of Hematoma  WITH REPAIR OF RIGHT Common FEMORAL ARTERY.;  Surgeon: Angelia Mould, MD;  Location: [place];  Service: Vascular;  Laterality: Right;   FEMORAL-POPLITEAL BYPASS GRAFT Left 11/22/2020   Procedure: LEFT ABOVE KNEE-BELOW KNEE BYPASS TIBIAL PERITONEAL TRUNK REVERSE IPSILATERAL GREATER SAPHENOUS VEIN;  Surgeon: Elam Dutch, MD;  Location: [place];  Service: Vascular;  Laterality:  Left;   FINE NEEDLE ASPIRATION N/A 05/21/2022   Procedure: FINE NEEDLE ASPIRATION (FNA) LINEAR;  Surgeon: Irving Copas., MD;  Location: [facility];  Service: Gastroenterology;  Laterality: N/A;   FINE NEEDLE ASPIRATION  06/27/2022   Procedure: FINE NEEDLE ASPIRATION;  Surgeon: Rush Landmark Telford Nab., MD;  Location: [facility];   Service: Gastroenterology;;   HOT HEMOSTASIS N/A 06/27/2022   Procedure: HOT HEMOSTASIS (ARGON PLASMA COAGULATION/BICAP);  Surgeon: Irving Copas., MD;  Location: [facility];  Service: Gastroenterology;  Laterality: N/A;   IR GASTROSTOMY TUBE MOD SED  06/12/2022   IR GASTROSTOMY TUBE REMOVAL  09/04/2022   IR IMAGING GUIDED PORT INSERTION  06/12/2022   LEG SURGERY     Trauma   LOWER EXTREMITY ANGIOGRAPHY Left 11/17/2020   Procedure: Lower Extremity Angiography;  Surgeon: Marty Heck, MD;  Location: [facility];  Service: Cardiovascular;  Laterality: Left;   PATCH ANGIOPLASTY Right 08/29/2020   Procedure: PATCH ANGIOPLASTY RIGHT BELOW KNEE POPLITEAL ARTERY AND RIGHT COMMON FEMORAL ARTERY;  Surgeon: Elam Dutch, MD;  Location: [facility];  Service: Vascular;  Laterality: Right;   RETINAL DETACHMENT SURGERY     RIGID ESOPHAGOSCOPY  04/18/2022   Procedure: RIGID ESOPHAGOSCOPY;  Surgeon: Melida Quitter, MD;  Location: [place];  Service: ENT;;   TONSILLECTOMY  04/18/2022   Procedure: LEFT TONSILLECTOMY;  Surgeon: Melida Quitter, MD;  Location: [place];  Service: ENT;;   ULTRASOUND GUIDANCE FOR VASCULAR ACCESS Bilateral 08/29/2020   Procedure: ULTRASOUND GUIDANCE FOR VASCULAR ACCESS;  Surgeon: Elam Dutch, MD;  Location: [place];  Service: Vascular;  Laterality: Bilateral;   Patient Active Problem List   Diagnosis Date Noted   Mucositis due to chemotherapy 07/17/2022   Port-A-Cath in place 06/21/2022   Malignant neoplasm of supraglottis ([place]) 05/08/2022   Cancer of supraglottis ([place]) 05/04/2022   Rib pain on left side 12/01/2021   Chronic sore throat 08/15/2021   Dyslipidemia 07/17/2021   AAA (abdominal aortic aneurysm) ([place]) 08/29/2020   PAD (peripheral artery disease) ([place]) 06/25/2020   Educated about COVID-19 virus infection 05/12/2020   Tobacco abuse 05/12/2020   Peripheral vascular disease ([place]) 04/25/2020   Abdominal aortic aneurysm (AAA) ([place]) 04/25/2020    Essential hypertension 05/16/2018   Medication management 05/16/2018   Piriformis syndrome of right side 05/06/2018   Allergy 10/29/2017   Recurrent sinusitis 10/29/2017   Atrial fibrillation, transient ([place]) 06/07/2015   Aortic stenosis    ABNORMAL ELECTROCARDIOGRAM 07/14/2009   SPEECH THERAPY RECERT-DISCHARGE SUMMARY  Visits from Start of Care: 5  Current functional level related to goals / functional outcomes: Current LTGs will be enforced today and then pt will be d/c'd.  See below.   Remaining deficits: None noted.   Education / Equipment: See "today's treatment" for details.    Patient agrees to discharge. Patient goals were met. Patient is being discharged due to meeting the stated rehab goals..    ONSET DATE: See "pertinent history"   REFERRING DIAG: Malignant neoplasm of supraglottis, Squamous cell, Stage IVA   THERAPY DIAG:  Dysphagia, unspecified type  Rationale for Evaluation and Treatment Rehabilitation  SUBJECTIVE:   SUBJECTIVE STATEMENT: "I am eating anything."   Pt accompanied by: self  PERTINENT HISTORY:  He presented to Dr. Redmond Baseman on 08/15/21 with c/o persistent left sided sore throat since August 2022. Laryngoscopy completed at this time was unremarkable. 11/08/21 CT neck revealed a sub-centimeter focus of mucosal hyperenhancement along the posterior aspect of the uvula, and a few tiny cystic  appearing foci along the left glosso-tonsillar sulcus. CT also showed a nonspecific mildly enlarged right level 2 lymph node measuring 14 mm in the short axis, and multiple small calcific foci within the left palatine tonsil possibly reflective of post-inflammatory calcifications and/or tonsilloliths. 11/21/21 repeat Laryngoscopy by Dr. Redmond Baseman again was unremarkable. Due to persistent throat pain Dr. Redmond Baseman advised proceeding with a direct laryngoscopy and rigid esophagosopy with biopsies. 04/18/22 Left laryngeal biopsies showed invasive moderately differentiated SCC.   05/07/22 PET completed showing Asymmetric Hypermetabolism in the left oropharynx and involving the left epiglottis. Imaging features compatible with primary neoplasm. (I believe some of the oropharynx SUV uptake could be r/t abscess, recent biopsy), Contralateral (right-sided) hypermetabolic level II cervical lymph nodes compatible with metastatic disease. No hypermetabolic lymphadenopathy in the left neck, and 1.2 cm short axis aortocaval lymph node in the upper abdomen is hypermetabolic. Metastatic disease is a concern. This lymph node has increased from 0.7 cm on a study from 2022. (I've messaged GI providers to see if this could be bx'd by endoscopy).  05/21/22 Biopsy of the lymph node in his abdomen  with CT simulation planned for 05/23/22. Treatment plan will depend on results of the biopsy on 10/16 but will most likely be weekly chemo and 35 fractions of radiation. Treatment plan:  He will receive 35 fractions of radiation and weekly chemotherapy to his larynx and bilateral neck.  He is scheduled to start on 05/30/22 and complete 07/19/22. PEG/PAC pending.  PAIN:  Are you having pain? No   PATIENT GOALS: Maintain WNL swallowing  OBJECTIVE:   TODAY'S TREATMENT: 10/24/22: Pt was independent with HEP today, however stated he has completed it with suboptimal frequency since last session. SLP reiterated the need to complete as directed. Pt ate crackers and drank water today without any overt s/sx oral or pharyngeal deficits. He used liquid wash due to xerostomia. He demo'd understanding of when to decr HEP frequency to twice a week. See "pt instructions" for further education.  09/04/22: "I'm getting my feeding tube out today." Pt indicates his frequency of HEP has improved dramatically since last session. He told SLP date he could reduce frequency to x2-3/week. HEP procedure was WNL today. Pt ate peanut butter crackers with liquid wash, no indication of any deficits in oral stage, and no overt s/sx  pharyngeal difficulty. SLP to cont with pt every other month. Pt agrees. Possible d/c next session if progress continues.  08/02/22: "I really don't have any problems when I'm eating." Pt complains of xerostomia and of ageusia except for onion and garlic. Today pt ate cereal bar and drank water without overt s/sx of oral or pharyngeal deficts. Pt told SLP he has not completed HEP at prescribed frequency for the last 2-3 weeks. SLP reiterated rationale for frequency and for HEP in general and reminded pt about completing in this manner until 01/18/23, and then twice/three times a week after that. Pt completed today with SBA for tongue protrusion with [place]. If pt maintains progress next visit, consider every other month  06/21/22: Pt not performing HEP at prescribed frequency. SLP reminded pt at least x10 reps twice a day was target completion. Pt told SLP rationale for HEP without cues. Procedure with HEP today was WNL - pt was independent. SLP explained/reminded pt to cycle through HEP if he needs to do so before next ST appointment. SLP educated pt on overt s/sx aspiration PNA.  Pt ate ham sandwich and drank water without any overt s/sx oral or pharyngeal  deficits; he reports no overt s/sx dysphagia with POs to date.    05/24/22: Research states the risk for dysphagia increases due to radiation and/or chemotherapy treatment due to a variety of factors, so SLP educated the pt about the possibility of reduced/limited ability for PO intake during rad tx. SLP also educated pt regarding possible changes to swallowing musculature after rad tx, and why adherence to dysphagia HEP provided today and PO consumption was necessary to inhibit muscle fibrosis following rad tx and to mitigate muscle disuse atrophy. SLP informed pt why this would be detrimental to their swallowing status and to their pulmonary health. Pt demonstrated understanding of these things to SLP. SLP encouraged pt to safely eat and drink as deep  into their radiation/chemotherapy as possible to provide the best possible long-term swallowing outcome for pt.    SLP then developed an individualized HEP for pt involving oral and pharyngeal and vocal  strengthening and ROM and pt was instructed how to perform these exercises, including SLP demonstration. After SLP demonstration, pt return demonstrated each exercise. SLP ensured pt performance was correct prior to educating pt on next exercise. Pt required min cues faded to modified independent to perform HEP. Pt was instructed to complete this program 6-7 days/week, at least 2 times a day until 6 months after his or her last day of rad tx, and then x2 a week after that, indefinitely. Modifications for days when pt cannot perform HEP as prescribed include cycle through the full program of exercises instead of fatiguing on one of the swallowing exercises and being unable to perform the others. SLP instructed that swallowing exercises should then be increased back as pt is able to do so. Secondly, pt was told that former patients have told SLP that during their course of radiation therapy, taking prescribed pain medication just prior to performing HEP (and eating/drinking) has proven helpful in completing HEP (and eating and drinking) more regularly when going through their course of radiation treatment.   PATIENT EDUCATION: Education details: late effects head/neck radiation on swallow function Person educated: Patient Education method: Explanation Education comprehension: verbalized understanding   ASSESSMENT:  CLINICAL IMPRESSION: Recert/ d/c today. Patient is a 77 y.o. male who was seen today for treatment of swallowing after undergoing radiation/chemoradiation therapy. Today pt ate items from regular diet and drank thin liquids without overt s/s oral or pharyngeal difficulty. At this time pt swallowing is deemed WNL/WFL with these POs. No oral or overt s/sx pharyngeal deficits, including  aspiration were observed. Pt's completion of HEP has improved since last session. Pt had chest congestion cough but denied presence of fever or other s/sx aspiration PNA.  Data indicate that pt's swallow ability could very well decline over time following the conclusion of t CRT due to muscle disuse atrophy and/or muscle fibrosis. Pt will cont to need to be seen by SLP in order to assess safety of PO intake, assess the need for recommending any objective swallow assessment, and ensuring pt is correctly completing the individualized HEP. He agrees with d/c today.  OBJECTIVE IMPAIRMENTS: include dysphagia. These impairments are limiting patient from safety when swallowing. Factors affecting potential to achieve goals and functional outcome are  none . Patient will benefit from skilled SLP services to address above impairments and improve overall function.  REHAB POTENTIAL: Good    GOALS: Goals reviewed with patient? No   SHORT TERM GOALS: Target date:  2 therapy visits (visit #3)       pt will complete  HEP with rare min A  Baseline: Goal status: Met   2.  pt will tell SLP why pt is completing HEP with modified independence Baseline:  Goal status: Met   3.  pt will describe 3 overt s/s aspiration PNA with modified independence Baseline:  Goal status: Met     LONG TERM GOALS: Target date:  6 therapy visits (visit #7)      pt will complete HEP with modified independnence in 2 sessions Baseline: 08/02/22 Goal status: Met   2.  pt will describe how to modify HEP over time, and the timeline associated with reduction in HEP frequency with modified independence over two sessions Baseline: 09/04/22 Goal status: Met     PLAN: Discharge today after one session enforcing LTGs   PLANNED INTERVENTIONS: Aspiration precaution training, Pharyngeal strengthening exercises, Diet toleration management , Trials of upgraded texture/liquids, Internal/external aids, SLP instruction and feedback,  Compensatory strategies, and Patient/family education     [facility], [place] 10/24/2022, 11:24 AM

## 2022-10-24 NOTE — Therapy (Addendum)
 OUTPATIENT PHYSICAL THERAPY ONCOLOGY TREATMENT  Patient Name: Cory [name] MRN: [id number] DOB:October 11, 1945, 77 y.o., male Today's Date: 10/24/2022  END OF SESSION:  PT End of Session - 10/24/22 1002     Authorization Type 4 visits 10/18/22-11/14/22    Authorization - Visit Number 1    Authorization - Number of Visits 4              Past Medical History:  Diagnosis Date   Aortic stenosis    a. Moderate by echo 05/2020.   Arthritis    right hip   Cancer (HCC)    skin cancer nose, right left, upper left basel cell   Dysrhythmia    a-fib   PAF (paroxysmal atrial fibrillation) (HCC)    Peripheral arterial disease (HCC)    Tobacco abuse    Past Surgical History:  Procedure Laterality Date   ABDOMINAL AORTIC ENDOVASCULAR STENT GRAFT N/A 08/29/2020   Procedure: ABDOMINAL AORTIC ENDOVASCULAR STENT GRAFT;  Surgeon: Sherren Kerns, MD;  Location: [facility] OR;  Service: Vascular;  Laterality: N/A;   ABDOMINAL AORTOGRAM W/LOWER EXTREMITY Bilateral 06/24/2020   Procedure: ABDOMINAL AORTOGRAM W/LOWER EXTREMITY;  Surgeon: Sherren Kerns, MD;  Location: [facility];  Service: Cardiovascular;  Laterality: Bilateral;   BACK SURGERY     2005   BIOPSY  05/21/2022   Procedure: BIOPSY;  Surgeon: Meridee Score Netty Starring., MD;  Location: [facility];  Service: Gastroenterology;;   CERVICAL SPINE SURGERY     DIRECT LARYNGOSCOPY Bilateral 04/18/2022   Procedure: DIRECT LARYNGOSCOPY WITH BIOPSIES;  Surgeon: Christia Reading, MD;  Location: [facility] OR;  Service: ENT;  Laterality: Bilateral;   EMBOLECTOMY Right 08/29/2020   Procedure: POPLITEAL AND TIBIAL EMBOLECTOMY;  Surgeon: Sherren Kerns, MD;  Location: [facility] OR;  Service: Vascular;  Laterality: Right;   EMBOLIZATION Right 06/24/2020   Procedure: EMBOLIZATION;  Surgeon: Sherren Kerns, MD;  Location: [facility];  Service: Cardiovascular;  Laterality: Right;   ENDARTERECTOMY FEMORAL Right 08/29/2020   Procedure: ENDARTERECTOMY FEMORAL;   Surgeon: Sherren Kerns, MD;  Location: [facility] OR;  Service: Vascular;  Laterality: Right;   ESOPHAGOGASTRODUODENOSCOPY N/A 06/27/2022   Procedure: ESOPHAGOGASTRODUODENOSCOPY (EGD);  Surgeon: Lemar Lofty., MD;  Location: [facility];  Service: Gastroenterology;  Laterality: N/A;   ESOPHAGOGASTRODUODENOSCOPY (EGD) WITH PROPOFOL N/A 05/21/2022   Procedure: ESOPHAGOGASTRODUODENOSCOPY (EGD) WITH PROPOFOL;  Surgeon: Meridee Score Netty Starring., MD;  Location: [facility] END[facility]COPY;  Service: Gastroenterology;  Laterality: N/A;   EUS N/A 05/21/2022   Procedure: UPPER ENDOSCOPIC ULTRASOUND (EUS) RADIAL;  Surgeon: Lemar Lofty., MD;  Location: [facility];  Service: Gastroenterology;  Laterality: N/A;   EUS N/A 06/27/2022   Procedure: UPPER ENDOSCOPIC ULTRASOUND (EUS) RADIAL;  Surgeon: Lemar Lofty., MD;  Location: [facility];  Service: Gastroenterology;  Laterality: N/A;   EYE SURGERY     FEMORAL ARTERY EXPLORATION Right 06/25/2020   Procedure: RIGHT GROIN EXPLORATION Evacuation of Hematoma  WITH REPAIR OF RIGHT Common FEMORAL ARTERY.;  Surgeon: Chuck Hint, MD;  Location: [facility] OR;  Service: Vascular;  Laterality: Right;   FEMORAL-POPLITEAL BYPASS GRAFT Left 11/22/2020   Procedure: LEFT ABOVE KNEE-BELOW KNEE BYPASS TIBIAL PERITONEAL TRUNK REVERSE IPSILATERAL GREATER SAPHENOUS VEIN;  Surgeon: Sherren Kerns, MD;  Location: [facility] OR;  Service: Vascular;  Laterality: Left;   FINE NEEDLE ASPIRATION N/A 05/21/2022   Procedure: FINE NEEDLE ASPIRATION (FNA) LINEAR;  Surgeon: Lemar Lofty., MD;  Location: [facility];  Service: Gastroenterology;  Laterality: N/A;   FINE NEEDLE ASPIRATION  06/27/2022   Procedure: FINE NEEDLE ASPIRATION;  Surgeon: Meridee Score Netty Starring., MD;  Location: [facility];  Service: Gastroenterology;;   HOT HEMOSTASIS N/A 06/27/2022   Procedure: HOT HEMOSTASIS (ARGON PLASMA COAGULATION/BICAP);  Surgeon: Lemar Lofty., MD;  Location: [facility];  Service: Gastroenterology;  Laterality: N/A;   IR GASTROSTOMY TUBE MOD SED  06/12/2022   IR GASTROSTOMY TUBE REMOVAL  09/04/2022   IR IMAGING GUIDED PORT INSERTION  06/12/2022   LEG SURGERY     Trauma   LOWER EXTREMITY ANGIOGRAPHY Left 11/17/2020   Procedure: Lower Extremity Angiography;  Surgeon: Cephus Shelling, MD;  Location: [facility] INVASIVE CV LAB;  Service: Cardiovascular;  Laterality: Left;   PATCH ANGIOPLASTY Right 08/29/2020   Procedure: PATCH ANGIOPLASTY RIGHT BELOW KNEE POPLITEAL ARTERY AND RIGHT COMMON FEMORAL ARTERY;  Surgeon: Sherren Kerns, MD;  Location: [facility] OR;  Service: Vascular;  Laterality: Right;   RETINAL DETACHMENT SURGERY     RIGID ESOPHAGOSCOPY  04/18/2022   Procedure: RIGID ESOPHAGOSCOPY;  Surgeon: Christia Reading, MD;  Location: [facility] OR;  Service: ENT;;   TONSILLECTOMY  04/18/2022   Procedure: LEFT TONSILLECTOMY;  Surgeon: Christia Reading, MD;  Location: [facility] OR;  Service: ENT;;   ULTRASOUND GUIDANCE FOR VASCULAR ACCESS Bilateral 08/29/2020   Procedure: ULTRASOUND GUIDANCE FOR VASCULAR ACCESS;  Surgeon: Sherren Kerns, MD;  Location: [facility] OR;  Service: Vascular;  Laterality: Bilateral;   Patient Active Problem List   Diagnosis Date Noted   Mucositis due to chemotherapy 07/17/2022   Port-A-Cath in place 06/21/2022   Malignant neoplasm of supraglottis (HCC) 05/08/2022   Cancer of supraglottis (HCC) 05/04/2022   Rib pain on left side 12/01/2021   Chronic sore throat 08/15/2021   Dyslipidemia 07/17/2021   AAA (abdominal aortic aneurysm) (HCC) 08/29/2020   PAD (peripheral artery disease) (HCC) 06/25/2020   Educated about COVID-19 virus infection 05/12/2020   Tobacco abuse 05/12/2020   Peripheral vascular disease (HCC) 04/25/2020   Abdominal aortic aneurysm (AAA) (HCC) 04/25/2020   Essential hypertension 05/16/2018   Medication management 05/16/2018   Piriformis syndrome of right side 05/06/2018   Allergy 10/29/2017   Recurrent sinusitis 10/29/2017   Atrial  fibrillation, transient (HCC) 06/07/2015   Aortic stenosis    ABNORMAL ELECTROCARDIOGRAM 07/14/2009    PCP: Evelena Peat, MD  REFERRING PROVIDER: Lonie Peak, MD  REFERRING DIAG: C32.1 (ICD-10-CM) - Malignant neoplasm of supraglottis (HCC)   THERAPY DIAG:  No diagnosis found.  ONSET DATE: 11/08/21  Rationale for Evaluation and Treatment: Rehabilitation  SUBJECTIVE:  SUBJECTIVE STATEMENT: I got my garment and it fits well.  I just didn't bring it.   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 He presented to Dr. Jenne Pane on 08/15/21 with c/o persistent left sided sore throat since August 2022. Laryngoscopy completed at this time was unremarkable. 11/08/21 CT neck revealed a sub-centimeter focus of mucosal hyperenhancement along the posterior aspect of the uvula, and a few tiny cystic appearing foci along the left glosso-tonsillar sulcus. CT also showed a nonspecific mildly enlarged right level 2 lymph node measuring 14 mm in the short axis, and multiple small calcific foci within the left palatine tonsil possibly reflective of postinflammatory calcifications and/or tonsilloliths. 11/21/21 repeat Laryngoscopy by Dr. Jenne Pane again was unremarkable. Due to persistent throat pain Dr. Jenne Pane advised proceeding with a direct larynscopy and rigid esophagosopy with biopsies.  04/18/22 Left laryngeal biopsies showed invasive moderately differentiated SCC. 05/07/22 PET completed showing Asymmetric Hypermetabolism in the left oropharynx and involving the left epiglottis. Imaging features compatible with primary neoplasm. (I believe some of the oropharynx SUV uptake could be r/t abscess, recent biopsy), Contralateral (right-sided) hypermetabolic level II cervical lymph nodes  compatible with metastatic disease. No hypermetabolic lymphadenopathy in the left neck, and 1.2 cm short axis aortocaval lymph node in the upper abdomen is hypermetabolic. Metastatic disease is a concern. This lymph node has increased from 0.7 cm on a study from 2022. 05/21/22 Biopsy of the lymph node in his abdomen with CT simulation planned for 05/23/22. Treatment plan will depend on results of the biopsy on 10/16 but will most likely be weekly chemo and 35 fractions of radiation. He will receive 35 fractions of radiation and weekly chemotherapy to his larynx and bilateral neck.  He is scheduled to start on 05/30/22 and complete 07/19/22  PAIN:  Are you having pain? No  PRECAUTIONS: Other: neck lymphedema  WEIGHT BEARING RESTRICTIONS: No  FALLS:  Has patient fallen in last 6 months? 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  LEISURE: walks 20-30 min/day  HAND DOMINANCE: right   PRIOR LEVEL OF FUNCTION: Independent  PATIENT GOALS: to get rid of the swelling   OBJECTIVE:  COGNITION: Overall cognitive status: Within functional limits for tasks assessed   PALPATION: Fullness palpable at anterior neck  OBSERVATIONS / OTHER ASSESSMENTS: fullness at anterior neck  POSTURE: forward head/rounded shoulders   CERVICAL AROM: All within normal limits:    Percent limited  Flexion WFL  Extension WFL  Right lateral flexion WFL  Left lateral flexion WFL  Right rotation [facility]  Left rotation [facility]    LYMPHEDEMA ASSESSMENT:      Circumference in cm 08/03/23 09/19/22 09/27/22 10/01/22 10/17/22  4 cm superior to sternal notch around neck 39.6 43.6 43.9 43.5 44.2  6 cm superior to sternal notch around neck 40.9 44.5 44.5 45 44.2  8 cm  superior to sternal notch around neck 41 46 45.1 45.2 45  R lateral nostril from base of nose to medial tragus        L lateral nostril from base of nose to medial tragus        R corner of mouth to where ear lobe meets face        L corner of mouth to where ear  lobe meets face                          (Blank rows=not tested)   CURRENT/PAST TREATMENTS:   Surgery type/date: Several biopsies in Oct 2023   Chemotherapy: Completed 07/19/22   Radiation: Completed 07/19/22  LLIS: 4   TODAY'S TREATMENT:                                                                                                                                         DATE:   10/24/22- short neck, 5 diaphragmatic breaths, bilateral axillary nodes, bilateral pectoral nodes, anterior chest, short neck, posterior neck moving fluid towards pathway aimed at lateral neck, then lateral and anterior neck moving fluid towards pathway aimed at lateral neck then retracing all steps.    10/17/22- short neck, 5 diaphragmatic breaths, bilateral axillary nodes, bilateral pectoral nodes, anterior chest, short neck, posterior neck moving fluid towards pathway aimed at lateral neck, then lateral and anterior neck moving fluid towards pathway aimed at lateral neck then retracing all steps. Measured circumferences.   10/01/22- short neck, 5 diaphragmatic breaths, bilateral axillary nodes, bilateral pectoral nodes, anterior chest, short neck, posterior neck moving fluid towards pathway aimed at lateral neck, then lateral and anterior neck moving fluid towards pathway aimed at lateral neck then retracing all steps. Measured circumferences per pt request but with no significant changes.    PATIENT EDUCATION:  Education details: anatomy and physiology of the lymphatic system, basic principles of MLD, compression garment/chip pack Person educated: Patient Education method: Medical illustrator Education comprehension: verbalized  understanding  HOME EXERCISE PROGRAM: Wear chip pack as much as possible  ASSESSMENT:  CLINICAL IMPRESSION:  1 more visit to check garment fit and DC.   OBJECTIVE IMPAIRMENTS: decreased knowledge of condition, decreased knowledge of use of DME, increased edema, and postural dysfunction.   ACTIVITY LIMITATIONS:  none  PARTICIPATION LIMITATIONS:  none  PERSONAL FACTORS:  none  are also affecting patient's functional outcome.   REHAB POTENTIAL: Good  CLINICAL DECISION MAKING: Stable/uncomplicated  EVALUATION COMPLEXITY: Low  GOALS: Goals reviewed with patient? Yes  SHORT TERM GOALS=LONG TERM GOALS Target date: 09/18/22  Pt will be independent in self MLD for long term management of lymphedema.  Baseline: Goal status: MET 10/17/22 pt feels independent with this  2.  Pt will obtain appropriate compression garments for management of neck lymphedema.  Baseline:  Goal status: IN PROGRESS 10/17/22- pt was issued info for obtaining this online  3.  Pt will demonstrate a 2 cm decrease at 8 cm superior to sternal notch to decrease risk of infection.  Baseline:  Goal status: IN PROGRESS 10/17/22 pt reports he was sick last week and did not do much so he has decreased 0.2 cm  PLAN:  PT FREQUENCY: 1x/week  PT DURATION: 4 weeks  PLANNED INTERVENTIONS: Therapeutic exercises, Therapeutic activity,  Patient/Family education, Self Care, Orthotic/Fit training, Manual lymph drainage, Compression bandaging, Taping, Vasopneumatic device, Manual therapy, and Re-evaluation  PLAN FOR NEXT SESSION: MLD neck, assess garment for fit and d/c if fit is good  PHYSICAL THERAPY DISCHARGE SUMMARY  Visits from Start of Care: 5  Current functional level related to goals / functional outcomes: Per above   Remaining deficits: lymphedema risk   Education / Equipment: Self care HEP   Plan: Patient agrees to discharge.       Idamae Lusher, PT 10/24/2022, 10:03 AM

## 2022-10-24 NOTE — Telephone Encounter (Signed)
Spoke with patient confirming upcoming appointments  

## 2022-10-24 NOTE — Patient Instructions (Addendum)
   Signs you may have developed trouble with your swallowing:  Items you used to eat or drink well,  you now have difficulty passing through the throat or are choking or coughing  when  you eat or drink them  You are clearing your throat very often when you are eating or drinking  You have a "wet" voice when  you are eating or drinking  If you have one or more of these signs contact your ENT or Dr. Isidore Moos (if she is still following you)  CONTINUE TO DO YOUR EXERCISES UNTIL 6 MONTHS AFTER YOUR LAST DAY OF RADIATION, THEN 2-3 TIMES PER WEEK (once a day) AFTER THAT.

## 2022-10-31 ENCOUNTER — Ambulatory Visit: Payer: Medicare HMO | Admitting: Rehabilitation

## 2022-11-05 ENCOUNTER — Telehealth: Payer: Self-pay | Admitting: Family Medicine

## 2022-11-05 DIAGNOSIS — C44622 Squamous cell carcinoma of skin of right upper limb, including shoulder: Secondary | ICD-10-CM | POA: Diagnosis not present

## 2022-11-05 NOTE — Telephone Encounter (Signed)
Called patient to schedule Medicare Annual Wellness Visit (AWV). Left message for patient to call back and schedule Medicare Annual Wellness Visit (AWV).  Last date of AWV: 06/07/21  Please schedule an appointment at any time with NHA beverly or hannah kim.  If any questions, please contact me at [phone].  Thank you ,  Barkley Boards AWV direct phone # [phone]

## 2022-11-06 ENCOUNTER — Other Ambulatory Visit: Payer: Self-pay

## 2022-11-14 ENCOUNTER — Other Ambulatory Visit: Payer: Self-pay | Admitting: Family Medicine

## 2022-11-21 ENCOUNTER — Ambulatory Visit (HOSPITAL_COMMUNITY)
Admission: RE | Admit: 2022-11-21 | Discharge: 2022-11-21 | Disposition: A | Discharge status: Home / Self Care | Payer: Medicare HMO | Source: Ambulatory Visit | Attending: Hematology and Oncology | Admitting: Hematology and Oncology

## 2022-11-21 DIAGNOSIS — C321 Malignant neoplasm of supraglottis: Secondary | ICD-10-CM | POA: Diagnosis not present

## 2022-11-21 DIAGNOSIS — Z9221 Personal history of antineoplastic chemotherapy: Secondary | ICD-10-CM | POA: Diagnosis not present

## 2022-11-21 DIAGNOSIS — Z452 Encounter for adjustment and management of vascular access device: Secondary | ICD-10-CM | POA: Insufficient documentation

## 2022-11-21 DIAGNOSIS — Z8589 Personal history of malignant neoplasm of other organs and systems: Secondary | ICD-10-CM | POA: Insufficient documentation

## 2022-11-21 HISTORY — PX: IR REMOVAL TUN ACCESS W/ PORT W/O FL MOD SED: IMG2290

## 2022-11-21 MED ORDER — LIDOCAINE-EPINEPHRINE 1 %-1:100000 IJ SOLN
INTRAMUSCULAR | Status: AC
  Filled 2022-11-21: qty 1

## 2022-11-21 MED ORDER — LIDOCAINE-EPINEPHRINE 1 %-1:100000 IJ SOLN
20.0000 mL | Freq: Once | INTRAMUSCULAR | Status: AC
  Administered 2022-11-21: 13 mL via INTRADERMAL

## 2022-11-21 NOTE — Procedures (Signed)
Interventional Radiology Procedure:   Indications: Completed chemotherapy for head and neck cancer  Procedure: Port removal  Findings: Complete removal of right chest port  Complications: None     EBL: Minimal  Plan: Discharge to home.  Keep incision dry for 24 hours.   Ashritha Desrosiers R. Lowella Dandy, MD  Pager: [phone]

## 2022-12-19 ENCOUNTER — Telehealth: Payer: Self-pay | Admitting: Family Medicine

## 2022-12-19 NOTE — Telephone Encounter (Signed)
Called patient to schedule Medicare Annual Wellness Visit (AWV). Left message for patient to call back and schedule Medicare Annual Wellness Visit (AWV).  Last date of AWV: 06/07/21  Please schedule an appointment at any time with NHA Bevelry or hannah kim .  If any questions, please contact me at [phone].  Thank you ,  Rudell Cobb AWV direct phone # [phone]

## 2022-12-26 DIAGNOSIS — C321 Malignant neoplasm of supraglottis: Secondary | ICD-10-CM | POA: Diagnosis not present

## 2022-12-26 DIAGNOSIS — J342 Deviated nasal septum: Secondary | ICD-10-CM | POA: Diagnosis not present

## 2022-12-28 ENCOUNTER — Other Ambulatory Visit: Payer: Self-pay | Admitting: Physician Assistant

## 2023-01-22 ENCOUNTER — Encounter: Payer: Self-pay | Admitting: Hematology and Oncology

## 2023-01-23 ENCOUNTER — Ambulatory Visit (HOSPITAL_COMMUNITY)
Admission: RE | Admit: 2023-01-23 | Discharge: 2023-01-23 | Disposition: A | Discharge status: Home / Self Care | Payer: Medicare HMO | Source: Ambulatory Visit | Attending: Hematology and Oncology | Admitting: Hematology and Oncology

## 2023-01-23 ENCOUNTER — Encounter (HOSPITAL_COMMUNITY): Payer: Self-pay

## 2023-01-23 ENCOUNTER — Other Ambulatory Visit: Payer: Medicare HMO

## 2023-01-23 DIAGNOSIS — C321 Malignant neoplasm of supraglottis: Secondary | ICD-10-CM

## 2023-01-23 DIAGNOSIS — D479 Neoplasm of uncertain behavior of lymphoid, hematopoietic and related tissue, unspecified: Secondary | ICD-10-CM

## 2023-01-23 DIAGNOSIS — R918 Other nonspecific abnormal finding of lung field: Secondary | ICD-10-CM | POA: Diagnosis not present

## 2023-01-23 DIAGNOSIS — J439 Emphysema, unspecified: Secondary | ICD-10-CM | POA: Diagnosis not present

## 2023-01-23 DIAGNOSIS — Z79899 Other long term (current) drug therapy: Secondary | ICD-10-CM

## 2023-01-23 LAB — TSH: TSH: 1.6 u[IU]/mL (ref 0.35–5.50)

## 2023-01-24 ENCOUNTER — Inpatient Hospital Stay: Payer: Medicare HMO | Attending: Radiation Oncology | Admitting: Hematology and Oncology

## 2023-01-24 ENCOUNTER — Inpatient Hospital Stay: Payer: Medicare HMO

## 2023-01-24 ENCOUNTER — Other Ambulatory Visit: Payer: Self-pay

## 2023-01-24 VITALS — BP 119/68 | HR 57 | Temp 97.7°F | Resp 16 | Wt 201.8 lb

## 2023-01-24 DIAGNOSIS — C321 Malignant neoplasm of supraglottis: Secondary | ICD-10-CM

## 2023-01-24 DIAGNOSIS — R911 Solitary pulmonary nodule: Secondary | ICD-10-CM | POA: Diagnosis not present

## 2023-01-24 LAB — CBC WITH DIFFERENTIAL/PLATELET
Abs Immature Granulocytes: 0.03 10*3/uL (ref 0.00–0.07)
Basophils Absolute: 0.1 10*3/uL (ref 0.0–0.1)
Basophils Relative: 1 %
Eosinophils Absolute: 0.3 10*3/uL (ref 0.0–0.5)
Eosinophils Relative: 6 %
HCT: 41.5 % (ref 39.0–52.0)
Hemoglobin: 14.2 g/dL (ref 13.0–17.0)
Immature Granulocytes: 1 %
Lymphocytes Relative: 23 %
Lymphs Abs: 1.2 10*3/uL (ref 0.7–4.0)
MCH: 33.8 pg (ref 26.0–34.0)
MCHC: 34.2 g/dL (ref 30.0–36.0)
MCV: 98.8 fL (ref 80.0–100.0)
Monocytes Absolute: 0.6 10*3/uL (ref 0.1–1.0)
Monocytes Relative: 12 %
Neutro Abs: 3 10*3/uL (ref 1.7–7.7)
Neutrophils Relative %: 57 %
Platelets: 180 10*3/uL (ref 150–400)
RBC: 4.2 MIL/uL — ABNORMAL LOW (ref 4.22–5.81)
RDW: 13.8 % (ref 11.5–15.5)
WBC: 5.3 10*3/uL (ref 4.0–10.5)
nRBC: 0 % (ref 0.0–0.2)

## 2023-01-24 LAB — CMP (CANCER CENTER ONLY)
ALT: 13 U/L (ref 0–44)
AST: 14 U/L — ABNORMAL LOW (ref 15–41)
Albumin: 3.7 g/dL (ref 3.5–5.0)
Alkaline Phosphatase: 36 U/L — ABNORMAL LOW (ref 38–126)
Anion gap: 3 — ABNORMAL LOW (ref 5–15)
BUN: 18 mg/dL (ref 8–23)
CO2: 30 mmol/L (ref 22–32)
Calcium: 9.5 mg/dL (ref 8.9–10.3)
Chloride: 104 mmol/L (ref 98–111)
Creatinine: 0.97 mg/dL (ref 0.61–1.24)
GFR, Estimated: >60 mL/min (ref >60)
Glucose, Bld: 102 mg/dL — ABNORMAL HIGH (ref 70–99)
Potassium: 4.3 mmol/L (ref 3.5–5.1)
Sodium: 137 mmol/L (ref 135–145)
Total Bilirubin: 0.3 mg/dL (ref 0.3–1.2)
Total Protein: 5.9 g/dL — ABNORMAL LOW (ref 6.5–8.1)

## 2023-01-24 LAB — LACTATE DEHYDROGENASE: LDH: 101 U/L (ref 98–192)

## 2023-01-24 NOTE — Assessment & Plan Note (Addendum)
This is a very pleasant 77 year old male patient with history of smoking now 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   Further investigation of the aortocaval lymph node showed atypical lymphocytes suggestive of lymphoproliferative disorder.  However upon further review, it appears that the lymph node that was biopsied is not the lymph node that was desired to be biopsied.although this was abnormal.  We have requested a second biopsy for better characterization of the diagnosis. This was inconclusive. We have hence recommended that he come back for FU in 3 months. CT chest ordered to follow up on LUL nodule.,  This has been done yesterday but pending read.  No major changes on my read.  He continues to remain clinically asymptomatic from the lymphoproliferative disorder standpoint.  He also had a recent laryngoscopy to evaluate the supraglottic tumor and had no evidence of recurrence from Dr. Jenne Pane note. We will continue to monitor him every 3 months given the underlying lymphoproliferative disorder as well.  At this time will try to order pet imaging as needed.  Will monitor with CBC, CMP, LDH and clinically. Rachel Moulds MD

## 2023-01-24 NOTE — Progress Notes (Signed)
[facility] CONSULT NOTE  Patient Care Team: Kristian Covey, MD as PCP - General Rollene Rotunda, MD as PCP - Cardiology (Cardiology) Verner Chol, [facility] (Inactive) as Pharmacist (Pharmacist) Malmfelt, Lise Auer, RN as Oncology Nurse Navigator Lonie Peak, MD as Consulting Physician (Radiation Oncology) Rachel Moulds, MD as Consulting Physician (Hematology and Oncology)  CHIEF COMPLAINTS/PURPOSE OF CONSULTATION:  SCC supraglottis  ONCOLOGIC HISTORY: Initially presented to ENT with chronic left-sided sore throat for almost a year.  Initial evaluation did not show any possible causes for sore throat.  He was recommended PPI twice daily but went back to ENT with persistent sore throat he once again had a repeat fiberoptic exam which was stable.   CT neck done on November 08, 2021 did show subcentimeter focus of mucosal hyperenhancement along the posterior aspect of the uvula  laryngoscopy with direct visualization recommended to exclude a mucosal lesion at the site.  Additionally few tiny cystic-appearing foci along the left glossotonsillar sulcus.  Direct visualization recommended.  Nonspecific mildly enlarged right level 2 lymph node.  Multiple small calcific foci in the left palatine tonsil.Repeat fiberoptic exam was unremarkable hence Dr. 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  Tongue biopsy showed benign squamous mucosa with low to intermediate grade dysplasia. 05/07/2022: PET/CT showed asymmetric hypermetabolism in the left oropharynx and involving the left epiglottis.  Contralateral hypermetabolic level 2 cervical lymph nodes compatible with metastatic disease.  No hypermetabolic adenopathy in the left neck or in the chest.  1.2 cm short axis aortocaval lymph node in the upper abdomen is  hypermetabolic metastatic disease is a concern.  This lymph node has increased from 0.7 cm on the study from 2022.  No other metastatic disease. Received radiation from 05/30/2022-07/18/2022 and weekly cisplatin from 06/01/2022-07/18/2022.   INTERIM HISTORY:  Cory [name] returns for a follow up after completion of chemoradiation on 07/18/2022.   He is here for a follow up.  Since his last visit here he has lost some weight but denies any other complaints.  He denies any fevers, drenching night sweats, loss of appetite or loss of weight. Since his last visit here, no major change in his health.  He is able to swallow well.  He recently saw Dr. Jenne Pane and got a clean bill of health.  He denies any change in breathing, bowel habits or urinary habits.  No new medications.  Rest of the pertinent 10 point ROS reviewed and neg.  Wt Readings from Last 3 Encounters:  01/24/23 201 lb 12.8 oz (91.5 kg)  10/23/22 208 lb 6.4 oz (94.5 kg)  10/23/22 208 lb 4.8 oz (94.5 kg)    MEDICAL HISTORY:  Past Medical History:  Diagnosis Date   Aortic stenosis    a. Moderate by echo 05/2020.   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/2022   PAF (paroxysmal atrial fibrillation) (HCC)    Peripheral arterial disease (HCC)    Tobacco abuse     SURGICAL HISTORY: Past Surgical History:  Procedure Laterality Date   ABDOMINAL AORTIC ENDOVASCULAR STENT GRAFT N/A 08/29/2020   Procedure: ABDOMINAL AORTIC ENDOVASCULAR STENT GRAFT;  Surgeon: Sherren Kerns, MD;  Location: [facility] OR;  Service: Vascular;  Laterality: N/A;   ABDOMINAL AORTOGRAM W/LOWER EXTREMITY Bilateral 06/24/2020   Procedure: ABDOMINAL AORTOGRAM W/LOWER EXTREMITY;  Surgeon: Sherren Kerns, MD;  Location: [facility] INVASIVE CV LAB;  Service: Cardiovascular;  Laterality: Bilateral;   BACK SURGERY     2005   BIOPSY  05/21/2022    Procedure: BIOPSY;  Surgeon: Meridee Score Netty Starring., MD;  Location: [facility];  Service: Gastroenterology;;   CERVICAL SPINE SURGERY     DIRECT LARYNGOSCOPY Bilateral 04/18/2022   Procedure: DIRECT LARYNGOSCOPY WITH BIOPSIES;  Surgeon: Christia Reading, MD;  Location: [facility] OR;  Service: ENT;  Laterality: Bilateral;   EMBOLECTOMY Right 08/29/2020   Procedure: POPLITEAL AND TIBIAL EMBOLECTOMY;  Surgeon: Sherren Kerns, MD;  Location: [facility] OR;  Service: Vascular;  Laterality: Right;   EMBOLIZATION Right 06/24/2020   Procedure: EMBOLIZATION;  Surgeon: Sherren Kerns, MD;  Location: [facility] INVASIVE CV LAB;  Service: Cardiovascular;  Laterality: Right;   ENDARTERECTOMY FEMORAL Right 08/29/2020   Procedure: ENDARTERECTOMY FEMORAL;  Surgeon: Sherren Kerns, MD;  Location: [facility] OR;  Service: Vascular;  Laterality: Right;   ESOPHAGOGASTRODUODENOSCOPY N/A 06/27/2022   Procedure: ESOPHAGOGASTRODUODENOSCOPY (EGD);  Surgeon: Lemar Lofty., MD;  Location: [facility];  Service: Gastroenterology;  Laterality: N/A;   ESOPHAGOGASTRODUODENOSCOPY (EGD) WITH PROPOFOL N/A 05/21/2022   Procedure: ESOPHAGOGASTRODUODENOSCOPY (EGD) WITH PROPOFOL;  Surgeon: Meridee Score Netty Starring., MD;  Location: [facility] ENDOSCOPY;  Service: Gastroenterology;  Laterality: N/A;   EUS N/A 05/21/2022   Procedure: UPPER ENDOSCOPIC ULTRASOUND (EUS) RADIAL;  Surgeon: Lemar Lofty., MD;  Location: [facility] ENDOSCOPY;  Service: Gastroenterology;  Laterality: N/A;   EUS N/A 06/27/2022   Procedure: UPPER ENDOSCOPIC ULTRASOUND (EUS) RADIAL;  Surgeon: Lemar Lofty., MD;  Location: [facility] ENDOSCOPY;  Service: Gastroenterology;  Laterality: N/A;   EYE SURGERY     FEMORAL ARTERY EXPLORATION Right 06/25/2020   Procedure: RIGHT GROIN EXPLORATION Evacuation of Hematoma  WITH REPAIR OF RIGHT Common FEMORAL ARTERY.;  Surgeon: Chuck Hint, MD;  Location: [facility] OR;  Service: Vascular;  Laterality: Right;   FEMORAL-POPLITEAL BYPASS GRAFT  Left 11/22/2020   Procedure: LEFT ABOVE KNEE-BELOW KNEE BYPASS TIBIAL PERITONEAL TRUNK REVERSE IPSILATERAL GREATER SAPHENOUS VEIN;  Surgeon: Sherren Kerns, MD;  Location: [facility] OR;  Service: Vascular;  Laterality: Left;   FINE NEEDLE ASPIRATION N/A 05/21/2022   Procedure: FINE NEEDLE ASPIRATION (FNA) LINEAR;  Surgeon: Lemar Lofty., MD;  Location: [facility];  Service: Gastroenterology;  Laterality: N/A;   FINE NEEDLE ASPIRATION  06/27/2022   Procedure: FINE NEEDLE ASPIRATION;  Surgeon: Meridee Score Netty Starring., MD;  Location: [facility]OS[facility];  Service: Gastroenterology;;   HOT HEMOSTASIS N/A 06/27/2022   Procedure: HOT HEMOSTASIS (ARGON PLASMA COAGULATION/BICAP);  Surgeon: Lemar Lofty., MD;  Location: [facility];  Service: Gastroenterology;  Laterality: N/A;   IR GASTROSTOMY TUBE MOD SED  06/12/2022   IR GASTROSTOMY TUBE REMOVAL  09/04/2022   IR IMAGING GUIDED PORT INSERTION  06/12/2022   IR REMOVAL TUN ACCESS W/ PORT W/O FL MOD SED  11/21/2022   LEG SURGERY     Trauma   LOWER EXTREMITY ANGIOGRAPHY Left 11/17/2020   Procedure: Lower Extremity Angiography;  Surgeon: Cephus Shelling, MD;  Location: [facility] INVASIVE CV LAB;  Service: Cardiovascular;  Laterality: Left;   PATCH ANGIOPLASTY Right 08/29/2020   Procedure: PATCH ANGIOPLASTY RIGHT BELOW KNEE POPLITEAL ARTERY AND RIGHT COMMON FEMORAL ARTERY;  Surgeon: Sherren Kerns, MD;  Location: [facility] OR;  Service: Vascular;  Laterality: Right;   RETINAL DETACHMENT SURGERY     RIGID ESOPHAGOSCOPY  04/18/2022   Procedure: RIGID ESOPHAGOSCOPY;  Surgeon: Christia Reading, MD;  Location: [facility] OR;  Service: ENT;;   TONSILLECTOMY  04/18/2022   Procedure: LEFT TONSILLECTOMY;  Surgeon: Christia Reading, MD;  Location: [facility] OR;  Service: ENT;;   ULTRASOUND GUIDANCE FOR VASCULAR ACCESS Bilateral 08/29/2020   Procedure: ULTRASOUND GUIDANCE FOR VASCULAR ACCESS;  Surgeon: Sherren Kerns, MD;  Location: [facility] OR;  Service: Vascular;  Laterality: Bilateral;     SOCIAL HISTORY: Social History   Socioeconomic History   Marital status: Married    Spouse name: Not on file   Number of children: 5   Years of education: Not on file   Highest education level: Master's degree (e.g., MA, MS, MEng, MEd, MSW, MBA)  Occupational History   Not on file  Tobacco Use   Smoking status: Every Day    Packs/day: 0.50    Years: 50.00    Additional pack years: 0.00    Total pack years: 25.00    Types: Cigarettes    Passive exposure: Never   Smokeless tobacco: Never  Vaping Use   Vaping Use: Never used  Substance and Sexual Activity   Alcohol use: Yes    Alcohol/week: 0.0 standard drinks of alcohol    Comment: 4 to 5 times a week; beer, wine and liquor    Drug use: No   Sexual activity: Not Currently  Other Topics Concern   Not on file  Social History Narrative   Scores reading tests.  Lives with wife.     Social Determinants of Health   Financial Resource Strain: Low Risk  (05/24/2022)   Overall Financial Resource Strain (CARDIA)    Difficulty of Paying Living Expenses: Not hard at all  Food Insecurity: No Food Insecurity (08/01/2022)   Hunger Vital Sign    Worried About Running Out of Food in the Last Year: Never true    Ran Out of Food in the Last Year: Never true  Transportation Needs: No Transportation Needs (08/01/2022)   PRAPARE - Administrator, Civil Service (Medical): No    Lack of Transportation (Non-Medical): No  Physical Activity: Insufficiently Active (01/08/2022)   Exercise Vital Sign    Days of Exercise per Week: 3 days    Minutes of Exercise per Session: 30 min  Stress: No Stress Concern Present (01/08/2022)   [employer] of Occupational Health - Occupational Stress Questionnaire    Feeling of Stress : Not at all  Social Connections: Moderately Isolated (01/08/2022)   Social Connection and Isolation Panel [NHANES]    Frequency of Communication with Friends and Family: More than three times a week     Frequency of Social Gatherings with Friends and Family: Three times a week    Attends Religious Services: Never    Active Member of Clubs or Organizations: No    Attends Banker Meetings: Not on file    Marital Status: Married  Catering manager Violence: Not At Risk (06/07/2021)   Humiliation, Afraid, Rape, and Kick questionnaire    Fear of Current or Ex-Partner: No    Emotionally Abused: No    Physically Abused: No    Sexually Abused: No    FAMILY HISTORY: Family History  Problem Relation Age of Onset   Ovarian cancer Mother    Alcoholism Father    Breast cancer Sister    Ovarian cancer Sister     ALLERGIES:  is allergic to zithromax [azithromycin] and chlorpheniramine.  MEDICATIONS:  Current Outpatient Medications  Medication Sig Dispense Refill   acetaminophen (TYLENOL) 500 MG tablet Take 500 mg by mouth every 6 (six) hours as needed (for pain.).     cetirizine (ZYRTEC) 10 MG tablet Take 1 tablet (10 mg total) by mouth daily. (Patient taking differently: Take 10 mg by mouth daily as needed for allergies.) 30 tablet 11   cholecalciferol (VITAMIN D) 25 MCG (1000 UNIT) tablet Take 1,000 Units by mouth 2 (two) times daily.     doxycycline (VIBRAMYCIN) 100 MG capsule Take 1 capsule (100 mg total) by mouth 2 (two) times daily. 14 capsule 0   fluticasone (FLONASE) 50 MCG/ACT nasal spray Place 2 sprays into both nostrils daily. 16 g 6   lidocaine-prilocaine (EMLA) cream Apply to affected area once 30 g 3   metoprolol tartrate (LOPRESSOR) 25 MG tablet Take 1 tablet by mouth twice daily as needed 180 tablet 0   nicotine (NICODERM CQ - DOSED IN MG/24 HOURS) 21 mg/24hr patch Place 1 patch (21 mg total) onto the skin daily. Apply 21 mg patch daily x 6 wk, then 14mg  patch daily x 2 wk, then 7 mg patch daily x 2 wk 14 patch 2   omeprazole (PRILOSEC) 40 MG capsule Take 1 capsule (40 mg total) by mouth daily. 30 capsule 6   rivaroxaban (XARELTO) 20 MG TABS tablet TAKE 1 TABLET BY  MOUTH ONCE DAILY WITH SUPPER 90 tablet 1   rosuvastatin (CRESTOR) 10 MG tablet Take 1 tablet by mouth once daily 90 tablet 0   sodium chloride (OCEAN) 0.65 % SOLN nasal spray Place 1 spray into both nostrils as needed for congestion.     TIADYLT ER 120 MG 24 hr capsule TAKE 1 CAPSULE EVERY DAY (SCHEDULE OFFICE VISIT FOR FUTURE REFILLS) 90 capsule 3   No current facility-administered medications for this visit.     PHYSICAL EXAMINATION: ECOG PERFORMANCE STATUS: 1 - Symptomatic but completely ambulatory  Vitals:   01/24/23 1451  BP: 119/68  Pulse: (!) 57  Resp: 16  Temp: 97.7 F (36.5 C)  SpO2: 93%   Filed Weights   01/24/23 1451  Weight: 201 lb 12.8 oz (91.5 kg)    Physical Exam Constitutional:      Appearance: Normal appearance.  Cardiovascular:     Rate and Rhythm: Normal rate and regular rhythm.  Pulmonary:     Effort: Pulmonary effort is normal.     Breath sounds: Normal breath sounds.  Abdominal:     Comments:    Musculoskeletal:        General: No swelling or tenderness. Normal range of motion.     Cervical back: Normal range of motion and neck supple. No rigidity.  Lymphadenopathy:     Cervical: No cervical adenopathy.  Skin:    General: Skin is warm and dry.  Neurological:     General: No focal deficit present.     Mental Status: He is alert.      LABORATORY DATA:  I have reviewed the data as listed Lab Results  Component Value Date   WBC 5.3 01/24/2023   HGB 14.2 01/24/2023   HCT 41.5 01/24/2023   MCV 98.8 01/24/2023   PLT 180 01/24/2023     Chemistry      Component Value Date/Time   NA 137 01/24/2023 1518   NA 141 05/16/2018 1425   K 4.3 01/24/2023 1518   CL 104 01/24/2023 1518   CO2 30 01/24/2023 1518   BUN 18 01/24/2023 1518   BUN 13 05/16/2018 1425  CREATININE 0.97 01/24/2023 1518      Component Value Date/Time   CALCIUM 9.5 01/24/2023 1518   ALKPHOS 36 (L) 01/24/2023 1518   AST 14 (L) 01/24/2023 1518   ALT 13 01/24/2023 1518    BILITOT 0.3 01/24/2023 1518       RADIOGRAPHIC STUDIES: I have personally reviewed the radiological images as listed and agreed with the findings in the report. No results found.  ASSESSMENT AND PLAN:  Malignant neoplasm of supraglottis (HCC) This is a very pleasant 77 year old male patient with history of smoking now 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   Further investigation of the aortocaval lymph node showed atypical lymphocytes suggestive of lymphoproliferative disorder.  However upon further review, it appears that the lymph node that was biopsied is not the lymph node that was desired to be biopsied.although this was abnormal.  We have requested a second biopsy for better characterization of the diagnosis. This was inconclusive. We have hence recommended that he come back for FU in 3 months. CT chest ordered to follow up on LUL nodule.,  This has been done yesterday but pending read.  No major changes on my read.  He continues to remain clinically asymptomatic from the lymphoproliferative disorder standpoint.  He also had a recent laryngoscopy to evaluate the supraglottic tumor and had no evidence of recurrence from Dr. Jenne Pane note. We will continue to monitor him every 3 months given the underlying lymphoproliferative disorder as well.  At this time will try to order pet imaging as needed.  Will monitor with CBC, CMP, LDH and clinically. Rachel Moulds MD    I have spent a total of 30 minutes minutes of face-to-face and non-face-to-face time, preparing to see the patient, performing a medically appropriate examination, counseling and educating the patient, documenting clinical information in the electronic health record, and care coordination.

## 2023-01-25 ENCOUNTER — Encounter: Payer: Self-pay | Admitting: Hematology and Oncology

## 2023-02-13 ENCOUNTER — Other Ambulatory Visit: Payer: Self-pay | Admitting: Family Medicine

## 2023-02-25 ENCOUNTER — Ambulatory Visit (INDEPENDENT_AMBULATORY_CARE_PROVIDER_SITE_OTHER): Payer: Medicare HMO

## 2023-02-25 VITALS — Ht 75.0 in | Wt 201.0 lb

## 2023-02-25 DIAGNOSIS — Z122 Encounter for screening for malignant neoplasm of respiratory organs: Secondary | ICD-10-CM

## 2023-02-25 DIAGNOSIS — Z0000 Encounter for general adult medical examination without abnormal findings: Secondary | ICD-10-CM | POA: Diagnosis not present

## 2023-02-25 NOTE — Progress Notes (Signed)
Subjective:   Cory [name] is a 77 y.o. male who presents for Medicare Annual/Subsequent preventive examination.  Visit Complete: Virtual  I connected with  Cory [name] on 02/25/23 by a audio enabled telemedicine application and verified that I am speaking with the correct person using two identifiers.  Patient Location: Home  Provider Location: Home Office  I discussed the limitations of evaluation and management by telemedicine. The patient expressed understanding and agreed to proceed.  Patient Medicare AWV questionnaire was completed by the patient on 02/24/23; I have confirmed that all information answered by patient is correct and no changes since this date.  Review of Systems     Cardiac Risk Factors include: advanced age (>59men, >69 women);male gender;smoking/ tobacco exposure;dyslipidemia;hypertension     Objective:    Today's Vitals   02/25/23 1107  Weight: 201 lb (91.2 kg)  Height: 6\' 3"  (1.905 m)   Body mass index is 25.12 kg/m.     02/25/2023   11:16 AM 09/19/2022   11:55 AM 07/27/2022   10:59 AM 07/18/2022    8:00 AM 06/27/2022    9:46 AM 06/15/2022   10:03 AM 05/24/2022    9:36 AM  Advanced Directives  Does Patient Have a Medical Advance Directive? No No No No No Yes No  Does patient want to make changes to medical advance directive?   No - Patient declined No - Patient declined  No - Patient declined   Would patient like information on creating a medical advance directive? No - Patient declined No - Patient declined     No - Patient declined    Current Medications (verified) Outpatient Encounter Medications as of 02/25/2023  Medication Sig   acetaminophen (TYLENOL) 500 MG tablet Take 500 mg by mouth every 6 (six) hours as needed (for pain.).   cetirizine (ZYRTEC) 10 MG tablet Take 1 tablet (10 mg total) by mouth daily. (Patient taking differently: Take 10 mg by mouth daily as needed for allergies.)   cholecalciferol (VITAMIN D) 25 MCG (1000 UNIT)  tablet Take 1,000 Units by mouth 2 (two) times daily.   doxycycline (VIBRAMYCIN) 100 MG capsule Take 1 capsule (100 mg total) by mouth 2 (two) times daily.   fluticasone (FLONASE) 50 MCG/ACT nasal spray Place 2 sprays into both nostrils daily.   lidocaine-prilocaine (EMLA) cream Apply to affected area once   metoprolol tartrate (LOPRESSOR) 25 MG tablet Take 1 tablet by mouth twice daily as needed   nicotine (NICODERM CQ - DOSED IN MG/24 HOURS) 21 mg/24hr patch Place 1 patch (21 mg total) onto the skin daily. Apply 21 mg patch daily x 6 wk, then 14mg  patch daily x 2 wk, then 7 mg patch daily x 2 wk   omeprazole (PRILOSEC) 40 MG capsule Take 1 capsule (40 mg total) by mouth daily.   rivaroxaban (XARELTO) 20 MG TABS tablet TAKE 1 TABLET BY MOUTH ONCE DAILY WITH SUPPER   rosuvastatin (CRESTOR) 10 MG tablet TAKE 1 TABLET BY MOUTH ONCE DAILY **PATIENT  NEEDS  AN  APPT**   sodium chloride (OCEAN) 0.65 % SOLN nasal spray Place 1 spray into both nostrils as needed for congestion.   TIADYLT ER 120 MG 24 hr capsule TAKE 1 CAPSULE EVERY DAY (SCHEDULE OFFICE VISIT FOR FUTURE REFILLS)   No facility-administered encounter medications on file as of 02/25/2023.    Allergies (verified) Zithromax [azithromycin] and Chlorpheniramine   History: Past Medical History:  Diagnosis Date   Aortic stenosis    a. Moderate  by echo 05/2020.   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/2022   PAF (paroxysmal atrial fibrillation) (HCC)    Peripheral arterial disease (HCC)    Tobacco abuse    Past Surgical History:  Procedure Laterality Date   ABDOMINAL AORTIC ENDOVASCULAR STENT GRAFT N/A 08/29/2020   Procedure: ABDOMINAL AORTIC ENDOVASCULAR STENT GRAFT;  Surgeon: Sherren Kerns, MD;  Location: [facility] OR;  Service: Vascular;  Laterality: N/A;   ABDOMINAL AORTOGRAM W/LOWER EXTREMITY Bilateral  06/24/2020   Procedure: ABDOMINAL AORTOGRAM W/LOWER EXTREMITY;  Surgeon: Sherren Kerns, MD;  Location: [facility] INVASIVE CV LAB;  Service: Cardiovascular;  Laterality: Bilateral;   BACK SURGERY     2005   BIOPSY  05/21/2022   Procedure: BIOPSY;  Surgeon: Meridee Score Netty Starring., MD;  Location: [facility];  Service: Gastroenterology;;   CERVICAL SPINE SURGERY     DIRECT LARYNGOSCOPY Bilateral 04/18/2022   Procedure: DIRECT LARYNGOSCOPY WITH BIOPSIES;  Surgeon: Christia Reading, MD;  Location: [facility] OR;  Service: ENT;  Laterality: Bilateral;   EMBOLECTOMY Right 08/29/2020   Procedure: POPLITEAL AND TIBIAL EMBOLECTOMY;  Surgeon: Sherren Kerns, MD;  Location: [facility] OR;  Service: Vascular;  Laterality: Right;   EMBOLIZATION Right 06/24/2020   Procedure: EMBOLIZATION;  Surgeon: Sherren Kerns, MD;  Location: [facility] INVASIVE CV LAB;  Service: Cardiovascular;  Laterality: Right;   ENDARTERECTOMY FEMORAL Right 08/29/2020   Procedure: ENDARTERECTOMY FEMORAL;  Surgeon: Sherren Kerns, MD;  Location: [facility] OR;  Service: Vascular;  Laterality: Right;   ESOPHAGOGASTRODUODENOSCOPY N/A 06/27/2022   Procedure: ESOPHAGOGASTRODUODENOSCOPY (EGD);  Surgeon: Lemar Lofty., MD;  Location: [facility];  Service: Gastroenterology;  Laterality: N/A;   ESOPHAGOGASTRODUODENOSCOPY (EGD) WITH PROPOFOL N/A 05/21/2022   Procedure: ESOPHAGOGASTRODUODENOSCOPY (EGD) WITH PROPOFOL;  Surgeon: Meridee Score Netty Starring., MD;  Location: [facility] ENDOSCOPY;  Service: Gastroenterology;  Laterality: N/A;   EUS N/A 05/21/2022   Procedure: UPPER ENDOSCOPIC ULTRASOUND (EUS) RADIAL;  Surgeon: Lemar Lofty., MD;  Location: [facility] ENDOSCOPY;  Service: Gastroenterology;  Laterality: N/A;   EUS N/A 06/27/2022   Procedure: UPPER ENDOSCOPIC ULTRASOUND (EUS) RADIAL;  Surgeon: Lemar Lofty., MD;  Location: [facility];  Service: Gastroenterology;  Laterality: N/A;   EYE SURGERY     FEMORAL ARTERY EXPLORATION Right 06/25/2020    Procedure: RIGHT GROIN EXPLORATION Evacuation of Hematoma  WITH REPAIR OF RIGHT Common FEMORAL ARTERY.;  Surgeon: Chuck Hint, MD;  Location: [facility] OR;  Service: Vascular;  Laterality: Right;   FEMORAL-POPLITEAL BYPASS GRAFT Left 11/22/2020   Procedure: LEFT ABOVE KNEE-BELOW KNEE BYPASS TIBIAL PERITONEAL TRUNK REVERSE IPSILATERAL GREATER SAPHENOUS VEIN;  Surgeon: Sherren Kerns, MD;  Location: [facility] OR;  Service: Vascular;  Laterality: Left;   FINE NEEDLE ASPIRATION N/A 05/21/2022   Procedure: FINE NEEDLE ASPIRATION (FNA) LINEAR;  Surgeon: Lemar Lofty., MD;  Location: [facility];  Service: Gastroenterology;  Laterality: N/A;   FINE NEEDLE ASPIRATION  06/27/2022   Procedure: FINE NEEDLE ASPIRATION;  Surgeon: Meridee Score Netty Starring., MD;  Location: [facility];  Service: Gastroenterology;;   HOT HEMOSTASIS N/A 06/27/2022   Procedure: HOT HEMOSTASIS (ARGON PLASMA COAGULATION/BICAP);  Surgeon: Lemar Lofty., MD;  Location: [facility];  Service: Gastroenterology;  Laterality: N/A;   IR GASTROSTOMY TUBE MOD SED  06/12/2022   IR GASTROSTOMY TUBE REMOVAL  09/04/2022   IR IMAGING GUIDED PORT INSERTION  06/12/2022  IR REMOVAL TUN ACCESS W/ PORT W/O FL MOD SED  11/21/2022   LEG SURGERY     Trauma   LOWER EXTREMITY ANGIOGRAPHY Left 11/17/2020   Procedure: Lower Extremity Angiography;  Surgeon: Cephus Shelling, MD;  Location: [facility] INVASIVE CV LAB;  Service: Cardiovascular;  Laterality: Left;   PATCH ANGIOPLASTY Right 08/29/2020   Procedure: PATCH ANGIOPLASTY RIGHT BELOW KNEE POPLITEAL ARTERY AND RIGHT COMMON FEMORAL ARTERY;  Surgeon: Sherren Kerns, MD;  Location: [facility] OR;  Service: Vascular;  Laterality: Right;   RETINAL DETACHMENT SURGERY     RIGID ESOPHAGOSCOPY  04/18/2022   Procedure: RIGID ESOPHAGOSCOPY;  Surgeon: Christia Reading, MD;  Location: [facility] OR;  Service: ENT;;   TONSILLECTOMY  04/18/2022   Procedure: LEFT TONSILLECTOMY;  Surgeon: Christia Reading, MD;  Location: [facility] OR;   Service: ENT;;   ULTRASOUND GUIDANCE FOR VASCULAR ACCESS Bilateral 08/29/2020   Procedure: ULTRASOUND GUIDANCE FOR VASCULAR ACCESS;  Surgeon: Sherren Kerns, MD;  Location: [facility] OR;  Service: Vascular;  Laterality: Bilateral;   Family History  Problem Relation Age of Onset   Ovarian cancer Mother    Alcoholism Father    Breast cancer Sister    Ovarian cancer Sister    Social History   Socioeconomic History   Marital status: Married    Spouse name: Not on file   Number of children: 5   Years of education: Not on file   Highest education level: Master's degree (e.g., MA, MS, MEng, MEd, MSW, MBA)  Occupational History   Not on file  Tobacco Use   Smoking status: Every Day    Current packs/day: 0.50    Average packs/day: 0.5 packs/day for 50.0 years (25.0 ttl pk-yrs)    Types: Cigarettes    Passive exposure: Never   Smokeless tobacco: Never  Vaping Use   Vaping status: Never Used  Substance and Sexual Activity   Alcohol use: Yes    Alcohol/week: 0.0 standard drinks of alcohol    Comment: 4 to 5 times a week; beer, wine and liquor    Drug use: No   Sexual activity: Not Currently  Other Topics Concern   Not on file  Social History Narrative   Scores reading tests.  Lives with wife.     Social Determinants of Health   Financial Resource Strain: Low Risk  (02/25/2023)   Overall Financial Resource Strain (CARDIA)    Difficulty of Paying Living Expenses: Not hard at all  Food Insecurity: No Food Insecurity (02/25/2023)   Hunger Vital Sign    Worried About Running Out of Food in the Last Year: Never true    Ran Out of Food in the Last Year: Never true  Transportation Needs: No Transportation Needs (02/25/2023)   PRAPARE - Administrator, Civil Service (Medical): No    Lack of Transportation (Non-Medical): No  Physical Activity: Sufficiently Active (02/25/2023)   Exercise Vital Sign    Days of Exercise per Week: 5 days    Minutes of Exercise per Session: 30 min   Stress: No Stress Concern Present (02/25/2023)   [employer] of Occupational Health - Occupational Stress Questionnaire    Feeling of Stress : Not at all  Social Connections: Moderately Isolated (02/25/2023)   Social Connection and Isolation Panel [NHANES]    Frequency of Communication with Friends and Family: More than three times a week    Frequency of Social Gatherings with Friends and Family: More than three times a week    Attends  Religious Services: Never    Active Member of Clubs or Organizations: No    Attends Banker Meetings: Never    Marital Status: Married    Tobacco Counseling Ready to quit: Yes Counseling given: Yes   Clinical Intake:  Pre-visit preparation completed: Yes  Pain : No/denies pain     BMI - recorded: 25.12 Nutritional Status: BMI 25 -29 Overweight Nutritional Risks: None Diabetes: No  How often do you need to have someone help you when you read instructions, pamphlets, or other written materials from your doctor or pharmacy?: 1 - Never  Interpreter Needed?: No  Information entered by :: Theresa Mulligan LPN   Activities of Daily Living    02/25/2023   11:15 AM 02/24/2023    6:27 PM  In your present state of health, do you have any difficulty performing the following activities:  Hearing? 0 0  Vision? 0 0  Difficulty concentrating or making decisions? 0 0  Walking or climbing stairs? 0 0  Dressing or bathing? 0 0  Doing errands, shopping? 0 0  Preparing Food and eating ? N N  Using the Toilet? N N  In the past six months, have you accidently leaked urine? N N  Do you have problems with loss of bowel control? N N  Managing your Medications? N N  Managing your Finances? N N  Housekeeping or managing your Housekeeping? N N    Patient Care Team: Kristian Covey, MD as PCP - General Rollene Rotunda, MD as PCP - Cardiology (Cardiology) Verner Chol, [facility] (Inactive) as Pharmacist (Pharmacist) Malmfelt, Lise Auer, RN as Oncology Nurse Navigator Lonie Peak, MD as Consulting Physician (Radiation Oncology) Rachel Moulds, MD as Consulting Physician (Hematology and Oncology)  Indicate any recent Medical Services you may have received from other than [facility] providers in the past year (date may be approximate).     Assessment:   This is a routine wellness examination for Cory [name].  Hearing/Vision screen Hearing Screening - Comments:: Denies hearing difficulties   Vision Screening - Comments:: Wears rx glasses - up to date with routine eye exams with  [facility]  Dietary issues and exercise activities discussed:     Goals Addressed               This Visit's Progress     Stay Healthy (pt-stated)         Depression Screen    02/25/2023   11:14 AM 12/01/2021    3:05 PM 06/07/2021    1:12 PM 06/07/2021    1:10 PM 03/16/2021   11:06 AM 03/01/2021    9:46 AM 08/02/2017   12:09 PM  PHQ 2/9 Scores  PHQ - 2 Score 0 0 0 0 0 0 0  PHQ- 9 Score  0         Fall Risk    02/25/2023   11:15 AM 02/24/2023    6:27 PM 01/08/2022   12:54 PM 12/01/2021    3:05 PM 06/07/2021    1:12 PM  Fall Risk   Falls in the past year? 0 0 0 1 0  Number falls in past yr: 0   0 0  Injury with Fall? 0   1 0  Risk for fall due to : No Fall Risks    History of fall(s)  Follow up Falls prevention discussed        MEDICARE RISK AT HOME:  Medicare Risk at Home - 02/25/23 1122  Any stairs in or around the home? Yes    If so, are there any without handrails? No    Home free of loose throw rugs in walkways, pet beds, electrical cords, etc? Yes    Adequate lighting in your home to reduce risk of falls? Yes    Life alert? No    Use of a cane, walker or w/c? No    Grab bars in the bathroom? Yes    Shower chair or bench in shower? Yes    Elevated toilet seat or a handicapped toilet? Yes             TIMED UP AND GO:  Was the test performed?  No    Cognitive Function:        02/25/2023   11:16 AM   6CIT Screen  What Year? 0 points  What month? 0 points  What time? 0 points  Count back from 20 0 points  Months in reverse 0 points  Repeat phrase 0 points  Total Score 0 points    Immunizations Immunization History  Administered Date(s) Administered   Fluad Quad(high Dose 65+) 06/27/2020, 04/26/2021   Influenza Whole 07/14/2009   Influenza, High Dose Seasonal PF 08/16/2016, 06/05/2017, 04/17/2022   Influenza,inj,Quad PF,6+ Mos 08/09/2014, 04/29/2018   Influenza-Unspecified 09/23/2015   PFIZER(Purple Top)SARS-COV-2 Vaccination 08/28/2019, 09/18/2019   Pneumococcal Conjugate-13 06/05/2017   Pneumococcal Polysaccharide-23 07/14/2009, 04/01/2013   Td 08/07/2003   Zoster Recombinant(Shingrix) 04/17/2022    TDAP status: Due, Education has been provided regarding the importance of this vaccine. Advised may receive this vaccine at local pharmacy or Health Dept. Aware to provide a copy of the vaccination record if obtained from local pharmacy or Health Dept. Verbalized acceptance and understanding.  Flu Vaccine status: Up to date  Pneumococcal vaccine status: Up to date  Covid-19 vaccine status: Completed vaccines  Qualifies for Shingles Vaccine? Yes   Zostavax completed No   Shingrix Completed?: No.    Education has been provided regarding the importance of this vaccine. Patient has been advised to call insurance company to determine out of pocket expense if they have not yet received this vaccine. Advised may also receive vaccine at local pharmacy or Health Dept. Verbalized acceptance and understanding.  Screening Tests Health Maintenance  Topic Date Due   DTaP/Tdap/Td (2 - Tdap) 08/06/2013   Zoster Vaccines- Shingrix (2 of 2) 05/28/2023 (Originally 06/12/2022)   Hepatitis C Screening  02/25/2024 (Originally 02/12/1964)   INFLUENZA VACCINE  03/07/2023   Lung Cancer Screening  01/23/2024   Medicare Annual Wellness (AWV)  02/25/2024   Pneumonia Vaccine 48+ Years old  Completed    HPV VACCINES  Aged Out   COVID-19 Vaccine  Discontinued    Health Maintenance  Health Maintenance Due  Topic Date Due   DTaP/Tdap/Td (2 - Tdap) 08/06/2013    Colorectal cancer screening: No longer required.   Lung Cancer Screening: (Low Dose CT Chest recommended if Age 24-80 years, 20 pack-year currently smoking OR have quit w/in 15years.) does qualify.   Lung Cancer Screening Referral: 02/25/23  Additional Screening:  Hepatitis C Screening: does qualify; Deferred  Vision Screening: Recommended annual ophthalmology exams for early detection of glaucoma and other disorders of the eye. Is the patient up to date with their annual eye exam?  Yes  Who is the provider or what is the name of the office in which the patient attends annual eye exams? [facility] If pt is not established with a provider, would they like  to be referred to a provider to establish care? No .   Dental Screening: Recommended annual dental exams for proper oral hygiene    Community Resource Referral / Chronic Care Management:  CRR required this visit?  No   CCM required this visit?  No     Plan:     I have personally reviewed and noted the following in the patient's chart:   Medical and social history Use of alcohol, tobacco or illicit drugs  Current medications and supplements including opioid prescriptions. Patient is not currently taking opioid prescriptions. Functional ability and status Nutritional status Physical activity Advanced directives List of other physicians Hospitalizations, surgeries, and ER visits in previous 12 months Vitals Screenings to include cognitive, depression, and falls Referrals and appointments  In addition, I have reviewed and discussed with patient certain preventive protocols, quality metrics, and best practice recommendations. A written personalized care plan for preventive services as well as general preventive health recommendations were provided to  patient.     Tillie Rung, LPN   0/05/2724   After Visit Summary: (Mail) Due to this being a telephonic visit, the after visit summary with patients personalized plan was offered to patient via mail   Nurse Notes: Patient due Hep-C Screening

## 2023-02-25 NOTE — Patient Instructions (Addendum)
Cory [name] , Thank you for taking time to come for your Medicare Wellness Visit. I appreciate your ongoing commitment to your health goals. Please review the following plan we discussed and let me know if I can assist you in the future.   These are the goals we discussed:  Goals       Quit smoking / using tobacco      Smoking;  Educated to avoid secondary smoke Smoking cessation in [place]: [employer] Line at [phone] -day Smoking cessation in [place]: Evening; [phone] Smoking cessation at [facility]: [phone] Smoking cessation at [facility]: [phone] Classes offered a couple of times a month;  Will work with the patient as far as registration and location  Meds may help; chatix (Varenicline); Zyban (Bupropion SR); Nicotine Replacement (gum; lozenges; patches; etc.)   30 pack yr smoking hx: Educated regarding LDCT; To discuss with MD at next fup.  [place] helpline to help you quit  1-800-QUIT-NOW 340-771-6618).        Stay Healthy (pt-stated)        This is a list of the screening recommended for you and due dates:  Health Maintenance  Topic Date Due   DTaP/Tdap/Td vaccine (2 - Tdap) 08/06/2013   Zoster (Shingles) Vaccine (2 of 2) 05/28/2023*   Hepatitis C Screening  02/25/2024*   Flu Shot  03/07/2023   Screening for Lung Cancer  01/23/2024   Medicare Annual Wellness Visit  02/25/2024   Pneumonia Vaccine  Completed   HPV Vaccine  Aged Out   COVID-19 Vaccine  Discontinued  *Topic was postponed. The date shown is not the original due date.    Advanced directives: Advance directive discussed with you today. Even though you declined this today, please call our office should you change your mind, and we can give you the proper paperwork for you to fill out.   Conditions/risks identified: None  Next appointment: Follow up in one year for your annual wellness visit.   Preventive Care 79 Years and Older, Male  Preventive care refers to lifestyle choices and  visits with your health care provider that can promote health and wellness. What does preventive care include? A yearly physical exam. This is also called an annual well check. Dental exams once or twice a year. Routine eye exams. Ask your health care provider how often you should have your eyes checked. Personal lifestyle choices, including: Daily care of your teeth and gums. Regular physical activity. Eating a healthy diet. Avoiding tobacco and drug use. Limiting alcohol use. Practicing safe sex. Taking low doses of aspirin every day. Taking vitamin and mineral supplements as recommended by your health care provider. What happens during an annual well check? The services and screenings done by your health care provider during your annual well check will depend on your age, overall health, lifestyle risk factors, and family history of disease. Counseling  Your health care provider may ask you questions about your: Alcohol use. Tobacco use. Drug use. Emotional well-being. Home and relationship well-being. Sexual activity. Eating habits. History of falls. Memory and ability to understand (cognition). Work and work Astronomer. Screening  You may have the following tests or measurements: Height, weight, and BMI. Blood pressure. Lipid and cholesterol levels. These may be checked every 5 years, or more frequently if you are over 58 years old. Skin check. Lung cancer screening. You may have this screening every year starting at age 17 if you have a 30-pack-year history of smoking and currently smoke or have quit within  the past 15 years. Fecal occult blood test (FOBT) of the stool. You may have this test every year starting at age 64. Flexible sigmoidoscopy or colonoscopy. You may have a sigmoidoscopy every 5 years or a colonoscopy every 10 years starting at age 58. Prostate cancer screening. Recommendations will vary depending on your family history and other risks. Hepatitis C  blood test. Hepatitis B blood test. Sexually transmitted disease (STD) testing. Diabetes screening. This is done by checking your blood sugar (glucose) after you have not eaten for a while (fasting). You may have this done every 1-3 years. Abdominal aortic aneurysm (AAA) screening. You may need this if you are a current or former smoker. Osteoporosis. You may be screened starting at age 43 if you are at high risk. Talk with your health care provider about your test results, treatment options, and if necessary, the need for more tests. Vaccines  Your health care provider may recommend certain vaccines, such as: Influenza vaccine. This is recommended every year. Tetanus, diphtheria, and acellular pertussis (Tdap, Td) vaccine. You may need a Td booster every 10 years. Zoster vaccine. You may need this after age 70. Pneumococcal 13-valent conjugate (PCV13) vaccine. One dose is recommended after age 61. Pneumococcal polysaccharide (PPSV23) vaccine. One dose is recommended after age 56. Talk to your health care provider about which screenings and vaccines you need and how often you need them. This information is not intended to replace advice given to you by your health care provider. Make sure you discuss any questions you have with your health care provider. Document Released: 08/19/2015 Document Revised: 04/11/2016 Document Reviewed: 05/24/2015 Elsevier Interactive Patient Education  2017 [employer].  Fall Prevention in the Home Falls can cause injuries. They can happen to people of all ages. There are many things you can do to make your home safe and to help prevent falls. What can I do on the outside of my home? Regularly fix the edges of walkways and driveways and fix any cracks. Remove anything that might make you trip as you walk through a door, such as a raised step or threshold. Trim any bushes or trees on the path to your home. Use bright outdoor lighting. Clear any walking paths of  anything that might make someone trip, such as rocks or tools. Regularly check to see if handrails are loose or broken. Make sure that both sides of any steps have handrails. Any raised decks and porches should have guardrails on the edges. Have any leaves, snow, or ice cleared regularly. Use sand or salt on walking paths during winter. Clean up any spills in your garage right away. This includes oil or grease spills. What can I do in the bathroom? Use night lights. Install grab bars by the toilet and in the tub and shower. Do not use towel bars as grab bars. Use non-skid mats or decals in the tub or shower. If you need to sit down in the shower, use a plastic, non-slip stool. Keep the floor dry. Clean up any water that spills on the floor as soon as it happens. Remove soap buildup in the tub or shower regularly. Attach bath mats securely with double-sided non-slip rug tape. Do not have throw rugs and other things on the floor that can make you trip. What can I do in the bedroom? Use night lights. Make sure that you have a light by your bed that is easy to reach. Do not use any sheets or blankets that are too big  for your bed. They should not hang down onto the floor. Have a firm chair that has side arms. You can use this for support while you get dressed. Do not have throw rugs and other things on the floor that can make you trip. What can I do in the kitchen? Clean up any spills right away. Avoid walking on wet floors. Keep items that you use a lot in easy-to-reach places. If you need to reach something above you, use a strong step stool that has a grab bar. Keep electrical cords out of the way. Do not use floor polish or wax that makes floors slippery. If you must use wax, use non-skid floor wax. Do not have throw rugs and other things on the floor that can make you trip. What can I do with my stairs? Do not leave any items on the stairs. Make sure that there are handrails on both  sides of the stairs and use them. Fix handrails that are broken or loose. Make sure that handrails are as long as the stairways. Check any carpeting to make sure that it is firmly attached to the stairs. Fix any carpet that is loose or worn. Avoid having throw rugs at the top or bottom of the stairs. If you do have throw rugs, attach them to the floor with carpet tape. Make sure that you have a light switch at the top of the stairs and the bottom of the stairs. If you do not have them, ask someone to add them for you. What else can I do to help prevent falls? Wear shoes that: Do not have high heels. Have rubber bottoms. Are comfortable and fit you well. Are closed at the toe. Do not wear sandals. If you use a stepladder: Make sure that it is fully opened. Do not climb a closed stepladder. Make sure that both sides of the stepladder are locked into place. Ask someone to hold it for you, if possible. Clearly mark and make sure that you can see: Any grab bars or handrails. First and last steps. Where the edge of each step is. Use tools that help you move around (mobility aids) if they are needed. These include: Canes. Walkers. Scooters. Crutches. Turn on the lights when you go into a dark area. Replace any light bulbs as soon as they burn out. Set up your furniture so you have a clear path. Avoid moving your furniture around. If any of your floors are uneven, fix them. If there are any pets around you, be aware of where they are. Review your medicines with your doctor. Some medicines can make you feel dizzy. This can increase your chance of falling. Ask your doctor what other things that you can do to help prevent falls. This information is not intended to replace advice given to you by your health care provider. Make sure you discuss any questions you have with your health care provider. Document Released: 05/19/2009 Document Revised: 12/29/2015 Document Reviewed: 08/27/2014 Elsevier  Interactive Patient Education  2017 [employer].

## 2023-02-27 ENCOUNTER — Other Ambulatory Visit: Payer: Self-pay

## 2023-03-18 ENCOUNTER — Other Ambulatory Visit: Payer: Self-pay | Admitting: Family Medicine

## 2023-03-28 ENCOUNTER — Other Ambulatory Visit: Payer: Self-pay | Admitting: Cardiology

## 2023-03-28 ENCOUNTER — Other Ambulatory Visit: Payer: Self-pay | Admitting: Family Medicine

## 2023-04-15 ENCOUNTER — Other Ambulatory Visit: Payer: Self-pay | Admitting: Cardiology

## 2023-04-15 DIAGNOSIS — I4891 Unspecified atrial fibrillation: Secondary | ICD-10-CM

## 2023-04-15 NOTE — Telephone Encounter (Signed)
Prescription refill request for Xarelto received.  Indication: Afib  Last office visit: 07/12/22 (Hochrein)  Weight: 91.2kg Age: 77 Scr: 0.97 (01/24/23)  CrCl: 97.88ml/min  Appropriate dose. Refill sent.

## 2023-04-26 ENCOUNTER — Other Ambulatory Visit: Payer: Self-pay | Admitting: Family Medicine

## 2023-04-29 ENCOUNTER — Other Ambulatory Visit: Payer: Self-pay | Admitting: Family Medicine

## 2023-04-30 DIAGNOSIS — Z08 Encounter for follow-up examination after completed treatment for malignant neoplasm: Secondary | ICD-10-CM | POA: Diagnosis not present

## 2023-04-30 DIAGNOSIS — Z8521 Personal history of malignant neoplasm of larynx: Secondary | ICD-10-CM | POA: Diagnosis not present

## 2023-05-07 ENCOUNTER — Telehealth: Payer: Self-pay | Admitting: Family Medicine

## 2023-05-07 ENCOUNTER — Encounter: Payer: Self-pay | Admitting: Family Medicine

## 2023-05-07 ENCOUNTER — Ambulatory Visit (HOSPITAL_COMMUNITY): Payer: Medicare HMO

## 2023-05-07 ENCOUNTER — Ambulatory Visit (INDEPENDENT_AMBULATORY_CARE_PROVIDER_SITE_OTHER): Payer: Medicare HMO | Admitting: Family Medicine

## 2023-05-07 VITALS — BP 100/60 | HR 56 | Temp 97.8°F | Wt 195.8 lb

## 2023-05-07 DIAGNOSIS — R6889 Other general symptoms and signs: Secondary | ICD-10-CM

## 2023-05-07 DIAGNOSIS — J40 Bronchitis, not specified as acute or chronic: Secondary | ICD-10-CM | POA: Diagnosis not present

## 2023-05-07 LAB — POCT INFLUENZA A/B
Influenza A, POC: NEGATIVE
Influenza B, POC: NEGATIVE

## 2023-05-07 LAB — POCT RAPID STREP A (OFFICE): Rapid Strep A Screen: NEGATIVE

## 2023-05-07 LAB — POC COVID19 BINAXNOW: SARS Coronavirus 2 Ag: NEGATIVE

## 2023-05-07 MED ORDER — AMOXICILLIN-POT CLAVULANATE 875-125 MG PO TABS: 1.0000 | ORAL_TABLET | Freq: Two times a day (BID) | ORAL | 0 refills | Status: DC

## 2023-05-07 NOTE — Addendum Note (Signed)
Addended by: Philipp Deputy A on: 05/07/2023 01:55 PM   Modules accepted: Orders

## 2023-05-07 NOTE — Progress Notes (Signed)
Subjective:    Patient ID: Cory [name], male    DOB: [birth year], 77 y.o.   MRN: [id number]  HPI Here for 6 days of symptoms that began with fever, body aches, and diarrhea. These went away and now he has chest congestion, mild SOB, and coughing up yellow sputum. He is taking Mucinex DM. His wife had similar symptoms last week and was diagnosed with a Covid infection.    Review of Systems  Constitutional: Negative.   HENT:  Positive for congestion. Negative for ear pain, postnasal drip, sinus pressure and sore throat.   Eyes: Negative.   Respiratory:  Positive for cough, chest tightness and shortness of breath. Negative for wheezing.        Objective:   Physical Exam Constitutional:      Appearance: Normal appearance.  HENT:     Right Ear: Tympanic membrane, ear canal and external ear normal.     Left Ear: Tympanic membrane, ear canal and external ear normal.     Nose: Nose normal.     Mouth/Throat:     Pharynx: Oropharynx is clear.  Eyes:     Conjunctiva/sclera: Conjunctivae normal.  Pulmonary:     Effort: Pulmonary effort is normal.     Breath sounds: Rhonchi present. No wheezing or rales.  Lymphadenopathy:     Cervical: No cervical adenopathy.  Neurological:     Mental Status: He is alert.           Assessment & Plan:  He has a bronchitis, likely on top of a viral illness. Treat with 10 days of Augmentin.  Gershon Crane, MD

## 2023-05-07 NOTE — Telephone Encounter (Signed)
[facility] on [address] doesn't have the prescription that Dr. Clent Ridges called in in stock.  Patient would like his prescription called in to [facility] on [address].

## 2023-05-07 NOTE — Telephone Encounter (Signed)
Medication sent to pharmacy, patient is aware

## 2023-05-21 ENCOUNTER — Ambulatory Visit (HOSPITAL_COMMUNITY): Payer: Medicare HMO | Attending: Cardiology

## 2023-05-21 DIAGNOSIS — I350 Nonrheumatic aortic (valve) stenosis: Secondary | ICD-10-CM | POA: Insufficient documentation

## 2023-05-21 LAB — ECHOCARDIOGRAM COMPLETE
AR max vel: 1.71 cm2
AV Area VTI: 1.73 cm2
AV Area mean vel: 1.66 cm2
AV Mean grad: 28 mm[Hg]
AV Peak grad: 47.2 mm[Hg]
Ao pk vel: 3.44 m/s
Area-P 1/2: 2.46 cm2
P 1/2 time: 566 ms
S' Lateral: 2.5 cm

## 2023-05-22 ENCOUNTER — Encounter: Payer: Self-pay | Admitting: Cardiology

## 2023-05-23 ENCOUNTER — Telehealth: Payer: Self-pay | Admitting: Family Medicine

## 2023-05-23 MED ORDER — RIVAROXABAN 20 MG PO TABS: 20.0000 mg | ORAL_TABLET | Freq: Every day | ORAL | Status: DC

## 2023-05-23 NOTE — Telephone Encounter (Signed)
2 weeks worth of Xarelto 20 mg samples at front desk for pt to pick up

## 2023-05-23 NOTE — Telephone Encounter (Signed)
Pt call and want to know if dr.Burchette have any samples of     rivaroxaban (XARELTO) 20 MG TABS tablet because he is in a donut hole and they cost 400.00 also pt want a call back.

## 2023-05-23 NOTE — Addendum Note (Signed)
Addended by: Scheryl Marten on: 05/23/2023 06:18 PM   Modules accepted: Orders

## 2023-05-24 MED ORDER — RIVAROXABAN 20 MG PO TABS: 20.0000 mg | ORAL_TABLET | Freq: Every day | ORAL | Status: DC

## 2023-05-24 NOTE — Telephone Encounter (Signed)
Samples provided and placed in front office for pickup. Patient is aware.

## 2023-05-24 NOTE — Addendum Note (Signed)
Addended by: Christy Sartorius on: 05/24/2023 10:52 AM   Modules accepted: Orders

## 2023-05-26 ENCOUNTER — Other Ambulatory Visit: Payer: Self-pay | Admitting: Family Medicine

## 2023-05-27 ENCOUNTER — Inpatient Hospital Stay: Payer: Medicare HMO

## 2023-05-27 ENCOUNTER — Inpatient Hospital Stay: Payer: Medicare HMO | Attending: Radiation Oncology | Admitting: Hematology and Oncology

## 2023-05-27 ENCOUNTER — Other Ambulatory Visit: Payer: Self-pay

## 2023-05-27 VITALS — BP 111/61 | HR 65 | Temp 97.5°F | Resp 16 | Wt 197.9 lb

## 2023-05-27 DIAGNOSIS — Z85828 Personal history of other malignant neoplasm of skin: Secondary | ICD-10-CM | POA: Insufficient documentation

## 2023-05-27 DIAGNOSIS — I739 Peripheral vascular disease, unspecified: Secondary | ICD-10-CM | POA: Diagnosis not present

## 2023-05-27 DIAGNOSIS — Z7901 Long term (current) use of anticoagulants: Secondary | ICD-10-CM | POA: Insufficient documentation

## 2023-05-27 DIAGNOSIS — C321 Malignant neoplasm of supraglottis: Secondary | ICD-10-CM | POA: Diagnosis not present

## 2023-05-27 DIAGNOSIS — Z9089 Acquired absence of other organs: Secondary | ICD-10-CM | POA: Diagnosis not present

## 2023-05-27 DIAGNOSIS — Z803 Family history of malignant neoplasm of breast: Secondary | ICD-10-CM | POA: Insufficient documentation

## 2023-05-27 DIAGNOSIS — Z8041 Family history of malignant neoplasm of ovary: Secondary | ICD-10-CM | POA: Diagnosis not present

## 2023-05-27 DIAGNOSIS — R194 Change in bowel habit: Secondary | ICD-10-CM | POA: Diagnosis not present

## 2023-05-27 DIAGNOSIS — Z881 Allergy status to other antibiotic agents status: Secondary | ICD-10-CM | POA: Insufficient documentation

## 2023-05-27 DIAGNOSIS — F17210 Nicotine dependence, cigarettes, uncomplicated: Secondary | ICD-10-CM | POA: Diagnosis not present

## 2023-05-27 DIAGNOSIS — I350 Nonrheumatic aortic (valve) stenosis: Secondary | ICD-10-CM | POA: Diagnosis not present

## 2023-05-27 DIAGNOSIS — I480 Paroxysmal atrial fibrillation: Secondary | ICD-10-CM | POA: Insufficient documentation

## 2023-05-27 DIAGNOSIS — Z811 Family history of alcohol abuse and dependence: Secondary | ICD-10-CM | POA: Diagnosis not present

## 2023-05-27 DIAGNOSIS — Z79899 Other long term (current) drug therapy: Secondary | ICD-10-CM | POA: Diagnosis not present

## 2023-05-27 DIAGNOSIS — R591 Generalized enlarged lymph nodes: Secondary | ICD-10-CM | POA: Insufficient documentation

## 2023-05-27 DIAGNOSIS — D479 Neoplasm of uncertain behavior of lymphoid, hematopoietic and related tissue, unspecified: Secondary | ICD-10-CM

## 2023-05-27 LAB — CBC WITH DIFFERENTIAL/PLATELET
Abs Immature Granulocytes: 0.02 10*3/uL (ref 0.00–0.07)
Basophils Absolute: 0.1 10*3/uL (ref 0.0–0.1)
Basophils Relative: 1 %
Eosinophils Absolute: 0.3 10*3/uL (ref 0.0–0.5)
Eosinophils Relative: 5 %
HCT: 43.3 % (ref 39.0–52.0)
Hemoglobin: 14.6 g/dL (ref 13.0–17.0)
Immature Granulocytes: 0 %
Lymphocytes Relative: 18 %
Lymphs Abs: 1 10*3/uL (ref 0.7–4.0)
MCH: 33 pg (ref 26.0–34.0)
MCHC: 33.7 g/dL (ref 30.0–36.0)
MCV: 98 fL (ref 80.0–100.0)
Monocytes Absolute: 0.7 10*3/uL (ref 0.1–1.0)
Monocytes Relative: 12 %
Neutro Abs: 3.8 10*3/uL (ref 1.7–7.7)
Neutrophils Relative %: 64 %
Platelets: 189 10*3/uL (ref 150–400)
RBC: 4.42 MIL/uL (ref 4.22–5.81)
RDW: 13.2 % (ref 11.5–15.5)
WBC: 5.8 10*3/uL (ref 4.0–10.5)
nRBC: 0 % (ref 0.0–0.2)

## 2023-05-27 LAB — CMP (CANCER CENTER ONLY)
ALT: 16 U/L (ref 0–44)
AST: 14 U/L — ABNORMAL LOW (ref 15–41)
Albumin: 3.7 g/dL (ref 3.5–5.0)
Alkaline Phosphatase: 38 U/L (ref 38–126)
Anion gap: 5 (ref 5–15)
BUN: 18 mg/dL (ref 8–23)
CO2: 31 mmol/L (ref 22–32)
Calcium: 9.4 mg/dL (ref 8.9–10.3)
Chloride: 104 mmol/L (ref 98–111)
Creatinine: 0.96 mg/dL (ref 0.61–1.24)
GFR, Estimated: >60 mL/min (ref >60)
Glucose, Bld: 102 mg/dL — ABNORMAL HIGH (ref 70–99)
Potassium: 4.1 mmol/L (ref 3.5–5.1)
Sodium: 140 mmol/L (ref 135–145)
Total Bilirubin: 0.4 mg/dL (ref 0.3–1.2)
Total Protein: 6.3 g/dL — ABNORMAL LOW (ref 6.5–8.1)

## 2023-05-27 LAB — LACTATE DEHYDROGENASE: LDH: 103 U/L (ref 98–192)

## 2023-05-27 NOTE — Progress Notes (Signed)
[name]  Patient Care Team: Cory Covey, MD as PCP - General Cory Rotunda, MD as PCP - Cardiology (Cardiology) Cory [name], [facility] (Inactive) as Pharmacist (Pharmacist) Malmfelt, Cory Auer, RN as Oncology Nurse Navigator Cory Peak, MD as Consulting Physician (Radiation Oncology) Cory Moulds, MD as Consulting Physician (Hematology and Oncology)  CHIEF COMPLAINTS/PURPOSE OF CONSULTATION:  SCC supraglottis  ONCOLOGIC HISTORY: Initially presented to ENT with chronic left-sided sore throat for almost a year.  Initial evaluation did not show any possible causes for sore throat.  He was recommended PPI twice daily but went back to ENT with persistent sore throat he once again had a repeat fiberoptic exam which was stable.   CT neck done on November 08, 2021 did show subcentimeter focus of mucosal hyperenhancement along the posterior aspect of the uvula  laryngoscopy with direct visualization recommended to exclude a mucosal lesion at the site.  Additionally few tiny cystic-appearing foci along the left glossotonsillar sulcus.  Direct visualization recommended.  Nonspecific mildly enlarged right level 2 lymph node.  Multiple small calcific foci in the left palatine tonsil.Repeat fiberoptic exam was unremarkable hence Dr. 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  Tongue biopsy showed benign squamous mucosa with low to intermediate grade dysplasia. 05/07/2022: PET/CT showed asymmetric hypermetabolism in the left oropharynx and involving the left epiglottis.  Contralateral hypermetabolic level 2 cervical lymph nodes compatible with metastatic disease.  No hypermetabolic adenopathy in the left neck or in the chest.  1.2 cm short axis aortocaval lymph node in the upper abdomen is  hypermetabolic metastatic disease is a concern.  This lymph node has increased from 0.7 cm on the study from 2022.  No other metastatic disease. Received radiation from 05/30/2022-07/18/2022 and weekly cisplatin from 06/01/2022-07/18/2022.   INTERIM HISTORY:  Cory [name] returns for a follow up after completion of chemoradiation on 07/18/2022.    Discussed the use of AI scribe software for clinical note transcription with the patient, who gave verbal consent to proceed.  History of Present Illness        The patient, with a history of  SCC larynx, aortic valve stenosis and lymphadenopathy of unknown etiology, presents for a routine follow-up. He reports no new symptoms or changes in health since his last visit. He has seen multiple specialists, including a throat doctor and a cardiologist, who have not raised any concerns. He denies fevers, night sweats, and unintentional weight loss. He has seen Dr Jenne Pane, no concern for recurrence of laryngeal cancer.  The patient's aortic valve stenosis was evaluated with a recent echocardiogram, which showed no significant changes. The patient's thyroid function was last checked in June and was within normal limits. He has not seen his endocrinologist since March, but plans to do so in March of the following year.  The patient reports no changes in bowel habits or urination. He has noticed some weight loss, but this was prior to his last visit. He denies any difficulty eating or swallowing.   Rest of the pertinent 10 point ROS reviewed and neg.  Wt Readings from Last 3 Encounters:  05/27/23 197 lb 14.4 oz (89.8 kg)  05/07/23 195 lb 12.8 oz (88.8 kg)  02/25/23 201 lb (91.2 kg)    MEDICAL HISTORY:  Past Medical History:  Diagnosis Date   Aortic stenosis    a. Moderate by  echo 05/2020.   Arthritis    right hip   Cancer (HCC)    skin cancer nose, right left, upper left basel cell   Dysrhythmia    a-fib   Malignant neoplasm of supraglottis (HCC)  05/2022   PAF (paroxysmal atrial fibrillation) (HCC)    Peripheral arterial disease (HCC)    Tobacco abuse     SURGICAL HISTORY: Past Surgical History:  Procedure Laterality Date   ABDOMINAL AORTIC ENDOVASCULAR STENT GRAFT N/A 08/29/2020   Procedure: ABDOMINAL AORTIC ENDOVASCULAR STENT GRAFT;  Surgeon: Sherren Kerns, MD;  Location: [facility] OR;  Service: Vascular;  Laterality: N/A;   ABDOMINAL AORTOGRAM W/LOWER EXTREMITY Bilateral 06/24/2020   Procedure: ABDOMINAL AORTOGRAM W/LOWER EXTREMITY;  Surgeon: Sherren Kerns, MD;  Location: [facility] INVASIVE CV LAB;  Service: Cardiovascular;  Laterality: Bilateral;   BACK SURGERY     2005   BIOPSY  05/21/2022   Procedure: BIOPSY;  Surgeon: Meridee Score Netty Starring., MD;  Location: [facility];  Service: Gastroenterology;;   CERVICAL SPINE SURGERY     DIRECT LARYNGOSCOPY Bilateral 04/18/2022   Procedure: DIRECT LARYNGOSCOPY WITH BIOPSIES;  Surgeon: Christia Reading, MD;  Location: [facility] OR;  Service: ENT;  Laterality: Bilateral;   EMBOLECTOMY Right 08/29/2020   Procedure: POPLITEAL AND TIBIAL EMBOLECTOMY;  Surgeon: Sherren Kerns, MD;  Location: [facility] OR;  Service: Vascular;  Laterality: Right;   EMBOLIZATION (CATH LAB) Right 06/24/2020   Procedure: EMBOLIZATION;  Surgeon: Sherren Kerns, MD;  Location: [facility] INVASIVE CV LAB;  Service: Cardiovascular;  Laterality: Right;   ENDARTERECTOMY FEMORAL Right 08/29/2020   Procedure: ENDARTERECTOMY FEMORAL;  Surgeon: Sherren Kerns, MD;  Location: [facility] OR;  Service: Vascular;  Laterality: Right;   ESOPHAGOGASTRODUODENOSCOPY N/A 06/27/2022   Procedure: ESOPHAGOGASTRODUODENOSCOPY (EGD);  Surgeon: Lemar Lofty., MD;  Location: [facility];  Service: Gastroenterology;  Laterality: N/A;   ESOPHAGOGASTRODUODENOSCOPY (EGD) WITH PROPOFOL N/A 05/21/2022   Procedure: ESOPHAGOGASTRODUODENOSCOPY (EGD) WITH PROPOFOL;  Surgeon: Meridee Score Netty Starring., MD;  Location: [facility] END[facility]COPY;  Service: Gastroenterology;  Laterality:  N/A;   EUS N/A 05/21/2022   Procedure: UPPER ENDOSCOPIC ULTRASOUND (EUS) RADIAL;  Surgeon: Lemar Lofty., MD;  Location: [facility] ENDOSCOPY;  Service: Gastroenterology;  Laterality: N/A;   EUS N/A 06/27/2022   Procedure: UPPER ENDOSCOPIC ULTRASOUND (EUS) RADIAL;  Surgeon: Lemar Lofty., MD;  Location: [facility]COPY;  Service: Gastroenterology;  Laterality: N/A;   EYE SURGERY     FEMORAL ARTERY EXPLORATION Right 06/25/2020   Procedure: RIGHT GROIN EXPLORATION Evacuation of Hematoma  WITH REPAIR OF RIGHT Common FEMORAL ARTERY.;  Surgeon: Chuck Hint, MD;  Location: [facility] OR;  Service: Vascular;  Laterality: Right;   FEMORAL-POPLITEAL BYPASS GRAFT Left 11/22/2020   Procedure: LEFT ABOVE KNEE-BELOW KNEE BYPASS TIBIAL PERITONEAL TRUNK REVERSE IPSILATERAL GREATER SAPHENOUS VEIN;  Surgeon: Sherren Kerns, MD;  Location: [facility] OR;  Service: Vascular;  Laterality: Left;   FINE NEEDLE ASPIRATION N/A 05/21/2022   Procedure: FINE NEEDLE ASPIRATION (FNA) LINEAR;  Surgeon: Lemar Lofty., MD;  Location: [facility];  Service: Gastroenterology;  Laterality: N/A;   FINE NEEDLE ASPIRATION  06/27/2022   Procedure: FINE NEEDLE ASPIRATION;  Surgeon: Meridee Score Netty Starring., MD;  Location: [facility];  Service: Gastroenterology;;   HOT HEMOSTASIS N/A 06/27/2022   Procedure: HOT HEMOSTASIS (ARGON PLASMA COAGULATION/BICAP);  Surgeon: Lemar Lofty., MD;  Location: [facility];  Service: Gastroenterology;  Laterality: N/A;   IR GASTROSTOMY TUBE MOD SED  06/12/2022   IR GASTROSTOMY TUBE REMOVAL  09/04/2022   IR IMAGING GUIDED  PORT INSERTION  06/12/2022   IR REMOVAL TUN ACCESS W/ PORT W/O FL MOD SED  11/21/2022   LEG SURGERY     Trauma   LOWER EXTREMITY ANGIOGRAPHY Left 11/17/2020   Procedure: Lower Extremity Angiography;  Surgeon: Cephus Shelling, MD;  Location: [facility] INVASIVE CV LAB;  Service: Cardiovascular;  Laterality: Left;   PATCH ANGIOPLASTY Right 08/29/2020   Procedure:  PATCH ANGIOPLASTY RIGHT BELOW KNEE POPLITEAL ARTERY AND RIGHT COMMON FEMORAL ARTERY;  Surgeon: Sherren Kerns, MD;  Location: [facility] OR;  Service: Vascular;  Laterality: Right;   RETINAL DETACHMENT SURGERY     RIGID ESOPHAGOSCOPY  04/18/2022   Procedure: RIGID ESOPHAGOSCOPY;  Surgeon: Christia Reading, MD;  Location: [facility] OR;  Service: ENT;;   TONSILLECTOMY  04/18/2022   Procedure: LEFT TONSILLECTOMY;  Surgeon: Christia Reading, MD;  Location: [facility] OR;  Service: ENT;;   ULTRASOUND GUIDANCE FOR VASCULAR ACCESS Bilateral 08/29/2020   Procedure: ULTRASOUND GUIDANCE FOR VASCULAR ACCESS;  Surgeon: Sherren Kerns, MD;  Location: [facility] OR;  Service: Vascular;  Laterality: Bilateral;    SOCIAL HISTORY: Social History   Socioeconomic History   Marital status: Married    Spouse name: Not on file   Number of children: 5   Years of education: Not on file   Highest education level: Bachelor's degree (e.g., BA, AB, BS)  Occupational History   Not on file  Tobacco Use   Smoking status: Every Day    Current packs/day: 0.50    Average packs/day: 0.5 packs/day for 50.0 years (25.0 ttl pk-yrs)    Types: Cigarettes    Passive exposure: Never   Smokeless tobacco: Never  Vaping Use   Vaping status: Never Used  Substance and Sexual Activity   Alcohol use: Yes    Alcohol/week: 0.0 standard drinks of alcohol    Comment: 4 to 5 times a week; beer, wine and liquor    Drug use: No   Sexual activity: Not Currently  Other Topics Concern   Not on file  Social History Narrative   Scores reading tests.  Lives with wife.     Social Determinants of Health   Financial Resource Strain: Low Risk  (05/06/2023)   Overall Financial Resource Strain (CARDIA)    Difficulty of Paying Living Expenses: Not hard at all  Food Insecurity: No Food Insecurity (05/06/2023)   Hunger Vital Sign    Worried About Running Out of Food in the Last Year: Never true    Ran Out of Food in the Last Year: Never true  Transportation Needs: No  Transportation Needs (05/06/2023)   PRAPARE - Administrator, Civil Service (Medical): No    Lack of Transportation (Non-Medical): No  Physical Activity: Insufficiently Active (05/06/2023)   Exercise Vital Sign    Days of Exercise per Week: 5 days    Minutes of Exercise per Session: 20 min  Stress: No Stress Concern Present (05/06/2023)   [employer] of Occupational Health - Occupational Stress Questionnaire    Feeling of Stress : Not at all  Social Connections: Moderately Isolated (05/06/2023)   Social Connection and Isolation Panel [NHANES]    Frequency of Communication with Friends and Family: More than three times a week    Frequency of Social Gatherings with Friends and Family: More than three times a week    Attends Religious Services: Never    Database administrator or Organizations: No    Attends Banker Meetings: Never    Marital Status:  Married  Intimate Partner Violence: Not At Risk (02/25/2023)   Humiliation, Afraid, Rape, and Kick questionnaire    Fear of Current or Ex-Partner: No    Emotionally Abused: No    Physically Abused: No    Sexually Abused: No    FAMILY HISTORY: Family History  Problem Relation Age of Onset   Ovarian cancer Mother    Alcoholism Father    Breast cancer Sister    Ovarian cancer Sister     ALLERGIES:  is allergic to zithromax [azithromycin] and chlorpheniramine.  MEDICATIONS:  Current Outpatient Medications  Medication Sig Dispense Refill   acetaminophen (TYLENOL) 500 MG tablet Take 500 mg by mouth every 6 (six) hours as needed (for pain.).     amoxicillin-clavulanate (AUGMENTIN) 875-125 MG tablet Take 1 tablet by mouth 2 (two) times daily. 20 tablet 0   cetirizine (ZYRTEC) 10 MG tablet Take 1 tablet (10 mg total) by mouth daily. (Patient taking differently: Take 10 mg by mouth daily as needed for allergies.) 30 tablet 11   cholecalciferol (VITAMIN D) 25 MCG (1000 UNIT) tablet Take 1,000 Units by mouth 2 (two)  times daily.     fluticasone (FLONASE) 50 MCG/ACT nasal spray Place 2 sprays into both nostrils daily. 16 g 6   lidocaine-prilocaine (EMLA) cream Apply to affected area once 30 g 3   metoprolol tartrate (LOPRESSOR) 25 MG tablet Take 1 tablet by mouth twice daily as needed 180 tablet 0   nicotine (NICODERM CQ - DOSED IN MG/24 HOURS) 21 mg/24hr patch Place 1 patch (21 mg total) onto the skin daily. Apply 21 mg patch daily x 6 wk, then 14mg  patch daily x 2 wk, then 7 mg patch daily x 2 wk 14 patch 2   omeprazole (PRILOSEC) 40 MG capsule Take 1 capsule (40 mg total) by mouth daily. 30 capsule 6   rivaroxaban (XARELTO) 20 MG TABS tablet TAKE 1 TABLET BY MOUTH ONCE DAILY WITH SUPPER 90 tablet 1   rivaroxaban (XARELTO) 20 MG TABS tablet Take 1 tablet (20 mg total) by mouth daily with supper. 14 tablet    rivaroxaban (XARELTO) 20 MG TABS tablet Take 1 tablet (20 mg total) by mouth daily with supper.     rosuvastatin (CRESTOR) 10 MG tablet Take 1 tablet by mouth once daily 30 tablet 0   sodium chloride (OCEAN) 0.65 % SOLN nasal spray Place 1 spray into both nostrils as needed for congestion.     TIADYLT ER 120 MG 24 hr capsule TAKE 1 CAPSULE EVERY DAY (SCHEDULE OFFICE VISIT FOR FUTURE REFILLS) 90 capsule 3   No current facility-administered medications for this visit.     PHYSICAL EXAMINATION: ECOG PERFORMANCE STATUS: 1 - Symptomatic but completely ambulatory  Vitals:   05/27/23 1334  BP: 111/61  Pulse: 65  Resp: 16  Temp: (!) 97.5 F (36.4 C)  SpO2: 95%   Filed Weights   05/27/23 1334  Weight: 197 lb 14.4 oz (89.8 kg)    Physical Exam Constitutional:      Appearance: Normal appearance.  Cardiovascular:     Rate and Rhythm: Normal rate and regular rhythm.  Pulmonary:     Effort: Pulmonary effort is normal.     Breath sounds: Normal breath sounds.  Abdominal:     Comments:    Musculoskeletal:        General: No swelling or tenderness. Normal range of motion.     Cervical back:  Normal range of motion and neck supple. No rigidity.  Lymphadenopathy:  Cervical: No cervical adenopathy.  Skin:    General: Skin is warm and dry.  Neurological:     General: No focal deficit present.     Mental Status: He is alert.      LABORATORY DATA:  I have reviewed the data as listed Lab Results  Component Value Date   WBC 5.3 01/24/2023   HGB 14.2 01/24/2023   HCT 41.5 01/24/2023   MCV 98.8 01/24/2023   PLT 180 01/24/2023     Chemistry      Component Value Date/Time   NA 137 01/24/2023 1518   NA 141 05/16/2018 1425   K 4.3 01/24/2023 1518   CL 104 01/24/2023 1518   CO2 30 01/24/2023 1518   BUN 18 01/24/2023 1518   BUN 13 05/16/2018 1425   CREATININE 0.97 01/24/2023 1518      Component Value Date/Time   CALCIUM 9.5 01/24/2023 1518   ALKPHOS 36 (L) 01/24/2023 1518   AST 14 (L) 01/24/2023 1518   ALT 13 01/24/2023 1518   BILITOT 0.3 01/24/2023 1518       RADIOGRAPHIC STUDIES: I have personally reviewed the radiological images as listed and agreed with the findings in the report. ECHOCARDIOGRAM COMPLETE  Result Date: 05/21/2023    ECHOCARDIOGRAM REPORT   Patient Name:   Cory [name] Date of Exam: 05/21/2023 Medical Rec #:  [id number]     Height:       75.0 in Accession #:    [id number]    Weight:       195.8 lb Date of Birth:  [birth year]     BSA:          2.175 m Patient Age:    77 years      BP:           100/60 mmHg Patient Gender: M             HR:           55 bpm. Exam Location:  [address] Procedure: 2D Echo, Cardiac Doppler and Color Doppler Indications:    I35.0 Aortic Stenosis  History:        Patient has prior history of Echocardiogram examinations, most                 recent 05/15/2022. Arrythmias:Paroxysmal Atrial Fibrillation;                 Risk Factors:Current Smoker.  Sonographer:    Sedonia Small Rodgers-Jones RDCS Referring Phys: [id number] Wilberto HOCHREIN IMPRESSIONS  1. Left ventricular ejection fraction, by estimation, is 60 to 65%. The left  ventricle has normal function. The left ventricle has no regional wall motion abnormalities. There is mild concentric left ventricular hypertrophy. Left ventricular diastolic parameters are consistent with Grade I diastolic dysfunction (impaired relaxation). Elevated left ventricular end-diastolic pressure.  2. Right ventricular systolic function is normal. The right ventricular size is normal. There is mildly elevated pulmonary artery systolic pressure.  3. Left atrial size was moderately dilated.  4. The mitral valve is degenerative. No evidence of mitral valve regurgitation. No evidence of mitral stenosis. Moderate mitral annular calcification.  5. The aortic valve is calcified. There is severe calcifcation of the aortic valve. There is severe thickening of the aortic valve. Aortic valve regurgitation is mild to moderate. Moderate aortic valve stenosis. Aortic regurgitation PHT measures 566 msec. Aortic valve area, by VTI measures 1.73 cm. Aortic valve mean gradient measures 28.0 mmHg. Aortic valve Vmax measures 3.44 m/s.  6.  Aortic dilatation noted. There is moderate dilatation of the ascending aorta, measuring 45 mm.  7. The inferior vena cava is normal in size with <50% respiratory variability, suggesting right atrial pressure of 8 mmHg. FINDINGS  Left Ventricle: Left ventricular ejection fraction, by estimation, is 60 to 65%. The left ventricle has normal function. The left ventricle has no regional wall motion abnormalities. The left ventricular internal cavity size was normal in size. There is  mild concentric left ventricular hypertrophy. Left ventricular diastolic parameters are consistent with Grade I diastolic dysfunction (impaired relaxation). Elevated left ventricular end-diastolic pressure. Right Ventricle: The right ventricular size is normal. No increase in right ventricular wall thickness. Right ventricular systolic function is normal. There is mildly elevated pulmonary artery systolic pressure.  The tricuspid regurgitant velocity is 2.71  m/s, and with an assumed right atrial pressure of 8 mmHg, the estimated right ventricular systolic pressure is 37.4 mmHg. Left Atrium: Left atrial size was moderately dilated. Right Atrium: Right atrial size was normal in size. Pericardium: There is no evidence of pericardial effusion. Mitral Valve: The mitral valve is degenerative in appearance. Moderate mitral annular calcification. No evidence of mitral valve regurgitation. No evidence of mitral valve stenosis. Tricuspid Valve: The tricuspid valve is normal in structure. Tricuspid valve regurgitation is mild . No evidence of tricuspid stenosis. Aortic Valve: The aortic valve is calcified. There is severe calcifcation of the aortic valve. There is severe thickening of the aortic valve. Aortic valve regurgitation is mild to moderate. Aortic regurgitation PHT measures 566 msec. Moderate aortic stenosis is present. Aortic valve mean gradient measures 28.0 mmHg. Aortic valve [name] gradient measures 47.2 mmHg. Aortic valve area, by VTI measures 1.73 cm. Pulmonic Valve: The pulmonic valve was normal in structure. Pulmonic valve regurgitation is not visualized. No evidence of pulmonic stenosis. Aorta: Aortic dilatation noted. There is moderate dilatation of the ascending aorta, measuring 45 mm. Venous: The inferior vena cava is normal in size with less than 50% respiratory variability, suggesting right atrial pressure of 8 mmHg. IAS/Shunts: No atrial level shunt detected by color flow Doppler.  LEFT VENTRICLE PLAX 2D LVIDd:         4.80 cm   Diastology LVIDs:         2.50 cm   LV e' medial:    6.80 cm/s LV PW:         1.20 cm   LV E/e' medial:  17.9 LV IVS:        1.20 cm   LV e' lateral:   9.82 cm/s LVOT diam:     2.40 cm   LV E/e' lateral: 12.4 LV SV:         144 LV SV Index:   66 LVOT Area:     4.52 cm  RIGHT VENTRICLE             IVC RV Basal diam:  3.80 cm     IVC diam: 1.80 cm RV S prime:     13.20 cm/s TAPSE (M-mode):  2.8 cm LEFT ATRIUM             Index        RIGHT ATRIUM           Index LA diam:        4.30 cm 1.98 cm/m   RA Area:     18.90 cm LA Vol (A2C):   63.0 ml 28.97 ml/m  RA Volume:   59.50 ml  27.36 ml/m LA Vol (A4C):  95.4 ml 43.87 ml/m LA Biplane Vol: 79.5 ml 36.56 ml/m  AORTIC VALVE AV Area (Vmax):    1.71 cm AV Area (Vmean):   1.66 cm AV Area (VTI):     1.73 cm AV Vmax:           343.50 cm/s AV Vmean:          238.800 cm/s AV VTI:            0.830 m AV [name] Grad:      47.2 mmHg AV Mean Grad:      28.0 mmHg LVOT Vmax:         130.00 cm/s LVOT Vmean:        87.600 cm/s LVOT VTI:          0.318 m LVOT/AV VTI ratio: 0.38 AI PHT:            566 msec  AORTA Ao Root diam: 4.20 cm Ao Asc diam:  4.50 cm MITRAL VALVE                TRICUSPID VALVE MV Area (PHT): 2.46 cm     TR [name] grad:   29.4 mmHg MV Decel Time: 308 msec     TR Vmax:        271.00 cm/s MV E velocity: 121.50 cm/s MV A velocity: 142.50 cm/s  SHUNTS MV E/A ratio:  0.85         Systemic VTI:  0.32 m                             Systemic Diam: 2.40 cm Chilton Si MD Electronically signed by Chilton Si MD Signature Date/Time: 05/21/2023/10:47:08 PM    Final     ASSESSMENT AND PLAN:  Malignant neoplasm of supraglottis (HCC) This is a very pleasant 77 year old male patient with history of smoking now diagnosed with squamous cell carcinoma of the supraglottis with contralateral lymphadenopathy as well as aortocaval lymph node in the upper abdomen concerning for metastatic disease on PET/CT following up with Medical oncology  Further investigation of the aortocaval lymph node showed atypical lymphocytes suggestive of lymphoproliferative disorder.  However upon further review, it appears that the lymph node that was biopsied is not the lymph node that was desired to be biopsied.although this was abnormal.  We have requested a second biopsy for better characterization of the diagnosis. This was inconclusive as well.  We have now been  following up his symptoms, his labs on a periodic basis and PET/CT as needed given the unknown lymphoproliferative disease.  For the squamous cell carcinoma of the supraglottis, he will continue surveillance with Dr. Jenne Pane, his most recent laryngoscopy did not show any evidence of recurrence. No concerns for recurrence on physical exam or from review of systems.  He will return to clinic in about 3 to 4 months with repeat labs.  CBC, CMP and LDH ordered for today.  TSH due again in June 2025  I have spent a total of 30 minutes minutes of face-to-face and non-face-to-face time, preparing to see the patient, performing a medically appropriate examination, counseling and educating the patient, documenting clinical information in the electronic health record, and care coordination.

## 2023-05-27 NOTE — Assessment & Plan Note (Addendum)
This is a very pleasant 77 year old male patient with history of smoking now diagnosed with squamous cell carcinoma of the supraglottis with contralateral lymphadenopathy as well as aortocaval lymph node in the upper abdomen concerning for metastatic disease on PET/CT following up with Medical oncology  Further investigation of the aortocaval lymph node showed atypical lymphocytes suggestive of lymphoproliferative disorder.  However upon further review, it appears that the lymph node that was biopsied is not the lymph node that was desired to be biopsied.although this was abnormal.  We have requested a second biopsy for better characterization of the diagnosis. This was inconclusive as well.  We have now been following up his symptoms, his labs on a periodic basis and PET/CT as needed given the unknown lymphoproliferative disease.  For the squamous cell carcinoma of the supraglottis, he will continue surveillance with Dr. Jenne Pane, his most recent laryngoscopy did not show any evidence of recurrence. No concerns for recurrence on physical exam or from review of systems.  He will return to clinic in about 3 to 4 months with repeat labs.  CBC, CMP and LDH ordered for today.  TSH due again in June 2025

## 2023-06-28 ENCOUNTER — Other Ambulatory Visit: Payer: Self-pay | Admitting: Family Medicine

## 2023-07-08 NOTE — Progress Notes (Unsigned)
Cardiology Office Note:   Date:  07/11/2023  ID:  Cory [name], DOB [birth year], MRN [id number] PCP: Kristian Covey, MD  [facility] Providers Cardiologist:  Rollene Rotunda, MD {  History of Present Illness:   Cory [name] is a 77 y.o. male  for evaluation of PAF, PVD and AS.Marland Kitchen  He has been treated for squamous cell laryngeal cancer.  He has had chemotherapy and radiation.  He said he is doing well from this standpoint and getting routine follow-up.  He was treated with cisplatin.  He had a feeding tube and a Port-A-Cath for a while but these were removed.  He thinks he has done well from a cardiovascular standpoint.  He is doing yard work.  He does not have any chest pressure, neck or arm discomfort.  He does not have any palpitations, presyncope or syncope.  He does not have any new shortness of breath, PND or orthopnea.  He was able to come new in [place]. [place] and do some snorkeling without limitations.  ROS: As stated in the HPI and negative for all other systems.  Studies Reviewed:    EKG:   EKG Interpretation Date/Time:  Thursday July 11 2023 09:18:02 EST Ventricular Rate:  55 PR Interval:  196 QRS Duration:  116 QT Interval:  420 QTC Calculation: 401 R Axis:   -27  Text Interpretation: Sinus bradycardia Septal infarct , age undetermined When compared with ECG of 04-Apr-2004 12:10, No significant change was found Confirmed by Rollene Rotunda ([id number]) on 07/11/2023 9:46:05 AM     Risk Assessment/Calculations:              Physical Exam:   VS:  BP 122/78 (BP Location: Left Arm, Patient Position: Sitting, Cuff Size: Large)   Pulse (!) 55   Ht 6\' 3"  (1.905 m)   Wt 201 lb 9.6 oz (91.4 kg)   SpO2 97%   BMI 25.20 kg/m    Wt Readings from Last 3 Encounters:  07/11/23 201 lb 9.6 oz (91.4 kg)  05/27/23 197 lb 14.4 oz (89.8 kg)  05/07/23 195 lb 12.8 oz (88.8 kg)     GEN: Well nourished, well developed in no acute distress NECK: No JVD; No carotid bruits CARDIAC:  RRR, 3 out of 6 systolic murmur radiating at the aortic outflow tract, no diastolic murmurs, rubs, gallops RESPIRATORY:  Clear to auscultation without rales, wheezing or rhonchi  ABDOMEN: Soft, non-tender, non-distended EXTREMITIES:  No edema; No deformity   ASSESSMENT AND PLAN:   ATRIAL FIB:     He has some occasional paroxysms.  He tolerates anticoagulation.  No change in therapy.  TOBACCO:  He says that he has stopped smoking.   PVD:    He is status post left popliteal artery bypass.  He is having no new symptoms.  No change in therapy.  He will continue with risk reduction.    HTN:    This is controlled..  No change in therapy.  AS:    This was moderate in October 2024.  I will follow-up with an echo in November.   DYSLIPIDEMIA:    Lipids were excellent.  He will continue with meds as listed.   ASCENDING AORTIC DILATATION: This is enlarged and was listed as 45 mm on echo.  I will order a contrasted CT in June.  He had a noncontrasted CT last June but the aorta size was not mention.    Follow up with me in 12 months.   Signed,  Rollene Rotunda, MD

## 2023-07-11 ENCOUNTER — Ambulatory Visit: Payer: Medicare HMO | Attending: Cardiology | Admitting: Cardiology

## 2023-07-11 ENCOUNTER — Encounter: Payer: Self-pay | Admitting: Cardiology

## 2023-07-11 VITALS — BP 122/78 | HR 55 | Ht 75.0 in | Wt 201.6 lb

## 2023-07-11 DIAGNOSIS — E785 Hyperlipidemia, unspecified: Secondary | ICD-10-CM | POA: Diagnosis not present

## 2023-07-11 DIAGNOSIS — I4891 Unspecified atrial fibrillation: Secondary | ICD-10-CM

## 2023-07-11 DIAGNOSIS — I10 Essential (primary) hypertension: Secondary | ICD-10-CM | POA: Diagnosis not present

## 2023-07-11 DIAGNOSIS — I350 Nonrheumatic aortic (valve) stenosis: Secondary | ICD-10-CM | POA: Diagnosis not present

## 2023-07-11 DIAGNOSIS — Z720 Tobacco use: Secondary | ICD-10-CM

## 2023-07-11 DIAGNOSIS — I480 Paroxysmal atrial fibrillation: Secondary | ICD-10-CM | POA: Diagnosis not present

## 2023-07-11 MED ORDER — RIVAROXABAN 20 MG PO TABS: 20.0000 mg | ORAL_TABLET | Freq: Every day | ORAL | 1 refills | Status: DC

## 2023-07-11 NOTE — Patient Instructions (Addendum)
Medication Instructions:  No changes. Consult with Pharm D completed to review Xarelto program. *If you need a refill on your cardiac medications before your next appointment, please call your pharmacy*   Testing/Procedures: Your physician has requested that you have an echocardiogram. This needs to be completed November 2025.Echocardiography is a painless test that uses sound waves to create images of your heart. It provides your doctor with information about the size and shape of your heart and how well your heart's chambers and valves are working. This procedure takes approximately one hour. There are no restrictions for this procedure. [address] Please do NOT wear cologne, perfume, aftershave, or lotions (deodorant is allowed). Please arrive 15 minutes prior to your appointment time.  Please note: We ask at that you not bring children with you during ultrasound (echo/ vascular) testing. Due to room size and safety concerns, children are not allowed in the ultrasound rooms during exams. Our front office staff cannot provide observation of children in our lobby area while testing is being conducted. An adult accompanying a patient to their appointment will only be allowed in the ultrasound room at the discretion of the ultrasound technician under special circumstances. We apologize for any inconvenience.   Non-Cardiac CT Angiography (CTA), is a special type of CT scan that uses a computer to produce multi-dimensional views of major blood vessels throughout the body. In CT angiography, a contrast material is injected through an IV to help visualize the blood vessels This needs to be completed in June 2025.  Follow-Up: At [facility], you and your health needs are our priority.  As part of our continuing mission to provide you with exceptional heart care, we have created designated Provider Care Teams.  These Care Teams include your primary Cardiologist (physician) and Advanced  Practice Providers (APPs -  Physician Assistants and Nurse Practitioners) who all work together to provide you with the care you need, when you need it.  Your next appointment:   12 month(s)  Provider:   Rollene Rotunda, MD

## 2023-07-12 ENCOUNTER — Other Ambulatory Visit: Payer: Self-pay

## 2023-07-15 ENCOUNTER — Other Ambulatory Visit: Payer: Self-pay | Admitting: *Deleted

## 2023-07-15 DIAGNOSIS — I4891 Unspecified atrial fibrillation: Secondary | ICD-10-CM

## 2023-07-15 MED ORDER — RIVAROXABAN 20 MG PO TABS: 20.0000 mg | ORAL_TABLET | Freq: Every day | ORAL | 1 refills | Status: AC

## 2023-07-15 NOTE — Telephone Encounter (Signed)
Xarelto 20mg  refill request received from [facility]. Pt is 77 years old, weight-91.4kg, Crea-0.96 on 05/27/23, last seen by Dr. Antoine Poche on 07/11/23, Diagnosis-Afib, CrCl- 80.66 mL/min; Dose is appropriate based on dosing criteria. Will send in refill to requested pharmacy.

## 2023-07-18 ENCOUNTER — Other Ambulatory Visit: Payer: Self-pay | Admitting: Cardiology

## 2023-07-23 ENCOUNTER — Ambulatory Visit (INDEPENDENT_AMBULATORY_CARE_PROVIDER_SITE_OTHER): Payer: Medicare HMO | Admitting: Family Medicine

## 2023-07-23 VITALS — BP 110/78 | HR 52 | Temp 98.0°F | Wt 202.0 lb

## 2023-07-23 DIAGNOSIS — L03115 Cellulitis of right lower limb: Secondary | ICD-10-CM

## 2023-07-23 DIAGNOSIS — S81811A Laceration without foreign body, right lower leg, initial encounter: Secondary | ICD-10-CM

## 2023-07-23 MED ORDER — CEPHALEXIN 500 MG PO CAPS: 500.0000 mg | ORAL_CAPSULE | Freq: Four times a day (QID) | ORAL | 0 refills | Status: AC

## 2023-07-23 NOTE — Progress Notes (Signed)
   Subjective:    Patient ID: Cory [name], male    DOB: 08-06-1946, 77 y.o.   MRN: [id number]  HPI Here for a possible infection in a wound on the right lower leg. Three weeks ago he was paddling a kayak in the waters off [place]. [place] when he struck the leg against a cross bar of the kayak. This bled for awhile but he was able to get this stopped. Since then he has been dressing it with Bacitracin and gauze. The wound has remained tender, and now the skin around it is red. No fever.    Review of Systems  Constitutional: Negative.   Respiratory: Negative.    Cardiovascular: Negative.   Skin:  Positive for wound.       Objective:   Physical Exam Constitutional:      General: He is not in acute distress.    Appearance: Normal appearance.  Cardiovascular:     Rate and Rhythm: Normal rate and regular rhythm.     Pulses: Normal pulses.     Heart sounds: Normal heart sounds.  Pulmonary:     Effort: Pulmonary effort is normal.     Breath sounds: Normal breath sounds.  Skin:    Comments: There is a 2 cm linear closed laceration on the right shin. This is surrounded by a zone of erythema, warmth, and tenderness  Neurological:     Mental Status: He is alert.           Assessment & Plan:  Cellulitis from a laceration. We will treat with 10 days of Keflex. Recheck as needed.  Gershon Crane, MD

## 2023-07-26 ENCOUNTER — Other Ambulatory Visit: Payer: Self-pay | Admitting: Family Medicine

## 2023-08-02 ENCOUNTER — Other Ambulatory Visit: Payer: Self-pay | Admitting: Family Medicine

## 2023-08-22 ENCOUNTER — Encounter: Payer: Self-pay | Admitting: Hematology and Oncology

## 2023-08-30 ENCOUNTER — Other Ambulatory Visit: Payer: Self-pay | Admitting: Family Medicine

## 2023-09-08 ENCOUNTER — Other Ambulatory Visit: Payer: Self-pay | Admitting: Family Medicine

## 2023-09-10 ENCOUNTER — Encounter: Payer: Self-pay | Admitting: Hematology and Oncology

## 2023-09-13 ENCOUNTER — Other Ambulatory Visit: Payer: Self-pay

## 2023-09-13 DIAGNOSIS — I714 Abdominal aortic aneurysm, without rupture, unspecified: Secondary | ICD-10-CM

## 2023-09-13 DIAGNOSIS — I724 Aneurysm of artery of lower extremity: Secondary | ICD-10-CM

## 2023-09-13 DIAGNOSIS — I739 Peripheral vascular disease, unspecified: Secondary | ICD-10-CM

## 2023-09-17 ENCOUNTER — Inpatient Hospital Stay: Payer: Medicare (Managed Care)

## 2023-09-17 ENCOUNTER — Encounter: Payer: Self-pay | Admitting: Hematology and Oncology

## 2023-09-17 ENCOUNTER — Inpatient Hospital Stay: Payer: Medicare (Managed Care) | Attending: Hematology and Oncology | Admitting: Hematology and Oncology

## 2023-09-17 VITALS — BP 121/75 | HR 56 | Temp 97.4°F | Resp 16

## 2023-09-17 DIAGNOSIS — Z9221 Personal history of antineoplastic chemotherapy: Secondary | ICD-10-CM | POA: Insufficient documentation

## 2023-09-17 DIAGNOSIS — Z87891 Personal history of nicotine dependence: Secondary | ICD-10-CM | POA: Diagnosis not present

## 2023-09-17 DIAGNOSIS — D479 Neoplasm of uncertain behavior of lymphoid, hematopoietic and related tissue, unspecified: Secondary | ICD-10-CM

## 2023-09-17 DIAGNOSIS — C321 Malignant neoplasm of supraglottis: Secondary | ICD-10-CM

## 2023-09-17 DIAGNOSIS — Z923 Personal history of irradiation: Secondary | ICD-10-CM | POA: Insufficient documentation

## 2023-09-17 LAB — CBC WITH DIFFERENTIAL/PLATELET
Abs Immature Granulocytes: 0.01 10*3/uL (ref 0.00–0.07)
Basophils Absolute: 0 10*3/uL (ref 0.0–0.1)
Basophils Relative: 1 %
Eosinophils Absolute: 0.3 10*3/uL (ref 0.0–0.5)
Eosinophils Relative: 7 %
HCT: 44.6 % (ref 39.0–52.0)
Hemoglobin: 14.9 g/dL (ref 13.0–17.0)
Immature Granulocytes: 0 %
Lymphocytes Relative: 20 %
Lymphs Abs: 1 10*3/uL (ref 0.7–4.0)
MCH: 33 pg (ref 26.0–34.0)
MCHC: 33.4 g/dL (ref 30.0–36.0)
MCV: 98.9 fL (ref 80.0–100.0)
Monocytes Absolute: 0.5 10*3/uL (ref 0.1–1.0)
Monocytes Relative: 11 %
Neutro Abs: 3 10*3/uL (ref 1.7–7.7)
Neutrophils Relative %: 61 %
Platelets: 170 10*3/uL (ref 150–400)
RBC: 4.51 MIL/uL (ref 4.22–5.81)
RDW: 13.2 % (ref 11.5–15.5)
WBC: 4.8 10*3/uL (ref 4.0–10.5)
nRBC: 0 % (ref 0.0–0.2)

## 2023-09-17 LAB — CMP (CANCER CENTER ONLY)
ALT: 14 U/L (ref 0–44)
AST: 14 U/L — ABNORMAL LOW (ref 15–41)
Albumin: 3.8 g/dL (ref 3.5–5.0)
Alkaline Phosphatase: 36 U/L — ABNORMAL LOW (ref 38–126)
Anion gap: 3 — ABNORMAL LOW (ref 5–15)
BUN: 15 mg/dL (ref 8–23)
CO2: 31 mmol/L (ref 22–32)
Calcium: 9.1 mg/dL (ref 8.9–10.3)
Chloride: 102 mmol/L (ref 98–111)
Creatinine: 0.91 mg/dL (ref 0.61–1.24)
GFR, Estimated: >60 mL/min (ref >60)
Glucose, Bld: 104 mg/dL — ABNORMAL HIGH (ref 70–99)
Potassium: 4.3 mmol/L (ref 3.5–5.1)
Sodium: 136 mmol/L (ref 135–145)
Total Bilirubin: 0.4 mg/dL (ref 0.0–1.2)
Total Protein: 6.3 g/dL — ABNORMAL LOW (ref 6.5–8.1)

## 2023-09-17 LAB — LACTATE DEHYDROGENASE: LDH: 121 U/L (ref 98–192)

## 2023-09-17 NOTE — Assessment & Plan Note (Addendum)
This is a very pleasant 78 year old male patient with history of smoking now diagnosed with squamous cell carcinoma of the supraglottis with contralateral lymphadenopathy as well as aortocaval lymph node in the upper abdomen concerning for metastatic disease on PET/CT following up with Medical oncology  Further investigation of the aortocaval lymph node showed atypical lymphocytes suggestive of lymphoproliferative disorder.  However upon further review, it appears that the lymph node that was biopsied is not the lymph node that was desired to be biopsied,although this was abnormal.  We have requested a second biopsy for better characterization of the diagnosis. This was inconclusive as well.  Supraglottic Tumor Stable with ongoing surveillance by laryngoscopy. No new symptoms reported. -Continue with scheduled follow-up appointment with Dr. Jenne Pane in two weeks.  Lymphoproliferative Disorder Stable abdominal lymph node with no definitive diagnosis. No new symptoms suggestive of progression (weight loss, fevers, night sweats).  -Continue monitoring for symptoms. No routine PET scan planned unless symptoms develop or blood work changes. -Perform labs today.  Tobacco Use Patient reports quitting smoking a few months ago. -Encourage continued abstinence from smoking.  Follow-up in 3-4 months for lymphoproliferative disorder. If new symptoms develop (low-grade fever, night sweats, weight loss, or lymph node enlargement), patient to contact office.

## 2023-09-17 NOTE — Progress Notes (Signed)
[facility] CONSULT NOTE  Patient Care Team: Cory Covey, MD as PCP - General Cory Rotunda, MD as PCP - Cardiology (Cardiology) Cory [name], [facility] (Inactive) as Pharmacist (Pharmacist) [name], Cory Auer, RN as Oncology Nurse Navigator Cory Peak, MD as Consulting Physician (Radiation Oncology) Cory Moulds, MD as Consulting Physician (Hematology and Oncology)  CHIEF COMPLAINTS/PURPOSE OF CONSULTATION:  SCC supraglottis  ONCOLOGIC HISTORY: Initially presented to ENT with chronic left-sided sore throat for almost a year.  Initial evaluation did not show any possible causes for sore throat.  He was recommended PPI twice daily but went back to ENT with persistent sore throat he once again had a repeat fiberoptic exam which was stable.   CT neck done on November 08, 2021 did show subcentimeter focus of mucosal hyperenhancement along the posterior aspect of the uvula  laryngoscopy with direct visualization recommended to exclude a mucosal lesion at the site.  Additionally few tiny cystic-appearing foci along the left glossotonsillar sulcus.  Direct visualization recommended.  Nonspecific mildly enlarged right level 2 lymph node.  Multiple small calcific foci in the left palatine tonsil.Repeat fiberoptic exam was unremarkable hence Dr. 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  Tongue biopsy showed benign squamous mucosa with low to intermediate grade dysplasia. 05/07/2022: PET/CT showed asymmetric hypermetabolism in the left oropharynx and involving the left epiglottis.  Contralateral hypermetabolic level 2 cervical lymph nodes compatible with metastatic disease.  No hypermetabolic adenopathy in the left neck or in the chest.  1.2 cm short axis aortocaval lymph node in the upper abdomen is  hypermetabolic metastatic disease is a concern.  This lymph node has increased from 0.7 cm on the study from 2022.  No other metastatic disease. Received radiation from 05/30/2022-07/18/2022 and weekly cisplatin from 06/01/2022-07/18/2022.   INTERIM HISTORY:  Cory [name] returns for a follow up after completion of chemoradiation on 07/18/2022.   Discussed the use of AI scribe software for clinical note transcription with the patient, who gave verbal consent to proceed.  History of Present Illness    Cory [name] "Cory [name]" is a 78 year old male with a supraglottic tumor and lymphoproliferative disorder who presents for follow-up.  He has a supraglottic tumor and has been undergoing regular laryngoscopies to monitor the condition. There are no changes in his symptoms since the last visit, and he is able to eat most foods without difficulty. He has maintained his weight and denies any residual side effects from previous treatments.  Regarding his lymphoproliferative disorder, his abdominal lymph node was biopsied twice previously without a definitive diagnosis. He has not experienced any new symptoms such as unexplained weight loss, fevers, or night sweats. He notes a small lymph node that has been present for a long time and has not increased in size.  He quit smoking a few months ago, which is a positive change in his social history. No swelling in his legs, pain, or new symptoms such as low-grade fever, night sweats, or unexplained weight loss.  Rest of the pertinent 10 point ROS reviewed and neg.  Wt Readings from Last 3 Encounters:  07/23/23 202 lb (91.6 kg)  07/11/23 201 lb 9.6 oz (91.4 kg)  05/27/23 197 lb 14.4 oz (89.8 kg)    MEDICAL HISTORY:  Past Medical History:  Diagnosis Date   Aortic stenosis    a. Moderate by echo  05/2020.   Arthritis    right hip   Cancer (HCC)    skin cancer nose, right left, upper left basel cell   Dysrhythmia    a-fib   Malignant neoplasm of  supraglottis (HCC) 05/2022   PAF (paroxysmal atrial fibrillation) (HCC)    Peripheral arterial disease (HCC)    Tobacco abuse     SURGICAL HISTORY: Past Surgical History:  Procedure Laterality Date   ABDOMINAL AORTIC ENDOVASCULAR STENT GRAFT N/A 08/29/2020   Procedure: ABDOMINAL AORTIC ENDOVASCULAR STENT GRAFT;  Surgeon: Sherren Kerns, MD;  Location: [facility] OR;  Service: Vascular;  Laterality: N/A;   ABDOMINAL AORTOGRAM W/LOWER EXTREMITY Bilateral 06/24/2020   Procedure: ABDOMINAL AORTOGRAM W/LOWER EXTREMITY;  Surgeon: Sherren Kerns, MD;  Location: [facility] INVASIVE CV LAB;  Service: Cardiovascular;  Laterality: Bilateral;   BACK SURGERY     2005   BIOPSY  05/21/2022   Procedure: BIOPSY;  Surgeon: Meridee Score Netty Starring., MD;  Location: [facility];  Service: Gastroenterology;;   CERVICAL SPINE SURGERY     DIRECT LARYNGOSCOPY Bilateral 04/18/2022   Procedure: DIRECT LARYNGOSCOPY WITH BIOPSIES;  Surgeon: Christia Reading, MD;  Location: [facility] OR;  Service: ENT;  Laterality: Bilateral;   EMBOLECTOMY Right 08/29/2020   Procedure: POPLITEAL AND TIBIAL EMBOLECTOMY;  Surgeon: Sherren Kerns, MD;  Location: [facility] OR;  Service: Vascular;  Laterality: Right;   EMBOLIZATION (CATH LAB) Right 06/24/2020   Procedure: EMBOLIZATION;  Surgeon: Sherren Kerns, MD;  Location: [facility] INVASIVE CV LAB;  Service: Cardiovascular;  Laterality: Right;   ENDARTERECTOMY FEMORAL Right 08/29/2020   Procedure: ENDARTERECTOMY FEMORAL;  Surgeon: Sherren Kerns, MD;  Location: [facility] OR;  Service: Vascular;  Laterality: Right;   ESOPHAGOGASTRODUODENOSCOPY N/A 06/27/2022   Procedure: ESOPHAGOGASTRODUODENOSCOPY (EGD);  Surgeon: Lemar Lofty., MD;  Location: [facility];  Service: Gastroenterology;  Laterality: N/A;   ESOPHAGOGASTRODUODENOSCOPY (EGD) WITH PROPOFOL N/A 05/21/2022   Procedure: ESOPHAGOGASTRODUODENOSCOPY (EGD) WITH PROPOFOL;  Surgeon: Meridee Score Netty Starring., MD;  Location: [facility];  Service:  Gastroenterology;  Laterality: N/A;   EUS N/A 05/21/2022   Procedure: UPPER ENDOSCOPIC ULTRASOUND (EUS) RADIAL;  Surgeon: Lemar Lofty., MD;  Location: [facility];  Service: Gastroenterology;  Laterality: N/A;   EUS N/A 06/27/2022   Procedure: UPPER ENDOSCOPIC ULTRASOUND (EUS) RADIAL;  Surgeon: Lemar Lofty., MD;  Location: [facility];  Service: Gastroenterology;  Laterality: N/A;   EYE SURGERY     FEMORAL ARTERY EXPLORATION Right 06/25/2020   Procedure: RIGHT GROIN EXPLORATION Evacuation of Hematoma  WITH REPAIR OF RIGHT Common FEMORAL ARTERY.;  Surgeon: Chuck Hint, MD;  Location: [facility] OR;  Service: Vascular;  Laterality: Right;   FEMORAL-POPLITEAL BYPASS GRAFT Left 11/22/2020   Procedure: LEFT ABOVE KNEE-BELOW KNEE BYPASS TIBIAL PERITONEAL TRUNK REVERSE IPSILATERAL GREATER SAPHENOUS VEIN;  Surgeon: Sherren Kerns, MD;  Location: [facility] OR;  Service: Vascular;  Laterality: Left;   FINE NEEDLE ASPIRATION N/A 05/21/2022   Procedure: FINE NEEDLE ASPIRATION (FNA) LINEAR;  Surgeon: Lemar Lofty., MD;  Location: [facility];  Service: Gastroenterology;  Laterality: N/A;   FINE NEEDLE ASPIRATION  06/27/2022   Procedure: FINE NEEDLE ASPIRATION;  Surgeon: Meridee Score Netty Starring., MD;  Location: [facility];  Service: Gastroenterology;;   HOT HEMOSTASIS N/A 06/27/2022   Procedure: HOT HEMOSTASIS (ARGON PLASMA COAGULATION/BICAP);  Surgeon: Lemar Lofty., MD;  Location: [facility];  Service: Gastroenterology;  Laterality: N/A;   IR GASTROSTOMY TUBE MOD SED  06/12/2022   IR GASTROSTOMY TUBE REMOVAL  09/04/2022   IR IMAGING GUIDED PORT  INSERTION  06/12/2022   IR REMOVAL TUN ACCESS W/ PORT W/O FL MOD SED  11/21/2022   LEG SURGERY     Trauma   LOWER EXTREMITY ANGIOGRAPHY Left 11/17/2020   Procedure: Lower Extremity Angiography;  Surgeon: Cephus Shelling, MD;  Location: [facility] INVASIVE CV LAB;  Service: Cardiovascular;  Laterality: Left;   PATCH ANGIOPLASTY  Right 08/29/2020   Procedure: PATCH ANGIOPLASTY RIGHT BELOW KNEE POPLITEAL ARTERY AND RIGHT COMMON FEMORAL ARTERY;  Surgeon: Sherren Kerns, MD;  Location: [facility] OR;  Service: Vascular;  Laterality: Right;   RETINAL DETACHMENT SURGERY     RIGID ESOPHAGOSCOPY  04/18/2022   Procedure: RIGID ESOPHAGOSCOPY;  Surgeon: Christia Reading, MD;  Location: [facility] OR;  Service: ENT;;   TONSILLECTOMY  04/18/2022   Procedure: LEFT TONSILLECTOMY;  Surgeon: Christia Reading, MD;  Location: [facility] OR;  Service: ENT;;   ULTRASOUND GUIDANCE FOR VASCULAR ACCESS Bilateral 08/29/2020   Procedure: ULTRASOUND GUIDANCE FOR VASCULAR ACCESS;  Surgeon: Sherren Kerns, MD;  Location: [facility] OR;  Service: Vascular;  Laterality: Bilateral;    SOCIAL HISTORY: Social History   Socioeconomic History   Marital status: Married    Spouse name: Not on file   Number of children: 5   Years of education: Not on file   Highest education level: Master's degree (e.g., MA, MS, MEng, MEd, MSW, MBA)  Occupational History   Not on file  Tobacco Use   Smoking status: Every Day    Current packs/day: 0.50    Average packs/day: 0.5 packs/day for 50.0 years (25.0 ttl pk-yrs)    Types: Cigarettes    Passive exposure: Never   Smokeless tobacco: Never  Vaping Use   Vaping status: Never Used  Substance and Sexual Activity   Alcohol use: Yes    Alcohol/week: 0.0 standard drinks of alcohol    Comment: 4 to 5 times a week; beer, wine and liquor    Drug use: No   Sexual activity: Not Currently  Other Topics Concern   Not on file  Social History Narrative   Scores reading tests.  Lives with wife.     Social Drivers of Corporate investment banker Strain: Low Risk  (07/23/2023)   Overall Financial Resource Strain (CARDIA)    Difficulty of Paying Living Expenses: Not hard at all  Food Insecurity: No Food Insecurity (07/23/2023)   Hunger Vital Sign    Worried About Running Out of Food in the Last Year: Never true    Ran Out of Food in the Last Year:  Never true  Transportation Needs: No Transportation Needs (07/23/2023)   PRAPARE - Administrator, Civil Service (Medical): No    Lack of Transportation (Non-Medical): No  Physical Activity: Sufficiently Active (07/23/2023)   Exercise Vital Sign    Days of Exercise per Week: 5 days    Minutes of Exercise per Session: 30 min  Recent Concern: Physical Activity - Insufficiently Active (05/06/2023)   Exercise Vital Sign    Days of Exercise per Week: 5 days    Minutes of Exercise per Session: 20 min  Stress: No Stress Concern Present (07/23/2023)   [employer] of Occupational Health - Occupational Stress Questionnaire    Feeling of Stress : Not at all  Social Connections: Moderately Integrated (07/23/2023)   Social Connection and Isolation Panel [NHANES]    Frequency of Communication with Friends and Family: More than three times a week    Frequency of Social Gatherings with Friends and Family: Three  times a week    Attends Religious Services: Never    Active Member of Clubs or Organizations: Yes    Attends Banker Meetings: 1 to 4 times per year    Marital Status: Married  Recent Concern: Social Connections - Moderately Isolated (05/06/2023)   Social Connection and Isolation Panel [NHANES]    Frequency of Communication with Friends and Family: More than three times a week    Frequency of Social Gatherings with Friends and Family: More than three times a week    Attends Religious Services: Never    Database administrator or Organizations: No    Attends Banker Meetings: Never    Marital Status: Married  Catering manager Violence: Not At Risk (02/25/2023)   Humiliation, Afraid, Rape, and Kick questionnaire    Fear of Current or Ex-Partner: No    Emotionally Abused: No    Physically Abused: No    Sexually Abused: No    FAMILY HISTORY: Family History  Problem Relation Age of Onset   Ovarian cancer Mother    Alcoholism Father    Breast  cancer Sister    Ovarian cancer Sister     ALLERGIES:  is allergic to zithromax [azithromycin] and chlorpheniramine.  MEDICATIONS:  Current Outpatient Medications  Medication Sig Dispense Refill   acetaminophen (TYLENOL) 500 MG tablet Take 500 mg by mouth every 6 (six) hours as needed (for pain.).     cetirizine (ZYRTEC) 10 MG tablet Take 1 tablet (10 mg total) by mouth daily. (Patient taking differently: Take 10 mg by mouth daily as needed for allergies.) 30 tablet 11   cholecalciferol (VITAMIN D) 25 MCG (1000 UNIT) tablet Take 1,000 Units by mouth 2 (two) times daily.     diltiazem (TIADYLT ER) 120 MG 24 hr capsule Take 1 capsule (120 mg total) by mouth daily. 90 capsule 3   fluticasone (FLONASE) 50 MCG/ACT nasal spray Place 2 sprays into both nostrils daily. 16 g 6   lidocaine-prilocaine (EMLA) cream Apply to affected area once 30 g 3   metoprolol tartrate (LOPRESSOR) 25 MG tablet Take 1 tablet by mouth twice daily as needed 180 tablet 0   nicotine (NICODERM CQ - DOSED IN MG/24 HOURS) 21 mg/24hr patch Place 1 patch (21 mg total) onto the skin daily. Apply 21 mg patch daily x 6 wk, then 14mg  patch daily x 2 wk, then 7 mg patch daily x 2 wk 14 patch 2   omeprazole (PRILOSEC) 40 MG capsule Take 1 capsule (40 mg total) by mouth daily. 30 capsule 6   rivaroxaban (XARELTO) 20 MG TABS tablet Take 1 tablet (20 mg total) by mouth daily with supper. 90 tablet 1   rosuvastatin (CRESTOR) 10 MG tablet Take 1 tablet by mouth once daily 30 tablet 0   sodium chloride (OCEAN) 0.65 % SOLN nasal spray Place 1 spray into both nostrils as needed for congestion.     No current facility-administered medications for this visit.     PHYSICAL EXAMINATION: ECOG PERFORMANCE STATUS: 1 - Symptomatic but completely ambulatory  Vitals:   09/17/23 1322  BP: 121/75  Pulse: (!) 56  Resp: 16  Temp: (!) 97.4 F (36.3 C)  SpO2: 97%    There were no vitals filed for this visit.   Physical Exam Constitutional:       Appearance: Normal appearance.  Cardiovascular:     Rate and Rhythm: Normal rate and regular rhythm.  Pulmonary:     Effort: Pulmonary effort  is normal.     Breath sounds: Normal breath sounds.  Abdominal:     Comments:    Musculoskeletal:        General: No swelling or tenderness. Normal range of motion.     Cervical back: Normal range of motion and neck supple. No rigidity.  Lymphadenopathy:     Cervical: No cervical adenopathy.  Skin:    General: Skin is warm and dry.  Neurological:     General: No focal deficit present.     Mental Status: He is alert.      LABORATORY DATA:  I have reviewed the data as listed Lab Results  Component Value Date   WBC 5.8 05/27/2023   HGB 14.6 05/27/2023   HCT 43.3 05/27/2023   MCV 98.0 05/27/2023   PLT 189 05/27/2023     Chemistry      Component Value Date/Time   NA 140 05/27/2023 1422   NA 141 05/16/2018 1425   K 4.1 05/27/2023 1422   CL 104 05/27/2023 1422   CO2 31 05/27/2023 1422   BUN 18 05/27/2023 1422   BUN 13 05/16/2018 1425   CREATININE 0.96 05/27/2023 1422      Component Value Date/Time   CALCIUM 9.4 05/27/2023 1422   ALKPHOS 38 05/27/2023 1422   AST 14 (L) 05/27/2023 1422   ALT 16 05/27/2023 1422   BILITOT 0.4 05/27/2023 1422       RADIOGRAPHIC STUDIES: I have personally reviewed the radiological images as listed and agreed with the findings in the report. No results found.  ASSESSMENT AND PLAN:  Malignant neoplasm of supraglottis (HCC) This is a very pleasant 78 year old male patient with history of smoking now diagnosed with squamous cell carcinoma of the supraglottis with contralateral lymphadenopathy as well as aortocaval lymph node in the upper abdomen concerning for metastatic disease on PET/CT following up with Medical oncology  Further investigation of the aortocaval lymph node showed atypical lymphocytes suggestive of lymphoproliferative disorder.  However upon further review, it appears that  the lymph node that was biopsied is not the lymph node that was desired to be biopsied,although this was abnormal.  We have requested a second biopsy for better characterization of the diagnosis. This was inconclusive as well.  Supraglottic Tumor Stable with ongoing surveillance by laryngoscopy. No new symptoms reported. -Continue with scheduled follow-up appointment with Dr. Jenne [name] in two weeks.  Lymphoproliferative Disorder Stable abdominal lymph node with no definitive diagnosis. No new symptoms suggestive of progression (weight loss, fevers, night sweats).  -Continue monitoring for symptoms. No routine PET scan planned unless symptoms develop or blood work changes. -Perform labs today.  Tobacco Use Patient reports quitting smoking a few months ago. -Encourage continued abstinence from smoking.  Follow-up in 3-4 months for lymphoproliferative disorder. If new symptoms develop (low-grade fever, night sweats, weight loss, or lymph node enlargement), patient to contact office.  I have spent a total of 20 minutes minutes of face-to-face and non-face-to-face time, preparing to see the patient, performing a medically appropriate examination, counseling and educating the patient, documenting clinical information in the electronic health record, and care coordination.   Cory Moulds MD

## 2023-09-30 ENCOUNTER — Ambulatory Visit (INDEPENDENT_AMBULATORY_CARE_PROVIDER_SITE_OTHER)
Admission: RE | Admit: 2023-09-30 | Discharge: 2023-09-30 | Disposition: A | Discharge status: Home / Self Care | Payer: Medicare (Managed Care) | Source: Ambulatory Visit | Attending: Surgery | Admitting: Surgery

## 2023-09-30 ENCOUNTER — Ambulatory Visit (HOSPITAL_COMMUNITY)
Admission: RE | Admit: 2023-09-30 | Discharge: 2023-09-30 | Disposition: A | Discharge status: Home / Self Care | Payer: Medicare (Managed Care) | Source: Ambulatory Visit | Attending: Surgery | Admitting: Surgery

## 2023-09-30 ENCOUNTER — Ambulatory Visit (INDEPENDENT_AMBULATORY_CARE_PROVIDER_SITE_OTHER): Payer: Medicare (Managed Care) | Admitting: Physician Assistant

## 2023-09-30 VITALS — BP 153/82 | HR 48 | Temp 98.4°F | Ht 75.0 in | Wt 210.0 lb

## 2023-09-30 DIAGNOSIS — I739 Peripheral vascular disease, unspecified: Secondary | ICD-10-CM

## 2023-09-30 DIAGNOSIS — I714 Abdominal aortic aneurysm, without rupture, unspecified: Secondary | ICD-10-CM

## 2023-09-30 DIAGNOSIS — I724 Aneurysm of artery of lower extremity: Secondary | ICD-10-CM | POA: Diagnosis not present

## 2023-09-30 LAB — VAS US ABI WITH/WO TBI
Left ABI: 1.17
Right ABI: 1.25

## 2023-09-30 NOTE — Progress Notes (Signed)
 [facility] [place] HISTORY AND PHYSICAL   History of Present Illness:  Patient is a 78 y.o. year old male who presents for evaluation of  , In January 2022, he underwent bifurcated aneurysm stent graft repair of a 4.6 cm right common iliac aneurysm and 3.8 cm infrarenal aneurysm.  He also had coil embolization of a 3.3 cm right internal iliac aneurysm.  This was complicated by a hematoma in his right groin after his coil embolization.  At the time of his stent graft repair he also required right common femoral endarterectomy and popliteal and tibial embolectomy on the right side.     On 11/22/2020, he underwent Left above-knee to below-knee popliteal/tibioperoneal trunk bypass with ipsilateral reversed left greater saphenous vein, intraoperative arteriogram by Dr. Darrick Penna.  He was discharged home on POD 2.        Pt was last seen 11/09/2021 and at that time, he was doing well without claudication, rest pain or non healing wounds.  He was not having any new or changing abdominal or back pain.  Duplex revealed that his right CIA aneurysm was continuing to decrease in size and bypass patent without stenosis.  His left DP pulse was palpable.   He was on Xarelto for afib.    He walks for exercise multiple days a week and denies rest pain, claudication or non healing wounds.    The pt is on a statin for cholesterol management.    The pt is not on an aspirin.    Other AC:  Xarelto The pt is on BB, CCB for hypertension.  The pt does not have diabetes.     Past Medical History:  Diagnosis Date   AAA (abdominal aortic aneurysm) (HCC)    Aortic stenosis    a. Moderate by echo 05/2020.   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/2022   PAF (paroxysmal atrial fibrillation) (HCC)    Peripheral arterial disease (HCC)    Tobacco abuse     Past Surgical  History:  Procedure Laterality Date   ABDOMINAL AORTIC ENDOVASCULAR STENT GRAFT N/A 08/29/2020   Procedure: ABDOMINAL AORTIC ENDOVASCULAR STENT GRAFT;  Surgeon: Sherren Kerns, MD;  Location: [facility] OR;  Service: Vascular;  Laterality: N/A;   ABDOMINAL AORTOGRAM W/LOWER EXTREMITY Bilateral 06/24/2020   Procedure: ABDOMINAL AORTOGRAM W/LOWER EXTREMITY;  Surgeon: Sherren Kerns, MD;  Location: [facility]AS[facility] CV LAB;  Service: Cardiovascular;  Laterality: Bilateral;   BACK SURGERY     2005   BIOPSY  05/21/2022   Procedure: BIOPSY;  Surgeon: Meridee Score Netty Starring., MD;  Location: [facility];  Service: Gastroenterology;;   CERVICAL SPINE SURGERY     DIRECT LARYNGOSCOPY Bilateral 04/18/2022   Procedure: DIRECT LARYNGOSCOPY WITH BIOPSIES;  Surgeon: Christia Reading, MD;  Location: [facility] OR;  Service: ENT;  Laterality: Bilateral;   EMBOLECTOMY Right 08/29/2020   Procedure: POPLITEAL AND TIBIAL EMBOLECTOMY;  Surgeon: Sherren Kerns, MD;  Location: [facility] OR;  Service: Vascular;  Laterality: Right;   EMBOLIZATION (CATH LAB) Right 06/24/2020   Procedure: EMBOLIZATION;  Surgeon: Sherren Kerns, MD;  Location: [facility] INVAS[facility] CV LAB;  Service: Cardiovascular;  Laterality: Right;   ENDARTERECTOMY FEMORAL Right 08/29/2020   Procedure: ENDARTERECTOMY FEMORAL;  Surgeon: Sherren Kerns, MD;  Location: [facility] OR;  Service: Vascular;  Laterality: Right;  ESOPHAGOGASTRODUODENOSCOPY N/A 06/27/2022   Procedure: ESOPHAGOGASTRODUODENOSCOPY (EGD);  Surgeon: Lemar Lofty., MD;  Location: [facility];  Service: Gastroenterology;  Laterality: N/A;   ESOPHAGOGASTRODUODENOSCOPY (EGD) WITH PROPOFOL N/A 05/21/2022   Procedure: ESOPHAGOGASTRODUODENOSCOPY (EGD) WITH PROPOFOL;  Surgeon: Meridee Score Netty Starring., MD;  Location: [facility];  Service: Gastroenterology;  Laterality: N/A;   EUS N/A 05/21/2022   Procedure: UPPER ENDOSCOPIC ULTRASOUND (EUS) RADIAL;  Surgeon: Lemar Lofty., MD;  Location: [facility];   Service: Gastroenterology;  Laterality: N/A;   EUS N/A 06/27/2022   Procedure: UPPER ENDOSCOPIC ULTRASOUND (EUS) RADIAL;  Surgeon: Lemar Lofty., MD;  Location: [facility];  Service: Gastroenterology;  Laterality: N/A;   EYE SURGERY     FEMORAL ARTERY EXPLORATION Right 06/25/2020   Procedure: RIGHT GROIN EXPLORATION Evacuation of Hematoma  WITH REPAIR OF RIGHT Common FEMORAL ARTERY.;  Surgeon: Chuck Hint, MD;  Location: [facility] OR;  Service: Vascular;  Laterality: Right;   FEMORAL-POPLITEAL BYPASS GRAFT Left 11/22/2020   Procedure: LEFT ABOVE KNEE-BELOW KNEE BYPASS TIBIAL PERITONEAL TRUNK REVERSE IPSILATERAL GREATER SAPHENOUS VEIN;  Surgeon: Sherren Kerns, MD;  Location: [facility] OR;  Service: Vascular;  Laterality: Left;   FINE NEEDLE ASPIRATION N/A 05/21/2022   Procedure: FINE NEEDLE ASPIRATION (FNA) LINEAR;  Surgeon: Lemar Lofty., MD;  Location: [facility];  Service: Gastroenterology;  Laterality: N/A;   FINE NEEDLE ASPIRATION  06/27/2022   Procedure: FINE NEEDLE ASPIRATION;  Surgeon: Meridee Score Netty Starring., MD;  Location: [facility];  Service: Gastroenterology;;   HOT HEMOSTASIS N/A 06/27/2022   Procedure: HOT HEMOSTASIS (ARGON PLASMA COAGULATION/BICAP);  Surgeon: Lemar Lofty., MD;  Location: [facility];  Service: Gastroenterology;  Laterality: N/A;   IR GASTROSTOMY TUBE MOD SED  06/12/2022   IR GASTROSTOMY TUBE REMOVAL  09/04/2022   IR IMAGING GUIDED PORT INSERTION  06/12/2022   IR REMOVAL TUN ACCESS W/ PORT W/O FL MOD SED  11/21/2022   LEG SURGERY     Trauma   LOWER EXTREMITY ANGIOGRAPHY Left 11/17/2020   Procedure: Lower Extremity Angiography;  Surgeon: Cephus Shelling, MD;  Location: [facility] INVASIVE CV LAB;  Service: Cardiovascular;  Laterality: Left;   PATCH ANGIOPLASTY Right 08/29/2020   Procedure: PATCH ANGIOPLASTY RIGHT BELOW KNEE POPLITEAL ARTERY AND RIGHT COMMON FEMORAL ARTERY;  Surgeon: Sherren Kerns, MD;  Location: [facility] OR;  Service:  Vascular;  Laterality: Right;   RETINAL DETACHMENT SURGERY     RIGID ESOPHAGOSCOPY  04/18/2022   Procedure: RIGID ESOPHAGOSCOPY;  Surgeon: Christia Reading, MD;  Location: [facility] OR;  Service: ENT;;   TONSILLECTOMY  04/18/2022   Procedure: LEFT TONSILLECTOMY;  Surgeon: Christia Reading, MD;  Location: [facility] OR;  Service: ENT;;   ULTRASOUND GUIDANCE FOR VASCULAR ACCESS Bilateral 08/29/2020   Procedure: ULTRASOUND GUIDANCE FOR VASCULAR ACCESS;  Surgeon: Sherren Kerns, MD;  Location: [facility] OR;  Service: Vascular;  Laterality: Bilateral;    ROS:   General:  No weight loss, Fever, chills  HEENT: No recent headaches, no nasal bleeding, no visual changes, no sore throat  Neurologic: No dizziness, blackouts, seizures. No recent symptoms of stroke or mini- stroke. No recent episodes of slurred speech, or temporary blindness.  Cardiac: No recent episodes of chest pain/pressure, no shortness of breath at rest.  No shortness of breath with exertion.  Denies history of atrial fibrillation or irregular heartbeat  Vascular: No history of rest pain in feet.  No history of claudication.  No history of non-healing ulcer, No history of DVT   Pulmonary: No home oxygen, no  productive cough, no hemoptysis,  No asthma or wheezing  Musculoskeletal:  [ ]  Arthritis, [ ]  Low back pain,  [ ]  Joint pain  Hematologic:No history of hypercoagulable state.  No history of easy bleeding.  No history of anemia  Gastrointestinal: No hematochezia or melena,  No gastroesophageal reflux, no trouble swallowing  Urinary: [ ]  chronic Kidney disease, [ ]  on HD - [ ]  MWF or [ ]  TTHS, [ ]  Burning with urination, [ ]  Frequent urination, [ ]  Difficulty urinating;   Skin: No rashes  Psychological: No history of anxiety,  No history of depression  Social History Social History   Tobacco Use   Smoking status: Every Day    Current packs/day: 0.50    Average packs/day: 0.5 packs/day for 50.0 years (25.0 ttl pk-yrs)    Types: Cigarettes     Passive exposure: Never   Smokeless tobacco: Never  Vaping Use   Vaping status: Never Used  Substance Use Topics   Alcohol use: Yes    Alcohol/week: 0.0 standard drinks of alcohol    Comment: 4 to 5 times a week; beer, wine and liquor    Drug use: No    Family History Family History  Problem Relation Age of Onset   Ovarian cancer Mother    Alcoholism Father    Breast cancer Sister    Ovarian cancer Sister     Allergies  Allergies  Allergen Reactions   Zithromax [Azithromycin] Diarrhea   Chlorpheniramine Cough and Itching     Current Outpatient Medications  Medication Sig Dispense Refill   acetaminophen (TYLENOL) 500 MG tablet Take 500 mg by mouth every 6 (six) hours as needed (for pain.).     cetirizine (ZYRTEC) 10 MG tablet Take 1 tablet (10 mg total) by mouth daily. (Patient taking differently: Take 10 mg by mouth daily as needed for allergies.) 30 tablet 11   cholecalciferol (VITAMIN D) 25 MCG (1000 UNIT) tablet Take 1,000 Units by mouth 2 (two) times daily.     diltiazem (TIADYLT ER) 120 MG 24 hr capsule Take 1 capsule (120 mg total) by mouth daily. 90 capsule 3   fluticasone (FLONASE) 50 MCG/ACT nasal spray Place 2 sprays into both nostrils daily. 16 g 6   lidocaine-prilocaine (EMLA) cream Apply to affected area once 30 g 3   metoprolol tartrate (LOPRESSOR) 25 MG tablet Take 1 tablet by mouth twice daily as needed 180 tablet 0   nicotine (NICODERM CQ - DOSED IN MG/24 HOURS) 21 mg/24hr patch Place 1 patch (21 mg total) onto the skin daily. Apply 21 mg patch daily x 6 wk, then 14mg  patch daily x 2 wk, then 7 mg patch daily x 2 wk 14 patch 2   omeprazole (PRILOSEC) 40 MG capsule Take 1 capsule (40 mg total) by mouth daily. 30 capsule 6   rivaroxaban (XARELTO) 20 MG TABS tablet Take 1 tablet (20 mg total) by mouth daily with supper. 90 tablet 1   rosuvastatin (CRESTOR) 10 MG tablet Take 1 tablet by mouth once daily 30 tablet 0   sodium chloride (OCEAN) 0.65 % SOLN nasal  spray Place 1 spray into both nostrils as needed for congestion.     No current facility-administered medications for this visit.    Physical Examination  Vitals:   09/30/23 0927  BP: (!) 153/82  Pulse: (!) 48  Temp: 98.4 F (36.9 C)  TempSrc: Temporal  SpO2: 93%  Weight: 210 lb (95.3 kg)  Height: 6\' 3"  (1.905 m)  Body mass index is 26.25 kg/m.  General:  Alert and oriented, no acute distress HEENT: Normal Neck: No bruit or JVD Pulmonary: Clear to auscultation bilaterally Cardiac: Regular Rate and Rhythm with murmur Abdomen: Soft, non-tender, non-distended, no mass, no scars Skin: No rash Extremity :  radial and femoral pulses palpable B.  B LE without ischemic changes, darkened skin from venous insufficiency.   Musculoskeletal: No deformity or edema  Neurologic: Upper and lower extremity motor 5/5 and symmetric  DATA:   Examination Guidelines: A complete evaluation includes B-mode imaging,  spectral  Doppler, color Doppler, and power Doppler as needed of all accessible  portions  of each vessel. Bilateral testing is considered an integral part of a  complete  examination. Limited examinations for reoccurring indications may be  performed  as noted      +-----------+--------+-----+--------+----------+----------+  LEFT      PSV cm/sRatioStenosisWaveform  Comments    +-----------+--------+-----+--------+----------+----------+  CFA Distal 93                   biphasic              +-----------+--------+-----+--------+----------+----------+  DFA       100                  biphasic              +-----------+--------+-----+--------+----------+----------+  SFA Prox   90                   biphasic              +-----------+--------+-----+--------+----------+----------+  SFA Mid    78                   biphasic              +-----------+--------+-----+--------+----------+----------+  ATA Distal 33                    monophasicRetrograde  +-----------+--------+-----+--------+----------+----------+  PTA Distal 165                  biphasic              +-----------+--------+-----+--------+----------+----------+  PERO Distal53                   biphasic              +-----------+--------+-----+--------+----------+----------+     Left Graft #1: AK Pop - TPT BPG  +--------------------+--------+--------+--------+--------+                     PSV cm/sStenosisWaveformComments  +--------------------+--------+--------+--------+--------+  Inflow             120             biphasic          +--------------------+--------+--------+--------+--------+  Proximal Anastomosis78              biphasic          +--------------------+--------+--------+--------+--------+  Proximal Graft      50              biphasic          +--------------------+--------+--------+--------+--------+  Mid Graft           59              biphasic          +--------------------+--------+--------+--------+--------+  Distal Graft        87  biphasic          +--------------------+--------+--------+--------+--------+  Distal Anastomosis  78              biphasic          +--------------------+--------+--------+--------+--------+  Outflow            56              biphasic          +--------------------+--------+--------+--------+--------+     Summary:  Left:  Patent graft with no evidence of stenosis. The distal ATA appears  retrograde.  No significant change compared to prior 08/27/22.      ABI Findings:  +---------+------------------+-----+---------+--------+  Right   Rt Pressure (mmHg)IndexWaveform Comment   +---------+------------------+-----+---------+--------+  Brachial 133                                       +---------+------------------+-----+---------+--------+  PTA     166               1.25 triphasic           +---------+------------------+-----+---------+--------+  DP      160               1.20 triphasic          +---------+------------------+-----+---------+--------+  Great Toe66                0.50 Abnormal           +---------+------------------+-----+---------+--------+   +---------+------------------+-----+---------+-------+  Left    Lt Pressure (mmHg)IndexWaveform Comment  +---------+------------------+-----+---------+-------+  Brachial 125                                      +---------+------------------+-----+---------+-------+  PTA     153               1.15 triphasic         +---------+------------------+-----+---------+-------+  DP      155               1.17 triphasic         +---------+------------------+-----+---------+-------+  Great Toe67                0.50 Abnormal          +---------+------------------+-----+---------+-------+   +-------+-----------+-----------+------------+------------+  ABI/TBIToday's ABIToday's TBIPrevious ABIPrevious TBI  +-------+-----------+-----------+------------+------------+  Right 1.25       0.50       1.1         0.63          +-------+-----------+-----------+------------+------------+  Left  1.17       0.50       1.28        0.60          +-------+-----------+-----------+------------+------------+       Bilateral ABIs appear essentially unchanged.    Summary:  Right: Resting right ankle-brachial index is within normal range. The  right toe-brachial index is abnormal.   Left: Resting left ankle-brachial index is within normal range. The left  toe-brachial index is abnormal.    Abdominal Aorta Findings:  +-------------+-------+----------+----------+--------+--------+------------  ----+  Location    AP (cm)Trans (cm)PSV (cm/s)WaveformThrombusComments           +-------------+-------+----------+----------+--------+--------+------------  ----+  Proximal     1.90   2.00      51                                           +-------------+-------+----------+----------+--------+--------+------------  ----+  Mid         3.60   3.70      49                                           +-------------+-------+----------+----------+--------+--------+------------  ----+  RT CIA Mid   2.5    3.2       95                        Rt CIA  Aneurysm                                                           Sac 3.2 cm                                                                 (Prior 2.8  cm)    +-------------+-------+----------+----------+--------+--------+------------  ----+  RT EIA Distal1.0    1.1       125                                          +-------------+-------+----------+----------+--------+--------+------------  ----+  LT CIA Distal2.0    2.0       91                        Lt CIA  Aneurysm                                                           2.0 cm  (Prior                                                             2.0 cm)            +-------------+-------+----------+----------+--------+--------+------------  ----+  LT EIA Distal0.9    0.9       153                                          +-------------+-------+----------+----------+--------+--------+------------  ----+    Endovascular Aortic Repair (EVAR):  +----------+----------------+-------------------+-------------------+           Diameter AP (cm)Diameter Trans (cm)Velocities (cm/sec)  +----------+----------------+-------------------+-------------------+  Aorta    3.60            3.70  49                   +----------+----------------+-------------------+-------------------+  Right Limb1.30            1.30               75                   +----------+----------------+-------------------+-------------------+  Left Limb 1.20            1.20               102                   +----------+----------------+-------------------+-------------------+     Summary:  Stenosis:  - Patent EVAR with no evidence of endoleak with the largest aortic  measurement of 3.6 cm (Prior: 2.6 cm)  - Patent EVAR limbs.  - Slightly larger diameter of Right CIA aneurysm sac compared to prior.  (2.8 cm to 3.2 cm)  - Stable left CIA aneurysm compared to prior. (2.0 cm to 2.0 cm)   Note: Patient was non-fasting for exam.    ASSESSMENT/PLAN:   78 y.o. male here for follow up for PAD with hx of  left above-knee to below-knee popliteal artery bypass with GSV due to an occluded popliteal artery aneurysm by Dr. Darrick Penna on 11/22/2020.  Prior to that, he underwent right CFA endarterectomy with bovine patch angioplasty, right popliteal and tibial embolectomy with patch angioplasty and EVAR for repair of CIA aneurysm on 08/29/2020 by Dr. Darrick Penna.   He has a patent bypass with biphasic inflow wave forms.  He denies abdominal or lumbar pain out of the ordinary.  The ABI's are unchanged with falsely elevated index demonstrating calcified vessels.  EVAR graft without endo leak, right CIA 3.2 cm prior 2.6 cm.  Prior diameter before EVAR of right CFA was 4.4 cm.  We will follow this on next duplex.    He will continue to exercise daily.  I will schedule him for 1 year follow up with repeat studies.           Mosetta Pigeon PA-C [facility] [place] Office: [phone]  MD in clinic [place]

## 2023-10-24 NOTE — Progress Notes (Signed)
 [name] presents to the clinic for a one year follow up. He completed radiation treatment for malignant neoplasm of the supraglottis on 07/18/2022.   Pain issues, if any: Patient denies any throat pain. Using a feeding tube?: Patient denies. Weight changes, if any: Patient has gained 10 pounds, for breakfast he eats eggs, toast and bacon, lunch a sand which/soup, and meat including vegetables for dinner. Patient is staying hydrated. Swallowing issues, if any: Patient denies Smoking or chewing tobacco? Patient quit Using fluoride toothpaste daily? Dentures Last ENT visit was on: End of February with Dr. Jenne Pane Other notable issues, if any:  Patient denies    Patient is feeling well, and feels like he is healing since he completed his radiation treatment.  BP 123/66 (BP Location: Right Arm, Patient Position: Sitting, Cuff Size: Normal)   Pulse 63   Temp 97.6 F (36.4 C)   Resp 18   Ht 6\' 3"  (1.905 m)   Wt 207 lb 9.6 oz (94.2 kg)   SpO2 96%   BMI 25.95 kg/m    Wt Readings from Last 3 Encounters:  10/29/23 207 lb 9.6 oz (94.2 kg)  09/30/23 210 lb (95.3 kg)  07/23/23 202 lb (91.6 kg)

## 2023-10-29 ENCOUNTER — Ambulatory Visit
Admission: RE | Admit: 2023-10-29 | Discharge: 2023-10-29 | Discharge status: Home / Self Care | Payer: Medicare (Managed Care) | Source: Ambulatory Visit | Attending: Radiation Oncology | Admitting: Radiation Oncology

## 2023-10-29 VITALS — BP 123/66 | HR 63 | Temp 97.6°F | Resp 18 | Ht 75.0 in | Wt 207.6 lb

## 2023-10-29 DIAGNOSIS — Z7901 Long term (current) use of anticoagulants: Secondary | ICD-10-CM | POA: Insufficient documentation

## 2023-10-29 DIAGNOSIS — C321 Malignant neoplasm of supraglottis: Secondary | ICD-10-CM | POA: Insufficient documentation

## 2023-10-29 DIAGNOSIS — Z923 Personal history of irradiation: Secondary | ICD-10-CM | POA: Insufficient documentation

## 2023-10-29 DIAGNOSIS — Z87891 Personal history of nicotine dependence: Secondary | ICD-10-CM | POA: Insufficient documentation

## 2023-10-29 DIAGNOSIS — Z79899 Other long term (current) drug therapy: Secondary | ICD-10-CM | POA: Diagnosis not present

## 2023-10-29 NOTE — Progress Notes (Signed)
 Radiation Oncology         [phone] ________________________________  Name: Cory [name] MRN: [id number]  Date: 10/29/2023  DOB: 05-07-1946  Follow-Up Visit Note  CC: Kristian Covey, MD  Christia Reading, MD  Diagnosis and Prior Radiotherapy:       ICD-10-CM   1. Malignant neoplasm of supraglottis (HCC)  C32.1        CHIEF COMPLAINT:  Here for follow-up and surveillance of throat cancer  ==========DELIVERED PLANS==========  First Treatment Date: 2022-05-30 - Last Treatment Date: 2022-07-18   Plan Name: HN_supragl Site: Larynx Technique: IMRT Mode: Photon Dose Per Fraction: 2 Gy Prescribed Dose (Delivered / Prescribed): 70 Gy / 70 Gy Prescribed Fxs (Delivered / Prescribed): 35 / 35  Narrative:   Mr. Biel presents for follow up for completion of radiation for glottic cancer. He completed treatment on 07-18-22.   Pain issues, if any: Patient denies any throat pain. Using a feeding tube?: Patient denies. Weight changes, if any: Patient has gained 10 pounds, for breakfast he eats eggs, toast and bacon, lunch a sand which/soup, and meat including vegetables for dinner. Patient is staying hydrated. Swallowing issues, if any: Patient denies Smoking or chewing tobacco? Patient quit Using fluoride toothpaste daily? Dentures Last ENT visit was on: End of February with Dr. Jenne Pane Other notable issues, if any:  Patient denies  BP 123/66 (BP Location: Right Arm, Patient Position: Sitting, Cuff Size: Normal)   Pulse 63   Temp 97.6 F (36.4 C)   Resp 18   Ht 6\' 3"  (1.905 m)   Wt 207 lb 9.6 oz (94.2 kg)   SpO2 96%   BMI 25.95 kg/m     ALLERGIES:  is allergic to zithromax [azithromycin] and chlorpheniramine.  Meds: Current Outpatient Medications  Medication Sig Dispense Refill   acetaminophen (TYLENOL) 500 MG tablet Take 500 mg by mouth every 6 (six) hours as needed (for pain.).     cetirizine (ZYRTEC) 10 MG tablet Take 1 tablet (10 mg total) by mouth daily. (Patient  taking differently: Take 10 mg by mouth daily as needed for allergies.) 30 tablet 11   cholecalciferol (VITAMIN D) 25 MCG (1000 UNIT) tablet Take 1,000 Units by mouth 2 (two) times daily.     diltiazem (TIADYLT ER) 120 MG 24 hr capsule Take 1 capsule (120 mg total) by mouth daily. 90 capsule 3   fluticasone (FLONASE) 50 MCG/ACT nasal spray Place 2 sprays into both nostrils daily. 16 g 6   lidocaine-prilocaine (EMLA) cream Apply to affected area once 30 g 3   metoprolol tartrate (LOPRESSOR) 25 MG tablet Take 1 tablet by mouth twice daily as needed 180 tablet 0   nicotine (NICODERM CQ - DOSED IN MG/24 HOURS) 21 mg/24hr patch Place 1 patch (21 mg total) onto the skin daily. Apply 21 mg patch daily x 6 wk, then 14mg  patch daily x 2 wk, then 7 mg patch daily x 2 wk 14 patch 2   omeprazole (PRILOSEC) 40 MG capsule Take 1 capsule (40 mg total) by mouth daily. 30 capsule 6   rivaroxaban (XARELTO) 20 MG TABS tablet Take 1 tablet (20 mg total) by mouth daily with supper. 90 tablet 1   rosuvastatin (CRESTOR) 10 MG tablet Take 1 tablet by mouth once daily 30 tablet 0   sodium chloride (OCEAN) 0.65 % SOLN nasal spray Place 1 spray into both nostrils as needed for congestion.     No current facility-administered medications for this encounter.    Physical  Findings: The patient is in no acute distress. Patient is alert and oriented. Wt Readings from Last 3 Encounters:  10/29/23 207 lb 9.6 oz (94.2 kg)  09/30/23 210 lb (95.3 kg)  07/23/23 202 lb (91.6 kg)    height is 6\' 3"  (1.905 m) and weight is 207 lb 9.6 oz (94.2 kg). His temperature is 97.6 F (36.4 C). His blood pressure is 123/66 and his pulse is 63. His respiration is 18 and oxygen saturation is 96%. .  General: Alert and oriented, in no acute distress HEENT: Head is normocephalic. Extraocular movements are intact. Oropharynx is notable for mucositis on the tongue, no concerning lesions. Neck: Neck is notable for no masses, skin intact. Small cyst  at the base of left neck.  Skin: Skin in treatment fields shows satisfactory healing over neck Lymphatics: see Neck Exam Cardiopulmon: Chronic systolic murmur auscultated. Lungs CTA in all lung fields.  Psychiatric: Judgment and insight are intact. Affect is appropriate.  Lab Findings: Lab Results  Component Value Date   WBC 4.8 09/17/2023   HGB 14.9 09/17/2023   HCT 44.6 09/17/2023   MCV 98.9 09/17/2023   PLT 170 09/17/2023    Lab Results  Component Value Date   TSH 1.60 01/23/2023    Radiographic Findings: VAS [place] EVAR DUPLEX Result Date: 09/30/2023 Endovascular Aortic Repair Study (EVAR) Patient Name:  ANTHONE PRIEUR  Date of Exam:   09/30/2023 Medical Rec #: [id number]      Accession #:    [id number] Date of Birth: 78 years old      Patient Gender: M Patient Age:   78 years Exam Location:  [facility] Procedure:      VAS [place] EVAR DUPLEX Referring Phys: Coral Else --------------------------------------------------------------------------------  Indications: Follow up exam for EVAR. Risk Factors: Hypertension, past history of smoking. Vascular Interventions: 4/19/ 22; Left AK pop to TPT bypass for aneurysm.                         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 Limitations: Pt non-fasting and air/bowel gas.  Comparison Study: 08/27/2022                   Summary: Abdominal Aorta: Patent endovascular aneurysm repair                   with no evidence of endoleak. No significant change in the                   aortic measurement. Right CIA measurement has decreased. Performing Technologist: Criss Rosales RVT  Examination Guidelines: A complete evaluation includes B-mode imaging, spectral Doppler, color Doppler, and power Doppler as needed of all accessible portions of each vessel. Bilateral testing is considered an integral part of a complete examination. Limited examinations for reoccurring indications may be performed as noted.  Abdominal Aorta Findings:  +-------------+-------+----------+----------+--------+--------+----------------+ Location     AP (cm)Trans (cm)PSV (cm/s)WaveformThrombusComments         +-------------+-------+----------+----------+--------+--------+----------------+ Proximal     1.90   2.00      51                                         +-------------+-------+----------+----------+--------+--------+----------------+ Mid          3.60   3.70      49                                         +-------------+-------+----------+----------+--------+--------+----------------+  RT CIA Mid   2.5    3.2       95                        Rt CIA Aneurysm                                                          Sac 3.2 cm                                                               (Prior 2.8 cm)   +-------------+-------+----------+----------+--------+--------+----------------+ RT EIA Distal1.0    1.1       125                                        +-------------+-------+----------+----------+--------+--------+----------------+ LT CIA Distal2.0    2.0       91                        Lt CIA Aneurysm                                                          2.0 cm (Prior                                                            2.0 cm)          +-------------+-------+----------+----------+--------+--------+----------------+ LT EIA Distal0.9    0.9       153                                        +-------------+-------+----------+----------+--------+--------+----------------+ Endovascular Aortic Repair (EVAR): +----------+----------------+-------------------+-------------------+           Diameter AP (cm)Diameter Trans (cm)Velocities (cm/sec) +----------+----------------+-------------------+-------------------+ Aorta     3.60            3.70               49                  +----------+----------------+-------------------+-------------------+ Right Limb1.30             1.30               75                  +----------+----------------+-------------------+-------------------+ Left Limb 1.20            1.20  102                 +----------+----------------+-------------------+-------------------+  Summary: Stenosis: - Patent EVAR with no evidence of endoleak with the largest aortic measurement of 3.6 cm (Prior: 2.6 cm) - Patent EVAR limbs. - Slightly larger diameter of Right CIA aneurysm sac compared to prior. (2.8 cm to 3.2 cm) - Stable left CIA aneurysm compared to prior. (2.0 cm to 2.0 cm) Note: Patient was non-fasting for exam.  *See table(s) above for measurements and observations.  Electronically signed by Coral Else MD on 09/30/2023 at 4:11:04 PM.    Final    VAS [place] ABI WITH/WO TBI Result Date: 09/30/2023  LOWER EXTREMITY DOPPLER STUDY Patient Name:  BRANDT CHANEY  Date of Exam:   09/30/2023 Medical Rec #: [id number]      Accession #:    [id number] Date of Birth: 22-Sep-1945      Patient Gender: M Patient Age:   62 years Exam Location:  [facility] Procedure:      VAS [place] ABI WITH/WO TBI Referring Phys: Coral Else --------------------------------------------------------------------------------  Indications: Peripheral artery disease. Left AK Pop- TPT BPG High Risk Factors: Hypertension, past history of smoking.  Vascular Interventions: 11/22/20; Left AK pop to TPT bypass for aneurysm.                         08/29/20: EVAR for AAA and right CIA aneurysm. Comparison Study: 08/27/2022                   Right: Resting right ankle- brachial index is within normal                   range. The right toe- brachial index is abnormal.                    Left: Resting left ankle- brachial index is within normal                   range. Pedal pressures may be falsely elevated. The left toe-                   brachial index is abnormal. Performing Technologist: Criss Rosales RVT  Examination Guidelines: A complete evaluation includes at minimum,  Doppler waveform signals and systolic blood pressure reading at the level of bilateral brachial, anterior tibial, and posterior tibial arteries, when vessel segments are accessible. Bilateral testing is considered an integral part of a complete examination. Photoelectric Plethysmograph (PPG) waveforms and toe systolic pressure readings are included as required and additional duplex testing as needed. Limited examinations for reoccurring indications may be performed as noted.  ABI Findings: +---------+------------------+-----+---------+--------+ Right    Rt Pressure (mmHg)IndexWaveform Comment  +---------+------------------+-----+---------+--------+ Brachial 133                                      +---------+------------------+-----+---------+--------+ PTA      166               1.25 triphasic         +---------+------------------+-----+---------+--------+ DP       160               1.20 triphasic         +---------+------------------+-----+---------+--------+ Janifer Adie  0.50 Abnormal          +---------+------------------+-----+---------+--------+ +---------+------------------+-----+---------+-------+ Left     Lt Pressure (mmHg)IndexWaveform Comment +---------+------------------+-----+---------+-------+ Brachial 125                                     +---------+------------------+-----+---------+-------+ PTA      153               1.15 triphasic        +---------+------------------+-----+---------+-------+ DP       155               1.17 triphasic        +---------+------------------+-----+---------+-------+ Great Toe67                0.50 Abnormal         +---------+------------------+-----+---------+-------+ +-------+-----------+-----------+------------+------------+ ABI/TBIToday's ABIToday's TBIPrevious ABIPrevious TBI +-------+-----------+-----------+------------+------------+ Right  1.25       0.50       1.1         0.63          +-------+-----------+-----------+------------+------------+ Left   1.17       0.50       1.28        0.60         +-------+-----------+-----------+------------+------------+  Bilateral ABIs appear essentially unchanged.  Summary: Right: Resting right ankle-brachial index is within normal range. The right toe-brachial index is abnormal. Left: Resting left ankle-brachial index is within normal range. The left toe-brachial index is abnormal. *See table(s) above for measurements and observations.  Electronically signed by Coral Else MD on 09/30/2023 at 4:10:28 PM.    Final    VAS [place] LOWER EXTREMITY BYPASS GRAFT DUPLEX Result Date: 09/30/2023 LOWER EXTREMITY ARTERIAL DUPLEX STUDY Patient Name:  DARVIN DIALS  Date of Exam:   09/30/2023 Medical Rec #: [id number]      Accession #:    [id number] Date of Birth: 19-Oct-1945      Patient Gender: M Patient Age:   4 years Exam Location:  [facility] Procedure:      VAS [place] LOWER EXTREMITY BYPASS GRAFT DUPLEX Referring Phys: Coral Else --------------------------------------------------------------------------------  Indications: Peripheral artery disease. High Risk Factors: Hypertension, current smoker.  Vascular Interventions: 11/22/20; Left AK pop to TPT bypass for aneurysm.                         08/29/20: EVAR for AAA and right CIA aneurysm. Current ABI:            Right: 1.25                         Left: 1.17 Limitations: Left AK Pop - TPT BPG Comparison Study: 08/27/2022                   Summary: Left: Patent graft with no stenosis seen. The distal                   ATA appears retrograde. Performing Technologist: Criss Rosales RVT  Examination Guidelines: A complete evaluation includes B-mode imaging, spectral Doppler, color Doppler, and power Doppler as needed of all accessible portions of each vessel. Bilateral testing is considered an integral part of a complete examination. Limited examinations for reoccurring indications may be  performed as noted  +-----------+--------+-----+--------+----------+----------+ LEFT       PSV cm/sRatioStenosisWaveform  Comments   +-----------+--------+-----+--------+----------+----------+ CFA Distal 93                   biphasic             +-----------+--------+-----+--------+----------+----------+ DFA        100                  biphasic             +-----------+--------+-----+--------+----------+----------+ SFA Prox   90                   biphasic             +-----------+--------+-----+--------+----------+----------+ SFA Mid    78                   biphasic             +-----------+--------+-----+--------+----------+----------+ ATA Distal 33                   monophasicRetrograde +-----------+--------+-----+--------+----------+----------+ PTA Distal 165                  biphasic             +-----------+--------+-----+--------+----------+----------+ PERO Distal53                   biphasic             +-----------+--------+-----+--------+----------+----------+  Left Graft #1: AK Pop - TPT BPG +--------------------+--------+--------+--------+--------+                     PSV cm/sStenosisWaveformComments +--------------------+--------+--------+--------+--------+ Inflow              120             biphasic         +--------------------+--------+--------+--------+--------+ Proximal Anastomosis78              biphasic         +--------------------+--------+--------+--------+--------+ Proximal Graft      50              biphasic         +--------------------+--------+--------+--------+--------+ Mid Graft           59              biphasic         +--------------------+--------+--------+--------+--------+ Distal Graft        87              biphasic         +--------------------+--------+--------+--------+--------+ Distal Anastomosis  78              biphasic          +--------------------+--------+--------+--------+--------+ Outflow             56              biphasic         +--------------------+--------+--------+--------+--------+  Summary: Left: Patent graft with no evidence of stenosis. The distal ATA appears retrograde. No significant change compared to prior 08/27/22.  See table(s) above for measurements and observations. Electronically signed by Coral Else MD on 09/30/2023 at 4:09:27 PM.    Final     Impression/Plan:    1) Head and Neck Cancer Status: Patient has healed well from his radiation treatment.   2) Nutritional Status: Stable weight Wt Readings from Last 3 Encounters:  10/29/23 207 lb 9.6 oz (94.2 kg)  09/30/23 210 lb (95.3 kg)  07/23/23 202 lb (91.6 kg)    3) Risk Factors: The patient has been educated about risk factors including alcohol and tobacco abuse; they understand that avoidance of alcohol and tobacco is important to prevent recurrences as well as other cancers.  He is no longer smoking and I have congratulated him on his efforts.   4) Swallowing: Good function  5) Dental: Encouraged to continue regular followup with dentistry, and dental hygiene including fluoride rinses.  6) Thyroid function: Check annually with PCP - Dr. Caryl Never and I corresponded and he is happy to manage this Lab Results  Component Value Date   TSH 1.60 01/23/2023   7)   I reached out to Dr. Burnice Logan Iruku by message as she is scheduled to see him in June.  I asked our nurse navigator to order a CT of the neck and chest to be done prior to this visit so she can review the results with him.   He is scheduled to see Dr. Jenne Pane in September.   I will see him back in 1 year.  On date of service, in total, I spent 30 minutes on this encounter. Patient was seen in person. _____________________________________    Bryan Lemma, PA-C

## 2023-10-30 ENCOUNTER — Other Ambulatory Visit: Payer: Self-pay

## 2023-10-31 ENCOUNTER — Other Ambulatory Visit: Payer: Self-pay

## 2023-10-31 DIAGNOSIS — C321 Malignant neoplasm of supraglottis: Secondary | ICD-10-CM

## 2024-01-02 ENCOUNTER — Encounter: Payer: Self-pay | Admitting: Hematology and Oncology

## 2024-01-03 ENCOUNTER — Ambulatory Visit (HOSPITAL_BASED_OUTPATIENT_CLINIC_OR_DEPARTMENT_OTHER)
Admission: RE | Admit: 2024-01-03 | Discharge: 2024-01-03 | Disposition: A | Discharge status: Home / Self Care | Payer: Medicare (Managed Care) | Source: Ambulatory Visit | Attending: Hematology and Oncology | Admitting: Hematology and Oncology

## 2024-01-03 DIAGNOSIS — C321 Malignant neoplasm of supraglottis: Secondary | ICD-10-CM | POA: Insufficient documentation

## 2024-01-03 MED ORDER — IOHEXOL 300 MG/ML  SOLN
75.0000 mL | Freq: Once | INTRAMUSCULAR | Status: AC | PRN
Start: 2024-01-03 — End: 2024-01-03
  Administered 2024-01-03: 75 mL via INTRAVENOUS

## 2024-01-09 ENCOUNTER — Ambulatory Visit (HOSPITAL_BASED_OUTPATIENT_CLINIC_OR_DEPARTMENT_OTHER)
Admission: RE | Admit: 2024-01-09 | Discharge: 2024-01-09 | Disposition: A | Discharge status: Home / Self Care | Payer: Medicare (Managed Care) | Source: Ambulatory Visit | Attending: Cardiology | Admitting: Cardiology

## 2024-01-09 DIAGNOSIS — I350 Nonrheumatic aortic (valve) stenosis: Secondary | ICD-10-CM | POA: Diagnosis not present

## 2024-01-09 MED ORDER — IOHEXOL 350 MG/ML SOLN
100.0000 mL | Freq: Once | INTRAVENOUS | Status: AC | PRN
  Administered 2024-01-09: 100 mL via INTRAVENOUS

## 2024-01-10 LAB — POCT I-STAT CREATININE: Creatinine, Ser: 1.1 mg/dL (ref 0.61–1.24)

## 2024-01-12 ENCOUNTER — Ambulatory Visit: Payer: Self-pay | Admitting: Cardiology

## 2024-01-12 DIAGNOSIS — I714 Abdominal aortic aneurysm, without rupture, unspecified: Secondary | ICD-10-CM

## 2024-01-14 ENCOUNTER — Telehealth: Payer: Self-pay

## 2024-01-14 NOTE — Telephone Encounter (Signed)
 Left a message to confirm appt for 6/11

## 2024-01-15 ENCOUNTER — Inpatient Hospital Stay (HOSPITAL_BASED_OUTPATIENT_CLINIC_OR_DEPARTMENT_OTHER): Payer: Medicare (Managed Care) | Admitting: Hematology and Oncology

## 2024-01-15 ENCOUNTER — Other Ambulatory Visit: Payer: Self-pay | Admitting: Cardiology

## 2024-01-15 ENCOUNTER — Inpatient Hospital Stay: Payer: Medicare (Managed Care) | Attending: Hematology and Oncology

## 2024-01-15 ENCOUNTER — Other Ambulatory Visit: Payer: Self-pay | Admitting: *Deleted

## 2024-01-15 VITALS — BP 140/74 | HR 61 | Temp 98.5°F | Resp 17 | Wt 211.2 lb

## 2024-01-15 DIAGNOSIS — R918 Other nonspecific abnormal finding of lung field: Secondary | ICD-10-CM

## 2024-01-15 DIAGNOSIS — C321 Malignant neoplasm of supraglottis: Secondary | ICD-10-CM

## 2024-01-15 DIAGNOSIS — D479 Neoplasm of uncertain behavior of lymphoid, hematopoietic and related tissue, unspecified: Secondary | ICD-10-CM

## 2024-01-15 LAB — CBC WITH DIFFERENTIAL/PLATELET
Abs Immature Granulocytes: 0.02 10*3/uL (ref 0.00–0.07)
Basophils Absolute: 0 10*3/uL (ref 0.0–0.1)
Basophils Relative: 1 %
Eosinophils Absolute: 0.2 10*3/uL (ref 0.0–0.5)
Eosinophils Relative: 5 %
HCT: 46.1 % (ref 39.0–52.0)
Hemoglobin: 15.9 g/dL (ref 13.0–17.0)
Immature Granulocytes: 0 %
Lymphocytes Relative: 21 %
Lymphs Abs: 1 10*3/uL (ref 0.7–4.0)
MCH: 33.5 pg (ref 26.0–34.0)
MCHC: 34.5 g/dL (ref 30.0–36.0)
MCV: 97.3 fL (ref 80.0–100.0)
Monocytes Absolute: 0.6 10*3/uL (ref 0.1–1.0)
Monocytes Relative: 13 %
Neutro Abs: 2.8 10*3/uL (ref 1.7–7.7)
Neutrophils Relative %: 60 %
Platelets: 159 10*3/uL (ref 150–400)
RBC: 4.74 MIL/uL (ref 4.22–5.81)
RDW: 12.8 % (ref 11.5–15.5)
WBC: 4.7 10*3/uL (ref 4.0–10.5)
nRBC: 0 % (ref 0.0–0.2)

## 2024-01-15 LAB — LACTATE DEHYDROGENASE: LDH: 117 U/L (ref 98–192)

## 2024-01-15 NOTE — Progress Notes (Signed)
  Cancer Center CONSULT NOTE  Patient Care Team: Marquetta Sit, MD as PCP - General Eilleen Grates, MD as PCP - Cardiology (Cardiology) Alver Austin, [facility] (Inactive) as Pharmacist (Pharmacist) Malmfelt, Nancyann Aye, RN as Oncology Nurse Navigator Colie Dawes, MD as Consulting Physician (Radiation Oncology) Murleen Arms, MD as Consulting Physician (Hematology and Oncology)  CHIEF COMPLAINTS/PURPOSE OF CONSULTATION:  SCC supraglottis  ONCOLOGIC HISTORY: Initially presented to ENT with chronic left-sided sore throat for almost a year.  Initial evaluation did not show any possible causes for sore throat.  He was recommended PPI twice daily but went back to ENT with persistent sore throat he once again had a repeat fiberoptic exam which was stable.   CT neck done on November 08, 2021 did show subcentimeter focus of mucosal hyperenhancement along the posterior aspect of the uvula  laryngoscopy with direct visualization recommended to exclude a mucosal lesion at the site.  Additionally few tiny cystic-appearing foci along the left glossotonsillar sulcus.  Direct visualization recommended.  Nonspecific mildly enlarged right level 2 lymph node.  Multiple small calcific foci in the left palatine tonsil.Repeat fiberoptic exam was unremarkable hence Dr. 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  Tongue biopsy showed benign squamous mucosa with low to intermediate grade dysplasia. 05/07/2022: PET/CT showed asymmetric hypermetabolism in the left oropharynx and involving the left epiglottis.  Contralateral hypermetabolic level 2 cervical lymph nodes compatible with metastatic disease.  No hypermetabolic adenopathy in the left neck or in the chest.  1.2 cm short axis aortocaval lymph node in the upper abdomen is  hypermetabolic metastatic disease is a concern.  This lymph node has increased from 0.7 cm on the study from 2022.  No other metastatic disease. Received radiation from 05/30/2022-07/18/2022 and weekly cisplatin  from 06/01/2022-07/18/2022.   INTERIM HISTORY:  Cory [name] returns for a follow up after completion of chemoradiation on 07/18/2022.   Discussed the use of AI scribe software for clinical note transcription with the patient, who gave verbal consent to proceed.   Discussed the use of AI scribe software for clinical note transcription with the patient, who gave verbal consent to proceed.  History of Present Illness Cory [name] is a 78 year old male with a history of head and neck cancer who presents with lung nodules. Regarding his lymphoproliferative disorder, his abdominal lymph node was biopsied twice previously without a definitive diagnosis.   He was informed of lung nodules identified on a recent CT scan. No new symptoms such as chest pain, shortness of breath, or cough. No hoarseness, speech difficulties, or neck pain. He feels generally well but notes a slight increase in fatigue, which he attributes to allergies.  He confirms that he quit smoking and last saw his ear, nose, and throat doctor in February, who was satisfied with his condition at that time. No issues with eating, bowel movements, or urination. No fevers or night sweats.   Rest of the pertinent 10 point ROS reviewed and neg.  Wt Readings from Last 3 Encounters:  01/15/24 211 lb 3.2 oz (95.8 kg)  10/29/23 207 lb 9.6 oz (94.2 kg)  09/30/23 210 lb (95.3 kg)    MEDICAL HISTORY:  Past Medical History:  Diagnosis Date   AAA (abdominal aortic aneurysm) (HCC)    Aortic stenosis    a. Moderate by echo 05/2020.   Arthritis  right hip   Cancer (HCC)    skin cancer nose, right left, upper left basel cell   Dysrhythmia    a-fib   Malignant neoplasm of supraglottis (HCC) 05/2022   PAF (paroxysmal  atrial fibrillation) (HCC)    Peripheral arterial disease (HCC)    Tobacco abuse     SURGICAL HISTORY: Past Surgical History:  Procedure Laterality Date   ABDOMINAL AORTIC ENDOVASCULAR STENT GRAFT N/A 08/29/2020   Procedure: ABDOMINAL AORTIC ENDOVASCULAR STENT GRAFT;  Surgeon: Richrd Char, MD;  Location: [facility] OR;  Service: Vascular;  Laterality: N/A;   ABDOMINAL AORTOGRAM W/LOWER EXTREMITY Bilateral 06/24/2020   Procedure: ABDOMINAL AORTOGRAM W/LOWER EXTREMITY;  Surgeon: Richrd Char, MD;  Location: [facility] INVASIVE CV LAB;  Service: Cardiovascular;  Laterality: Bilateral;   BACK SURGERY     2005   BIOPSY  05/21/2022   Procedure: BIOPSY;  Surgeon: Brice Campi Albino Alu., MD;  Location: [facility];  Service: Gastroenterology;;   CERVICAL SPINE SURGERY     DIRECT LARYNGOSCOPY Bilateral 04/18/2022   Procedure: DIRECT LARYNGOSCOPY WITH BIOPSIES;  Surgeon: Virgina Grills, MD;  Location: [facility] OR;  Service: ENT;  Laterality: Bilateral;   EMBOLECTOMY Right 08/29/2020   Procedure: POPLITEAL AND TIBIAL EMBOLECTOMY;  Surgeon: Richrd Char, MD;  Location: [facility] OR;  Service: Vascular;  Laterality: Right;   EMBOLIZATION (CATH LAB) Right 06/24/2020   Procedure: EMBOLIZATION;  Surgeon: Richrd Char, MD;  Location: [facility] INVASIVE CV LAB;  Service: Cardiovascular;  Laterality: Right;   ENDARTERECTOMY FEMORAL Right 08/29/2020   Procedure: ENDARTERECTOMY FEMORAL;  Surgeon: Richrd Char, MD;  Location: [facility] OR;  Service: Vascular;  Laterality: Right;   ESOPHAGOGASTRODUODENOSCOPY N/A 06/27/2022   Procedure: ESOPHAGOGASTRODUODENOSCOPY (EGD);  Surgeon: Normie Becton., MD;  Location: [facility];  Service: Gastroenterology;  Laterality: N/A;   ESOPHAGOGASTRODUODENOSCOPY (EGD) WITH PROPOFOL  N/A 05/21/2022   Procedure: ESOPHAGOGASTRODUODENOSCOPY (EGD) WITH PROPOFOL ;  Surgeon: Brice Campi Albino Alu., MD;  Location: [facility];  Service: Gastroenterology;  Laterality: N/A;   EUS N/A 05/21/2022    Procedure: UPPER ENDOSCOPIC ULTRASOUND (EUS) RADIAL;  Surgeon: Normie Becton., MD;  Location: [facility]COPY;  Service: Gastroenterology;  Laterality: N/A;   EUS N/A 06/27/2022   Procedure: UPPER ENDOSCOPIC ULTRASOUND (EUS) RADIAL;  Surgeon: Normie Becton., MD;  Location: [facility];  Service: Gastroenterology;  Laterality: N/A;   EYE SURGERY     FEMORAL ARTERY EXPLORATION Right 06/25/2020   Procedure: RIGHT GROIN EXPLORATION Evacuation of Hematoma  WITH REPAIR OF RIGHT Common FEMORAL ARTERY.;  Surgeon: Dannis Dy, MD;  Location: [facility] OR;  Service: Vascular;  Laterality: Right;   FEMORAL-POPLITEAL BYPASS GRAFT Left 11/22/2020   Procedure: LEFT ABOVE KNEE-BELOW KNEE BYPASS TIBIAL PERITONEAL TRUNK REVERSE IPSILATERAL GREATER SAPHENOUS VEIN;  Surgeon: Richrd Char, MD;  Location: [facility] OR;  Service: Vascular;  Laterality: Left;   FINE NEEDLE ASPIRATION N/A 05/21/2022   Procedure: FINE NEEDLE ASPIRATION (FNA) LINEAR;  Surgeon: Normie Becton., MD;  Location: [facility];  Service: Gastroenterology;  Laterality: N/A;   FINE NEEDLE ASPIRATION  06/27/2022   Procedure: FINE NEEDLE ASPIRATION;  Surgeon: Brice Campi Albino Alu., MD;  Location: [facility];  Service: Gastroenterology;;   HOT HEMOSTASIS N/A 06/27/2022   Procedure: HOT HEMOSTASIS (ARGON PLASMA COAGULATION/BICAP);  Surgeon: Normie Becton., MD;  Location: [facility];  Service: Gastroenterology;  Laterality: N/A;   IR GASTROSTOMY TUBE MOD SED  06/12/2022   IR GASTROSTOMY TUBE REMOVAL  09/04/2022   IR IMAGING GUIDED PORT INSERTION  06/12/2022   IR REMOVAL  TUN ACCESS W/ PORT W/O FL MOD SED  11/21/2022   LEG SURGERY     Trauma   LOWER EXTREMITY ANGIOGRAPHY Left 11/17/2020   Procedure: Lower Extremity Angiography;  Surgeon: Young Hensen, MD;  Location: [facility] INVASIVE CV LAB;  Service: Cardiovascular;  Laterality: Left;   PATCH ANGIOPLASTY Right 08/29/2020   Procedure: PATCH ANGIOPLASTY RIGHT  BELOW KNEE POPLITEAL ARTERY AND RIGHT COMMON FEMORAL ARTERY;  Surgeon: Richrd Char, MD;  Location: [facility] OR;  Service: Vascular;  Laterality: Right;   RETINAL DETACHMENT SURGERY     RIGID ESOPHAGOSCOPY  04/18/2022   Procedure: RIGID ESOPHAGOSCOPY;  Surgeon: Virgina Grills, MD;  Location: [facility] OR;  Service: ENT;;   TONSILLECTOMY  04/18/2022   Procedure: LEFT TONSILLECTOMY;  Surgeon: Virgina Grills, MD;  Location: [facility] OR;  Service: ENT;;   ULTRASOUND GUIDANCE FOR VASCULAR ACCESS Bilateral 08/29/2020   Procedure: ULTRASOUND GUIDANCE FOR VASCULAR ACCESS;  Surgeon: Richrd Char, MD;  Location: [facility] OR;  Service: Vascular;  Laterality: Bilateral;    SOCIAL HISTORY: Social History   Socioeconomic History   Marital status: Married    Spouse name: Not on file   Number of children: 5   Years of education: Not on file   Highest education level: Master's degree (e.g., MA, MS, MEng, MEd, MSW, MBA)  Occupational History   Not on file  Tobacco Use   Smoking status: Every Day    Current packs/day: 0.50    Average packs/day: 0.5 packs/day for 50.0 years (25.0 ttl pk-yrs)    Types: Cigarettes    Passive exposure: Never   Smokeless tobacco: Never  Vaping Use   Vaping status: Never Used  Substance and Sexual Activity   Alcohol use: Yes    Alcohol/week: 0.0 standard drinks of alcohol    Comment: 4 to 5 times a week; beer, wine and liquor    Drug use: No   Sexual activity: Not Currently  Other Topics Concern   Not on file  Social History Narrative   Scores reading tests.  Lives with wife.     Social Drivers of Corporate investment banker Strain: Low Risk  (07/23/2023)   Overall Financial Resource Strain (CARDIA)    Difficulty of Paying Living Expenses: Not hard at all  Food Insecurity: No Food Insecurity (07/23/2023)   Hunger Vital Sign    Worried About Running Out of Food in the Last Year: Never true    Ran Out of Food in the Last Year: Never true  Transportation Needs: No Transportation  Needs (07/23/2023)   PRAPARE - Administrator, Civil Service (Medical): No    Lack of Transportation (Non-Medical): No  Physical Activity: Sufficiently Active (07/23/2023)   Exercise Vital Sign    Days of Exercise per Week: 5 days    Minutes of Exercise per Session: 30 min  Recent Concern: Physical Activity - Insufficiently Active (05/06/2023)   Exercise Vital Sign    Days of Exercise per Week: 5 days    Minutes of Exercise per Session: 20 min  Stress: No Stress Concern Present (07/23/2023)   [employer] of Occupational Health - Occupational Stress Questionnaire    Feeling of Stress : Not at all  Social Connections: Moderately Integrated (07/23/2023)   Social Connection and Isolation Panel [NHANES]    Frequency of Communication with Friends and Family: More than three times a week    Frequency of Social Gatherings with Friends and Family: Three times a week    Attends  Religious Services: Never    Active Member of Clubs or Organizations: Yes    Attends Banker Meetings: 1 to 4 times per year    Marital Status: Married  Recent Concern: Social Connections - Moderately Isolated (05/06/2023)   Social Connection and Isolation Panel [NHANES]    Frequency of Communication with Friends and Family: More than three times a week    Frequency of Social Gatherings with Friends and Family: More than three times a week    Attends Religious Services: Never    Database administrator or Organizations: No    Attends Banker Meetings: Never    Marital Status: Married  Catering manager Violence: Not At Risk (02/25/2023)   Humiliation, Afraid, Rape, and Kick questionnaire    Fear of Current or Ex-Partner: No    Emotionally Abused: No    Physically Abused: No    Sexually Abused: No    FAMILY HISTORY: Family History  Problem Relation Age of Onset   Ovarian cancer Mother    Alcoholism Father    Breast cancer Sister    Ovarian cancer Sister     ALLERGIES:   is allergic to zithromax  [azithromycin ] and chlorpheniramine.  MEDICATIONS:  Current Outpatient Medications  Medication Sig Dispense Refill   acetaminophen  (TYLENOL ) 500 MG tablet Take 500 mg by mouth every 6 (six) hours as needed (for pain.).     cetirizine  (ZYRTEC ) 10 MG tablet Take 1 tablet (10 mg total) by mouth daily. (Patient taking differently: Take 10 mg by mouth daily as needed for allergies.) 30 tablet 11   cholecalciferol  (VITAMIN D ) 25 MCG (1000 UNIT) tablet Take 1,000 Units by mouth 2 (two) times daily.     diltiazem  (TIADYLT  ER) 120 MG 24 hr capsule Take 1 capsule (120 mg total) by mouth daily. 90 capsule 3   fluticasone  (FLONASE ) 50 MCG/ACT nasal spray Place 2 sprays into both nostrils daily. 16 g 6   lidocaine -prilocaine  (EMLA ) cream Apply to affected area once 30 g 3   metoprolol  tartrate (LOPRESSOR ) 25 MG tablet Take 1 tablet by mouth twice daily as needed 180 tablet 0   nicotine  (NICODERM CQ  - DOSED IN MG/24 HOURS) 21 mg/24hr patch Place 1 patch (21 mg total) onto the skin daily. Apply 21 mg patch daily x 6 wk, then 14mg  patch daily x 2 wk, then 7 mg patch daily x 2 wk 14 patch 2   omeprazole  (PRILOSEC) 40 MG capsule Take 1 capsule (40 mg total) by mouth daily. 30 capsule 6   rivaroxaban  (XARELTO ) 20 MG TABS tablet Take 1 tablet (20 mg total) by mouth daily with supper. 90 tablet 1   rosuvastatin  (CRESTOR ) 10 MG tablet Take 1 tablet by mouth once daily 30 tablet 0   sodium chloride  (OCEAN) 0.65 % SOLN nasal spray Place 1 spray into both nostrils as needed for congestion.     No current facility-administered medications for this visit.     PHYSICAL EXAMINATION: ECOG PERFORMANCE STATUS: 1 - Symptomatic but completely ambulatory  Vitals:   01/15/24 1358  BP: (!) 140/74  Pulse: 61  Resp: 17  Temp: 98.5 F (36.9 C)  SpO2: 97%    Filed Weights   01/15/24 1358  Weight: 211 lb 3.2 oz (95.8 kg)     Physical Exam Constitutional:      Appearance: Normal appearance.   Cardiovascular:     Rate and Rhythm: Normal rate and regular rhythm.  Pulmonary:     Effort: Pulmonary effort  is normal.     Breath sounds: Normal breath sounds.  Abdominal:     Comments:    Musculoskeletal:        General: No swelling or tenderness. Normal range of motion.     Cervical back: Normal range of motion and neck supple. No rigidity.  Lymphadenopathy:     Cervical: No cervical adenopathy.  Skin:    General: Skin is warm and dry.  Neurological:     General: No focal deficit present.     Mental Status: He is alert.      LABORATORY DATA:  I have reviewed the data as listed Lab Results  Component Value Date   WBC 4.7 01/15/2024   HGB 15.9 01/15/2024   HCT 46.1 01/15/2024   MCV 97.3 01/15/2024   PLT 159 01/15/2024     Chemistry      Component Value Date/Time   NA 136 09/17/2023 1332   NA 141 05/16/2018 1425   K 4.3 09/17/2023 1332   CL 102 09/17/2023 1332   CO2 31 09/17/2023 1332   BUN 15 09/17/2023 1332   BUN 13 05/16/2018 1425   CREATININE 1.10 01/03/2024 0944   CREATININE 0.91 09/17/2023 1332      Component Value Date/Time   CALCIUM  9.1 09/17/2023 1332   ALKPHOS 36 (L) 09/17/2023 1332   AST 14 (L) 09/17/2023 1332   ALT 14 09/17/2023 1332   BILITOT 0.4 09/17/2023 1332       RADIOGRAPHIC STUDIES: I have personally reviewed the radiological images as listed and agreed with the findings in the report. CT ANGIO CHEST AORTA W &/OR WO CONTRAST Result Date: 01/09/2024 EXAMINATION: CT ANGIO CHEST AORTA W & OR WO CONTRAST CLINICAL INDICATION: Male, 78 years old. enalrged ascending aorta TECHNIQUE: Axial CTA of the chest with 100 cc Omnipaque  300 intravenous contrast. Multiplanar and 3D reformations provided. Unless otherwise specified, incidental thyroid , adrenal, renal lesions do not require dedicated imaging follow up. Additionally, any mentioned pulmonary nodules do not require dedicated imaging follow-up based on the Fleischner guidelines unless otherwise  specified. Coronary calcifications are not identified unless otherwise specified. COMPARISON: 01/03/2024, 01/23/2023 FINDINGS: The ascending thoracic aorta remains dilated measuring 4.1 cm, which is unchanged when measured in similar fashion on the prior examinations. The aortic arch and descending thoracic aorta are normal in caliber. Scattered atherosclerotic changes are present. The origins of the great vessels appear patent. The visualized thyroid  is normal. The main pulmonary artery is normal in caliber. The heart is normal in size. There is no free fluid or pathologic lymphadenopathy by size criteria. The visualized upper abdominal structures appear within normal limits. The trachea and mainstem bronchi are patent. Subsegmental atelectatic changes are noted within the lung bases. There is moderate centrilobular/paraseptal pulmonary emphysema. Stable pulmonary nodules are noted bilaterally with the largest measuring 7 mm in the left lung apex (series 4 image 33). There are degenerative changes of the spine. ACDF noted. IMPRESSION: Stable mild dilatation of the ascending thoracic aorta. Moderate pulmonary emphysema with stable pulmonary nodules measuring up to 7 mm as previously described. DOSE REDUCTION: This exam was performed according to our departmental dose-optimization program which includes automated exposure control, adjustment of the mA and/or kV according to patient size and/or use of iterative reconstruction technique. Electronically signed by: Italy Engel MD 01/09/2024 03:02 PM EDT RP Workstation: [id number]   CT Chest W Contrast Result Date: 01/07/2024 EXAM: CT CHEST WITH CONTRAST 01/03/2024 09:51:58 AM TECHNIQUE: CT of the chest was performed with the administration  of 75mL of intravenous iohexol  (OMNIPAQUE ) 300 MG/ML solution. Multiplanar reformatted images are provided for review. Automated exposure control, iterative reconstruction, and/or weight based adjustment of the mA/kV was utilized to  reduce the radiation dose to as low as reasonably achievable. COMPARISON: 01/23/2023 CLINICAL HISTORY: Head/neck cancer, monitor. Follow up for cancer of supraglottis, not on chemo. Denies any current symptoms. FINDINGS: MEDIASTINUM: Heart and pericardium are unremarkable. The central airways are clear. Mild 3-vessel coronary atherosclerosis. LYMPH NODES: No mediastinal, hilar or axillary lymphadenopathy. LUNGS AND PLEURA: Moderate centrilobular and paraseptal emphysematous changes, upper lung predominant. Numerous scattered pulmonary nodules, approximately 20 to 25 in number, new. Dominant nodules include: - 5 mm left upper lobe nodule (image 41) - 7 mm left upper lobe nodule (image 53) - 4 mm right upper lobe nodule (image 55) - 6 mm right upper lobe nodule (image 70) No pleural effusion or pneumothorax. SOFT TISSUES/BONES: Cervical spine fixation hardware. No acute abnormality of the soft tissues. UPPER ABDOMEN: Limited images of the upper abdomen demonstrates no acute abnormality. IMPRESSION: 1. Numerous new scattered pulmonary nodules, measuring up to 7 mm in the left upper lobe, compatible with metastases. Electronically signed by: Zadie Herter MD 01/07/2024 11:31 PM EDT RP Workstation: [id number]    ASSESSMENT AND PLAN: Assessment and Plan Assessment & Plan Lung nodules, possible metastasis from laryngeal primary Multiple small lung nodules on CT, largest 7 mm. Distribution suggests metastasis from head and neck cancer. Nodules too small for biopsy, will however discuss with IR about possible biopsy. - Present case at ENT tumor board. - Consult radiology for biopsy feasibility. - Plan chemotherapy if metastatic cancer confirmed. - Schedule follow-up in three weeks for biopsy results and treatment discussion. - Continue follow-up with ENT as needed.  I have spent a total of 30 minutes minutes of face-to-face and non-face-to-face time, preparing to see the patient, performing a medically  appropriate examination, counseling and educating the patient, documenting clinical information in the electronic health record, and care coordination.   Murleen Arms MD

## 2024-01-16 ENCOUNTER — Other Ambulatory Visit: Payer: Self-pay | Admitting: Hematology and Oncology

## 2024-01-16 DIAGNOSIS — C321 Malignant neoplasm of supraglottis: Secondary | ICD-10-CM

## 2024-01-16 NOTE — Progress Notes (Signed)
 Art Largo, MD  Joelle Musca PROCEDURE / BIOPSY REVIEW Date: 01/15/24  Requested Biopsy site: Lung Reason for request: nodule Imaging review: Best seen on CTA Chest  Decision: Declined / Defer  Additional comments: Small, sub-1 cm pulmonary nodules, largest 6-7 mm at LUL. Consider bronchoscopic Bx Spoke w ordering provider, Dr Arno Bibles Re request.  Please contact me with questions, concerns, or if issue pertaining to this request arise.  Art Largo, MD [facility]       Previous Messages    ----- Message ----- From: Cory [name] Sent: 01/15/2024   3:14 PM EDT To: Anaija Wissink; Ir Procedure Requests Subject: CT lung mass biopsy                            Procedure : CT Lung mass biopsy  Reason : evaluate for metastatic recurrence in pt with hx of squamous cell carcinoma Dx: Malignant neoplasm of supraglottis (HCC) [C32.1 (ICD-10-CM)]; Multiple lung nodules [R91.8 (ICD-10-CM)]    History : CT angio chest aorta w/wo , Ct Chest w/, CT soft tissue neck w/  Provider : Murleen Arms, MD  Provider contact : [phone]

## 2024-01-16 NOTE — Progress Notes (Signed)
 Pulmonology referral placed Message sent to Dr Baldwin Levee.  Cory [name]

## 2024-01-17 ENCOUNTER — Telehealth: Payer: Self-pay | Admitting: Emergency Medicine

## 2024-01-17 NOTE — Telephone Encounter (Signed)
 Need to eval, see if nodules have been stable on serial films, whether any would be targets for bx

## 2024-01-17 NOTE — Telephone Encounter (Signed)
-----   Message from [place] sent at 01/16/2024  4:11 PM EDT ----- Dr Baldwin Levee  This is a very nice guy with laryngeal cancer treated with definitive intent and un defined low grade lymphoproliferative disorder now presents with multiple lung nodules, largest on is 7 mm. IR said you should be the go to person. If you can kindly review his case and see him so we can treat him appropriately, we will greatly appreciate it.  Thanks,

## 2024-01-20 NOTE — Telephone Encounter (Signed)
 Being seen on 6/19

## 2024-01-23 ENCOUNTER — Encounter: Payer: Self-pay | Admitting: Emergency Medicine

## 2024-01-23 ENCOUNTER — Ambulatory Visit: Payer: Medicare (Managed Care) | Admitting: Emergency Medicine

## 2024-01-23 ENCOUNTER — Telehealth: Payer: Self-pay | Admitting: Emergency Medicine

## 2024-01-23 VITALS — BP 120/72 | HR 62 | Ht 76.0 in | Wt 210.8 lb

## 2024-01-23 DIAGNOSIS — J438 Other emphysema: Secondary | ICD-10-CM | POA: Diagnosis not present

## 2024-01-23 DIAGNOSIS — C321 Malignant neoplasm of supraglottis: Secondary | ICD-10-CM

## 2024-01-23 DIAGNOSIS — J309 Allergic rhinitis, unspecified: Secondary | ICD-10-CM | POA: Insufficient documentation

## 2024-01-23 DIAGNOSIS — Z87891 Personal history of nicotine dependence: Secondary | ICD-10-CM

## 2024-01-23 DIAGNOSIS — R918 Other nonspecific abnormal finding of lung field: Secondary | ICD-10-CM | POA: Insufficient documentation

## 2024-01-23 DIAGNOSIS — J301 Allergic rhinitis due to pollen: Secondary | ICD-10-CM

## 2024-01-23 NOTE — Telephone Encounter (Signed)
   Please schedule the following:  Provider performing procedure: Lakedra [place] Diagnosis: Bilateral pulmonary nodules Which side if for nodule / mass?  Bilateral Procedure: Robotic assisted navigational bronchoscopy Has patient been spoken to by Provider and given informed consent?  Yes Anesthesia: General Do you need Fluro?  Yes Duration of procedure: 60 minutes Date: 02/17/2024 Alternate Date: 02/18/2024 Time: Any Location: [facility] Does patient have OSA?  No DM?  No or Latex allergy?  No Medication Restriction/ Anticoagulate/Antiplatelet: Stop Xarelto  2 days prior Pre-op Labs Ordered:determined by Anesthesia Imaging request: CT chest is available in PACS (If, SuperDimension CT Chest, please have STAT courier sent to ENDO)

## 2024-01-23 NOTE — Assessment & Plan Note (Signed)
 Currently asymptomatic but we may decide to consider pulmonary function testing, BD therapy at some point going forward

## 2024-01-23 NOTE — Progress Notes (Signed)
 Subjective:    Patient ID: Cory [name], male    DOB: 1946/05/10, 78 y.o.   MRN: [id number]  HPI Cory [name] is a 78 year old male with a history of malignant neoplasm of the supraglottis who presents for evaluation of multiple pulmonary nodules.  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 He underwent radiation therapy and cisplatin  treatment.  Surveillance imaging has shown multiple small pulmonary nodules. A CT scan of the chest performed on Jan 03, 2024, revealed moderate central lobe or paraseptal emphysema, predominantly in the upper lobes, and numerous scattered pulmonary nodules numbering over 20. The dominant nodules include 5 and 7 mm nodules in the left upper lobe and 4 and 6 mm nodules in the right upper lobe.  He has intermittent cough, nonproductive, no dyspnea.  He has significant allergies and congestion, rhinitis.   RADIOLOGY Chest CT: Moderate central lobe or paraseptal emphysema, upper lobe predominant. Numerous scattered pulmonary nodules numbering over 20. Dominant nodules include 5 mm and 7 mm nodules in the left upper lobe, 4 mm and 6 mm nodules in the right upper lobe (01/03/2024).  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   Review of Systems As per HPI  Past Medical History:  Diagnosis Date   AAA (abdominal aortic aneurysm) (HCC)    Aortic stenosis    a. Moderate by echo 05/2020.   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/2022   PAF (paroxysmal atrial fibrillation) (HCC)    Peripheral arterial  disease (HCC)    Tobacco abuse      Family History  Problem Relation Age of Onset   Ovarian cancer Mother    Alcoholism Father    Breast cancer Sister    Ovarian cancer Sister      Social History   Socioeconomic History   Marital status: Married    Spouse name: Not on file   Number of children: 5   Years of education: Not on file   Highest education level: Master's degree (e.g., MA, MS, MEng, MEd, MSW, MBA)  Occupational History   Not on file  Tobacco Use   Smoking status: Former    Current packs/day: 0.50    Average packs/day: 0.5 packs/day for 50.0 years (25.0 ttl pk-yrs)    Types: Cigarettes    Passive exposure: Never   Smokeless tobacco: Never   Tobacco comments:    Quit smoking few months ago 01/23/2024  Vaping Use   Vaping status: Never Used  Substance and Sexual Activity   Alcohol use: Yes    Alcohol/week: 0.0 standard drinks of alcohol    Comment: 4 to 5 times a week; beer, wine and liquor    Drug use: No   Sexual activity: Not Currently  Other Topics Concern   Not on file  Social History Narrative   Scores reading tests.  Lives with wife.     Social Drivers of Corporate investment banker Strain: Low Risk  (07/23/2023)   Overall Financial Resource Strain (CARDIA)    Difficulty of Paying Living  Expenses: Not hard at all  Food Insecurity: No Food Insecurity (07/23/2023)   Hunger Vital Sign    Worried About Running Out of Food in the Last Year: Never true    Ran Out of Food in the Last Year: Never true  Transportation Needs: No Transportation Needs (07/23/2023)   PRAPARE - Administrator, Civil Service (Medical): No    Lack of Transportation (Non-Medical): No  Physical Activity: Sufficiently Active (07/23/2023)   Exercise Vital Sign    Days of Exercise per Week: 5 days    Minutes of Exercise per Session: 30 min  Recent Concern: Physical Activity - Insufficiently Active (05/06/2023)   Exercise Vital Sign    Days of Exercise per Week: 5 days     Minutes of Exercise per Session: 20 min  Stress: No Stress Concern Present (07/23/2023)   [employer] of Occupational Health - Occupational Stress Questionnaire    Feeling of Stress : Not at all  Social Connections: Moderately Integrated (07/23/2023)   Social Connection and Isolation Panel    Frequency of Communication with Friends and Family: More than three times a week    Frequency of Social Gatherings with Friends and Family: Three times a week    Attends Religious Services: Never    Active Member of Clubs or Organizations: Yes    Attends Banker Meetings: 1 to 4 times per year    Marital Status: Married  Recent Concern: Social Connections - Moderately Isolated (05/06/2023)   Social Connection and Isolation Panel    Frequency of Communication with Friends and Family: More than three times a week    Frequency of Social Gatherings with Friends and Family: More than three times a week    Attends Religious Services: Never    Database administrator or Organizations: No    Attends Banker Meetings: Never    Marital Status: Married  Catering manager Violence: Not At Risk (02/25/2023)   Humiliation, Afraid, Rape, and Kick questionnaire    Fear of Current or Ex-Partner: No    Emotionally Abused: No    Physically Abused: No    Sexually Abused: No     Allergies  Allergen Reactions   Zithromax  [Azithromycin ] Diarrhea   Chlorpheniramine Cough and Itching     Outpatient Medications Prior to Visit  Medication Sig Dispense Refill   acetaminophen  (TYLENOL ) 500 MG tablet Take 500 mg by mouth every 6 (six) hours as needed (for pain.).     cetirizine  (ZYRTEC ) 10 MG tablet Take 1 tablet (10 mg total) by mouth daily. 30 tablet 11   cholecalciferol  (VITAMIN D ) 25 MCG (1000 UNIT) tablet Take 1,000 Units by mouth 2 (two) times daily.     diltiazem  (TIADYLT  ER) 120 MG 24 hr capsule Take 1 capsule (120 mg total) by mouth daily. 90 capsule 3   metoprolol  tartrate  (LOPRESSOR ) 25 MG tablet Take 1 tablet by mouth twice daily as needed 180 tablet 1   nicotine  (NICODERM CQ  - DOSED IN MG/24 HOURS) 21 mg/24hr patch Place 1 patch (21 mg total) onto the skin daily. Apply 21 mg patch daily x 6 wk, then 14mg  patch daily x 2 wk, then 7 mg patch daily x 2 wk 14 patch 2   rivaroxaban  (XARELTO ) 20 MG TABS tablet Take 1 tablet (20 mg total) by mouth daily with supper. 90 tablet 1   sodium chloride  (OCEAN) 0.65 % SOLN nasal spray Place 1 spray into both nostrils as needed for  congestion.     fluticasone  (FLONASE ) 50 MCG/ACT nasal spray Place 2 sprays into both nostrils daily. (Patient not taking: Reported on 01/23/2024) 16 g 6   lidocaine -prilocaine  (EMLA ) cream Apply to affected area once (Patient not taking: Reported on 01/23/2024) 30 g 3   omeprazole  (PRILOSEC) 40 MG capsule Take 1 capsule (40 mg total) by mouth daily. (Patient not taking: Reported on 01/23/2024) 30 capsule 6   rosuvastatin  (CRESTOR ) 10 MG tablet Take 1 tablet by mouth once daily (Patient not taking: Reported on 01/23/2024) 30 tablet 0   No facility-administered medications prior to visit.         Objective:   Physical Exam Vitals:   01/23/24 1453  BP: 120/72  Pulse: 62  SpO2: 93%  Weight: 210 lb 12.8 oz (95.6 kg)  Height: 6' 4 (1.93 m)    Gen: Pleasant, well-nourished, in no distress,  normal affect  ENT: No lesions,  mouth clear,  oropharynx clear, no postnasal drip  Neck: No JVD, no stridor  Lungs: No use of accessory muscles, no crackles or wheezing on normal respiration, no wheeze on forced expiration  Cardiovascular: RRR, heart sounds normal, no murmur or gallops, no peripheral edema  Musculoskeletal: No deformities, no cyanosis or clubbing  Neuro: alert, awake, non focal  Skin: Warm, no lesions or rash      Assessment & Plan:   Pulmonary nodules Some are punctate but some are his largest 7 mm.  Certainly concerning for metastatic disease.  We talked about the risk and  benefits, pros and cons of bronchoscopy.  I knowledge that these are small targets and that the sensitivity might be lower, but if we are able to establish a diagnosis that it would significantly impact his care and that he could start chemotherapy sooner.  Even if the biopsies are negative he is still going to have surveillance imaging to see if they grow and become better targets, which is likely no different than the conservative plan.  He understands and agrees.  I will work on getting this set up for 02/17/2024 which is when he is next available.  Paraseptal emphysema (HCC) Currently asymptomatic but we may decide to consider pulmonary function testing, BD therapy at some point going forward  Allergic rhinitis Continue Zyrtec .  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 Treated with radiation therapy and cisplatin . Surveillance imaging shows multiple small pulmonary nodules concerning for possible metastatic disease.   Racheal Buddle, MD, PhD 01/23/2024, 3:26 PM [facility] [phone] or if no answer before 7:00PM call [phone] For any issues after 7:00PM please call eLink [phone]

## 2024-01-23 NOTE — H&P (View-Only) (Signed)
 Subjective:    Patient ID: Cory [name], male    DOB: 1946/05/10, 78 y.o.   MRN: [id number]  HPI Cory [name] is a 78 year old male with a history of malignant neoplasm of the supraglottis who presents for evaluation of multiple pulmonary nodules.  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 He underwent radiation therapy and cisplatin  treatment.  Surveillance imaging has shown multiple small pulmonary nodules. A CT scan of the chest performed on Jan 03, 2024, revealed moderate central lobe or paraseptal emphysema, predominantly in the upper lobes, and numerous scattered pulmonary nodules numbering over 20. The dominant nodules include 5 and 7 mm nodules in the left upper lobe and 4 and 6 mm nodules in the right upper lobe.  He has intermittent cough, nonproductive, no dyspnea.  He has significant allergies and congestion, rhinitis.   RADIOLOGY Chest CT: Moderate central lobe or paraseptal emphysema, upper lobe predominant. Numerous scattered pulmonary nodules numbering over 20. Dominant nodules include 5 mm and 7 mm nodules in the left upper lobe, 4 mm and 6 mm nodules in the right upper lobe (01/03/2024).  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   Review of Systems As per HPI  Past Medical History:  Diagnosis Date   AAA (abdominal aortic aneurysm) (HCC)    Aortic stenosis    a. Moderate by echo 05/2020.   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/2022   PAF (paroxysmal atrial fibrillation) (HCC)    Peripheral arterial  disease (HCC)    Tobacco abuse      Family History  Problem Relation Age of Onset   Ovarian cancer Mother    Alcoholism Father    Breast cancer Sister    Ovarian cancer Sister      Social History   Socioeconomic History   Marital status: Married    Spouse name: Not on file   Number of children: 5   Years of education: Not on file   Highest education level: Master's degree (e.g., MA, MS, MEng, MEd, MSW, MBA)  Occupational History   Not on file  Tobacco Use   Smoking status: Former    Current packs/day: 0.50    Average packs/day: 0.5 packs/day for 50.0 years (25.0 ttl pk-yrs)    Types: Cigarettes    Passive exposure: Never   Smokeless tobacco: Never   Tobacco comments:    Quit smoking few months ago 01/23/2024  Vaping Use   Vaping status: Never Used  Substance and Sexual Activity   Alcohol use: Yes    Alcohol/week: 0.0 standard drinks of alcohol    Comment: 4 to 5 times a week; beer, wine and liquor    Drug use: No   Sexual activity: Not Currently  Other Topics Concern   Not on file  Social History Narrative   Scores reading tests.  Lives with wife.     Social Drivers of Corporate investment banker Strain: Low Risk  (07/23/2023)   Overall Financial Resource Strain (CARDIA)    Difficulty of Paying Living  Expenses: Not hard at all  Food Insecurity: No Food Insecurity (07/23/2023)   Hunger Vital Sign    Worried About Running Out of Food in the Last Year: Never true    Ran Out of Food in the Last Year: Never true  Transportation Needs: No Transportation Needs (07/23/2023)   PRAPARE - Administrator, Civil Service (Medical): No    Lack of Transportation (Non-Medical): No  Physical Activity: Sufficiently Active (07/23/2023)   Exercise Vital Sign    Days of Exercise per Week: 5 days    Minutes of Exercise per Session: 30 min  Recent Concern: Physical Activity - Insufficiently Active (05/06/2023)   Exercise Vital Sign    Days of Exercise per Week: 5 days     Minutes of Exercise per Session: 20 min  Stress: No Stress Concern Present (07/23/2023)   [employer] of Occupational Health - Occupational Stress Questionnaire    Feeling of Stress : Not at all  Social Connections: Moderately Integrated (07/23/2023)   Social Connection and Isolation Panel    Frequency of Communication with Friends and Family: More than three times a week    Frequency of Social Gatherings with Friends and Family: Three times a week    Attends Religious Services: Never    Active Member of Clubs or Organizations: Yes    Attends Banker Meetings: 1 to 4 times per year    Marital Status: Married  Recent Concern: Social Connections - Moderately Isolated (05/06/2023)   Social Connection and Isolation Panel    Frequency of Communication with Friends and Family: More than three times a week    Frequency of Social Gatherings with Friends and Family: More than three times a week    Attends Religious Services: Never    Database administrator or Organizations: No    Attends Banker Meetings: Never    Marital Status: Married  Catering manager Violence: Not At Risk (02/25/2023)   Humiliation, Afraid, Rape, and Kick questionnaire    Fear of Current or Ex-Partner: No    Emotionally Abused: No    Physically Abused: No    Sexually Abused: No     Allergies  Allergen Reactions   Zithromax  [Azithromycin ] Diarrhea   Chlorpheniramine Cough and Itching     Outpatient Medications Prior to Visit  Medication Sig Dispense Refill   acetaminophen  (TYLENOL ) 500 MG tablet Take 500 mg by mouth every 6 (six) hours as needed (for pain.).     cetirizine  (ZYRTEC ) 10 MG tablet Take 1 tablet (10 mg total) by mouth daily. 30 tablet 11   cholecalciferol  (VITAMIN D ) 25 MCG (1000 UNIT) tablet Take 1,000 Units by mouth 2 (two) times daily.     diltiazem  (TIADYLT  ER) 120 MG 24 hr capsule Take 1 capsule (120 mg total) by mouth daily. 90 capsule 3   metoprolol  tartrate  (LOPRESSOR ) 25 MG tablet Take 1 tablet by mouth twice daily as needed 180 tablet 1   nicotine  (NICODERM CQ  - DOSED IN MG/24 HOURS) 21 mg/24hr patch Place 1 patch (21 mg total) onto the skin daily. Apply 21 mg patch daily x 6 wk, then 14mg  patch daily x 2 wk, then 7 mg patch daily x 2 wk 14 patch 2   rivaroxaban  (XARELTO ) 20 MG TABS tablet Take 1 tablet (20 mg total) by mouth daily with supper. 90 tablet 1   sodium chloride  (OCEAN) 0.65 % SOLN nasal spray Place 1 spray into both nostrils as needed for  congestion.     fluticasone  (FLONASE ) 50 MCG/ACT nasal spray Place 2 sprays into both nostrils daily. (Patient not taking: Reported on 01/23/2024) 16 g 6   lidocaine -prilocaine  (EMLA ) cream Apply to affected area once (Patient not taking: Reported on 01/23/2024) 30 g 3   omeprazole  (PRILOSEC) 40 MG capsule Take 1 capsule (40 mg total) by mouth daily. (Patient not taking: Reported on 01/23/2024) 30 capsule 6   rosuvastatin  (CRESTOR ) 10 MG tablet Take 1 tablet by mouth once daily (Patient not taking: Reported on 01/23/2024) 30 tablet 0   No facility-administered medications prior to visit.         Objective:   Physical Exam Vitals:   01/23/24 1453  BP: 120/72  Pulse: 62  SpO2: 93%  Weight: 210 lb 12.8 oz (95.6 kg)  Height: 6' 4 (1.93 m)    Gen: Pleasant, well-nourished, in no distress,  normal affect  ENT: No lesions,  mouth clear,  oropharynx clear, no postnasal drip  Neck: No JVD, no stridor  Lungs: No use of accessory muscles, no crackles or wheezing on normal respiration, no wheeze on forced expiration  Cardiovascular: RRR, heart sounds normal, no murmur or gallops, no peripheral edema  Musculoskeletal: No deformities, no cyanosis or clubbing  Neuro: alert, awake, non focal  Skin: Warm, no lesions or rash      Assessment & Plan:   Pulmonary nodules Some are punctate but some are his largest 7 mm.  Certainly concerning for metastatic disease.  We talked about the risk and  benefits, pros and cons of bronchoscopy.  I knowledge that these are small targets and that the sensitivity might be lower, but if we are able to establish a diagnosis that it would significantly impact his care and that he could start chemotherapy sooner.  Even if the biopsies are negative he is still going to have surveillance imaging to see if they grow and become better targets, which is likely no different than the conservative plan.  He understands and agrees.  I will work on getting this set up for 02/17/2024 which is when he is next available.  Paraseptal emphysema (HCC) Currently asymptomatic but we may decide to consider pulmonary function testing, BD therapy at some point going forward  Allergic rhinitis Continue Zyrtec .  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 Treated with radiation therapy and cisplatin . Surveillance imaging shows multiple small pulmonary nodules concerning for possible metastatic disease.   Racheal Buddle, MD, PhD 01/23/2024, 3:26 PM [facility] [phone] or if no answer before 7:00PM call [phone] For any issues after 7:00PM please call eLink [phone]

## 2024-01-23 NOTE — Patient Instructions (Signed)
 We discussed the pros and cons of bronchoscopy to biopsy small pulmonary nodules noted on your CT scan of the chest. We will work on setting up navigational bronchoscopy which is done under general anesthesia as an outpatient at [facility] endoscopy.  We will try to get this set up for 02/17/2024.  You will need a designated driver and someone to watch you at home that day after the procedure.  You will need to stop your Xarelto  for 2 days prior to the procedure. We will ensure that you had a surveillance CT scan imaging of your chest if your biopsies are negative to follow for interval change, interval stability. Follow with Dr Arno Bibles and Dr Lurena Sally as planned.

## 2024-01-23 NOTE — Assessment & Plan Note (Signed)
 Some are punctate but some are his largest 7 mm.  Certainly concerning for metastatic disease.  We talked about the risk and benefits, pros and cons of bronchoscopy.  I knowledge that these are small targets and that the sensitivity might be lower, but if we are able to establish a diagnosis that it would significantly impact his care and that he could start chemotherapy sooner.  Even if the biopsies are negative he is still going to have surveillance imaging to see if they grow and become better targets, which is likely no different than the conservative plan.  He understands and agrees.  I will work on getting this set up for 02/17/2024 which is when he is next available.

## 2024-01-23 NOTE — Assessment & Plan Note (Signed)
 Continue Zyrtec .  Consider restarting Flonase  if continues to be bothersome

## 2024-01-23 NOTE — Assessment & Plan Note (Signed)
 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 Treated with radiation therapy and cisplatin . Surveillance imaging shows multiple small pulmonary nodules concerning for possible metastatic disease.

## 2024-01-23 NOTE — Telephone Encounter (Signed)
 Letter given by [facility] Case # [id number]

## 2024-01-24 ENCOUNTER — Encounter: Payer: Self-pay | Admitting: Cardiology

## 2024-01-24 MED ORDER — DILTIAZEM HCL ER BEADS 120 MG PO CP24: 120.0000 mg | ORAL_CAPSULE | Freq: Every day | ORAL | 1 refills | Status: DC

## 2024-01-27 ENCOUNTER — Encounter: Payer: Self-pay | Admitting: Emergency Medicine

## 2024-01-28 NOTE — Telephone Encounter (Signed)
 Any way to adjust this,Please advise

## 2024-01-30 DIAGNOSIS — L439 Lichen planus, unspecified: Secondary | ICD-10-CM | POA: Diagnosis not present

## 2024-01-30 DIAGNOSIS — C44519 Basal cell carcinoma of skin of other part of trunk: Secondary | ICD-10-CM | POA: Diagnosis not present

## 2024-02-05 ENCOUNTER — Telehealth: Payer: Self-pay

## 2024-02-05 NOTE — Telephone Encounter (Signed)
 Pt verbally confirmed appt for 7/3

## 2024-02-06 ENCOUNTER — Inpatient Hospital Stay: Payer: Medicare (Managed Care) | Attending: Hematology and Oncology | Admitting: Hematology and Oncology

## 2024-02-06 VITALS — BP 121/63 | HR 54 | Temp 98.7°F | Resp 17 | Ht 76.0 in | Wt 207.8 lb

## 2024-02-06 DIAGNOSIS — Z923 Personal history of irradiation: Secondary | ICD-10-CM | POA: Diagnosis not present

## 2024-02-06 DIAGNOSIS — C321 Malignant neoplasm of supraglottis: Secondary | ICD-10-CM | POA: Diagnosis not present

## 2024-02-06 DIAGNOSIS — D479 Neoplasm of uncertain behavior of lymphoid, hematopoietic and related tissue, unspecified: Secondary | ICD-10-CM | POA: Diagnosis not present

## 2024-02-06 DIAGNOSIS — Z87891 Personal history of nicotine dependence: Secondary | ICD-10-CM | POA: Insufficient documentation

## 2024-02-06 DIAGNOSIS — R918 Other nonspecific abnormal finding of lung field: Secondary | ICD-10-CM | POA: Insufficient documentation

## 2024-02-06 NOTE — Progress Notes (Signed)
 [facility] CONSULT NOTE  Patient Care Team: Micheal Wolm ORN, MD as PCP - General [name], MD as PCP - Cardiology (Cardiology) [name], [facility] (Inactive) as Pharmacist (Pharmacist) Malmfelt, Delon CROME, RN as Oncology Nurse Navigator Izell Domino, MD as Consulting Physician (Radiation Oncology) Loretha Ash, MD as Consulting Physician (Hematology and Oncology) Carlie Clark, MD as Consulting Physician (Otolaryngology)  CHIEF COMPLAINTS/PURPOSE OF CONSULTATION:  SCC supraglottis  ONCOLOGIC HISTORY: Initially presented to ENT with chronic left-sided sore throat for almost a year.  Initial evaluation did not show any possible causes for sore throat.  He was recommended PPI twice daily but went back to ENT with persistent sore throat he once again had a repeat fiberoptic exam which was stable.   CT neck done on November 08, 2021 did show subcentimeter focus of mucosal hyperenhancement along the posterior aspect of the uvula  laryngoscopy with direct visualization recommended to exclude a mucosal lesion at the site.  Additionally few tiny cystic-appearing foci along the left glossotonsillar sulcus.  Direct visualization recommended.  Nonspecific mildly enlarged right level 2 lymph node.  Multiple small calcific foci in the left palatine tonsil.Repeat fiberoptic exam was unremarkable hence Dr. 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  Tongue biopsy showed benign squamous mucosa with low to intermediate grade dysplasia. 05/07/2022: PET/CT showed asymmetric hypermetabolism in the left oropharynx and involving the left epiglottis.  Contralateral hypermetabolic level 2 cervical lymph nodes compatible with metastatic disease.  No hypermetabolic adenopathy in the left neck or in the chest.  1.2  cm short axis aortocaval lymph node in the upper abdomen is hypermetabolic metastatic disease is a concern.  This lymph node has increased from 0.7 cm on the study from 2022.  No other metastatic disease. Received radiation from 05/30/2022-07/18/2022 and weekly cisplatin  from 06/01/2022-07/18/2022.   INTERIM HISTORY:  Cory [name] returns for a follow up after completion of chemoradiation on 07/18/2022.   Discussed the use of AI scribe software for clinical note transcription with the patient, who gave verbal consent to proceed. History of Present Illness  Cory [name] is a 78 year old male who presents for follow-up regarding pulmonary nodules.  He is currently asymptomatic with no new symptoms reported since the last visit. No issues with eating, breathing, bowel movements, urination, or falls. He has experienced a slight weight loss, described as losing a couple of pounds.  He has a history of smoking but has quit, which is noted as a significant lifestyle change.  Rest of the pertinent 10 point ROS reviewed and neg.  Wt Readings from Last 3 Encounters:  02/06/24 207 lb 12.8 oz (94.3 kg)  01/23/24 210 lb 12.8 oz (95.6 kg)  01/15/24 211 lb 3.2 oz (95.8 kg)    MEDICAL HISTORY:  Past Medical History:  Diagnosis Date   AAA (abdominal aortic aneurysm) (HCC)    Aortic stenosis    a. Moderate by echo 05/2020.   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/2022   PAF (paroxysmal atrial fibrillation) (HCC)    Peripheral arterial disease (HCC)    Tobacco abuse     SURGICAL HISTORY: Past Surgical History:  Procedure Laterality Date  ABDOMINAL AORTIC ENDOVASCULAR STENT GRAFT N/A 08/29/2020   Procedure: ABDOMINAL AORTIC ENDOVASCULAR STENT GRAFT;  Surgeon: Harvey Carlin BRAVO, MD;  Location: [facility] OR;  Service: Vascular;  Laterality: N/A;   ABDOMINAL AORTOGRAM  W/LOWER EXTREMITY Bilateral 06/24/2020   Procedure: ABDOMINAL AORTOGRAM W/LOWER EXTREMITY;  Surgeon: Harvey Carlin BRAVO, MD;  Location: [facility] INVASIVE CV LAB;  Service: Cardiovascular;  Laterality: Bilateral;   BACK SURGERY     2005   BIOPSY  05/21/2022   Procedure: BIOPSY;  Surgeon: Wilhelmenia Aloha Raddle., MD;  Location: [facility];  Service: Gastroenterology;;   CERVICAL SPINE SURGERY     DIRECT LARYNGOSCOPY Bilateral 04/18/2022   Procedure: DIRECT LARYNGOSCOPY WITH BIOPSIES;  Surgeon: Carlie Clark, MD;  Location: [facility] OR;  Service: ENT;  Laterality: Bilateral;   EMBOLECTOMY Right 08/29/2020   Procedure: POPLITEAL AND TIBIAL EMBOLECTOMY;  Surgeon: Harvey Carlin BRAVO, MD;  Location: [facility] OR;  Service: Vascular;  Laterality: Right;   EMBOLIZATION (CATH LAB) Right 06/24/2020   Procedure: EMBOLIZATION;  Surgeon: Harvey Carlin BRAVO, MD;  Location: [facility]VAS[facility] CV LAB;  Service: Cardiovascular;  Laterality: Right;   ENDARTERECTOMY FEMORAL Right 08/29/2020   Procedure: ENDARTERECTOMY FEMORAL;  Surgeon: Harvey Carlin BRAVO, MD;  Location: [facility] OR;  Service: Vascular;  Laterality: Right;   ESOPHAGOGASTRODUODENOSCOPY N/A 06/27/2022   Procedure: ESOPHAGOGASTRODUODENOSCOPY (EGD);  Surgeon: Wilhelmenia Aloha Raddle., MD;  Location: [facility];  Service: Gastroenterology;  Laterality: N/A;   ESOPHAGOGASTRODUODENOSCOPY (EGD) WITH PROPOFOL  N/A 05/21/2022   Procedure: ESOPHAGOGASTRODUODENOSCOPY (EGD) WITH PROPOFOL ;  Surgeon: Wilhelmenia Aloha Raddle., MD;  Location: [facility];  Service: Gastroenterology;  Laterality: N/A;   EUS N/A 05/21/2022   Procedure: UPPER ENDOSCOPIC ULTRASOUND (EUS) RADIAL;  Surgeon: Wilhelmenia Aloha Raddle., MD;  Location: [facility];  Service: Gastroenterology;  Laterality: N/A;   EUS N/A 06/27/2022   Procedure: UPPER ENDOSCOPIC ULTRASOUND (EUS) RADIAL;  Surgeon: Wilhelmenia Aloha Raddle., MD;  Location: [facility];  Service: Gastroenterology;  Laterality: N/A;   EYE SURGERY     FEMORAL ARTERY  EXPLORATION Right 06/25/2020   Procedure: RIGHT GROIN EXPLORATION Evacuation of Hematoma  WITH REPAIR OF RIGHT Common FEMORAL ARTERY.;  Surgeon: Eliza Lonni RAMAN, MD;  Location: [facility] OR;  Service: Vascular;  Laterality: Right;   FEMORAL-POPLITEAL BYPASS GRAFT Left 11/22/2020   Procedure: LEFT ABOVE KNEE-BELOW KNEE BYPASS TIBIAL PERITONEAL TRUNK REVERSE IPSILATERAL GREATER SAPHENOUS VEIN;  Surgeon: Harvey Carlin BRAVO, MD;  Location: [facility] OR;  Service: Vascular;  Laterality: Left;   FINE NEEDLE ASPIRATION N/A 05/21/2022   Procedure: FINE NEEDLE ASPIRATION (FNA) LINEAR;  Surgeon: Wilhelmenia Aloha Raddle., MD;  Location: [facility];  Service: Gastroenterology;  Laterality: N/A;   FINE NEEDLE ASPIRATION  06/27/2022   Procedure: FINE NEEDLE ASPIRATION;  Surgeon: Wilhelmenia Aloha Raddle., MD;  Location: [facility];  Service: Gastroenterology;;   HOT HEMOSTASIS N/A 06/27/2022   Procedure: HOT HEMOSTASIS (ARGON PLASMA COAGULATION/BICAP);  Surgeon: Wilhelmenia Aloha Raddle., MD;  Location: [facility];  Service: Gastroenterology;  Laterality: N/A;   IR GASTROSTOMY TUBE MOD SED  06/12/2022   IR GASTROSTOMY TUBE REMOVAL  09/04/2022   IR IMAGING GUIDED PORT INSERTION  06/12/2022   IR REMOVAL TUN ACCESS W/ PORT W/O FL MOD SED  11/21/2022   LEG SURGERY     Trauma   LOWER EXTREMITY ANGIOGRAPHY Left 11/17/2020   Procedure: Lower Extremity Angiography;  Surgeon: Gretta Lonni PARAS, MD;  Location: [facility] INVASIVE CV LAB;  Service: Cardiovascular;  Laterality: Left;   PATCH ANGIOPLASTY Right 08/29/2020   Procedure: PATCH ANGIOPLASTY RIGHT BELOW KNEE POPLITEAL ARTERY AND  RIGHT COMMON FEMORAL ARTERY;  Surgeon: Harvey Carlin BRAVO, MD;  Location: [facility] OR;  Service: Vascular;  Laterality: Right;   RETINAL DETACHMENT SURGERY     RIGID ESOPHAGOSCOPY  04/18/2022   Procedure: RIGID ESOPHAGOSCOPY;  Surgeon: Carlie Clark, MD;  Location: [facility] OR;  Service: ENT;;   TONSILLECTOMY  04/18/2022   Procedure: LEFT TONSILLECTOMY;  Surgeon:  Carlie Clark, MD;  Location: [facility] OR;  Service: ENT;;   ULTRASOUND GUIDANCE FOR VASCULAR ACCESS Bilateral 08/29/2020   Procedure: ULTRASOUND GUIDANCE FOR VASCULAR ACCESS;  Surgeon: Harvey Carlin BRAVO, MD;  Location: [facility] OR;  Service: Vascular;  Laterality: Bilateral;    SOCIAL HISTORY: Social History   Socioeconomic History   Marital status: Married    Spouse name: Not on file   Number of children: 5   Years of education: Not on file   Highest education level: Master's degree (e.g., MA, MS, MEng, MEd, MSW, MBA)  Occupational History   Not on file  Tobacco Use   Smoking status: Former    Current packs/day: 0.50    Average packs/day: 0.5 packs/day for 50.0 years (25.0 ttl pk-yrs)    Types: Cigarettes    Passive exposure: Never   Smokeless tobacco: Never   Tobacco comments:    Quit smoking few months ago 01/23/2024  Vaping Use   Vaping status: Never Used  Substance and Sexual Activity   Alcohol use: Yes    Alcohol/week: 0.0 standard drinks of alcohol    Comment: 4 to 5 times a week; beer, wine and liquor    Drug use: No   Sexual activity: Not Currently  Other Topics Concern   Not on file  Social History Narrative   Scores reading tests.  Lives with wife.     Social Drivers of Corporate investment banker Strain: Low Risk  (07/23/2023)   Overall Financial Resource Strain (CARDIA)    Difficulty of Paying Living Expenses: Not hard at all  Food Insecurity: No Food Insecurity (07/23/2023)   Hunger Vital Sign    Worried About Running Out of Food in the Last Year: Never true    Ran Out of Food in the Last Year: Never true  Transportation Needs: No Transportation Needs (07/23/2023)   PRAPARE - Administrator, Civil Service (Medical): No    Lack of Transportation (Non-Medical): No  Physical Activity: Sufficiently Active (07/23/2023)   Exercise Vital Sign    Days of Exercise per Week: 5 days    Minutes of Exercise per Session: 30 min  Recent Concern: Physical Activity -  Insufficiently Active (05/06/2023)   Exercise Vital Sign    Days of Exercise per Week: 5 days    Minutes of Exercise per Session: 20 min  Stress: No Stress Concern Present (07/23/2023)   [employer] of Occupational Health - Occupational Stress Questionnaire    Feeling of Stress : Not at all  Social Connections: Moderately Integrated (07/23/2023)   Social Connection and Isolation Panel    Frequency of Communication with Friends and Family: More than three times a week    Frequency of Social Gatherings with Friends and Family: Three times a week    Attends Religious Services: Never    Active Member of Clubs or Organizations: Yes    Attends Banker Meetings: 1 to 4 times per year    Marital Status: Married  Recent Concern: Social Connections - Moderately Isolated (05/06/2023)   Social Connection and Isolation Panel    Frequency of Communication with  Friends and Family: More than three times a week    Frequency of Social Gatherings with Friends and Family: More than three times a week    Attends Religious Services: Never    Database administrator or Organizations: No    Attends Banker Meetings: Never    Marital Status: Married  Catering manager Violence: Not At Risk (02/25/2023)   Humiliation, Afraid, Rape, and Kick questionnaire    Fear of Current or Ex-Partner: No    Emotionally Abused: No    Physically Abused: No    Sexually Abused: No    FAMILY HISTORY: Family History  Problem Relation Age of Onset   Ovarian cancer Mother    Alcoholism Father    Breast cancer Sister    Ovarian cancer Sister     ALLERGIES:  is allergic to zithromax  [azithromycin ] and chlorpheniramine.  MEDICATIONS:  Current Outpatient Medications  Medication Sig Dispense Refill   acetaminophen  (TYLENOL ) 500 MG tablet Take 500 mg by mouth every 6 (six) hours as needed (for pain.).     cetirizine  (ZYRTEC ) 10 MG tablet Take 1 tablet (10 mg total) by mouth daily. 30 tablet 11    cholecalciferol  (VITAMIN D ) 25 MCG (1000 UNIT) tablet Take 1,000 Units by mouth 2 (two) times daily.     diltiazem  (TIADYLT  ER) 120 MG 24 hr capsule Take 1 capsule (120 mg total) by mouth daily. 90 capsule 1   fluticasone  (FLONASE ) 50 MCG/ACT nasal spray Place 2 sprays into both nostrils daily. (Patient not taking: Reported on 01/23/2024) 16 g 6   lidocaine -prilocaine  (EMLA ) cream Apply to affected area once (Patient not taking: Reported on 01/23/2024) 30 g 3   metoprolol  tartrate (LOPRESSOR ) 25 MG tablet Take 1 tablet by mouth twice daily as needed 180 tablet 1   nicotine  (NICODERM CQ  - DOSED IN MG/24 HOURS) 21 mg/24hr patch Place 1 patch (21 mg total) onto the skin daily. Apply 21 mg patch daily x 6 wk, then 14mg  patch daily x 2 wk, then 7 mg patch daily x 2 wk 14 patch 2   omeprazole  (PRILOSEC) 40 MG capsule Take 1 capsule (40 mg total) by mouth daily. (Patient not taking: Reported on 01/23/2024) 30 capsule 6   rivaroxaban  (XARELTO ) 20 MG TABS tablet Take 1 tablet (20 mg total) by mouth daily with supper. 90 tablet 1   rosuvastatin  (CRESTOR ) 10 MG tablet Take 1 tablet by mouth once daily (Patient not taking: Reported on 01/23/2024) 30 tablet 0   sodium chloride  (OCEAN) 0.65 % SOLN nasal spray Place 1 spray into both nostrils as needed for congestion.     No current facility-administered medications for this visit.     PHYSICAL EXAMINATION: ECOG PERFORMANCE STATUS: 1 - Symptomatic but completely ambulatory  Vitals:   02/06/24 1324  BP: 121/63  Pulse: (!) 54  Resp: 17  Temp: 98.7 F (37.1 C)  SpO2: 95%    Filed Weights   02/06/24 1324  Weight: 207 lb 12.8 oz (94.3 kg)     Physical Exam Constitutional:      Appearance: Normal appearance.  Cardiovascular:     Rate and Rhythm: Normal rate and regular rhythm.  Pulmonary:     Effort: Pulmonary effort is normal.     Breath sounds: Normal breath sounds.  Abdominal:     Comments:    Musculoskeletal:        General: No swelling or  tenderness. Normal range of motion.     Cervical back: Normal range  of motion and neck supple. No rigidity.  Lymphadenopathy:     Cervical: No cervical adenopathy.  Skin:    General: Skin is warm and dry.  Neurological:     General: No focal deficit present.     Mental Status: He is alert.      LABORATORY DATA:  I have reviewed the data as listed Lab Results  Component Value Date   WBC 4.7 01/15/2024   HGB 15.9 01/15/2024   HCT 46.1 01/15/2024   MCV 97.3 01/15/2024   PLT 159 01/15/2024     Chemistry      Component Value Date/Time   NA 136 09/17/2023 1332   NA 141 05/16/2018 1425   K 4.3 09/17/2023 1332   CL 102 09/17/2023 1332   CO2 31 09/17/2023 1332   BUN 15 09/17/2023 1332   BUN 13 05/16/2018 1425   CREATININE 1.10 01/03/2024 0944   CREATININE 0.91 09/17/2023 1332      Component Value Date/Time   CALCIUM  9.1 09/17/2023 1332   ALKPHOS 36 (L) 09/17/2023 1332   AST 14 (L) 09/17/2023 1332   ALT 14 09/17/2023 1332   BILITOT 0.4 09/17/2023 1332       RADIOGRAPHIC STUDIES: I have personally reviewed the radiological images as listed and agreed with the findings in the report. CT ANGIO CHEST AORTA W &/OR WO CONTRAST Result Date: 01/09/2024 EXAMINATION: CT ANGIO CHEST AORTA W & OR WO CONTRAST CLINICAL INDICATION: Male, 78 years old. enalrged ascending aorta TECHNIQUE: Axial CTA of the chest with 100 cc Omnipaque  300 intravenous contrast. Multiplanar and 3D reformations provided. Unless otherwise specified, incidental thyroid , adrenal, renal lesions do not require dedicated imaging follow up. Additionally, any mentioned pulmonary nodules do not require dedicated imaging follow-up based on the Fleischner guidelines unless otherwise specified. Coronary calcifications are not identified unless otherwise specified. COMPARISON: 01/03/2024, 01/23/2023 FINDINGS: The ascending thoracic aorta remains dilated measuring 4.1 cm, which is unchanged when measured in similar fashion on the  prior examinations. The aortic arch and descending thoracic aorta are normal in caliber. Scattered atherosclerotic changes are present. The origins of the great vessels appear patent. The visualized thyroid  is normal. The main pulmonary artery is normal in caliber. The heart is normal in size. There is no free fluid or pathologic lymphadenopathy by size criteria. The visualized upper abdominal structures appear within normal limits. The trachea and mainstem bronchi are patent. Subsegmental atelectatic changes are noted within the lung bases. There is moderate centrilobular/paraseptal pulmonary emphysema. Stable pulmonary nodules are noted bilaterally with the largest measuring 7 mm in the left lung apex (series 4 image 33). There are degenerative changes of the spine. ACDF noted. IMPRESSION: Stable mild dilatation of the ascending thoracic aorta. Moderate pulmonary emphysema with stable pulmonary nodules measuring up to 7 mm as previously described. DOSE REDUCTION: This exam was performed according to our departmental dose-optimization program which includes automated exposure control, adjustment of the mA and/or kV according to patient size and/or use of iterative reconstruction technique. Electronically signed by: [place] Engel MD 01/09/2024 03:02 PM EDT RP Workstation: [id number]    ASSESSMENT AND PLAN: Assessment and Plan Assessment & Plan Lung nodules, possible metastasis from laryngeal primary Multiple small lung nodules on CT, largest 7 mm. Distribution suggests metastasis from head and neck cancer.  Biopsy planned with Dr Shelah this month If inconclusive, then we will do repeat imaging in 3 months and attempt to rebiopsy He is agreeable to the plan RTC with me as scheduled to discuss the  biopsy results.   I have spent a total of 30 minutes minutes of face-to-face and non-face-to-face time, preparing to see the patient, performing a medically appropriate examination, counseling and educating the  patient, documenting clinical information in the electronic health record, and care coordination.   Amber Stalls MD

## 2024-02-14 ENCOUNTER — Other Ambulatory Visit: Payer: Self-pay

## 2024-02-14 ENCOUNTER — Encounter (HOSPITAL_COMMUNITY): Payer: Self-pay | Admitting: Emergency Medicine

## 2024-02-14 NOTE — Anesthesia Preprocedure Evaluation (Signed)
 Anesthesia Evaluation  Patient identified by MRN, date of birth, ID band Patient awake    Reviewed: Allergy & Precautions, NPO status , Patient's Chart, lab work & pertinent test results  History of Anesthesia Complications Negative for: history of anesthetic complications  Airway Mallampati: III  TM Distance: >3 FB Neck ROM: Full   Comment: Previous grade I view with Miller 2, easy mask with OPA Dental  (+) Dental Advisory Given, Upper Dentures, Lower Dentures   Pulmonary neg shortness of breath, neg sleep apnea, COPD (does not use inhalers), neg recent URI, Patient abstained from smoking., former smoker Pulmonary nodules   Pulmonary exam normal breath sounds clear to auscultation       Cardiovascular hypertension (metoprolol ), Pt. on home beta blockers (-) angina + Peripheral Vascular Disease  (-) Past MI, (-) Cardiac Stents and (-) CABG + dysrhythmias Atrial Fibrillation + Valvular Problems/Murmurs (mild-to-moderate AI, moderate AS)  Rhythm:Regular Rate:Normal  AAA s/p stent  TTE 05/21/2023: IMPRESSIONS    1. Left ventricular ejection fraction, by estimation, is 60 to 65%. The  left ventricle has normal function. The left ventricle has no regional  wall motion abnormalities. There is mild concentric left ventricular  hypertrophy. Left ventricular diastolic  parameters are consistent with Grade I diastolic dysfunction (impaired  relaxation). Elevated left ventricular end-diastolic pressure.   2. Right ventricular systolic function is normal. The right ventricular  size is normal. There is mildly elevated pulmonary artery systolic  pressure.   3. Left atrial size was moderately dilated.   4. The mitral valve is degenerative. No evidence of mitral valve  regurgitation. No evidence of mitral stenosis. Moderate mitral annular  calcification.   5. The aortic valve is calcified. There is severe calcifcation of the  aortic valve.  There is severe thickening of the aortic valve. Aortic valve  regurgitation is mild to moderate. Moderate aortic valve stenosis. Aortic  regurgitation PHT measures 566  msec. Aortic valve area, by VTI measures 1.73 cm. Aortic valve mean  gradient measures 28.0 mmHg. Aortic valve Vmax measures 3.44 m/s.   6. Aortic dilatation noted. There is moderate dilatation of the ascending  aorta, measuring 45 mm.   7. The inferior vena cava is normal in size with <50% respiratory  variability, suggesting right atrial pressure of 8 mmHg.     Neuro/Psych neg Seizures  Neuromuscular disease (s/p C5-7 ACDF)    GI/Hepatic Neg liver ROS,GERD  Medicated,,  Endo/Other  negative endocrine ROS    Renal/GU negative Renal ROS     Musculoskeletal  (+) Arthritis ,    Abdominal   Peds  Hematology negative hematology ROS (+) Lab Results      Component                Value               Date                      WBC                      4.7                 01/15/2024                HGB                      15.9  01/15/2024                HCT                      46.1                01/15/2024                MCV                      97.3                01/15/2024                PLT                      159                 01/15/2024              Anesthesia Other Findings H/o cancer of supraglottis  Last Xarelto : 02/14/2024  Reproductive/Obstetrics                              Anesthesia Physical Anesthesia Plan  ASA: 3  Anesthesia Plan: General   Post-op Pain Management: Tylenol  PO (pre-op)*   Induction: Intravenous  PONV Risk Score and Plan: 2 and Ondansetron , Dexamethasone , Propofol  infusion, TIVA and Treatment may vary due to age or medical condition  Airway Management Planned: Oral ETT  Additional Equipment:   Intra-op Plan:   Post-operative Plan: Extubation in OR  Informed Consent: I have reviewed the patients History and Physical, chart,  labs and discussed the procedure including the risks, benefits and alternatives for the proposed anesthesia with the patient or authorized representative who has indicated his/her understanding and acceptance.     Dental advisory given  Plan Discussed with: CRNA and Anesthesiologist  Anesthesia Plan Comments: (PAT note written 02/14/2024 by Nykolas Bacallao, PA-C.  Risks of general anesthesia discussed including, but not limited to, sore throat, hoarse voice, chipped/damaged teeth, injury to vocal cords, nausea and vomiting, allergic reactions, lung infection, heart attack, stroke, and death. All questions answered. )         Anesthesia Quick Evaluation

## 2024-02-14 NOTE — Progress Notes (Signed)
 SDW CALL  Patient was given pre-op instructions over the phone. The opportunity was given for the patient to ask questions. No further questions asked. Patient verbalized understanding of instructions given.   PCP - Dr. Wolm Scarlet Cardiologist -  Dr. Lynwood Schilling  PPM/ICD - denies   Chest CT- 01/07/24 EKG - 07/11/23 Stress Test - 02/15/15 ECHO - 05/21/23 Cardiac Cath - denies  Sleep Study - denies  No DM  Last dose of GLP1 agonist-  n/a GLP1 instructions:  n/a  Blood Thinner Instructions:  Last dose of Eliquis - 7/11 - patient is aware   ERAS Protcol - NPO PRE-SURGERY Ensure or G2-  n/a  COVID TEST-  n/a   Anesthesia review: yes  Patient denies shortness of breath, fever, cough and chest pain over the phone call   All instructions explained to the patient, with a verbal understanding of the material. Patient agrees to go over the instructions while at home for a better understanding.

## 2024-02-14 NOTE — Progress Notes (Signed)
 Anesthesia Chart Review: SAME DAY WORK-UP  Case: [id number] Date/Time: 02/17/24 0730   Procedure: VIDEO BRONCHOSCOPY WITH ENDOBRONCHIAL NAVIGATION (Bilateral)   Anesthesia type: General   Diagnosis: Pulmonary nodules [R91.8]   Pre-op diagnosis: Bilateral pulmonary nodules   [facility] / [facility]   Surgeons: Shelah Lamar RAMAN, MD       DISCUSSION: Patient is an 78 year old male scheduled for the above procedure. Treated for supraglottic cancer around later 2023. 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   He follows with cardiology for history of paroxysmal A. fib and moderate aortic stenosis. He is maintained on Xarelto .  He had a negative stress test 02/15/2015.  Most recent echo on 05/21/2023 showed LVEF 60 to 65%, no regional wall motion abnormalities, mild concentric LVH, grade 1 diastolic dysfunction, normal RV systolic function, mildly elevated PASP, moderately dilated LA, moderate mitral annular calcification with no evidence of MR or MS, severely calcified AV with mild to moderate AR and moderate AS ( Aortic valve area, by VTI measures 1.73 cm. Aortic valve  mean gradient measures 28.0 mmHg. Aortic valve Vmax measures 3.44 m/s), moderate dilatation of the ascending  aorta, measuring 45 mm.  Last visit no change in therapy.  12 months follow-up plan with repeat echo around 06/2024 and CTA chest in June to evaluation ascending thoracic aorta (done 01/09/24, ascending TAA stable at 4.1 cm).   Follows with vascular surgery for history of PAD s/p abdominal aortic stent graft with right common femoral endarterectomy with bovine patch angioplasty 08/29/2020, left above-knee to below-knee popliteal artery bypass due to an occluded popliteal artery aneurysm 11/22/2020.  Last APP evaluation 09/30/23 with 1 year follow-up recommended.    Last Xarelto  02/14/24.   Anesthesia team to evaluate on the day of surgery.  VS:  Wt Readings from Last 3 Encounters:  02/06/24 94.3 kg  01/23/24 95.6 kg  01/15/24 95.8 kg   BP Readings from Last 3 Encounters:  02/06/24 121/63  01/23/24 120/72  01/15/24 (!) 140/74   Pulse Readings from Last 3 Encounters:  02/06/24 (!) 54  01/23/24 62  01/15/24 61     PROVIDERS: Micheal Wolm ORN, MD is PCP  [name], MD is cardiologist Shelah Lamar, MD is pulmonologist Loretha Ash, MD is HEM-[facility] Carlie Clark, MD is ENT Izell Domino, MD is RAD-ONC Harvey Dunnings, MD was his vascular surgeon, no longer with [facility]. Last follow-up with APP on 09/30/23 with 1 year follow-up planned.    LABS: Most recent lab results in [facility] include: Lab Results  Component Value Date   WBC 4.7 01/15/2024   HGB 15.9 01/15/2024   HCT 46.1 01/15/2024   PLT 159 01/15/2024   GLUCOSE 104 (H) 09/17/2023   ALT 14 09/17/2023   AST 14 (L) 09/17/2023   NA 136 09/17/2023   K 4.3 09/17/2023   CL 102 09/17/2023   CREATININE  1.10 01/03/2024   BUN 15 09/17/2023   CO2 31 09/17/2023     IMAGES: CT Soft Tissue Neck 01/03/2024: IMPRESSION: Focus of hyperenhancement along the posterior aspect of the uvula seen in 2023 is not appreciated on the  current study.   The previously noted cystic foci along the left glossotonsillar sulcus seen on CT in 2023 are not present on the current study.   There is edematous appearance of the oropharynx mucosa and mild thickening of the epiglottis favored to reflect post treatment changes.   Interval resolution of right level 2 cervical node enlargement. No cervical lymphadenopathy on the current study.   Aortic Atherosclerosis (ICD10-I70.0) and Emphysema (ICD10-J43.9).    CTA Chest/Aorta 01/09/2024: IMPRESSION: - The ascending thoracic aorta remains dilated measuring 4.1 cm, which is unchanged when measured in similar fashion on the prior examinations. - Moderate pulmonary emphysema with stable pulmonary nodules measuring up to 7 mm as previously described.  CT Chest 01/03/2024: IMPRESSION: 1. Numerous new scattered pulmonary nodules, measuring up to 7 mm in the left upper lobe, compatible with metastases.   EKG: 07/11/2023: Sinus bradycardia at 55 bpm  Septal infarct , age undetermined When compared with ECG of 04-Apr-2004 12:10, No significant change was found Confirmed by [name] ([id number]) on 07/11/2023 9:46:05 AM   CV: US  EVAR Duplex 09/30/2023: Summary:  Stenosis:  - Patent EVAR with no evidence of endoleak with the largest aortic  measurement of 3.6 cm (Prior: 2.6 cm)  - Patent EVAR limbs.  - Slightly larger diameter of Right CIA aneurysm sac compared to prior.  (2.8 cm to 3.2 cm)  - Stable left CIA aneurysm compared to prior. (2.0 cm to 2.0 cm)     TTE 05/21/2023: IMPRESSIONS   1. Left ventricular ejection fraction, by estimation, is 60 to 65%. The  left ventricle has normal function. The left ventricle has no regional  wall motion abnormalities. There is mild concentric left ventricular  hypertrophy. Left ventricular diastolic  parameters are consistent with Grade I diastolic dysfunction (impaired  relaxation). Elevated left ventricular end-diastolic pressure.    2. Right ventricular systolic function is normal. The right ventricular  size is normal. There is mildly elevated pulmonary artery systolic  pressure.   3. Left atrial size was moderately dilated.   4. The mitral valve is degenerative. No evidence of mitral valve  regurgitation. No evidence of mitral stenosis. Moderate mitral annular  calcification.   5. The aortic valve is calcified. There is severe calcifcation of the  aortic valve. There is severe thickening of the aortic valve. Aortic valve  regurgitation is mild to moderate. Moderate aortic valve stenosis. Aortic  regurgitation PHT measures 566  msec. Aortic valve area, by VTI measures 1.73 cm. Aortic valve mean  gradient measures 28.0 mmHg. Aortic valve Vmax measures 3.44 m/s.   6. Aortic dilatation noted. There is moderate dilatation of the ascending  aorta, measuring 45 mm.   7. The inferior vena cava is normal in size with <50% respiratory  variability, suggesting right atrial pressure of 8 mmHg.     Exercise tolerance test 02/15/2015: There was no ST segment deviation noted during stress.   Past Medical History:  Diagnosis Date   AAA (abdominal aortic aneurysm) (HCC)    Aortic stenosis    a. Moderate by echo 05/2020.   Arthritis    right hip   Cancer (HCC)    skin cancer nose, right left, upper left basel cell   Dysrhythmia    a-fib  Heart murmur    Malignant neoplasm of supraglottis (HCC) 05/2022   PAF (paroxysmal atrial fibrillation) (HCC)    Peripheral arterial disease (HCC)    Tobacco abuse     Past Surgical History:  Procedure Laterality Date   ABDOMINAL AORTIC ENDOVASCULAR STENT GRAFT N/A 08/29/2020   Procedure: ABDOMINAL AORTIC ENDOVASCULAR STENT GRAFT;  Surgeon: Harvey Carlin BRAVO, MD;  Location: [facility] OR;  Service: Vascular;  Laterality: N/A;   ABDOMINAL AORTOGRAM W/LOWER EXTREMITY Bilateral 06/24/2020   Procedure: ABDOMINAL AORTOGRAM W/LOWER EXTREMITY;  Surgeon: Harvey Carlin BRAVO, MD;  [facility]  INV[facility] CV [facility];  Service: Cardiovascular;  Laterality: Bilateral;   BACK SURGERY     2005   BIOPSY  05/21/2022   Procedure: BIOPSY;  Surgeon: Wilhelmenia Aloha Raddle., MD;  Location: [facility];  Service: Gastroenterology;;   CERVICAL SPINE SURGERY     DIRECT LARYNGOSCOPY Bilateral 04/18/2022   Procedure: DIRECT LARYNGOSCOPY WITH BIOPSIES;  Surgeon: Carlie Clark, MD;  Location: [facility] OR;  Service: ENT;  Laterality: Bilateral;   EMBOLECTOMY Right 08/29/2020   Procedure: POPLITEAL AND TIBIAL EMBOLECTOMY;  Surgeon: Harvey Carlin BRAVO, MD;  Location: [facility] OR;  Service: Vascular;  Laterality: Right;   EMBOLIZATION (CATH LAB) Right 06/24/2020   Procedure: EMBOLIZATION;  Surgeon: Harvey Carlin BRAVO, MD;  [facility] IN[facility];  Service: Cardiovascular;  Laterality: Right;   ENDARTERECTOMY FEMORAL Right 08/29/2020   Procedure: ENDARTERECTOMY FEMORAL;  Surgeon: Harvey Carlin BRAVO, MD;  Location: [facility] OR;  Service: Vascular;  Laterality: Right;   ESOPHAGOGASTRODUODENOSCOPY N/A 06/27/2022   Procedure: ESOPHAGOGASTRODUODENOSCOPY (EGD);  Surgeon: Wilhelmenia Aloha Raddle., MD;  Location: [facility];  Service: Gastroenterology;  Laterality: N/A;   ESOPHAGOGASTRODUODENOSCOPY (EGD) WITH PROPOFOL  N/A 05/21/2022   Procedure: ESOPHAGOGASTRODUODENOSCOPY (EGD) WITH PROPOFOL ;  Surgeon: Wilhelmenia Aloha Raddle., MD;  Location: [facility];  Service: Gastroenterology;  Laterality: N/A;   EUS N/A 05/21/2022   Procedure: UPPER ENDOSCOPIC ULTRASOUND (EUS) RADIAL;  Surgeon: Wilhelmenia Aloha Raddle., MD;  Location: [facility];  Service: Gastroenterology;  Laterality: N/A;   EUS N/A 06/27/2022   Procedure: UPPER ENDOSCOPIC ULTRASOUND (EUS) RADIAL;  Surgeon: Wilhelmenia Aloha Raddle., MD;  Location: [facility];  Service: Gastroenterology;  Laterality: N/A;   EYE SURGERY     FEMORAL ARTERY EXPLORATION Right 06/25/2020   Procedure: RIGHT GROIN EXPLORATION Evacuation of Hematoma  WITH REPAIR OF RIGHT Common FEMORAL ARTERY.;   Surgeon: Eliza Lonni RAMAN, MD;  Location: [facility] OR;  Service: Vascular;  Laterality: Right;   FEMORAL-POPLITEAL BYPASS GRAFT Left 11/22/2020   Procedure: LEFT ABOVE KNEE-BELOW KNEE BYPASS TIBIAL PERITONEAL TRUNK REVERSE IPSILATERAL GREATER SAPHENOUS VEIN;  Surgeon: Harvey Carlin BRAVO, MD;  Location: [facility] OR;  Service: Vascular;  Laterality: Left;   FINE NEEDLE ASPIRATION N/A 05/21/2022   Procedure: FINE NEEDLE ASPIRATION (FNA) LINEAR;  Surgeon: Wilhelmenia Aloha Raddle., MD;  Location: [facility];  Service: Gastroenterology;  Laterality: N/A;   FINE NEEDLE ASPIRATION  06/27/2022   Procedure: FINE NEEDLE ASPIRATION;  Surgeon: Wilhelmenia Aloha Raddle., MD;  Location: [facility];  Service: Gastroenterology;;   HOT HEMOSTASIS N/A 06/27/2022   Procedure: HOT HEMOSTASIS (ARGON PLASMA COAGULATION/BICAP);  Surgeon: Wilhelmenia Aloha Raddle., MD;  Location: [facility];  Service: Gastroenterology;  Laterality: N/A;   IR GASTROSTOMY TUBE MOD SED  06/12/2022   IR GASTROSTOMY TUBE REMOVAL  09/04/2022   IR IMAGING GUIDED PORT INSERTION  06/12/2022   IR REMOVAL TUN ACCESS W/ PORT W/O FL MOD SED  11/21/2022   LEG SURGERY     Trauma   LOWER EXTREMITY ANGIOGRAPHY  Left 11/17/2020   Procedure: Lower Extremity Angiography;  Surgeon: Gretta Lonni PARAS, MD;  Location: [facility] INVASIVE CV LAB;  Service: Cardiovascular;  Laterality: Left;   PATCH ANGIOPLASTY Right 08/29/2020   Procedure: PATCH ANGIOPLASTY RIGHT BELOW KNEE POPLITEAL ARTERY AND RIGHT COMMON FEMORAL ARTERY;  Surgeon: Harvey Carlin BRAVO, MD;  Location: [facility] OR;  Service: Vascular;  Laterality: Right;   RETINAL DETACHMENT SURGERY     RIGID ESOPHAGOSCOPY  04/18/2022   Procedure: RIGID ESOPHAGOSCOPY;  Surgeon: Carlie Clark, MD;  Location: [facility] OR;  Service: ENT;;   TONSILLECTOMY  04/18/2022   Procedure: LEFT TONSILLECTOMY;  Surgeon: Carlie Clark, MD;  Location: [facility] OR;  Service: ENT;;   ULTRASOUND GUIDANCE FOR VASCULAR ACCESS Bilateral 08/29/2020   Procedure: ULTRASOUND  GUIDANCE FOR VASCULAR ACCESS;  Surgeon: Harvey Carlin BRAVO, MD;  Location: [facility] OR;  Service: Vascular;  Laterality: Bilateral;    MEDICATIONS: No current facility-administered medications for this encounter.    acetaminophen  (TYLENOL ) 500 MG tablet   cetirizine  (ZYRTEC ) 10 MG tablet   cholecalciferol  (VITAMIN D ) 25 MCG (1000 UNIT) tablet   diltiazem  (TIADYLT  ER) 120 MG 24 hr capsule   fluticasone  (FLONASE ) 50 MCG/ACT nasal spray   lidocaine -prilocaine  (EMLA ) cream   metoprolol  tartrate (LOPRESSOR ) 25 MG tablet   nicotine  (NICODERM CQ  - DOSED IN MG/24 HOURS) 21 mg/24hr patch   omeprazole  (PRILOSEC) 40 MG capsule   rivaroxaban  (XARELTO ) 20 MG TABS tablet   rosuvastatin  (CRESTOR ) 10 MG tablet   sodium chloride  (OCEAN) 0.65 % SOLN nasal spray    Isaiah Ruder, PA-C Surgical Short Stay/Anesthesiology [facility] Phone [phone] [facility] Phone [phone] 02/14/2024 11:35 AM

## 2024-02-17 ENCOUNTER — Ambulatory Visit (HOSPITAL_COMMUNITY): Payer: Medicare (Managed Care)

## 2024-02-17 ENCOUNTER — Ambulatory Visit (HOSPITAL_COMMUNITY)
Admission: RE | Admit: 2024-02-17 | Discharge: 2024-02-17 | Disposition: A | Discharge status: Home / Self Care | Payer: Medicare (Managed Care) | Attending: Emergency Medicine | Admitting: Emergency Medicine

## 2024-02-17 ENCOUNTER — Encounter (HOSPITAL_COMMUNITY): Payer: Self-pay | Admitting: Emergency Medicine

## 2024-02-17 ENCOUNTER — Ambulatory Visit (HOSPITAL_COMMUNITY): Payer: Medicare (Managed Care) | Admitting: Vascular Surgery

## 2024-02-17 ENCOUNTER — Encounter (HOSPITAL_COMMUNITY)
Admission: RE | Disposition: A | Discharge status: Home / Self Care | Payer: Self-pay | Source: Home / Self Care | Attending: Emergency Medicine

## 2024-02-17 ENCOUNTER — Ambulatory Visit (HOSPITAL_BASED_OUTPATIENT_CLINIC_OR_DEPARTMENT_OTHER): Payer: Medicare (Managed Care) | Admitting: Vascular Surgery

## 2024-02-17 ENCOUNTER — Other Ambulatory Visit: Payer: Self-pay

## 2024-02-17 DIAGNOSIS — Z8521 Personal history of malignant neoplasm of larynx: Secondary | ICD-10-CM | POA: Insufficient documentation

## 2024-02-17 DIAGNOSIS — Z87891 Personal history of nicotine dependence: Secondary | ICD-10-CM

## 2024-02-17 DIAGNOSIS — R918 Other nonspecific abnormal finding of lung field: Secondary | ICD-10-CM | POA: Diagnosis present

## 2024-02-17 DIAGNOSIS — I700 Atherosclerosis of aorta: Secondary | ICD-10-CM | POA: Diagnosis not present

## 2024-02-17 DIAGNOSIS — Z923 Personal history of irradiation: Secondary | ICD-10-CM | POA: Diagnosis not present

## 2024-02-17 DIAGNOSIS — R911 Solitary pulmonary nodule: Secondary | ICD-10-CM

## 2024-02-17 DIAGNOSIS — Z7901 Long term (current) use of anticoagulants: Secondary | ICD-10-CM | POA: Insufficient documentation

## 2024-02-17 DIAGNOSIS — J309 Allergic rhinitis, unspecified: Secondary | ICD-10-CM | POA: Diagnosis not present

## 2024-02-17 DIAGNOSIS — Z9221 Personal history of antineoplastic chemotherapy: Secondary | ICD-10-CM | POA: Insufficient documentation

## 2024-02-17 DIAGNOSIS — Z85828 Personal history of other malignant neoplasm of skin: Secondary | ICD-10-CM | POA: Insufficient documentation

## 2024-02-17 DIAGNOSIS — I739 Peripheral vascular disease, unspecified: Secondary | ICD-10-CM | POA: Insufficient documentation

## 2024-02-17 DIAGNOSIS — I080 Rheumatic disorders of both mitral and aortic valves: Secondary | ICD-10-CM | POA: Diagnosis not present

## 2024-02-17 DIAGNOSIS — I10 Essential (primary) hypertension: Secondary | ICD-10-CM | POA: Diagnosis not present

## 2024-02-17 DIAGNOSIS — I480 Paroxysmal atrial fibrillation: Secondary | ICD-10-CM | POA: Diagnosis not present

## 2024-02-17 HISTORY — DX: Cardiac murmur, unspecified: R01.1

## 2024-02-17 LAB — BASIC METABOLIC PANEL WITH GFR
Anion gap: 6 (ref 5–15)
BUN: 12 mg/dL (ref 8–23)
CO2: 28 mmol/L (ref 22–32)
Calcium: 9.3 mg/dL (ref 8.9–10.3)
Chloride: 103 mmol/L (ref 98–111)
Creatinine, Ser: 0.96 mg/dL (ref 0.61–1.24)
GFR, Estimated: >60 mL/min (ref >60)
Glucose, Bld: 108 mg/dL — ABNORMAL HIGH (ref 70–99)
Potassium: 4.2 mmol/L (ref 3.5–5.1)
Sodium: 137 mmol/L (ref 135–145)

## 2024-02-17 LAB — CBC
HCT: 48 % (ref 39.0–52.0)
Hemoglobin: 16.1 g/dL (ref 13.0–17.0)
MCH: 33.8 pg (ref 26.0–34.0)
MCHC: 33.5 g/dL (ref 30.0–36.0)
MCV: 100.6 fL — ABNORMAL HIGH (ref 80.0–100.0)
Platelets: 168 K/uL (ref 150–400)
RBC: 4.77 MIL/uL (ref 4.22–5.81)
RDW: 13 % (ref 11.5–15.5)
WBC: 5.9 K/uL (ref 4.0–10.5)
nRBC: 0 % (ref 0.0–0.2)

## 2024-02-17 SURGERY — VIDEO BRONCHOSCOPY WITH ENDOBRONCHIAL NAVIGATION
Anesthesia: General | Laterality: Bilateral

## 2024-02-17 MED ORDER — CHLORHEXIDINE GLUCONATE 0.12 % MT SOLN
15.0000 mL | Freq: Once | OROMUCOSAL | Status: AC
  Administered 2024-02-17: 15 mL via OROMUCOSAL
  Filled 2024-02-17: qty 15

## 2024-02-17 MED ORDER — PHENYLEPHRINE HCL-NACL 20-0.9 MG/250ML-% IV SOLN
INTRAVENOUS | Status: DC | PRN
  Administered 2024-02-17: 50 ug/min via INTRAVENOUS

## 2024-02-17 MED ORDER — ONDANSETRON HCL 4 MG/2ML IJ SOLN
INTRAMUSCULAR | Status: DC | PRN
  Administered 2024-02-17: 4 mg via INTRAVENOUS

## 2024-02-17 MED ORDER — AMISULPRIDE (ANTIEMETIC) 5 MG/2ML IV SOLN
10.0000 mg | Freq: Once | INTRAVENOUS | Status: DC | PRN
Start: 2024-02-17 — End: 2024-02-17

## 2024-02-17 MED ORDER — PROPOFOL 500 MG/50ML IV EMUL
INTRAVENOUS | Status: DC | PRN
  Administered 2024-02-17: 150 ug/kg/min via INTRAVENOUS

## 2024-02-17 MED ORDER — ROCURONIUM BROMIDE 10 MG/ML (PF) SYRINGE
PREFILLED_SYRINGE | INTRAVENOUS | Status: DC | PRN
  Administered 2024-02-17: 60 mg via INTRAVENOUS

## 2024-02-17 MED ORDER — LIDOCAINE 2% (20 MG/ML) 5 ML SYRINGE
INTRAMUSCULAR | Status: DC | PRN
  Administered 2024-02-17: 100 mg via INTRAVENOUS

## 2024-02-17 MED ORDER — PROPOFOL 10 MG/ML IV BOLUS
INTRAVENOUS | Status: DC | PRN
Start: 2024-02-17 — End: 2024-02-17
  Administered 2024-02-17: 110 mg via INTRAVENOUS
  Administered 2024-02-17: 40 mg via INTRAVENOUS

## 2024-02-17 MED ORDER — LACTATED RINGERS IV SOLN: INTRAVENOUS | Status: DC

## 2024-02-17 MED ORDER — DEXAMETHASONE SODIUM PHOSPHATE 10 MG/ML IJ SOLN
INTRAMUSCULAR | Status: DC | PRN
  Administered 2024-02-17: 10 mg via INTRAVENOUS

## 2024-02-17 MED ORDER — ACETAMINOPHEN 500 MG PO TABS
1000.0000 mg | ORAL_TABLET | Freq: Once | ORAL | Status: AC
  Administered 2024-02-17: 1000 mg via ORAL
  Filled 2024-02-17: qty 2

## 2024-02-17 MED ORDER — OXYCODONE HCL 5 MG/5ML PO SOLN: 5.0000 mg | Freq: Once | ORAL | Status: DC | PRN

## 2024-02-17 MED ORDER — EPHEDRINE SULFATE-NACL 50-0.9 MG/10ML-% IV SOSY
PREFILLED_SYRINGE | INTRAVENOUS | Status: DC | PRN
  Administered 2024-02-17: 5 mg via INTRAVENOUS

## 2024-02-17 MED ORDER — SUGAMMADEX SODIUM 200 MG/2ML IV SOLN
INTRAVENOUS | Status: DC | PRN
  Administered 2024-02-17: 200 mg via INTRAVENOUS

## 2024-02-17 MED ORDER — FENTANYL CITRATE (PF) 100 MCG/2ML IJ SOLN: 25.0000 ug | INTRAMUSCULAR | Status: DC | PRN

## 2024-02-17 MED ORDER — OXYCODONE HCL 5 MG PO TABS: 5.0000 mg | ORAL_TABLET | Freq: Once | ORAL | Status: DC | PRN

## 2024-02-17 SURGICAL SUPPLY — 36 items
ADAPTER BRONCHOSCOPE OLYMPUS (ADAPTER) ×2 IMPLANT
ADAPTER VALVE BIOPSY EBUS (MISCELLANEOUS) IMPLANT
BAG COUNTER SPONGE SURGICOUNT (BAG) ×2 IMPLANT
BRUSH CYTOL CELLEBRITY 1.5X140 (MISCELLANEOUS) ×2 IMPLANT
BRUSH SUPERTRAX BIOPSY (INSTRUMENTS) IMPLANT
BRUSH SUPERTRAX NDL-TIP CYTO (INSTRUMENTS) ×2 IMPLANT
CANISTER SUCTION 3000ML PPV (SUCTIONS) ×2 IMPLANT
CNTNR URN SCR LID CUP LEK RST (MISCELLANEOUS) ×2 IMPLANT
COVER BACK TABLE 60X90IN (DRAPES) ×2 IMPLANT
FILTER STRAW FLUID ASPIR (MISCELLANEOUS) IMPLANT
FORCEPS BIOP 1.5 SINGLE USE (MISCELLANEOUS) ×2 IMPLANT
FORCEPS BIOP SUPERTRX PREMAR (INSTRUMENTS) ×2 IMPLANT
GAUZE SPONGE 4X4 12PLY STRL (GAUZE/BANDAGES/DRESSINGS) ×2 IMPLANT
GLOVE BIO SURGEON STRL SZ7.5 (GLOVE) ×4 IMPLANT
GOWN STRL REUS W/ TWL LRG LVL3 (GOWN DISPOSABLE) ×4 IMPLANT
KIT CLEAN ENDO COMPLIANCE (KITS) ×2 IMPLANT
KIT LOCATABLE GUIDE (CANNULA) IMPLANT
KIT MARKER FIDUCIAL DELIVERY (KITS) IMPLANT
KIT TURNOVER KIT B (KITS) ×2 IMPLANT
MARKER SKIN DUAL TIP RULER LAB (MISCELLANEOUS) ×2 IMPLANT
NDL SUPERTRX PREMARK BIOPSY (NEEDLE) ×2 IMPLANT
NEEDLE SUPERTRX PREMARK BIOPSY (NEEDLE) ×2 IMPLANT
NS IRRIG 1000ML POUR BTL (IV SOLUTION) ×2 IMPLANT
OIL SILICONE PENTAX (PARTS (SERVICE/REPAIRS)) ×2 IMPLANT
PAD ARMBOARD POSITIONER FOAM (MISCELLANEOUS) ×4 IMPLANT
PATCHES PATIENT (LABEL) ×6 IMPLANT
SYR 20ML ECCENTRIC (SYRINGE) ×2 IMPLANT
SYR 20ML LL LF (SYRINGE) ×2 IMPLANT
SYR 50ML SLIP (SYRINGE) ×2 IMPLANT
TOWEL GREEN STERILE FF (TOWEL DISPOSABLE) ×2 IMPLANT
TRAP SPECIMEN MUCUS 40CC (MISCELLANEOUS) IMPLANT
TUBE CONNECTING 20X1/4 (TUBING) ×2 IMPLANT
UNDERPAD 30X36 HEAVY ABSORB (UNDERPADS AND DIAPERS) ×2 IMPLANT
VALVE BIOPSY SINGLE USE (MISCELLANEOUS) ×2 IMPLANT
VALVE SUCTION BRONCHIO DISP (MISCELLANEOUS) ×2 IMPLANT
WATER STERILE IRR 1000ML POUR (IV SOLUTION) ×2 IMPLANT

## 2024-02-17 NOTE — Op Note (Signed)
 Procedure Note  Patient: Cory [name]  [employer] Cios mobile C-arm was utilized to identify and biopsy a left upper lobe and a right upper lobe nodule.  Needle-in-lesion was confirmed using real-time Cios imaging, and images were uploaded to PACS.   Lamar Chris, MD, PhD 02/17/2024, 9:21 AM [facility] [phone] or if no answer before 7:00PM call [phone] For any issues after 7:00PM please call eLink [phone]

## 2024-02-17 NOTE — Op Note (Signed)
 Video Bronchoscopy with Robotic Assisted Bronchoscopic Navigation   Date of Operation: 02/17/2024   Pre-op Diagnosis: Bilateral pulmonary nodules  Post-op Diagnosis: Same  Surgeon: Lamar Chris  Assistants: None  Anesthesia: General endotracheal anesthesia  Operation: Flexible video fiberoptic bronchoscopy with robotic assistance and biopsies.  Estimated Blood Loss: Minimal  Complications: None  Indications and History: Cory [name] is a 78 y.o. male with history of malignant neoplasm of the supraglottis treated with chemoradiation.  Surveillance imaging shows multiple bilateral pulmonary nodules of varying sizes, largest 7 mm.  Recommendation made to achieve a tissue diagnosis via robotic assisted navigational bronchoscopy.  The risks, benefits, complications, treatment options and expected outcomes were discussed with the patient.  The possibilities of pneumothorax, pneumonia, reaction to medication, pulmonary aspiration, perforation of a viscus, bleeding, failure to diagnose a condition and creating a complication requiring transfusion or operation were discussed with the patient who freely signed the consent.    Description of Procedure: The patient was seen in the Preoperative Area, was examined and was deemed appropriate to proceed.  The patient was taken to [facility] Endoscopy room 3, identified as Cory [name] and the procedure verified as Flexible Video Fiberoptic Bronchoscopy.  A Time Out was held and the above information confirmed.   Prior to the date of the procedure a high-resolution CT scan of the chest was performed. Utilizing ION software program a virtual tracheobronchial tree was generated to allow the creation of distinct navigation pathways to the patient's parenchymal abnormalities. After being taken to the operating room general anesthesia was initiated and the patient  was orally intubated. The video fiberoptic bronchoscope was introduced via the endotracheal tube and a  general inspection was performed which showed normal right and left lung anatomy. Aspiration of the bilateral mainstems was completed to remove any remaining secretions. Robotic catheter inserted into patient's endotracheal tube.   Target #1 left upper lobe nodule: The distinct navigation pathways prepared prior to this procedure were then utilized to navigate to patient's lesion identified on CT scan. The robotic catheter was secured into place and the vision probe was withdrawn.  Lesion location was approximated using fluoroscopy.  Local registration and targeting was performed using [employer] Cios mobile C-arm three-dimensional imaging. Under fluoroscopic guidance transbronchial needle brushings, transbronchial needle biopsies, and transbronchial forceps biopsies were performed to be sent for cytology and pathology.  Needle-in-lesion was confirmed using Cios mobile C-arm.  A bronchioalveolar lavage was performed in the left upper lobe and sent for cytology.    Target #3 right upper lobe nodule: The distinct navigation pathways prepared prior to this procedure were then utilized to navigate to patient's lesion identified on CT scan. The robotic catheter was secured into place and the vision probe was withdrawn.  Lesion location was approximated using fluoroscopy.  Local registration and targeting was performed using [employer] Cios mobile C-arm three-dimensional imaging. Under fluoroscopic guidance transbronchial needle biopsies and transbronchial forceps biopsies were performed to be sent for cytology and pathology.  Needle-in-lesion was confirmed using Cios mobile C-arm.    At the end of the procedure a general airway inspection was performed and there was no evidence of active bleeding. The bronchoscope was removed.  The patient tolerated the procedure well. There was no significant blood loss and there were no obvious complications. A post-procedural chest x-ray is  pending.  Samples Target #1: 1. Transbronchial needle brushings from left upper lobe nodule 2. Transbronchial Wang needle biopsies from left upper lobe nodule 3. Transbronchial forceps biopsies  from left upper lobe nodule 4. Bronchoalveolar lavage from left upper lobe  Samples Target #2: 1. Transbronchial Wang needle biopsies from right upper lobe nodule 2. Transbronchial forceps biopsies from right upper lobe nodule   Plans:  The patient will be discharged from the PACU to home when recovered from anesthesia and after chest x-ray is reviewed. We will review the cytology, pathology and microbiology results with the patient when they become available. Outpatient followup will be with CANDIE Lites, NP.   Lamar Chris, MD, PhD 02/17/2024, 8:54 AM Cory [name] [phone] or if no answer before 7:00PM call [phone] For any issues after 7:00PM please call eLink [phone]

## 2024-02-17 NOTE — Discharge Instructions (Addendum)
 Flexible Bronchoscopy, Care After This sheet gives you information about how to care for yourself after your test. Your doctor may also give you more specific instructions. If you have problems or questions, contact your doctor. Follow these instructions at home: Eating and drinking When you are wide awake, your numbness is gone and your cough and gag reflexes have come back, you may: Start eating only soft foods. Slowly drink liquids. Six hours after the test, go back to your normal diet. Driving Do not drive for 24 hours if you were given a medicine to help you relax (sedative). Do not drive or use heavy machinery while taking prescription pain medicine. General instructions Take over-the-counter and prescription medicines only as told by your doctor. Return to your normal activities as told. Ask what activities are safe for you. Do not use any products that have nicotine  or tobacco in them. This includes cigarettes and e-cigarettes. If you need help quitting, ask your doctor. Keep all follow-up visits as told by your doctor. This is important. It is very important if you had a tissue sample (biopsy) taken. Get help right away if: You have shortness of breath that gets worse. You get light-headed. You feel like you are going to pass out (faint). You have chest pain. You cough up: More than a little blood. More blood than before. Summary Do not use cigarettes. Do not use e-cigarettes. Seek care in the Emergency Department right away if you have chest pain or shortness of breath. Call or MyChart Message our office for any questions or problems at [phone].  Okay to restart Xarelto  on 02/18/2024   This information is not intended to replace advice given to you by your health care provider. Make sure you discuss any questions you have with your health care provider.

## 2024-02-17 NOTE — Interval H&P Note (Signed)
 History and Physical Interval Note:  02/17/2024 7:20 AM  Cory [name] Lever  has presented today for surgery, with the diagnosis of Bilateral pulmonary nodules.  The various methods of treatment have been discussed with the patient and family. After consideration of risks, benefits and other options for treatment, the patient has consented to  Procedure(s): VIDEO BRONCHOSCOPY WITH ENDOBRONCHIAL NAVIGATION (Bilateral) as a surgical intervention.  The patient's history has been reviewed, patient examined, no change in status, stable for surgery.  I have reviewed the patient's chart and labs.  Questions were answered to the patient's satisfaction.     Lamar [name] Chris

## 2024-02-17 NOTE — Transfer of Care (Signed)
 Immediate Anesthesia Transfer of Care Note  Patient: Cory [name]  Procedure(s) Performed: VIDEO BRONCHOSCOPY WITH ENDOBRONCHIAL NAVIGATION (Bilateral) BRONCHOSCOPY, WITH BRUSH BIOPSY BRONCHOSCOPY, WITH NEEDLE ASPIRATION BIOPSY BRONCHOSCOPY, WITH BIOPSY IRRIGATION, BRONCHUS  Patient Location: PACU  Anesthesia Type:General  Level of Consciousness: awake  Airway & Oxygen Therapy: Patient Spontanous Breathing and Patient connected to face mask oxygen  Post-op Assessment: Report given to RN and Post -op Vital signs reviewed and stable  Post vital signs: Reviewed and stable  Last Vitals:  Vitals Value Taken Time  BP 114/65 02/17/24 09:04  Temp 36.6 C 02/17/24 09:03  Pulse 54 02/17/24 09:07  Resp 15 02/17/24 09:07  SpO2 95 % 02/17/24 09:07  Vitals shown include unfiled device data.  Last Pain:  Vitals:   02/17/24 0627  TempSrc:   PainSc: 0-No pain      Patients Stated Pain Goal: 0 (02/17/24 [id number])  Complications: No notable events documented.

## 2024-02-17 NOTE — Anesthesia Postprocedure Evaluation (Signed)
 Anesthesia Post Note  Patient: Cory [name]  Procedure(s) Performed: VIDEO BRONCHOSCOPY WITH ENDOBRONCHIAL NAVIGATION (Bilateral) BRONCHOSCOPY, WITH BRUSH BIOPSY BRONCHOSCOPY, WITH NEEDLE ASPIRATION BIOPSY BRONCHOSCOPY, WITH BIOPSY IRRIGATION, BRONCHUS     Patient location during evaluation: PACU Anesthesia Type: General Level of consciousness: awake Pain management: pain level controlled Vital Signs Assessment: post-procedure vital signs reviewed and stable Respiratory status: spontaneous breathing, nonlabored ventilation and respiratory function stable Cardiovascular status: blood pressure returned to baseline and stable Postop Assessment: no apparent nausea or vomiting Anesthetic complications: no   No notable events documented.  Last Vitals:  Vitals:   02/17/24 0915 02/17/24 0933  BP: (!) 111/50 108/62  Pulse: (!) 52 (!) 52  Resp: 18 15  Temp:  36.6 C  SpO2: 95% 93%    Last Pain:  Vitals:   02/17/24 0627  TempSrc:   PainSc: 0-No pain                 Delon Aisha Arch

## 2024-02-17 NOTE — Anesthesia Procedure Notes (Addendum)
 Procedure Name: Intubation Date/Time: 02/17/2024 7:34 AM  Performed by: Cindie Ronal POUR, CRNAPre-anesthesia Checklist: Patient identified, Emergency Drugs available, Suction available and Patient being monitored Patient Re-evaluated:Patient Re-evaluated prior to induction Oxygen Delivery Method: Circle System Utilized Preoxygenation: Pre-oxygenation with 100% oxygen Induction Type: IV induction Ventilation: Mask ventilation without difficulty Laryngoscope Size: Glidescope and 3 Grade View: Grade I Tube type: Oral Tube size: 8.5 mm Number of attempts: 1 Airway Equipment and Method: Stylet and Oral airway Placement Confirmation: ETT inserted through vocal cords under direct vision, positive ETCO2 and breath sounds checked- equal and bilateral Secured at: 24 cm Tube secured with: Tape Dental Injury: Teeth and Oropharynx as per pre-operative assessment

## 2024-02-18 LAB — CYTOLOGY - NON PAP

## 2024-02-19 LAB — CYTOLOGY - NON PAP

## 2024-02-25 ENCOUNTER — Ambulatory Visit: Payer: Medicare (Managed Care) | Admitting: Acute Care

## 2024-02-25 ENCOUNTER — Encounter: Payer: Self-pay | Admitting: Acute Care

## 2024-02-25 VITALS — BP 118/66 | HR 55 | Ht 76.0 in | Wt 208.0 lb

## 2024-02-25 DIAGNOSIS — R0609 Other forms of dyspnea: Secondary | ICD-10-CM | POA: Diagnosis not present

## 2024-02-25 DIAGNOSIS — Z9221 Personal history of antineoplastic chemotherapy: Secondary | ICD-10-CM | POA: Diagnosis not present

## 2024-02-25 DIAGNOSIS — Z923 Personal history of irradiation: Secondary | ICD-10-CM

## 2024-02-25 DIAGNOSIS — Z859 Personal history of malignant neoplasm, unspecified: Secondary | ICD-10-CM

## 2024-02-25 DIAGNOSIS — R918 Other nonspecific abnormal finding of lung field: Secondary | ICD-10-CM

## 2024-02-25 MED ORDER — BREZTRI AEROSPHERE 160-9-4.8 MCG/ACT IN AERO: 2.0000 | INHALATION_SPRAY | Freq: Two times a day (BID) | RESPIRATORY_TRACT | Status: DC

## 2024-02-25 NOTE — Progress Notes (Signed)
 History of Present Illness Cory [name] is a 78 y.o. male former smoker , quit less than 1 year ago, with history of malignant neoplasm.  He has been referred for pulmonary consult for evaluation of multiple pulmonary nodules.  He will be followed by Dr. Shelah  Synopsis 78 year old male with a history of malignant neoplasm of the supraglottis who presents for evaluation of multiple pulmonary nodules.   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 He underwent radiation therapy and cisplatin  treatment.   Surveillance imaging has shown multiple small pulmonary nodules. A CT scan of the chest performed on Jan 03, 2024, revealed moderate central lobe or paraseptal emphysema, predominantly in the upper lobes, and numerous scattered pulmonary nodules numbering over 20. The dominant nodules include 5 and 7 mm nodules in the left upper lobe and 4 and 6 mm nodules in the right upper lobe.These were concerning for metastatic disease .   He has intermittent cough, nonproductive, no dyspnea.  He has significant allergies and congestion, rhinitis.  Dr. Shelah saw the patient 01/23/2024. He  talked about the risk and benefits, pros and cons of bronchoscopy. Dr. Shelah  acknowledged that these are small targets and that the sensitivity might be lower, but if we are able to establish a diagnosis that it would significantly impact his care and that he could start chemotherapy sooner. Even if the biopsies are negative he is still going to have surveillance imaging to see if they grow and become better targets, which is likely no different than the conservative plan. Plan was for bronchoscopy with biopsies, which was done 02/17/2024. He is here today to review the results.   02/25/2024 Pt. Presents for follow up  bronch. He states he has done well after the procedure. He had some bleeding for 3-4 days after the procedure which has self resolved. He denies any fever, discolored secretions, shortness of breath or adverse reaction to anesthesia.  He has allergies. He is coughing , he has clear to white secretions. He attributes his cough to his allergies. He is taking non sedating anti-histamines which he states does help.    He was recently diagnosed with emphysema, identified on a recent scan. He is a former smoker, having quit less than a year ago after smoking one pack per day for approximately sixty years. He has not used inhalers previously. He experiences shortness of breath primarily with exertion.He does not recall ever having had pulmonary function testing done.   We have reviewed the results of is biopsies. They were negative for malignant cells in the left upper lobe  and right upper lobe targets. He denies any recent weight loss or hemoptysis.   Plan will be for follow up CT Chest in October  2025 by Dr. Loretha to follow the pulmonary nodules of concern.  If they have changed in the interval , we will consider PET scan or repeat bronchoscopy for tissue diagnosis. Call for unintentional weight loss or blood in your sputum, and we will see you sooner.   I will also evaluate his PFT's and we will do a therapeutic Trial with Breztri  to see if he has any improvement in his dyspnea with exertion, and to diagnose COPD if PFT's confirm.  Test Results: Cytology 02/17/2024  A.  LEFT LUNG, UPPER LOBE: TARGET  1, FINE NEEDLE ASPIRATION  BIOPSY:  - No malignant cells identified  - Fibroelastotic stroma and pulmonary macrophages   B.  LEFT LUNG, UPPER LOBE, TARGET 1, BRUSHING:  - No malignant cells identified  - Few pulmonary macrophages  - Predominantly blood   C. LUNG, LUL, LAVAGE:  FINAL MICROSCOPIC DIAGNOSIS:  - No malignant cells identified  - Benign bronchial cells and pulmonary macrophages   D.  RIGHT LUNG, UPPER LOBE, FINE NEEDLE ASPIRATION  BIOPSY:  - No malignant cells identified  - Scattered pulmonary macrophages and scant fibrous stroma  - Predominantly blood   12/2023  CT Chest LUNGS AND PLEURA: Moderate centrilobular and paraseptal emphysematous changes, upper lung predominant. Numerous scattered pulmonary nodules, approximately 20 to 25 in number, new. Dominant nodules include: - 5 mm left upper lobe nodule (image 41) - 7 mm left upper lobe nodule (image 53) - 4 mm right upper lobe nodule (image 55) - 6 mm right upper lobe nodule (image 70) No pleural effusion or pneumothorax. Numerous new scattered pulmonary nodules, measuring up to 7 mm in the left upper lobe, compatible with metastases.    Latest Ref Rng & Units 02/17/2024    5:51 AM 01/15/2024    1:11 PM 09/17/2023    1:32 PM  CBC  WBC 4.0 - 10.5 K/uL 5.9  4.7  4.8   Hemoglobin 13.0 - 17.0 g/dL 83.8  84.0  85.0   Hematocrit 39.0 - 52.0 % 48.0  46.1  44.6   Platelets 150 - 400 K/uL 168  159  170        Latest Ref Rng & Units 02/17/2024    5:51 AM 01/03/2024    9:44 AM 09/17/2023    1:32 PM  BMP  Glucose 70 - 99 mg/dL 891   895   BUN 8 - 23 mg/dL 12   15   Creatinine 9.38 - 1.24 mg/dL 9.03  8.89  9.08   Sodium 135 - 145 mmol/L 137   136   Potassium 3.5 - 5.1 mmol/L 4.2   4.3   Chloride 98 - 111 mmol/L 103   102   CO2 22 - 32 mmol/L 28   31   Calcium  8.9 - 10.3 mg/dL 9.3   9.1     BNP No results found for: BNP  ProBNP No results found for: PROBNP  PFT No results found for: FEV1PRE, FEV1POST, FVCPRE, FVCPOST, TLC, DLCOUNC, PREFEV1FVCRT, PSTFEV1FVCRT  DG Chest Port 1 View Result Date: 02/17/2024 CLINICAL DATA:  Head and neck cancer, status post bronchoscopy with biopsy (biopsies in the left upper lobe and right upper lobe). EXAM: PORTABLE CHEST 1 VIEW COMPARISON:  Chest CT 01/09/2024 FINDINGS: Faintly accentuated density in the vicinity of the biopsied right upper lobe lesion  potentially representing a small amount of perilesional blood products. Emphysema.  No pneumothorax or pneumomediastinum. Atherosclerotic calcification of the aortic arch. Lower cervical plate and screw fixator. IMPRESSION: 1. No pneumothorax or pneumomediastinum. 2. Faintly accentuated density in the vicinity of the biopsied right upper lobe lesion potentially representing a small amount of perilesional blood products. 3. Aortic Atherosclerosis (ICD10-I70.0) and Emphysema (ICD10-J43.9). 4. Electronically Signed   By: Ryan Salvage M.D.   On: 02/17/2024 09:33   DG C-ARM BRONCHOSCOPY Result Date: 02/17/2024 C-ARM BRONCHOSCOPY: Fluoroscopy was utilized by the requesting physician.  No radiographic interpretation.     Past medical hx Past Medical History:  Diagnosis Date   AAA (abdominal aortic aneurysm) (HCC)    Aortic stenosis  a. Moderate by echo 05/2020.   Arthritis    right hip   Cancer (HCC)    skin cancer nose, right left, upper left basel cell   Dysrhythmia    a-fib   Heart murmur    Malignant neoplasm of supraglottis (HCC) 05/2022   PAF (paroxysmal atrial fibrillation) (HCC)    Peripheral arterial disease (HCC)    Tobacco abuse      Social History   Tobacco Use   Smoking status: Former    Current packs/day: 0.50    Average packs/day: 0.5 packs/day for 50.0 years (25.0 ttl pk-yrs)    Types: Cigarettes    Passive exposure: Never   Smokeless tobacco: Never   Tobacco comments:    Quit smoking few months ago 01/23/2024  Vaping Use   Vaping status: Never Used  Substance Use Topics   Alcohol use: Yes    Alcohol/week: 0.0 standard drinks of alcohol    Comment: 4 to 5 times a week; beer, wine and liquor    Drug use: No    Mr.Latin reports that he has quit smoking. His smoking use included cigarettes. He has a 25 pack-year smoking history. He has never been exposed to tobacco smoke. He has never used smokeless tobacco. He reports current alcohol use. He reports that he  does not use drugs.  Tobacco Cessation: Counseling given: Not Answered Tobacco comments: Quit smoking few months ago 01/23/2024 Recently quit smoker with a 25 pack year smoking history  Past surgical hx, Family hx, Social hx all reviewed.  Current Outpatient Medications on File Prior to Visit  Medication Sig   acetaminophen  (TYLENOL ) 500 MG tablet Take 500 mg by mouth every 6 (six) hours as needed (for pain.).   cetirizine  (ZYRTEC ) 10 MG tablet Take 1 tablet (10 mg total) by mouth daily.   cholecalciferol  (VITAMIN D ) 25 MCG (1000 UNIT) tablet Take 1,000 Units by mouth 2 (two) times daily.   diltiazem  (TIADYLT  ER) 120 MG 24 hr capsule Take 1 capsule (120 mg total) by mouth daily.   fluticasone  (FLONASE ) 50 MCG/ACT nasal spray Place 2 sprays into both nostrils daily.   lidocaine -prilocaine  (EMLA ) cream Apply to affected area once   metoprolol  tartrate (LOPRESSOR ) 25 MG tablet Take 1 tablet by mouth twice daily as needed   nicotine  (NICODERM CQ  - DOSED IN MG/24 HOURS) 21 mg/24hr patch Place 1 patch (21 mg total) onto the skin daily. Apply 21 mg patch daily x 6 wk, then 14mg  patch daily x 2 wk, then 7 mg patch daily x 2 wk   omeprazole  (PRILOSEC) 40 MG capsule Take 1 capsule (40 mg total) by mouth daily.   rivaroxaban  (XARELTO ) 20 MG TABS tablet Take 1 tablet (20 mg total) by mouth daily with supper.   rosuvastatin  (CRESTOR ) 10 MG tablet Take 1 tablet by mouth once daily   sodium chloride  (OCEAN) 0.65 % SOLN nasal spray Place 1 spray into both nostrils as needed for congestion.   No current facility-administered medications on file prior to visit.     Allergies  Allergen Reactions   Zithromax  [Azithromycin ] Diarrhea   Chlorpheniramine Cough and Itching    Review Of Systems:  Constitutional:   No  weight loss, night sweats,  Fevers, chills, fatigue, or  lassitude.  HEENT:   No headaches,  Difficulty swallowing,  Tooth/dental problems, or  Sore throat,                No sneezing,  itching, ear ache, nasal congestion, post  nasal drip,   CV:  No chest pain,  Orthopnea, PND, swelling in lower extremities, anasarca, dizziness, palpitations, syncope.   GI  No heartburn, indigestion, abdominal pain, nausea, vomiting, diarrhea, change in bowel habits, loss of appetite, bloody stools.   Resp: + shortness of breath with exertion or at rest.  + excess clear mucus, no productive cough,  No non-productive cough,  No coughing up of blood.  No change in color of mucus.  No wheezing.  No chest wall deformity  Skin: no rash or lesions.  GU: no dysuria, change in color of urine, no urgency or frequency.  No flank pain, no hematuria   MS:  No joint pain or swelling.  No decreased range of motion.  No back pain.  Psych:  No change in mood or affect. No depression or anxiety.  No memory loss.   Vital Signs BP 118/66 (BP Location: Left Arm, Patient Position: Sitting, Cuff Size: Normal)   Pulse (!) 55   Ht 6' 4 (1.93 m)   Wt 208 lb (94.3 kg)   SpO2 95%   BMI 25.32 kg/m    Physical Exam:  General- No distress,  A&Ox3, pleasant ENT: No sinus tenderness, TM clear, pale nasal mucosa, no oral exudate,no post nasal drip, no LAN Cardiac: S1, S2, regular rate and rhythm, no murmur Chest: No wheeze/ rales/ dullness; no accessory muscle use, no nasal flaring, no sternal retractions, slightly diminished per bases Abd.: Soft Non-tender, ND, BS +, Body mass index is 25.32 kg/m.  Ext: No clubbing cyanosis, edema Neuro:  normal strength, MAE x 4, A&O x 3, appropriate Skin: No rashes, warm and dry, no obvious skin lesions Psych: normal mood and behavior   Assessment/Plan History of malignant neoplasm of the supraglottis 2023>> squamous cell Recently quit smoker with 25 pack year smoking history Treatment with radiation therapy and cisplatin  treatment. Surveillance imaging with numerous nodules concerning for metastatic disease. Dyspnea on exertion Plan I am glad you have done  well  after your biopsy.  We have reviewed your biopsy results. They were negative for malignancy. Plan will be for follow up CT Chest in October by Dr. Loretha to follow the pulmonary nodules of concern.  If they have changed in the interval , we will consider PET scan or repeat bronchoscopy. Call for unintentional weight loss or blood in your sputum, and we will see you sooner.  I have ordered PFT's to better evaluate your shortness of breath with exertion. You will get a call to get these scheduled.  We will give you some samples of Breztri . This is a maintenance inhaler . Take two puffs in the morning and 2 puffs in the evening. Rinse mouth after use.  Please contact office for sooner follow up if symptoms do not improve or worsen or seek emergency care    I spent 35 minutes dedicated to the care of this patient on the date of this encounter to include pre-visit review of records, face-to-face time with the patient discussing conditions above, post visit ordering of testing, clinical documentation with the electronic health record, making appropriate referrals as documented, and communicating necessary information to the patient's healthcare team.   Lauraine JULIANNA Lites, NP 02/25/2024  3:52 PM

## 2024-02-25 NOTE — Patient Instructions (Addendum)
 It is good to see you today.  I am glad you have done  well after your biopsy.  We have reviewed your biopsy results. They were negative for malignancy. Plan will be for follow up CT Chest in October by Dr. Loretha to follow the pulmonary nodules of concern.  If they have changed in the interval , we will consider PET scan or repeat bronchoscopy. Call for unintentional weight loss or blood in your sputum, and we will see you sooner.  I have ordered PFT's to better evaluate your shortness of breath with exertion. You will get a call to get these scheduled.  We will give you some samples of Breztri . This is a maintenance inhaler . Take two puffs in the morning and 2 puffs in the evening. Rinse mouth after use.  Please contact office for sooner follow up if symptoms do not improve or worsen or seek emergency care

## 2024-02-26 DIAGNOSIS — C44519 Basal cell carcinoma of skin of other part of trunk: Secondary | ICD-10-CM | POA: Diagnosis not present

## 2024-02-28 ENCOUNTER — Ambulatory Visit: Payer: Medicare (Managed Care)

## 2024-02-28 VITALS — Ht 76.0 in | Wt 208.0 lb

## 2024-02-28 DIAGNOSIS — Z0000 Encounter for general adult medical examination without abnormal findings: Secondary | ICD-10-CM

## 2024-02-28 NOTE — Progress Notes (Signed)
 Subjective:   Cory [name] is a 78 y.o. who presents for a Medicare Wellness preventive visit.  As a reminder, Annual Wellness Visits don't include a physical exam, and some assessments may be limited, especially if this visit is performed virtually. We may recommend an in-person follow-up visit with your provider if needed.  Visit Complete: Virtual I connected with  Cory [name] on 02/28/24 by a audio enabled telemedicine application and verified that I am speaking with the correct person using two identifiers.  Patient Location: Home  Provider Location: Home Office  I discussed the limitations of evaluation and management by telemedicine. The patient expressed understanding and agreed to proceed.  Vital Signs: Because this visit was a virtual/telehealth visit, some criteria may be missing or patient reported. Any vitals not documented were not able to be obtained and vitals that have been documented are patient reported.    Persons Participating in Visit: Patient.  AWV Questionnaire: Yes: Patient Medicare AWV questionnaire was completed by the patient on 02/07/24; I have confirmed that all information answered by patient is correct and no changes since this date.  Cardiac Risk Factors include: advanced age (>1men, >59 women);male gender;hypertension;dyslipidemia     Objective:    Today's Vitals   02/28/24 1131 02/28/24 1132  Weight: 208 lb (94.3 kg)   Height: 6' 4 (1.93 m)   PainSc:  0-No pain   Body mass index is 25.32 kg/m.     02/28/2024   11:35 AM 02/17/2024    6:22 AM 02/25/2023   11:16 AM 09/19/2022   11:55 AM 07/27/2022   10:59 AM 07/18/2022    8:00 AM 06/27/2022    9:46 AM  Advanced Directives  Does Patient Have a Medical Advance Directive? No No No No No No No  Does patient want to make changes to medical advance directive?     No - Patient declined No - Patient declined   Would patient like information on creating a medical advance directive? No - Patient  declined No - Patient declined No - Patient declined No - Patient declined       Current Medications (verified) Outpatient Encounter Medications as of 02/28/2024  Medication Sig   acetaminophen  (TYLENOL ) 500 MG tablet Take 500 mg by mouth every 6 (six) hours as needed (for pain.).   budesonide-glycopyrrolate -formoterol (BREZTRI  AEROSPHERE) 160-9-4.8 MCG/ACT AERO inhaler Inhale 2 puffs into the lungs in the morning and at bedtime.   cetirizine  (ZYRTEC ) 10 MG tablet Take 1 tablet (10 mg total) by mouth daily.   cholecalciferol  (VITAMIN D ) 25 MCG (1000 UNIT) tablet Take 1,000 Units by mouth 2 (two) times daily.   diltiazem  (TIADYLT  ER) 120 MG 24 hr capsule Take 1 capsule (120 mg total) by mouth daily.   fluticasone  (FLONASE ) 50 MCG/ACT nasal spray Place 2 sprays into both nostrils daily.   lidocaine -prilocaine  (EMLA ) cream Apply to affected area once   metoprolol  tartrate (LOPRESSOR ) 25 MG tablet Take 1 tablet by mouth twice daily as needed   nicotine  (NICODERM CQ  - DOSED IN MG/24 HOURS) 21 mg/24hr patch Place 1 patch (21 mg total) onto the skin daily. Apply 21 mg patch daily x 6 wk, then 14mg  patch daily x 2 wk, then 7 mg patch daily x 2 wk   omeprazole  (PRILOSEC) 40 MG capsule Take 1 capsule (40 mg total) by mouth daily.   rivaroxaban  (XARELTO ) 20 MG TABS tablet Take 1 tablet (20 mg total) by mouth daily with supper.   rosuvastatin  (CRESTOR ) 10  MG tablet Take 1 tablet by mouth once daily   sodium chloride  (OCEAN) 0.65 % SOLN nasal spray Place 1 spray into both nostrils as needed for congestion.   No facility-administered encounter medications on file as of 02/28/2024.    Allergies (verified) Zithromax  [azithromycin ] and Chlorpheniramine   History: Past Medical History:  Diagnosis Date   AAA (abdominal aortic aneurysm) (HCC)    Aortic stenosis    a. Moderate by echo 05/2020.   Arthritis    right hip   Cancer (HCC)    skin cancer nose, right left, upper left basel cell   Dysrhythmia     a-fib   Heart murmur    Malignant neoplasm of supraglottis (HCC) 05/2022   PAF (paroxysmal atrial fibrillation) (HCC)    Peripheral arterial disease (HCC)    Tobacco abuse    Past Surgical History:  Procedure Laterality Date   ABDOMINAL AORTIC ENDOVASCULAR STENT GRAFT N/A 08/29/2020   Procedure: ABDOMINAL AORTIC ENDOVASCULAR STENT GRAFT;  Surgeon: Harvey Carlin BRAVO, MD;  Location: [facility] OR;  Service: Vascular;  Laterality: N/A;   ABDOMINAL AORTOGRAM W/LOWER EXTREMITY Bilateral 06/24/2020   Procedure: ABDOMINAL AORTOGRAM W/LOWER EXTREMITY;  Surgeon: Harvey Carlin BRAVO, MD;  Location: [facility] INVASIVE CV LAB;  Service: Cardiovascular;  Laterality: Bilateral;   BACK SURGERY     2005   BIOPSY  05/21/2022   Procedure: BIOPSY;  Surgeon: Wilhelmenia Aloha Raddle., MD;  Location: [facility];  Service: Gastroenterology;;   BRONCHIAL BIOPSY  02/17/2024   Procedure: BRONCHOSCOPY, WITH BIOPSY;  Surgeon: Shelah Lamar RAMAN, MD;  Location: [facility] ENDOSCOPY;  Service: Pulmonary;;   BRONCHIAL BRUSHINGS  02/17/2024   Procedure: BRONCHOSCOPY, WITH BRUSH BIOPSY;  Surgeon: Shelah Lamar RAMAN, MD;  Location: [facility];  Service: Pulmonary;;   BRONCHIAL NEEDLE ASPIRATION BIOPSY  02/17/2024   Procedure: BRONCHOSCOPY, WITH NEEDLE ASPIRATION BIOPSY;  Surgeon: Shelah Lamar RAMAN, MD;  Location: [facility];  Service: Pulmonary;;   BRONCHIAL WASHINGS  02/17/2024   Procedure: IRRIGATION, BRONCHUS;  Surgeon: Shelah Lamar RAMAN, MD;  Location: [facility];  Service: Pulmonary;;   CERVICAL SPINE SURGERY     DIRECT LARYNGOSCOPY Bilateral 04/18/2022   Procedure: DIRECT LARYNGOSCOPY WITH BIOPSIES;  Surgeon: Carlie Clark, MD;  Location: [facility] OR;  Service: ENT;  Laterality: Bilateral;   EMBOLECTOMY Right 08/29/2020   Procedure: POPLITEAL AND TIBIAL EMBOLECTOMY;  Surgeon: Harvey Carlin BRAVO, MD;  Location: [facility] OR;  Service: Vascular;  Laterality: Right;   EMBOLIZATION (CATH LAB) Right 06/24/2020   Procedure: EMBOLIZATION;  Surgeon: Harvey Carlin BRAVO, MD;  Location: [facility];  Service: Cardiovascular;  Laterality: Right;   ENDARTERECTOMY FEMORAL Right 08/29/2020   Procedure: ENDARTERECTOMY FEMORAL;  Surgeon: Harvey Carlin BRAVO, MD;  Location: [facility] OR;  Service: Vascular;  Laterality: Right;   ESOPHAGOGASTRODUODENOSCOPY N/A 06/27/2022   Procedure: ESOPHAGOGASTRODUODENOSCOPY (EGD);  Surgeon: Wilhelmenia Aloha Raddle., MD;  Location: [facility];  Service: Gastroenterology;  Laterality: N/A;   ESOPHAGOGASTRODUODENOSCOPY (EGD) WITH PROPOFOL  N/A 05/21/2022   Procedure: ESOPHAGOGASTRODUODENOSCOPY (EGD) WITH PROPOFOL ;  Surgeon: Wilhelmenia Aloha Raddle., MD;  Location: [facility]COPY;  Service: Gastroenterology;  Laterality: N/A;   EUS N/A 05/21/2022   Procedure: UPPER ENDOSCOPIC ULTRASOUND (EUS) RADIAL;  Surgeon: Wilhelmenia Aloha Raddle., MD;  Location: [facility];  Service: Gastroenterology;  Laterality: N/A;   EUS N/A 06/27/2022   Procedure: UPPER ENDOSCOPIC ULTRASOUND (EUS) RADIAL;  Surgeon: Wilhelmenia Aloha Raddle., MD;  Location: [facility];  Service: Gastroenterology;  Laterality: N/A;   EYE SURGERY     FEMORAL ARTERY EXPLORATION Right  06/25/2020   Procedure: RIGHT GROIN EXPLORATION Evacuation of Hematoma  WITH REPAIR OF RIGHT Common FEMORAL ARTERY.;  Surgeon: Eliza Lonni RAMAN, MD;  Location: [facility] OR;  Service: Vascular;  Laterality: Right;   FEMORAL-POPLITEAL BYPASS GRAFT Left 11/22/2020   Procedure: LEFT ABOVE KNEE-BELOW KNEE BYPASS TIBIAL PERITONEAL TRUNK REVERSE IPSILATERAL GREATER SAPHENOUS VEIN;  Surgeon: Harvey Carlin BRAVO, MD;  Location: [facility] OR;  Service: Vascular;  Laterality: Left;   FINE NEEDLE ASPIRATION N/A 05/21/2022   Procedure: FINE NEEDLE ASPIRATION (FNA) LINEAR;  Surgeon: Wilhelmenia Aloha Raddle., MD;  Location: [facility];  Service: Gastroenterology;  Laterality: N/A;   FINE NEEDLE ASPIRATION  06/27/2022   Procedure: FINE NEEDLE ASPIRATION;  Surgeon: Wilhelmenia Aloha Raddle., MD;  Location: [facility];  Service:  Gastroenterology;;   HOT HEMOSTASIS N/A 06/27/2022   Procedure: HOT HEMOSTASIS (ARGON PLASMA COAGULATION/BICAP);  Surgeon: Wilhelmenia Aloha Raddle., MD;  Location: [facility];  Service: Gastroenterology;  Laterality: N/A;   IR GASTROSTOMY TUBE MOD SED  06/12/2022   IR GASTROSTOMY TUBE REMOVAL  09/04/2022   IR IMAGING GUIDED PORT INSERTION  06/12/2022   IR REMOVAL TUN ACCESS W/ PORT W/O FL MOD SED  11/21/2022   LEG SURGERY     Trauma   LOWER EXTREMITY ANGIOGRAPHY Left 11/17/2020   Procedure: Lower Extremity Angiography;  Surgeon: Gretta Lonni PARAS, MD;  Location: [facility] INVASIVE CV LAB;  Service: Cardiovascular;  Laterality: Left;   PATCH ANGIOPLASTY Right 08/29/2020   Procedure: PATCH ANGIOPLASTY RIGHT BELOW KNEE POPLITEAL ARTERY AND RIGHT COMMON FEMORAL ARTERY;  Surgeon: Harvey Carlin BRAVO, MD;  Location: [facility] OR;  Service: Vascular;  Laterality: Right;   RETINAL DETACHMENT SURGERY     RIGID ESOPHAGOSCOPY  04/18/2022   Procedure: RIGID ESOPHAGOSCOPY;  Surgeon: Carlie Clark, MD;  Location: [facility] OR;  Service: ENT;;   TONSILLECTOMY  04/18/2022   Procedure: LEFT TONSILLECTOMY;  Surgeon: Carlie Clark, MD;  Location: [facility] OR;  Service: ENT;;   ULTRASOUND GUIDANCE FOR VASCULAR ACCESS Bilateral 08/29/2020   Procedure: ULTRASOUND GUIDANCE FOR VASCULAR ACCESS;  Surgeon: Harvey Carlin BRAVO, MD;  Location: [facility] OR;  Service: Vascular;  Laterality: Bilateral;   VIDEO BRONCHOSCOPY WITH ENDOBRONCHIAL NAVIGATION Bilateral 02/17/2024   Procedure: VIDEO BRONCHOSCOPY WITH ENDOBRONCHIAL NAVIGATION;  Surgeon: Shelah Lamar RAMAN, MD;  Location: [facility]COPY;  Service: Pulmonary;  Laterality: Bilateral;   Family History  Problem Relation Age of Onset   Ovarian cancer Mother    Alcoholism Father    Breast cancer Sister    Ovarian cancer Sister    Social History   Socioeconomic History   Marital status: Married    Spouse name: Not on file   Number of children: 5   Years of education: Not on file   Highest education level:  Master's degree (e.g., MA, MS, MEng, MEd, MSW, MBA)  Occupational History   Not on file  Tobacco Use   Smoking status: Former    Current packs/day: 0.50    Average packs/day: 0.5 packs/day for 50.0 years (25.0 ttl pk-yrs)    Types: Cigarettes    Passive exposure: Never   Smokeless tobacco: Never   Tobacco comments:    Quit smoking few months ago 01/23/2024    Smoked 1 PPD x 60 years     60 pack year smoking history.  Vaping Use   Vaping status: Never Used  Substance and Sexual Activity   Alcohol use: Yes    Alcohol/week: 0.0 standard drinks of alcohol    Comment: 4 to 5 times a week; beer,  wine and liquor    Drug use: No   Sexual activity: Not Currently  Other Topics Concern   Not on file  Social History Narrative   Scores reading tests.  Lives with wife.     Social Drivers of Corporate investment banker Strain: Low Risk  (02/28/2024)   Overall Financial Resource Strain (CARDIA)    Difficulty of Paying Living Expenses: Not hard at all  Food Insecurity: No Food Insecurity (02/28/2024)   Hunger Vital Sign    Worried About Running Out of Food in the Last Year: Never true    Ran Out of Food in the Last Year: Never true  Transportation Needs: No Transportation Needs (02/28/2024)   PRAPARE - Administrator, Civil Service (Medical): No    Lack of Transportation (Non-Medical): No  Physical Activity: Insufficiently Active (02/28/2024)   Exercise Vital Sign    Days of Exercise per Week: 4 days    Minutes of Exercise per Session: 30 min  Stress: No Stress Concern Present (02/28/2024)   [employer] of Occupational Health - Occupational Stress Questionnaire    Feeling of Stress: Not at all  Social Connections: Moderately Isolated (02/28/2024)   Social Connection and Isolation Panel    Frequency of Communication with Friends and Family: More than three times a week    Frequency of Social Gatherings with Friends and Family: More than three times a week    Attends  Religious Services: Never    Database administrator or Organizations: No    Attends Engineer, structural: Not on file    Marital Status: Married    Tobacco Counseling Counseling given: Not Answered Tobacco comments: Quit smoking few months ago 01/23/2024 Smoked 1 PPD x 60 years  60 pack year smoking history.    Clinical Intake:  Pre-visit preparation completed: Yes  Pain : No/denies pain Pain Score: 0-No pain     BMI - recorded: 25.32 Nutritional Status: BMI 25 -29 Overweight Nutritional Risks: None Diabetes: No  Lab Results  Component Value Date   HGBA1C 5.6 01/09/2022     How often do you need to have someone help you when you read instructions, pamphlets, or other written materials from your doctor or pharmacy?: 1 - Never  Interpreter Needed?: No  Information entered by :: Cory Blush LPN   Activities of Daily Living      02/28/2024   11:34 AM 02/27/2024    1:29 PM  In your present state of health, do you have any difficulty performing the following activities:  Hearing? 0 0  Vision? 0 0  Difficulty concentrating or making decisions? 0 0  Walking or climbing stairs? 0 0  Dressing or bathing? 0 0  Doing errands, shopping? 0 0  Preparing Food and eating ? N N  Using the Toilet? N N  In the past six months, have you accidently leaked urine? N N  Do you have problems with loss of bowel control? N N  Managing your Medications? N N  Managing your Finances? N N  Housekeeping or managing your Housekeeping? N N    Patient Care Team: Micheal Wolm ORN, MD as PCP - General [name], MD as PCP - Cardiology (Cardiology) [name], [facility] (Inactive) as Pharmacist (Pharmacist) Malmfelt, Delon CROME, RN as Oncology Nurse Navigator Izell Domino, MD as Consulting Physician (Radiation Oncology) Loretha Ash, MD as Consulting Physician (Hematology and Oncology) Carlie Clark, MD as Consulting Physician (Otolaryngology)   I have  updated  your Care Teams any recent Medical Services you may have received from other providers in the past year.     Assessment:   This is a routine wellness examination for Cory [name].  Hearing/Vision screen Hearing Screening - Comments:: Denies hearing difficulties   Vision Screening - Comments:: Wears rx glasses - up to date with routine eye exams with  [facility]   Goals Addressed               This Visit's Progress     Increase physical activity (pt-stated)        Remain active       Depression Screen      02/28/2024   11:37 AM 02/25/2023   11:14 AM 12/01/2021    3:05 PM 06/07/2021    1:12 PM 06/07/2021    1:10 PM 03/16/2021   11:06 AM 03/01/2021    9:46 AM  PHQ 2/9 Scores  PHQ - 2 Score 0 0 0 0 0 0 0  PHQ- 9 Score   0        Fall Risk      02/28/2024   11:34 AM 02/27/2024    1:29 PM 05/06/2023    2:06 PM 02/25/2023   11:15 AM 02/24/2023    6:27 PM  Fall Risk   Falls in the past year? 0 0 0 0 0  Number falls in past yr: 0 0  0   Injury with Fall? 0 0  0   Risk for fall due to : No Fall Risks   No Fall Risks   Follow up Falls evaluation completed   Falls prevention discussed     MEDICARE RISK AT HOME:   Medicare Risk at Home Any stairs in or around the home?: Yes If so, are there any without handrails?: No Home free of loose throw rugs in walkways, pet beds, electrical cords, etc?: Yes Adequate lighting in your home to reduce risk of falls?: Yes Life alert?: No Use of a cane, walker or w/c?: No Grab bars in the bathroom?: Yes Shower chair or bench in shower?: No Elevated toilet seat or a handicapped toilet?: No  TIMED UP AND GO:  Was the test performed?  No  Cognitive Function: 6CIT completed        02/28/2024   11:35 AM 02/25/2023   11:16 AM  6CIT Screen  What Year? 0 points 0 points  What month? 0 points 0 points  What time? 0 points 0 points  Count back from 20 0 points 0 points  Months in reverse 0 points 0 points  Repeat phrase 0 points 0  points  Total Score 0 points 0 points    Immunizations Immunization History  Administered Date(s) Administered   Fluad Quad(high Dose 65+) 06/27/2020, 04/26/2021   Influenza Whole 07/14/2009   Influenza, High Dose Seasonal PF 08/16/2016, 06/05/2017, 04/17/2022   Influenza,inj,Quad PF,6+ Mos 08/09/2014, 04/29/2018   Influenza-Unspecified 09/23/2015   PFIZER(Purple Top)SARS-COV-2 Vaccination 08/28/2019, 09/18/2019   Pneumococcal Conjugate-13 06/05/2017   Pneumococcal Polysaccharide-23 07/14/2009, 04/01/2013   Td 08/07/2003   Zoster Recombinant(Shingrix) 04/17/2022    Screening Tests Health Maintenance  Topic Date Due   DTaP/Tdap/Td (2 - Tdap) 08/06/2013   Zoster Vaccines- Shingrix (2 of 2) 06/12/2022   INFLUENZA VACCINE  03/06/2024   Lung Cancer Screening  01/08/2025   Medicare Annual Wellness (AWV)  02/27/2025   Pneumococcal Vaccine: 50+ Years  Completed   Hepatitis B Vaccines  Aged Out   HPV VACCINES  Aged Out   Meningococcal B Vaccine  Aged Out   COVID-19 Vaccine  Discontinued    Health Maintenance  Health Maintenance Due  Topic Date Due   DTaP/Tdap/Td (2 - Tdap) 08/06/2013   Zoster Vaccines- Shingrix (2 of 2) 06/12/2022   Health Maintenance Items Addressed:   Additional Screening:  Vision Screening: Recommended annual ophthalmology exams for early detection of glaucoma and other disorders of the eye. Would you like a referral to an eye doctor? No    Dental Screening: Recommended annual dental exams for proper oral hygiene  Community Resource Referral / Chronic Care Management: CRR required this visit?  No   CCM required this visit?  No   Plan:    I have personally reviewed and noted the following in the patient's chart:   Medical and social history Use of alcohol, tobacco or illicit drugs  Current medications and supplements including opioid prescriptions. Patient is not currently taking opioid prescriptions. Functional ability and  status Nutritional status Physical activity Advanced directives List of other physicians Hospitalizations, surgeries, and ER visits in previous 12 months Vitals Screenings to include cognitive, depression, and falls Referrals and appointments  In addition, I have reviewed and discussed with patient certain preventive protocols, quality metrics, and best practice recommendations. A written personalized care plan for preventive services as well as general preventive health recommendations were provided to patient.   Cory LELON Blush, LPN   [id number]/74/[id number]   After Visit Summary: (MyChart) Due to this being a telephonic visit, the after visit summary with patients personalized plan was offered to patient via MyChart   Notes: Nothing significant to report at this time.

## 2024-02-28 NOTE — Patient Instructions (Addendum)
 [name] , Thank you for taking time out of your busy schedule to complete your Annual Wellness Visit with me. I enjoyed our conversation and look forward to speaking with you again next year. I, as well as your care team,  appreciate your ongoing commitment to your health goals. Please review the following plan we discussed and let me know if I can assist you in the future. Your Game plan/ To Do List    Referrals: If you haven't heard from the office you've been referred to, please reach out to them at the phone provided.   Follow up Visits: Next Medicare AWV with our clinical staff: 03/05/25 @ 11:20a   Have you seen your provider in the last 6 months (3 months if uncontrolled diabetes)? 07/23/23 Next Office Visit with your provider:   Clinician Recommendations:  Aim for 30 minutes of exercise or brisk walking, 6-8 glasses of water, and 5 servings of fruits and vegetables each day.       This is a list of the screening recommended for you and due dates:  Health Maintenance  Topic Date Due   DTaP/Tdap/Td vaccine (2 - Tdap) 08/06/2013   Zoster (Shingles) Vaccine (2 of 2) 06/12/2022   Flu Shot  03/06/2024   Screening for Lung Cancer  01/08/2025   Medicare Annual Wellness Visit  02/27/2025   Pneumococcal Vaccine for age over 59  Completed   Hepatitis B Vaccine  Aged Out   HPV Vaccine  Aged Out   Meningitis B Vaccine  Aged Out   COVID-19 Vaccine  Discontinued    Advanced directives: (Declined) Advance directive discussed with you today. Even though you declined this today, please call our office should you change your mind, and we can give you the proper paperwork for you to fill out. Advance Care Planning is important because it:  [x]  Makes sure you receive the medical care that is consistent with your values, goals, and preferences  [x]  It provides guidance to your family and loved ones and reduces their decisional burden about whether or not they are making the right decisions based  on your wishes.  Follow the link provided in your after visit summary or read over the paperwork we have mailed to you to help you started getting your Advance Directives in place. If you need assistance in completing these, please reach out to us  so that we can help you!  See attachments for Preventive Care and Fall Prevention Tips.

## 2024-02-29 ENCOUNTER — Other Ambulatory Visit: Payer: Self-pay

## 2024-03-01 ENCOUNTER — Encounter: Payer: Self-pay | Admitting: Acute Care

## 2024-03-03 NOTE — Progress Notes (Signed)
 I agree with the plans as outlined above.   Lamar Chris, MD, PhD 03/03/2024, 3:24 PM [facility] [phone] or if no answer before 7:00PM call [phone] For any issues after 7:00PM please call eLink [phone]

## 2024-03-04 ENCOUNTER — Other Ambulatory Visit: Payer: Self-pay | Admitting: Hematology and Oncology

## 2024-03-04 DIAGNOSIS — R918 Other nonspecific abnormal finding of lung field: Secondary | ICD-10-CM

## 2024-03-04 DIAGNOSIS — C321 Malignant neoplasm of supraglottis: Secondary | ICD-10-CM

## 2024-03-04 DIAGNOSIS — D479 Neoplasm of uncertain behavior of lymphoid, hematopoietic and related tissue, unspecified: Secondary | ICD-10-CM

## 2024-03-06 ENCOUNTER — Telehealth: Payer: Self-pay

## 2024-03-06 NOTE — Telephone Encounter (Signed)
 Left message to confirm appt for 8/4

## 2024-03-09 ENCOUNTER — Inpatient Hospital Stay: Payer: Medicare (Managed Care) | Attending: Hematology and Oncology | Admitting: Hematology and Oncology

## 2024-03-09 VITALS — BP 133/66 | HR 64 | Temp 98.0°F | Resp 18 | Wt 211.7 lb

## 2024-03-09 DIAGNOSIS — Z87891 Personal history of nicotine dependence: Secondary | ICD-10-CM | POA: Insufficient documentation

## 2024-03-09 DIAGNOSIS — C321 Malignant neoplasm of supraglottis: Secondary | ICD-10-CM | POA: Diagnosis not present

## 2024-03-09 DIAGNOSIS — R918 Other nonspecific abnormal finding of lung field: Secondary | ICD-10-CM | POA: Insufficient documentation

## 2024-03-09 DIAGNOSIS — Z8521 Personal history of malignant neoplasm of larynx: Secondary | ICD-10-CM | POA: Insufficient documentation

## 2024-03-09 NOTE — Progress Notes (Signed)
 [facility] CONSULT NOTE  Patient Care Team: Cory [name] ORN, MD as PCP - General Cory Agent, MD as PCP - Cardiology (Cardiology) Cory [name], [facility] (Inactive) as Pharmacist (Pharmacist) Malmfelt, Cory CROME, RN as Oncology Nurse Navigator Cory Domino, MD as Consulting Physician (Radiation Oncology) Cory Ash, MD as Consulting Physician (Hematology and Oncology) Cory Clark, MD as Consulting Physician (Otolaryngology)  CHIEF COMPLAINTS/PURPOSE OF CONSULTATION:  SCC supraglottis  ONCOLOGIC HISTORY: Initially presented to ENT with chronic left-sided sore throat for almost a year.  Initial evaluation did not show any possible causes for sore throat.  He was recommended PPI twice daily but went back to ENT with persistent sore throat he once again had a repeat fiberoptic exam which was stable.   CT neck done on November 08, 2021 did show subcentimeter focus of mucosal hyperenhancement along the posterior aspect of the uvula  laryngoscopy with direct visualization recommended to exclude a mucosal lesion at the site.  Additionally few tiny cystic-appearing foci along the left glossotonsillar sulcus.  Direct visualization recommended.  Nonspecific mildly enlarged right level 2 lymph node.  Multiple small calcific foci in the left palatine tonsil.Repeat fiberoptic exam was unremarkable hence Dr. 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  Tongue biopsy showed benign squamous mucosa with low to intermediate grade dysplasia. 05/07/2022: PET/CT showed asymmetric hypermetabolism in the left oropharynx and involving the left epiglottis.  Contralateral hypermetabolic level 2 cervical lymph nodes compatible with metastatic disease.  No hypermetabolic adenopathy in the left neck or in the chest.  1.2  cm short axis aortocaval lymph node in the upper abdomen is hypermetabolic metastatic disease is a concern.  This lymph node has increased from 0.7 cm on the study from 2022.  No other metastatic disease. Received radiation from 05/30/2022-07/18/2022 and weekly cisplatin  from 06/01/2022-07/18/2022.   INTERIM HISTORY:  Cory [name] returns for a follow up after completion of chemoradiation on 07/18/2022.   Discussed the use of AI scribe software for clinical note transcription with the patient, who gave verbal consent to proceed. 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  He is here for a follow-up regarding lung nodules that were previously identified. A biopsy was performed, which did not show any significant findings. A follow-up CT chest is scheduled for September.  No symptoms such as fevers, night sweats, appetite loss, weight loss, changes in breathing, cough, headaches, double vision, or falls. He maintains his weight and reports eating well.  His past medical history includes voice box cancer, which is noted to commonly metastasize to the lungs. There is no current treatment being administered for the lung nodules, and he is under observation to monitor any changes in his size or characteristics.  Rest of the pertinent 10 point ROS reviewed and neg.  Wt Readings from Last 3 Encounters:  03/09/24 211 lb 11.2 oz (96 kg)  02/28/24 208 lb (94.3 kg)  02/25/24 208 lb (94.3 kg)    MEDICAL HISTORY:  Past Medical History:  Diagnosis Date   AAA (abdominal aortic aneurysm) (HCC)    Aortic stenosis    a. Moderate by echo 05/2020.   Arthritis    right hip   Cancer (HCC)    skin cancer nose, right left, upper left basel cell  Dysrhythmia    a-fib   Heart murmur    Malignant neoplasm of supraglottis (HCC) 05/2022   PAF (paroxysmal atrial fibrillation) (HCC)    Peripheral arterial  disease (HCC)    Tobacco abuse     SURGICAL HISTORY: Past Surgical History:  Procedure Laterality Date   ABDOMINAL AORTIC ENDOVASCULAR STENT GRAFT N/A 08/29/2020   Procedure: ABDOMINAL AORTIC ENDOVASCULAR STENT GRAFT;  Surgeon: Harvey Carlin BRAVO, MD;  Location: [facility] OR;  Service: Vascular;  Laterality: N/A;   ABDOMINAL AORTOGRAM W/LOWER EXTREMITY Bilateral 06/24/2020   Procedure: ABDOMINAL AORTOGRAM W/LOWER EXTREMITY;  Surgeon: Harvey Carlin BRAVO, MD;  Location: [facility] INVASIVE CV LAB;  Service: Cardiovascular;  Laterality: Bilateral;   BACK SURGERY     2005   BIOPSY  05/21/2022   Procedure: BIOPSY;  Surgeon: Wilhelmenia Aloha Raddle., MD;  Location: [facility];  Service: Gastroenterology;;   BRONCHIAL BIOPSY  02/17/2024   Procedure: BRONCHOSCOPY, WITH BIOPSY;  Surgeon: Shelah Lamar RAMAN, MD;  Location: [facility] ENDOSCOPY;  Service: Pulmonary;;   BRONCHIAL BRUSHINGS  02/17/2024   Procedure: BRONCHOSCOPY, WITH BRUSH BIOPSY;  Surgeon: Shelah Lamar RAMAN, MD;  Location: [facility] ENDOSCOPY;  Service: Pulmonary;;   BRONCHIAL NEEDLE ASPIRATION BIOPSY  02/17/2024   Procedure: BRONCHOSCOPY, WITH NEEDLE ASPIRATION BIOPSY;  Surgeon: Shelah Lamar RAMAN, MD;  Location: [facility] ENDOSCOPY;  Service: Pulmonary;;   BRONCHIAL WASHINGS  02/17/2024   Procedure: IRRIGATION, BRONCHUS;  Surgeon: Shelah Lamar RAMAN, MD;  Location: [facility] ENDOSCOPY;  Service: Pulmonary;;   CERVICAL SPINE SURGERY     DIRECT LARYNGOSCOPY Bilateral 04/18/2022   Procedure: DIRECT LARYNGOSCOPY WITH BIOPSIES;  Surgeon: Cory Clark, MD;  Location: [facility] OR;  Service: ENT;  Laterality: Bilateral;   EMBOLECTOMY Right 08/29/2020   Procedure: POPLITEAL AND TIBIAL EMBOLECTOMY;  Surgeon: Harvey Carlin BRAVO, MD;  Location: [facility] OR;  Service: Vascular;  Laterality: Right;   EMBOLIZATION (CATH LAB) Right 06/24/2020   Procedure: EMBOLIZATION;  Surgeon: Harvey Carlin BRAVO, MD;  Location: [facility]VAS[facility] CV [facility];  Service: Cardiovascular;  Laterality: Right;   ENDARTERECTOMY FEMORAL Right 08/29/2020    Procedure: ENDARTERECTOMY FEMORAL;  Surgeon: Harvey Carlin BRAVO, MD;  Location: [facility] OR;  Service: Vascular;  Laterality: Right;   ESOPHAGOGASTRODUODENOSCOPY N/A 06/27/2022   Procedure: ESOPHAGOGASTRODUODENOSCOPY (EGD);  Surgeon: Wilhelmenia Aloha Raddle., MD;  Location: [facility];  Service: Gastroenterology;  Laterality: N/A;   ESOPHAGOGASTRODUODENOSCOPY (EGD) WITH PROPOFOL  N/A 05/21/2022   Procedure: ESOPHAGOGASTRODUODENOSCOPY (EGD) WITH PROPOFOL ;  Surgeon: Wilhelmenia Aloha Raddle., MD;  Location: [facility] ENDOSCOPY;  Service: Gastroenterology;  Laterality: N/A;   EUS N/A 05/21/2022   Procedure: UPPER ENDOSCOPIC ULTRASOUND (EUS) RADIAL;  Surgeon: Wilhelmenia Aloha Raddle., MD;  Location: [facility] ENDOSCOPY;  Service: Gastroenterology;  Laterality: N/A;   EUS N/A 06/27/2022   Procedure: UPPER ENDOSCOPIC ULTRASOUND (EUS) RADIAL;  Surgeon: Wilhelmenia Aloha Raddle., MD;  Location: [facility];  Service: Gastroenterology;  Laterality: N/A;   EYE SURGERY     FEMORAL ARTERY EXPLORATION Right 06/25/2020   Procedure: RIGHT GROIN EXPLORATION Evacuation of Hematoma  WITH REPAIR OF RIGHT Common FEMORAL ARTERY.;  Surgeon: Eliza Lonni RAMAN, MD;  Location: [facility] OR;  Service: Vascular;  Laterality: Right;   FEMORAL-POPLITEAL BYPASS GRAFT Left 11/22/2020   Procedure: LEFT ABOVE KNEE-BELOW KNEE BYPASS TIBIAL PERITONEAL TRUNK REVERSE IPSILATERAL GREATER SAPHENOUS VEIN;  Surgeon: Harvey Carlin BRAVO, MD;  Location: [facility] OR;  Service: Vascular;  Laterality: Left;   FINE NEEDLE ASPIRATION N/A 05/21/2022   Procedure: FINE NEEDLE ASPIRATION (FNA) LINEAR;  Surgeon: Wilhelmenia Aloha Raddle., MD;  Location: [facility];  Service: Gastroenterology;  Laterality: N/A;   FINE NEEDLE ASPIRATION  06/27/2022   Procedure: FINE NEEDLE ASPIRATION;  Surgeon: Wilhelmenia Aloha Raddle., MD;  Location: [facility];  Service: Gastroenterology;;   HOT HEMOSTASIS N/A 06/27/2022   Procedure: HOT HEMOSTASIS (ARGON PLASMA COAGULATION/BICAP);  Surgeon:  Wilhelmenia Aloha Raddle., MD;  Location: [facility];  Service: Gastroenterology;  Laterality: N/A;   IR GASTROSTOMY TUBE MOD SED  06/12/2022   IR GASTROSTOMY TUBE REMOVAL  09/04/2022   IR IMAGING GUIDED PORT INSERTION  06/12/2022   IR REMOVAL TUN ACCESS W/ PORT W/O FL MOD SED  11/21/2022   LEG SURGERY     Trauma   LOWER EXTREMITY ANGIOGRAPHY Left 11/17/2020   Procedure: Lower Extremity Angiography;  Surgeon: Gretta Lonni PARAS, MD;  Location: [facility] INVASIVE CV LAB;  Service: Cardiovascular;  Laterality: Left;   PATCH ANGIOPLASTY Right 08/29/2020   Procedure: PATCH ANGIOPLASTY RIGHT BELOW KNEE POPLITEAL ARTERY AND RIGHT COMMON FEMORAL ARTERY;  Surgeon: Harvey Carlin BRAVO, MD;  Location: [facility] OR;  Service: Vascular;  Laterality: Right;   RETINAL DETACHMENT SURGERY     RIGID ESOPHAGOSCOPY  04/18/2022   Procedure: RIGID ESOPHAGOSCOPY;  Surgeon: Cory Clark, MD;  Location: [facility] OR;  Service: ENT;;   TONSILLECTOMY  04/18/2022   Procedure: LEFT TONSILLECTOMY;  Surgeon: Cory Clark, MD;  Location: [facility] OR;  Service: ENT;;   ULTRASOUND GUIDANCE FOR VASCULAR ACCESS Bilateral 08/29/2020   Procedure: ULTRASOUND GUIDANCE FOR VASCULAR ACCESS;  Surgeon: Harvey Carlin BRAVO, MD;  Location: [facility] OR;  Service: Vascular;  Laterality: Bilateral;   VIDEO BRONCHOSCOPY WITH ENDOBRONCHIAL NAVIGATION Bilateral 02/17/2024   Procedure: VIDEO BRONCHOSCOPY WITH ENDOBRONCHIAL NAVIGATION;  Surgeon: Shelah Lamar RAMAN, MD;  Location: [facility] ENDOSCOPY;  Service: Pulmonary;  Laterality: Bilateral;    SOCIAL HISTORY: Social History   Socioeconomic History   Marital status: Married    Spouse name: Not on file   Number of children: 5   Years of education: Not on file   Highest education level: Master's degree (e.g., MA, MS, MEng, MEd, MSW, MBA)  Occupational History   Not on file  Tobacco Use   Smoking status: Former    Current packs/day: 0.50    Average packs/day: 0.5 packs/day for 50.0 years (25.0 ttl pk-yrs)    Types: Cigarettes     Passive exposure: Never   Smokeless tobacco: Never   Tobacco comments:    Quit smoking few months ago 01/23/2024    Smoked 1 PPD x 60 years     60 pack year smoking history.  Vaping Use   Vaping status: Never Used  Substance and Sexual Activity   Alcohol use: Yes    Alcohol/week: 0.0 standard drinks of alcohol    Comment: 4 to 5 times a week; beer, wine and liquor    Drug use: No   Sexual activity: Not Currently  Other Topics Concern   Not on file  Social History Narrative   Scores reading tests.  Lives with wife.     Social Drivers of Corporate investment banker Strain: Low Risk  (02/28/2024)   Overall Financial Resource Strain (CARDIA)    Difficulty of Paying Living Expenses: Not hard at all  Food Insecurity: No Food Insecurity (02/28/2024)   Hunger Vital Sign    Worried About Running Out of Food in the Last Year: Never true    Ran Out of Food in the Last Year: Never true  Transportation Needs: No Transportation Needs (02/28/2024)   PRAPARE - Administrator, Civil Service (Medical):  No    Lack of Transportation (Non-Medical): No  Physical Activity: Insufficiently Active (02/28/2024)   Exercise Vital Sign    Days of Exercise per Week: 4 days    Minutes of Exercise per Session: 30 min  Stress: No Stress Concern Present (02/28/2024)   [employer] of Occupational Health - Occupational Stress Questionnaire    Feeling of Stress: Not at all  Social Connections: Moderately Isolated (02/28/2024)   Social Connection and Isolation Panel    Frequency of Communication with Friends and Family: More than three times a week    Frequency of Social Gatherings with Friends and Family: More than three times a week    Attends Religious Services: Never    Database administrator or Organizations: No    Attends Engineer, structural: Not on file    Marital Status: Married  Catering manager Violence: Not At Risk (02/28/2024)   Humiliation, Afraid, Rape, and Kick  questionnaire    Fear of Current or Ex-Partner: No    Emotionally Abused: No    Physically Abused: No    Sexually Abused: No    FAMILY HISTORY: Family History  Problem Relation Age of Onset   Ovarian cancer Mother    Alcoholism Father    Breast cancer Sister    Ovarian cancer Sister     ALLERGIES:  is allergic to zithromax  [azithromycin ] and chlorpheniramine.  MEDICATIONS:  Current Outpatient Medications  Medication Sig Dispense Refill   acetaminophen  (TYLENOL ) 500 MG tablet Take 500 mg by mouth every 6 (six) hours as needed (for pain.).     budesonide-glycopyrrolate -formoterol (BREZTRI  AEROSPHERE) 160-9-4.8 MCG/ACT AERO inhaler Inhale 2 puffs into the lungs in the morning and at bedtime.     cetirizine  (ZYRTEC ) 10 MG tablet Take 1 tablet (10 mg total) by mouth daily. 30 tablet 11   cholecalciferol  (VITAMIN D ) 25 MCG (1000 UNIT) tablet Take 1,000 Units by mouth 2 (two) times daily.     diltiazem  (TIADYLT  ER) 120 MG 24 hr capsule Take 1 capsule (120 mg total) by mouth daily. 90 capsule 1   fluticasone  (FLONASE ) 50 MCG/ACT nasal spray Place 2 sprays into both nostrils daily. 16 g 6   lidocaine -prilocaine  (EMLA ) cream Apply to affected area once 30 g 3   metoprolol  tartrate (LOPRESSOR ) 25 MG tablet Take 1 tablet by mouth twice daily as needed 180 tablet 1   nicotine  (NICODERM CQ  - DOSED IN MG/24 HOURS) 21 mg/24hr patch Place 1 patch (21 mg total) onto the skin daily. Apply 21 mg patch daily x 6 wk, then 14mg  patch daily x 2 wk, then 7 mg patch daily x 2 wk 14 patch 2   omeprazole  (PRILOSEC) 40 MG capsule Take 1 capsule (40 mg total) by mouth daily. 30 capsule 6   rivaroxaban  (XARELTO ) 20 MG TABS tablet Take 1 tablet (20 mg total) by mouth daily with supper. 90 tablet 1   rosuvastatin  (CRESTOR ) 10 MG tablet Take 1 tablet by mouth once daily 30 tablet 0   sodium chloride  (OCEAN) 0.65 % SOLN nasal spray Place 1 spray into both nostrils as needed for congestion.     No current  facility-administered medications for this visit.     PHYSICAL EXAMINATION: ECOG PERFORMANCE STATUS: 1 - Symptomatic but completely ambulatory  Vitals:   03/09/24 0927  BP: 133/66  Pulse: 64  Resp: 18  Temp: 98 F (36.7 C)  SpO2: 97%    Filed Weights   03/09/24 0927  Weight: 211 lb 11.2  oz (96 kg)     Physical Exam Constitutional:      Appearance: Normal appearance.  Cardiovascular:     Rate and Rhythm: Normal rate and regular rhythm.  Pulmonary:     Effort: Pulmonary effort is normal.     Breath sounds: Normal breath sounds.  Abdominal:     Comments:    Musculoskeletal:        General: No swelling or tenderness. Normal range of motion.     Cervical back: Normal range of motion and neck supple. No rigidity.  Lymphadenopathy:     Cervical: No cervical adenopathy.  Skin:    General: Skin is warm and dry.  Neurological:     General: No focal deficit present.     Mental Status: He is alert.      LABORATORY DATA:  I have reviewed the data as listed Lab Results  Component Value Date   WBC 5.9 02/17/2024   HGB 16.1 02/17/2024   HCT 48.0 02/17/2024   MCV 100.6 (H) 02/17/2024   PLT 168 02/17/2024     Chemistry      Component Value Date/Time   NA 137 02/17/2024 0551   NA 141 05/16/2018 1425   K 4.2 02/17/2024 0551   CL 103 02/17/2024 0551   CO2 28 02/17/2024 0551   BUN 12 02/17/2024 0551   BUN 13 05/16/2018 1425   CREATININE 0.96 02/17/2024 0551   CREATININE 0.91 09/17/2023 1332      Component Value Date/Time   CALCIUM  9.3 02/17/2024 0551   ALKPHOS 36 (L) 09/17/2023 1332   AST 14 (L) 09/17/2023 1332   ALT 14 09/17/2023 1332   BILITOT 0.4 09/17/2023 1332       RADIOGRAPHIC STUDIES: I have personally reviewed the radiological images as listed and agreed with the findings in the report. DG Chest Port 1 View Result Date: 02/17/2024 CLINICAL DATA:  Head and neck cancer, status post bronchoscopy with biopsy (biopsies in the left upper lobe and right  upper lobe). EXAM: PORTABLE CHEST 1 VIEW COMPARISON:  Chest CT 01/09/2024 FINDINGS: Faintly accentuated density in the vicinity of the biopsied right upper lobe lesion potentially representing a small amount of perilesional blood products. Emphysema.  No pneumothorax or pneumomediastinum. Atherosclerotic calcification of the aortic arch. Lower cervical plate and screw fixator. IMPRESSION: 1. No pneumothorax or pneumomediastinum. 2. Faintly accentuated density in the vicinity of the biopsied right upper lobe lesion potentially representing a small amount of perilesional blood products. 3. Aortic Atherosclerosis (ICD10-I70.0) and Emphysema (ICD10-J43.9). 4. Electronically Signed   By: Ryan Salvage M.D.   On: 02/17/2024 09:33   DG C-ARM BRONCHOSCOPY Result Date: 02/17/2024 C-ARM BRONCHOSCOPY: Fluoroscopy was utilized by the requesting physician.  No radiographic interpretation.    ASSESSMENT AND PLAN: Assessment and Plan Assessment & Plan  Lung nodules under surveillance Lung nodules are small and asymptomatic, likely related to laryngeal cancer.  Recent biopsy neg,likely false neg. Recommendation was to repeat imaging, this has been ordered.  - Order CT chest in September to monitor lung nodules. - Schedule follow-up appointment in mid-September to review CT results. - Instruct to report any new symptoms immediately.  History of laryngeal cancer Potential lung metastasis without active disease symptoms. Treatment options include chemotherapy or chemo-immunotherapy if nodules are metastatic and show significant growth.  Lymphoproliferative disorder Multiple biopsies couldn't establish the exact type Most recent labs with no concerns. We will follow up CBC, CMP and LDH every 3/4 months  I have spent a total of  30 minutes minutes of face-to-face and non-face-to-face time, preparing to see the patient, performing a medically appropriate examination, counseling and educating the patient,  documenting clinical information in the electronic health record, and care coordination.   Amber Stalls MD

## 2024-03-13 ENCOUNTER — Other Ambulatory Visit (HOSPITAL_BASED_OUTPATIENT_CLINIC_OR_DEPARTMENT_OTHER): Payer: Medicare (Managed Care)

## 2024-03-13 ENCOUNTER — Ambulatory Visit (HOSPITAL_BASED_OUTPATIENT_CLINIC_OR_DEPARTMENT_OTHER): Payer: Medicare (Managed Care)

## 2024-03-16 ENCOUNTER — Telehealth: Payer: Self-pay

## 2024-03-16 NOTE — Telephone Encounter (Signed)
 Copied from CRM [id number]. Topic: Clinical - Medication Question >> Mar 16, 2024 12:31 PM Cory [name] wrote: Reason for CRM: Patient is calling to request an official prescription for budesonide-glycopyrrolate -formoterol (BREZTRI  AEROSPHERE) 160-9-4.8 MCG/ACT AERO inhaler, as it is working well for him. Requests that it go to the [facility] on [address] in [place].  Lauraine, are you okay with prescribing the Breztri ?

## 2024-03-18 ENCOUNTER — Other Ambulatory Visit: Payer: Self-pay | Admitting: Acute Care

## 2024-03-18 DIAGNOSIS — J449 Chronic obstructive pulmonary disease, unspecified: Secondary | ICD-10-CM

## 2024-03-18 MED ORDER — BREZTRI AEROSPHERE 160-9-4.8 MCG/ACT IN AERO
2.0000 | INHALATION_SPRAY | Freq: Two times a day (BID) | RESPIRATORY_TRACT | 6 refills | Status: DC
Start: 2024-03-18 — End: 2024-06-24

## 2024-03-19 ENCOUNTER — Other Ambulatory Visit: Payer: Self-pay

## 2024-03-19 ENCOUNTER — Telehealth: Payer: Self-pay

## 2024-03-19 NOTE — Telephone Encounter (Signed)
 Pt is aware. Nfn

## 2024-03-19 NOTE — Telephone Encounter (Signed)
 Copied from CRM #[id number]. Topic: Clinical - Prescription Issue >> Mar 19, 2024  2:07 PM Rozanna MATSU wrote: Reason for [id number]ED STATED THE budesonide-glycopyrrolate -formoterol (BREZTRI  AEROSPHERE) 160-9-4.8 MCG/ACT AERO inhaler IS NOT CORRECT WITH PT INSURANCE, THEY WILL NEED A NEW PRESCRIPTION WITH ALTERNATE MED

## 2024-03-20 ENCOUNTER — Telehealth: Payer: Self-pay

## 2024-03-20 ENCOUNTER — Other Ambulatory Visit (HOSPITAL_COMMUNITY): Payer: Self-pay

## 2024-03-20 ENCOUNTER — Encounter: Payer: Self-pay | Admitting: Hematology and Oncology

## 2024-03-20 NOTE — Telephone Encounter (Signed)
 Patient's insurance covers Per test claims Trelegy is preferred and covered at a copay of $47.00 (with [facility])   Cory [name] can you please advise ok to send a script in for Trelegy for patient .

## 2024-03-20 NOTE — Telephone Encounter (Signed)
 Sent to Lauraine who saw him last

## 2024-03-20 NOTE — Telephone Encounter (Signed)
*  Pulm  Pharmacy Patient Advocate Encounter   Received notification from Pt Calls Messages that prior authorization for Breztri  is required/requested.   Insurance verification completed.   The patient is insured through [employer] .   Per test claim:  Trelegy is preferred by the insurance.  If suggested medication is appropriate, Please send in a new RX and discontinue this one. If not, please advise as to why it's not appropriate so that we may request a Prior Authorization. Please note, some preferred medications may still require a PA.  If the suggested medications have not been trialed and there are no contraindications to their use, the PA will not be submitted, as it will not be approved.  *message sent in original request

## 2024-03-23 ENCOUNTER — Other Ambulatory Visit: Payer: Self-pay | Admitting: Acute Care

## 2024-03-23 ENCOUNTER — Telehealth: Payer: Self-pay

## 2024-03-23 ENCOUNTER — Other Ambulatory Visit (HOSPITAL_COMMUNITY): Payer: Self-pay

## 2024-03-23 DIAGNOSIS — J449 Chronic obstructive pulmonary disease, unspecified: Secondary | ICD-10-CM

## 2024-03-23 MED ORDER — TRELEGY ELLIPTA 100-62.5-25 MCG/ACT IN AEPB
1.0000 | INHALATION_SPRAY | Freq: Every day | RESPIRATORY_TRACT | 0 refills | Status: DC
  Filled 2024-03-23: qty 60, 30d supply, fill #0

## 2024-03-23 NOTE — Telephone Encounter (Signed)
 LVM to call office and schedule f/u CT and f/u appt with Ruthell, NP to review results.

## 2024-03-27 ENCOUNTER — Ambulatory Visit: Payer: Medicare (Managed Care) | Admitting: Emergency Medicine

## 2024-03-27 DIAGNOSIS — R0609 Other forms of dyspnea: Secondary | ICD-10-CM | POA: Diagnosis not present

## 2024-03-27 LAB — PULMONARY FUNCTION TEST
DL/VA % pred: 74 %
DL/VA: 2.84 ml/min/mmHg/L
DLCO cor % pred: 61 %
DLCO cor: 16.96 ml/min/mmHg
DLCO unc % pred: 64 %
DLCO unc: 17.64 ml/min/mmHg
FEF 25-75 Post: 0.67 L/s
FEF 25-75 Pre: 0.68 L/s
FEF2575-%Change-Post: -1 %
FEF2575-%Pred-Post: 28 %
FEF2575-%Pred-Pre: 28 %
FEV1-%Change-Post: 3 %
FEV1-%Pred-Post: 55 %
FEV1-%Pred-Pre: 53 %
FEV1-Post: 1.87 L
FEV1-Pre: 1.8 L
FEV1FVC-%Change-Post: 2 %
FEV1FVC-%Pred-Pre: 73 %
FEV6-%Change-Post: 1 %
FEV6-%Pred-Post: 75 %
FEV6-%Pred-Pre: 74 %
FEV6-Post: 3.35 L
FEV6-Pre: 3.28 L
FEV6FVC-%Change-Post: 2 %
FEV6FVC-%Pred-Post: 103 %
FEV6FVC-%Pred-Pre: 101 %
FVC-%Change-Post: 1 %
FVC-%Pred-Post: 73 %
FVC-%Pred-Pre: 72 %
FVC-Post: 3.49 L
FVC-Pre: 3.44 L
Post FEV1/FVC ratio: 54 %
Post FEV6/FVC ratio: 97 %
Pre FEV1/FVC ratio: 52 %
Pre FEV6/FVC Ratio: 95 %
RV % pred: 249 %
RV: 7.17 L
TLC % pred: 138 %
TLC: 10.93 L

## 2024-03-27 NOTE — Progress Notes (Signed)
 Full PFT performed today.

## 2024-03-27 NOTE — Patient Instructions (Signed)
 Full PFT performed today.

## 2024-03-31 ENCOUNTER — Ambulatory Visit: Payer: Medicare (Managed Care) | Admitting: Acute Care

## 2024-04-15 ENCOUNTER — Ambulatory Visit: Payer: Self-pay | Admitting: Family Medicine

## 2024-04-15 ENCOUNTER — Encounter: Payer: Self-pay | Admitting: Family Medicine

## 2024-04-15 ENCOUNTER — Ambulatory Visit (INDEPENDENT_AMBULATORY_CARE_PROVIDER_SITE_OTHER): Payer: Medicare (Managed Care) | Admitting: Family Medicine

## 2024-04-15 VITALS — BP 146/68 | HR 55 | Temp 97.6°F | Wt 211.7 lb

## 2024-04-15 DIAGNOSIS — R229 Localized swelling, mass and lump, unspecified: Secondary | ICD-10-CM | POA: Diagnosis not present

## 2024-04-15 DIAGNOSIS — E785 Hyperlipidemia, unspecified: Secondary | ICD-10-CM | POA: Diagnosis not present

## 2024-04-15 DIAGNOSIS — G5701 Lesion of sciatic nerve, right lower limb: Secondary | ICD-10-CM

## 2024-04-15 DIAGNOSIS — Z23 Encounter for immunization: Secondary | ICD-10-CM | POA: Diagnosis not present

## 2024-04-15 LAB — COMPREHENSIVE METABOLIC PANEL WITH GFR
ALT: 15 U/L (ref 0–53)
AST: 14 U/L (ref 0–37)
Albumin: 4 g/dL (ref 3.5–5.2)
Alkaline Phosphatase: 35 U/L — ABNORMAL LOW (ref 39–117)
BUN: 17 mg/dL (ref 6–23)
CO2: 28 meq/L (ref 19–32)
Calcium: 9.5 mg/dL (ref 8.4–10.5)
Chloride: 102 meq/L (ref 96–112)
Creatinine, Ser: 0.95 mg/dL (ref 0.40–1.50)
GFR: 75.78 mL/min (ref >60.00)
Glucose, Bld: 99 mg/dL (ref 70–99)
Potassium: 4.5 meq/L (ref 3.5–5.1)
Sodium: 136 meq/L (ref 135–145)
Total Bilirubin: 0.6 mg/dL (ref 0.2–1.2)
Total Protein: 6.6 g/dL (ref 6.0–8.3)

## 2024-04-15 LAB — LIPID PANEL
Cholesterol: 173 mg/dL (ref 0–200)
HDL: 48.2 mg/dL (ref >39.00)
LDL Cholesterol: 110 mg/dL — ABNORMAL HIGH (ref 0–99)
NonHDL: 124.82
Total CHOL/HDL Ratio: 4
Triglycerides: 75 mg/dL (ref 0.0–149.0)
VLDL: 15 mg/dL (ref 0.0–40.0)

## 2024-04-15 NOTE — Patient Instructions (Signed)
 Consider RSV vaccine this Fall  I am setting up dermatology referral.

## 2024-04-15 NOTE — Progress Notes (Signed)
 Established Patient Office Visit  Subjective   Patient ID: Cory [name], male    DOB: 02-11-1946  Age: 78 y.o. MRN: [id number]  Chief Complaint  Patient presents with   Fall   Arm Injury    HPI   Cory [name] is seen for medical follow-up.  I have not seen him in over 2 years.  He has history of abdominal aortic aneurysm, peripheral vascular disease, atrial fibrillation, hypertension, history of cancer supraglottis, history of recent lung nodules with negative biopsy with pending follow-up CT lung in October, dyslipidemia, longstanding history of nicotine  use.  He relates fall this past August.  [place] wooden deck area.  Had long laceration left forearm.  This was irrigated well.  No obvious foreign bodies.  Healing well but about a week ago noticed a fairly large lump near 1 end of the laceration.  Nontender.  No drainage.  No erythema.  Patient requesting handicap form.  This is related to orthopedic issues with chronic piriformis syndrome right side.  He has seen sports medicine and has done exercises for years without much benefit.  Hyperlipidemia.  Patient has been on Crestor  in the past but ran out.  Requesting repeat lipids today.  Past Medical History:  Diagnosis Date   AAA (abdominal aortic aneurysm) (HCC)    Aortic stenosis    a. Moderate by echo 05/2020.   Arthritis    right hip   Cancer (HCC)    skin cancer nose, right left, upper left basel cell   Dysrhythmia    a-fib   Heart murmur    Malignant neoplasm of supraglottis (HCC) 05/2022   PAF (paroxysmal atrial fibrillation) (HCC)    Peripheral arterial disease (HCC)    Tobacco abuse    Past Surgical History:  Procedure Laterality Date   ABDOMINAL AORTIC ENDOVASCULAR STENT GRAFT N/A 08/29/2020   Procedure: ABDOMINAL AORTIC ENDOVASCULAR STENT GRAFT;  Surgeon: Harvey Carlin BRAVO, MD;  Location: [facility] OR;  Service: Vascular;  Laterality: N/A;   ABDOMINAL AORTOGRAM W/LOWER  EXTREMITY Bilateral 06/24/2020   Procedure: ABDOMINAL AORTOGRAM W/LOWER EXTREMITY;  Surgeon: Harvey Carlin BRAVO, MD;  Location: [facility];  Service: Cardiovascular;  Laterality: Bilateral;   BACK SURGERY     2005   BIOPSY  05/21/2022   Procedure: BIOPSY;  Surgeon: Wilhelmenia Aloha Raddle., MD;  Location: [facility];  Service: Gastroenterology;;   BRONCHIAL BIOPSY  02/17/2024   Procedure: BRONCHOSCOPY, WITH BIOPSY;  Surgeon: Shelah Lamar GORMAN, MD;  Location: [facility] ENDOSCOPY;  Service: Pulmonary;;   BRONCHIAL BRUSHINGS  02/17/2024   Procedure: BRONCHOSCOPY, WITH BRUSH BIOPSY;  Surgeon: Shelah Lamar GORMAN, MD;  Location: [facility];  Service: Pulmonary;;   BRONCHIAL NEEDLE ASPIRATION BIOPSY  02/17/2024   Procedure: BRONCHOSCOPY, WITH NEEDLE ASPIRATION BIOPSY;  Surgeon: Shelah Lamar GORMAN, MD;  Location: [facility];  Service: Pulmonary;;   BRONCHIAL WASHINGS  02/17/2024   Procedure: IRRIGATION, BRONCHUS;  Surgeon: Shelah Lamar GORMAN, MD;  Location: [facility];  Service: Pulmonary;;   CERVICAL SPINE SURGERY     DIRECT LARYNGOSCOPY Bilateral 04/18/2022   Procedure: DIRECT LARYNGOSCOPY WITH BIOPSIES;  Surgeon: Carlie Clark, MD;  Location: [facility] OR;  Service: ENT;  Laterality: Bilateral;   EMBOLECTOMY Right 08/29/2020   Procedure: POPLITEAL AND TIBIAL EMBOLECTOMY;  Surgeon: Harvey Carlin BRAVO, MD;  Location: [facility] OR;  Service: Vascular;  Laterality: Right;   EMBOLIZATION (CATH LAB) Right 06/24/2020   Procedure: EMBOLIZATION;  Surgeon: Harvey Carlin  E, MD;  Location: [facility];  Service: Cardiovascular;  Laterality: Right;   ENDARTERECTOMY FEMORAL Right 08/29/2020   Procedure: ENDARTERECTOMY FEMORAL;  Surgeon: Harvey Carlin BRAVO, MD;  Location: [facility] OR;  Service: Vascular;  Laterality: Right;   ESOPHAGOGASTRODUODENOSCOPY N/A 06/27/2022   Procedure: ESOPHAGOGASTRODUODENOSCOPY (EGD);  Surgeon: Wilhelmenia Aloha Raddle., MD;  Location: [facility];  Service: Gastroenterology;  Laterality: N/A;    ESOPHAGOGASTRODUODENOSCOPY (EGD) WITH PROPOFOL  N/A 05/21/2022   Procedure: ESOPHAGOGASTRODUODENOSCOPY (EGD) WITH PROPOFOL ;  Surgeon: Wilhelmenia Aloha Raddle., MD;  Location: [facility];  Service: Gastroenterology;  Laterality: N/A;   EUS N/A 05/21/2022   Procedure: UPPER ENDOSCOPIC ULTRASOUND (EUS) RADIAL;  Surgeon: Wilhelmenia Aloha Raddle., MD;  Location: [facility];  Service: Gastroenterology;  Laterality: N/A;   EUS N/A 06/27/2022   Procedure: UPPER ENDOSCOPIC ULTRASOUND (EUS) RADIAL;  Surgeon: Wilhelmenia Aloha Raddle., MD;  Location: [facility];  Service: Gastroenterology;  Laterality: N/A;   EYE SURGERY     FEMORAL ARTERY EXPLORATION Right 06/25/2020   Procedure: RIGHT GROIN EXPLORATION Evacuation of Hematoma  WITH REPAIR OF RIGHT Common FEMORAL ARTERY.;  Surgeon: Eliza Lonni RAMAN, MD;  Location: [facility] OR;  Service: Vascular;  Laterality: Right;   FEMORAL-POPLITEAL BYPASS GRAFT Left 11/22/2020   Procedure: LEFT ABOVE KNEE-BELOW KNEE BYPASS TIBIAL PERITONEAL TRUNK REVERSE IPSILATERAL GREATER SAPHENOUS VEIN;  Surgeon: Harvey Carlin BRAVO, MD;  Location: [facility] OR;  Service: Vascular;  Laterality: Left;   FINE NEEDLE ASPIRATION N/A 05/21/2022   Procedure: FINE NEEDLE ASPIRATION (FNA) LINEAR;  Surgeon: Wilhelmenia Aloha Raddle., MD;  Location: [facility];  Service: Gastroenterology;  Laterality: N/A;   FINE NEEDLE ASPIRATION  06/27/2022   Procedure: FINE NEEDLE ASPIRATION;  Surgeon: Wilhelmenia Aloha Raddle., MD;  Location: [facility];  Service: Gastroenterology;;   HOT HEMOSTASIS N/A 06/27/2022   Procedure: HOT HEMOSTASIS (ARGON PLASMA COAGULATION/BICAP);  Surgeon: Wilhelmenia Aloha Raddle., MD;  Location: [facility];  Service: Gastroenterology;  Laterality: N/A;   IR GASTROSTOMY TUBE MOD SED  06/12/2022   IR GASTROSTOMY TUBE REMOVAL  09/04/2022   IR IMAGING GUIDED PORT INSERTION  06/12/2022   IR REMOVAL TUN ACCESS W/ PORT W/O FL MOD SED  11/21/2022   LEG SURGERY     Trauma   LOWER EXTREMITY  ANGIOGRAPHY Left 11/17/2020   Procedure: Lower Extremity Angiography;  Surgeon: Gretta Lonni PARAS, MD;  Location: [facility] INVASIVE CV LAB;  Service: Cardiovascular;  Laterality: Left;   PATCH ANGIOPLASTY Right 08/29/2020   Procedure: PATCH ANGIOPLASTY RIGHT BELOW KNEE POPLITEAL ARTERY AND RIGHT COMMON FEMORAL ARTERY;  Surgeon: Harvey Carlin BRAVO, MD;  Location: [facility] OR;  Service: Vascular;  Laterality: Right;   RETINAL DETACHMENT SURGERY     RIGID ESOPHAGOSCOPY  04/18/2022   Procedure: RIGID ESOPHAGOSCOPY;  Surgeon: Carlie Clark, MD;  Location: [facility] OR;  Service: ENT;;   TONSILLECTOMY  04/18/2022   Procedure: LEFT TONSILLECTOMY;  Surgeon: Carlie Clark, MD;  Location: [facility] OR;  Service: ENT;;   ULTRASOUND GUIDANCE FOR VASCULAR ACCESS Bilateral 08/29/2020   Procedure: ULTRASOUND GUIDANCE FOR VASCULAR ACCESS;  Surgeon: Harvey Carlin BRAVO, MD;  Location: [facility] OR;  Service: Vascular;  Laterality: Bilateral;   VIDEO BRONCHOSCOPY WITH ENDOBRONCHIAL NAVIGATION Bilateral 02/17/2024   Procedure: VIDEO BRONCHOSCOPY WITH ENDOBRONCHIAL NAVIGATION;  Surgeon: Shelah Lamar RAMAN, MD;  Location: [facility];  Service: Pulmonary;  Laterality: Bilateral;    reports that he has quit smoking. His smoking use included cigarettes. He has a 25 pack-year smoking history. He has never been exposed to tobacco smoke. He has never used smokeless tobacco.  He reports current alcohol use. He reports that he does not use drugs. family history includes Alcoholism in his father; Breast cancer in his sister; Ovarian cancer in his mother and sister. Allergies  Allergen Reactions   Zithromax  [Azithromycin ] Diarrhea   Chlorpheniramine Cough and Itching    Review of Systems  Constitutional:  Negative for chills, fever and malaise/fatigue.  Eyes:  Negative for blurred vision.  Cardiovascular:  Negative for chest pain.  Neurological:  Negative for dizziness, weakness and headaches.      Objective:     BP (!) 146/68   Pulse (!) 55   Temp  97.6 F (36.4 C) (Oral)   Wt 211 lb 11.2 oz (96 kg)   SpO2 94%   BMI 25.77 kg/m  BP Readings from Last 3 Encounters:  04/15/24 (!) 146/68  03/09/24 133/66  02/25/24 118/66   Wt Readings from Last 3 Encounters:  04/15/24 211 lb 11.2 oz (96 kg)  03/09/24 211 lb 11.2 oz (96 kg)  02/28/24 208 lb (94.3 kg)      Physical Exam Vitals reviewed.  Constitutional:      General: He is not in acute distress.    Appearance: He is not ill-appearing.  Cardiovascular:     Comments: Regular rhythm with rate around 55 Pulmonary:     Effort: Pulmonary effort is normal.     Breath sounds: No rales.  Skin:    Comments: Left forearm reveals fairly large eschar which is healing well.  Near the distal end of eschar he has 1-1/2 x 1-1/2 cm firm nonfluctuant nodular area.  No surrounding erythema.  No warmth.  Neurological:     Mental Status: He is alert.      No results found for any visits on 04/15/24.    The ASCVD Risk score (Arnett DK, et al., 2019) failed to calculate for the following reasons:   The 2019 ASCVD risk score is only valid for ages 98 to 21    Assessment & Plan:   #1 hyperlipidemia.  History of peripheral vascular disease.  Goal LDL less than 70.  Patient currently off statin.  Requesting follow-up lipids and this will be drawn along with CMP.  #2 swollen lump left forearm following trauma little over a month ago.  Question foreign body reaction.  This came up very quickly about a week ago.  Does not have any fluctuance to suggest abscess.  Set up referral to his dermatologist to further assess.  May need excisional biopsy  #3 chronic piriformis syndrome.  Patient continues to struggle with musculoskeletal pain.  Continue exercises.  Handicap sticker completed  #4 health maintenance.  Strongly recommend flu vaccine and patient consents.  We also suggest to get RSV vaccine-and he will consider  No follow-ups on file.    Wolm Scarlet, MD

## 2024-04-16 MED ORDER — ROSUVASTATIN CALCIUM 20 MG PO TABS: 20.0000 mg | ORAL_TABLET | Freq: Every day | ORAL | 1 refills | Status: AC

## 2024-04-28 ENCOUNTER — Inpatient Hospital Stay: Payer: Medicare (Managed Care) | Attending: Hematology and Oncology

## 2024-04-28 ENCOUNTER — Inpatient Hospital Stay: Payer: Medicare (Managed Care) | Attending: Hematology and Oncology | Admitting: Hematology and Oncology

## 2024-04-28 VITALS — BP 138/77 | HR 63 | Temp 97.9°F | Resp 15 | Wt 214.2 lb

## 2024-04-28 DIAGNOSIS — Z9089 Acquired absence of other organs: Secondary | ICD-10-CM | POA: Diagnosis not present

## 2024-04-28 DIAGNOSIS — Z8041 Family history of malignant neoplasm of ovary: Secondary | ICD-10-CM | POA: Diagnosis not present

## 2024-04-28 DIAGNOSIS — R918 Other nonspecific abnormal finding of lung field: Secondary | ICD-10-CM | POA: Diagnosis present

## 2024-04-28 DIAGNOSIS — Z85828 Personal history of other malignant neoplasm of skin: Secondary | ICD-10-CM | POA: Diagnosis not present

## 2024-04-28 DIAGNOSIS — Z7901 Long term (current) use of anticoagulants: Secondary | ICD-10-CM | POA: Diagnosis not present

## 2024-04-28 DIAGNOSIS — Z881 Allergy status to other antibiotic agents status: Secondary | ICD-10-CM | POA: Insufficient documentation

## 2024-04-28 DIAGNOSIS — I739 Peripheral vascular disease, unspecified: Secondary | ICD-10-CM | POA: Insufficient documentation

## 2024-04-28 DIAGNOSIS — C321 Malignant neoplasm of supraglottis: Secondary | ICD-10-CM | POA: Diagnosis not present

## 2024-04-28 DIAGNOSIS — Z8679 Personal history of other diseases of the circulatory system: Secondary | ICD-10-CM | POA: Diagnosis not present

## 2024-04-28 DIAGNOSIS — I480 Paroxysmal atrial fibrillation: Secondary | ICD-10-CM | POA: Insufficient documentation

## 2024-04-28 DIAGNOSIS — I350 Nonrheumatic aortic (valve) stenosis: Secondary | ICD-10-CM | POA: Insufficient documentation

## 2024-04-28 DIAGNOSIS — C329 Malignant neoplasm of larynx, unspecified: Secondary | ICD-10-CM | POA: Insufficient documentation

## 2024-04-28 DIAGNOSIS — Z79899 Other long term (current) drug therapy: Secondary | ICD-10-CM | POA: Insufficient documentation

## 2024-04-28 DIAGNOSIS — Z803 Family history of malignant neoplasm of breast: Secondary | ICD-10-CM | POA: Diagnosis not present

## 2024-04-28 DIAGNOSIS — Z811 Family history of alcohol abuse and dependence: Secondary | ICD-10-CM | POA: Insufficient documentation

## 2024-04-28 DIAGNOSIS — D479 Neoplasm of uncertain behavior of lymphoid, hematopoietic and related tissue, unspecified: Secondary | ICD-10-CM

## 2024-04-28 DIAGNOSIS — Z6372 Alcoholism and drug addiction in family: Secondary | ICD-10-CM | POA: Insufficient documentation

## 2024-04-28 LAB — CBC WITH DIFFERENTIAL/PLATELET
Abs Immature Granulocytes: 0.02 K/uL (ref 0.00–0.07)
Basophils Absolute: 0 K/uL (ref 0.0–0.1)
Basophils Relative: 0 %
Eosinophils Absolute: 0.3 K/uL (ref 0.0–0.5)
Eosinophils Relative: 7 %
HCT: 43.9 % (ref 39.0–52.0)
Hemoglobin: 15.1 g/dL (ref 13.0–17.0)
Immature Granulocytes: 0 %
Lymphocytes Relative: 21 %
Lymphs Abs: 1 K/uL (ref 0.7–4.0)
MCH: 33.8 pg (ref 26.0–34.0)
MCHC: 34.4 g/dL (ref 30.0–36.0)
MCV: 98.2 fL (ref 80.0–100.0)
Monocytes Absolute: 0.6 K/uL (ref 0.1–1.0)
Monocytes Relative: 13 %
Neutro Abs: 2.8 K/uL (ref 1.7–7.7)
Neutrophils Relative %: 59 %
Platelets: 180 K/uL (ref 150–400)
RBC: 4.47 MIL/uL (ref 4.22–5.81)
RDW: 13.2 % (ref 11.5–15.5)
WBC: 4.7 K/uL (ref 4.0–10.5)
nRBC: 0 % (ref 0.0–0.2)

## 2024-04-28 NOTE — Progress Notes (Signed)
 Cory [name] Cory Agent, MD as PCP - Cardiology (Cardiology) Cory [name] ([name]) Cory [name], Cory [name], Cory [name] Cory Domino, MD as Consulting Physician (Radiation Oncology) Cory Ash, MD as Consulting Physician (Hematology and Oncology) Cory Clark, MD as Consulting Physician (Otolaryngology)  Cory [name]  ONCOLOGIC HISTORY: Initially presented to ENT with chronic left-sided sore throat for almost a year.  Initial evaluation did not show any possible causes for sore throat.  He was recommended PPI twice daily but went back to ENT with persistent sore throat he once again had a repeat fiberoptic exam which was stable.   CT neck done on November 08, 2021 did show subcentimeter focus of mucosal hyperenhancement along the posterior aspect of the uvula  laryngoscopy with direct visualization recommended to exclude a mucosal lesion at the site.  Additionally few tiny cystic-appearing foci along the left glossotonsillar sulcus.  Direct visualization recommended.  Nonspecific mildly enlarged right level 2 lymph node.  Multiple small calcific foci in the left palatine tonsil.Repeat fiberoptic exam was unremarkable hence Dr. 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  Tongue biopsy showed benign squamous mucosa with low to intermediate grade dysplasia. 05/07/2022: PET/CT showed asymmetric hypermetabolism in the left oropharynx and involving the left epiglottis.  Contralateral hypermetabolic level 2 cervical lymph nodes compatible with metastatic disease.  No hypermetabolic adenopathy in the left neck or in the chest.  1.2  cm short axis aortocaval lymph node in the upper abdomen is hypermetabolic metastatic disease is a concern.  This lymph node has increased from 0.7 cm on the study from 2022.  No other metastatic disease. Received radiation from 05/30/2022-07/18/2022 and weekly cisplatin  from 06/01/2022-07/18/2022.   INTERIM HISTORY:  Cory [name] returns for a follow up after completion of chemoradiation on 07/18/2022.   Discussed the use of AI scribe software for clinical note transcription with the patient, who gave verbal consent to proceed. History of Present Illness  Cory [name] is an 78 year old male with lung nodules and laryngeal cancer who presents for follow-up.  He has a history of lung nodules that are under surveillance. A CT chest was ordered but has not yet been scheduled. His last CT was in July, and he anticipated the next one to be scheduled for October 1st. No new symptoms such as cough, chest pain, or shortness of breath. No loss of appetite, weight loss, or night sweats.  He has a history of laryngeal cancer and last consulted with the ear, nose, and throat specialist in February. He was expecting a follow-up appointment every six months.  He has a questionable lymphoproliferative disorder, inconclusive biopsies, but monitoring.  Rest of the pertinent 10 point ROS reviewed and neg.  Wt Readings from Last 3 Encounters:  04/28/24 214 lb 3.2 oz (97.2 kg)  04/15/24 211 lb 11.2 oz (96 kg)  03/09/24 211 lb 11.2 oz (96 kg)    MEDICAL HISTORY:  Past Medical History:  Diagnosis Date   AAA (abdominal aortic aneurysm)    Aortic stenosis    a. Moderate by echo 05/2020.   Arthritis    right hip   Cancer (HCC)    skin cancer nose, right left, upper left basel cell  Dysrhythmia    a-fib   Heart murmur    Malignant neoplasm of [name] (HCC) 05/2022   PAF (paroxysmal atrial fibrillation) (HCC)    Peripheral arterial disease    Tobacco abuse     SURGICAL HISTORY: Past  Surgical History:  Procedure Laterality Date   ABDOMINAL AORTIC ENDOVASCULAR STENT GRAFT N/A 08/29/2020   Procedure: ABDOMINAL AORTIC ENDOVASCULAR STENT GRAFT;  Surgeon: Harvey Carlin BRAVO, MD;  Location: [facility] OR;  Service: Vascular;  Laterality: N/A;   ABDOMINAL AORTOGRAM W/LOWER EXTREMITY Bilateral 06/24/2020   Procedure: ABDOMINAL AORTOGRAM W/LOWER EXTREMITY;  Surgeon: Harvey Carlin BRAVO, MD;  Location: [facility];  Service: Cardiovascular;  Laterality: Bilateral;   BACK SURGERY     2005   BIOPSY  05/21/2022   Procedure: BIOPSY;  Surgeon: Wilhelmenia Aloha Raddle., MD;  Location: [facility];  Service: Gastroenterology;;   BRONCHIAL BIOPSY  02/17/2024   Procedure: BRONCHOSCOPY, WITH BIOPSY;  Surgeon: Shelah Lamar RAMAN, MD;  Location: [facility] ENDOSCOPY;  Service: Pulmonary;;   BRONCHIAL BRUSHINGS  02/17/2024   Procedure: BRONCHOSCOPY, WITH BRUSH BIOPSY;  Surgeon: Shelah Lamar RAMAN, MD;  Location: [facility];  Service: Pulmonary;;   BRONCHIAL NEEDLE ASPIRATION BIOPSY  02/17/2024   Procedure: BRONCHOSCOPY, WITH NEEDLE ASPIRATION BIOPSY;  Surgeon: Shelah Lamar RAMAN, MD;  Location: [facility];  Service: Pulmonary;;   BRONCHIAL WASHINGS  02/17/2024   Procedure: IRRIGATION, BRONCHUS;  Surgeon: Shelah Lamar RAMAN, MD;  Location: [facility];  Service: Pulmonary;;   CERVICAL SPINE SURGERY     DIRECT LARYNGOSCOPY Bilateral 04/18/2022   Procedure: DIRECT LARYNGOSCOPY WITH BIOPSIES;  Surgeon: Cory Clark, MD;  Location: [facility] OR;  Service: ENT;  Laterality: Bilateral;   EMBOLECTOMY Right 08/29/2020   Procedure: POPLITEAL AND TIBIAL EMBOLECTOMY;  Surgeon: Harvey Carlin BRAVO, MD;  Location[facility] OR;  Service: Vascular;  Laterality: Right;   EMBOLIZATION (CATH LAB) Right 06/24/2020   Procedure: EMBOLIZATION;  Surgeon: Harvey Carlin BRAVO, MD;  Location: [facility];  Service: Cardiovascular;  Laterality: Right;   ENDARTERECTOMY FEMORAL Right 08/29/2020   Procedure: ENDARTERECTOMY FEMORAL;  Surgeon: Harvey Carlin BRAVO, MD;  Location: [facility] OR;  Service: Vascular;  Laterality: Right;   ESOPHAGOGASTRODUODENOSCOPY N/A 06/27/2022   Procedure: ESOPHAGOGASTRODUODENOSCOPY (EGD);  Surgeon: Wilhelmenia Aloha Raddle., MD;  Location: [facility];  Service: Gastroenterology;  Laterality: N/A;   ESOPHAGOGASTRODUODENOSCOPY (EGD) WITH PROPOFOL  N/A 05/21/2022   Procedure: ESOPHAGOGASTRODUODENOSCOPY (EGD) WITH PROPOFOL ;  Surgeon: Wilhelmenia Aloha Raddle., MD;  Location: [facility] END[facility]COPY;  Service: Gastroenterology;  Laterality: N/A;   EUS N/A 05/21/2022   Procedure: UPPER ENDOSCOPIC ULTRASOUND (EUS) RADIAL;  Surgeon: Wilhelmenia Aloha Raddle., MD;  Location: [facility];  Service: Gastroenterology;  Laterality: N/A;   EUS N/A 06/27/2022   Procedure: UPPER ENDOSCOPIC ULTRASOUND (EUS) RADIAL;  Surgeon: Wilhelmenia Aloha Raddle., MD;  Location: [facility];  Service: Gastroenterology;  Laterality: N/A;   EYE SURGERY     FEMORAL ARTERY EXPLORATION Right 06/25/2020   Procedure: RIGHT GROIN EXPLORATION Evacuation of Hematoma  WITH REPAIR OF RIGHT Common FEMORAL ARTERY.;  Surgeon: Eliza Lonni RAMAN, MD;  Location: [facility] OR;  Service: Vascular;  Laterality: Right;   FEMORAL-POPLITEAL BYPASS GRAFT Left 11/22/2020   Procedure: LEFT ABOVE KNEE-BELOW KNEE BYPASS TIBIAL PERITONEAL TRUNK REVERSE IPSILATERAL GREATER SAPHENOUS VEIN;  Surgeon: Harvey Carlin BRAVO, MD;  Location: [facility] OR;  Service: Vascular;  Laterality: Left;   FINE NEEDLE ASPIRATION N/A 05/21/2022   Procedure: FINE NEEDLE ASPIRATION (FNA) LINEAR;  Surgeon: Wilhelmenia Aloha Raddle., MD;  Location: [facility];  Service: Gastroenterology;  Laterality: N/A;   FINE NEEDLE ASPIRATION  06/27/2022   Procedure: FINE NEEDLE ASPIRATION;  Surgeon: Wilhelmenia Aloha Raddle., MD;  Location: [facility];  Service: Gastroenterology;;   HOT HEMOSTASIS N/A 06/27/2022   Procedure: HOT HEMOSTASIS (ARGON PLASMA COAGULATION/BICAP);  Surgeon: Wilhelmenia Aloha Raddle., MD;  Location: [facility];  Service:  Gastroenterology;  Laterality: N/A;   IR GASTROSTOMY TUBE MOD SED  06/12/2022   IR GASTROSTOMY TUBE REMOVAL  09/04/2022   IR IMAGING GUIDED PORT INSERTION  06/12/2022   IR REMOVAL TUN ACCESS W/ PORT W/O FL MOD SED  11/21/2022   LEG SURGERY     Trauma   LOWER EXTREMITY ANGIOGRAPHY Left 11/17/2020   Procedure: Lower Extremity Angiography;  Surgeon: Gretta Lonni PARAS, MD;  Location: [facility] INVASIVE CV LAB;  Service: Cardiovascular;  Laterality: Left;   PATCH ANGIOPLASTY Right 08/29/2020   Procedure: PATCH ANGIOPLASTY RIGHT BELOW KNEE POPLITEAL ARTERY AND RIGHT COMMON FEMORAL ARTERY;  Surgeon: Harvey Carlin BRAVO, MD;  Location: [facility] OR;  Service: Vascular;  Laterality: Right;   RETINAL DETACHMENT SURGERY     RIGID ESOPHAGOSCOPY  04/18/2022   Procedure: RIGID ESOPHAGOSCOPY;  Surgeon: Cory Clark, MD;  Location: [facility] OR;  Service: ENT;;   TONSILLECTOMY  04/18/2022   Procedure: LEFT TONSILLECTOMY;  Surgeon: Cory Clark, MD;  Location: [facility] OR;  Service: ENT;;   ULTRASOUND GUIDANCE FOR VASCULAR ACCESS Bilateral 08/29/2020   Procedure: ULTRASOUND GUIDANCE FOR VASCULAR ACCESS;  Surgeon: Harvey Carlin BRAVO, MD;  Location: [facility] OR;  Service: Vascular;  Laterality: Bilateral;   VIDEO BRONCHOSCOPY WITH ENDOBRONCHIAL NAVIGATION Bilateral 02/17/2024   Procedure: VIDEO BRONCHOSCOPY WITH ENDOBRONCHIAL NAVIGATION;  Surgeon: Shelah Lamar RAMAN, MD;  Location: [facility];  Service: Pulmonary;  Laterality: Bilateral;    SOCIAL HISTORY: Social History   Socioeconomic History   Marital status: Married    Spouse name: Not on file   Number of children: 5   Years of education: Not on file   Highest education level: Master's degree (e.g., MA, MS, MEng, MEd, MSW, MBA)  Occupational History   Not on file  Tobacco Use   Smoking status: Former    Current packs/day: 0.50    Average packs/day: 0.5 packs/day for 50.0 years (25.0 ttl pk-yrs)    Types: Cigarettes    Passive exposure: Never   Smokeless tobacco: Never   Tobacco  comments:    Quit smoking few months ago 01/23/2024    Smoked 1 PPD x 60 years     60 pack year smoking history.  Vaping Use   Vaping status: Never Used  Substance and Sexual Activity   Alcohol use: Yes    Alcohol/week: 0.0 standard drinks of alcohol    Comment: 4 to 5 times a week; beer, wine and liquor    Drug use: No   Sexual activity: Not Currently  Other Topics Concern   Not on file  Social History Narrative   Scores reading tests.  Lives with wife.     Social Drivers of Corporate investment banker Strain: Low Risk  (02/28/2024)   Overall Financial Resource Strain (CARDIA)    Difficulty of Paying Living Expenses: Not hard at all  Food Insecurity: No Food Insecurity (02/28/2024)   Hunger Vital Sign    Worried About Running Out of Food in the Last Year: Never true    Ran Out of Food in the Last Year: Never true  Transportation Needs: No Transportation Needs (02/28/2024)   PRAPARE - Administrator, Civil Service (Medical):  No    Lack of Transportation (Non-Medical): No  Physical Activity: Insufficiently Active (02/28/2024)   Exercise Vital Sign    Days of Exercise per Week: 4 days    Minutes of Exercise per Session: 30 min  Stress: No Stress Concern Present (02/28/2024)   [employer] of Occupational Health - Occupational Stress Questionnaire    Feeling of Stress: Not at all  Social Connections: Moderately Isolated (02/28/2024)   Social Connection and Isolation Panel    Frequency of Communication with Friends and Family: More than three times a week    Frequency of Social Gatherings with Friends and Family: More than three times a week    Attends Religious Services: Never    Database administrator or Organizations: No    Attends Engineer, structural: Not on file    Marital Status: Married  Catering manager Violence: Not At Risk (02/28/2024)   Humiliation, Afraid, Rape, and Kick questionnaire    Fear of Current or Ex-Partner: No    Emotionally Abused:  No    Physically Abused: No    Sexually Abused: No    FAMILY HISTORY: Family History  Problem Relation Age of Onset   Ovarian cancer Mother    Alcoholism Father    Breast cancer Sister    Ovarian cancer Sister     ALLERGIES:  is allergic to zithromax  [azithromycin ] and chlorpheniramine.  MEDICATIONS:  Current Outpatient Medications  Medication Sig Dispense Refill   acetaminophen  (TYLENOL ) 500 MG tablet Take 500 mg by mouth every 6 (six) hours as needed (for pain.).     cetirizine  (ZYRTEC ) 10 MG tablet Take 1 tablet (10 mg total) by mouth daily. 30 tablet 11   cholecalciferol  (VITAMIN D ) 25 MCG (1000 UNIT) tablet Take 1,000 Units by mouth 2 (two) times daily.     diltiazem  (TIADYLT  ER) 120 MG 24 hr capsule Take 1 capsule (120 mg total) by mouth daily. 90 capsule 1   Fluticasone -Umeclidin-Vilant (TRELEGY ELLIPTA ) 100-62.5-25 MCG/ACT AEPB Inhale 1 puff into the lungs daily. 60 each 0   metoprolol  tartrate (LOPRESSOR ) 25 MG tablet Take 1 tablet by mouth twice daily as needed 180 tablet 1   rivaroxaban  (XARELTO ) 20 MG TABS tablet Take 1 tablet (20 mg total) by mouth daily with supper. 90 tablet 1   rosuvastatin  (CRESTOR ) 20 MG tablet Take 1 tablet (20 mg total) by mouth daily. 90 tablet 1   sodium chloride  (OCEAN) 0.65 % SOLN nasal spray Place 1 spray into both nostrils as needed for congestion.     budesonide-glycopyrrolate -formoterol (BREZTRI  AEROSPHERE) 160-9-4.8 MCG/ACT AERO inhaler Inhale 2 puffs into the lungs in the morning and at bedtime. (Patient not taking: Reported on 04/28/2024) 1 each 6   fluticasone  (FLONASE ) 50 MCG/ACT nasal spray Place 2 sprays into both nostrils daily. (Patient not taking: Reported on 04/28/2024) 16 g 6   lidocaine -prilocaine  (EMLA ) cream Apply to affected area once (Patient not taking: Reported on 04/28/2024) 30 g 3   nicotine  (NICODERM CQ  - DOSED IN MG/24 HOURS) 21 mg/24hr patch Place 1 patch (21 mg total) onto the skin daily. Apply 21 mg patch daily x 6 wk,  then 14mg  patch daily x 2 wk, then 7 mg patch daily x 2 wk (Patient not taking: Reported on 04/28/2024) 14 patch 2   omeprazole  (PRILOSEC) 40 MG capsule Take 1 capsule (40 mg total) by mouth daily. (Patient not taking: Reported on 04/28/2024) 30 capsule 6   No current facility-administered medications for this visit.  PHYSICAL EXAMINATION: ECOG PERFORMANCE STATUS: 1 - Symptomatic but completely ambulatory  Vitals:   04/28/24 1118  BP: 138/77  Pulse: 63  Resp: 15  Temp: 97.9 F (36.6 C)  SpO2: 98%    Filed Weights   04/28/24 1118  Weight: 214 lb 3.2 oz (97.2 kg)     Physical Exam Constitutional:      Appearance: Normal appearance.  Cardiovascular:     Rate and Rhythm: Normal rate and regular rhythm.  Pulmonary:     Effort: Pulmonary effort is normal.     Breath sounds: Normal breath sounds.  Abdominal:     Comments:    Musculoskeletal:        [name]: No swelling or tenderness. Normal range of motion.     Cervical back: Normal range of motion and neck supple. No rigidity.  Lymphadenopathy:     Cervical: No cervical adenopathy.  Skin:    [name]: Skin is warm and dry.  Neurological:     [name]: No focal deficit present.     Mental Status: He is alert.      LABORATORY DATA:  I have reviewed the data as listed Lab Results  Component Value Date   WBC 4.7 04/28/2024   HGB 15.1 04/28/2024   HCT 43.9 04/28/2024   MCV 98.2 04/28/2024   PLT 180 04/28/2024     Chemistry      Component Value Date/Time   NA 136 04/15/2024 0903   NA 141 05/16/2018 1425   K 4.5 04/15/2024 0903   CL 102 04/15/2024 0903   CO2 28 04/15/2024 0903   BUN 17 04/15/2024 0903   BUN 13 05/16/2018 1425   CREATININE 0.95 04/15/2024 0903   CREATININE 0.91 09/17/2023 1332      Component Value Date/Time   CALCIUM  9.5 04/15/2024 0903   ALKPHOS 35 (L) 04/15/2024 0903   AST 14 04/15/2024 0903   AST 14 (L) 09/17/2023 1332   ALT 15 04/15/2024 0903   ALT 14 09/17/2023 1332   BILITOT 0.6  04/15/2024 0903   BILITOT 0.4 09/17/2023 1332       RADIOGRAPHIC STUDIES: I have personally reviewed the radiological images as listed and agreed with the findings in the report. No results found.   ASSESSMENT AND PLAN: Assessment and Plan Assessment & Plan  Lymphoproliferative disorder Multiple biopsies couldn't establish the exact type Most recent labs with no concerns. We will follow up CBC, CMP and LDH every 3/4 months  Pulmonary nodules under surveillance Pulmonary nodules monitored, no new symptoms reported. - Contact Sarah Groce's office to schedule CT chest. - Call him with CT results once available.  Laryngeal cancer under surveillance Laryngeal cancer stable, last laryngoscopy showed good results. ENT follow-up due. - He to call ENT for follow-up appointment.   I have spent a total of 30 minutes minutes of face-to-face and non-face-to-face time, preparing to see the patient, performing a medically appropriate examination, counseling and educating the patient, documenting clinical information in the electronic health record, and care coordination.   Amber Stalls MD

## 2024-04-29 ENCOUNTER — Other Ambulatory Visit (HOSPITAL_COMMUNITY): Payer: Self-pay

## 2024-04-29 ENCOUNTER — Encounter: Payer: Self-pay | Admitting: Emergency Medicine

## 2024-04-29 ENCOUNTER — Other Ambulatory Visit: Payer: Self-pay | Admitting: Acute Care

## 2024-04-29 ENCOUNTER — Ambulatory Visit: Payer: Medicare (Managed Care) | Admitting: Emergency Medicine

## 2024-04-29 VITALS — BP 128/80 | HR 60 | Ht 76.0 in | Wt 215.2 lb

## 2024-04-29 DIAGNOSIS — J301 Allergic rhinitis due to pollen: Secondary | ICD-10-CM

## 2024-04-29 DIAGNOSIS — J438 Other emphysema: Secondary | ICD-10-CM

## 2024-04-29 DIAGNOSIS — R918 Other nonspecific abnormal finding of lung field: Secondary | ICD-10-CM | POA: Diagnosis not present

## 2024-04-29 DIAGNOSIS — J449 Chronic obstructive pulmonary disease, unspecified: Secondary | ICD-10-CM

## 2024-04-29 MED ORDER — MONTELUKAST SODIUM 10 MG PO TABS
10.0000 mg | ORAL_TABLET | Freq: Every day | ORAL | 11 refills | Status: AC
  Filled 2024-04-29: qty 30, 30d supply, fill #0

## 2024-04-29 NOTE — Progress Notes (Signed)
 Subjective:    Patient ID: Cory [name], male    DOB: February 02, 1946, 78 y.o.   MRN: [id number]  COPD His past medical history is significant for COPD.   Cory [name] is a 78 year old male with a history of malignant neoplasm of the supraglottis who presents for evaluation of multiple pulmonary nodules.  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 He underwent radiation therapy and cisplatin  treatment.  Surveillance imaging has shown multiple small pulmonary nodules. A CT scan of the chest performed on Jan 03, 2024, revealed moderate central lobe or paraseptal emphysema, predominantly in the upper lobes, and numerous scattered pulmonary nodules numbering over 20. The dominant nodules include 5 and 7 mm nodules in the left upper lobe and 4 and 6 mm nodules in the right upper lobe.  He has intermittent cough, nonproductive, no dyspnea.  He has significant allergies and congestion, rhinitis.   ROV 04/29/2024.  Marcey is 14 and follows up today.  He has a history of supraglottic squamous cell cancer 2023 treated with cisplatin  and radiation.  I saw him for multiple bilateral pulmonary nodules prompting a navigational bronchoscopy 02/17/2024 (targeted left upper lobe nodule, right upper lobe nodule).  His cytology was negative, showed fibroelastic stroma and pulmonary macrophages. His repeat CT scan of chest is planned for October.  At his last visit he was started empirically on Breztri  to see if he would get a benefit. He tried the Breztri  but then insurance would not cover. He was changed to trelegy, is unsure whether it has helped him any. He has chronic nasal congestion, on zyrtec  and flonase . He uses nasal saline sometimes. Does not use albuterol   Pulmonary function testing 03/27/2024  reviewed by me showed severe obstruction without a bronchodilator response, hyperinflated lung volumes, decreased diffusion capacity.   Review of Systems As per HPI  Past Medical History:  Diagnosis Date   AAA (abdominal aortic aneurysm)    Aortic stenosis    a. Moderate by echo 05/2020.   Arthritis    right hip   Cancer (HCC)    skin cancer nose, right left, upper left basel cell   Dysrhythmia    a-fib   Heart murmur    Malignant neoplasm of supraglottis (HCC) 05/2022   PAF (paroxysmal atrial fibrillation) (HCC)    Peripheral arterial disease    Tobacco abuse      Family History  Problem Relation Age of Onset   Ovarian cancer Mother    Alcoholism Father    Breast cancer Sister    Ovarian cancer Sister      Social History   Socioeconomic History   Marital status: Married    Spouse name: Not on file   Number of children: 5   Years of education: Not on file   Highest education level: Master's degree (e.g., MA, MS, MEng, MEd, MSW, MBA)  Occupational History   Not on file  Tobacco Use   Smoking status: Former    Current packs/day: 0.50    Average packs/day: 0.5 packs/day for 50.0 years (25.0 ttl pk-yrs)    Types: Cigarettes    Passive exposure: Never   Smokeless tobacco: Never   Tobacco comments:    Quit smoking few months ago 01/23/2024    Smoked 1 PPD x 60 years  60 pack year smoking history.  Vaping Use   Vaping status: Never Used  Substance and Sexual Activity   Alcohol use: Yes    Alcohol/week: 0.0 standard drinks of alcohol    Comment: 4 to 5 times a week; beer, wine and liquor    Drug use: No   Sexual activity: Not Currently  Other Topics Concern   Not on file  Social History Narrative   Scores reading tests.  Lives with wife.     Social Drivers of Corporate investment banker Strain: Low Risk  (02/28/2024)   Overall Financial Resource Strain (CARDIA)    Difficulty of Paying Living Expenses: Not hard at all  Food Insecurity: No Food Insecurity  (02/28/2024)   Hunger Vital Sign    Worried About Running Out of Food in the Last Year: Never true    Ran Out of Food in the Last Year: Never true  Transportation Needs: No Transportation Needs (02/28/2024)   PRAPARE - Administrator, Civil Service (Medical): No    Lack of Transportation (Non-Medical): No  Physical Activity: Insufficiently Active (02/28/2024)   Exercise Vital Sign    Days of Exercise per Week: 4 days    Minutes of Exercise per Session: 30 min  Stress: No Stress Concern Present (02/28/2024)   [employer] of Occupational Health - Occupational Stress Questionnaire    Feeling of Stress: Not at all  Social Connections: Moderately Isolated (02/28/2024)   Social Connection and Isolation Panel    Frequency of Communication with Friends and Family: More than three times a week    Frequency of Social Gatherings with Friends and Family: More than three times a week    Attends Religious Services: Never    Database administrator or Organizations: No    Attends Engineer, structural: Not on file    Marital Status: Married  Catering manager Violence: Not At Risk (02/28/2024)   Humiliation, Afraid, Rape, and Kick questionnaire    Fear of Current or Ex-Partner: No    Emotionally Abused: No    Physically Abused: No    Sexually Abused: No     Allergies  Allergen Reactions   Zithromax  [Azithromycin ] Diarrhea   Chlorpheniramine Cough and Itching     Outpatient Medications Prior to Visit  Medication Sig Dispense Refill   acetaminophen  (TYLENOL ) 500 MG tablet Take 500 mg by mouth every 6 (six) hours as needed (for pain.).     cetirizine  (ZYRTEC ) 10 MG tablet Take 1 tablet (10 mg total) by mouth daily. 30 tablet 11   cholecalciferol  (VITAMIN D ) 25 MCG (1000 UNIT) tablet Take 1,000 Units by mouth 2 (two) times daily.     diltiazem  (TIADYLT  ER) 120 MG 24 hr capsule Take 1 capsule (120 mg total) by mouth daily. 90 capsule 1   Fluticasone -Umeclidin-Vilant (TRELEGY  ELLIPTA) 100-62.5-25 MCG/ACT AEPB Inhale 1 puff into the lungs daily. 60 each 0   metoprolol  tartrate (LOPRESSOR ) 25 MG tablet Take 1 tablet by mouth twice daily as needed 180 tablet 1   rivaroxaban  (XARELTO ) 20 MG TABS tablet Take 1 tablet (20 mg total) by mouth daily with supper. 90 tablet 1   rosuvastatin  (CRESTOR ) 20 MG tablet Take 1 tablet (20 mg total) by mouth daily. 90 tablet 1   sodium chloride  (OCEAN) 0.65 % SOLN nasal [name] Place 1 [name] into both nostrils as needed for congestion.     budesonide-glycopyrrolate -formoterol (BREZTRI  AEROSPHERE) 160-9-4.8 MCG/ACT AERO inhaler Inhale 2 puffs into the  lungs in the morning and at bedtime. (Patient not taking: Reported on 04/29/2024) 1 each 6   fluticasone  (FLONASE ) 50 MCG/ACT nasal [name] Place 2 sprays into both nostrils daily. (Patient not taking: Reported on 04/29/2024) 16 g 6   lidocaine -prilocaine  (EMLA ) cream Apply to affected area once (Patient not taking: Reported on 04/29/2024) 30 g 3   nicotine  (NICODERM CQ  - DOSED IN MG/24 HOURS) 21 mg/24hr patch Place 1 patch (21 mg total) onto the skin daily. Apply 21 mg patch daily x 6 wk, then 14mg  patch daily x 2 wk, then 7 mg patch daily x 2 wk (Patient not taking: Reported on 04/29/2024) 14 patch 2   omeprazole  (PRILOSEC) 40 MG capsule Take 1 capsule (40 mg total) by mouth daily. (Patient not taking: Reported on 04/29/2024) 30 capsule 6   No facility-administered medications prior to visit.         Objective:   Physical Exam Vitals:   04/29/24 1127  BP: 128/80  Pulse: 60  SpO2: 94%  Weight: 215 lb 3.2 oz (97.6 kg)  Height: 6' 4 (1.93 m)    Gen: Pleasant, well-nourished, in no distress,  normal affect  ENT: No lesions,  mouth clear,  oropharynx clear, no postnasal drip  Neck: No JVD, no stridor  Lungs: No use of accessory muscles, no crackles or wheezing on normal respiration, no wheeze on forced expiration  Cardiovascular: RRR, heart sounds normal, no murmur or gallops, no  peripheral edema  Musculoskeletal: No deformities, no cyanosis or clubbing  Neuro: alert, awake, non focal  Skin: Warm, no lesions or rash      Assessment & Plan:   Pulmonary nodules Cytology negative by bronchoscopy on 02/17/2024.  Reassuring but still needs close follow-up.  His next scan is ordered for October and we will follow this with him when available  Paraseptal emphysema [facility]) Please continue Trelegy once daily and keep track of how it helps your breathing so we can decide whether to continue it.  Rinse and gargle after using.  Allergic rhinitis Continue your Zyrtec , Flonase  nasal [name] and nasal saline rinses as you have been doing them. Will send a prescription to your pharmacy for Singulair .  Take 10 mg each evening.    Lamar Chris, MD, PhD 04/29/2024, 4:56 PM [facility] [phone] or if no answer before 7:00PM call [phone] For any issues after 7:00PM please call eLink [phone]

## 2024-04-29 NOTE — Assessment & Plan Note (Signed)
 Cytology negative by bronchoscopy on 02/17/2024.  Reassuring but still needs close follow-up.  His next scan is ordered for October and we will follow this with him when available

## 2024-04-29 NOTE — Assessment & Plan Note (Signed)
 Continue your Zyrtec , Flonase  nasal spray and nasal saline rinses as you have been doing them. Will send a prescription to your pharmacy for Singulair .  Take 10 mg each evening.

## 2024-04-29 NOTE — Assessment & Plan Note (Signed)
 Please continue Trelegy once daily and keep track of how it helps your breathing so we can decide whether to continue it.  Rinse and gargle after using.

## 2024-04-29 NOTE — Patient Instructions (Addendum)
 We will repeat your CT scan of the chest in October to compare with priors. Please continue Trelegy once daily and keep track of how it helps your breathing so we can decide whether to continue it.  Rinse and gargle after using. Continue your Zyrtec , Flonase  nasal spray and nasal saline rinses as you have been doing them. Will send a prescription to your pharmacy for Singulair .  Take 10 mg each evening. Follow-up in our office with either Dr. Shelah or S. Ruthell, NP after your CT chest so we can review those results together.

## 2024-04-30 ENCOUNTER — Encounter (HOSPITAL_COMMUNITY)
Admission: RE | Admit: 2024-04-30 | Discharge: 2024-04-30 | Disposition: A | Discharge status: Home / Self Care | Payer: Medicare (Managed Care) | Source: Ambulatory Visit | Attending: Acute Care | Admitting: Acute Care

## 2024-04-30 ENCOUNTER — Other Ambulatory Visit (HOSPITAL_COMMUNITY): Payer: Self-pay

## 2024-04-30 ENCOUNTER — Encounter: Payer: Self-pay | Admitting: Hematology and Oncology

## 2024-04-30 ENCOUNTER — Other Ambulatory Visit: Payer: Self-pay

## 2024-04-30 DIAGNOSIS — Z8521 Personal history of malignant neoplasm of larynx: Secondary | ICD-10-CM | POA: Diagnosis not present

## 2024-04-30 DIAGNOSIS — R918 Other nonspecific abnormal finding of lung field: Secondary | ICD-10-CM | POA: Diagnosis not present

## 2024-04-30 MED ORDER — IOHEXOL 300 MG/ML  SOLN
75.0000 mL | Freq: Once | INTRAMUSCULAR | Status: AC | PRN
  Administered 2024-04-30: 75 mL via INTRAVENOUS

## 2024-04-30 MED ORDER — TRELEGY ELLIPTA 100-62.5-25 MCG/ACT IN AEPB
1.0000 | INHALATION_SPRAY | Freq: Every day | RESPIRATORY_TRACT | 5 refills | Status: AC
  Filled 2024-04-30 (×2): qty 60, 30d supply, fill #0
  Filled 2024-06-09: qty 60, 30d supply, fill #1
  Filled 2024-07-11: qty 60, 30d supply, fill #2
  Filled 2024-08-11: qty 60, 30d supply, fill #3

## 2024-05-04 ENCOUNTER — Other Ambulatory Visit (HOSPITAL_COMMUNITY): Payer: Self-pay

## 2024-05-04 DIAGNOSIS — D485 Neoplasm of uncertain behavior of skin: Secondary | ICD-10-CM | POA: Diagnosis not present

## 2024-05-04 DIAGNOSIS — L821 Other seborrheic keratosis: Secondary | ICD-10-CM | POA: Diagnosis not present

## 2024-05-04 DIAGNOSIS — C44629 Squamous cell carcinoma of skin of left upper limb, including shoulder: Secondary | ICD-10-CM | POA: Diagnosis not present

## 2024-05-13 ENCOUNTER — Inpatient Hospital Stay: Payer: Medicare (Managed Care) | Attending: Hematology and Oncology | Admitting: Hematology and Oncology

## 2024-05-13 ENCOUNTER — Other Ambulatory Visit: Payer: Self-pay | Admitting: Hematology and Oncology

## 2024-05-13 DIAGNOSIS — D479 Neoplasm of uncertain behavior of lymphoid, hematopoietic and related tissue, unspecified: Secondary | ICD-10-CM | POA: Diagnosis not present

## 2024-05-13 DIAGNOSIS — R918 Other nonspecific abnormal finding of lung field: Secondary | ICD-10-CM

## 2024-05-13 DIAGNOSIS — C321 Malignant neoplasm of supraglottis: Secondary | ICD-10-CM | POA: Diagnosis not present

## 2024-05-13 NOTE — Progress Notes (Signed)
**Note Cory-Identified via Obfuscation**  [facility] CONSULT NOTE  Patient Care Team: Micheal Wolm ORN, MD as PCP - General [name], MD as PCP - Cardiology (Cardiology) [name], [facility] (Inactive) as Pharmacist (Pharmacist) Malmfelt, Delon CROME, RN as Oncology Nurse Navigator Izell Domino, MD as Consulting Physician (Radiation Oncology) Loretha Ash, MD as Consulting Physician (Hematology and Oncology) Carlie Clark, MD as Consulting Physician (Otolaryngology)  CHIEF COMPLAINTS/PURPOSE OF CONSULTATION:  SCC supraglottis  ONCOLOGIC HISTORY: Initially presented to ENT with chronic left-sided sore throat for almost a year.  Initial evaluation did not show any possible causes for sore throat.  He was recommended PPI twice daily but went back to ENT with persistent sore throat he once again had a repeat fiberoptic exam which was stable.   CT neck done on November 08, 2021 did show subcentimeter focus of mucosal hyperenhancement along the posterior aspect of the uvula  laryngoscopy with direct visualization recommended to exclude a mucosal lesion at the site.  Additionally few tiny cystic-appearing foci along the left glossotonsillar sulcus.  Direct visualization recommended.  Nonspecific mildly enlarged right level 2 lymph node.  Multiple small calcific foci in the left palatine tonsil.Repeat fiberoptic exam was unremarkable hence Dr. 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  Tongue biopsy showed benign squamous mucosa with low to intermediate grade dysplasia. 05/07/2022: PET/CT showed asymmetric hypermetabolism in the left oropharynx and involving the left epiglottis.  Contralateral hypermetabolic level 2 cervical lymph nodes compatible with metastatic disease.  No hypermetabolic adenopathy in the left neck or in the chest.  1.2  cm short axis aortocaval lymph node in the upper abdomen is hypermetabolic metastatic disease is a concern.  This lymph node has increased from 0.7 cm on the study from 2022.  No other metastatic disease. Received radiation from 05/30/2022-07/18/2022 and weekly cisplatin  from 06/01/2022-07/18/2022.   INTERIM HISTORY:  Cory [name] returns for a follow up after completion of chemoradiation on 07/18/2022.   Discussed the use of AI scribe software for clinical note transcription with the patient, who gave verbal consent to proceed. History of Present Illness  Cory [name] is an 78 year old male with lung nodules and laryngeal cancer who presents for telephone follow up. He also has an underlying lymphoproliferative disorder which is not well characterized. He is here to review his CT results.  Rest of the pertinent 10 point ROS reviewed and neg.  Wt Readings from Last 3 Encounters:  04/29/24 215 lb 3.2 oz (97.6 kg)  04/28/24 214 lb 3.2 oz (97.2 kg)  04/15/24 211 lb 11.2 oz (96 kg)    MEDICAL HISTORY:  Past Medical History:  Diagnosis Date   AAA (abdominal aortic aneurysm)    Aortic stenosis    a. Moderate by echo 05/2020.   Arthritis    right hip   Cancer (HCC)    skin cancer nose, right left, upper left basel cell   Dysrhythmia    a-fib   Heart murmur    Malignant neoplasm of supraglottis (HCC) 05/2022   PAF (paroxysmal atrial fibrillation) (HCC)    Peripheral arterial disease    Tobacco abuse     SURGICAL HISTORY: Past Surgical History:  Procedure Laterality Date   ABDOMINAL AORTIC ENDOVASCULAR STENT GRAFT N/A 08/29/2020   Procedure: ABDOMINAL AORTIC ENDOVASCULAR STENT GRAFT;  Surgeon: Harvey Carlin BRAVO, MD;  Location: [facility] OR;  Service: Vascular;  Laterality: N/A;   ABDOMINAL AORTOGRAM W/LOWER EXTREMITY Bilateral 06/24/2020   Procedure: ABDOMINAL AORTOGRAM W/LOWER EXTREMITY;  Surgeon: Harvey Carlin BRAVO, MD;  Location: [facility] INVASIVE CV LAB;  Service: Cardiovascular;   Laterality: Bilateral;   BACK SURGERY     2005   BIOPSY  05/21/2022   Procedure: BIOPSY;  Surgeon: Wilhelmenia Aloha Raddle., MD;  Location: [facility];  Service: Gastroenterology;;   BRONCHIAL BIOPSY  02/17/2024   Procedure: BRONCHOSCOPY, WITH BIOPSY;  Surgeon: Shelah Lamar RAMAN, MD;  Location: [facility] ENDOSCOPY;  Service: Pulmonary;;   BRONCHIAL BRUSHINGS  02/17/2024   Procedure: BRONCHOSCOPY, WITH BRUSH BIOPSY;  Surgeon: Shelah Lamar RAMAN, MD;  Location: [facility] ENDOSCOPY;  Service: Pulmonary;;   BRONCHIAL NEEDLE ASPIRATION BIOPSY  02/17/2024   Procedure: BRONCHOSCOPY, WITH NEEDLE ASPIRATION BIOPSY;  Surgeon: Shelah Lamar RAMAN, MD;  Location: [facility] ENDOSCOPY;  Service: Pulmonary;;   BRONCHIAL WASHINGS  02/17/2024   Procedure: IRRIGATION, BRONCHUS;  Surgeon: Shelah Lamar RAMAN, MD;  Location: [facility] ENDOSCOPY;  Service: Pulmonary;;   CERVICAL SPINE SURGERY     DIRECT LARYNGOSCOPY Bilateral 04/18/2022   Procedure: DIRECT LARYNGOSCOPY WITH BIOPSIES;  Surgeon: Carlie Clark, MD;  Location: [facility] OR;  Service: ENT;  Laterality: Bilateral;   EMBOLECTOMY Right 08/29/2020   Procedure: POPLITEAL AND TIBIAL EMBOLECTOMY;  Surgeon: Harvey Carlin BRAVO, MD;  Location: [facility] OR;  Service: Vascular;  Laterality: Right;   EMBOLIZATION (CATH LAB) Right 06/24/2020   Procedure: EMBOLIZATION;  Surgeon: Harvey Carlin BRAVO, MD;  Location: [facility] INVASIVE CV LAB;  Service: Cardiovascular;  Laterality: Right;   ENDARTERECTOMY FEMORAL Right 08/29/2020   Procedure: ENDARTERECTOMY FEMORAL;  Surgeon: Harvey Carlin BRAVO, MD;  Location: [facility] OR;  Service: Vascular;  Laterality: Right;   ESOPHAGOGASTRODUODENOSCOPY N/A 06/27/2022   Procedure: ESOPHAGOGASTRODUODENOSCOPY (EGD);  Surgeon: Wilhelmenia Aloha Raddle., MD;  Location: [facility];  Service: Gastroenterology;  Laterality: N/A;   ESOPHAGOGASTRODUODENOSCOPY (EGD) WITH PROPOFOL  N/A 05/21/2022   Procedure: ESOPHAGOGASTRODUODENOSCOPY (EGD) WITH PROPOFOL ;  Surgeon: Wilhelmenia Aloha Raddle., MD;  Location: [facility]  ENDOSCOPY;  Service: Gastroenterology;  Laterality: N/A;   EUS N/A 05/21/2022   Procedure: UPPER ENDOSCOPIC ULTRASOUND (EUS) RADIAL;  Surgeon: Wilhelmenia Aloha Raddle., MD;  Location: [facility] ENDOSCOPY;  Service: Gastroenterology;  Laterality: N/A;   EUS N/A 06/27/2022   Procedure: UPPER ENDOSCOPIC ULTRASOUND (EUS) RADIAL;  Surgeon: Wilhelmenia Aloha Raddle., MD;  Location: [facility]COPY;  Service: Gastroenterology;  Laterality: N/A;   EYE SURGERY     FEMORAL ARTERY EXPLORATION Right 06/25/2020   Procedure: RIGHT GROIN EXPLORATION Evacuation of Hematoma  WITH REPAIR OF RIGHT Common FEMORAL ARTERY.;  Surgeon: Eliza Lonni RAMAN, MD;  Location: [facility] OR;  Service: Vascular;  Laterality: Right;   FEMORAL-POPLITEAL BYPASS GRAFT Left 11/22/2020   Procedure: LEFT ABOVE KNEE-BELOW KNEE BYPASS TIBIAL PERITONEAL TRUNK REVERSE IPSILATERAL GREATER SAPHENOUS VEIN;  Surgeon: Harvey Carlin BRAVO, MD;  Location: [facility] OR;  Service: Vascular;  Laterality: Left;   FINE NEEDLE ASPIRATION N/A 05/21/2022   Procedure: FINE NEEDLE ASPIRATION (FNA) LINEAR;  Surgeon: Wilhelmenia Aloha Raddle., MD;  Location: [facility];  Service: Gastroenterology;  Laterality: N/A;   FINE NEEDLE ASPIRATION  06/27/2022   Procedure: FINE NEEDLE ASPIRATION;  Surgeon: Wilhelmenia Aloha Raddle., MD;  Location: [facility]OSCOPY;  Service: Gastroenterology;;   HOT HEMOSTASIS N/A 06/27/2022   Procedure: HOT HEMOSTASIS (ARGON PLASMA COAGULATION/BICAP);  Surgeon: Wilhelmenia Aloha Raddle., MD;  Location: [facility];  Service: Gastroenterology;  Laterality: N/A;   IR GASTROSTOMY TUBE MOD SED  06/12/2022   IR GASTROSTOMY TUBE REMOVAL  09/04/2022   IR IMAGING GUIDED  PORT INSERTION  06/12/2022   IR REMOVAL TUN ACCESS W/ PORT W/O FL MOD SED  11/21/2022   LEG SURGERY     Trauma   LOWER EXTREMITY ANGIOGRAPHY Left 11/17/2020   Procedure: Lower Extremity Angiography;  Surgeon: Gretta Lonni PARAS, MD;  Location: [facility] INVASIVE CV LAB;  Service: Cardiovascular;  Laterality: Left;    PATCH ANGIOPLASTY Right 08/29/2020   Procedure: PATCH ANGIOPLASTY RIGHT BELOW KNEE POPLITEAL ARTERY AND RIGHT COMMON FEMORAL ARTERY;  Surgeon: Harvey Carlin BRAVO, MD;  Location: [facility] OR;  Service: Vascular;  Laterality: Right;   RETINAL DETACHMENT SURGERY     RIGID ESOPHAGOSCOPY  04/18/2022   Procedure: RIGID ESOPHAGOSCOPY;  Surgeon: Carlie Clark, MD;  Location: [facility] OR;  Service: ENT;;   TONSILLECTOMY  04/18/2022   Procedure: LEFT TONSILLECTOMY;  Surgeon: Carlie Clark, MD;  Location: [facility] OR;  Service: ENT;;   ULTRASOUND GUIDANCE FOR VASCULAR ACCESS Bilateral 08/29/2020   Procedure: ULTRASOUND GUIDANCE FOR VASCULAR ACCESS;  Surgeon: Harvey Carlin BRAVO, MD;  Location: [facility] OR;  Service: Vascular;  Laterality: Bilateral;   VIDEO BRONCHOSCOPY WITH ENDOBRONCHIAL NAVIGATION Bilateral 02/17/2024   Procedure: VIDEO BRONCHOSCOPY WITH ENDOBRONCHIAL NAVIGATION;  Surgeon: Shelah Lamar RAMAN, MD;  Location: [facility] ENDOSCOPY;  Service: Pulmonary;  Laterality: Bilateral;    SOCIAL HISTORY: Social History   Socioeconomic History   Marital status: Married    Spouse name: Not on file   Number of children: 5   Years of education: Not on file   Highest education level: Master's degree (e.g., MA, MS, MEng, MEd, MSW, MBA)  Occupational History   Not on file  Tobacco Use   Smoking status: Former    Current packs/day: 0.50    Average packs/day: 0.5 packs/day for 50.0 years (25.0 ttl pk-yrs)    Types: Cigarettes    Passive exposure: Never   Smokeless tobacco: Never   Tobacco comments:    Quit smoking few months ago 01/23/2024    Smoked 1 PPD x 60 years     60 pack year smoking history.  Vaping Use   Vaping status: Never Used  Substance and Sexual Activity   Alcohol use: Yes    Alcohol/week: 0.0 standard drinks of alcohol    Comment: 4 to 5 times a week; beer, wine and liquor    Drug use: No   Sexual activity: Not Currently  Other Topics Concern   Not on file  Social History Narrative   Scores reading tests.   Lives with wife.     Social Drivers of Corporate investment banker Strain: Low Risk  (02/28/2024)   Overall Financial Resource Strain (CARDIA)    Difficulty of Paying Living Expenses: Not hard at all  Food Insecurity: No Food Insecurity (02/28/2024)   Hunger Vital Sign    Worried About Running Out of Food in the Last Year: Never true    Ran Out of Food in the Last Year: Never true  Transportation Needs: No Transportation Needs (02/28/2024)   PRAPARE - Administrator, Civil Service (Medical): No    Lack of Transportation (Non-Medical): No  Physical Activity: Insufficiently Active (02/28/2024)   Exercise Vital Sign    Days of Exercise per Week: 4 days    Minutes of Exercise per Session: 30 min  Stress: No Stress Concern Present (02/28/2024)   [employer] of Occupational Health - Occupational Stress Questionnaire    Feeling of Stress: Not at all  Social Connections: Moderately Isolated (02/28/2024)   Social Connection and Isolation Panel  Frequency of Communication with Friends and Family: More than three times a week    Frequency of Social Gatherings with Friends and Family: More than three times a week    Attends Religious Services: Never    Database administrator or Organizations: No    Attends Engineer, structural: Not on file    Marital Status: Married  Catering manager Violence: Not At Risk (02/28/2024)   Humiliation, Afraid, Rape, and Kick questionnaire    Fear of Current or Ex-Partner: No    Emotionally Abused: No    Physically Abused: No    Sexually Abused: No    FAMILY HISTORY: Family History  Problem Relation Age of Onset   Ovarian cancer Mother    Alcoholism Father    Breast cancer Sister    Ovarian cancer Sister     ALLERGIES:  is allergic to zithromax  [azithromycin ] and chlorpheniramine.  MEDICATIONS:  Current Outpatient Medications  Medication Sig Dispense Refill   acetaminophen  (TYLENOL ) 500 MG tablet Take 500 mg by mouth every 6  (six) hours as needed (for pain.).     budesonide-glycopyrrolate -formoterol (BREZTRI  AEROSPHERE) 160-9-4.8 MCG/ACT AERO inhaler Inhale 2 puffs into the lungs in the morning and at bedtime. (Patient not taking: Reported on 04/29/2024) 1 each 6   cetirizine  (ZYRTEC ) 10 MG tablet Take 1 tablet (10 mg total) by mouth daily. 30 tablet 11   cholecalciferol  (VITAMIN D ) 25 MCG (1000 UNIT) tablet Take 1,000 Units by mouth 2 (two) times daily.     diltiazem  (TIADYLT  ER) 120 MG 24 hr capsule Take 1 capsule (120 mg total) by mouth daily. 90 capsule 1   fluticasone  (FLONASE ) 50 MCG/ACT nasal spray Place 2 sprays into both nostrils daily. (Patient not taking: Reported on 04/29/2024) 16 g 6   Fluticasone -Umeclidin-Vilant (TRELEGY ELLIPTA ) 100-62.5-25 MCG/ACT AEPB Inhale 1 puff into the lungs daily. 60 each 5   lidocaine -prilocaine  (EMLA ) cream Apply to affected area once (Patient not taking: Reported on 04/29/2024) 30 g 3   metoprolol  tartrate (LOPRESSOR ) 25 MG tablet Take 1 tablet by mouth twice daily as needed 180 tablet 1   montelukast  (SINGULAIR ) 10 MG tablet Take 1 tablet (10 mg total) by mouth at bedtime. 30 tablet 11   nicotine  (NICODERM CQ  - DOSED IN MG/24 HOURS) 21 mg/24hr patch Place 1 patch (21 mg total) onto the skin daily. Apply 21 mg patch daily x 6 wk, then 14mg  patch daily x 2 wk, then 7 mg patch daily x 2 wk (Patient not taking: Reported on 04/29/2024) 14 patch 2   omeprazole  (PRILOSEC) 40 MG capsule Take 1 capsule (40 mg total) by mouth daily. (Patient not taking: Reported on 04/29/2024) 30 capsule 6   rivaroxaban  (XARELTO ) 20 MG TABS tablet Take 1 tablet (20 mg total) by mouth daily with supper. 90 tablet 1   rosuvastatin  (CRESTOR ) 20 MG tablet Take 1 tablet (20 mg total) by mouth daily. 90 tablet 1   sodium chloride  (OCEAN) 0.65 % SOLN nasal spray Place 1 spray into both nostrils as needed for congestion.     No current facility-administered medications for this visit.     PHYSICAL  EXAMINATION: ECOG PERFORMANCE STATUS: 1 - Symptomatic but completely ambulatory  There were no vitals filed for this visit.   There were no vitals filed for this visit.  Telephone visit.   LABORATORY DATA:  I have reviewed the data as listed Lab Results  Component Value Date   WBC 4.7 04/28/2024   HGB 15.1 04/28/2024  HCT 43.9 04/28/2024   MCV 98.2 04/28/2024   PLT 180 04/28/2024     Chemistry      Component Value Date/Time   NA 136 04/15/2024 0903   NA 141 05/16/2018 1425   K 4.5 04/15/2024 0903   CL 102 04/15/2024 0903   CO2 28 04/15/2024 0903   BUN 17 04/15/2024 0903   BUN 13 05/16/2018 1425   CREATININE 0.95 04/15/2024 0903   CREATININE 0.91 09/17/2023 1332      Component Value Date/Time   CALCIUM  9.5 04/15/2024 0903   ALKPHOS 35 (L) 04/15/2024 0903   AST 14 04/15/2024 0903   AST 14 (L) 09/17/2023 1332   ALT 15 04/15/2024 0903   ALT 14 09/17/2023 1332   BILITOT 0.6 04/15/2024 0903   BILITOT 0.4 09/17/2023 1332       RADIOGRAPHIC STUDIES: I have personally reviewed the radiological images as listed and agreed with the findings in the report. CT Chest W Contrast Result Date: 04/30/2024 CLINICAL DATA:  Lung nodules, multiple. Follow-up. History of neck cancer. * Tracking Code: BO * EXAM: CT CHEST WITH CONTRAST TECHNIQUE: Multidetector CT imaging of the chest was performed during intravenous contrast administration. RADIATION DOSE REDUCTION: This exam was performed according to the departmental dose-optimization program which includes automated exposure control, adjustment of the mA and/or kV according to patient size and/or use of iterative reconstruction technique. CONTRAST:  75mL OMNIPAQUE  IOHEXOL  300 MG/ML  SOLN COMPARISON:  CT scan angiography chest from 01/09/2024. FINDINGS: Cardiovascular: Normal cardiac size. No pericardial effusion. Mild ectasia of the ascending aorta measuring upto 4.1 - 4.2 cm, grossly similar to the prior study. There are coronary artery  calcifications, in keeping with coronary artery disease. There are also mild-to-moderate peripheral atherosclerotic vascular calcifications of thoracic aorta and its major branches. Mediastinum/Nodes: Visualized thyroid  gland appears grossly unremarkable. No solid / cystic mediastinal masses. The esophagus is nondistended precluding optimal assessment. There are multiple mildly prominent mediastinal lymph nodes, which do not meet the size criteria for lymphadenopathy and appear grossly similar to the prior study. No axillary or hilar lymphadenopathy by size criteria. Lungs/Pleura: The central tracheo-bronchial tree is patent. Diffuse mild-to-moderate upper lobe predominant centrilobular emphysema noted. There are 2 solid noncalcified nodules in the left lung upper lobe with largest nodule measuring up to 6 x 6 mm, essentially similar to the prior study. No new or suspicious lung nodule. No mass or consolidation. No pleural effusion or pneumothorax. Upper Abdomen: There is a 2 x 3 mm nonobstructing calculus in the right kidney upper pole. Remaining visualized upper abdominal viscera within normal limits. Musculoskeletal: The visualized soft tissues of the chest wall are grossly unremarkable. No suspicious osseous lesions. There are mild multilevel degenerative changes in the visualized spine. Partially seen lower cervical ACDF. IMPRESSION: 1. There are 2 solid noncalcified nodules in the left lung upper lobe with largest nodule measuring up to 6 x 6 mm, essentially similar to the prior study. No new or suspicious lung nodule. 2. Multiple other nonacute observations, as described above. Aortic Atherosclerosis (ICD10-I70.0) and Emphysema (ICD10-J43.9). Electronically Signed   By: Ree Molt M.D.   On: 04/30/2024 08:59     ASSESSMENT AND PLAN: Assessment and Plan Assessment & Plan  Lymphoproliferative disorder Multiple biopsies couldn't establish the exact type Most recent CBC with no concerns He will  proceed with periodic labs, H and P Will need CBC, CMP and LDH on his next visit  Pulmonary nodules under surveillance Most recent CT scan with 2 solid  non calcified nodules in the left lung upper lobe with largest nodule measuring upto 6 *6 mm essentially similar to the prior study. Discussed with pulmonary team, they recommended follow up with CT in 6 months, Ordered this in March 2026.  Laryngeal cancer under surveillance Last result with Dr Carlie 9/25, Very small bump on the posterior surface of the right soft palate. General edema of epiglottis and larynx consistent with post-radiation changes. No mass or ulceration. Vocal folds mobile with good closure. Some scattered mucus/saliva.  Completed treatment in Dec 2023 A CT scan of the neck done in 12/2023 showed resolution of previously noted lymph nodes and cystic areas    I have spent a total of 30 minutes minutes of face-to-face and non-face-to-face time, preparing to see the patient, performing a medically appropriate examination, counseling and educating the patient, documenting clinical information in the electronic health record, and care coordination.   Amber Stalls MD

## 2024-05-13 NOTE — Progress Notes (Signed)
 CT chest ordered for March to follow up on lung nodules.  Cory [name]

## 2024-05-22 DIAGNOSIS — C44629 Squamous cell carcinoma of skin of left upper limb, including shoulder: Secondary | ICD-10-CM | POA: Diagnosis not present

## 2024-05-28 ENCOUNTER — Other Ambulatory Visit (HOSPITAL_COMMUNITY): Payer: Self-pay

## 2024-06-01 ENCOUNTER — Other Ambulatory Visit: Payer: Self-pay | Admitting: Acute Care

## 2024-06-01 DIAGNOSIS — R911 Solitary pulmonary nodule: Secondary | ICD-10-CM

## 2024-06-10 ENCOUNTER — Ambulatory Visit (HOSPITAL_COMMUNITY): Payer: Medicare (Managed Care)

## 2024-06-19 ENCOUNTER — Ambulatory Visit: Payer: Self-pay

## 2024-06-19 NOTE — Telephone Encounter (Signed)
 FYI Only or Action Required?: FYI only for provider: appointment scheduled on 06/24/2024 at 8:30 AM.  Patient was last seen in primary care on 04/15/2024 by Micheal Wolm ORN, MD.  Called Nurse Triage reporting Hip Pain.  Symptoms began several months ago.  Interventions attempted: Rest, hydration, or home remedies.  Symptoms are: unchanged.  Triage Disposition: See PCP Within 2 Weeks  Patient/caregiver understands and will follow disposition?: Yes  Copied from CRM #[id number]. Topic: Clinical - Red Word Triage >> Jun 19, 2024 11:21 AM Victoria A wrote: [facility] that prompted transfer to Nurse Triage: Patient is having pain in hip has been ongoing Reason for Disposition  Hip pain is a chronic symptom (recurrent or ongoing AND present > 4 weeks)  Answer Assessment - Initial Assessment Questions Patient with chronic right hip pain. Patient states pain has gotten worse in the last few weeks and would like to speak with provider about other ideas. Patient states he is needing to get lab work done as well and would like an appointment. Scheduled to see PCP on 06/24/2024 at 8:30 AM. Patient asked for a morning due to needing lab work.   1. LOCATION and RADIATION: Where is the pain located? Does the pain spread (shoot) anywhere else?     Right hip 2. QUALITY: What does the pain feel like?  (e.g., sharp, dull, aching, burning)     sharp 3. SEVERITY: How bad is the pain? What does it keep you from doing?   (Scale 1-10; or mild, moderate, severe)     6 out of 10 4. ONSET: When did the pain start? Does it come and go, or is it there all the time?     Chronic pain 5. WORK OR EXERCISE: Has there been any recent work or exercise that involved this part of the body?      no 6. CAUSE: What do you think is causing the hip pain?      unknown 7. AGGRAVATING FACTORS: What makes the hip pain worse? (e.g., walking, climbing stairs, running)     walking 8. OTHER SYMPTOMS: Do you  have any other symptoms? (e.g., back pain, pain shooting down leg,  fever, rash)     Pain shooting down the leg  Protocols used: Hip Pain-A-AH

## 2024-06-24 ENCOUNTER — Ambulatory Visit: Payer: Self-pay | Admitting: Family Medicine

## 2024-06-24 ENCOUNTER — Encounter: Payer: Self-pay | Admitting: Family Medicine

## 2024-06-24 ENCOUNTER — Ambulatory Visit: Payer: Medicare (Managed Care) | Admitting: Family Medicine

## 2024-06-24 ENCOUNTER — Ambulatory Visit (HOSPITAL_COMMUNITY)
Admission: RE | Admit: 2024-06-24 | Discharge: 2024-06-24 | Disposition: A | Discharge status: Home / Self Care | Payer: Medicare (Managed Care) | Source: Ambulatory Visit | Attending: Cardiology | Admitting: Cardiology

## 2024-06-24 VITALS — BP 160/80 | HR 52 | Temp 97.4°F | Wt 215.7 lb

## 2024-06-24 DIAGNOSIS — G5701 Lesion of sciatic nerve, right lower limb: Secondary | ICD-10-CM

## 2024-06-24 DIAGNOSIS — I350 Nonrheumatic aortic (valve) stenosis: Secondary | ICD-10-CM | POA: Insufficient documentation

## 2024-06-24 DIAGNOSIS — E785 Hyperlipidemia, unspecified: Secondary | ICD-10-CM | POA: Diagnosis not present

## 2024-06-24 DIAGNOSIS — I10 Essential (primary) hypertension: Secondary | ICD-10-CM | POA: Diagnosis not present

## 2024-06-24 LAB — ECHOCARDIOGRAM COMPLETE
AR max vel: 1.27 cm2
AV Area VTI: 1.33 cm2
AV Area mean vel: 1.26 cm2
AV Mean grad: 35 mmHg
AV Peak grad: 61.5 mmHg
Ao pk vel: 3.92 m/s
Area-P 1/2: 2.62 cm2
P 1/2 time: 479 ms
S' Lateral: 3.25 cm
Weight: 3451.2 [oz_av]

## 2024-06-24 LAB — LIPID PANEL
Cholesterol: 114 mg/dL (ref 0–200)
HDL: 59.1 mg/dL (ref >39.00)
LDL Cholesterol: 43 mg/dL (ref 0–99)
NonHDL: 54.82
Total CHOL/HDL Ratio: 2
Triglycerides: 59 mg/dL (ref 0.0–149.0)
VLDL: 11.8 mg/dL (ref 0.0–40.0)

## 2024-06-24 MED ORDER — METHYLPREDNISOLONE ACETATE 80 MG/ML IJ SUSP
80.0000 mg | Freq: Once | INTRAMUSCULAR | Status: AC
  Administered 2024-06-24: 80 mg via INTRAMUSCULAR

## 2024-06-24 MED ORDER — VALSARTAN 80 MG PO TABS: 80.0000 mg | ORAL_TABLET | Freq: Every day | ORAL | 3 refills | Status: AC

## 2024-06-24 NOTE — Addendum Note (Signed)
 Addended by: METTA KRISTEN CROME on: 06/24/2024 09:07 AM   Modules accepted: Orders

## 2024-06-24 NOTE — Progress Notes (Signed)
 Established Patient Office Visit  Subjective   Patient ID: Cory [name], male    DOB: August 29, 1945  Age: 78 y.o. MRN: [id number]  Chief Complaint  Patient presents with   Hip Pain    HPI   Cory [name] has multiple medical problems including history of abdominal aortic aneurysm, aortic stenosis, atrial fibrillation, hypertension, peripheral vascular disease, cancer of the supraglottis, dyslipidemia, nicotine  use  Seen today with recent flare of chronic problem which is right piriformis syndrome.  He is getting ready to go next week to [facility] and is requesting possibly shot of cortisone which has helped in the past.  He has tried physical therapy without much benefit.  Pain is mostly right buttock region but occasionally radiates toward the thigh.  Denies any lower extremity numbness or weakness.  He has hyperlipidemia and last visit had not been on his rosuvastatin .  Now taking this regularly.  Needs follow-up lipids today.  He is fasting.  He has hypertension history.  Currently on diltiazem  120 mg daily.  Has had multiple blood pressure readings over 140 even by home readings recently.  Some alcohol use but usually no more than 2 alcoholic drinks per day.  Past Medical History:  Diagnosis Date   AAA (abdominal aortic aneurysm)    Aortic stenosis    a. Moderate by echo 05/2020.   Arthritis    right hip   Cancer (HCC)    skin cancer nose, right left, upper left basel cell   Dysrhythmia    a-fib   Heart murmur    Malignant neoplasm of supraglottis (HCC) 05/2022   PAF (paroxysmal atrial fibrillation) (HCC)    Peripheral arterial disease    Tobacco abuse    Past Surgical History:  Procedure Laterality Date   ABDOMINAL AORTIC ENDOVASCULAR STENT GRAFT N/A 08/29/2020   Procedure: ABDOMINAL AORTIC ENDOVASCULAR STENT GRAFT;  Surgeon: Harvey Carlin BRAVO, MD;  Location: [facility] OR;  Service: Vascular;  Laterality: N/A;   ABDOMINAL AORTOGRAM W/LOWER EXTREMITY Bilateral 06/24/2020    Procedure: ABDOMINAL AORTOGRAM W/LOWER EXTREMITY;  Surgeon: Harvey Carlin BRAVO, MD;  Location: [facility] INVASIVE CV LAB;  Service: Cardiovascular;  Laterality: Bilateral;   BACK SURGERY     2005   BIOPSY  05/21/2022   Procedure: BIOPSY;  Surgeon: Wilhelmenia Aloha Raddle., MD;  Location: [facility];  Service: Gastroenterology;;   BRONCHIAL BIOPSY  02/17/2024   Procedure: BRONCHOSCOPY, WITH BIOPSY;  Surgeon: Shelah Lamar GORMAN, MD;  Location: [facility] ENDOSCOPY;  Service: Pulmonary;;   BRONCHIAL BRUSHINGS  02/17/2024   Procedure: BRONCHOSCOPY, WITH BRUSH BIOPSY;  Surgeon: Shelah Lamar GORMAN, MD;  Location: [facility] ENDOSCOPY;  Service: Pulmonary;;   BRONCHIAL NEEDLE ASPIRATION BIOPSY  02/17/2024   Procedure: BRONCHOSCOPY, WITH NEEDLE ASPIRATION BIOPSY;  Surgeon: Shelah Lamar GORMAN, MD;  Location: [facility] ENDOSCOPY;  Service: Pulmonary;;   BRONCHIAL WASHINGS  02/17/2024   Procedure: IRRIGATION, BRONCHUS;  Surgeon: Shelah Lamar GORMAN, MD;  Location: [facility] ENDOSCOPY;  Service: Pulmonary;;   CERVICAL SPINE SURGERY     DIRECT LARYNGOSCOPY Bilateral 04/18/2022   Procedure: DIRECT LARYNGOSCOPY WITH BIOPSIES;  Surgeon: Carlie Clark, MD;  Location: [facility] OR;  Service: ENT;  Laterality: Bilateral;   EMBOLECTOMY Right 08/29/2020   Procedure: POPLITEAL AND TIBIAL EMBOLECTOMY;  Surgeon: Harvey Carlin BRAVO, MD;  Location: [facility] OR;  Service: Vascular;  Laterality: Right;   EMBOLIZATION (CATH LAB) Right 06/24/2020   Procedure: EMBOLIZATION;  Surgeon: Harvey Carlin BRAVO, MD;  Location: [facility];  Service: Cardiovascular;  Laterality: Right;  ENDARTERECTOMY FEMORAL Right 08/29/2020   Procedure: ENDARTERECTOMY FEMORAL;  Surgeon: Harvey Carlin BRAVO, MD;  Location: [facility] OR;  Service: Vascular;  Laterality: Right;   ESOPHAGOGASTRODUODENOSCOPY N/A 06/27/2022   Procedure: ESOPHAGOGASTRODUODENOSCOPY (EGD);  Surgeon: Wilhelmenia Aloha Raddle., MD;  Location: [facility];  Service: Gastroenterology;  Laterality: N/A;   ESOPHAGOGASTRODUODENOSCOPY (EGD) WITH  PROPOFOL  N/A 05/21/2022   Procedure: ESOPHAGOGASTRODUODENOSCOPY (EGD) WITH PROPOFOL ;  Surgeon: Wilhelmenia Aloha Raddle., MD;  Location: [facility] END[facility]COPY;  Service: Gastroenterology;  Laterality: N/A;   EUS N/A 05/21/2022   Procedure: UPPER ENDOSCOPIC ULTRASOUND (EUS) RADIAL;  Surgeon: Wilhelmenia Aloha Raddle., MD;  Location: [facility] ENDOSCOPY;  Service: Gastroenterology;  Laterality: N/A;   EUS N/A 06/27/2022   Procedure: UPPER ENDOSCOPIC ULTRASOUND (EUS) RADIAL;  Surgeon: Wilhelmenia Aloha Raddle., MD;  Location: [facility];  Service: Gastroenterology;  Laterality: N/A;   EYE SURGERY     FEMORAL ARTERY EXPLORATION Right 06/25/2020   Procedure: RIGHT GROIN EXPLORATION Evacuation of Hematoma  WITH REPAIR OF RIGHT Common FEMORAL ARTERY.;  Surgeon: Eliza Lonni RAMAN, MD;  Location: [facility] OR;  Service: Vascular;  Laterality: Right;   FEMORAL-POPLITEAL BYPASS GRAFT Left 11/22/2020   Procedure: LEFT ABOVE KNEE-BELOW KNEE BYPASS TIBIAL PERITONEAL TRUNK REVERSE IPSILATERAL GREATER SAPHENOUS VEIN;  Surgeon: Harvey Carlin BRAVO, MD;  Location: [facility] OR;  Service: Vascular;  Laterality: Left;   FINE NEEDLE ASPIRATION N/A 05/21/2022   Procedure: FINE NEEDLE ASPIRATION (FNA) LINEAR;  Surgeon: Wilhelmenia Aloha Raddle., MD;  Location: [facility];  Service: Gastroenterology;  Laterality: N/A;   FINE NEEDLE ASPIRATION  06/27/2022   Procedure: FINE NEEDLE ASPIRATION;  Surgeon: Wilhelmenia Aloha Raddle., MD;  Location: [facility];  Service: Gastroenterology;;   HOT HEMOSTASIS N/A 06/27/2022   Procedure: HOT HEMOSTASIS (ARGON PLASMA COAGULATION/BICAP);  Surgeon: Wilhelmenia Aloha Raddle., MD;  Location: [facility];  Service: Gastroenterology;  Laterality: N/A;   IR GASTROSTOMY TUBE MOD SED  06/12/2022   IR GASTROSTOMY TUBE REMOVAL  09/04/2022   IR IMAGING GUIDED PORT INSERTION  06/12/2022   IR REMOVAL TUN ACCESS W/ PORT W/O FL MOD SED  11/21/2022   LEG SURGERY     Trauma   LOWER EXTREMITY ANGIOGRAPHY Left 11/17/2020   Procedure:  Lower Extremity Angiography;  Surgeon: Gretta Lonni PARAS, MD;  Location: [facility] INVASIVE CV LAB;  Service: Cardiovascular;  Laterality: Left;   PATCH ANGIOPLASTY Right 08/29/2020   Procedure: PATCH ANGIOPLASTY RIGHT BELOW KNEE POPLITEAL ARTERY AND RIGHT COMMON FEMORAL ARTERY;  Surgeon: Harvey Carlin BRAVO, MD;  Location: [facility] OR;  Service: Vascular;  Laterality: Right;   RETINAL DETACHMENT SURGERY     RIGID ESOPHAGOSCOPY  04/18/2022   Procedure: RIGID ESOPHAGOSCOPY;  Surgeon: Carlie Clark, MD;  Location: [facility] OR;  Service: ENT;;   TONSILLECTOMY  04/18/2022   Procedure: LEFT TONSILLECTOMY;  Surgeon: Carlie Clark, MD;  Location: [facility] OR;  Service: ENT;;   ULTRASOUND GUIDANCE FOR VASCULAR ACCESS Bilateral 08/29/2020   Procedure: ULTRASOUND GUIDANCE FOR VASCULAR ACCESS;  Surgeon: Harvey Carlin BRAVO, MD;  Location: [facility] OR;  Service: Vascular;  Laterality: Bilateral;   VIDEO BRONCHOSCOPY WITH ENDOBRONCHIAL NAVIGATION Bilateral 02/17/2024   Procedure: VIDEO BRONCHOSCOPY WITH ENDOBRONCHIAL NAVIGATION;  Surgeon: Shelah Lamar RAMAN, MD;  Location: [facility] ENDOSCOPY;  Service: Pulmonary;  Laterality: Bilateral;    reports that he has quit smoking. His smoking use included cigarettes. He has a 25 pack-year smoking history. He has never been exposed to tobacco smoke. He has never used smokeless tobacco. He reports current alcohol use. He reports that he does not use drugs. family history includes  Alcoholism in his father; Breast cancer in his sister; Ovarian cancer in his mother and sister. Allergies  Allergen Reactions   Zithromax  [Azithromycin ] Diarrhea   Chlorpheniramine Cough and Itching    Review of Systems  Constitutional:  Negative for malaise/fatigue.  Eyes:  Negative for blurred vision.  Cardiovascular:  Negative for chest pain.  Musculoskeletal:  Positive for back pain.  Neurological:  Negative for dizziness, weakness and headaches.      Objective:     BP (!) 160/80   Pulse (!) 52   Temp (!) 97.4 F (36.3  C) (Oral)   Wt 215 lb 11.2 oz (97.8 kg)   SpO2 94%   BMI 26.26 kg/m  BP Readings from Last 3 Encounters:  06/24/24 (!) 160/80  04/29/24 128/80  04/28/24 138/77   Wt Readings from Last 3 Encounters:  06/24/24 215 lb 11.2 oz (97.8 kg)  04/29/24 215 lb 3.2 oz (97.6 kg)  04/28/24 214 lb 3.2 oz (97.2 kg)      Physical Exam Constitutional:      Appearance: He is well-developed.  HENT:     Right Ear: External ear normal.     Left Ear: External ear normal.  Eyes:     Pupils: Pupils are equal, round, and reactive to light.  Neck:     Thyroid : No thyromegaly.  Cardiovascular:     Rate and Rhythm: Normal rate and regular rhythm.     Heart sounds: Murmur heard.     Comments: 2/6 to 3/6 systolic ejection murmur right upper sternal border consistent with his aortic stenosis. Pulmonary:     Effort: Pulmonary effort is normal. No respiratory distress.     Breath sounds: Normal breath sounds. No wheezing or rales.  Musculoskeletal:     Cervical back: Neck supple.     Right lower leg: No edema.     Left lower leg: No edema.     Comments: Straight leg raise is negative on the right  Neurological:     Mental Status: He is alert and oriented to person, place, and time.      No results found for any visits on 06/24/24.    The ASCVD Risk score (Arnett DK, et al., 2019) failed to calculate for the following reasons:   The 2019 ASCVD risk score is only valid for ages 39 to 3    Assessment & Plan:   #1 longstanding history of right piriformis syndrome.  Recent flareup.  [place] ready [place].  Requesting shot of cortisone which has helped in the past.  We agreed to Depo-Medrol  80 mg IM.  He has no history of diabetes.  He cannot take nonsteroidals.  He has had physical therapy in the past without much benefit.  #2 hypertension poorly controlled.  Multiple readings over 140 systolic.  Handout on DASH diet given.  Continue Cardizem  CD 120 mg daily.  Add valsartan   80 mg daily.  Set up approximately 6-week follow-up to reassess  #3 hyperlipidemia treated with rosuvastatin  20 mg daily.  Goal LDL less than 70.  Recheck fasting lipid today.   Return in about 6 weeks (around 08/05/2024).    Wolm Scarlet, MD

## 2024-07-01 NOTE — Progress Notes (Signed)
 Attempted to call patient in regard to his echo results. No answer left a vm to call back.  Also sent him a mychart message about the echo result.

## 2024-07-01 NOTE — Addendum Note (Signed)
 Addended by: Kinslee Dalpe N on: 07/01/2024 03:31 PM   Modules accepted: Orders

## 2024-07-01 NOTE — Telephone Encounter (Signed)
-----   Message from Lynwood Schilling sent at 06/28/2024 10:11 AM EST ----- The EF is normal.  The aortic valve is moderately stiff and mild to moderately leaky.  I will follow this with repeat studies.  Nothing needs to be done currently.  He needs to have follow up CT of  his aorta in one year to look again at size.   ----- Message ----- From: Interface, [employer] Sent: 06/24/2024   7:20 PM EST To: Lynwood Schilling, MD

## 2024-07-06 ENCOUNTER — Telehealth: Payer: Self-pay

## 2024-07-06 NOTE — Telephone Encounter (Signed)
 Called  to schedule 3 month follow CT. LVM. Due 07/31/2024

## 2024-07-10 ENCOUNTER — Encounter: Payer: Self-pay | Admitting: Acute Care

## 2024-07-14 ENCOUNTER — Other Ambulatory Visit: Payer: Self-pay | Admitting: Cardiology

## 2024-07-27 ENCOUNTER — Other Ambulatory Visit: Payer: Medicare (Managed Care)

## 2024-07-27 ENCOUNTER — Other Ambulatory Visit: Payer: Self-pay

## 2024-08-04 ENCOUNTER — Inpatient Hospital Stay: Payer: Medicare (Managed Care) | Attending: Hematology and Oncology

## 2024-08-04 ENCOUNTER — Inpatient Hospital Stay: Payer: Medicare (Managed Care) | Admitting: Hematology and Oncology

## 2024-08-04 VITALS — BP 130/82 | HR 65 | Temp 98.4°F | Resp 18 | Ht 76.0 in | Wt 214.5 lb

## 2024-08-04 DIAGNOSIS — D479 Neoplasm of uncertain behavior of lymphoid, hematopoietic and related tissue, unspecified: Secondary | ICD-10-CM | POA: Diagnosis not present

## 2024-08-04 DIAGNOSIS — C321 Malignant neoplasm of supraglottis: Secondary | ICD-10-CM | POA: Diagnosis not present

## 2024-08-04 DIAGNOSIS — R918 Other nonspecific abnormal finding of lung field: Secondary | ICD-10-CM | POA: Diagnosis not present

## 2024-08-04 DIAGNOSIS — Z87891 Personal history of nicotine dependence: Secondary | ICD-10-CM | POA: Insufficient documentation

## 2024-08-04 DIAGNOSIS — Z8521 Personal history of malignant neoplasm of larynx: Secondary | ICD-10-CM | POA: Diagnosis present

## 2024-08-04 DIAGNOSIS — Z08 Encounter for follow-up examination after completed treatment for malignant neoplasm: Secondary | ICD-10-CM | POA: Insufficient documentation

## 2024-08-04 DIAGNOSIS — E785 Hyperlipidemia, unspecified: Secondary | ICD-10-CM

## 2024-08-04 LAB — CBC WITH DIFFERENTIAL/PLATELET
Abs Immature Granulocytes: 0.03 K/uL (ref 0.00–0.07)
Basophils Absolute: 0 K/uL (ref 0.0–0.1)
Basophils Relative: 1 %
Eosinophils Absolute: 0.1 K/uL (ref 0.0–0.5)
Eosinophils Relative: 2 %
HCT: 43 % (ref 39.0–52.0)
Hemoglobin: 14.7 g/dL (ref 13.0–17.0)
Immature Granulocytes: 1 %
Lymphocytes Relative: 18 %
Lymphs Abs: 1 K/uL (ref 0.7–4.0)
MCH: 33.5 pg (ref 26.0–34.0)
MCHC: 34.2 g/dL (ref 30.0–36.0)
MCV: 97.9 fL (ref 80.0–100.0)
Monocytes Absolute: 0.6 K/uL (ref 0.1–1.0)
Monocytes Relative: 10 %
Neutro Abs: 3.7 K/uL (ref 1.7–7.7)
Neutrophils Relative %: 68 %
Platelets: 155 K/uL (ref 150–400)
RBC: 4.39 MIL/uL (ref 4.22–5.81)
RDW: 13.8 % (ref 11.5–15.5)
WBC: 5.5 K/uL (ref 4.0–10.5)
nRBC: 0 % (ref 0.0–0.2)

## 2024-08-04 LAB — CMP (CANCER CENTER ONLY)
ALT: 22 U/L (ref 0–44)
AST: 22 U/L (ref 15–41)
Albumin: 3.8 g/dL (ref 3.5–5.0)
Alkaline Phosphatase: 32 U/L — ABNORMAL LOW (ref 38–126)
Anion gap: 7 (ref 5–15)
BUN: 16 mg/dL (ref 8–23)
CO2: 29 mmol/L (ref 22–32)
Calcium: 8.7 mg/dL — ABNORMAL LOW (ref 8.9–10.3)
Chloride: 103 mmol/L (ref 98–111)
Creatinine: 1 mg/dL (ref 0.61–1.24)
GFR, Estimated: >60 mL/min
Glucose, Bld: 84 mg/dL (ref 70–99)
Potassium: 4.1 mmol/L (ref 3.5–5.1)
Sodium: 138 mmol/L (ref 135–145)
Total Bilirubin: 0.5 mg/dL (ref 0.0–1.2)
Total Protein: 6 g/dL — ABNORMAL LOW (ref 6.5–8.1)

## 2024-08-04 LAB — LACTATE DEHYDROGENASE: LDH: 163 U/L (ref 105–235)

## 2024-08-04 NOTE — Progress Notes (Signed)
 [facility] CONSULT NOTE  Patient Care Team: Cory Wolm ORN, MD as PCP - General Cory Agent, MD as PCP - Cardiology (Cardiology) Cory [name], [facility] (Inactive) as Pharmacist (Pharmacist) Malmfelt, Cory CROME, RN as Oncology Nurse Navigator Cory Domino, MD as Consulting Physician (Radiation Oncology) Cory Ash, MD as Consulting Physician (Hematology and Oncology) Cory Clark, MD as Consulting Physician (Otolaryngology)  CHIEF COMPLAINTS/PURPOSE OF CONSULTATION:  SCC supraglottis  ONCOLOGIC HISTORY: Initially presented to ENT with chronic left-sided sore throat for almost a year.  Initial evaluation did not show any possible causes for sore throat.  He was recommended PPI twice daily but went back to ENT with persistent sore throat he once again had a repeat fiberoptic exam which was stable.   CT neck done on November 08, 2021 did show subcentimeter focus of mucosal hyperenhancement along the posterior aspect of the uvula  laryngoscopy with direct visualization recommended to exclude a mucosal lesion at the site.  Additionally few tiny cystic-appearing foci along the left glossotonsillar sulcus.  Direct visualization recommended.  Nonspecific mildly enlarged right level 2 lymph node.  Multiple small calcific foci in the left palatine tonsil.Repeat fiberoptic exam was unremarkable hence Dr. 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  Tongue biopsy showed benign squamous mucosa with low to intermediate grade dysplasia. 05/07/2022: PET/CT showed asymmetric hypermetabolism in the left oropharynx and involving the left epiglottis.  Contralateral hypermetabolic level 2 cervical lymph nodes compatible with metastatic disease.  No hypermetabolic adenopathy in the left neck or in the chest.  1.2  cm short axis aortocaval lymph node in the upper abdomen is hypermetabolic metastatic disease is a concern.  This lymph node has increased from 0.7 cm on the study from 2022.  No other metastatic disease. Received radiation from 05/30/2022-07/18/2022 and weekly cisplatin  from 06/01/2022-07/18/2022.   INTERIM HISTORY:  Cory [name] returns for a follow up after completion of chemoradiation on 07/18/2022.   Discussed the use of AI scribe software for clinical note transcription with the patient, who gave verbal consent to proceed. History of Present Illness  [name] is an 78 year old male with history of Head and neck cancer/lymphoproliferative disorder who is here for follow up.  He remains asymptomatic, denying fevers, drenching night sweats, weight loss, anorexia, new or enlarging masses, infections, or lymphadenopathy. Bowel and urinary habits are unchanged.  He continues to follow with ENT, Dr Cory, future appt in February He also maintains contact with pulmonary specialists for ongoing surveillance of the lung nodules.  He reports feeling well overall, with no new symptoms or concerns since the last oncology appointment in October.   Rest of the pertinent 10 point ROS reviewed and neg.  Wt Readings from Last 3 Encounters:  08/04/24 214 lb 8 oz (97.3 kg)  06/24/24 215 lb 11.2 oz (97.8 kg)  04/29/24 215 lb 3.2 oz (97.6 kg)    MEDICAL HISTORY:  Past Medical History:  Diagnosis Date   AAA (abdominal aortic aneurysm)    Aortic stenosis    a. Moderate by echo 05/2020.   Arthritis    right hip   Cancer (HCC)    skin cancer nose, right left, upper left basel cell   Dysrhythmia    a-fib   Heart murmur    Malignant neoplasm of supraglottis (HCC) 05/2022   PAF (paroxysmal atrial fibrillation) (HCC)  Peripheral arterial disease    Tobacco abuse     SURGICAL HISTORY: Past Surgical History:  Procedure Laterality Date   ABDOMINAL AORTIC ENDOVASCULAR STENT GRAFT  N/A 08/29/2020   Procedure: ABDOMINAL AORTIC ENDOVASCULAR STENT GRAFT;  Surgeon: Cory Carlin BRAVO, MD;  Location: [facility] OR;  Service: Vascular;  Laterality: N/A;   ABDOMINAL AORTOGRAM W/LOWER EXTREMITY Bilateral 06/24/2020   Procedure: ABDOMINAL AORTOGRAM W/LOWER EXTREMITY;  Surgeon: Cory Carlin BRAVO, MD;  Location: [facility] INVASIVE CV LAB;  Service: Cardiovascular;  Laterality: Bilateral;   BACK SURGERY     2005   BIOPSY  05/21/2022   Procedure: BIOPSY;  Surgeon: Cory Aloha Raddle., MD;  Location: [facility];  Service: Gastroenterology;;   BRONCHIAL BIOPSY  02/17/2024   Procedure: BRONCHOSCOPY, WITH BIOPSY;  Surgeon: Cory Lamar RAMAN, MD;  Location: [facility] ENDOSCOPY;  Service: Pulmonary;;   BRONCHIAL BRUSHINGS  02/17/2024   Procedure: BRONCHOSCOPY, WITH BRUSH BIOPSY;  Surgeon: Cory Lamar RAMAN, MD;  Location: [facility] ENDOSCOPY;  Service: Pulmonary;;   BRONCHIAL NEEDLE ASPIRATION BIOPSY  02/17/2024   Procedure: BRONCHOSCOPY, WITH NEEDLE ASPIRATION BIOPSY;  Surgeon: Cory Lamar RAMAN, MD;  Location: [facility] ENDOSCOPY;  Service: Pulmonary;;   BRONCHIAL WASHINGS  02/17/2024   Procedure: IRRIGATION, BRONCHUS;  Surgeon: Cory Lamar RAMAN, MD;  Location: [facility] ENDOSCOPY;  Service: Pulmonary;;   CERVICAL SPINE SURGERY     DIRECT LARYNGOSCOPY Bilateral 04/18/2022   Procedure: DIRECT LARYNGOSCOPY WITH BIOPSIES;  Surgeon: Cory Clark, MD;  Location: [facility] OR;  Service: ENT;  Laterality: Bilateral;   EMBOLECTOMY Right 08/29/2020   Procedure: POPLITEAL AND TIBIAL EMBOLECTOMY;  Surgeon: Cory Carlin BRAVO, MD;  Location: [facility] OR;  Service: Vascular;  Laterality: Right;   EMBOLIZATION (CATH LAB) Right 06/24/2020   Procedure: EMBOLIZATION;  Surgeon: Cory Carlin BRAVO, MD;  Location: [facility]VAS[facility] CV LAB;  Service: Cardiovascular;  Laterality: Right;   ENDARTERECTOMY FEMORAL Right 08/29/2020   Procedure: ENDARTERECTOMY FEMORAL;  Surgeon: Cory Carlin BRAVO, MD;  Location: [facility] OR;  Service: Vascular;  Laterality: Right;    ESOPHAGOGASTRODUODENOSCOPY N/A 06/27/2022   Procedure: ESOPHAGOGASTRODUODENOSCOPY (EGD);  Surgeon: Cory Aloha Raddle., MD;  Location: [facility];  Service: Gastroenterology;  Laterality: N/A;   ESOPHAGOGASTRODUODENOSCOPY (EGD) WITH PROPOFOL  N/A 05/21/2022   Procedure: ESOPHAGOGASTRODUODENOSCOPY (EGD) WITH PROPOFOL ;  Surgeon: Cory Aloha Raddle., MD;  Location: [facility] ENDOSCOPY;  Service: Gastroenterology;  Laterality: N/A;   EUS N/A 05/21/2022   Procedure: UPPER ENDOSCOPIC ULTRASOUND (EUS) RADIAL;  Surgeon: Cory Aloha Raddle., MD;  Location: [facility] ENDOSCOPY;  Service: Gastroenterology;  Laterality: N/A;   EUS N/A 06/27/2022   Procedure: UPPER ENDOSCOPIC ULTRASOUND (EUS) RADIAL;  Surgeon: Cory Aloha Raddle., MD;  Location: [facility]OSCOPY;  Service: Gastroenterology;  Laterality: N/A;   EYE SURGERY     FEMORAL ARTERY EXPLORATION Right 06/25/2020   Procedure: RIGHT GROIN EXPLORATION Evacuation of Hematoma  WITH REPAIR OF RIGHT Common FEMORAL ARTERY.;  Surgeon: Eliza Lonni RAMAN, MD;  Location: [facility] OR;  Service: Vascular;  Laterality: Right;   FEMORAL-POPLITEAL BYPASS GRAFT Left 11/22/2020   Procedure: LEFT ABOVE KNEE-BELOW KNEE BYPASS TIBIAL PERITONEAL TRUNK REVERSE IPSILATERAL GREATER SAPHENOUS VEIN;  Surgeon: Cory Carlin BRAVO, MD;  Location: [facility] OR;  Service: Vascular;  Laterality: Left;   FINE NEEDLE ASPIRATION N/A 05/21/2022   Procedure: FINE NEEDLE ASPIRATION (FNA) LINEAR;  Surgeon: Cory Aloha Raddle., MD;  Location: [facility];  Service: Gastroenterology;  Laterality: N/A;   FINE NEEDLE ASPIRATION  06/27/2022   Procedure: FINE NEEDLE ASPIRATION;  Surgeon: Cory Aloha Raddle., MD;  Location: [facility];  Service: Gastroenterology;;  HOT HEMOSTASIS N/A 06/27/2022   Procedure: HOT HEMOSTASIS (ARGON PLASMA COAGULATION/BICAP);  Surgeon: Cory Aloha Raddle., MD;  Location: [facility];  Service: Gastroenterology;  Laterality: N/A;   IR GASTROSTOMY TUBE MOD SED   06/12/2022   IR GASTROSTOMY TUBE REMOVAL  09/04/2022   IR IMAGING GUIDED PORT INSERTION  06/12/2022   IR REMOVAL TUN ACCESS W/ PORT W/O FL MOD SED  11/21/2022   LEG SURGERY     Trauma   LOWER EXTREMITY ANGIOGRAPHY Left 11/17/2020   Procedure: Lower Extremity Angiography;  Surgeon: Gretta Lonni PARAS, MD;  Location: [facility] INVASIVE CV LAB;  Service: Cardiovascular;  Laterality: Left;   PATCH ANGIOPLASTY Right 08/29/2020   Procedure: PATCH ANGIOPLASTY RIGHT BELOW KNEE POPLITEAL ARTERY AND RIGHT COMMON FEMORAL ARTERY;  Surgeon: Cory Carlin BRAVO, MD;  Location: [facility] OR;  Service: Vascular;  Laterality: Right;   RETINAL DETACHMENT SURGERY     RIGID ESOPHAGOSCOPY  04/18/2022   Procedure: RIGID ESOPHAGOSCOPY;  Surgeon: Cory Clark, MD;  Location: [facility] OR;  Service: ENT;;   TONSILLECTOMY  04/18/2022   Procedure: LEFT TONSILLECTOMY;  Surgeon: Cory Clark, MD;  Location: [facility] OR;  Service: ENT;;   ULTRASOUND GUIDANCE FOR VASCULAR ACCESS Bilateral 08/29/2020   Procedure: ULTRASOUND GUIDANCE FOR VASCULAR ACCESS;  Surgeon: Cory Carlin BRAVO, MD;  Location: [facility] OR;  Service: Vascular;  Laterality: Bilateral;   VIDEO BRONCHOSCOPY WITH ENDOBRONCHIAL NAVIGATION Bilateral 02/17/2024   Procedure: VIDEO BRONCHOSCOPY WITH ENDOBRONCHIAL NAVIGATION;  Surgeon: Cory Lamar RAMAN, MD;  Location: [facility] ENDOSCOPY;  Service: Pulmonary;  Laterality: Bilateral;    SOCIAL HISTORY: Social History   Socioeconomic History   Marital status: Married    Spouse name: Not on file   Number of children: 5   Years of education: Not on file   Highest education level: Master's degree (e.g., MA, MS, MEng, MEd, MSW, MBA)  Occupational History   Not on file  Tobacco Use   Smoking status: Former    Current packs/day: 0.50    Average packs/day: 0.5 packs/day for 50.0 years (25.0 ttl pk-yrs)    Types: Cigarettes    Passive exposure: Never   Smokeless tobacco: Never   Tobacco comments:    Quit smoking few months ago 01/23/2024    Smoked 1 PPD x  60 years     60 pack year smoking history.  Vaping Use   Vaping status: Never Used  Substance and Sexual Activity   Alcohol use: Yes    Alcohol/week: 0.0 standard drinks of alcohol    Comment: 4 to 5 times a week; beer, wine and liquor    Drug use: No   Sexual activity: Not Currently  Other Topics Concern   Not on file  Social History Narrative   Scores reading tests.  Lives with wife.     Social Drivers of Health   Tobacco Use: Medium Risk (06/24/2024)   Patient History    Smoking Tobacco Use: Former    Smokeless Tobacco Use: Never    Passive Exposure: Never  Physicist, Medical Strain: Low Risk (06/23/2024)   Overall Financial Resource Strain (CARDIA)    Difficulty of Paying Living Expenses: Not hard at all  Food Insecurity: No Food Insecurity (06/23/2024)   Epic    Worried About Programme Researcher, Broadcasting/film/video in the Last Year: Never true    Ran Out of Food in the Last Year: Never true  Transportation Needs: No Transportation Needs (06/23/2024)   Epic    Lack of Transportation (Medical): No    Lack  of Transportation (Non-Medical): No  Physical Activity: Sufficiently Active (06/23/2024)   Exercise Vital Sign    Days of Exercise per Week: 5 days    Minutes of Exercise per Session: 30 min  Stress: No Stress Concern Present (06/23/2024)   [employer] of Occupational Health - Occupational Stress Questionnaire    Feeling of Stress: Not at all  Social Connections: Moderately Isolated (06/23/2024)   Social Connection and Isolation Panel    Frequency of Communication with Friends and Family: More than three times a week    Frequency of Social Gatherings with Friends and Family: More than three times a week    Attends Religious Services: Never    Database Administrator or Organizations: No    Attends Engineer, Structural: Not on file    Marital Status: Married  Catering Manager Violence: Not At Risk (02/28/2024)   Epic    Fear of Current or Ex-Partner: No    Emotionally  Abused: No    Physically Abused: No    Sexually Abused: No  Depression (PHQ2-9): Low Risk (04/28/2024)   Depression (PHQ2-9)    PHQ-2 Score: 0  Alcohol Screen: Low Risk (06/23/2024)   Alcohol Screen    Last Alcohol Screening Score (AUDIT): 5  Housing: Low Risk (06/23/2024)   Epic    Unable to Pay for Housing in the Last Year: No    Number of Times Moved in the Last Year: 0    Homeless in the Last Year: No  Utilities: Not At Risk (02/28/2024)   Epic    Threatened with loss of utilities: No  Health Literacy: Adequate Health Literacy (02/28/2024)   B1300 Health Literacy    Frequency of need for help with medical instructions: Never    FAMILY HISTORY: Family History  Problem Relation Age of Onset   Ovarian cancer Mother    Alcoholism Father    Breast cancer Sister    Ovarian cancer Sister     ALLERGIES:  is allergic to zithromax  [azithromycin ] and chlorpheniramine.  MEDICATIONS:  Current Outpatient Medications  Medication Sig Dispense Refill   acetaminophen  (TYLENOL ) 500 MG tablet Take 500 mg by mouth every 6 (six) hours as needed (for pain.).     cetirizine  (ZYRTEC ) 10 MG tablet Take 1 tablet (10 mg total) by mouth daily. 30 tablet 11   cholecalciferol  (VITAMIN D ) 25 MCG (1000 UNIT) tablet Take 1,000 Units by mouth 2 (two) times daily.     diltiazem  (TIAZAC ) 120 MG 24 hr capsule Take 1 capsule by mouth once daily 90 capsule 0   Fluticasone -Umeclidin-Vilant (TRELEGY ELLIPTA ) 100-62.5-25 MCG/ACT AEPB Inhale 1 puff into the lungs daily. 60 each 5   metoprolol  tartrate (LOPRESSOR ) 25 MG tablet Take 1 tablet by mouth twice daily as needed 180 tablet 0   montelukast  (SINGULAIR ) 10 MG tablet Take 1 tablet (10 mg total) by mouth at bedtime. 30 tablet 11   rivaroxaban  (XARELTO ) 20 MG TABS tablet Take 1 tablet (20 mg total) by mouth daily with supper. 90 tablet 1   rosuvastatin  (CRESTOR ) 20 MG tablet Take 1 tablet (20 mg total) by mouth daily. 90 tablet 1   sodium chloride  (OCEAN) 0.65 %  SOLN nasal spray Place 1 spray into both nostrils as needed for congestion.     valsartan  (DIOVAN ) 80 MG tablet Take 1 tablet (80 mg total) by mouth daily. 90 tablet 3   No current facility-administered medications for this visit.     PHYSICAL EXAMINATION: ECOG PERFORMANCE STATUS: 1 -  Symptomatic but completely ambulatory  Vitals:   08/04/24 1000  BP: 130/82  Pulse: 65  Resp: 18  Temp: 98.4 F (36.9 C)  SpO2: 98%     Filed Weights   08/04/24 1000  Weight: 214 lb 8 oz (97.3 kg)    Physical Exam Constitutional:      Appearance: Normal appearance.  Cardiovascular:     Rate and Rhythm: Normal rate and regular rhythm.  Pulmonary:     Effort: Pulmonary effort is normal.     Breath sounds: Normal breath sounds.  Abdominal:     General: Abdomen is flat.     Palpations: Abdomen is soft.  Musculoskeletal:        General: No swelling. Normal range of motion.     Cervical back: Normal range of motion and neck supple. No rigidity.  Lymphadenopathy:     Cervical: No cervical adenopathy.  Skin:    General: Skin is warm and dry.  Neurological:     General: No focal deficit present.     Mental Status: He is alert.       LABORATORY DATA:  I have reviewed the data as listed Lab Results  Component Value Date   WBC 5.5 08/04/2024   HGB 14.7 08/04/2024   HCT 43.0 08/04/2024   MCV 97.9 08/04/2024   PLT 155 08/04/2024     Chemistry      Component Value Date/Time   NA 136 04/15/2024 0903   NA 141 05/16/2018 1425   K 4.5 04/15/2024 0903   CL 102 04/15/2024 0903   CO2 28 04/15/2024 0903   BUN 17 04/15/2024 0903   BUN 13 05/16/2018 1425   CREATININE 0.95 04/15/2024 0903   CREATININE 0.91 09/17/2023 1332      Component Value Date/Time   CALCIUM  9.5 04/15/2024 0903   ALKPHOS 35 (L) 04/15/2024 0903   AST 14 04/15/2024 0903   AST 14 (L) 09/17/2023 1332   ALT 15 04/15/2024 0903   ALT 14 09/17/2023 1332   BILITOT 0.6 04/15/2024 0903   BILITOT 0.4 09/17/2023 1332        RADIOGRAPHIC STUDIES: I have personally reviewed the radiological images as listed and agreed with the findings in the report. No results found.    ASSESSMENT AND PLAN: Assessment and Plan Assessment & Plan  Lymphoproliferative disorder Multiple biopsies couldn't establish the exact type Most recent CBC with no concerns No Concerns on ROS or PE today.  Pulmonary nodules under surveillance Most recent CT scan with 2 solid non calcified nodules in the left lung upper lobe with largest nodule measuring upto 6 *6 mm essentially similar to the prior study. Repeat CT to be scheduled in March  Laryngeal cancer under surveillance Completed treatment in Dec 2023 FU with Dr Cory as scheduled in Feb  I have spent a total of 30 minutes minutes of face-to-face and non-face-to-face time, preparing to see the patient, performing a medically appropriate examination, counseling and educating the patient, documenting clinical information in the electronic health record, and care coordination.   Amber Stalls MD

## 2024-08-11 ENCOUNTER — Other Ambulatory Visit (HOSPITAL_COMMUNITY): Payer: Self-pay

## 2024-11-03 ENCOUNTER — Inpatient Hospital Stay: Payer: Medicare (Managed Care) | Attending: Hematology and Oncology

## 2024-11-03 ENCOUNTER — Ambulatory Visit: Payer: Medicare (Managed Care) | Admitting: Radiation Oncology

## 2024-11-03 ENCOUNTER — Ambulatory Visit: Payer: Self-pay | Admitting: Radiology

## 2024-11-03 ENCOUNTER — Inpatient Hospital Stay: Payer: Medicare (Managed Care) | Admitting: Hematology and Oncology

## 2024-11-16 ENCOUNTER — Ambulatory Visit: Payer: Medicare (Managed Care)

## 2024-11-16 ENCOUNTER — Ambulatory Visit (HOSPITAL_COMMUNITY): Payer: Medicare (Managed Care)

## 2025-03-05 ENCOUNTER — Ambulatory Visit: Payer: Medicare (Managed Care)
